# Patient Record
Sex: Male | Born: 1935 | Race: White | Hispanic: No | Smoking: Never smoker
Health system: Southern US, Community
[De-identification: ages and names within clinical notes are randomized; demographics above are authoritative.]

## PROBLEM LIST (undated history)

## (undated) DIAGNOSIS — E1149 Type 2 diabetes mellitus with other diabetic neurological complication: Secondary | ICD-10-CM

## (undated) DIAGNOSIS — I639 Cerebral infarction, unspecified: Secondary | ICD-10-CM

## (undated) DIAGNOSIS — I339 Acute and subacute endocarditis, unspecified: Secondary | ICD-10-CM

## (undated) DIAGNOSIS — A419 Sepsis, unspecified organism: Secondary | ICD-10-CM

## (undated) DIAGNOSIS — I2510 Atherosclerotic heart disease of native coronary artery without angina pectoris: Secondary | ICD-10-CM

## (undated) DIAGNOSIS — I679 Cerebrovascular disease, unspecified: Secondary | ICD-10-CM

## (undated) DIAGNOSIS — I509 Heart failure, unspecified: Secondary | ICD-10-CM

## (undated) DIAGNOSIS — M542 Cervicalgia: Secondary | ICD-10-CM

## (undated) DIAGNOSIS — I330 Acute and subacute infective endocarditis: Secondary | ICD-10-CM

## (undated) DIAGNOSIS — M4802 Spinal stenosis, cervical region: Secondary | ICD-10-CM

## (undated) DIAGNOSIS — K635 Polyp of colon: Secondary | ICD-10-CM

## (undated) DIAGNOSIS — F0390 Unspecified dementia without behavioral disturbance: Secondary | ICD-10-CM

## (undated) DIAGNOSIS — I10 Essential (primary) hypertension: Secondary | ICD-10-CM

## (undated) DIAGNOSIS — E119 Type 2 diabetes mellitus without complications: Secondary | ICD-10-CM

## (undated) DIAGNOSIS — K219 Gastro-esophageal reflux disease without esophagitis: Secondary | ICD-10-CM

## (undated) DIAGNOSIS — F329 Major depressive disorder, single episode, unspecified: Secondary | ICD-10-CM

## (undated) DIAGNOSIS — E611 Iron deficiency: Secondary | ICD-10-CM

## (undated) DIAGNOSIS — I4891 Unspecified atrial fibrillation: Secondary | ICD-10-CM

## (undated) DIAGNOSIS — N183 Chronic kidney disease, stage 3 unspecified: Secondary | ICD-10-CM

## (undated) DIAGNOSIS — B029 Zoster without complications: Secondary | ICD-10-CM

## (undated) DIAGNOSIS — K8050 Calculus of bile duct without cholangitis or cholecystitis without obstruction: Secondary | ICD-10-CM

## (undated) DIAGNOSIS — J449 Chronic obstructive pulmonary disease, unspecified: Secondary | ICD-10-CM

## (undated) DIAGNOSIS — Z8679 Personal history of other diseases of the circulatory system: Secondary | ICD-10-CM

## (undated) DIAGNOSIS — N433 Hydrocele, unspecified: Secondary | ICD-10-CM

## (undated) DIAGNOSIS — E785 Hyperlipidemia, unspecified: Secondary | ICD-10-CM

## (undated) DIAGNOSIS — E7800 Pure hypercholesterolemia, unspecified: Secondary | ICD-10-CM

## (undated) HISTORY — DX: Chronic obstructive pulmonary disease, unspecified: J44.9

## (undated) HISTORY — DX: Sepsis, unspecified organism: A41.9

## (undated) HISTORY — DX: Cervicalgia: M54.2

## (undated) HISTORY — DX: Zoster without complications: B02.9

## (undated) HISTORY — DX: Spinal stenosis, cervical region: M48.02

## (undated) HISTORY — DX: Polyp of colon: K63.5

## (undated) HISTORY — DX: Personal history of other diseases of the circulatory system: Z86.79

## (undated) HISTORY — DX: Essential (primary) hypertension: I10

## (undated) HISTORY — DX: Cerebral infarction, unspecified: I63.9

## (undated) HISTORY — DX: Gastro-esophageal reflux disease without esophagitis: K21.9

## (undated) HISTORY — DX: Type 2 diabetes mellitus with other diabetic neurological complication: E11.49

## (undated) HISTORY — DX: Pure hypercholesterolemia, unspecified: E78.00

## (undated) HISTORY — PX: HYDROCELE EXCISION: SHX482

## (undated) HISTORY — PX: CATARACT EXTRACTION: SUR2

## (undated) HISTORY — DX: Acute and subacute endocarditis, unspecified: I33.9

## (undated) HISTORY — DX: Chronic kidney disease, stage 3 unspecified: N18.30

## (undated) HISTORY — DX: Atherosclerotic heart disease of native coronary artery without angina pectoris: I25.10

## (undated) HISTORY — PX: CARDIAC SURGERY: SHX584

## (undated) HISTORY — DX: Calculus of bile duct without cholangitis or cholecystitis without obstruction: K80.50

## (undated) HISTORY — DX: Hydrocele, unspecified: N43.3

## (undated) HISTORY — DX: Cerebrovascular disease, unspecified: I67.9

---
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
## 1993-10-20 HISTORY — PX: CHOLECYSTECTOMY: SHX55

## 1995-10-21 HISTORY — PX: COLONOSCOPY: SHX174

## 1998-09-18 ENCOUNTER — Encounter: Payer: Self-pay | Admitting: Emergency Medicine

## 1998-09-18 ENCOUNTER — Inpatient Hospital Stay (HOSPITAL_COMMUNITY): Admission: EM | Admit: 1998-09-18 | Discharge: 1998-09-21 | Payer: Self-pay | Admitting: Emergency Medicine

## 1998-09-19 HISTORY — PX: PTCA: SHX146

## 1999-02-01 ENCOUNTER — Encounter: Admission: RE | Admit: 1999-02-01 | Discharge: 1999-02-01 | Payer: Self-pay | Admitting: Family Medicine

## 1999-08-14 ENCOUNTER — Encounter: Payer: Self-pay | Admitting: Cardiology

## 1999-08-14 ENCOUNTER — Ambulatory Visit (HOSPITAL_COMMUNITY): Admission: RE | Admit: 1999-08-14 | Discharge: 1999-08-14 | Payer: Self-pay | Admitting: Cardiology

## 2000-02-17 ENCOUNTER — Encounter: Admission: RE | Admit: 2000-02-17 | Discharge: 2000-02-17 | Payer: Self-pay | Admitting: Pediatrics

## 2000-04-29 ENCOUNTER — Emergency Department (HOSPITAL_COMMUNITY): Admission: EM | Admit: 2000-04-29 | Discharge: 2000-04-29 | Payer: Self-pay | Admitting: Emergency Medicine

## 2000-05-14 ENCOUNTER — Emergency Department (HOSPITAL_COMMUNITY): Admission: EM | Admit: 2000-05-14 | Discharge: 2000-05-14 | Payer: Self-pay | Admitting: Emergency Medicine

## 2000-11-04 ENCOUNTER — Encounter: Admission: RE | Admit: 2000-11-04 | Discharge: 2000-11-04 | Payer: Self-pay | Admitting: Family Medicine

## 2000-11-26 ENCOUNTER — Encounter: Payer: Self-pay | Admitting: Emergency Medicine

## 2000-11-26 ENCOUNTER — Inpatient Hospital Stay (HOSPITAL_COMMUNITY): Admission: EM | Admit: 2000-11-26 | Discharge: 2000-12-01 | Payer: Self-pay | Admitting: Emergency Medicine

## 2000-11-28 ENCOUNTER — Encounter: Payer: Self-pay | Admitting: Family Medicine

## 2000-11-30 ENCOUNTER — Encounter: Payer: Self-pay | Admitting: Family Medicine

## 2001-03-12 ENCOUNTER — Ambulatory Visit (HOSPITAL_COMMUNITY): Admission: RE | Admit: 2001-03-12 | Discharge: 2001-03-13 | Payer: Self-pay | Admitting: Cardiology

## 2001-04-29 ENCOUNTER — Encounter: Admission: RE | Admit: 2001-04-29 | Discharge: 2001-04-29 | Payer: Self-pay | Admitting: Family Medicine

## 2002-02-28 ENCOUNTER — Encounter: Admission: RE | Admit: 2002-02-28 | Discharge: 2002-02-28 | Payer: Self-pay | Admitting: Family Medicine

## 2002-11-21 ENCOUNTER — Encounter: Admission: RE | Admit: 2002-11-21 | Discharge: 2002-11-21 | Payer: Self-pay | Admitting: Family Medicine

## 2003-03-27 ENCOUNTER — Encounter: Admission: RE | Admit: 2003-03-27 | Discharge: 2003-03-27 | Payer: Self-pay | Admitting: Sports Medicine

## 2003-05-04 ENCOUNTER — Encounter: Payer: Self-pay | Admitting: Emergency Medicine

## 2003-05-04 ENCOUNTER — Inpatient Hospital Stay (HOSPITAL_COMMUNITY): Admission: EM | Admit: 2003-05-04 | Discharge: 2003-05-05 | Payer: Self-pay | Admitting: Emergency Medicine

## 2003-05-05 ENCOUNTER — Encounter: Payer: Self-pay | Admitting: Cardiology

## 2003-09-20 HISTORY — PX: OTHER SURGICAL HISTORY: SHX169

## 2003-10-05 ENCOUNTER — Inpatient Hospital Stay (HOSPITAL_COMMUNITY): Admission: EM | Admit: 2003-10-05 | Discharge: 2003-10-11 | Payer: Self-pay | Admitting: Emergency Medicine

## 2003-10-24 ENCOUNTER — Encounter: Admission: RE | Admit: 2003-10-24 | Discharge: 2003-10-24 | Payer: Self-pay | Admitting: Sports Medicine

## 2003-12-28 ENCOUNTER — Encounter: Admission: RE | Admit: 2003-12-28 | Discharge: 2003-12-28 | Payer: Self-pay | Admitting: Family Medicine

## 2004-02-01 ENCOUNTER — Encounter: Admission: RE | Admit: 2004-02-01 | Discharge: 2004-02-01 | Payer: Self-pay | Admitting: Family Medicine

## 2004-04-15 ENCOUNTER — Encounter: Admission: RE | Admit: 2004-04-15 | Discharge: 2004-04-15 | Payer: Self-pay | Admitting: Family Medicine

## 2004-04-23 ENCOUNTER — Encounter: Admission: RE | Admit: 2004-04-23 | Discharge: 2004-04-23 | Payer: Self-pay

## 2004-06-05 ENCOUNTER — Encounter: Admission: RE | Admit: 2004-06-05 | Discharge: 2004-06-05 | Payer: Self-pay | Admitting: Family Medicine

## 2004-10-01 ENCOUNTER — Ambulatory Visit: Payer: Self-pay

## 2005-04-04 ENCOUNTER — Ambulatory Visit: Payer: Self-pay | Admitting: Family Medicine

## 2005-06-10 ENCOUNTER — Ambulatory Visit: Payer: Self-pay | Admitting: Sports Medicine

## 2005-12-12 ENCOUNTER — Ambulatory Visit: Payer: Self-pay | Admitting: Family Medicine

## 2005-12-25 ENCOUNTER — Ambulatory Visit: Payer: Self-pay | Admitting: Family Medicine

## 2006-04-28 ENCOUNTER — Ambulatory Visit: Payer: Self-pay | Admitting: Cardiovascular Disease

## 2006-04-28 ENCOUNTER — Emergency Department (HOSPITAL_COMMUNITY): Admission: EM | Admit: 2006-04-28 | Discharge: 2006-04-28 | Payer: Self-pay | Admitting: Emergency Medicine

## 2006-05-01 ENCOUNTER — Encounter: Admission: RE | Admit: 2006-05-01 | Discharge: 2006-05-01 | Payer: Self-pay | Admitting: Neurosurgery

## 2006-05-07 ENCOUNTER — Ambulatory Visit: Payer: Self-pay

## 2006-05-07 ENCOUNTER — Encounter: Payer: Self-pay | Admitting: Internal Medicine

## 2006-05-19 ENCOUNTER — Ambulatory Visit: Payer: Self-pay | Admitting: Cardiology

## 2006-09-21 ENCOUNTER — Ambulatory Visit: Payer: Self-pay | Admitting: Sports Medicine

## 2006-09-28 ENCOUNTER — Ambulatory Visit: Payer: Self-pay | Admitting: Family Medicine

## 2006-12-17 DIAGNOSIS — E7800 Pure hypercholesterolemia, unspecified: Secondary | ICD-10-CM

## 2006-12-17 DIAGNOSIS — E118 Type 2 diabetes mellitus with unspecified complications: Secondary | ICD-10-CM

## 2006-12-17 DIAGNOSIS — I10 Essential (primary) hypertension: Secondary | ICD-10-CM | POA: Insufficient documentation

## 2006-12-17 DIAGNOSIS — E1140 Type 2 diabetes mellitus with diabetic neuropathy, unspecified: Secondary | ICD-10-CM

## 2006-12-17 DIAGNOSIS — Z794 Long term (current) use of insulin: Secondary | ICD-10-CM

## 2007-03-29 ENCOUNTER — Ambulatory Visit: Payer: Self-pay | Admitting: Family Medicine

## 2007-03-29 LAB — CONVERTED CEMR LAB: Hgb A1c MFr Bld: 7 %

## 2007-05-13 ENCOUNTER — Ambulatory Visit: Payer: Self-pay | Admitting: Cardiology

## 2007-05-26 ENCOUNTER — Encounter: Payer: Self-pay | Admitting: Cardiology

## 2007-05-26 ENCOUNTER — Ambulatory Visit: Payer: Self-pay | Admitting: Cardiology

## 2007-05-26 ENCOUNTER — Ambulatory Visit: Payer: Self-pay

## 2007-05-26 LAB — CONVERTED CEMR LAB
ALT: 15 units/L (ref 0–53)
AST: 23 units/L (ref 0–37)
Albumin: 3.8 g/dL (ref 3.5–5.2)
Alkaline Phosphatase: 49 units/L (ref 39–117)
BUN: 19 mg/dL (ref 6–23)
Bilirubin, Direct: 0.1 mg/dL (ref 0.0–0.3)
CO2: 28 meq/L (ref 19–32)
Calcium: 9.2 mg/dL (ref 8.4–10.5)
Chloride: 111 meq/L (ref 96–112)
Cholesterol: 118 mg/dL (ref 0–200)
Creatinine, Ser: 1.1 mg/dL (ref 0.4–1.5)
GFR calc Af Amer: 83 mL/min
GFR calc non Af Amer: 69 mL/min
Glucose, Bld: 104 mg/dL — ABNORMAL HIGH (ref 70–99)
HDL: 33.4 mg/dL — ABNORMAL LOW (ref >39.0)
LDL Cholesterol: 61 mg/dL (ref 0–99)
Potassium: 4.4 meq/L (ref 3.5–5.1)
Sodium: 144 meq/L (ref 135–145)
Total Bilirubin: 0.9 mg/dL (ref 0.3–1.2)
Total CHOL/HDL Ratio: 3.5
Total Protein: 6.7 g/dL (ref 6.0–8.3)
Triglycerides: 120 mg/dL (ref 0–149)
VLDL: 24 mg/dL (ref 0–40)

## 2007-05-31 ENCOUNTER — Encounter (INDEPENDENT_AMBULATORY_CARE_PROVIDER_SITE_OTHER): Payer: Self-pay | Admitting: *Deleted

## 2007-08-24 ENCOUNTER — Telehealth (INDEPENDENT_AMBULATORY_CARE_PROVIDER_SITE_OTHER): Payer: Self-pay | Admitting: *Deleted

## 2007-09-07 ENCOUNTER — Telehealth (INDEPENDENT_AMBULATORY_CARE_PROVIDER_SITE_OTHER): Payer: Self-pay | Admitting: *Deleted

## 2007-09-22 ENCOUNTER — Encounter (INDEPENDENT_AMBULATORY_CARE_PROVIDER_SITE_OTHER): Payer: Self-pay | Admitting: *Deleted

## 2007-09-22 ENCOUNTER — Ambulatory Visit: Payer: Self-pay | Admitting: Family Medicine

## 2007-09-22 LAB — CONVERTED CEMR LAB: Hgb A1c MFr Bld: 6.7 %

## 2007-09-23 LAB — CONVERTED CEMR LAB: Direct LDL: 106 mg/dL — ABNORMAL HIGH

## 2007-12-24 ENCOUNTER — Telehealth (INDEPENDENT_AMBULATORY_CARE_PROVIDER_SITE_OTHER): Payer: Self-pay | Admitting: *Deleted

## 2008-01-07 ENCOUNTER — Encounter (INDEPENDENT_AMBULATORY_CARE_PROVIDER_SITE_OTHER): Payer: Self-pay | Admitting: *Deleted

## 2008-01-07 ENCOUNTER — Ambulatory Visit: Payer: Self-pay | Admitting: Family Medicine

## 2008-01-07 LAB — CONVERTED CEMR LAB
ALT: 13 units/L (ref 0–53)
AST: 20 units/L (ref 0–37)
Albumin: 4.4 g/dL (ref 3.5–5.2)
Alkaline Phosphatase: 57 units/L (ref 39–117)
BUN: 15 mg/dL (ref 6–23)
CO2: 24 meq/L (ref 19–32)
Calcium: 9.6 mg/dL (ref 8.4–10.5)
Chloride: 106 meq/L (ref 96–112)
Creatinine, Ser: 1.03 mg/dL (ref 0.40–1.50)
Direct LDL: 64 mg/dL
Glucose, Bld: 115 mg/dL — ABNORMAL HIGH (ref 70–99)
Hgb A1c MFr Bld: 6.9 %
Potassium: 4.3 meq/L (ref 3.5–5.3)
Sodium: 141 meq/L (ref 135–145)
Total Bilirubin: 0.9 mg/dL (ref 0.3–1.2)
Total Protein: 7.3 g/dL (ref 6.0–8.3)

## 2008-01-10 ENCOUNTER — Encounter (INDEPENDENT_AMBULATORY_CARE_PROVIDER_SITE_OTHER): Payer: Self-pay | Admitting: *Deleted

## 2008-01-19 ENCOUNTER — Ambulatory Visit: Payer: Self-pay | Admitting: Family Medicine

## 2008-02-22 ENCOUNTER — Ambulatory Visit: Payer: Self-pay | Admitting: Family Medicine

## 2008-03-15 ENCOUNTER — Encounter (INDEPENDENT_AMBULATORY_CARE_PROVIDER_SITE_OTHER): Payer: Self-pay | Admitting: *Deleted

## 2008-05-05 ENCOUNTER — Ambulatory Visit: Payer: Self-pay | Admitting: Cardiology

## 2008-05-18 ENCOUNTER — Ambulatory Visit: Payer: Self-pay

## 2008-05-18 ENCOUNTER — Encounter: Payer: Self-pay | Admitting: Cardiology

## 2008-05-18 ENCOUNTER — Ambulatory Visit: Payer: Self-pay | Admitting: Cardiology

## 2008-05-18 LAB — CONVERTED CEMR LAB
ALT: 15 units/L (ref 0–53)
AST: 21 units/L (ref 0–37)
Albumin: 4 g/dL (ref 3.5–5.2)
Alkaline Phosphatase: 49 units/L (ref 39–117)
BUN: 17 mg/dL (ref 6–23)
Bilirubin, Direct: 0.1 mg/dL (ref 0.0–0.3)
CO2: 27 meq/L (ref 19–32)
Calcium: 9.1 mg/dL (ref 8.4–10.5)
Chloride: 105 meq/L (ref 96–112)
Cholesterol: 107 mg/dL (ref 0–200)
Creatinine, Ser: 1.1 mg/dL (ref 0.4–1.5)
GFR calc Af Amer: 83 mL/min
GFR calc non Af Amer: 69 mL/min
Glucose, Bld: 109 mg/dL — ABNORMAL HIGH (ref 70–99)
HDL: 32.9 mg/dL — ABNORMAL LOW (ref >39.0)
LDL Cholesterol: 50 mg/dL (ref 0–99)
Potassium: 4.4 meq/L (ref 3.5–5.1)
Sodium: 139 meq/L (ref 135–145)
Total Bilirubin: 0.9 mg/dL (ref 0.3–1.2)
Total CHOL/HDL Ratio: 3.3
Total Protein: 7 g/dL (ref 6.0–8.3)
Triglycerides: 122 mg/dL (ref 0–149)
VLDL: 24 mg/dL (ref 0–40)

## 2008-05-26 ENCOUNTER — Ambulatory Visit: Payer: Self-pay | Admitting: Cardiology

## 2008-06-22 ENCOUNTER — Ambulatory Visit: Payer: Self-pay | Admitting: Family Medicine

## 2008-06-22 ENCOUNTER — Encounter: Payer: Self-pay | Admitting: Family Medicine

## 2008-06-22 LAB — CONVERTED CEMR LAB
ALT: 13 units/L (ref 0–53)
AST: 18 units/L (ref 0–37)
Albumin: 4.4 g/dL (ref 3.5–5.2)
Alkaline Phosphatase: 57 units/L (ref 39–117)
BUN: 15 mg/dL (ref 6–23)
CO2: 24 meq/L (ref 19–32)
Calcium: 9.2 mg/dL (ref 8.4–10.5)
Chloride: 104 meq/L (ref 96–112)
Creatinine, Ser: 1.33 mg/dL (ref 0.40–1.50)
Glucose, Bld: 161 mg/dL — ABNORMAL HIGH (ref 70–99)
Hgb A1c MFr Bld: 6.9 %
Potassium: 4.3 meq/L (ref 3.5–5.3)
Sodium: 140 meq/L (ref 135–145)
Total Bilirubin: 0.7 mg/dL (ref 0.3–1.2)
Total Protein: 7.3 g/dL (ref 6.0–8.3)

## 2008-06-23 ENCOUNTER — Encounter: Payer: Self-pay | Admitting: Family Medicine

## 2008-09-12 ENCOUNTER — Encounter: Payer: Self-pay | Admitting: Family Medicine

## 2008-09-21 ENCOUNTER — Ambulatory Visit: Payer: Self-pay | Admitting: Family Medicine

## 2008-09-21 LAB — CONVERTED CEMR LAB: Hgb A1c MFr Bld: 6.9 %

## 2008-11-16 ENCOUNTER — Encounter: Payer: Self-pay | Admitting: Cardiology

## 2008-11-16 ENCOUNTER — Ambulatory Visit: Payer: Self-pay

## 2008-11-16 ENCOUNTER — Ambulatory Visit: Payer: Self-pay | Admitting: Cardiology

## 2008-11-22 ENCOUNTER — Ambulatory Visit: Payer: Self-pay | Admitting: Cardiology

## 2008-11-27 ENCOUNTER — Ambulatory Visit: Payer: Self-pay | Admitting: Vascular Surgery

## 2008-11-27 ENCOUNTER — Inpatient Hospital Stay (HOSPITAL_BASED_OUTPATIENT_CLINIC_OR_DEPARTMENT_OTHER): Admission: RE | Admit: 2008-11-27 | Discharge: 2008-11-27 | Payer: Self-pay | Admitting: Internal Medicine

## 2008-11-27 ENCOUNTER — Ambulatory Visit: Payer: Self-pay | Admitting: Internal Medicine

## 2008-11-27 ENCOUNTER — Encounter: Payer: Self-pay | Admitting: Internal Medicine

## 2008-11-27 ENCOUNTER — Ambulatory Visit (HOSPITAL_COMMUNITY): Admission: RE | Admit: 2008-11-27 | Discharge: 2008-11-27 | Payer: Self-pay | Admitting: Internal Medicine

## 2008-12-04 ENCOUNTER — Ambulatory Visit: Payer: Self-pay | Admitting: Thoracic Surgery (Cardiothoracic Vascular Surgery)

## 2008-12-07 ENCOUNTER — Ambulatory Visit: Payer: Self-pay | Admitting: Cardiology

## 2008-12-07 ENCOUNTER — Ambulatory Visit: Payer: Self-pay

## 2008-12-11 ENCOUNTER — Encounter: Payer: Self-pay | Admitting: Family Medicine

## 2008-12-18 ENCOUNTER — Ambulatory Visit: Payer: Self-pay | Admitting: Thoracic Surgery (Cardiothoracic Vascular Surgery)

## 2008-12-18 HISTORY — PX: CORONARY ARTERY BYPASS GRAFT: SHX141

## 2008-12-18 HISTORY — PX: AORTIC VALVE REPLACEMENT: SHX41

## 2008-12-22 ENCOUNTER — Ambulatory Visit: Payer: Self-pay | Admitting: Thoracic Surgery (Cardiothoracic Vascular Surgery)

## 2008-12-22 ENCOUNTER — Encounter: Payer: Self-pay | Admitting: Thoracic Surgery (Cardiothoracic Vascular Surgery)

## 2008-12-22 ENCOUNTER — Inpatient Hospital Stay (HOSPITAL_COMMUNITY)
Admission: RE | Admit: 2008-12-22 | Discharge: 2008-12-28 | Payer: Self-pay | Admitting: Thoracic Surgery (Cardiothoracic Vascular Surgery)

## 2008-12-22 ENCOUNTER — Ambulatory Visit: Payer: Self-pay | Admitting: Cardiology

## 2009-01-08 ENCOUNTER — Ambulatory Visit: Payer: Self-pay | Admitting: Cardiology

## 2009-01-08 ENCOUNTER — Encounter: Payer: Self-pay | Admitting: *Deleted

## 2009-01-22 ENCOUNTER — Ambulatory Visit: Payer: Self-pay | Admitting: Thoracic Surgery (Cardiothoracic Vascular Surgery)

## 2009-01-22 ENCOUNTER — Ambulatory Visit: Payer: Self-pay

## 2009-01-22 ENCOUNTER — Encounter: Admission: RE | Admit: 2009-01-22 | Discharge: 2009-01-22 | Payer: Self-pay | Admitting: *Deleted

## 2009-01-22 ENCOUNTER — Encounter: Payer: Self-pay | Admitting: Cardiology

## 2009-02-28 DIAGNOSIS — Z8673 Personal history of transient ischemic attack (TIA), and cerebral infarction without residual deficits: Secondary | ICD-10-CM

## 2009-03-06 ENCOUNTER — Ambulatory Visit: Payer: Self-pay | Admitting: Cardiology

## 2009-03-08 ENCOUNTER — Ambulatory Visit: Payer: Self-pay | Admitting: Cardiology

## 2009-03-08 LAB — CONVERTED CEMR LAB
ALT: 13 units/L (ref 0–53)
AST: 23 units/L (ref 0–37)
Albumin: 3.8 g/dL (ref 3.5–5.2)
Alkaline Phosphatase: 53 units/L (ref 39–117)
Bilirubin, Direct: 0 mg/dL (ref 0.0–0.3)
Cholesterol: 123 mg/dL (ref 0–200)
HDL: 41.3 mg/dL (ref >39.00)
LDL Cholesterol: 59 mg/dL (ref 0–99)
Total Bilirubin: 0.7 mg/dL (ref 0.3–1.2)
Total CHOL/HDL Ratio: 3
Total Protein: 6.8 g/dL (ref 6.0–8.3)
Triglycerides: 112 mg/dL (ref 0.0–149.0)
VLDL: 22.4 mg/dL (ref 0.0–40.0)

## 2009-03-16 ENCOUNTER — Ambulatory Visit: Payer: Self-pay | Admitting: Family Medicine

## 2009-03-27 ENCOUNTER — Telehealth: Payer: Self-pay | Admitting: *Deleted

## 2009-03-28 ENCOUNTER — Telehealth (INDEPENDENT_AMBULATORY_CARE_PROVIDER_SITE_OTHER): Payer: Self-pay | Admitting: *Deleted

## 2009-05-17 ENCOUNTER — Ambulatory Visit: Payer: Self-pay

## 2009-05-17 ENCOUNTER — Encounter: Payer: Self-pay | Admitting: Cardiology

## 2009-06-14 ENCOUNTER — Encounter (INDEPENDENT_AMBULATORY_CARE_PROVIDER_SITE_OTHER): Payer: Self-pay | Admitting: *Deleted

## 2009-09-06 ENCOUNTER — Ambulatory Visit: Payer: Self-pay | Admitting: Cardiology

## 2009-09-12 ENCOUNTER — Ambulatory Visit: Payer: Self-pay | Admitting: Cardiology

## 2009-09-18 ENCOUNTER — Encounter (INDEPENDENT_AMBULATORY_CARE_PROVIDER_SITE_OTHER): Payer: Self-pay | Admitting: *Deleted

## 2009-09-18 LAB — CONVERTED CEMR LAB
BUN: 16 mg/dL (ref 6–23)
CO2: 25 meq/L (ref 19–32)
Calcium: 9 mg/dL (ref 8.4–10.5)
Chloride: 109 meq/L (ref 96–112)
Creatinine, Ser: 1.6 mg/dL — ABNORMAL HIGH (ref 0.4–1.5)
GFR calc non Af Amer: 44.34 mL/min (ref >60)
Glucose, Bld: 177 mg/dL — ABNORMAL HIGH (ref 70–99)
Potassium: 5.1 meq/L (ref 3.5–5.1)
Sodium: 140 meq/L (ref 135–145)

## 2009-10-20 HISTORY — PX: OTHER SURGICAL HISTORY: SHX169

## 2009-11-07 ENCOUNTER — Encounter: Payer: Self-pay | Admitting: Cardiology

## 2009-11-07 ENCOUNTER — Ambulatory Visit: Payer: Self-pay

## 2009-12-11 ENCOUNTER — Telehealth: Payer: Self-pay | Admitting: Family Medicine

## 2009-12-12 ENCOUNTER — Ambulatory Visit: Payer: Self-pay | Admitting: Family Medicine

## 2009-12-12 LAB — CONVERTED CEMR LAB: Hgb A1c MFr Bld: 6.3 %

## 2009-12-18 DIAGNOSIS — N433 Hydrocele, unspecified: Secondary | ICD-10-CM

## 2009-12-18 HISTORY — DX: Hydrocele, unspecified: N43.3

## 2009-12-19 ENCOUNTER — Encounter: Payer: Self-pay | Admitting: Family Medicine

## 2010-01-01 ENCOUNTER — Encounter: Payer: Self-pay | Admitting: Family Medicine

## 2010-01-10 DIAGNOSIS — N138 Other obstructive and reflux uropathy: Secondary | ICD-10-CM | POA: Insufficient documentation

## 2010-01-10 DIAGNOSIS — N401 Enlarged prostate with lower urinary tract symptoms: Secondary | ICD-10-CM

## 2010-08-28 ENCOUNTER — Encounter: Payer: Self-pay | Admitting: Family Medicine

## 2010-08-28 ENCOUNTER — Ambulatory Visit: Payer: Self-pay | Admitting: Family Medicine

## 2010-08-28 LAB — CONVERTED CEMR LAB
BUN: 18 mg/dL (ref 6–23)
CO2: 22 meq/L (ref 19–32)
Calcium: 9.1 mg/dL (ref 8.4–10.5)
Chloride: 105 meq/L (ref 96–112)
Creatinine, Ser: 1.74 mg/dL — ABNORMAL HIGH (ref 0.40–1.50)
Glucose, Bld: 205 mg/dL — ABNORMAL HIGH (ref 70–99)
Potassium: 4.8 meq/L (ref 3.5–5.3)
Sodium: 136 meq/L (ref 135–145)

## 2010-09-03 ENCOUNTER — Encounter: Payer: Self-pay | Admitting: Cardiology

## 2010-09-03 ENCOUNTER — Ambulatory Visit: Payer: Self-pay | Admitting: Family Medicine

## 2010-09-03 ENCOUNTER — Ambulatory Visit: Payer: Self-pay | Admitting: Cardiology

## 2010-09-04 ENCOUNTER — Encounter (INDEPENDENT_AMBULATORY_CARE_PROVIDER_SITE_OTHER): Payer: Self-pay | Admitting: *Deleted

## 2010-09-04 LAB — CONVERTED CEMR LAB
ALT: 14 units/L (ref 0–53)
AST: 23 units/L (ref 0–37)
Albumin: 4.3 g/dL (ref 3.5–5.2)
Alkaline Phosphatase: 62 units/L (ref 39–117)
BUN: 20 mg/dL (ref 6–23)
Bilirubin, Direct: 0.1 mg/dL (ref 0.0–0.3)
CO2: 23 meq/L (ref 19–32)
Calcium: 9.2 mg/dL (ref 8.4–10.5)
Chloride: 103 meq/L (ref 96–112)
Cholesterol: 135 mg/dL (ref 0–200)
Creatinine, Ser: 2 mg/dL — ABNORMAL HIGH (ref 0.4–1.5)
Direct LDL: 59.9 mg/dL
GFR calc non Af Amer: 34.39 mL/min (ref >60)
Glucose, Bld: 128 mg/dL — ABNORMAL HIGH (ref 70–99)
HDL: 34.9 mg/dL — ABNORMAL LOW (ref >39.00)
Potassium: 4.8 meq/L (ref 3.5–5.1)
Sodium: 133 meq/L — ABNORMAL LOW (ref 135–145)
Total Bilirubin: 0.9 mg/dL (ref 0.3–1.2)
Total CHOL/HDL Ratio: 4
Total Protein: 7.2 g/dL (ref 6.0–8.3)
Triglycerides: 352 mg/dL — ABNORMAL HIGH (ref 0.0–149.0)
VLDL: 70.4 mg/dL — ABNORMAL HIGH (ref 0.0–40.0)

## 2010-09-24 ENCOUNTER — Ambulatory Visit: Payer: Self-pay | Admitting: Family Medicine

## 2010-09-24 DIAGNOSIS — N183 Chronic kidney disease, stage 3 (moderate): Secondary | ICD-10-CM

## 2010-09-24 DIAGNOSIS — H6120 Impacted cerumen, unspecified ear: Secondary | ICD-10-CM

## 2010-09-26 LAB — CONVERTED CEMR LAB
BUN: 18 mg/dL (ref 6–23)
CO2: 26 meq/L (ref 19–32)
Calcium: 9.3 mg/dL (ref 8.4–10.5)
Chloride: 103 meq/L (ref 96–112)
Creatinine, Ser: 1.8 mg/dL — ABNORMAL HIGH (ref 0.4–1.5)
GFR calc non Af Amer: 39.1 mL/min — ABNORMAL LOW (ref >60.00)
Glucose, Bld: 185 mg/dL — ABNORMAL HIGH (ref 70–99)
Potassium: 5.1 meq/L (ref 3.5–5.1)
Sodium: 137 meq/L (ref 135–145)

## 2010-10-31 ENCOUNTER — Other Ambulatory Visit: Payer: Self-pay | Admitting: Family Medicine

## 2010-10-31 ENCOUNTER — Ambulatory Visit
Admission: RE | Admit: 2010-10-31 | Discharge: 2010-10-31 | Payer: Self-pay | Source: Home / Self Care | Attending: Family Medicine | Admitting: Family Medicine

## 2010-11-01 LAB — BASIC METABOLIC PANEL
BUN: 19 mg/dL (ref 6–23)
CO2: 25 mEq/L (ref 19–32)
Calcium: 9.3 mg/dL (ref 8.4–10.5)
Chloride: 104 mEq/L (ref 96–112)
Creatinine, Ser: 1.8 mg/dL — ABNORMAL HIGH (ref 0.4–1.5)
GFR: 37.86 mL/min — ABNORMAL LOW (ref >60.00)
Glucose, Bld: 202 mg/dL — ABNORMAL HIGH (ref 70–99)
Potassium: 5.4 mEq/L — ABNORMAL HIGH (ref 3.5–5.1)
Sodium: 137 mEq/L (ref 135–145)

## 2010-11-06 ENCOUNTER — Ambulatory Visit
Admission: RE | Admit: 2010-11-06 | Discharge: 2010-11-06 | Payer: Self-pay | Source: Home / Self Care | Attending: Family Medicine | Admitting: Family Medicine

## 2010-11-06 ENCOUNTER — Other Ambulatory Visit: Payer: Self-pay | Admitting: Family Medicine

## 2010-11-06 LAB — BASIC METABOLIC PANEL
BUN: 16 mg/dL (ref 6–23)
CO2: 24 mEq/L (ref 19–32)
Calcium: 9.2 mg/dL (ref 8.4–10.5)
Chloride: 105 mEq/L (ref 96–112)
Creatinine, Ser: 1.7 mg/dL — ABNORMAL HIGH (ref 0.4–1.5)
GFR: 40.13 mL/min — ABNORMAL LOW (ref >60.00)
Glucose, Bld: 216 mg/dL — ABNORMAL HIGH (ref 70–99)
Potassium: 4.8 mEq/L (ref 3.5–5.1)
Sodium: 137 mEq/L (ref 135–145)

## 2010-11-17 LAB — CONVERTED CEMR LAB
BUN: 19 mg/dL (ref 6–23)
Basophils Absolute: 0 10*3/uL (ref 0.0–0.1)
Basophils Relative: 0.2 % (ref 0.0–3.0)
CO2: 26 meq/L (ref 19–32)
Calcium: 9 mg/dL (ref 8.4–10.5)
Chloride: 107 meq/L (ref 96–112)
Creatinine, Ser: 1.3 mg/dL (ref 0.4–1.5)
Eosinophils Absolute: 0.3 10*3/uL (ref 0.0–0.7)
Eosinophils Relative: 4 % (ref 0.0–5.0)
GFR calc Af Amer: 68 mL/min
GFR calc non Af Amer: 57 mL/min
Glucose, Bld: 201 mg/dL — ABNORMAL HIGH (ref 70–99)
HCT: 37.7 % — ABNORMAL LOW (ref 39.0–52.0)
Hemoglobin: 13.1 g/dL (ref 13.0–17.0)
INR: 0.9 (ref 0.8–1.0)
Lymphocytes Relative: 15.8 % (ref 12.0–46.0)
MCHC: 34.7 g/dL (ref 30.0–36.0)
MCV: 90.3 fL (ref 78.0–100.0)
Monocytes Absolute: 0.6 10*3/uL (ref 0.1–1.0)
Monocytes Relative: 8.3 % (ref 3.0–12.0)
Neutro Abs: 5 10*3/uL (ref 1.4–7.7)
Neutrophils Relative %: 71.7 % (ref 43.0–77.0)
Platelets: 175 10*3/uL (ref 150–400)
Potassium: 4.4 meq/L (ref 3.5–5.1)
Prothrombin Time: 10.1 s — ABNORMAL LOW (ref 10.9–13.3)
RBC: 4.17 M/uL — ABNORMAL LOW (ref 4.22–5.81)
RDW: 12.1 % (ref 11.5–14.6)
Sodium: 138 meq/L (ref 135–145)
WBC: 7 10*3/uL (ref 4.5–10.5)

## 2010-11-19 ENCOUNTER — Telehealth: Payer: Self-pay | Admitting: Family Medicine

## 2010-11-19 NOTE — Assessment & Plan Note (Signed)
Summary: DM f/u/[place]/ta   Allergies: No Known Drug Allergies   Complete Medication List: 1)  Bd Insulin Syringe U-100 1 Ml Misc (Insulin syringes (disposable)) .... 90 day supply given 2)  Famotidine 20 Mg Tabs (Famotidine) .Marland Kitchen.. 1 tablet by mouth once a day 3)  Glucotrol Xl 10 Mg Tb24 (Glipizide) .... Take  tablet by mouth once a day 4)  Lantus 100 Unit/ml Soln (Insulin glargine) .... Inject 25 unit subcutaneously every morning 5)  Metformin Hcl 1000 Mg Tabs (Metformin hcl) .... Take 1 tablet by mouth twice a day 6)  Nitroglycerin 0.4 Mg Subl (Nitroglycerin) .... Place 1 tablet under tongue 7)  Metoprolol Tartrate 50 Mg Tabs (Metoprolol tartrate) .Marland Kitchen.. 1 tab two times a day 8)  Ramipril 5 Mg Caps (Ramipril) .... Take one capsule by mouth daily 9)  Simvastatin 20 Mg Tabs (Simvastatin) .... Sig take 1 tab by mouth once daily 10)  Aspirin 81 Mg Tbec (Aspirin) .... Take one tablet by mouth daily  Other Orders: Basic Met[facility] [phone]) A1C-[facility] (86578)   Orders Added: 1)  Basic [facility] [[id number]] 2)  A1C[facility] [[id number]]  Appended Document: DM f/u/[place]/ta Appt rescheduled for 09/03/10. BMET and A1C drawn. Will folowup labs at 11/15 visit.  Doree Albee MD  Appended Document: A1c results  Laboratory Results   Blood Tests   Date/Time Received: November  9, 20111:50 PM  Date/Time Reported: August 28, 2010 2:21 PM   HGBA1C: 6.2%   (Normal Range: Non-Diabetic - 3-6%   Control Diabetic - 6-8%)  Comments: ...........test performed by...........Marland KitchenTerese Door, CMA

## 2010-11-19 NOTE — Consult Note (Signed)
Summary: [facility]   Imported By: Clydell Hakim 05/16/2010 16:26:31  _____________________________________________________________________  External Attachment:    Type:   Image     Comment:   External Document

## 2010-11-19 NOTE — Letter (Signed)
Summary: [facility] - Lipid  [facility], Main Office  [address]   [place], [place] [address]   Phone: [phone]  Fax: [phone]     September 04, 2010 MRN: [id number]   [address] [id number]  [place], [place]  [address]   Dear Mr. Jastrzebski,  We have reviewed your cholesterol results.  They are as follows:     Total Cholesterol:    135 (Desirable: less than 200)       HDL  Cholesterol:     34.90  (Desirable: greater than 40 for men and 50 for women)       LDL Cholesterol:       59  (Desirable: less than 100 for low risk and less than 70 for moderate to high risk)       Triglycerides:       352.0  (Desirable: less than 150)  Our recommendations include:These numbers look good. Continue on the same medicine. Sodium, potassium, kidney and Liver function are normal. Take care, Dr. Darel Hong.    Call our office at the number listed above if you have any questions.  Lowering your LDL cholesterol is important, but it is only one of a large number of "risk factors" that may indicate that you are at risk for heart disease, stroke or other complications of hardening of the arteries.  Other risk factors include:   A.  Cigarette Smoking* B.  High Blood Pressure* C.  Obesity* D.   Low HDL Cholesterol (see yours above)* E.   Diabetes Mellitus (higher risk if your is uncontrolled) F.  Family history of premature heart disease G.  Previous history of stroke or cardiovascular disease    *These are risk factors YOU HAVE CONTROL OVER.  For more information, visit .  There is now evidence that lowering the TOTAL CHOLESTEROL AND LDL CHOLESTEROL can reduce the risk of heart disease.  The American Heart Association recommends the following guidelines for the treatment of elevated cholesterol:  1.  If there is now current heart disease and less than two risk factors, TOTAL CHOLESTEROL should be less than 200 and LDL CHOLESTEROL should be  less than 100. 2.  If there is current heart disease or two or more risk factors, TOTAL CHOLESTEROL should be less than 200 and LDL CHOLESTEROL should be less than 70.  A diet low in cholesterol, saturated fat, and calories is the cornerstone of treatment for elevated cholesterol.  Cessation of smoking and exercise are also important in the management of elevated cholesterol and preventing vascular disease.  Studies have shown that 30 to 60 minutes of physical activity most days can help lower blood pressure, lower cholesterol, and keep your weight at a healthy level.  Drug therapy is used when cholesterol levels do not respond to therapeutic lifestyle changes (smoking cessation, diet, and exercise) and remains unacceptably high.  If medication is started, it is important to have you levels checked periodically to evaluate the need for further treatment options.  Thank you,    [employer] Team

## 2010-11-19 NOTE — Consult Note (Signed)
Summary: Dr Magnus Ivan: b/l hydrocele  Dr Magnus Ivan   Imported By: Bradly Bienenstock 12/31/2009 16:33:24  _____________________________________________________________________  External Attachment:    Type:   Image     Comment:   External Document

## 2010-11-19 NOTE — Assessment & Plan Note (Signed)
Summary: sore on arm/eo  PT LEFT WITHOUT BEING SEEN.Tessie Fass CMA  September 03, 2010 4:23 PM  Vitals Entered By: Tessie Fass CMA (September 03, 2010 4:23 PM)  Allergies: No Known Drug Allergies   Complete Medication List: 1)  Bd Insulin Syringe U-100 1 Ml Misc (Insulin syringes (disposable)) .... 90 day supply given 2)  Famotidine 20 Mg Tabs (Famotidine) .Marland Kitchen.. 1 tablet by mouth once a day 3)  Glucotrol Xl 10 Mg Tb24 (Glipizide) .... Take  tablet by mouth once a day 4)  Lantus 100 Unit/ml Soln (Insulin glargine) .... Inject 25 unit subcutaneously every morning 5)  Metformin Hcl 1000 Mg Tabs (Metformin hcl) .... Take 1 tablet by mouth twice a day 6)  Nitroglycerin 0.4 Mg Subl (Nitroglycerin) .... Place 1 tablet under tongue 7)  Altace 2.5 Mg Caps (Ramipril) .Marland Kitchen.. 1 tab by mouth once daily 8)  Aspirin 81 Mg Tbec (Aspirin) .... Take one tablet by mouth daily 9)  Simvastatin 40 Mg Tabs (Simvastatin) .... Take one tablet by mouth daily at bedtime  Other Orders: No Charge Patient Arrived (NCPA0) (NCPA0)   Orders Added: 1)  No Charge Patient Arrived (NCPA0) [NCPA0]  Appended Document: sore on arm/eo Pt left before being seen for appt.  Doree Albee MD

## 2010-11-19 NOTE — Miscellaneous (Signed)
Summary: Orders Update  Clinical Lists Changes  Orders: Added new Test order of Carotid Duplex (Carotid Duplex) - Signed 

## 2010-11-19 NOTE — Miscellaneous (Signed)
Summary: Changing Prob List    Clinical Lists Changes  Problems: Removed problem of INGUINAL HERNIA, LEFT (ICD-550.90) Removed problem of ENCOUNTER FOR LONG-TERM USE OF OTHER MEDICATIONS (ICD-V58.69) Removed problem of GRIEF REACTION, ACUTE (ICD-309.0) Removed problem of REFLUX ESOPHAGITIS (ICD-530.11) Removed problem of CHEST PAIN (ICD-786.50) Removed problem of HYDROCELES, BILATERAL (ICD-603.9) Removed problem of DYSPNEA (ICD-786.05) Removed problem of ANEMIA (ICD-285.9) Removed problem of AORTIC STENOSIS (ICD-424.1) Removed problem of AORTIC VALVE REPLACEMENT, HX OF (ICD-V43.3) Removed problem of ATRIAL ARRHYTHMIAS (ICD-427.89) Removed problem of CAROTID ARTERY NARROWING (ICD-433.10) Observations: Added new observation of PAST MED HX: Current Problems:  CORONARY, ARTERIOSCLEROSIS (ICD-414.00) CEREBROVASCULAR DISEASE (ICD-437.9) NEUROPATHY, DIABETIC (ICD-250.60) HYPERTENSION, BENIGN SYSTEMIC (ICD-401.1) HYPERCHOLESTEROLEMIA (ICD-272.0) DIABETES MELLITUS II, UNCOMPLICATED (ICD-250.00) GERD (ICD-530.81) Carotid Artery narrowing  BPH with urinary obstruction: [facility] 12/2009 Bilateral Hydrocele: [facility] 12/2009 History of aortic stenosis status post aortic valve replacement Cervicalgia - bil. Foramenal stenosis C4-C5, C5-C6  Quit tobacco 1996  (08/28/2010 20:04) Added new observation of PRIMARY MD: CAT TA MD (08/28/2010 20:04)       Past History:  Past Medical History: Current Problems:  CORONARY, ARTERIOSCLEROSIS (ICD-414.00) CEREBROVASCULAR DISEASE (ICD-437.9) NEUROPATHY, DIABETIC (ICD-250.60) HYPERTENSION, BENIGN SYSTEMIC (ICD-401.1) HYPERCHOLESTEROLEMIA (ICD-272.0) DIABETES MELLITUS II, UNCOMPLICATED (ICD-250.00) GERD (ICD-530.81) Carotid Artery narrowing  BPH with urinary obstruction: [facility] 12/2009 Bilateral Hydrocele: [facility] 12/2009 History of aortic stenosis status post aortic valve replacement Cervicalgia - bil.  Foramenal stenosis C4-C5, C5-C6  Quit tobacco 1996

## 2010-11-19 NOTE — Assessment & Plan Note (Signed)
Summary: hernia? per pt/[place]/Ta   Vital Signs:  Patient profile:   77 year old male Height:      69 inches Weight:      209.5 pounds Pulse rate:   66 / minute BP sitting:   138 / 76  (left arm) Cuff size:   large  Vitals Entered By: Arlyss Repress CMA, (December 12, 2009 10:49 AM) CC: swelling of scrotum off and on.  Is Patient Diabetic? Yes Pain Assessment Patient in pain? no        Primary Care Provider:  CAT TA MD  CC:  swelling of scrotum off and on. .  History of Present Illness: Patient presents with complaint of intermittent scrotal swelling, worse on the L than R.  Largely positional, worse when he stands up. No constipation, has daily soft formed BM.  No nausea or vomiting.   No dysuria or urinary hesitancy.   Patient surgical history reviewed in EMR, confirmed.   Current Medications (verified): 1)  Bd Insulin Syringe U-100 1 Ml  Misc (Insulin Syringes (Disposable)) .... 90 Day Supply Given 2)  Famotidine 20 Mg Tabs (Famotidine) .Marland Kitchen.. 1 Tablet By Mouth Once A Day 3)  Glucotrol Xl 10 Mg Tb24 (Glipizide) .... Take  Tablet By Mouth Once A Day 4)  Lantus 100 Unit/ml Soln (Insulin Glargine) .... Inject 25 Unit Subcutaneously Every Morning 5)  Metformin Hcl 1000 Mg Tabs (Metformin Hcl) .... Take 1 Tablet By Mouth Twice A Day 6)  Nitroglycerin 0.4 Mg Subl (Nitroglycerin) .... Place 1 Tablet Under Tongue 7)  Metoprolol Tartrate 50 Mg Tabs (Metoprolol Tartrate) .Marland Kitchen.. 1 Tab Two Times A Day 8)  Ramipril 5 Mg Caps (Ramipril) .... Take One Capsule By Mouth Daily 9)  Simvastatin 20 Mg Tabs (Simvastatin) .... Sig Take 1 Tab By Mouth Once Daily 10)  Aspirin 81 Mg Tbec (Aspirin) .... Take One Tablet By Mouth Daily  Allergies (verified): No Known Drug Allergies  Past History:  Past Medical History: Last updated: 09/06/2009 Current Problems:  CORONARY, ARTERIOSCLEROSIS (ICD-414.00) CEREBROVASCULAR DISEASE (ICD-437.9) NEUROPATHY, DIABETIC (ICD-250.60) HYPERTENSION, BENIGN  SYSTEMIC (ICD-401.1) HYPERCHOLESTEROLEMIA (ICD-272.0) DIABETES MELLITUS II, UNCOMPLICATED (ICD-250.00) ANEMIA (ICD-285.9) GERD (ICD-530.81) REFLUX ESOPHAGITIS (ICD-530.11) History of aortic stenosis status post aortic valve replacement  Cervicalgia - bil. Foramenal stenosis C4-C5, C5-C6 Creatinine 1.2 (11/2005) quit tobacco 1996  Past Surgical History: Last updated: 03/06/2009 Cataract surgery    Cath 5/02: 40%L main, 40% LAD, <20% narrowing of stent site, 70% circ, total occlusion of RCA, nl LV fxn, mod AS - 02/17/2001,   Cholecystectomy -,   colonoscopy 97-stark, mult polyps -, EF=60%, mild AV stenosis - 04/19/2006, ERCP -   sphincterectomy for jaundice - 09/20/2003, ptca 12/99 w/stent   3/10  Coronary artery bypass grafting x3 using a left internal mammary  artery to the left anterior descending coronary artery, saphenous vein graft to circumflex marginal branch, saphenous vein graft to posterior descending coronary artery.  Endoscopic saphenous vein harvest from bilateral thighs was done.  S/P AVR with pericarial tissue valve     Social History: Last updated: 02/28/2009 The patient is widowed and lives with 2 sons and one of   his daughters here in [place].  He has been retired for many years.   He remains fairly active physically with his only limitation that being   related to exertional shortness of breath.  He has a previous history of   heavy tobacco abuse, although he quit smoking in 1995.  He denies any   alcohol consumption.  Physical Exam  General:  generally well appearing, no apparent distress Mouth:  Oral mucosa and oropharynx without lesions or exudates.  Teeth in good repair. Neck:  No deformities, masses, or tenderness noted. Lungs:  Normal respiratory effort, chest expands symmetrically. Lungs are clear to auscultation, no crackles or wheezes. Heart:  regular S1 S2 Abdomen:  soft, nontender, nondistended. Normal bowel sounds.  No masses noted.    Bilateral inguinal hernia noted with valsalva; worse on the L than R. Genitalia:  no testicular masses.  No urethral meatus erythema or discharge.    Impression & Recommendations:  Problem # 1:  INGUINAL HERNIA, LEFT (ICD-550.90) Inguinal hernia by exam, history. Scrotal support, referral to general surgery.   Orders: Surgical Referral (Surgery) [facility]- Est Level  3 ([id number])  Complete Medication List: 1)  Bd Insulin Syringe U-100 1 Ml Misc (Insulin syringes (disposable)) .... 90 day supply given 2)  Famotidine 20 Mg Tabs (Famotidine) .Marland Kitchen.. 1 tablet by mouth once a day 3)  Glucotrol Xl 10 Mg Tb24 (Glipizide) .... Take  tablet by mouth once a day 4)  Lantus 100 Unit/ml Soln (Insulin glargine) .... Inject 25 unit subcutaneously every morning 5)  Metformin Hcl 1000 Mg Tabs (Metformin hcl) .... Take 1 tablet by mouth twice a day 6)  Nitroglycerin 0.4 Mg Subl (Nitroglycerin) .... Place 1 tablet under tongue 7)  Metoprolol Tartrate 50 Mg Tabs (Metoprolol tartrate) .Marland Kitchen.. 1 tab two times a day 8)  Ramipril 5 Mg Caps (Ramipril) .... Take one capsule by mouth daily 9)  Simvastatin 20 Mg Tabs (Simvastatin) .... Sig take 1 tab by mouth once daily 10)  Aspirin 81 Mg Tbec (Aspirin) .... Take one tablet by mouth daily  Other Orders: A1C[facility] ([id number])  Patient Instructions: 1)  It was a pleasure to see you today.;  I believe the scrotal swelling is caused by an inguinal hernia.  2)  I would like for you to see a general surgeon.  3)  In the meantime, I recommend using supportive briefs, and avoid getting constipated.  Coughing, constipation, and being on your feet for a long time (as well as lifting) should be avoided.  Laboratory Results   Blood Tests   Date/Time Received: December 12, 2009 10:55 AM  Date/Time Reported: December 12, 2009 11:21 AM   HGBA1C: 6.3%   (Normal Range: Non-Diabetic - 3-6%   Control Diabetic - 6-8%)  Comments: ...........test performed by...........Marland KitchenTerese Door,  CMA

## 2010-11-19 NOTE — Assessment & Plan Note (Signed)
Summary: NEW PT TO EST/CLE   Vital Signs:  Patient profile:   77 year old male Height:      69 inches Weight:      207 pounds Temp:     98.2 degrees F oral Pulse rate:   60 / minute Pulse rhythm:   regular BP sitting:   124 / 64  (left arm) Cuff size:   large  Vitals Entered By: Selena Batten Dance CMA Duncan Dull) (September 24, 2010 2:02 PM) CC: New patient to establish care   History of Present Illness: CC: transfer care from [facility]  Open heart surgery 2009.  Done very well since.  Saw Dr. Jens Som, released for 1 year f/u.  No CP/tightness, SOB.  DM - on lantus 25u at bedtime and metformin 1000 two times a day, glipizide daily.  Last foot exam a year ago.  Had vision check [facility] 06/2010, told looked good.  good control.  had foot exam by podiatrist several years ago last.  gets nails cut at nail salon.  Kidneys - Cr slowly creeping up over last year, to 2.0 09/03/2010.  does take NSAIDs pretty regularly for arthritis.  On ramipril 2.5 daily.  h/o arthritis - "aches and pains".  neck and shoulder pain, L leg pain.  takes advil 2-6 pills/day 200mg .    preventative: declines flu shot - will think about it. has had PNA shot 2002. tetanus given 2002. will think about shingles shot.  hearing ok, seeing ok.  No trouble hearing in crowded room.  has dentures in.  No stool changes, BM changes, no blood in stool. No urinary changes, nocturia x1-3 on average.  doesn't notice any change in stream.  Current Medications (verified): 1)  Aspirin 81 Mg Tbec (Aspirin) .... Take One Tablet By Mouth Daily 2)  Famotidine 20 Mg Tabs (Famotidine) .Marland Kitchen.. 1 Tablet By Mouth Once A Day 3)  Glucotrol Xl 10 Mg Tb24 (Glipizide) .... Take  Tablet By Mouth Once A Day 4)  Lantus 100 Unit/ml Soln (Insulin Glargine) .... Inject 25 Unit Subcutaneously Every Evening 5)  Metformin Hcl 1000 Mg Tabs (Metformin Hcl) .... Take 1 Tablet By Mouth Twice A Day 6)  Nitroglycerin 0.4 Mg Subl (Nitroglycerin) ....  Place 1 Tablet Under Tongue 7)  Altace 2.5 Mg Caps (Ramipril) .Marland Kitchen.. 1 Tab By Mouth Once Daily 8)  Simvastatin 40 Mg Tabs (Simvastatin) .... Take One Tablet By Mouth Daily At Bedtime 9)  Bd Insulin Syringe U-100 1 Ml  Misc (Insulin Syringes (Disposable)) .... 90 Day Supply Given  Allergies (verified): No Known Drug Allergies  Past History:  Family History: Last updated: 02/28/2009 father - mi 76 mother - breast ca 75 sister - brain ca  Past Medical History: CORONARY, ARTERIOSCLEROSIS (ICD-414.00) CEREBROVASCULAR DISEASE (ICD-437.9)- pt denies HYPERTENSION, BENIGN SYSTEMIC (ICD-401.1) HYPERCHOLESTEROLEMIA (ICD-272.0) DIABETES MELLITUS II, UNCOMPLICATED (ICD-250.00)     NEUROPATHY, DIABETIC (ICD-250.60) GERD (ICD-530.81) BPH with urinary obstruction: [facility] 12/2009 Bilateral Hydrocele: [facility] 12/2009 History of aortic stenosis status post aortic valve replacement Cervicalgia - bil. Foramenal stenosis C4-C5, C5-C6  Past Surgical History: B Cataract surgery 1990s Cholecystectomy 1995 colonoscopy 97-[facility], mult polyps sphincterectomy for jaundice - 09/20/2003, ptca 12/99 w/stent  CABG x3 using a left internal mammary  artery to the left anterior descending coronary artery, saphenous vein graft to circumflex marginal branch, saphenous vein graft to posterior descending coronary artery.  Endoscopic saphenous vein harvest from bilateral thighs was done. s/p AVR with pericarial tissue valve 12/2008 carotid US -  B ICA stenosis, stable disease, rec rpt 2 years 10/2009 B hydroceles [[facility]]  Social History: Smoking - quit 1995.  occasional EtOH Caffeine: 2-3 cups coffee. Lives with son.   Occupation: retired, [employer] architect. The patient is widowed and lives with 2 sons and one of his daughters here in [place].  He has been retired for many years.  He remains fairly active physically with his only limitation that being related to exertional shortness of  breath.  He has a previous history of heavy tobacco abuse, although he quit smoking in 1995.  He denies any alcohol consumption.  Review of Systems  The patient denies anorexia, fever, weight loss, weight gain, vision loss, decreased hearing, hoarseness, chest pain, syncope, dyspnea on exertion, peripheral edema, prolonged cough, headaches, hemoptysis, abdominal pain, melena, hematochezia, severe indigestion/heartburn, hematuria, difficulty walking, and depression.    Physical Exam  General:  generally well appearing, no apparent distress Head:  Normocephalic and atraumatic without obvious abnormalities.  Eyes:  No corneal or conjunctival inflammation noted. EOMI. Perrla.  Ears:  TMs covered by cerumen bilaterally Nose:  nares clear Mouth:   MMM, + dentures upper and lower.  no pharyngeal erythema Neck:  No deformities, masses, or tenderness noted. Lungs:  Normal respiratory effort, chest expands symmetrically. Lungs are clear to auscultation, no crackles or wheezes. Heart:  regular S1 S2, no m/r/g Abdomen:  soft, nontender, nondistended, NABS Pulses:  2+ rad pulses Extremities:  no pedal edema Neurologic:  CN grossly intact, station and gait intact Skin:  Intact without suspicious lesions or rashes Psych:  full affect, pleasant and conversant  Diabetes Management Exam:    Foot Exam (with socks and/or shoes not present):       Sensory-Pinprick/Light touch:          Left medial foot (L-4): normal          Left dorsal foot (L-5): normal          Left lateral foot (S-1): normal          Right medial foot (L-4): normal          Right dorsal foot (L-5): normal          Right lateral foot (S-1): normal       Sensory-Monofilament:          Left foot: normal          Right foot: normal       Inspection:          Left foot: abnormal             Comments: lateral deviation bilateral toes          Right foot: abnormal             Comments: lateral deviation bilateral toes       Nails:           Left foot: normal          Right foot: normal   Impression & Recommendations:  Problem # 1:  HYPERTENSION, BENIGN SYSTEMIC (ICD-401.1) good control, likely not an issue.    His updated medication list for this problem includes:    Altace 2.5 Mg Caps (Ramipril) .Marland Kitchen... 1 tab by mouth once daily  Orders: TLB-BMP (Basic Metabolic Panel-BMET) (80048-METABOL)  BP today: 124/64 Prior BP: 128/75 (09/03/2010)  Labs Reviewed: K+: 4.8 (09/03/2010) Creat: : 2.0 (09/03/2010)   Chol: 135 (09/03/2010)   HDL: 34.90 (09/03/2010)   LDL: 59 (03/08/2009)   TG:  352.0 (09/03/2010)  Problem # 2:  RENAL INSUFFICIENCY (ICD-588.9) Cr has been creeping up.  discussed NSAIDs and negative effect on kidneys.  advised to stop and only use tylenol.  check today.  Orders: TLB-BMP (Basic Metabolic Panel-BMET) (80048-METABOL)  Problem # 3:  AORTIC VALVE REPLACEMENT, HX OF (ICD-V43.3) needs SBE prophylaxis.   Problem # 4:  DIABETES MELLITUS II, UNCOMPLICATED (ICD-250.00) Assessment: Improved good control as evidenced by A1c last 6.2%.  continue meds.  utd vision.  diabetic foot exam today.  gets nails trimmed at salon.  His updated medication list for this problem includes:    Aspirin 81 Mg Tbec (Aspirin) .Marland Kitchen... Take one tablet by mouth daily    Glucotrol Xl 10 Mg Tb24 (Glipizide) .Marland Kitchen... Take  tablet by mouth once a day    Lantus 100 Unit/ml Soln (Insulin glargine) ..... Inject 25 unit subcutaneously every evening    Metformin Hcl 1000 Mg Tabs (Metformin hcl) .Marland Kitchen... Take 1 tablet by mouth twice a day    Altace 2.5 Mg Caps (Ramipril) .Marland Kitchen... 1 tab by mouth once daily  Labs Reviewed: Creat: 2.0 (09/03/2010)   Microalbumin: 1+ (06/22/2008) Reviewed HgBA1c results: 6.2 (08/28/2010)  6.3 (12/12/2009)  Problem # 5:  HYPERCHOLESTEROLEMIA (ICD-272.0) continue med.  good control with simvastatin.  His updated medication list for this problem includes:    Simvastatin 40 Mg Tabs (Simvastatin) .Marland Kitchen... Take one  tablet by mouth daily at bedtime  Labs Reviewed: SGOT: 23 (09/03/2010)   SGPT: 14 (09/03/2010)   HDL:34.90 (09/03/2010), 41.30 (03/08/2009)  LDL:59 (03/08/2009), 50 (05/18/2008)  Chol:135 (09/03/2010), 123 (03/08/2009)  Trig:352.0 (09/03/2010), 112.0 (03/08/2009)  Problem # 6:  CERUMEN IMPACTION (ICD-380.4) possibly affecting hearing.  trial of debrox.  Complete Medication List: 1)  Aspirin 81 Mg Tbec (Aspirin) .... Take one tablet by mouth daily 2)  Famotidine 20 Mg Tabs (Famotidine) .Marland Kitchen.. 1 tablet by mouth once a day 3)  Glucotrol Xl 10 Mg Tb24 (Glipizide) .... Take  tablet by mouth once a day 4)  Lantus 100 Unit/ml Soln (Insulin glargine) .... Inject 25 unit subcutaneously every evening 5)  Metformin Hcl 1000 Mg Tabs (Metformin hcl) .... Take 1 tablet by mouth twice a day 6)  Nitroglycerin 0.4 Mg Subl (Nitroglycerin) .... Place 1 tablet under tongue 7)  Altace 2.5 Mg Caps (Ramipril) .Marland Kitchen.. 1 tab by mouth once daily 8)  Simvastatin 40 Mg Tabs (Simvastatin) .... Take one tablet by mouth daily at bedtime 9)  Bd Insulin Syringe U-100 1 Ml Misc (Insulin syringes (disposable)) .... 90 day supply given 10)  Debrox 6.5 % Soln (Carbamide peroxide) .... Use as directed  Patient Instructions: 1)  Please return in 1 month for follow up kidney function. 2)  Stop Advil.  May take ES tylenol 500mg  1-2 three times a day.  Also do ice/heat pad to joints when painful. 3)  Think about flu shot 4)  Think about shingles shot and read up on it, may need to call medicare to ensure covered. 5)  Good to meet you today, call clinic with quesitons. 6)  Debrox for ears - instill 5 drops of liquid into ears and place cotton ball in ear to ensure stays in, may do for 4 days at a time.   Orders Added: 1)  TLB-BMP (Basic Metabolic Panel-BMET) [80048-METABOL] 2)  New Patient Level III [16109]    Current Allergies (reviewed today): No known allergies    Prevention & Chronic Care Immunizations   Influenza  vaccine: Not documented   Influenza vaccine  due: Refused  (09/21/2008)    Tetanus booster: 11/21/2000: Done.   Tetanus booster due: 11/21/2010    Pneumococcal vaccine: Done.  (11/21/2000)   Pneumococcal vaccine deferral: Not indicated  (09/24/2010)   Pneumococcal vaccine due: None    H. zoster vaccine: Not documented  Colorectal Screening   Hemoccult: Done.  (12/18/2005)   Hemoccult due: Not Indicated    Colonoscopy: Not documented   Colonoscopy due: Not Indicated  Other Screening   PSA: Not documented   PSA due due: Not Indicated   Smoking status: quit  (03/16/2009)  Diabetes Mellitus   HgbA1C: 6.2  (08/28/2010)   Hemoglobin A1C due: 04/08/2008    Eye exam: Not documented    Foot exam: yes  (09/24/2010)   High risk foot: Not documented   Foot care education: Not documented   Foot exam due: 01/06/2009    Urine microalbumin/creatinine ratio: Not documented   Urine microalbumin/cr due: 06/22/2009  Lipids   Total Cholesterol: 135  (09/03/2010)   LDL: 59  (03/08/2009)   LDL Direct: 59.9  (09/03/2010)   HDL: 34.90  (09/03/2010)   Triglycerides: 352.0  (09/03/2010)    SGOT (AST): 23  (09/03/2010)   SGPT (ALT): 14  (09/03/2010)   Alkaline phosphatase: 62  (09/03/2010)   Total bilirubin: 0.9  (09/03/2010)  Hypertension   Last Blood Pressure: 124 / 64  (09/24/2010)   Serum creatinine: 2.0  (09/03/2010)   Serum potassium 4.8  (09/03/2010)  Self-Management Support :    Diabetes self-management support: Not documented    Hypertension self-management support: Not documented    Lipid self-management support: Not documented

## 2010-11-19 NOTE — Assessment & Plan Note (Signed)
Summary: F1Y/DM  Medications Added ALTACE 2.5 MG CAPS (RAMIPRIL) 1 tab by mouth once daily SIMVASTATIN 40 MG TABS (SIMVASTATIN) Take one tablet by mouth daily at bedtime      Allergies Added: NKDA  Primary Provider:  CAT TA MD  CC:  back pain.  History of Present Illness: [name] is a pleasant gentleman who I have followed for aortic stenosis and coronary artery disease. His previous workup showed three-vessel coronary disease and severe aortic stenosis.  The patient therefore on December 22 2008, underwent aortic valve replacement with #25-mm [facility] Ease pericardial tissue valve as well as coronary artery bypassing graft with a LIMA to the LAD, saphenous vein graft to the circ marginal and the saphenous vein graft to the PDA.  His postoperative course was complicated somewhat by mild confusion.  He also had transient SVT and was treated with amiodarone. Post operative echocardiogram in April, 2010 showed normal LV function, a well-functioning aortic valve with a mean gradient of 6 mm mercury, and mild mitral regurgitation. He did have repeat carotid Dopplers performed in Jan 2011 and showed a  40-59% bilateral carotid stenosis. F/U recommended in 2 years. Since I last saw him in Nov 2010 he has dyspnea with more extreme activities but not with routine activities. It is relieved with rest. It is not associated with chest pain. There is no orthopnea, PND, pedal edema, palpitations or syncope.  Current Medications (verified): 1)  Bd Insulin Syringe U-100 1 Ml  Misc (Insulin Syringes (Disposable)) .... 90 Day Supply Given 2)  Famotidine 20 Mg Tabs (Famotidine) .Marland Kitchen.. 1 Tablet By Mouth Once A Day 3)  Glucotrol Xl 10 Mg Tb24 (Glipizide) .... Take  Tablet By Mouth Once A Day 4)  Lantus 100 Unit/ml Soln (Insulin Glargine) .... Inject 25 Unit Subcutaneously Every Morning 5)  Metformin Hcl 1000 Mg Tabs (Metformin Hcl) .... Take 1 Tablet By Mouth Twice A Day 6)  Nitroglycerin 0.4 Mg Subl  (Nitroglycerin) .... Place 1 Tablet Under Tongue 7)  Altace 2.5 Mg Caps (Ramipril) .Marland Kitchen.. 1 Tab By Mouth Once Daily 8)  Aspirin 81 Mg Tbec (Aspirin) .... Take One Tablet By Mouth Daily 9)  Simvastatin 40 Mg Tabs (Simvastatin) .... Take One Tablet By Mouth Daily At Bedtime  Allergies (verified): No Known Drug Allergies  Past History:  Past Medical History: CORONARY, ARTERIOSCLEROSIS (ICD-414.00) CEREBROVASCULAR DISEASE (ICD-437.9) NEUROPATHY, DIABETIC (ICD-250.60) HYPERTENSION, BENIGN SYSTEMIC (ICD-401.1) HYPERCHOLESTEROLEMIA (ICD-272.0) DIABETES MELLITUS II, UNCOMPLICATED (ICD-250.00) GERD (ICD-530.81) BPH with urinary obstruction: [facility] 12/2009 Bilateral Hydrocele: [facility] 12/2009 History of aortic stenosis status post aortic valve replacement Cervicalgia - bil. Foramenal stenosis C4-C5, C5-C6  Past Surgical History: Cataract surgery   Cholecystectomy -,   colonoscopy 97-[facility], mult polyps   sphincterectomy for jaundice - 09/20/2003, ptca 12/99 w/stent   3/10  Coronary artery bypass grafting x3 using a left internal mammary  artery to the left anterior descending coronary artery, saphenous vein graft to circumflex marginal branch, saphenous vein graft to posterior descending coronary artery.  Endoscopic saphenous vein harvest from bilateral thighs was done.  S/P AVR with pericarial tissue valve     Social History: Reviewed history from 02/28/2009 and no changes required. The patient is widowed and lives with 2 sons and one of   his daughters here in [place].  He has been retired for many years.   He remains fairly active physically with his only limitation that being   related to exertional shortness of breath.  He has a previous history  of   heavy tobacco abuse, although he quit smoking in 1995.  He denies any   alcohol consumption.      Review of Systems       Some arthralgias but no fevers or chills, productive cough, hemoptysis, dysphasia,  odynophagia, melena, hematochezia, dysuria, hematuria, rash, seizure activity, orthopnea, PND, pedal edema, claudication. Remaining systems are negative.   Vital Signs:  Patient profile:   77 year old male Height:      69 inches Weight:      207 pounds BMI:     30.68 Pulse rate:   61 / minute Resp:     14 per minute BP sitting:   128 / 75  (left arm)  Vitals Entered By: Kem Parkinson (September 03, 2010 2:00 PM)  Physical Exam  General:  Well-developed well-nourished in no acute distress.  Skin is warm and dry.  HEENT is normal.  Neck is supple. No thyromegaly.  Chest is clear to auscultation with normal expansion.  Cardiovascular exam is regular rate and rhythm. 2/6 systolic murmur left sternal border. No diastolic murmur noted. Abdominal exam nontender or distended. No masses palpated. Extremities show no edema. neuro grossly intact    EKG  Procedure date:  09/03/2010  Findings:      Sinus rhythm, left anterior fascicular block, cannot rule out prior anterior infarct.  Impression & Recommendations:  Problem # 1:  HYPERTENSION, BENIGN SYSTEMIC (ICD-401.1)  Blood pressure controlled. Check potassium and renal function. The following medications were removed from the medication list:    Metoprolol Tartrate 50 Mg Tabs (Metoprolol tartrate) .Marland Kitchen... 1 tab two times a day His updated medication list for this problem includes:    Altace 2.5 Mg Caps (Ramipril) .Marland Kitchen... 1 tab by mouth once daily    Aspirin 81 Mg Tbec (Aspirin) .Marland Kitchen... Take one tablet by mouth daily  Orders: TLB-BMP (Basic Metabolic Panel-BMET) (80048-METABOL)  The following medications were removed from the medication list:    Metoprolol Tartrate 50 Mg Tabs (Metoprolol tartrate) .Marland Kitchen... 1 tab two times a day His updated medication list for this problem includes:    Altace 2.5 Mg Caps (Ramipril) .Marland Kitchen... 1 tab by mouth once daily    Aspirin 81 Mg Tbec (Aspirin) .Marland Kitchen... Take one tablet by mouth daily  Problem # 2:   HYPERCHOLESTEROLEMIA (ICD-272.0) Continue statin. Check lipids and liver. The following medications were removed from the medication list:    Simvastatin 20 Mg Tabs (Simvastatin) ..... Sig take 1 tab by mouth once daily His updated medication list for this problem includes:    Simvastatin 40 Mg Tabs (Simvastatin) .Marland Kitchen... Take one tablet by mouth daily at bedtime  Orders: TLB-Lipid Panel (80061-LIPID) TLB-Hepatic/Liver Function Pnl (80076-HEPATIC)  The following medications were removed from the medication list:    Simvastatin 20 Mg Tabs (Simvastatin) ..... Sig take 1 tab by mouth once daily His updated medication list for this problem includes:    Simvastatin 40 Mg Tabs (Simvastatin) .Marland Kitchen... Take one tablet by mouth daily at bedtime  Problem # 3:  DIABETES MELLITUS II, UNCOMPLICATED (ICD-250.00)  His updated medication list for this problem includes:    Glucotrol Xl 10 Mg Tb24 (Glipizide) .Marland Kitchen... Take  tablet by mouth once a day    Lantus 100 Unit/ml Soln (Insulin glargine) ..... Inject 25 unit subcutaneously every morning    Metformin Hcl 1000 Mg Tabs (Metformin hcl) .Marland Kitchen... Take 1 tablet by mouth twice a day    Altace 2.5 Mg Caps (Ramipril) .Marland Kitchen... 1 tab by  mouth once daily    Aspirin 81 Mg Tbec (Aspirin) .Marland Kitchen... Take one tablet by mouth daily  His updated medication list for this problem includes:    Glucotrol Xl 10 Mg Tb24 (Glipizide) .Marland Kitchen... Take  tablet by mouth once a day    Lantus 100 Unit/ml Soln (Insulin glargine) ..... Inject 25 unit subcutaneously every morning    Metformin Hcl 1000 Mg Tabs (Metformin hcl) .Marland Kitchen... Take 1 tablet by mouth twice a day    Altace 2.5 Mg Caps (Ramipril) .Marland Kitchen... 1 tab by mouth once daily    Aspirin 81 Mg Tbec (Aspirin) .Marland Kitchen... Take one tablet by mouth daily  Problem # 4:  CORONARY, ARTERIOSCLEROSIS (ICD-414.00)  Continue aspirin, ACE inhibitor and statin. The following medications were removed from the medication list:    Metoprolol Tartrate 50 Mg Tabs  (Metoprolol tartrate) .Marland Kitchen... 1 tab two times a day His updated medication list for this problem includes:    Nitroglycerin 0.4 Mg Subl (Nitroglycerin) .Marland Kitchen... Place 1 tablet under tongue    Altace 2.5 Mg Caps (Ramipril) .Marland Kitchen... 1 tab by mouth once daily    Aspirin 81 Mg Tbec (Aspirin) .Marland Kitchen... Take one tablet by mouth daily  The following medications were removed from the medication list:    Metoprolol Tartrate 50 Mg Tabs (Metoprolol tartrate) .Marland Kitchen... 1 tab two times a day His updated medication list for this problem includes:    Nitroglycerin 0.4 Mg Subl (Nitroglycerin) .Marland Kitchen... Place 1 tablet under tongue    Altace 2.5 Mg Caps (Ramipril) .Marland Kitchen... 1 tab by mouth once daily    Aspirin 81 Mg Tbec (Aspirin) .Marland Kitchen... Take one tablet by mouth daily  Problem # 5:  CEREBROVASCULAR DISEASE (ICD-437.9) Continue aspirin and statin.followup carotid Dopplers January 2013.  Problem # 6:  AORTIC VALVE REPLACEMENT, HX OF (ICD-V43.3) Continued SBE prophylaxis. Repeat echo when he returns in one year.  Patient Instructions: 1)  Your physician wants you to follow-up in: ONE YEAR You will receive a reminder letter in the mail two months in advance. If you don't receive a letter, please call our office to schedule the follow-up appointment.

## 2010-11-19 NOTE — Consult Note (Signed)
Summary: [facility]  [facility]   Imported By: Clydell Hakim 01/10/2010 12:09:46  _____________________________________________________________________  External Attachment:    Type:   Image     Comment:   External Document  Appended Document: [facility]    Past History:  Past Medical History: Current Problems:  CORONARY, ARTERIOSCLEROSIS (ICD-414.00) CEREBROVASCULAR DISEASE (ICD-437.9) NEUROPATHY, DIABETIC (ICD-250.60) HYPERTENSION, BENIGN SYSTEMIC (ICD-401.1) HYPERCHOLESTEROLEMIA (ICD-272.0) DIABETES MELLITUS II, UNCOMPLICATED (ICD-250.00) ANEMIA (ICD-285.9) GERD (ICD-530.81) REFLUX ESOPHAGITIS (ICD-530.11) BPH with urinary obstruction: [facility] 12/2009 Bilateral Hydrocele: [facility] 12/2009 History of aortic stenosis status post aortic valve replacement  Cervicalgia - bil. Foramenal stenosis C4-C5, C5-C6 Creatinine 1.2 (11/2005) quit tobacco 1996

## 2010-11-19 NOTE — Progress Notes (Signed)
Summary: triage   Phone Note Call from Patient Call back at Home Phone [phone]   Caller: Patient Summary of Call: having swelling in groin area Initial call taken by: De Nurse,  December 11, 2009 1:52 PM  Follow-up for Phone Call        has been present for months. it "goes down when I lay down" some pain when he walks recently. wants Dr. Mauricio Po who saw him last rather than the male pcp he is assigned to. scheduled at 11am with Dr. Mauricio Po Follow-up by: Golden Circle RN,  December 11, 2009 1:59 PM

## 2010-11-21 NOTE — Assessment & Plan Note (Signed)
Summary: ROA FOLLOW-UP/JRR   Vital Signs:  Patient profile:   77 year old male Weight:      206.25 pounds Temp:     97.5 degrees F oral Pulse rate:   74 / minute Pulse rhythm:   regular BP sitting:   126 / 78  (left arm) Cuff size:   large  Vitals Entered By: Selena Batten Dance CMA Duncan Dull) (October 31, 2010 3:59 PM) CC: Follow up   History of Present Illness: CC: f/u   1. cerumen impaction - didn't fill debrox.  uses qtip filled with H2O2 during shower in am.  doesn't note difference.  doesn't think has hearing issue.  2. DM - fasting 91 this am.  well controlled based on last A1c.  vision check 06/2010.  No HA, vision changes, CP/tightness, SOB, dizziness, syncope, leg swelling, palpitations  Allergies (verified): No Known Drug Allergies  Past History:  Past Medical History: Last updated: 09/24/2010 CORONARY, ARTERIOSCLEROSIS (ICD-414.00) CEREBROVASCULAR DISEASE (ICD-437.9)- pt denies HYPERTENSION, BENIGN SYSTEMIC (ICD-401.1) HYPERCHOLESTEROLEMIA (ICD-272.0) DIABETES MELLITUS II, UNCOMPLICATED (ICD-250.00)     NEUROPATHY, DIABETIC (ICD-250.60) GERD (ICD-530.81) BPH with urinary obstruction: [facility] 12/2009 Bilateral Hydrocele: [facility] 12/2009 History of aortic stenosis status post aortic valve replacement Cervicalgia - bil. Foramenal stenosis C4-C5, C5-C6  Social History: Last updated: 09/24/2010 Smoking - quit 1995.  occasional EtOH Caffeine: 2-3 cups coffee. Lives with son.   Occupation: retired, Naval architect. The patient is widowed and lives with 2 sons and one of his daughters here in [place].  He has been retired for many years.  He remains fairly active physically with his only limitation that being related to exertional shortness of breath.  He has a previous history of heavy tobacco abuse, although he quit smoking in 1995.  He denies any alcohol consumption.  Review of Systems       per HPI  Physical Exam  General:  generally well  appearing, no apparent distress Ears:  TMs covered by thick dark cerumen deeply bilaterally Mouth:   MMM, + dentures upper and lower.  no pharyngeal erythema Neck:  No deformities, masses, or tenderness noted.  no bruits Lungs:  Normal respiratory effort, chest expands symmetrically. Lungs are clear to auscultation, no crackles or wheezes. Heart:  regular S1 S2, no m/r/g Pulses:  2+ rad pulses Extremities:  no pedal edema   Impression & Recommendations:  Problem # 1:  CERUMEN IMPACTION (ICD-380.4) possibly affecting hearing.  rec fill debrox.  Problem # 2:  RENAL INSUFFICIENCY (ICD-588.9) chaned to tylenol only, no longer NSAIDs.  pt states legs doing ok, recheck Cr today.  Orders: TLB-BMP (Basic Metabolic Panel-BMET) (80048-METABOL) Venipuncture (16109)  Problem # 3:  HYPERTENSION, BENIGN SYSTEMIC (ICD-401.1)  His updated medication list for this problem includes:    Altace 2.5 Mg Caps (Ramipril) .Marland Kitchen... 1 tab by mouth once daily  BP today: 126/78 Prior BP: 124/64 (09/24/2010)  Labs Reviewed: K+: 5.1 (09/24/2010) Creat: : 1.8 (09/24/2010)   Chol: 135 (09/03/2010)   HDL: 34.90 (09/03/2010)   LDL: 59 (03/08/2009)   TG: 352.0 (09/03/2010)  Problem # 4:  DIABETES MELLITUS II, UNCOMPLICATED (ICD-250.00) great control as evidenced by A1c 6.2% 08/2010.  due next month return in 3-4 mo for f/u.  His updated medication list for this problem includes:    Aspirin 81 Mg Tbec (Aspirin) .Marland Kitchen... Take one tablet by mouth daily    Glucotrol Xl 10 Mg Tb24 (Glipizide) .Marland Kitchen... Take  tablet by mouth once a day    Lantus 100  Unit/ml Soln (Insulin glargine) ..... Inject 25 unit subcutaneously every evening    Metformin Hcl 1000 Mg Tabs (Metformin hcl) .Marland Kitchen... Take 1 tablet by mouth twice a day    Altace 2.5 Mg Caps (Ramipril) .Marland Kitchen... 1 tab by mouth once daily  Labs Reviewed: Creat: 1.8 (09/24/2010)   Microalbumin: 1+ (06/22/2008) Reviewed HgBA1c results: 6.2 (08/28/2010)  6.3 (12/12/2009)  Problem #  5:  CEREBROVASCULAR DISEASE (ICD-437.9) Continue aspirin and statin. followup carotid Dopplers January 2013. history of CVA in chart, pt denies.  Complete Medication List: 1)  Aspirin 81 Mg Tbec (Aspirin) .... Take one tablet by mouth daily 2)  Famotidine 20 Mg Tabs (Famotidine) .Marland Kitchen.. 1 tablet by mouth once a day 3)  Glucotrol Xl 10 Mg Tb24 (Glipizide) .... Take  tablet by mouth once a day 4)  Lantus 100 Unit/ml Soln (Insulin glargine) .... Inject 25 unit subcutaneously every evening 5)  Metformin Hcl 1000 Mg Tabs (Metformin hcl) .... Take 1 tablet by mouth twice a day 6)  Nitroglycerin 0.4 Mg Subl (Nitroglycerin) .... Place 1 tablet under tongue 7)  Altace 2.5 Mg Caps (Ramipril) .Marland Kitchen.. 1 tab by mouth once daily 8)  Simvastatin 40 Mg Tabs (Simvastatin) .... Take one tablet by mouth daily at bedtime 9)  Bd Insulin Syringe U-100 1 Ml Misc (Insulin syringes (disposable)) .... 90 day supply given 10)  Debrox 6.5 % Soln (Carbamide peroxide) .... Use as directed  Patient Instructions: 1)  Return in 3-4 mo for follow up diabetes. 2)  Consider getting debrox for ears. 3)  Good to see you tdoay, call clinic with quesitons.   Orders Added: 1)  TLB-BMP (Basic Metabolic Panel-BMET) [80048-METABOL] 2)  Venipuncture [91478] 3)  Est. Patient Level IV [29562]    Current Allergies (reviewed today): No known allergies

## 2010-11-27 NOTE — Progress Notes (Signed)
Summary: Blood sugar log  Phone Note Call from Patient   Caller: Patient Call For: Eric Boyden  MD Summary of Call: Patient called with his blood sugar readings: 11-11-10=98, 11-12-10=93, 11-13-10=88, 11-14-10=128, 11-15-10=90, 11-16-10=91, 11-17-10=120, 11-18-10=137 and 11-19-10=123.   I told him I would call him back if anything needed to be changed.   Initial call taken by: Janee Morn CMA Duncan Dull),  November 19, 2010 2:08 PM  Follow-up for Phone Call        thank him for log - looking good!  is he off metformin and has he increased lantus to 28 u daily?  if so, continue as is and keep appt with me. Follow-up by: Eric Boyden  MD,  November 19, 2010 2:53 PM  Additional Follow-up for Phone Call Additional follow up Details #1::        Patient notified and will keep appt.  Additional Follow-up by: Janee Morn CMA Duncan Dull),  November 19, 2010 4:46 PM    New/Updated Medications: LANTUS 100 UNIT/ML SOLN (INSULIN GLARGINE) Inject 28 units subcutaneously every evening

## 2010-11-28 ENCOUNTER — Ambulatory Visit: Payer: Self-pay | Admitting: Family Medicine

## 2010-12-05 ENCOUNTER — Other Ambulatory Visit: Payer: Self-pay | Admitting: Family Medicine

## 2010-12-05 ENCOUNTER — Encounter: Payer: Self-pay | Admitting: Family Medicine

## 2010-12-05 ENCOUNTER — Ambulatory Visit (INDEPENDENT_AMBULATORY_CARE_PROVIDER_SITE_OTHER): Payer: Medicare Other | Admitting: Family Medicine

## 2010-12-05 DIAGNOSIS — E119 Type 2 diabetes mellitus without complications: Secondary | ICD-10-CM

## 2010-12-05 DIAGNOSIS — I10 Essential (primary) hypertension: Secondary | ICD-10-CM

## 2010-12-05 DIAGNOSIS — N259 Disorder resulting from impaired renal tubular function, unspecified: Secondary | ICD-10-CM

## 2010-12-05 DIAGNOSIS — E7800 Pure hypercholesterolemia, unspecified: Secondary | ICD-10-CM

## 2010-12-06 LAB — MICROALBUMIN / CREATININE URINE RATIO
Creatinine,U: 219.7 mg/dL
Microalb Creat Ratio: 0.9 mg/g (ref 0.0–30.0)

## 2010-12-06 LAB — HEMOGLOBIN A1C: Hgb A1c MFr Bld: 6.9 % — ABNORMAL HIGH (ref 4.6–6.5)

## 2010-12-11 NOTE — Assessment & Plan Note (Signed)
Summary: f/u DM/KAD   Vital Signs:  Patient profile:   77 year old male Weight:      211 pounds Temp:     97.6 degrees F oral Pulse rate:   64 / minute Pulse rhythm:   regular BP sitting:   122 / 82  (left arm) Cuff size:   large  Vitals Entered By: Selena Batten Dance CMA Duncan Dull) (December 05, 2010 3:35 PM) CC: Follow up Comments **Patient brought blood sugar log   History of Present Illness: CC: f/u DM  1. DM - brongs log showing fasting sugars from 90-130.  off metformin (d/c'd 2/2 Cr consistently above 1.5).  On lantus 28 u daily and glipizide XL.  weight up to 211lbs.  hasn't been too active in last few weeks.  vision screen done november 2011.    2. HLD - trig 300s last check, endorses not fasting prior.  tolerating simvastatin 40mg  nightly.  last LDL 59 08/2010  3. renal insufficiency - Cr creeping upwards since 2010 to 1.7 latest check, staying at this dose so metformin stopped.    Current Medications (verified): 1)  Aspirin 81 Mg Tbec (Aspirin) .... Take One Tablet By Mouth Daily 2)  Famotidine 20 Mg Tabs (Famotidine) .Marland Kitchen.. 1 Tablet By Mouth Once A Day 3)  Glucotrol Xl 10 Mg Tb24 (Glipizide) .... Take  Tablet By Mouth Once A Day 4)  Lantus 100 Unit/ml Soln (Insulin Glargine) .... Inject 28 Units Subcutaneously Every Evening 5)  Nitroglycerin 0.4 Mg Subl (Nitroglycerin) .... Place 1 Tablet Under Tongue 6)  Altace 2.5 Mg Caps (Ramipril) .Marland Kitchen.. 1 Tab By Mouth Once Daily 7)  Simvastatin 40 Mg Tabs (Simvastatin) .... Take One Tablet By Mouth Daily At Bedtime 8)  Bd Insulin Syringe U-100 1 Ml  Misc (Insulin Syringes (Disposable)) .... 90 Day Supply Given 9)  Debrox 6.5 % Soln (Carbamide Peroxide) .... Use As Directed  Allergies (verified): No Known Drug Allergies  Past History:  Past Medical History: Last updated: 09/24/2010 CORONARY, ARTERIOSCLEROSIS (ICD-414.00) CEREBROVASCULAR DISEASE (ICD-437.9)- pt denies HYPERTENSION, BENIGN SYSTEMIC (ICD-401.1) HYPERCHOLESTEROLEMIA  (ICD-272.0) DIABETES MELLITUS II, UNCOMPLICATED (ICD-250.00)     NEUROPATHY, DIABETIC (ICD-250.60) GERD (ICD-530.81) BPH with urinary obstruction: [facility] 12/2009 Bilateral Hydrocele: [facility] 12/2009 History of aortic stenosis status post aortic valve replacement Cervicalgia - bil. Foramenal stenosis C4-C5, C5-C6  Social History: Smoking - quit 1995.  occasional EtOH Caffeine: 2-3 cups coffee. Lives with son.   Occupation: retired, Naval architect. Widowed, lives with 2 sons, 1 daughter in [place]  He remains fairly active physically with his only limitation that being related to exertional shortness of breath.   Review of Systems       per HPI  Physical Exam  General:  generally well appearing, no apparent distress Head:  Normocephalic and atraumatic without obvious abnormalities.  Eyes:  No corneal or conjunctival inflammation noted. EOMI. Perrla.  Mouth:   MMM, + dentures upper and lower.  no pharyngeal erythema Neck:  No deformities, masses, or tenderness noted.  no bruits Lungs:  Normal respiratory effort, chest expands symmetrically. Lungs are clear to auscultation, no crackles or wheezes. Heart:  regular S1 S2, no m/r/g Pulses:  2+ rad pulses 1+ L DP 2+ R DP Extremities:  no pedal edema  Diabetes Management Exam:    Foot Exam (with socks and/or shoes not present):       Sensory-Pinprick/Light touch:          Left medial foot (L-4): normal  Left dorsal foot (L-5): normal          Left lateral foot (S-1): diminished          Right medial foot (L-4): normal          Right dorsal foot (L-5): normal          Right lateral foot (S-1): normal       Sensory-Monofilament:          Left foot: normal          Right foot: normal       Inspection:          Left foot: abnormal             Comments: large bunion 1st MTP          Right foot: abnormal             Comments: large bunion 1st MTP       Nails:          Left foot: normal          Right foot:  normal   Impression & Recommendations:  Problem # 1:  HYPERPOTASSEMIA (ICD-276.7) resolved  Problem # 2:  RENAL INSUFFICIENCY (ICD-588.9) Cr stabilized at 1.7  Problem # 3:  HYPERTENSION, BENIGN SYSTEMIC (ICD-401.1) good control on low dose ACEI.  check microalb. His updated medication list for this problem includes:    Altace 2.5 Mg Caps (Ramipril) .Marland Kitchen... 1 tab by mouth once daily  BP today: 122/82 Prior BP: 126/78 (10/31/2010)  Labs Reviewed: K+: 4.8 (11/06/2010) Creat: : 1.7 (11/06/2010)   Chol: 135 (09/03/2010)   HDL: 34.90 (09/03/2010)   LDL: 59 (03/08/2009)   TG: 352.0 (09/03/2010)  Problem # 4:  DIABETES MELLITUS II, UNCOMPLICATED (ICD-250.00) check A1c, microalb.  foot exam today.  consider dropping glucotrol as on lantus.  good control as evidenced by cbg log.  His updated medication list for this problem includes:    Aspirin 81 Mg Tbec (Aspirin) .Marland Kitchen... Take one tablet by mouth daily    Glucotrol Xl 10 Mg Tb24 (Glipizide) .Marland Kitchen... Take  tablet by mouth once a day    Lantus 100 Unit/ml Soln (Insulin glargine) ..... Inject 28 units subcutaneously every evening    Altace 2.5 Mg Caps (Ramipril) .Marland Kitchen... 1 tab by mouth once daily  Orders: TLB-A1C / Hgb A1C (Glycohemoglobin) (83036-A1C) Venipuncture (16109) TLB-Microalbumin/Creat Ratio, Urine (82043-MALB)  Labs Reviewed: Creat: 1.7 (11/06/2010)   Microalbumin: 1+ (06/22/2008) Reviewed HgBA1c results: 6.2 (08/28/2010)  6.3 (12/12/2009)  Problem # 5:  HYPERCHOLESTEROLEMIA (ICD-272.0) trig elevated last check however not fasting when done.  LDL at goal. His updated medication list for this problem includes:    Simvastatin 40 Mg Tabs (Simvastatin) .Marland Kitchen... Take one tablet by mouth daily at bedtime  Labs Reviewed: SGOT: 23 (09/03/2010)   SGPT: 14 (09/03/2010)   HDL:34.90 (09/03/2010), 41.30 (03/08/2009)  LDL:59 (03/08/2009), 50 (05/18/2008)  Chol:135 (09/03/2010), 123 (03/08/2009)  Trig:352.0 (09/03/2010), 112.0  (03/08/2009)  Complete Medication List: 1)  Aspirin 81 Mg Tbec (Aspirin) .... Take one tablet by mouth daily 2)  Famotidine 20 Mg Tabs (Famotidine) .Marland Kitchen.. 1 tablet by mouth once a day 3)  Glucotrol Xl 10 Mg Tb24 (Glipizide) .... Take  tablet by mouth once a day 4)  Lantus 100 Unit/ml Soln (Insulin glargine) .... Inject 28 units subcutaneously every evening 5)  Nitroglycerin 0.4 Mg Subl (Nitroglycerin) .... Place 1 tablet under tongue 6)  Altace 2.5 Mg Caps (Ramipril) .Marland Kitchen.. 1 tab by mouth once  daily 7)  Simvastatin 40 Mg Tabs (Simvastatin) .... Take one tablet by mouth daily at bedtime 8)  Bd Insulin Syringe U-100 1 Ml Misc (Insulin syringes (disposable)) .... 90 day supply given 9)  Debrox 6.5 % Soln (Carbamide peroxide) .... Use as directed  Patient Instructions: 1)  return in 6 months for folllow up 2)  I'm glad things are looking good. 3)  blood work today (A1c to check sugars). 4)  Weight is a little up today (211lbs), watch diet and increase activity.   Orders Added: 1)  TLB-A1C / Hgb A1C (Glycohemoglobin) [83036-A1C] 2)  Venipuncture [04540] 3)  TLB-Microalbumin/Creat Ratio, Urine [82043-MALB] 4)  Est. Patient Level IV [98119]    Current Allergies (reviewed today): No known allergies

## 2010-12-14 ENCOUNTER — Other Ambulatory Visit: Payer: Self-pay | Admitting: Family Medicine

## 2011-01-08 ENCOUNTER — Ambulatory Visit: Payer: 59 | Admitting: Family Medicine

## 2011-01-18 ENCOUNTER — Other Ambulatory Visit: Payer: Self-pay | Admitting: Family Medicine

## 2011-01-18 NOTE — Telephone Encounter (Signed)
Refill request

## 2011-01-20 NOTE — Telephone Encounter (Signed)
Pt should be off metformin now 2/2 elevated creatinine.

## 2011-01-23 ENCOUNTER — Encounter: Payer: Self-pay | Admitting: Family Medicine

## 2011-01-30 LAB — BLOOD GAS, ARTERIAL
Bicarbonate: 22.4 mEq/L (ref 20.0–24.0)
Drawn by: 206361
O2 Saturation: 95.6 %
Patient temperature: 98.6
TCO2: 23.5 mmol/L (ref 0–100)
pH, Arterial: 7.409 (ref 7.350–7.450)

## 2011-01-30 LAB — PREPARE PLATELETS

## 2011-01-30 LAB — CBC
HCT: 25.7 % — ABNORMAL LOW (ref 39.0–52.0)
HCT: 26.1 % — ABNORMAL LOW (ref 39.0–52.0)
HCT: 29.2 % — ABNORMAL LOW (ref 39.0–52.0)
HCT: 29.7 % — ABNORMAL LOW (ref 39.0–52.0)
Hemoglobin: 10.1 g/dL — ABNORMAL LOW (ref 13.0–17.0)
Hemoglobin: 10.3 g/dL — ABNORMAL LOW (ref 13.0–17.0)
Hemoglobin: 13 g/dL (ref 13.0–17.0)
Hemoglobin: 9.5 g/dL — ABNORMAL LOW (ref 13.0–17.0)
MCHC: 34.1 g/dL (ref 30.0–36.0)
MCHC: 34.5 g/dL (ref 30.0–36.0)
MCHC: 34.5 g/dL (ref 30.0–36.0)
MCHC: 34.6 g/dL (ref 30.0–36.0)
MCHC: 34.7 g/dL (ref 30.0–36.0)
MCHC: 34.7 g/dL (ref 30.0–36.0)
MCHC: 34.7 g/dL (ref 30.0–36.0)
MCHC: 34.7 g/dL (ref 30.0–36.0)
MCV: 92.2 fL (ref 78.0–100.0)
MCV: 92.9 fL (ref 78.0–100.0)
MCV: 93.1 fL (ref 78.0–100.0)
MCV: 93.2 fL (ref 78.0–100.0)
MCV: 93.3 fL (ref 78.0–100.0)
MCV: 93.3 fL (ref 78.0–100.0)
Platelets: 109 10*3/uL — ABNORMAL LOW (ref 150–400)
Platelets: 111 10*3/uL — ABNORMAL LOW (ref 150–400)
Platelets: 116 10*3/uL — ABNORMAL LOW (ref 150–400)
Platelets: 116 10*3/uL — ABNORMAL LOW (ref 150–400)
Platelets: 132 10*3/uL — ABNORMAL LOW (ref 150–400)
Platelets: 132 10*3/uL — ABNORMAL LOW (ref 150–400)
Platelets: 91 10*3/uL — ABNORMAL LOW (ref 150–400)
Platelets: 97 10*3/uL — ABNORMAL LOW (ref 150–400)
RBC: 2.97 MIL/uL — ABNORMAL LOW (ref 4.22–5.81)
RBC: 3.13 MIL/uL — ABNORMAL LOW (ref 4.22–5.81)
RBC: 3.2 MIL/uL — ABNORMAL LOW (ref 4.22–5.81)
RBC: 3.21 MIL/uL — ABNORMAL LOW (ref 4.22–5.81)
RBC: 4.1 MIL/uL — ABNORMAL LOW (ref 4.22–5.81)
RDW: 12.7 % (ref 11.5–15.5)
RDW: 12.9 % (ref 11.5–15.5)
RDW: 13.2 % (ref 11.5–15.5)
RDW: 13.2 % (ref 11.5–15.5)
RDW: 13.3 % (ref 11.5–15.5)
RDW: 13.7 % (ref 11.5–15.5)
WBC: 10.9 10*3/uL — ABNORMAL HIGH (ref 4.0–10.5)
WBC: 11.2 10*3/uL — ABNORMAL HIGH (ref 4.0–10.5)
WBC: 6 10*3/uL (ref 4.0–10.5)

## 2011-01-30 LAB — CREATININE, SERUM
Creatinine, Ser: 1.28 mg/dL (ref 0.4–1.5)
GFR calc Af Amer: >60 mL/min (ref >60)
GFR calc Af Amer: >60 mL/min (ref >60)
GFR calc non Af Amer: 56 mL/min — ABNORMAL LOW (ref >60)

## 2011-01-30 LAB — GLUCOSE, CAPILLARY
Glucose-Capillary: 101 mg/dL — ABNORMAL HIGH (ref 70–99)
Glucose-Capillary: 113 mg/dL — ABNORMAL HIGH (ref 70–99)
Glucose-Capillary: 127 mg/dL — ABNORMAL HIGH (ref 70–99)
Glucose-Capillary: 136 mg/dL — ABNORMAL HIGH (ref 70–99)
Glucose-Capillary: 138 mg/dL — ABNORMAL HIGH (ref 70–99)
Glucose-Capillary: 140 mg/dL — ABNORMAL HIGH (ref 70–99)
Glucose-Capillary: 143 mg/dL — ABNORMAL HIGH (ref 70–99)
Glucose-Capillary: 149 mg/dL — ABNORMAL HIGH (ref 70–99)
Glucose-Capillary: 150 mg/dL — ABNORMAL HIGH (ref 70–99)
Glucose-Capillary: 162 mg/dL — ABNORMAL HIGH (ref 70–99)
Glucose-Capillary: 167 mg/dL — ABNORMAL HIGH (ref 70–99)
Glucose-Capillary: 177 mg/dL — ABNORMAL HIGH (ref 70–99)
Glucose-Capillary: 84 mg/dL (ref 70–99)
Glucose-Capillary: 84 mg/dL (ref 70–99)
Glucose-Capillary: 89 mg/dL (ref 70–99)

## 2011-01-30 LAB — PROTIME-INR
INR: 0.9 (ref 0.00–1.49)
INR: 1.4 (ref 0.00–1.49)
Prothrombin Time: 17.7 seconds — ABNORMAL HIGH (ref 11.6–15.2)
Prothrombin Time: 18.2 seconds — ABNORMAL HIGH (ref 11.6–15.2)

## 2011-01-30 LAB — POCT I-STAT 3, ART BLOOD GAS (G3+)
Acid-base deficit: 1 mmol/L (ref 0.0–2.0)
Acid-base deficit: 1 mmol/L (ref 0.0–2.0)
Bicarbonate: 24.6 mEq/L — ABNORMAL HIGH (ref 20.0–24.0)
O2 Saturation: 100 %
O2 Saturation: 95 %
Patient temperature: 35.3
Patient temperature: 36.7
TCO2: 25 mmol/L (ref 0–100)
TCO2: 26 mmol/L (ref 0–100)
TCO2: 27 mmol/L (ref 0–100)
pCO2 arterial: 37.9 mmHg (ref 35.0–45.0)
pCO2 arterial: 45.5 mmHg — ABNORMAL HIGH (ref 35.0–45.0)
pCO2 arterial: 47.2 mmHg — ABNORMAL HIGH (ref 35.0–45.0)
pH, Arterial: 7.326 — ABNORMAL LOW (ref 7.350–7.450)
pH, Arterial: 7.385 (ref 7.350–7.450)
pO2, Arterial: 260 mmHg — ABNORMAL HIGH (ref 80.0–100.0)
pO2, Arterial: 360 mmHg — ABNORMAL HIGH (ref 80.0–100.0)

## 2011-01-30 LAB — POCT I-STAT 4, (NA,K, GLUC, HGB,HCT)
Glucose, Bld: 113 mg/dL — ABNORMAL HIGH (ref 70–99)
Glucose, Bld: 122 mg/dL — ABNORMAL HIGH (ref 70–99)
Glucose, Bld: 124 mg/dL — ABNORMAL HIGH (ref 70–99)
Glucose, Bld: 126 mg/dL — ABNORMAL HIGH (ref 70–99)
HCT: 30 % — ABNORMAL LOW (ref 39.0–52.0)
HCT: 34 % — ABNORMAL LOW (ref 39.0–52.0)
Hemoglobin: 10.9 g/dL — ABNORMAL LOW (ref 13.0–17.0)
Hemoglobin: 11.6 g/dL — ABNORMAL LOW (ref 13.0–17.0)
Hemoglobin: 6.8 g/dL — CL (ref 13.0–17.0)
Potassium: 3.7 mEq/L (ref 3.5–5.1)
Potassium: 3.9 mEq/L (ref 3.5–5.1)
Potassium: 4.4 mEq/L (ref 3.5–5.1)
Potassium: 6.4 mEq/L (ref 3.5–5.1)
Sodium: 139 mEq/L (ref 135–145)
Sodium: 140 mEq/L (ref 135–145)
Sodium: 140 mEq/L (ref 135–145)

## 2011-01-30 LAB — BASIC METABOLIC PANEL
BUN: 11 mg/dL (ref 6–23)
BUN: 11 mg/dL (ref 6–23)
BUN: 16 mg/dL (ref 6–23)
BUN: 21 mg/dL (ref 6–23)
CO2: 26 mEq/L (ref 19–32)
CO2: 27 mEq/L (ref 19–32)
CO2: 29 mEq/L (ref 19–32)
CO2: 30 mEq/L (ref 19–32)
Calcium: 8.4 mg/dL (ref 8.4–10.5)
Calcium: 8.7 mg/dL (ref 8.4–10.5)
Calcium: 8.7 mg/dL (ref 8.4–10.5)
Chloride: 102 mEq/L (ref 96–112)
Chloride: 109 mEq/L (ref 96–112)
Chloride: 97 mEq/L (ref 96–112)
Chloride: 99 mEq/L (ref 96–112)
Creatinine, Ser: 1.21 mg/dL (ref 0.4–1.5)
Creatinine, Ser: 1.24 mg/dL (ref 0.4–1.5)
Creatinine, Ser: 1.29 mg/dL (ref 0.4–1.5)
Creatinine, Ser: 1.34 mg/dL (ref 0.4–1.5)
Creatinine, Ser: 1.34 mg/dL (ref 0.4–1.5)
GFR calc Af Amer: >60 mL/min (ref >60)
GFR calc Af Amer: >60 mL/min (ref >60)
GFR calc Af Amer: >60 mL/min (ref >60)
GFR calc non Af Amer: 51 mL/min — ABNORMAL LOW (ref >60)
GFR calc non Af Amer: 54 mL/min — ABNORMAL LOW (ref >60)
GFR calc non Af Amer: 56 mL/min — ABNORMAL LOW (ref >60)
Glucose, Bld: 104 mg/dL — ABNORMAL HIGH (ref 70–99)
Glucose, Bld: 130 mg/dL — ABNORMAL HIGH (ref 70–99)
Glucose, Bld: 134 mg/dL — ABNORMAL HIGH (ref 70–99)
Glucose, Bld: 141 mg/dL — ABNORMAL HIGH (ref 70–99)
Potassium: 3.3 mEq/L — ABNORMAL LOW (ref 3.5–5.1)
Potassium: 3.5 mEq/L (ref 3.5–5.1)
Sodium: 136 mEq/L (ref 135–145)
Sodium: 137 mEq/L (ref 135–145)
Sodium: 138 mEq/L (ref 135–145)

## 2011-01-30 LAB — PLATELET COUNT: Platelets: 97 10*3/uL — ABNORMAL LOW (ref 150–400)

## 2011-01-30 LAB — TYPE AND SCREEN
ABO/RH(D): A NEG
Antibody Screen: NEGATIVE

## 2011-01-30 LAB — MAGNESIUM
Magnesium: 2 mg/dL (ref 1.5–2.5)
Magnesium: 2.3 mg/dL (ref 1.5–2.5)

## 2011-01-30 LAB — POCT I-STAT 3, VENOUS BLOOD GAS (G3P V)
Acid-base deficit: 4 mmol/L — ABNORMAL HIGH (ref 0.0–2.0)
Bicarbonate: 22.5 mEq/L (ref 20.0–24.0)
O2 Saturation: 88 %
TCO2: 24 mmol/L (ref 0–100)
pO2, Ven: 62 mmHg — ABNORMAL HIGH (ref 30.0–45.0)

## 2011-01-30 LAB — PREPARE FRESH FROZEN PLASMA

## 2011-01-30 LAB — COMPREHENSIVE METABOLIC PANEL
ALT: 16 U/L (ref 0–53)
AST: 27 U/L (ref 0–37)
Alkaline Phosphatase: 53 U/L (ref 39–117)
CO2: 18 mEq/L — ABNORMAL LOW (ref 19–32)
Calcium: 9.6 mg/dL (ref 8.4–10.5)
GFR calc Af Amer: >60 mL/min (ref >60)
Potassium: 4.7 mEq/L (ref 3.5–5.1)
Sodium: 135 mEq/L (ref 135–145)
Total Protein: 6.8 g/dL (ref 6.0–8.3)

## 2011-01-30 LAB — URINALYSIS, ROUTINE W REFLEX MICROSCOPIC
Nitrite: NEGATIVE
Specific Gravity, Urine: 1.013 (ref 1.005–1.030)
Urobilinogen, UA: 0.2 mg/dL (ref 0.0–1.0)
pH: 5.5 (ref 5.0–8.0)

## 2011-01-30 LAB — HEMOGLOBIN AND HEMATOCRIT, BLOOD
HCT: 21.4 % — ABNORMAL LOW (ref 39.0–52.0)
Hemoglobin: 7.5 g/dL — CL (ref 13.0–17.0)

## 2011-01-30 LAB — POCT I-STAT, CHEM 8
Calcium, Ion: 1.19 mmol/L (ref 1.12–1.32)
Calcium, Ion: 1.29 mmol/L (ref 1.12–1.32)
Chloride: 106 mEq/L (ref 96–112)
Creatinine, Ser: 1.4 mg/dL (ref 0.4–1.5)
HCT: 24 % — ABNORMAL LOW (ref 39.0–52.0)
Hemoglobin: 9.9 g/dL — ABNORMAL LOW (ref 13.0–17.0)
Sodium: 139 mEq/L (ref 135–145)
TCO2: 25 mmol/L (ref 0–100)
TCO2: 25 mmol/L (ref 0–100)

## 2011-01-30 LAB — APTT: aPTT: 27 seconds (ref 24–37)

## 2011-02-04 LAB — POCT I-STAT 3, VENOUS BLOOD GAS (G3P V)
Acid-base deficit: 3 mmol/L — ABNORMAL HIGH (ref 0.0–2.0)
Bicarbonate: 22.8 mEq/L (ref 20.0–24.0)
O2 Saturation: 66 %
TCO2: 24 mmol/L (ref 0–100)
TCO2: 24 mmol/L (ref 0–100)
pCO2, Ven: 44.3 mmHg — ABNORMAL LOW (ref 45.0–50.0)
pH, Ven: 7.323 — ABNORMAL HIGH (ref 7.250–7.300)
pO2, Ven: 37 mmHg (ref 30.0–45.0)
pO2, Ven: 37 mmHg (ref 30.0–45.0)

## 2011-02-04 LAB — POCT I-STAT 3, ART BLOOD GAS (G3+)
Bicarbonate: 22.5 mEq/L (ref 20.0–24.0)
TCO2: 24 mmol/L (ref 0–100)
pH, Arterial: 7.385 (ref 7.350–7.450)

## 2011-03-04 NOTE — Cardiovascular Report (Signed)
NAMEJOSEDEJESUS, MARCUM NO.:  [id number]   MEDICAL RECORD NO.:  [id number]          PATIENT TYPE:  OIB   LOCATION:  1965                         FACILITY:  [facility]   PHYSICIAN:  Bevelyn Buckles. Bensimhon, MDDATE OF BIRTH:  Jun 07, 1927   DATE OF PROCEDURE:  DATE OF DISCHARGE:                            CARDIAC CATHETERIZATION   PATIENT IDENTIFICATION:  Eric [name] is a very pleasant 77 year old  male with history of coronary artery disease and aortic stenosis.  He  has recently been having progressive dyspnea.  Echocardiogram showed  severe aortic stenosis with a mean gradient of 48.  He is referred for  free surgical cardiac catheterization.   PROCEDURES PERFORMED:  1. Right heart cath.  2. Selective coronary angiography.  3. Left subclavian angiography.   DESCRIPTION OF PROCEDURE:  The risks and indications were explained.  Consent was signed and placed on the chart.  A 7-French sheath was  placed in the right femoral vein using a modified Seldinger technique.  Standard Swan-Ganz catheter was used for procedure.  A 4-French arterial  sheath was placed in the right femoral artery and standard catheters  including JL-4, 3-DRC were used.  All catheter changes made over wire.  Of note, we did not attempt to cross the aortic valve as we had  sufficient data by echocardiogram.  Once again, there are no apparent  complications.   Right atrial pressure mean of 12, RV pressure 39/8, PA pressure 36/16  with mean of 25.  Pulmonary capillary wedge pressure mean of 20.  Central aortic pressure 132/70 with a mean of 94.  Fick cardiac output  was 5.0 liters per minute.  Cardiac index 2.4 liters per minute per  meter squared.  Point of vascular resistance was 1.0 Wood units.   Left main had a distal calcified 50-60% stenosis which was contiguous  with the LAD, ramus and circumflex branches.   LAD was a large vessel coursing to the apex.  Had some moderate ostial  stenosis due to  the left main stenosis.  There is a 30% stenosis in the  midsection.   The ramus branch was again had some mild to moderate ostial stenosis.  Otherwise, free of flow-limiting disease.   Left circumflex had an 80% ostial lesion contiguous with the left main  lesion.  It gave off two marginal branches.  There was a 30% lesion in  the mid AV groove circumflex.  There were significant collaterals from  the distal left circumflex to the posterolateral and PDA coming off the  right coronary.   Right coronary artery had 80% ostial lesion, but had chronic total  occlusion in the midsection after the takeoff of the RV branch.  The  distal vessel filled from left-to-right collaterals.   Left subclavian angiography showed a 30% ostial lesion in the  subclavian.  The LIMA was widely patent to the chest wall.  There was an  80-90% ostial lesion in the left vertebral artery.   ASSESSMENT:  1. Mild pulmonary venous hypertension.  2. Three-vessel coronary artery disease.  3. Severe aortic stenosis by echocardiogram.  4. An 80-90% left  vertebral stenosis.   Plan will be referred to a cardiothoracic surgery for consultation  regarding possible aortic valve replacement and bypass surgery.      Bevelyn Buckles. Bensimhon, MD  Electronically Signed     DRB/MEDQ  D:  11/27/2008  T:  11/27/2008  Job:  [id number]

## 2011-03-04 NOTE — Assessment & Plan Note (Signed)
OFFICE VISIT   Eric [name], SCHAMP  DOB:  1927-03-19                                        December 18, 2008  CHART #:  [id number]   The patient returns to the office today for further discussion regarding  severe aortic stenosis and 3-vessel coronary artery disease.  He was  originally seen in consultation on December 04, 2008.  Since then, he  was seen in followup by Dr. Jens Som, and after further discussion, he  has agreed to proceed with elective surgery as soon as practical.  We  tentatively plan to proceed with surgery this Friday on December 22, 2008,  for aortic valve replacement and coronary artery bypass grafting.  The  patient returns to the office with his family today.  Over the last 2  weeks, he will reports no new significant problems or complaints.  He  states that he got nervous last night and had a bit of an upset stomach,  but he did not vomit and he has not had any other change in his bowel  habits.  He has not had any chest pain or shortness of breath.  He has  not had any tachypalpitations or dizzy spells.  He has developed a  painful right second toe that has been chronic and slightly worse  recently.  He had his nails trimmed too long ago and he states that  after that it seemed to get worse.  Otherwise, the remainder of his  review of systems is unchanged from previously.   PAST MEDICAL HISTORY:  Otherwise unchanged.   PHYSICAL EXAMINATION:  GENERAL:  Notable for well-appearing male.  VITAL SIGNS:  Blood pressure 134/72, pulse 72, and oxygen saturation 97%  on room air.  CHEST:  Auscultation of the chest reveals clear breath sounds.  CARDIOVASCULAR:  Notable for prominent murmur as noted previously.  ABDOMEN:  Soft, nontender.  EXTREMITIES:  Warm and well perfused.  There is no lower extremity  edema.  The right second toe is mildly erythematous.  The nail is  intact.  There is no obvious sign of any significant cellulitis.  He has  full  mobility.  There are no areas of skin breakdown.  There is no  purulent drainage.   IMPRESSION:  Severe aortic stenosis with 3-vessel coronary artery  disease.  I reviewed the indications, risks, and potential benefits of  surgery again with the patient and his family in the office today.  We  tentatively plan to proceed with surgery on Friday for elective aortic  valve replacement and coronary artery bypass grafting.  I have given him  a prescription for ciprofloxacin 500 mg p.o. twice daily to take  beginning immediately just in case he could have mild cellulitis  involving the right second toe.  However, his physical exam today is  quite benign and I do not think it looks bad enough to be concerned  about any sort of serious infection or reason to postpone surgery.  Ultimately, the patient should see a podiatrist or somebody specializes  in diabetic feet, and has given his severe valgus deformity and problems  with shoes, he will very susceptible for diabetic foot infections in the  future.  The patient will let us know if he has any further problems  between now and Friday, so that we can postpone surgery  as needed.  All  of their questions have been addressed.   Salvatore Decent. Cornelius Moras, M.D.  Electronically Signed   CHO/MEDQ  D:  12/18/2008  T:  12/18/2008  Job:  [id number]   cc:   Madolyn Frieze. Jens Som, MD, [facility]

## 2011-03-04 NOTE — Assessment & Plan Note (Signed)
[facility]                            CARDIOLOGY OFFICE NOTE   PHUONG, MOFFATT                     MRN:          [id number]  DATE:05/26/2008                            DOB:          06/06/1927    Eric [name] is a pleasant gentleman who has a history of coronary  artery disease by catheterization in 2004.  At that time, he was found  to have a 50% left main, 70% circumflex, and 70% right coronary artery  and then an occlusion after takeoff of the first acute marginal.  He has  been treated medically.  He also has a history of aortic stenosis and  cerebrovascular disease.  When I last saw him, we scheduled multiple  studies.  He had followup carotid Dopplers on May 18, 2008.  He had had  a 60-79% right internal carotid artery stenosis.  Followup was  recommended in 6 months.  He had a repeat echocardiogram on May 18, 2008.  His LV function was normal.  There was severe aortic stenosis  with a mean gradient of 48 mmHg and a peak velocity of 4.5 meters per  second.  He also had Myoview performed on May 18, 2008.  It showed a  prior basilar inferolateral infarct, but no ischemia.  Since I last saw  him, he has mild dyspnea with more extreme activities.  He denies any  orthopnea, PND, or pedal edema.  He has not had any exertional chest  pain and there has been no episodes of syncope.   HIS MEDICATIONS AT PRESENT INCLUDE:  1. Pepcid 20 mg p.o. daily.  2. Plavix 75 mg p.o. daily.  3. Aspirin 325 mg p.o. daily.  4. Glipizide ER 10 mg p.o. daily.  5. Altace 2.5 mg p.o. daily.  6. Toprol 25 mg p.o. daily.  7. Insulin.  8. Zocor 40 mg p.o. daily.  9. Metformin 1000 mg p.o. b.i.d.   PHYSICAL EXAMINATION:  VITAL SIGNS:  Today shows a blood pressure of  150/79.  His pulse is 61.  He weighs 208 pounds.  HEENT:  Normal.  NECK:  Supple.  CHEST:  Clear.  CARDIOVASCULAR:  Regular rate and rhythm.  There is a 3/6 systolic  murmur at the left sternal  border.  ABDOMEN:  No tenderness.  EXTREMITIES:  No edema.   DIAGNOSES:  1. Aortic stenosis - I had long discussions with Eric [name] today.      His aortic stenosis is progressing.  His previous mean gradient in      August 2008 was 36, and it is now 79.  However, he is not having      worsening symptoms.  He does have mild dyspnea on exertion, which      has been a chronic issue, but there is no chest pain or syncope.  I      feel that he is nearing requirement for aortic valve replacement.      He would like to delay this as long as possible and given that he      is having no symptoms, I think this  is reasonable.  We will plan to      repeat his echocardiogram in 6 months, when I see him back and he      may require aortic valve replacement at that time.  I have      instructed him to contact us with any change in his symptoms.  2. Coronary artery disease - He will continue on his aspirin as well      as his Plavix, statin, beta-blocker, and ACE inhibitor.  We will      need to discontinue his Plavix obviously prior to him needing      aortic valve replacement.  3. History of mild chest pain - His symptoms are atypical and his      Myoview shows no ischemia.  4. Diabetes mellitus.  5. Hypertension - His blood pressure is mildly elevated.  I will not      advance his medications, given his aortic stenosis.  6. Hyperlipidemia - He will continue on statin.  7. Gastroesophageal reflux disease.  8. Cerebrovascular disease - He will need followup carotid Dopplers in      6 months.  We will see him back in 6 months.      Madolyn Frieze Jens Som, MD, [facility]  Electronically Signed    BSC/MedQ  DD: 05/26/2008  DT: 05/26/2008  Job #: [id number]

## 2011-03-04 NOTE — Assessment & Plan Note (Signed)
[facility]                            CARDIOLOGY OFFICE NOTE   SACHIN, FERENCZ                     MRN:          [id number]  DATE:05/05/2008                            DOB:          06-Jul-1927    Mr. Wilsey is a very pleasant 77 year old gentleman who has a history  of coronary artery disease by catheterization in July 2004.  He also has  a history of aortic stenosis in the moderate range.  He also has  cerebrovascular disease.  Since I last saw him, he has lost his wife and  2 siblings.  He does have some dyspnea on exertion, but there is no  orthopnea, PND, or pedal edema.  He occasionally feels a pain in his  chest.  It only lasts for seconds.  It is not clearly exertional.  He  has not had any syncopal episodes.   MEDICATIONS:  1. Famotidine 20 mg p.o. daily.  2. Plavix 75 mg daily.  3. Aspirin 325 mg p.o. daily.  4. Glipizide ER 10 mg p.o. daily.  5. Altace 2.5 mg p.o. daily.  6. Toprol 25 mg p.o. daily.  7. Insulin.  8. Metformin.  9. Zocor 40 mg p.o. daily.   PHYSICAL EXAMINATION:  VITAL SIGNS:  Today, shows a blood pressure of  124/73 and the pulse is 75.  HEENT:  Normal.  NECK:  Supple.  CHEST:  Clear.  CARDIOVASCULAR:  Regular rate and rhythm.  There is a 2-3/6 systolic  murmur at the left sternal border.  ABDOMEN:  No tenderness.  EXTREMITIES:  No edema.   His electrocardiogram shows a sinus rhythm at a rate of 76.  There was a  prior inferior infarct, but there were no ST changes noted.   DIAGNOSES:  1. Coronary artery disease - The patient will continue on his aspirin,      but I would decrease this to 81 mg p.o. daily.  He will also      continue on Plavix, statin, beta-blocker, and ACE inhibitor.  2. Chest pain - The symptoms are somewhat atypical, but given his      history of coronary artery disease, we will plan to proceed with a      Myoview for risk stratification.  3. History of aortic stenosis - We will  plan to repeat his      echocardiogram.  4. Diabetes mellitus - Managed per his primary care physician.  5. Hypertension - His blood pressure is adequately controlled on his      present medications.  We will check a BMET to follow his potassium      and renal function.  6. Hyperlipidemia - He will continue on his statin, and we will check      lipids and liver and adjust as indicated.  7. History of gastroesophageal reflux disease.  8. Cerebrovascular disease - He needs followup carotid Dopplers.   I will see him back in 6 months.  He may require aortic valve  replacement in the future.     Madolyn Frieze Jens Som, MD, [facility]  Electronically Signed  BSC/MedQ  DD: 05/05/2008  DT: 05/06/2008  Job #: [id number]

## 2011-03-04 NOTE — Assessment & Plan Note (Signed)
[facility]                            CARDIOLOGY OFFICE NOTE   Eric [name]                     MRN:          [id number]  DATE:05/13/2007                            DOB:          10-01-1927    Eric [name] is a pleasant 77 -year-old gentleman who has a history of  coronary disease by catheterization in July of 2004. At that time, he  had a 50% left main, 30% mid LAD, 70% circumflex and a 70% ostial right  coronary artery followed by total occlusion. He has been treated  medically. His most recent Myoview was performed on July the 23rd, 2007.  At that time, he had a prior inferior infarct and there was very mild  peri-infarct ischemia. His ejection fraction was 50%. His most recent  echocardiogram was performed on May 07, 2006. His LV function was felt  to be normal at that time. He was found to have moderate aortic stenosis  with a mean gradient of 30 mmHg. There was mild aortic insufficiency.  Since I last saw him, he does have dyspnea on exertion, which has been a  chronic issue. There is no orthopnea, PND or pedal edema. He  occasionally feels a pain in his chest for 1-2 seconds, but does not  have exertional chest pain. There is no syncope.   MEDICATIONS:  1. Fadolmidine 20 mg p.o. daily.  2. Plavix 75 mg p.o. daily.  3. Zocor 40 mg p.o. daily.  4. Aspirin 325 mg p.o. daily.  5. Glipizide 10 mg p.o. daily.  6. Altace 2.5 mg p.o. daily.  7. Toprol 25 mg p.o. daily.  8. Insulin.  9. Metformin 1000 mg p.o. b.i.d.   PHYSICAL EXAMINATION:  Shows a blood pressure of 130/70, pulse is 71. He  weighs 211 pounds.  HEENT: Is normal.  NECK: Supple, with mildly diminished upstroke. There are bilateral  bruits.  CHEST: Clear.  CARDIOVASCULAR: Reveals a regular rate and rhythm. There is a 3/6  systolic murmur heard best at left sternal border. S2 is mildly  diminished.  ABDOMEN: Benign.  EXTREMITIES: Show no edema.   His  electrocardiogram shows a sinus rhythm at a rate of 71. There is a  prior inferior infarct. There were no ST changes noted.   DIAGNOSIS:  1. Coronary artery disease, we will continue with his aspirin, Plavix,      statin, beta-blocker and ACE inhibitor. Note, he has not had any      exertional chest pain.  2. History of moderate aortic stenosis, we will plan to repeat his      echocardiogram and he may require valve replacement in the future.  3. Diabetes mellitus. Per his primary care physician.  4. Hypertension. His blood pressure is well-controlled on his present      medications.  5. Hyperlipidemia. We will check lipids and liver and adjust his      medications as indicated. I will also check a BMET given his ACE      inhibitor use.  6. History of gastroesophageal reflux disease.  7. Bilateral carotid bruits. We will check  carotid Dopplers.   If his echocardiogram is unchanged then we will see him back in one year  and most likely repeat his Myoview at that time.     Madolyn Frieze Jens Som, MD, [facility]  Electronically Signed    BSC/MedQ  DD: 05/13/2007  DT: 05/13/2007  Job #: [id number]

## 2011-03-04 NOTE — Assessment & Plan Note (Signed)
OFFICE VISIT   Eric [name], Eric [name]  DOB:  Jan 03, 1927                                        January 22, 2009  CHART #:  [id number]   The patient returns to the office today for routine followup, status  post aortic valve replacement and coronary artery bypass grafting x3 on  December 22, 2008.  The patient has done remarkably well postoperatively.  During his early postoperative recovery, he did have some transient  postoperative atrial fibrillation and supraventricular tachycardia for  which he was treated with amiodarone.  He was discharged home in normal  sinus rhythm and has remained free of arrhythmia since then.  He has  been seen in followup by Dr. Jens Som at the [facility] office  and he plans to follow up with him again later this month.  He is  scheduled for routine followup echocardiogram.  The patient continues to  recover uneventfully.  He has very mild residual soreness in his chest  that does not bother him at all.  He is not taking any pain medicine.  He states that he feels much better than he did prior to surgery at this  point in time with considerable improvement in his shortness of breath  and exercise tolerance.  He has not had any fevers or chills.  He has  not had any productive cough.  The remainder of his review of systems is  entirely unremarkable.   PHYSICAL EXAMINATION:  GENERAL:  A well-appearing male.  VITAL SIGNS:  Blood pressure 114/66, pulse 69, and oxygen saturation 97%  on room air.  CHEST:  A median sternotomy incision that is healing nicely.  The  sternum is stable on palpation.  LUNGS:  Breath sounds are clear to auscultation and symmetrical  bilaterally.  No wheezes or rhonchi are demonstrated.  CARDIOVASCULAR:  Regular rate and rhythm.  No murmurs, rubs, or gallops  are appreciated.  ABDOMEN:  Soft and nontender.  EXTREMITIES:  Warm and well perfused.  The two small incisions just  above both knees from endoscopic  vein harvest have healed nicely.  There  is no lower extremity edema.  There is mild soft tissue firmness beneath  these two endoscopic vein incisions, but no surrounding cellulitis or  fluctuance to suggest a fluid collection.  The remainder of his physical  exam is unremarkable.   DIAGNOSTIC TEST:  Chest [facility]-ray obtained at the [facility]  today is reviewed.  This reveals clear lung fields bilaterally.  There  are no significant pleural effusions.  All the sternal wires appear  intact.   IMPRESSION:  The patient appears to be recovering quite nicely  approximately 1 month following aortic valve replacement and coronary  artery bypass grafting.  I think he is ready to get started in the  cardiac rehab program.   PLAN:  I have encouraged the patient to increase his physical activity  as tolerated with his only limitation at this point remaining that he  refrain from heavy lifting or strenuous use of his arms or shoulders for  probably another 2 months or so.  I think he can start driving an  automobile short distances during the daylight and progress from there.  I have encouraged him to get starting the cardiac rehab program.  He  could probably go ahead and stop taking  amiodarone at any point in time.  I certainly would not renew his prescription.  Otherwise, I have not  recommended any changes in medications.  We will plan to see the patient  back in 3 months' time just to make sure that he continues to recover  uneventfully.  All of his questions have been addressed.   Salvatore Decent. Cornelius Moras, M.D.  Electronically Signed   CHO/MEDQ  D:  01/22/2009  T:  01/23/2009  Job:  [id number]   cc:   Madolyn Frieze. Jens Som, MD, [facility]

## 2011-03-04 NOTE — Op Note (Signed)
NAMELONNEY, REVAK NO.:  [id number]   MEDICAL RECORD NO.:  [id number]          PATIENT TYPE:  INP   LOCATION:  2302                         FACILITY:  [facility]   PHYSICIAN:  Salvatore Decent. Cornelius Moras, M.D. DATE OF BIRTH:  Dec 30, 1926   DATE OF PROCEDURE:  12/22/2008  DATE OF DISCHARGE:                               OPERATIVE REPORT   PREOPERATIVE DIAGNOSES:  1. Severe aortic stenosis.  2. Three-vessel coronary artery disease.   POSTOPERATIVE DIAGNOSES:  1. Severe aortic stenosis.  2. Three-vessel coronary artery disease.   PROCEDURES:  Median sternotomy for aortic valve replacement (25-mm  [facility] Ease pericardial tissue valve) and coronary artery bypass  grafting x3 (left internal mammary artery to distal left anterior  descending coronary artery, saphenous vein graft to circumflex marginal  branch, saphenous vein graft to posterior descending coronary artery,  endoscopic saphenous vein harvest from bilateral thighs).   SURGEON:  Salvatore Decent. Cornelius Moras, MD   ASSISTANT:  Coral Ceo, PA   ANESTHESIA:  General.   BRIEF CLINICAL NOTE:  The patient is an 77 year old gentleman with known  history of aortic stenosis and coronary artery disease.  The patient has  been followed by Dr. Olga Millers.  Over the last 2 years, the patient  reports progressive symptoms of exertional shortness of breath and  occasional episodes of chest discomfort.  Followup echocardiogram  demonstrates severe aortic stenosis.  Left ventricular function is  preserved.  Cardiac catheterization confirms the presence of severe  aortic stenosis as well as severe 3-vessel coronary artery disease.  A  full consultation note has been dictated previously.  The patient and  his family have been counseled at length regarding the indications,  risks, and potential benefits of the surgery.  Alternative treatment  strategies have been discussed.  They understand and accept all  associated risks of  surgery and desired to proceed as described.   OPERATIVE FINDINGS:  1. Normal left ventricular systolic function with moderate left      ventricular hypertrophy and significant diastolic dysfunction.  2. Severe aortic stenosis.  3. Mild-to-moderate mitral regurgitation.  4. Good-quality left internal mammary artery and saphenous vein      conduit for grafting.  5. Good-quality target vessels for grafting.   OPERATIVE NOTE IN DETAIL:  The patient is brought to the operating room  on the above-mentioned date and central monitoring is established by the  anesthesia team under the care and direction of Dr. Laverle Hobby.  Specifically, a Swan-Ganz catheter is placed through the right internal  jugular approach.  A radial arterial line is placed.  Intravenous  antibiotics are administered.  Following the induction with general  endotracheal anesthesia, a Foley catheter is placed.  The patient's  chest, abdomen, both groins, and both lower extremities are prepared and  draped in sterile manner.  Baseline transesophageal echocardiogram is  performed by Dr. Katrinka Blazing.  This confirms the presence of severe aortic  stenosis.  There is normal left ventricular systolic function with  exception of some septal hypokinesis.  There is mild-to-moderate mitral  regurgitation.   Median sternotomy incision is performed  and left internal mammary artery  is dissected from the chest wall and prepared for bypass grafting.  The  left internal mammary artery is good-quality conduit.  Simultaneously,  saphenous vein is obtained from the patient's right thigh using  endoscopic vein harvest technique.  After the saphenous vein has been  removed, the segment from the right thigh is somewhat small-caliber and  felt to be poor quality conduit.  Therefore, an additional segment of  saphenous vein is removed from the patient's left thigh using endoscopic  vein harvest technique.  The saphenous vein removed from the left  thigh  is good-quality conduit.  After the saphenous vein has been removed, the  small incisions in both lower extremities are closed in multiple layers  with running absorbable suture.  The patient is heparinized systemically  and left internal mammary artery is transected distally.  It is noted to  have excellent flow.   The pericardium is opened.  The ascending aorta is normal in appearance.  The ascending aorta and the right atrium are cannulated for  cardiopulmonary bypass.  A retrograde coronary sinus catheter is placed  through the right atrium into the coronary sinus.  Cardiopulmonary  bypass is begun and the surface of the heart is inspected.  Distal  target vessels are selected for coronary bypass grafting.  A  cardioplegic cannula is placed in the ascending aorta and a temperature  probe is placed in the left ventricular septum.  A left ventricular vent  is placed through the right superior pulmonary vein.   The patient is allowed to cool passively to 30 degrees systemic  temperature.  The aortic crossclamp is applied and cold blood  cardioplegia is delivered initially in antegrade fashion through the  aortic root.  Iced-saline slush is applied for topical hypothermia and  supplemental cardioplegia is administered in retrograde through the  coronary sinus catheter.  The initial cardioplegic arrest and myocardial  cooling is excellent.  Repeat doses of cardioplegia are administered  intermittently throughout the crossclamp portion of the operation  through the aortic root, subsequently placed vein grafts in retrograde  through the coronary sinus catheter.   The following distal coronary anastomoses are performed:  1. The circumflex marginal branch is grafted with a saphenous vein      graft in end-to-side fashion.  This vessel measures 2.0 mm in      diameter and is a good-quality target vessel for grafting.  2. The posterior descending coronary artery, which arises as  early      takeoff from the right coronary artery and traverses across the      acute margin of the heart, is grafted with a saphenous vein graft      in end-to-side fashion.  This vessel measured 1.5 mm in diameter      and is a good-quality target vessel for grafting.  3. The distal left anterior descending coronary artery is grafted with      left internal mammary artery in end-to-side fashion.  This vessel      measures 1.8 mm in diameter and is a good-quality target vessel at      the site of distal grafting.   An oblique aortotomy incision is performed.  The aortic valve is  exposed.  The aortic valve is tricuspid, heavily calcified, and severely  stenotic.  The aortic valve is excised sharply.  The aortic annulus is  decalcified.  There is severe calcification in the annulus, but this is  technically straightforward  to manage.  After the annulus has been  decalcified, it is sized to accept a 25-mm stented bioprosthetic tissue  valve.  The aortic root is irrigated with copious saline solution.   Aortic valve replacement is performed using interrupted 2-0 Ethibond  horizontal mattress pledgeted sutures with pledgets in the subannular  position.  An [facility] Ease pericardial tissue valve, (size 25 mm,  model number [id number], serial number [id number]) is secured in place  uneventfully.  The valve seats normally without difficulty.  Rewarming  is begun.   The aortotomy is closed using a 2-layer closure of running 4-0 Prolene  suture.  Both proximal saphenous vein anastomoses are performed directly  to the ascending aorta prior to removal of the aortic crossclamp.  The  left ventricular septal temperature rises rapidly with reperfusion of  the left internal mammary artery.  One final dose of warm retrograde hot-  shot cardioplegia is administered.  The aortic crossclamp is removed  after total crossclamp time of 110 minutes.   The heart began to beat spontaneously without need for  cardioversion.  The retrograde cardioplegic cannula is removed.  All proximal and distal  coronary anastomoses are inspected for hemostasis and appropriate graft  orientation.  The aortotomy incision is carefully inspected for  hemostasis.  Epicardial pacing wire is fixed in the right ventricular  free wall into the right atrial appendage.  The patient is rewarmed to  37 degrees centigrade temperature.  The left ventricular vent is  removed.  The patient is weaned from cardiopulmonary bypass without  difficulty.  The patient's rhythm at separation from bypass is normal  sinus rhythm.  No inotropic support is required.  Total cardiopulmonary  bypass time of the operation is 134 minutes.  Followup transesophageal  echocardiogram performed by Dr. Katrinka Blazing after separation from bypass  demonstrates normal left ventricular systolic function.  There is a well-  seated bioprosthetic tissue valve in the aortic position.  There is no  aortic insufficiency.  The valve is functioning normally.  There is  moderate mitral regurgitation.   The venous and arterial cannulae are removed uneventfully.  Protamine is  administered to reverse the anticoagulation.  The mediastinum is  irrigated with saline solution.  Meticulous surgical hemostasis is  ascertained.  The mediastinum and the left pleural space are drained  using 3 chest tubes exited through separate stab incisions inferiorly.  The pericardium and soft tissues anterior to the aorta are  reapproximated loosely.  The sternum is closed with double-strength  sternal wire.  The soft tissues anterior to sternum are closed in  multiple layers and the skin is closed with a running subcuticular skin  closure.   The patient tolerated the procedure well and is transported to the  Surgical Intensive Care Unit in stable condition.  There are no  intraoperative complications.  All sponge, instrument, and needle counts  are verified correct at completion of the  operation.  The patient is  transfused 2 units packed red blood cells intraoperatively.  The patient  received 2 units of fresh frozen plasma and 2 packs of platelets for  thrombocytopenia and mild coagulopathy following separation from bypass.      Salvatore Decent. Cornelius Moras, M.D.  Electronically Signed     CHO/MEDQ  D:  12/22/2008  T:  12/23/2008  Job:  [id number]   cc:   Madolyn Frieze. Jens Som, MD, [facility]

## 2011-03-04 NOTE — Assessment & Plan Note (Signed)
[facility]                            CARDIOLOGY OFFICE NOTE   Eric [name], Eric [name]                     MRN:          [id number]  DATE:12/07/2008                            DOB:          [birth year]    Eric [name] is an 77 year old gentleman with history of coronary artery  disease and aortic stenosis.  He also has cerebrovascular disease.  When  I was last saw him on November 22, 2008, it was noted that his followup  echocardiogram had shown progression of his aortic stenosis that was now  severe.  He was also having progressive dyspnea on exertion.  We  scheduled him to have a cardiac catheterization, which was performed on  November 27, 2008.  He was found to have three-vessel coronary artery  disease.  His echocardiogram from November 16, 2008, had shown normal LV  function and severe aortic stenosis with a mean grade to 53 mmHg.  There  was mild mitral regurgitation.  He did see Dr. Cornelius Moras for consideration of  aortic valve replacement and coronary artery bypassing graft.  However,  he preferred to discuss this with me prior to proceeding.  He continues  to have dyspnea on exertion which has been progressive.  There is no  orthopnea, PND, or pedal edema.  He has not had chest pain.  He  occasionally feels mildly lightheaded with standing, but otherwise has  not had presyncope or syncope.   MEDICATIONS:  1. Altace 2.5 mg p.o. daily.  2. Pepcid 20 mg p.o. daily.  3. Glucotrol 10 mg tablets two p.o. daily.  4. Insulin.  5. Metformin 1000 mg p.o. b.i.d.  6. Plavix 75 mg p.o. daily.  7. Zocor 40 mg p.o. daily.  8. Toprol 50 mg p.o. daily.   PHYSICAL EXAMINATION:  VITAL SIGNS:  Today shows a blood pressure of  146/74 and his pulse is 67.  HEENT:  Normal.  NECK:  Supple.  CHEST:  Clear.  CARDIOVASCULAR:  Regular rhythm.  There is a 3/6 systolic murmur at the  left sternal border.  S2 is diminished.  ABDOMEN:  No tenderness.  EXTREMITIES:  No  edema.   DIAGNOSES:  1. Aortic stenosis - Eric [name] aortic stenosis is severe.  He      also has had progressive dyspnea on exertion.  I continue to feel      that he needs aortic valve replacement.  We have discussed that      today.  He is willing to proceed.  He will contact Dr. Orvan July      office and a date will be arranged.  He will stop his Plavix 10      days prior to the procedure.  2. Coronary artery disease - the patient will also undergo coronary      artery bypass graft at the time of his surgery.  He will continue      on his statin and beta-blocker as well as his ACE inhibitor.  He      will begin aspirin after his surgery.  3. Diabetes mellitus.  4. Hypertension - his  blood pressure is adequately controlled with his      present medications.  5. Hyperlipidemia - he will continue his statin.  6. Gastroesophageal reflux disease.  7. Cerebrovascular disease - the patient had a carotid Dopplers on      November 16, 2008.  There was a 60-79% right and 40-59% left      internal carotid artery stenosis.  He will need follow up Dopplers      in July.  We will see him back after surgery.     Madolyn Frieze Jens Som, MD, [facility]  Electronically Signed    BSC/MedQ  DD: 12/07/2008  DT: 12/08/2008  Job #: [id number]
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
## 2011-03-04 NOTE — Assessment & Plan Note (Signed)
[name]                            CARDIOLOGY OFFICE NOTE   Eric [name], Eric [name]                     MRN:          [id number]  DATE:01/08/2009                            DOB:          1927-02-28    Eric [name] is a pleasant gentleman who I have followed for aortic  stenosis and coronary artery disease.  He recently had increasing  symptoms.  His workup showed three-vessel coronary disease and severe  aortic stenosis.  The patient therefore on March 5, underwent aortic  valve replacement with #25-mm [name] Ease pericardial tissue  valve as well as coronary artery bypassing graft with a LIMA to the LAD,  saphenous vein graft to the circ marginal and the saphenous vein graft  to the PDA.  His postoperative course was complicated somewhat by mild  confusion.  He also had transient SVT and was treated with amiodarone.  Since discharge, he has felt somewhat weak, but is otherwise without  complaints.  There is no dyspnea, chest pain, palpitations, or syncope.  There is mild pedal edema.   MEDICATIONS:  1. Pepcid 20 mg p.o. daily.  2. Glucotrol XL 10 mg p.o. b.i.d.  3. Insulin.  4. Metformin 1000 mg p.o. b.i.d.  5. Nitroglycerin as needed.  6. Plavix 75 mg p.o. daily.  7. Aspirin 81 mg p.o. daily.  8. Zocor 40 mg p.o. daily.  9. Thiamine 100 mg at night.  10.Clonazepam 0.5 mg at night.  11.Lopressor 50 mg p.o. b.i.d.  12.Amiodarone 200 mg p.o. b.i.d.   PHYSICAL EXAMINATION:  VITAL SIGNS:  Today shows a blood pressure of  120/80 and his pulse is 68.  HEENT:  Normal.  NECK:  Supple.  CHEST:  Clear.  CARDIOVASCULAR:  Regular rate and rhythm.  There is no diastolic murmur  noted.  His sternotomy is without evidence of infection.  ABDOMEN:  No tenderness.  EXTREMITIES:  Trace to 1+ edema.   His electrocardiogram shows a sinus rhythm at a rate of 68.  There is  left anterior fascicular block.  There are nonspecific ST changes.  There is  a first-degree AV block.   DIAGNOSES:  1. Aortic stenosis status post aortic valve replacement - we will plan      to proceed with baseline echocardiogram status post aortic valve      replacement.  We also discussed the importance of subacute      bacterial endocarditis prophylaxis.  2. Coronary artery disease - he will continue on his aspirin and      Plavix as well as his statin and beta-blocker.  I will most likely      discontinue his Plavix in the near future.  3. Diabetes mellitus.  4. Hypertension - his blood pressure is controlled on his present      medication.  5. Postoperative atrial arrhythmias - I am decreasing his amiodarone      to 200 mg p.o. daily.  I will see him back in 8 weeks and if he      remains in sinus, we will discontinue his amiodarone altogether.  6. Hyperlipidemia -  he will continue his statin and when I see him      back in 8 weeks, we will check lipids and liver.  7. Gastroesophageal reflux disease.  8. Cerebrovascular disease - the patient will need followup carotid      Dopplers in 6 months.     Madolyn Frieze Jens Som, MD, [facility]  Electronically Signed    BSC/MedQ  DD: 01/08/2009  DT: 01/09/2009  Job #: [id number]
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
## 2011-03-04 NOTE — Assessment & Plan Note (Signed)
[facility]                            CARDIOLOGY OFFICE NOTE   Eric [name]                     MRN:          [id number]  DATE:11/22/2008                            DOB:          09/01/1927    [name] is a very pleasant 77 year old soon to be 77 year old  gentleman who has a history of coronary disease by catheterization in  2004.  Please refer to my previous notes for details.  He also has a  history of aortic stenosis and cerebrovascular disease.  I last saw him  on May 26, 2008.  At that time, he was noted that his aortic stenosis  had progressed by echocardiogram.  We discussed the fact that he was  nearing aortic valve replacement at that time.  He had follow up carotid  Dopplers performed on November 16, 2008, and this showed 60-79% right and  40-59% left internal carotid artery stenosis.  He also had a follow up  echocardiogram on November 16, 2008.  His LV function was normal.  Then,  there was a moderate LVH.  There was severe aortic stenosis with a peak  velocity of 4.7 m/sec and a mean gradient of 53 mmHg.  There was mild  aortic insufficiency.  There was mild MR.  Since that time, he does have  some dyspnea on exertion.  He thinks that it has worsened over the past  1 year.  There is no orthopnea, PND, or pedal edema.  He has not had  exertional chest pain or nausea.  He had syncope.   MEDICATIONS:  1. Altace 2.5 mg p.o. daily.  2. Insulin.  3. Pepcid 20 mg p.o. daily.  4. Glucotrol 10 mg tablets 2 p.o. daily.  5. Metformin 1000 mg p.o. b.i.d.  6. Plavix 75 mg p.o. daily.  7. Toprol 50 mg p.o. daily.  8. Zocor 40 mg p.o. daily.   PHYSICAL EXAMINATION:  VITAL SIGNS:  Blood pressure of 138/78 and his  pulse is 75.  He weighs 192 pounds.  HEENT:  Normal.  NECK:  Supple.  CHEST:  Clear.  CARDIOVASCULAR:  Regular rate and rhythm.  There is a 3/6 systolic  murmur at the left sternal border.  S2 is diminished.  ABDOMEN:   No tenderness.  EXTREMITIES:  No edema.   Electrocardiogram shows a sinus rhythm at a rate of 71.  There is a  prior inferior infarct.  There are no significant ST changes noted.   DIAGNOSES:  1. Aortic stenosis - [name] aortic stenosis is progressing on      his echocardiogram.  He has severe aortic stenosis now with a peak      velocity of 4.7 m/sec and a mean gradient of 53 mmHg.  I also think      that his symptoms are worsening as he has had increasing dyspnea on      exertion.  Note, he has not had chest pain or syncope.  I have had      a long discussions with him today in the office.  I think it is  time to proceed with aortic valve replacement.  He is somewhat      hesitant about this, but sounds as though he will almost like a      consent.  We will go ahead and schedule a right and left heart      catheterization.  He will then see me back in 2 weeks later and we      will reviewed his catheterization and schedule him to see a surgeon      at that time if he is willing.  2. Coronary artery disease - he will continue on his Plavix, statin,      and beta-blockers, well as angiotensin-converting enzyme inhibitor.  3. Diabetes mellitus.  4. Hypertension - his blood pressure is well controlled on his present      medications.  5. Hyperlipidemia - he will continue on his statin.  6. Gastroesophageal reflux disease.  7. Cerebrovascular disease - he will need follow up carotid Dopplers      in July.  Note, we will also check lipids and liver at the time of      his pre-catheterization laboratories.     Eric Frieze Eric Som, MD, [facility]  Electronically Signed    BSC/MedQ  DD: 11/22/2008  DT: 11/23/2008  Job #: [id number]

## 2011-03-07 NOTE — Cardiovascular Report (Signed)
[place]  Patient:    Eric [name], Eric [name]                     MRN: [id number] Proc. Date: 03/12/01 Adm. Date:  [id number] Attending:  Lenoria [name] CC:         Madolyn Frieze. Jens [name], M.D. [facility]  Family Practice Service   Cardiac Catheterization  INDICATIONS:  Eric [name] is 77 years old and is a primary patient of the Family Practice Service at [facility].  He has documented coronary disease with stenting of the intermediate branch of the circumflex artery with Eric [name] in 1999.  He had catheterization done 1-1/2 years ago because of an abnormal Cardiolite scan and was found to have a total occlusion of the right coronary artery which was old and 40% narrowing in the left main artery. He underwent IVUS as part of the REVERSAL trial.  He returns now for a follow-up IVUS as part of the REVERSAL trial.  He also has mild aortic stenosis with a gradient at catheterization in 1999 of 14.  We did not cross the valve at his last catheterization.  PROCEDURE:  The procedure was performed via the right femoral artery using arterial sheath and 6-French preformed coronary catheters.  A front wall arterial puncture was performed and Omnipaque contrast was used.  We crossed the aortic valve with a right catheter and a straight exchange wire and then exchanged for a pigtail catheter.  The patient was given weight-adjusted heparin to prolong the ACT to greater than 200 seconds.  We used a 7-French JL-4 guiding catheter and a short floppy wire for the IVUS run.  We passed the wire down the LAD without difficulty.  Using a 3.2 UltraCross, we did an automatic pullback.  The patient tolerated the procedure well and left the laboratory in satisfactory condition.  RESULTS: The left main coronary artery had a 40% distal stenosis.  The left anterior descending artery gave rise to four septal perforators and three diagonal branches.  There was 40%  narrowing in the mid LAD.  The circumflex artery gave rise to an intermediate branch, a marginal branch, an atrial branch, and a posterolateral branch.  There was less than 20% narrowing at the stent site in the intermediate branch.  There was 70% ostial stenosis in the circumflex artery.  The right coronary artery was completely occluded after a right ventricular branch.  There was also a 70% ostial stenosis.  The distal right coronary artery filled via collaterals from the left coronary artery and consisted of a marginal branch which supplied part of the inferior septum and a posterolateral branch.  A septal injection was performed to evaluate the patients left arm pain and there was no subclavian stenosis and patent vertebral and internal mammary arteries.  The left ventriculogram was performed in the RAO projection and showed good wall motion with no areas of hypokinesis.  The estimated ejection fraction was 70%.  The IVUS run showed mid to moderate plaque in the LAD.  There was heavy calcification in the distal left main with significant narrowing, but still a good lumen estimated at 3.0 x 2.5.  CONCLUSIONS: 1. Coronary artery disease with 40% narrowing of the left main, 40% narrowing    of the mid left anterior descending coronary artery, less than 20%    narrowing at the stent site in the intermediate branch of the circumflex    artery, 70% ostial narrowing of the circumflex  artery, and total occlusion    of the right coronary artery with normal left ventricular function. 2. Moderate aortic stenosis with a peak aortic valve gradient of 26 mmHg. 3. Intravascular ultrasound study as part of the REVERSAL trial.  RECOMMENDATIONS:  I think Eric [name] is doing well.  We will plan to treat him with Lipitor 40 mg in place of his study drug.  We will plan a P.A. follow-up to check his leg in one week and follow up with Eric [name] in one year. DD:  03/12/01 TD:  03/12/01 Job:  [id number]

## 2011-03-07 NOTE — Discharge Summary (Signed)
NAME:  Eric [name], Eric [name]                        ACCOUNT NO.:  [id number]   MEDICAL RECORD NO.:  [id number]                   PATIENT TYPE:  INP   LOCATION:  2012                                 FACILITY:  [facility]   PHYSICIAN:  Franklyn Lor, MD                      DATE OF BIRTH:  [birth year]   DATE OF ADMISSION:  10/04/2003  DATE OF DISCHARGE:  10/11/2003                                 DISCHARGE SUMMARY   CONSULTATION:  Barbette Hair. Arlyce Dice, M.D.   DISCHARGE DIAGNOSES:  1. Abdominal pain.  2. Diabetes mellitus type 2.  3. Hypertension.  4. History of coronary artery disease.   DISCHARGE MEDICATIONS:  1. Lantus insulin 20 units injected subcu q.h.s.  2. Tenormin 20 mg one p.o. daily.  3. Glucotrol 40 mg one p.o. daily with meal.  4. Glucophage 500 mg one p.o. b.i.d. with meal.  5. Zocor 80 mg one p.o. daily.  6. Plavix 75 mg one p.o. daily.  7. Protonix 40 mg one p.o. daily.  8. Tylenol 650 mg q.4-6h. p.r.n. for pain.   HISTORY OF PRESENT ILLNESS:  This is a 77 year old white male with history  of coronary artery disease who is status post PTCA in December of 1999 who  presented to the [facility] Emergency Room on October 04, 2003, complaining  of abdominal pain that was radiating to the chest.  The patient stated at  that time that he also had some shortness of breath and dyspnea.  He denied  nausea, vomiting, or diaphoresis.  He admitted at that time to having had  similar episodes in the past which required hospital admission, the last of  which was in July of 2004, during which he actually underwent cardiac  catheterization.  At the time of this admission, the patient did admit to  having some relief with use of two sublingual nitroglycerin tablets.  He  denied dysuria or frequency or any other weakness or paresthesias.  He  denied constipation.  He denied chest pain at the actual time of admission.   ADMISSION PHYSICAL EXAMINATION:  GENERAL APPEARANCE:  This was an elderly  appearing white male in no apparent distress.  He was resting in bed.  He  was alert and oriented x3.  VITAL SIGNS:  Temperature 97.6, blood pressure 166/75 which dropped to  113/63, pulse 100, respiratory rate 18, O2 saturation 97% on two liters by  nasal cannula.  LUNGS:  Diffuse rhonchi.  CARDIOVASCULAR:  Normal with the exception of a 2 to 3/6 soft ejection  murmur detected at all auscultation points.  ABDOMEN:  Minimally tender to palpation in the epigastric and right upper  quadrant area but he did not have rebound.  No hepatomegaly or splenomegaly  was detected.  The patient was moderately obese.  RECTAL:  Normal.  Guaiac negative.  EXTREMITIES:  The patient had no peripheral edema.  Brisk capillary  refill,  2+ pulses.   LABORATORY DATA:  White count 12.5 with ANC of 10.4, hemoglobin 14.4,  hematocrit 42.7, platelets 182.  Sodium 136, potassium 3.8, chloride 102,  bicarb 24, BUN 16, glucose 504.  Venous pH 7.255 and venous partial pressure  carbon dioxide 54.9.  Serum ketones negative.  Urine grew specific gravity  1.040, pH 5, glucose greater than 1000, hemoglobin 90, bilirubin 90, ketones  greater than 80, leukocyte esterase negative, nitrite negative.   EKG showed no apparent changes from previous EKGs.   Abdominal CT showed biliary dilatation confirmed the fact that the patient  was status post cholecystectomy, pancreatic duct not dilated, aortic  atherosclerosis without aneurysm.   HOSPITAL COURSE:  PROBLEM #1 -  ABDOMINAL PAIN:  The patient's pain remained  consistent throughout his stay prior to performance of an ERCP.  He  described it as gas radiating from the lower abdomen up into the substernal  area.  Liver enzymes showed total bilirubin 3.8, direct 3.0, indirect 1.8,  alkaline phosphatase 122, AST 237, ALT 145.  GGT elevated at 255.  Lipase  24, amylase 53.  The patient had chest x-ray which showed no acute disease.  Dr. Arlyce Dice of gastroenterology was consulted.   The patient had a right upper  quadrant ultrasound that showed mild common bile duct dilatation.  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  The patient had sphincterotomy.  Discharged home without  antibiotics.  Told to follow with GI on a p.r.n. basis.   PROBLEM #2 -  DIABETES MELLITUS:  The patient admitted upon admission that  he had not taken his p.o. diabetic medications for at least two weeks prior  to admission.  His blood sugar upon admission was greater than 500.  As  such, he was given insulin on sliding scale initially and then switched over  to Lantus 20 units subcuticular q.h.s.  He was also maintained on Glucotrol  20 mg and Glucophage 500 mg b.i.d.  His CBGs improved during his stay.  Hemoglobin A1C was 10.9.  The patient admitted that he was not very  compliant at home on his medications and we discussed the fact that this  would be an ongoing problem for him.  I advised him that he needed to follow  up with his primary care physician, Dr. Nicolette Bang, so that he may maintain  a tighter control on his diabetes.   PROBLEM #3 -  HYPERTENSION:  The patient's pressure elevated initially upon  admission, although during his stay he actually had decreased diastolic  pressures in the 40s, 50s, and 60s.  In the end, we were able to control  blood pressure with Tenormin 25 mg one p.o. daily and he was sent home on  this.   PROBLEM #4 -  HISTORY OF CORONARY ARTERY DISEASE:  The patient had cardiac  enzymes x3 that were negative.  EKGs failed to show ischemic changes.  The  patient denied chest pain during his stay here.  Discharged home on Plavix  and Zocor.   DISPOSITION:  The patient was advised to eat a low fat, low sugar diet.  Keep his ERCP site clean and dry and follow-up with Dr. Nicolette Bang on October 24, 2003, at 1:30 p.m. at [facility]  Franklyn Lor, MD    TD/MEDQ  D:  10/11/2003  T:  10/12/2003  Job:  [id number]   cc:   Barbette Hair. Arlyce Dice, M.D. [facility]

## 2011-03-07 NOTE — Assessment & Plan Note (Signed)
[facility]                              CARDIOLOGY OFFICE NOTE   JUANITO, GONYER                     MRN:          [id number]  DATE:05/19/2006                            DOB:          [birth year]    Mr. Haroon is a very pleasant gentleman that I have seen in the past for  coronary disease.  Previous catheterization in July 2004 revealed a 50% left  main, a 30% mid-LAD, a 70% circumflex and a 70% ostial right coronary artery  followed by total occlusion.  It was felt that medical therapy was  indicated.  He also has a history of aortic stenosis.  He was recently seen  for complaints of right upper extremity numbness.  An echocardiogram was  ordered, and his ejection fraction was noted to be normal.  There was  moderate aortic stenosis with a mean gradient of 29.8 mmHg and mild aortic  insufficiency.  He also had a nuclear study that showed an ejection fraction  of 50%.  There was scar with very mild peri-infarct ischemia in the  distribution of known RCA disease.  There was no other ischemia elsewhere.  Since that time, he has done well.  He occasionally feels a sharp chest pain  for seconds but no exertional chest pain.  He does have some dyspnea on  exertion which is a chronic issue and unchanged.  There is no syncope or  pedal edema.   MEDICATIONS:  1.  Famuldine 20 mg p.o. daily.  2.  Plavix 75 mg p.o. q. day.  3.  Zocor 20 mg p.o. q.h.s.  4.  Aspirin 325 mg p.o. daily.  5.  Metformin 500 mg p.o. daily.  6.  Glipizide ER 10 mg p.o. daily.  7.  Altace 2.5 mg p.o. daily.  8.  Toprol 25 mg p.o. q. day.  9.  Zyrtec.  10. Insulin.   PHYSICAL EXAMINATION:  VITAL SIGNS:  A blood pressure of 139/84, and his  pulse is 99.  He weighs 209 pounds.  NECK:  Supple.  CHEST:  Clear.  CARDIOVASCULAR:  Regular rate and rhythm.  There is a 2-3/6 systolic murmur  at the left sternal border.  S2 is diminished.  ABDOMEN:  Exam shows no pulsatile  masses and no bruits.  EXTREMITIES:  No edema.   DIAGNOSES:  1.  Coronary artery disease.  2.  Moderate aortic stenosis.  3.  Diabetes mellitus.  4.  Hypertension.  5.  Hyperlipidemia.  6.  History of gastroesophageal reflux disease.   PLAN:  Mr. Mcconaghy is doing well from a symptomatic standpoint.  We will  plan to continue his medical therapy.  We will check lipids and liver today  and adjust his Zocor with a goal LDL of less than 70 given his history of  coronary disease.  He will continue with risk factor and modification.  Note  he does not smoke for the past 10 years.  He is complaining of numbness in  his right upper extremity, and this sounds to be a potential cervical disk  issue.  I have asked  him to follow up with his primary care physician for  that issue.  I will see him back in one year, and we will most likely repeat  his electrocardiogram at that time.                              Madolyn Frieze Jens Som, MD, [facility]    BSC/MedQ  DD:  05/19/2006  DT:  05/19/2006  Job #:  [id number]

## 2011-03-07 NOTE — Discharge Summary (Signed)
NAME:  Eric [name], Eric [name]                        ACCOUNT NO.:  [id number]   MEDICAL RECORD NO.:  [id number]                   PATIENT TYPE:  INP   LOCATION:  2023                                 FACILITY:  [facility]   PHYSICIAN:  Pricilla Riffle, M.D.                 DATE OF BIRTH:  1927/09/21   DATE OF ADMISSION:  05/04/2003  DATE OF DISCHARGE:  05/05/2003                           DISCHARGE SUMMARY - REFERRING   PROCEDURE:  Coronary angiogram July 16.   REASON FOR ADMISSION:  Please refer to dictated admission note.   LABORATORY DATA:  Cardiac enzymes:  CPK-MB and troponin I markers normal  (x3), normal TSH, normal electrolytes and renal function.  Normal liver  enzymes.  Normal CBC.   Admission CXR:  No active disease.   HOSPITAL COURSE:  Following presentation to the emergency room, the symptoms  were worrisome for unstable angina pectoris.  The patient was placed on  regimen consisting of intravenous nitroglycerin, heparin, and Integrilin.  No acute changes were noted on initial electrocardiogram.  Serial cardiac  enzymes were all within normal limits.   Given the patient's worrisome symptomatology and known history of coronary  artery disease, recommendation was to proceed with diagnostic coronary  angiography.  This was performed on morning of scheduled discharge by Dr.  Samule Ohm (see report for full details), revealing moderate left main and  ostial circumflex disease with chronically occluded RCA with distal  collateralization.  Specifically, there was 50% left main with 30% mid-LAD,  70% ostial circumflex and 70% proximal and 100% mid-right coronary artery  disease.  LV gram showed hyperdynamic LVF (approximately 70%) with no  evidence of wall motion abnormality.   The patient presented with known history of aortic stenosis, previously  noted as moderate (mean gradient 26 mmHg) by previous catheterization in  2002.  At this time, his aortic stenosis was felt to be mild with a  16 mm  mean gradient by catheterization.  Of note, patient was in the process of  having a 2D echocardiogram as well at time of this dictation.   Dr. Samule Ohm recommended continued medical therapy for the coronary artery  disease and added Plavix to the previous home medication regimen.  No other  medication adjustments noted.   DISCHARGE MEDICATIONS:  1. Plavix 75 mg q.d.  2. Aspirin 325 mg q.d.  3. Zocor 20 mg q.h.s.  4. Atenolol 25 mg q.d.  5. Glucotrol XL 20 mg q.d.  6. Metformin 500 mg b.i.d.  7. Famotidine as previously directed.   DISCHARGE INSTRUCTIONS:  No heavy lifting/driving x2 days, low  fat/cholesterol diet, call the office if there is any swelling/bleeding in  the groin.   The patient is instructed to resume metformin 2 days following discharge.   The patient is scheduled to followup with Dr. Olga Millers on August 9 at  10:00 a.m.   DISCHARGE DIAGNOSES:  1. Chest pain/known coronary artery  disease.     a. Normal serial cardiac markers.     b. Moderate coronary artery disease with chronic 100% right coronary        artery - medical therapy recommended; coronary angiogram July 16.     c. Normal left ventricular function.     d. Enrolled in REVERSAL trial ________.  2. Aortic stenosis.     a. Mild (mean gradient 16 mmHg) by this angiogram.  3. Type 2 diabetes mellitus.  4. Hypertension.  5. Dyslipidemia.  6. History of tobacco.  7. History of gastroesophageal reflux disease.       Gene Serpe, P.A. [facility]                      Pricilla Riffle, M.D.    GS/MEDQ  D:  05/05/2003  T:  05/06/2003  Job:  [id number]   cc:   Viviann Spare A. Waynette Buttery, M.D.  Fam. Med - Resident - [place], [place] [address]  Fax: [phone]    cc:   Maylon Peppers. Waynette Buttery, M.D.  Fam. Med - Resident - [place], [place] [address]  Fax: [phone]

## 2011-03-07 NOTE — Discharge Summary (Signed)
. [facility]  Patient:    Eric [name], Eric [name]                     MRN: [id number] Adm. Date:  [id number] Disc. Date: [id number] Attending:  Tobin Chad                           Discharge Summary  ATTENDING PHYSICIAN:  Huey Bienenstock McDiarmid, M.D.  DISCHARGE DIAGNOSES: 1. Gastroenteritis with ileus. 2. Diabetes mellitus. 3. Hypertension.  DISCHARGE MEDICATIONS: 1. Atenolol 25 mg q.d. 2. Aspirin q.d. 3. Glipizide XL 10 mg q.d. 4. Pepcid 20 mg q.d. 5. Zocor per [facility].  HISTORY OF PRESENT ILLNESS:  The patient is a 77 year old male who awoke early on November 25, 2000 with abdominal pain and nausea.  The patient vomited x 2. That evening developed diarrhea which had persisted.  Unable to take fluids and felt weak.  No blood in stool.  No fever.  The patient also had intermediate chest pain at rest.  The patient has had similar pain for years. The patients wife was suffering from flulike symptoms at home.  PHYSICAL EXAMINATION:  VITAL SIGNS:  Examination during admission was significant for vitals with a temperature of 98.0, blood pressure of 141/87, heart rate is 73, respiratory rate of 20.  O2 saturation of 97%.  HEART:  A 2/6 systolic murmur.  Regular rate and rhythm.  LUNGS:  Clear to auscultation bilaterally.  ABDOMEN:  Increased bowel sounds, +2 generalized tenderness but soft right upper quadrant scar due to status post cholecystectomy and patient was heme negative.  LABORATORY DATA:  Cardiology enzymes negative.  EKG no acute changes.  Glucose of 180.  Amylase and lipase within normal limits.  LFTs within normal limits.  PROCEDURES:  Procedures during admission included KUB and later was followed by small-bowel follow-through.  HOSPITAL COURSE: #1 - GASTROENTERITIS WITH ILEUS:  The patient came in with watery diarrhea. No fever and no blood in stool.  The patient was made NPO.  IV fluids started. Clear liquid diet was  later added.  The patient had no significant improvement with this diet.  Repeated KUB. Continued concern for small-bowel obstruction.  Small-bowel follow-through was later done which showed it within normal limits.  Therefore, barium enema was not noted.  GI was consulted.  Agreed likely to gastroenteritis.  No further management needed.  As an outpatient may need a colonoscopy.  #2 - DIABETES MELLITUS TYPE 2:  The patients glipizide was held.  The patient was given sliding scale as needed of insulin.  Hemoglobin A1C was 7.7.   Upon discharge, glipizide was increased to 10 mg q.d. XL.  #3 - CORONARY ARTERY DISEASE:  Enzymes were negative.  EKG showed no acute changes.  No medications were changed during stay.  #4 - HISTORY OF CHEST PAIN:  Similar chest pain for four years.  Enzymes negative.  EKG showed no acute changes.  The patient is followed by [facility].  CONDITION ON DISCHARGE:  Stable.  FOLLOW-UP:  [facility] with Dr. Andrey Spearman on December 16, 2000 at 2:35 p.m. DD:  12/01/00 TD:  12/02/00 Job: [id number]

## 2011-03-07 NOTE — Discharge Summary (Signed)
[place]. [facility]  Patient:    Eric [name], Eric [name]                     MRN: [id number] Adm. Date:  [id number] Disc. Date: 03/13/01 Attending:  [name] Dictator:   Joellyn Rued, P.A.-C. CC:         Andrey Spearman, M.D.  Madolyn Frieze Jens Som, M.D. [facility]   Discharge Summary  DATE OF BIRTH:  05/25/1927  SUMMARY OF HISTORY:  [name] is a 77 year old white male with known coronary disease who was placed in a reversal study at his last catheterization in October of 2000 and he was scheduled for catheterization to follow up with this study.  At that time, he had a total right width collaterals, 40-50% left main, 50% distal LAD, 70% ostial circumflex, and a less than 30% lesion at the previously stented ramus with continued medical treatment.  He does have chronic stable angina and can walk a couple of miles in 30 minutes.  When he is climbing a hill, he will get a little tightness in his chest that is relieved with rest.  His history is notable for gallstones, kidney stones, fractured leg, laparoscopic cholecystectomy, remote tobacco use, hyperlipidemia, and non-insulin-dependent diabetes.  LABORATORY DATA:  The preadmission PTT was 21.  H&H 14.2 and 43.5, platelets 222, WBC 6.1.  Sodium 142, potassium 4.6, glucose 158, creatinine 1.2, BUN 12.  PT 11.1.  Post procedure CK total and MB were within normal limits.  The EKG showed sinus bradycardia, normal sinus rhythm, old inferior myocardial infarction, early R, and nonspecific ST-T wave changes.  HOSPITAL COURSE:  [name] was admitted for a cardiac catheterization. According to Dr. Everardo Beals. Brodies progress notes, he had a 40% distal left main, 70% proximal circumflex, less than 20% at the previous ramus stent, 40% mid LAD, 70% proximal RCA, and 100% mid RCA with left to right collaterals.  The EF was 70%.  It was felt that there was no major progression of the coronary artery disease.  He had  moderate AS with an aortic pressure of 161/84, mean 112, LV 187/30, and AV gradient 26 peak.  Post procedure, he complained of chest discomfort that was different from his usual angina.  EKGs did not show any changes.  He was also complaining of right groin discomfort and it was oozing.  Pressure was reheld.  Dopplers were checked and this did not show any pseudoaneurysm.  Bed rest was continued.  By the morning of Mar 13, 2001, catheterization was intact, he had no further problems, and he was ambulating without difficulty.  After reviewing the chart, Nathen May, M.D., F.A.C.C., felt that he could be discharged home.  DISCHARGE DIAGNOSES: 1. Chronic stable angina. 2. Admission for catheterization secondary to reversal study. 3. Catheterization complicated by right groin bleed.  DISPOSITION:  He is discharged home.  DISCHARGE MEDICATIONS: 1. He is given a new prescription for Protonix and asked to begin taking that    when his Pepcid ran out. 2. Atenolol 25 mg q.d. 3. Aspirin 81 mg q.d. 4. Glipizide 5 mg q.d. 5. Reversal study medication until this runs out. 6. Sublingual nitroglycerin as needed.  ACTIVITY:  He was asked to avoid strenuous activity or driving for two days.  WOUND CARE:  No soaking, tub baths, or lifting for one week.  He was given permission to showing and remove his dressing today.  If he has any problems with his  catheterization site, he was asked to call immediately.  DIET:  He was asked to maintain a low-salt, low-fat, and low-cholesterol diet.  FOLLOW-UP:  He will have a groin check on Mar 16, 2001, at 9:30 a.m. with the P.A. and will see Madolyn Frieze. Jens Som, M.D., in one year.  He will keep his appointment for the reversal study drug follow-up and at that point he will probably be resumed on his Zocor. DD:  03/13/01 TD:  03/13/01 Job: [id number]
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
## 2011-03-07 NOTE — H&P (Signed)
NAME:  Eric [name], Eric [name]                        ACCOUNT NO.:  [id number]   MEDICAL RECORD NO.:  [id number]                   PATIENT TYPE:  INP   LOCATION:  2023                                 FACILITY:  [facility]   PHYSICIAN:  Pricilla Riffle, M.D.                 DATE OF BIRTH:  1927/09/16   DATE OF ADMISSION:  05/04/2003  DATE OF DISCHARGE:                                HISTORY & PHYSICAL   IDENTIFYING INFORMATION:  The patient is a 77 year old patient of Dr. Lewayne Bunting.  He has a history of CAD and comes in today to the emergency  room for evaluation of chest pressure.   HISTORY OF PRESENT ILLNESS:  The patient has a coronary history that dates  back into the late 90's.  He underwent stent placement to the ramus  intermedius back in 1999.  Cardiac catheterization in 2000 as part of the  reversal study, showed a 100% RCA lesion and a  40% left main lesion.  Left  cardiac catheterization in 02/2001 showed a 40% left main lesion, 40% LAD  lesion, less than 20% in the ramus intermedius stent, 70% in the ostial  circumflex, 70% ostial circumflex, 70% ostial RCA, 100% RCA with distal  filling.  EF of 70% with no wall motion abnormalities.   The patient over the past few weeks is not having as much energy.  He says  he just does not rush to things.  Yesterday, he was in the supermarket  when he developed a dull, substernal pain, radiating throughout his whole  chest and chest pressure.  The pain became more intense, associated with  shortness of breath, nausea, some vomiting, and some diaphoresis.  He took a  nitroglycerin, drove home, then took a second nitroglycerin with  improvement.   The patient says he slept without any problems with chest pressure last  night or this morning.  He awoke with a recurrent pressure.  He planned to  mow the lawn but the chest pressure increased and he felt very weak.  He  called the office and was referred to the emergency room.  Now, he has  had  one sublingual nitroglycerin and some IV nitroglycerin, and says his pain is  down to approximately 1-2 out of 10.  No shortness of breath.   ALLERGIES:  None.   CURRENT MEDICATIONS:  1. Aspirin 325 mg daily.  2. Zocor 20 daily.  3. Atenolol 25 mg daily.  4. Cimetidine daily.   PAST MEDICAL HISTORY:  1. Coronary artery disease.  2. Dyslipidemia.  3. Moderate aortic stenosis by catheterization in 2002, mean gradient of 26.  4. Diabetes type II, diet-controlled.  5. History of tobacco use.  6. History of nephrolithiasis.  7. History of hypertension.   SOCIAL HISTORY:  The patient lives in [place].  He has a history of 50-  pack year smoking, not smoked in the past  8 years.  He does not drink.   FAMILY HISTORY:  Father died of an MI in his 17's.   REVIEW OF SYSTEMS:  Overall, have reviewed.  Some musculoskeletal complaints  of the left shoulder, otherwise, negative to the above problems, all systems  except as noted.   PHYSICAL EXAMINATION:  GENERAL:  The patient is in no acute distress.  VITAL SIGNS:  Temperature 98, pulse 67, respiratory rate 16, blood pressure  132/69 on arrival, now 95/66.  NECK:  Radiating murmur, question versus bruits.  JVP is normal.  CARDIAC:  Regular rate and rhythm, normal S1 and S2, no S3 or S4.  A grade  3/6 later-peaking systolic murmur at the apex radiating up through the  outflow tract.  No S3 or S4.  LUNGS:  Clear to auscultation.  ABDOMEN:  Mild diffuse tenderness (the patient says from retching  yesterday).  EXTREMITIES:  2+ pulses, no lower extremity edema.   DIAGNOSTIC DATA:  A 12-lead EKG showed a normal sinus rhythm, rate 64 beats  per minute.   LABORATORY DATA:  Per i-STAT:  hemoglobin 15, BUN and creatinine 11 and 1.1,  potassium 4.3.  Initial MB 1.3, troponin less than 0.05.   IMPRESSION:  The patient is a 77 year old gentleman with a history  concerning for unstable angina.  He currently has minimal pain, does not   tolerate nitroglycerin, given his marginal blood pressure.  Examination also  significant for some aortic stenosis, probable moderate.   Would heparinize, continue aspirin, add 2b 3a inhibitor, nitroglycerin only  as tolerated.  Will follow the patient with serial EKGs, troponins, will  need cardiac catheterization possibly tomorrow; if pain does not quiet down,  consider today.  Last meal at breakfast.  Check fasting lipids.           1.                                                  Pricilla Riffle, M.D.    PVR/MEDQ  D:  05/04/2003  T:  05/04/2003  Job:  [id number]

## 2011-03-07 NOTE — Cardiovascular Report (Signed)
NAME:  Eric [name], Eric [name]                        ACCOUNT NO.:  [id number]   MEDICAL RECORD NO.:  [id number]                   PATIENT TYPE:  INP   LOCATION:  2023                                 FACILITY:  [facility]   PHYSICIAN:  Salvadore Farber, M.D.             DATE OF BIRTH:  [birth year]   DATE OF PROCEDURE:  05/05/2003  DATE OF DISCHARGE:                              CARDIAC CATHETERIZATION   PROCEDURE:  Coronary angiography, left heart catheterization, left  ventriculography, pressure wire measurements of the ramus intermedius and  circumflex.   INDICATIONS:  Eric [name] is a 77 year old gentleman with coronary artery  disease.  Last catheterization was in October 2002.  At that  catheterization, 50% distal LAD and 70% ostial circumflex lesions had been  noted as well as a chronic occlusion of the RCA with collateralization from  the left.  He has done well with medical therapy since that time until  Wednesday when he had an episode of 30 minutes of chest pressure occurring  at rest.  He was admitted to the hospital and ruled out for myocardial  infarction.  Electrocardiogram was normal.  Based on his symptoms, he is  referred for diagnostic angiography.   PROCEDURAL TECHNIQUE:  Informed consent was obtained.  Under 1% lidocaine  local anesthesia, a 6-French sheath was placed in the right femoral artery  using the modified Seldinger technique.  Diagnostic angiography was  performed JL4 and JR4 catheters.  There was some difficulty passing a  pigtail into the LV.  Eventually a straight wire was passed into the LV.  The catheter was exchanged over a long wire for a 5-French pigtail catheter.  Simultaneous right femoral arterial sheath and LV measurements were  obtained.  After pullback, sheath and central aortic pressures were shown to  be identical.  Ventriculography was performed in the RAO projection by power  injection.   Because of his new symptoms and the moderate stenoses  of both the LAD and  circumflex, the decision was made for pressure wire of both the ramus  intermedius and circumflex to exclude a hemodynamically significant  stenosis.  Additional heparin was given to achieve an ACT of greater than  200 seconds.  The Integrilin, which the patient arrives in the laboratory  on, was continued.  Pressure wire was advanced into the proximal left main  and pressure normalized.  It was then advanced into the midportion of the  ramus intermedius.  Then 42 mcg of intracoronary adenosine was given.  FFR  was found to be 0.89.  The wire was then repositioned into the proximal  circumflex.  Here, again, 42 mcg of intracoronary adenosine was  administered.  FFR was measured to be 0.88.  Pressure wire was measured.  Final angiograms demonstrated no change in the stenosis.   COMPLICATIONS:  None.   FINDINGS:  1. LV:  145/11/15.  EF 70%, without regional wall motion abnormality.  2.  No mitral regurgitation.  3. Mild aortic stenosis with a mean gradient using simultaneous pressures of     16 mmHg.  4. Left main:  50% distal lesion.  5. LAD:  Large vessel giving rise to three very small diagonal branches.     There is a 30% stenosis of the mid vessel.  6. Ramus intermedius:  Large vessel without significant stenosis.  7. Circumflex:  Moderate-sized vessel giving rise to two obtuse marginals.     There is a 70% stenosis of the ostium.  8. RCA:  Moderate-sized, dominant vessel.  There is a 70% ostial stenosis     and then occlusion after the takeoff of the first acute marginal.  The     PLV well collateralized from the circumflex.  The PDA is less well     collateralized from the left.  9. FFR measurement demonstrated ramus intermedius 0.89, circumflex 0.88.   IMPRESSION AND RECOMMENDATIONS:  The patient has stable, moderate left main  and ostial circumflex disease in addition to his chronically occluded right  coronary artery.  The fractional flow reserve  measurements of the ramus  intermedius and circumflex are reassuring in regards to both the left main  and ostial circumflex stenoses.  Will plan on continued medical therapy with  the addition of Plavix to his regimen.                                               Salvadore Farber, M.D.    WED/MEDQ  D:  05/05/2003  T:  05/06/2003  Job:  [id number]  Maylon Peppers. Waynette Buttery, M.D.  [facility] - Resident - [place], [place] [address]  Fax: [phone]   cc:   Maylon Peppers. Waynette Buttery, M.D.  [facility] - Resident - [place], [place] [address]  Fax: [phone]

## 2011-03-12 ENCOUNTER — Other Ambulatory Visit: Payer: Self-pay | Admitting: Family Medicine

## 2011-03-19 ENCOUNTER — Other Ambulatory Visit: Payer: Self-pay | Admitting: *Deleted

## 2011-03-20 ENCOUNTER — Other Ambulatory Visit: Payer: Self-pay | Admitting: Family Medicine

## 2011-03-21 ENCOUNTER — Other Ambulatory Visit: Payer: Self-pay | Admitting: *Deleted

## 2011-03-21 MED ORDER — SIMVASTATIN 40 MG PO TABS: 40.0000 mg | ORAL_TABLET | Freq: Every day | ORAL | Status: DC

## 2011-03-21 MED ORDER — FAMOTIDINE 20 MG PO TABS: 20.0000 mg | ORAL_TABLET | Freq: Every day | ORAL | Status: DC

## 2011-03-25 ENCOUNTER — Other Ambulatory Visit: Payer: Self-pay | Admitting: *Deleted

## 2011-04-03 ENCOUNTER — Telehealth: Payer: Self-pay | Admitting: Cardiology

## 2011-04-03 NOTE — Telephone Encounter (Signed)
[facility]. Metoprolol 50 mg.

## 2011-04-03 NOTE — Telephone Encounter (Signed)
Per patient chart in [facility] and new chart in [facility], patient no longer taking Metoprolol. Removed from med list in 2010.  Placed call number on file, phone just rang and rang.  Will try again later.  Judithe Modest, CMA

## 2011-04-08 ENCOUNTER — Other Ambulatory Visit: Payer: Self-pay | Admitting: *Deleted

## 2011-04-10 ENCOUNTER — Telehealth: Payer: Self-pay | Admitting: *Deleted

## 2011-04-10 MED ORDER — RAMIPRIL 5 MG PO CAPS: 5.0000 mg | ORAL_CAPSULE | Freq: Every day | ORAL | Status: DC

## 2011-04-10 MED ORDER — METOPROLOL TARTRATE 50 MG PO TABS: 50.0000 mg | ORAL_TABLET | Freq: Two times a day (BID) | ORAL | Status: DC

## 2011-04-10 NOTE — Telephone Encounter (Signed)
Pt walked into the lobby requesting refills on his meds. Called and talked to pt because meds he requested are not on his med list. Pt states he is taking altace 5mg  once daily and metoprolol tart 50 mg twice daily. Pt was encouraged to make sure to bring his meds to his appt so we can make sure of what he is taking. Pt voiced understanding Eric [name]

## 2011-07-16 ENCOUNTER — Other Ambulatory Visit: Payer: Self-pay | Admitting: Family Medicine

## 2011-07-21 ENCOUNTER — Telehealth: Payer: Self-pay | Admitting: *Deleted

## 2011-07-21 NOTE — Telephone Encounter (Signed)
Changed to 28 u nightly.

## 2011-07-21 NOTE — Telephone Encounter (Signed)
Received fax from [facility] for Lantus.  Directions say 25 units SQ every morning.  Chart says 28 units.  Please advise.  Form in your IN box.

## 2011-08-14 ENCOUNTER — Other Ambulatory Visit: Payer: Self-pay | Admitting: Family Medicine

## 2011-08-14 MED ORDER — GLIPIZIDE ER 10 MG PO TB24: 10.0000 mg | ORAL_TABLET | Freq: Every day | ORAL | Status: DC

## 2011-08-15 NOTE — Telephone Encounter (Signed)
Refill request

## 2011-08-20 ENCOUNTER — Other Ambulatory Visit: Payer: Self-pay | Admitting: Family Medicine

## 2011-09-26 ENCOUNTER — Ambulatory Visit (INDEPENDENT_AMBULATORY_CARE_PROVIDER_SITE_OTHER): Payer: Medicare Other | Admitting: Cardiology

## 2011-09-26 ENCOUNTER — Encounter: Payer: Self-pay | Admitting: Cardiology

## 2011-09-26 DIAGNOSIS — Z79899 Other long term (current) drug therapy: Secondary | ICD-10-CM

## 2011-09-26 DIAGNOSIS — I2510 Atherosclerotic heart disease of native coronary artery without angina pectoris: Secondary | ICD-10-CM

## 2011-09-26 DIAGNOSIS — I10 Essential (primary) hypertension: Secondary | ICD-10-CM

## 2011-09-26 DIAGNOSIS — I679 Cerebrovascular disease, unspecified: Secondary | ICD-10-CM

## 2011-09-26 DIAGNOSIS — I359 Nonrheumatic aortic valve disorder, unspecified: Secondary | ICD-10-CM

## 2011-09-26 DIAGNOSIS — Z954 Presence of other heart-valve replacement: Secondary | ICD-10-CM

## 2011-09-26 DIAGNOSIS — I6529 Occlusion and stenosis of unspecified carotid artery: Secondary | ICD-10-CM

## 2011-09-26 DIAGNOSIS — E7800 Pure hypercholesterolemia, unspecified: Secondary | ICD-10-CM

## 2011-09-26 NOTE — Patient Instructions (Addendum)
Your physician wants you to follow-up in: YEAR WITH DR Shelda Pal will receive a reminder letter in the mail two months in advance. If you don't receive a letter, please call our office to schedule the follow-up appointment. Your physician recommends that you continue on your current medications as directed. Please refer to the Current Medication list given to you today. Your physician recommends that you return for lab work in: SAME DAY AS ECHO AND CAROTID BMET LIPID LIVER  DX V58.69  272.4 Your physician has requested that you have a carotid duplex. This test is an ultrasound of the carotid arteries in your neck. It looks at blood flow through these arteries that supply the brain with blood. Allow one hour for this exam. There are no restrictions or special instructions. DUE IN JAN 2013 DX STENOSIS Your physician has requested that you have an echocardiogram. Echocardiography is a painless test that uses sound waves to create images of your heart. It provides your doctor with information about the size and shape of your heart and how well your heart's chambers and valves are working. This procedure takes approximately one hour. There are no restrictions for this procedure. IN JAN 2013 DX AVR

## 2011-09-26 NOTE — Assessment & Plan Note (Signed)
Continued SBE prophylaxis. Repeat echocardiogram. 

## 2011-09-26 NOTE — Assessment & Plan Note (Signed)
Continue statin. Check lipids and liver. 

## 2011-09-26 NOTE — Assessment & Plan Note (Signed)
Continue aspirin and statin. 

## 2011-09-26 NOTE — Progress Notes (Signed)
HPI:Eric [name] is a pleasant gentleman who I have followed for aortic stenosis and coronary artery disease. His previous workup showed three-vessel coronary disease and severe aortic stenosis.  The patient therefore on December 22 2008, underwent aortic valve replacement with #25-mm [facility] Ease pericardial tissue valve as well as coronary artery bypassing graft with a LIMA to the LAD, saphenous vein graft to the circ marginal and the saphenous vein graft to the PDA.  His postoperative course was complicated somewhat by mild confusion.  He also had transient SVT and was treated with amiodarone. Post operative echocardiogram in April, 2010 showed normal LV function, a well-functioning aortic valve with a mean gradient of 6 mm mercury, and mild mitral regurgitation. He did have repeat carotid Dopplers performed in Jan 2011 and showed a  40-59% bilateral carotid stenosis. F/U recommended in 2 years. Since I last saw him in Nov 2011 he denies dyspnea, exertional chest pain or pedal edema. No syncope or presyncope. Some unsteadiness with ambulation.  Current Outpatient Prescriptions  Medication Sig Dispense Refill  . aspirin 81 MG tablet Take 81 mg by mouth daily.        . B-D INS SYR ULTRAFINE 1CC/30G 30G X 1/2" 1 ML MISC USE AS DIRECTED  100 each  11  . carbamide peroxide (DEBROX) 6.5 % otic solution 5 drops as directed.        . famotidine (PEPCID) 20 MG tablet Take 1 tablet (20 mg total) by mouth daily.  90 tablet  1  . glipiZIDE (GLIPIZIDE XL) 10 MG 24 hr tablet Take 1 tablet (10 mg total) by mouth daily.  90 tablet  0  . insulin glargine (LANTUS) 100 UNIT/ML injection Inject 28 Units into the skin at bedtime.        . INSULIN SYRINGE 1CC/29G (B-D INTEGRA INSULIN SYRINGE) 29G X 1/2" 1 ML MISC by Does not apply route as directed.        . metoprolol (LOPRESSOR) 50 MG tablet Take 1 tablet (50 mg total) by mouth 2 (two) times daily.  180 tablet  4  . nitroGLYCERIN (NITROSTAT) 0.4 MG SL tablet Place 0.4  mg under the tongue every 5 (five) minutes as needed.        . ramipril (ALTACE) 5 MG capsule Take 1 capsule (5 mg total) by mouth daily.  90 capsule  4  . simvastatin (ZOCOR) 40 MG tablet TAKE 1 TABLET AT BEDTIME  90 tablet  0     Past Medical History  Diagnosis Date  . Cerebrovascular disease, unspecified   . Coronary atherosclerosis of unspecified type of vessel, native or graft   . Esophageal reflux   . Pure hypercholesterolemia   . Essential hypertension, benign   . Type II or unspecified type diabetes mellitus with neurological manifestations, not stated as uncontrolled   . Bilateral hydrocele 3/11    [facility]  . History of aortic stenosis     s/p valve replacement  . Cervicalgia   . Foraminal stenosis of cervical region     bilateral-C4-C5, C5-C6  . Colon polyp     Past Surgical History  Procedure Date  . Cataract extraction 1990's    bilateral  . Cholecystectomy 1995  . Colonoscopy 1997    Mult. polyps- [facility]  . Sphincterectomy 09/20/03    for jaundice  . Ptca 12/99    with stent  . Coronary artery bypass graft 3/10    x3-using a left internal mammary artery to the left anterior descending  coronary artery, saphenous vein graft to circumflex marginal branch, spahenous vein graft  to posterior descendingcoronary artery. Endoscopic saphenous vein harvest from bilateral thighs was done.   . Aortic valve replacement 12/2008    with pericardial tissue valve  . Carotid US 10/2009    B ICA stenosis, stable disease, rec rpt 2 years  . Hydrocele excision     bilateral ([facility])    History   Social History  . Marital Status: Widowed    Spouse Name: N/A    Number of Children: 3  . Years of Education: N/A   Occupational History  . retired-truck driver    Social History Main Topics  . Smoking status: Former Smoker    Quit date: 10/20/1993  . Smokeless tobacco: Not on file  . Alcohol Use: Yes     occasional  . Drug Use: Not on file  . Sexually  Active: Not on file   Other Topics Concern  . Not on file   Social History Narrative   Caffeine: 2-3 cups coffeeLives with sonRetired truck driverWidowed, lives with 2 sons, 1 daughter in [place] remains fairly active physically with his only limitation that being related to exhertional shortness of breath    ROS: describes unsteadiness with gait but no fevers or chills, productive cough, hemoptysis, dysphasia, odynophagia, melena, hematochezia, dysuria, hematuria, rash, seizure activity, orthopnea, PND, pedal edema, claudication. Remaining systems are negative.  Physical Exam: Well-developed well-nourished in no acute distress.  Skin is warm and dry.  HEENT is normal.  Neck is supple. No thyromegaly.  Chest is clear to auscultation with normal expansion. Status post sternotomy Cardiovascular exam is regular rate and rhythm. 2/6 systolic murmur. No diastolic murmur. Abdominal exam nontender or distended. No masses palpated. Extremities show no edema. neuro grossly intact  ECG normal sinus rhythm, first degree AV block, left anterior fascicular block.

## 2011-09-26 NOTE — Assessment & Plan Note (Signed)
Continue aspirin and statin. Followup carotid Dopplers January 2013.

## 2011-09-26 NOTE — Assessment & Plan Note (Signed)
Blood pressure controlled. Continue present medications. Check potassium and renal function. 

## 2011-09-29 ENCOUNTER — Other Ambulatory Visit: Payer: Self-pay | Admitting: *Deleted

## 2011-09-29 MED ORDER — FAMOTIDINE 20 MG PO TABS: 20.0000 mg | ORAL_TABLET | Freq: Every day | ORAL | Status: DC

## 2011-09-29 NOTE — Telephone Encounter (Signed)
Done

## 2011-09-29 NOTE — Telephone Encounter (Signed)
Is it okay to refill medication? Patient is overdue for 6 months follow-up appointment.

## 2011-10-24 ENCOUNTER — Ambulatory Visit (HOSPITAL_COMMUNITY): Payer: Medicare Other | Attending: Cardiology | Admitting: Radiology

## 2011-10-24 DIAGNOSIS — Z954 Presence of other heart-valve replacement: Secondary | ICD-10-CM

## 2011-10-24 DIAGNOSIS — E7800 Pure hypercholesterolemia, unspecified: Secondary | ICD-10-CM | POA: Diagnosis not present

## 2011-10-24 DIAGNOSIS — I359 Nonrheumatic aortic valve disorder, unspecified: Secondary | ICD-10-CM

## 2011-10-24 DIAGNOSIS — I2510 Atherosclerotic heart disease of native coronary artery without angina pectoris: Secondary | ICD-10-CM | POA: Diagnosis not present

## 2011-10-24 DIAGNOSIS — E119 Type 2 diabetes mellitus without complications: Secondary | ICD-10-CM | POA: Insufficient documentation

## 2011-10-24 DIAGNOSIS — I10 Essential (primary) hypertension: Secondary | ICD-10-CM | POA: Diagnosis not present

## 2011-10-24 DIAGNOSIS — I6529 Occlusion and stenosis of unspecified carotid artery: Secondary | ICD-10-CM | POA: Insufficient documentation

## 2011-10-31 ENCOUNTER — Other Ambulatory Visit (INDEPENDENT_AMBULATORY_CARE_PROVIDER_SITE_OTHER): Payer: Medicare Other | Admitting: *Deleted

## 2011-10-31 ENCOUNTER — Other Ambulatory Visit: Payer: Self-pay | Admitting: Cardiology

## 2011-10-31 ENCOUNTER — Other Ambulatory Visit (HOSPITAL_COMMUNITY): Payer: Medicare Other | Admitting: Radiology

## 2011-10-31 ENCOUNTER — Other Ambulatory Visit: Payer: Self-pay | Admitting: *Deleted

## 2011-10-31 ENCOUNTER — Encounter (INDEPENDENT_AMBULATORY_CARE_PROVIDER_SITE_OTHER): Payer: Medicare Other | Admitting: *Deleted

## 2011-10-31 DIAGNOSIS — I10 Essential (primary) hypertension: Secondary | ICD-10-CM | POA: Diagnosis not present

## 2011-10-31 DIAGNOSIS — Z79899 Other long term (current) drug therapy: Secondary | ICD-10-CM

## 2011-10-31 DIAGNOSIS — I6529 Occlusion and stenosis of unspecified carotid artery: Secondary | ICD-10-CM

## 2011-10-31 DIAGNOSIS — E7800 Pure hypercholesterolemia, unspecified: Secondary | ICD-10-CM

## 2011-10-31 LAB — HEPATIC FUNCTION PANEL
ALT: 12 U/L (ref 0–53)
Albumin: 3.8 g/dL (ref 3.5–5.2)
Alkaline Phosphatase: 80 U/L (ref 39–117)
Bilirubin, Direct: 0 mg/dL (ref 0.0–0.3)
Total Protein: 7.4 g/dL (ref 6.0–8.3)

## 2011-10-31 LAB — BASIC METABOLIC PANEL
BUN: 23 mg/dL (ref 6–23)
CO2: 26 mEq/L (ref 19–32)
Glucose, Bld: 137 mg/dL — ABNORMAL HIGH (ref 70–99)
Potassium: 4.7 mEq/L (ref 3.5–5.1)
Sodium: 140 mEq/L (ref 135–145)

## 2011-11-09 ENCOUNTER — Other Ambulatory Visit: Payer: Self-pay | Admitting: Family Medicine

## 2011-11-18 ENCOUNTER — Other Ambulatory Visit: Payer: Self-pay | Admitting: Family Medicine

## 2011-11-26 ENCOUNTER — Telehealth: Payer: Self-pay | Admitting: Family Medicine

## 2011-11-26 NOTE — Telephone Encounter (Signed)
Requesting refills for 90 day supply and he only got 30 day supply.  Please call patient and refill for 90 day refill.  Thanks

## 2011-11-28 NOTE — Telephone Encounter (Signed)
Spoke with patient and advised follow was needed. Appt scheduled.

## 2011-12-01 ENCOUNTER — Ambulatory Visit (INDEPENDENT_AMBULATORY_CARE_PROVIDER_SITE_OTHER): Payer: Medicare Other | Admitting: Family Medicine

## 2011-12-01 ENCOUNTER — Encounter: Payer: Self-pay | Admitting: Family Medicine

## 2011-12-01 VITALS — BP 128/70 | HR 60 | Temp 97.8°F | Ht 69.0 in | Wt 214.5 lb

## 2011-12-01 DIAGNOSIS — E7800 Pure hypercholesterolemia, unspecified: Secondary | ICD-10-CM

## 2011-12-01 DIAGNOSIS — E119 Type 2 diabetes mellitus without complications: Secondary | ICD-10-CM | POA: Diagnosis not present

## 2011-12-01 DIAGNOSIS — N259 Disorder resulting from impaired renal tubular function, unspecified: Secondary | ICD-10-CM

## 2011-12-01 DIAGNOSIS — I10 Essential (primary) hypertension: Secondary | ICD-10-CM | POA: Diagnosis not present

## 2011-12-01 DIAGNOSIS — L989 Disorder of the skin and subcutaneous tissue, unspecified: Secondary | ICD-10-CM

## 2011-12-01 MED ORDER — CALCIUM CARBONATE ANTACID 500 MG PO CHEW: 1.0000 | CHEWABLE_TABLET | Freq: Every day | ORAL | Status: DC

## 2011-12-01 MED ORDER — SIMVASTATIN 40 MG PO TABS: 40.0000 mg | ORAL_TABLET | Freq: Every day | ORAL | Status: DC

## 2011-12-01 MED ORDER — CALCIUM CARBONATE ANTACID 500 MG PO CHEW: 1.0000 | CHEWABLE_TABLET | Freq: Every day | ORAL | Status: AC | PRN

## 2011-12-01 NOTE — Assessment & Plan Note (Signed)
Chronic. Good control, continue statin

## 2011-12-01 NOTE — Assessment & Plan Note (Signed)
Chronic. Good control.  The current medical regimen is effective;  continue present plan and medications.

## 2011-12-01 NOTE — Patient Instructions (Addendum)
Blood work today. Good to see you today.  Call us with questions. Call to schedule appointment with eye doctor. Return at your convenience for medicare wellness visit to go over preventative health care. Just use vaseline ointment on lesion right forehead.  Let me know if not improving.

## 2011-12-01 NOTE — Assessment & Plan Note (Signed)
rec stop peroxide and EtOH. Continue vaseline or triple abx ointment.

## 2011-12-01 NOTE — Progress Notes (Signed)
Subjective:    Patient ID: Eric [name], male    DOB: 1927/09/27, 78 y.o.   MRN: [id number]  HPI CC: med refill  78 yo with h/o T2DM with neuropathy, HLD, HTN, CAD s/p 3v CABG and AVR with pericardial tissue 2010, CVA, CRI, aortic valve replacement and BPH.  Presents today for med refill.  Not seen since 11/2010 by myself.  Saw cards 09/2011, blood work drawn there reviewed.  Wt Readings from Last 3 Encounters:  12/01/11 214 lb 8 oz (97.297 kg)  09/26/11 210 lb (95.255 kg)  12/05/10 211 lb (95.709 kg)   DM - compliant with meds.  Ran out of strips a few weeks ago.  Sugar was 90-100 fasting, last checked 2 wks ago.  Tends to check every morning.  Last vision exam >1 yr ago.   No recent foot exam.  No recent A1c.  HTN - compliant with meds.  Good control.  No HA, vision changes, CP/tightness, SOB, leg swelling.   CRI - stage 4.  Discussed with pt, reviewed Cr 1.9.  HLD - complaint with statin.   Lab Results  Component Value Date   CHOL 118 10/31/2011   HDL 35.30* 10/31/2011   LDLCALC 59 03/08/2009   LDLDIRECT 43.7 10/31/2011   TRIG 274.0* 10/31/2011   CHOLHDL 3 10/31/2011   skin lesion R lateral forehead - continues to ooze.  Present for several months.  Has not seen derm . Using alcohol and peroxide daily.  Last fall was 5 mo ago, hit left hip.  No other falls. Denies depression/anhedonia  Medications and allergies reviewed and updated in chart.  Past histories reviewed and updated if relevant as below. Patient Active Problem List  Diagnoses  . DIABETES MELLITUS II, UNCOMPLICATED  . NEUROPATHY, DIABETIC  . HYPERCHOLESTEROLEMIA  . CERUMEN IMPACTION  . HYPERTENSION, BENIGN SYSTEMIC  . CORONARY, ARTERIOSCLEROSIS  . CEREBROVASCULAR DISEASE  . GERD  . RENAL INSUFFICIENCY  . BENIGN PROSTATIC HYPERTROPHY, WITH URINARY OBSTRUCTION  . AORTIC VALVE REPLACEMENT, HX OF   Past Medical History  Diagnosis Date  . Cerebrovascular disease, unspecified   . Coronary atherosclerosis of  unspecified type of vessel, native or graft   . Esophageal reflux   . Pure hypercholesterolemia   . Essential hypertension, benign   . Type II or unspecified type diabetes mellitus with neurological manifestations, not stated as uncontrolled   . Bilateral hydrocele 3/11    [facility]  . History of aortic stenosis     s/p valve replacement  . Cervicalgia   . Foraminal stenosis of cervical region     bilateral-C4-C5, C5-C6  . Colon polyp    Past Surgical History  Procedure Date  . Cataract extraction 1990's    bilateral  . Cholecystectomy 1995  . Colonoscopy 1997    Mult. polyps- [facility]  . Sphincterectomy 09/20/03    for jaundice  . Ptca 12/99    with stent  . Coronary artery bypass graft 3/10    x3-using a left internal mammary artery to the left anterior descending coronary artery, saphenous vein graft to circumflex marginal branch, spahenous vein graft  to posterior descendingcoronary artery. Endoscopic saphenous vein harvest from bilateral thighs was done.   . Aortic valve replacement 12/2008    with pericardial tissue valve  . Carotid US 10/2009    B ICA stenosis, stable disease, rec rpt 2 years  . Hydrocele excision     bilateral ([facility])   History  Substance Use Topics  . Smoking  status: Former Smoker    Quit date: 10/20/1993  . Smokeless tobacco: Not on file  . Alcohol Use: Yes     occasional   Family History  Problem Relation Age of Onset  . Heart attack Father 57  . Breast cancer Mother 75  . Brain cancer Sister    No Known Allergies Current Outpatient Prescriptions on File Prior to Visit  Medication Sig Dispense Refill  . aspirin 81 MG tablet Take 81 mg by mouth daily.        . B-D INS SYR ULTRAFINE 1CC/30G 30G X 1/2" 1 ML MISC USE AS DIRECTED  100 each  11  . GLIPIZIDE XL 10 MG 24 hr tablet TAKE 1 TABLET DAILY  90 tablet  0  . insulin glargine (LANTUS) 100 UNIT/ML injection Inject 28 Units into the skin at bedtime.        . INSULIN SYRINGE  1CC/29G (B-D INTEGRA INSULIN SYRINGE) 29G X 1/2" 1 ML MISC by Does not apply route as directed.        . metoprolol (LOPRESSOR) 50 MG tablet Take 1 tablet (50 mg total) by mouth 2 (two) times daily.  180 tablet  4  . ramipril (ALTACE) 5 MG capsule Take 1 capsule (5 mg total) by mouth daily.  90 capsule  4  . carbamide peroxide (DEBROX) 6.5 % otic solution 5 drops as directed.        . nitroGLYCERIN (NITROSTAT) 0.4 MG SL tablet Place 0.4 mg under the tongue every 5 (five) minutes as needed.          Review of Systems Per HPI    Objective:   Physical Exam  Nursing note and vitals reviewed. Constitutional: He appears well-developed and well-nourished. No distress.  HENT:  Head: Normocephalic and atraumatic.  Right Ear: External ear normal.  Left Ear: External ear normal.  Nose: Nose normal.  Mouth/Throat: Oropharynx is clear and moist. No oropharyngeal exudate.  Eyes: Conjunctivae and EOM are normal. Pupils are equal, round, and reactive to light. No scleral icterus.  Neck: Normal range of motion. Neck supple. Carotid bruit is not present. No thyromegaly present.  Cardiovascular: Normal rate, regular rhythm, normal heart sounds and intact distal pulses.   No murmur heard. Pulmonary/Chest: Effort normal and breath sounds normal. No respiratory distress. He has no wheezes. He has no rales.  Musculoskeletal: He exhibits no edema.       Diabetic foot exam: Bilateral hallux valgus deformity present No skin breakdown No calluses  Normal DP/PT pulses Normal sensation to light touch and slightly diminished to monofilament on left Nails normal  Lymphadenopathy:    He has no cervical adenopathy.  Skin: Skin is warm and dry. No rash noted.       Unhealing shallow ulcer right forehead  Psychiatric: He has a normal mood and affect.       Assessment & Plan:

## 2011-12-01 NOTE — Assessment & Plan Note (Signed)
Reviewed blood work and last creatinine, discussed CKD. Discussed improtance of hydration and avoidance of NSAIDs. Lab Results  Component Value Date   CREATININE 1.9* 10/31/2011  Estimated Creatinine Clearance: 33.9 ml/min (by C-G formula based on Cr of 1.9).

## 2011-12-01 NOTE — Assessment & Plan Note (Signed)
Check blood work today. Anticipate good control based on sugars noted today.

## 2011-12-02 LAB — BASIC METABOLIC PANEL
GFR: 37.53 mL/min — ABNORMAL LOW (ref >60.00)
Potassium: 4.9 mEq/L (ref 3.5–5.1)
Sodium: 136 mEq/L (ref 135–145)

## 2011-12-02 LAB — MICROALBUMIN / CREATININE URINE RATIO
Creatinine,U: 193.6 mg/dL
Microalb Creat Ratio: 0.8 mg/g (ref 0.0–30.0)
Microalb, Ur: 1.6 mg/dL (ref 0.0–1.9)

## 2011-12-16 DIAGNOSIS — M25549 Pain in joints of unspecified hand: Secondary | ICD-10-CM | POA: Diagnosis not present

## 2011-12-16 DIAGNOSIS — G5600 Carpal tunnel syndrome, unspecified upper limb: Secondary | ICD-10-CM | POA: Diagnosis not present

## 2012-01-15 ENCOUNTER — Other Ambulatory Visit: Payer: Self-pay | Admitting: *Deleted

## 2012-01-15 NOTE — Telephone Encounter (Signed)
Pt is asking for a years worth of refills on famotidine 20 mg's to be sent to [facility].  This is no longer on med list.

## 2012-01-16 MED ORDER — FAMOTIDINE 20 MG PO TABS: 20.0000 mg | ORAL_TABLET | Freq: Every day | ORAL | Status: DC

## 2012-01-16 NOTE — Telephone Encounter (Signed)
Sent in.plz notify pt.  

## 2012-01-19 NOTE — Telephone Encounter (Signed)
Patient notified as instructed by telephone. 

## 2012-01-27 ENCOUNTER — Other Ambulatory Visit: Payer: Self-pay

## 2012-01-27 NOTE — Telephone Encounter (Signed)
Pt said [facility] told him it was too early to refill Famotidine and someone from Dr Sharen Hones office had called him days ago and said refill was sent in. I called [facility] and spoke with Cala Bradford and she said med would be sent 01/28/12 and pt should receive by Fri 01/30/12. Pt was appreciative.

## 2012-02-01 ENCOUNTER — Other Ambulatory Visit: Payer: Self-pay | Admitting: Family Medicine

## 2012-02-17 ENCOUNTER — Other Ambulatory Visit: Payer: Self-pay | Admitting: Family Medicine

## 2012-03-08 ENCOUNTER — Ambulatory Visit (INDEPENDENT_AMBULATORY_CARE_PROVIDER_SITE_OTHER): Payer: Medicare Other | Admitting: Family Medicine

## 2012-03-08 ENCOUNTER — Encounter: Payer: Self-pay | Admitting: Family Medicine

## 2012-03-08 VITALS — BP 124/82 | HR 64 | Temp 98.2°F | Ht 69.0 in | Wt 213.5 lb

## 2012-03-08 DIAGNOSIS — H6120 Impacted cerumen, unspecified ear: Secondary | ICD-10-CM | POA: Diagnosis not present

## 2012-03-08 DIAGNOSIS — Z0000 Encounter for general adult medical examination without abnormal findings: Secondary | ICD-10-CM | POA: Diagnosis not present

## 2012-03-08 NOTE — Patient Instructions (Signed)
Return in 6 -9 months for follow up. Good to see you today, call us with questions. For kidney function - ensure staying well hydrated and avoid anti inflammatories like ibuprofen or aleve or advil or motrin.

## 2012-03-08 NOTE — Assessment & Plan Note (Signed)
-   Declines treatment today

## 2012-03-08 NOTE — Assessment & Plan Note (Signed)
I have personally reviewed the Medicare Annual Wellness questionnaire and have noted 1. The patient's medical and social history 2. Their use of alcohol, tobacco or illicit drugs 3. Their current medications and supplements 4. The patient's functional ability including ADL's, fall risks, home safety risks and hearing or visual impairment. 5. Diet and physical activity 6. Evidence for depression or mood disorders The patients weight, height, BMI have been recorded in the chart.  Hearing and vision has been addressed. I have made referrals, counseling and provided education to the patient based review of the above and I have provided the pt with a written personalized care plan for preventive services. See scanned questionairre. Advanced directives discussed:  Has living will at home in safe.  States would want daughter to be HCPOA.  Reviewed preventative protocols and updated unless pt declined. Declines immunizations, declines prostate and colon screening. Trouble with arithmetic and with spelling.  States does not read much since retired from work.

## 2012-03-08 NOTE — Progress Notes (Signed)
Subjective:    Patient ID: Eric [name], male    DOB: [birth year], 78 y.o.   MRN: [id number]  HPI CC: medicare wellness  78 yo with h/o T2DM with neuropathy, HLD, HTN, CAD s/p 3v CABG and AVR with pericardial tissue 2010, CVA, CRI, aortic valve replacement and BPH.  Some CTS on right side, uses wrist brace.  Some hearing loss on screening - denies problems.  L ear better than right ear. Vision checked and stable.   Preventative: Denies recent falls. Denies mood issues - depression, anhedonia. Tetanus - declines Shingles - doesn't want. Colon screening - unsure when last had colonoscopy, h/o polyps.  Declines further interventions.  When gets constipated takes MoM and resolves.  No blood in stool that he's noted. Prostate screening - doesn't want.  Unsure if has had in past. Advanced directives: HCPOA - would want daughter to be this.  Has living will at home in safe.  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   Wt Readings from Last 3 Encounters:  03/08/12 213 lb 8 oz (96.843 kg)  12/01/11 214 lb 8 oz (97.297 kg)  09/26/11 210 lb (95.255 kg)     Medications and allergies reviewed and updated in chart.  Past histories reviewed and updated if relevant as below. Patient Active Problem List  Diagnoses  . Type 2 diabetes, controlled, with neuropathy  . NEUROPATHY, DIABETIC  . HYPERCHOLESTEROLEMIA  . HYPERTENSION, BENIGN SYSTEMIC  . CORONARY, ARTERIOSCLEROSIS  . CEREBROVASCULAR DISEASE  . GERD  . CKD (chronic kidney disease) stage 3, GFR 30-59 ml/min  . BENIGN PROSTATIC HYPERTROPHY, WITH URINARY OBSTRUCTION  . AORTIC VALVE REPLACEMENT, HX OF  . Skin lesion   Past Medical History  Diagnosis Date  . Cerebrovascular disease, unspecified   . Coronary atherosclerosis of unspecified type of vessel, native or graft     s/p CABG 2010  . Esophageal reflux     . Pure hypercholesterolemia   . Essential hypertension, benign   . Type II or unspecified type diabetes mellitus with neurological manifestations, not stated as uncontrolled   . Bilateral hydrocele 3/11    [facility]  . History of aortic stenosis     s/p valve replacment 2010  . Cervicalgia   . Foraminal stenosis of cervical region     bilateral-C4-C5, C5-C6  . Colon polyp    Past Surgical History  Procedure Date  . Cataract extraction 1990's    bilateral  . Cholecystectomy 1995  . Colonoscopy 1997    Mult. polyps- [facility]  . Sphincterectomy 09/20/03    for jaundice  . Ptca 12/99    with stent  . Coronary artery bypass graft 3/10    x3-using a left internal mammary artery to the left anterior descending coronary artery, saphenous vein graft to circumflex marginal branch, spahenous vein graft  to posterior descendingcoronary artery. Endoscopic saphenous vein harvest from bilateral thighs was done.   . Aortic valve replacement 12/2008    with pericardial tissue valve  . Carotid US 10/2009    B ICA stenosis, stable disease, rec rpt 2 years  . Hydrocele excision     bilateral ([facility])  . L leg trauma 1957    truck over left leg   History  Substance Use Topics  . Smoking status: Former Smoker    Quit date: 10/20/1993  . Smokeless tobacco: Not on file  .  Alcohol Use: Yes     occasional   Family History  Problem Relation Age of Onset  . Heart attack Father 79  . Breast cancer Mother 16  . Brain cancer Sister    No Known Allergies Current Outpatient Prescriptions on File Prior to Visit  Medication Sig Dispense Refill  . aspirin 81 MG tablet Take 81 mg by mouth daily.        . B-D INS SYR ULTRAFINE 1CC/30G 30G X 1/2" 1 ML MISC USE AS DIRECTED  100 each  11  . calcium carbonate (TUMS) 500 MG chewable tablet Chew 1 tablet (200 mg of elemental calcium total) by mouth daily as needed for heartburn.      . INSULIN SYRINGE 1CC/29G (B-D INTEGRA INSULIN SYRINGE) 29G  X 1/2" 1 ML MISC by Does not apply route as directed.        . metoprolol (LOPRESSOR) 50 MG tablet Take 1 tablet (50 mg total) by mouth 2 (two) times daily.  180 tablet  4  . nitroGLYCERIN (NITROSTAT) 0.4 MG SL tablet Place 0.4 mg under the tongue every 5 (five) minutes as needed.        . ramipril (ALTACE) 5 MG capsule Take 1 capsule (5 mg total) by mouth daily.  90 capsule  4  . simvastatin (ZOCOR) 40 MG tablet Take 1 tablet (40 mg total) by mouth at bedtime.  90 tablet  3  . DISCONTD: GLIPIZIDE XL 10 MG 24 hr tablet TAKE 1 TABLET DAILY (NEED APPOINTMENT)  90 tablet  3  . DISCONTD: LANTUS 100 UNIT/ML injection INJECT 28 UNITS UNDER THE SKIN EVERY MORNING  3 vial  0    Review of Systems  Constitutional: Negative for fever, chills, activity change, appetite change, fatigue and unexpected weight change.  HENT: Negative for hearing loss and neck pain.   Eyes: Negative for visual disturbance.  Respiratory: Negative for cough, chest tightness, shortness of breath and wheezing.   Cardiovascular: Negative for chest pain, palpitations and leg swelling.  Gastrointestinal: Negative for nausea, vomiting, abdominal pain, diarrhea, constipation, blood in stool and abdominal distention.  Genitourinary: Negative for hematuria and difficulty urinating.  Musculoskeletal: Negative for myalgias and arthralgias.  Skin: Negative for rash.  Neurological: Negative for dizziness, seizures, syncope and headaches.  Hematological: Does not bruise/bleed easily.  Psychiatric/Behavioral: Negative for dysphoric mood. The patient is not nervous/anxious.        Objective:   Physical Exam  Nursing note and vitals reviewed. Constitutional: He is oriented to person, place, and time. He appears well-developed and well-nourished. No distress.  HENT:  Head: Normocephalic and atraumatic.  Right Ear: External ear and ear canal normal.  Left Ear: External ear and ear canal normal.  Nose: Nose normal.  Mouth/Throat:  Oropharynx is clear and moist. No oropharyngeal exudate.       Bilateral deep cerumen impactions - declines irrigation  Eyes: Conjunctivae and EOM are normal. Pupils are equal, round, and reactive to light. No scleral icterus.  Neck: Normal range of motion. Neck supple. Carotid bruit is not present.  Cardiovascular: Normal rate, regular rhythm and intact distal pulses.   Murmur heard. Pulses:      Radial pulses are 2+ on the right side, and 2+ on the left side.  Pulmonary/Chest: Effort normal and breath sounds normal. No respiratory distress. He has no wheezes. He has no rales.  Abdominal: Soft. Bowel sounds are normal. He exhibits no distension and no mass. There is no tenderness. There is no rebound  and no guarding.  Genitourinary:       deferred  Musculoskeletal: Normal range of motion. He exhibits no edema.  Lymphadenopathy:    He has no cervical adenopathy.  Neurological: He is alert and oriented to person, place, and time.       CN grossly intact, station and gait intact  Skin: Skin is warm and dry. No rash noted.  Psychiatric: He has a normal mood and affect. His behavior is normal. Judgment and thought content normal.      Assessment & Plan:

## 2012-03-08 NOTE — Assessment & Plan Note (Signed)
Discussed importance of hydration status as well as avoidance of NSAIDs.

## 2012-05-08 ENCOUNTER — Other Ambulatory Visit: Payer: Self-pay | Admitting: Cardiology

## 2012-05-17 ENCOUNTER — Other Ambulatory Visit: Payer: Self-pay | Admitting: Family Medicine

## 2012-05-18 ENCOUNTER — Telehealth: Payer: Self-pay | Admitting: *Deleted

## 2012-05-18 NOTE — Telephone Encounter (Signed)
Filled and placed in Kim's box. 

## 2012-05-18 NOTE — Telephone Encounter (Signed)
Form for diabetic testing supplies in your IN box 

## 2012-05-19 NOTE — Telephone Encounter (Signed)
Form faxed as directed

## 2012-06-07 ENCOUNTER — Other Ambulatory Visit: Payer: Self-pay | Admitting: Family Medicine

## 2012-06-07 NOTE — Telephone Encounter (Signed)
Yes I see where he should still have 6 months worth of refills. May send 1 mo supply w/ 1 refill to his preference of local pharmacy (check with him), and have him call [facility] in meantime again.  If we have chance can we call as well to see what the hold up is?

## 2012-06-07 NOTE — Telephone Encounter (Signed)
Pt tried to call [facility] to get his refill on Simvastatin and could not get in touch with [facility]. He was wondering what he should do because he is saying that he still has 3 refills and is completely out with meds ???

## 2012-06-08 MED ORDER — SIMVASTATIN 40 MG PO TABS: 40.0000 mg | ORAL_TABLET | Freq: Every day | ORAL | Status: DC

## 2012-06-08 NOTE — Telephone Encounter (Signed)
Patient notified as instructed by telephone. Patient stated that he does not want a script sent to a local pharmacy because it will cost too much. Patient was given the correct number to [facility] because he stated that he had the wrong number. Patient stated that he will try to contact them again and call back and advise what he is told.  Called [facility] and was on hold for over 10 minutes. Will have to try [facility] at a later time if patient does not get this taken care of.

## 2012-07-07 DIAGNOSIS — M5126 Other intervertebral disc displacement, lumbar region: Secondary | ICD-10-CM | POA: Diagnosis not present

## 2012-07-07 DIAGNOSIS — M5412 Radiculopathy, cervical region: Secondary | ICD-10-CM | POA: Diagnosis not present

## 2012-07-07 DIAGNOSIS — M62838 Other muscle spasm: Secondary | ICD-10-CM | POA: Diagnosis not present

## 2012-07-07 DIAGNOSIS — M9981 Other biomechanical lesions of cervical region: Secondary | ICD-10-CM | POA: Diagnosis not present

## 2012-07-07 DIAGNOSIS — M999 Biomechanical lesion, unspecified: Secondary | ICD-10-CM | POA: Diagnosis not present

## 2012-07-12 DIAGNOSIS — M5412 Radiculopathy, cervical region: Secondary | ICD-10-CM | POA: Diagnosis not present

## 2012-07-12 DIAGNOSIS — M999 Biomechanical lesion, unspecified: Secondary | ICD-10-CM | POA: Diagnosis not present

## 2012-07-12 DIAGNOSIS — M9981 Other biomechanical lesions of cervical region: Secondary | ICD-10-CM | POA: Diagnosis not present

## 2012-07-12 DIAGNOSIS — M62838 Other muscle spasm: Secondary | ICD-10-CM | POA: Diagnosis not present

## 2012-07-12 DIAGNOSIS — M5126 Other intervertebral disc displacement, lumbar region: Secondary | ICD-10-CM | POA: Diagnosis not present

## 2012-07-14 DIAGNOSIS — M9981 Other biomechanical lesions of cervical region: Secondary | ICD-10-CM | POA: Diagnosis not present

## 2012-07-14 DIAGNOSIS — M999 Biomechanical lesion, unspecified: Secondary | ICD-10-CM | POA: Diagnosis not present

## 2012-07-14 DIAGNOSIS — M5412 Radiculopathy, cervical region: Secondary | ICD-10-CM | POA: Diagnosis not present

## 2012-07-14 DIAGNOSIS — M5126 Other intervertebral disc displacement, lumbar region: Secondary | ICD-10-CM | POA: Diagnosis not present

## 2012-07-14 DIAGNOSIS — M62838 Other muscle spasm: Secondary | ICD-10-CM | POA: Diagnosis not present

## 2012-07-19 DIAGNOSIS — M62838 Other muscle spasm: Secondary | ICD-10-CM | POA: Diagnosis not present

## 2012-07-19 DIAGNOSIS — M5126 Other intervertebral disc displacement, lumbar region: Secondary | ICD-10-CM | POA: Diagnosis not present

## 2012-07-19 DIAGNOSIS — M999 Biomechanical lesion, unspecified: Secondary | ICD-10-CM | POA: Diagnosis not present

## 2012-07-19 DIAGNOSIS — M5412 Radiculopathy, cervical region: Secondary | ICD-10-CM | POA: Diagnosis not present

## 2012-07-19 DIAGNOSIS — M9981 Other biomechanical lesions of cervical region: Secondary | ICD-10-CM | POA: Diagnosis not present

## 2012-07-21 DIAGNOSIS — M62838 Other muscle spasm: Secondary | ICD-10-CM | POA: Diagnosis not present

## 2012-07-21 DIAGNOSIS — M999 Biomechanical lesion, unspecified: Secondary | ICD-10-CM | POA: Diagnosis not present

## 2012-07-21 DIAGNOSIS — M5126 Other intervertebral disc displacement, lumbar region: Secondary | ICD-10-CM | POA: Diagnosis not present

## 2012-07-21 DIAGNOSIS — M5412 Radiculopathy, cervical region: Secondary | ICD-10-CM | POA: Diagnosis not present

## 2012-07-21 DIAGNOSIS — M9981 Other biomechanical lesions of cervical region: Secondary | ICD-10-CM | POA: Diagnosis not present

## 2012-07-26 ENCOUNTER — Encounter: Payer: Self-pay | Admitting: Family Medicine

## 2012-07-26 ENCOUNTER — Ambulatory Visit (INDEPENDENT_AMBULATORY_CARE_PROVIDER_SITE_OTHER): Payer: Medicare Other | Admitting: Family Medicine

## 2012-07-26 ENCOUNTER — Telehealth: Payer: Self-pay

## 2012-07-26 VITALS — BP 144/70 | HR 79 | Temp 97.8°F | Wt 206.0 lb

## 2012-07-26 DIAGNOSIS — B029 Zoster without complications: Secondary | ICD-10-CM

## 2012-07-26 MED ORDER — VALACYCLOVIR HCL 1 G PO TABS: 1000.0000 mg | ORAL_TABLET | Freq: Two times a day (BID) | ORAL | Status: DC

## 2012-07-26 NOTE — Patient Instructions (Addendum)
Take valtrex twice a day for 7 days.  The rash should gradually get better.  If you have pain, let us know.

## 2012-07-26 NOTE — Telephone Encounter (Signed)
Rash on rt upper leg started with one spot on 07/22/12. Now rash on rt upper leg,hip and lower back.Pt to see Dr Alphonsus Sias today at 3:45pm.

## 2012-07-27 DIAGNOSIS — B029 Zoster without complications: Secondary | ICD-10-CM | POA: Insufficient documentation

## 2012-07-27 HISTORY — DX: Zoster without complications: B02.9

## 2012-07-27 NOTE — Progress Notes (Signed)
Nonitchy, nonpainful dermatomal rash on R buttock thigh and groin.  No other rash.  No other complaints.   Meds, vitals, and allergies reviewed.   ROS: See HPI.  Otherwise, noncontributory.  nad Red dermatomal rash on R buttock thigh and groin

## 2012-07-27 NOTE — Assessment & Plan Note (Signed)
Fortunately w/o pain.  Would start valtrex.  Path/phys d/w pt.  He understood.  If painful, then notify us.

## 2012-11-24 ENCOUNTER — Other Ambulatory Visit: Payer: Self-pay

## 2012-11-24 ENCOUNTER — Telehealth: Payer: Self-pay | Admitting: Cardiology

## 2012-11-24 DIAGNOSIS — I359 Nonrheumatic aortic valve disorder, unspecified: Secondary | ICD-10-CM

## 2012-11-24 MED ORDER — "INSULIN SYRINGE-NEEDLE U-100 30G X 1/2"" 1 ML MISC": Status: DC

## 2012-11-24 NOTE — Telephone Encounter (Signed)
Pt request refill ultrafine 30 G x 1/2" syring to [facility]). Pt notified done.

## 2012-11-25 NOTE — Telephone Encounter (Signed)
Spoke with pt, Follow up scheduled with echo scheduled the same day

## 2012-12-24 ENCOUNTER — Other Ambulatory Visit: Payer: Self-pay | Admitting: Family Medicine

## 2012-12-28 ENCOUNTER — Other Ambulatory Visit: Payer: Self-pay | Admitting: Family Medicine

## 2013-01-11 ENCOUNTER — Ambulatory Visit (HOSPITAL_COMMUNITY): Payer: Medicare Other | Attending: Cardiovascular Disease | Admitting: Radiology

## 2013-01-11 ENCOUNTER — Encounter: Payer: Self-pay | Admitting: Cardiology

## 2013-01-11 ENCOUNTER — Ambulatory Visit (INDEPENDENT_AMBULATORY_CARE_PROVIDER_SITE_OTHER): Payer: Medicare Other | Admitting: Cardiology

## 2013-01-11 VITALS — BP 152/79 | HR 59 | Ht 69.0 in | Wt 206.0 lb

## 2013-01-11 DIAGNOSIS — I2510 Atherosclerotic heart disease of native coronary artery without angina pectoris: Secondary | ICD-10-CM

## 2013-01-11 DIAGNOSIS — I359 Nonrheumatic aortic valve disorder, unspecified: Secondary | ICD-10-CM | POA: Diagnosis not present

## 2013-01-11 DIAGNOSIS — I10 Essential (primary) hypertension: Secondary | ICD-10-CM | POA: Diagnosis not present

## 2013-01-11 DIAGNOSIS — I679 Cerebrovascular disease, unspecified: Secondary | ICD-10-CM

## 2013-01-11 DIAGNOSIS — Z954 Presence of other heart-valve replacement: Secondary | ICD-10-CM | POA: Diagnosis not present

## 2013-01-11 DIAGNOSIS — Z87891 Personal history of nicotine dependence: Secondary | ICD-10-CM | POA: Insufficient documentation

## 2013-01-11 DIAGNOSIS — E785 Hyperlipidemia, unspecified: Secondary | ICD-10-CM | POA: Diagnosis not present

## 2013-01-11 DIAGNOSIS — E669 Obesity, unspecified: Secondary | ICD-10-CM | POA: Insufficient documentation

## 2013-01-11 DIAGNOSIS — E7800 Pure hypercholesterolemia, unspecified: Secondary | ICD-10-CM | POA: Diagnosis not present

## 2013-01-11 NOTE — Assessment & Plan Note (Signed)
Continue statin. Check lipids and liver. 

## 2013-01-11 NOTE — Assessment & Plan Note (Signed)
Continue present medications. Check potassium and renal function. 

## 2013-01-11 NOTE — Assessment & Plan Note (Signed)
Continue SBE prophylaxis. Repeat echocardiogram today.

## 2013-01-11 NOTE — Assessment & Plan Note (Signed)
Continue aspirin and statin. 

## 2013-01-11 NOTE — Patient Instructions (Addendum)
**Note De-Identified  Obfuscation** Your physician has requested that you have a carotid duplex. This test is an ultrasound of the carotid arteries in your neck. It looks at blood flow through these arteries that supply the brain with blood. Allow one hour for this exam. There are no restrictions or special instructions.  Your physician recommends that you return for lab work in: on same day as your Carotid Duplex. Please do not eat or drink after midnight the night before lab draw. (may drink enough water to take medications).  Your physician recommends that you continue on your current medications as directed. Please refer to the Current Medication list given to you today.  Your physician wants you to follow-up in: 1 year. You will receive a reminder letter in the mail two months in advance. If you don't receive a letter, please call our office to schedule the follow-up appointment.

## 2013-01-11 NOTE — Assessment & Plan Note (Signed)
Continue aspirin and statin. Schedule followup carotid Dopplers. 

## 2013-01-11 NOTE — Progress Notes (Signed)
HPI: Eric [name] is a pleasant gentleman who I have followed for aortic stenosis and coronary artery disease. His previous workup showed three-vessel coronary disease and severe aortic stenosis. The patient therefore on December 22 2008, underwent aortic valve replacement with #25-mm [facility] Ease pericardial tissue valve as well as coronary artery bypassing graft with a LIMA to the LAD, saphenous vein graft to the circ marginal and the saphenous vein graft to the PDA. Carotid Dopplers in January of 2013 showed a 40-59% right and 0-39% left stenosis. Followup recommended in one year. Echocardiogram in January of 2013 showed normal LV function, mild left ventricular hypertrophy, prosthetic aortic valve and trace aortic insufficiency. I last saw him in the summer of 2012. Since then, the patient has dyspnea with more extreme activities but not with routine activities. It is relieved with rest. It is not associated with chest pain. There is no orthopnea, PND or pedal edema. There is no syncope or palpitations. There is no exertional chest pain.    Current Outpatient Prescriptions  Medication Sig Dispense Refill  . aspirin 81 MG tablet Take 81 mg by mouth daily.        . famotidine (PEPCID) 20 MG tablet TAKE 1 TABLET DAILY  90 tablet  0  . GLIPIZIDE XL 10 MG 24 hr tablet TAKE 1 TABLET DAILY (NEED APPOINTMENT)  90 tablet  1  . INSULIN SYRINGE 1CC/29G (B-D INTEGRA INSULIN SYRINGE) 29G X 1/2" 1 ML MISC by Does not apply route as directed.        . Insulin Syringe-Needle U-100 (B-D INS SYR ULTRAFINE 1CC/30G) 30G X 1/2" 1 ML MISC USE AS DIRECTED 250.60  100 each  3  . LANTUS 100 UNIT/ML injection INJECT 28 UNITS UNDER THE SKIN EVERY MORNING  3 vial  11  . metoprolol (LOPRESSOR) 50 MG tablet TAKE 1 TABLET TWICE A DAY  180 tablet  3  . nitroGLYCERIN (NITROSTAT) 0.4 MG SL tablet Place 0.4 mg under the tongue every 5 (five) minutes as needed.        . ramipril (ALTACE) 5 MG capsule TAKE 1 CAPSULE DAILY  90  capsule  3  . simvastatin (ZOCOR) 40 MG tablet Take 1 tablet (40 mg total) by mouth at bedtime.  90 tablet  1   No current facility-administered medications for this visit.     Past Medical History  Diagnosis Date  . Cerebrovascular disease, unspecified   . Coronary atherosclerosis of unspecified type of vessel, native or graft     s/p CABG 2010  . Esophageal reflux   . Pure hypercholesterolemia   . Essential hypertension, benign   . Type II or unspecified type diabetes mellitus with neurological manifestations, not stated as uncontrolled(250.60)   . Bilateral hydrocele 3/11    [facility]  . History of aortic stenosis     s/p valve replacment 2010  . Cervicalgia   . Foraminal stenosis of cervical region     bilateral-C4-C5, C5-C6  . Colon polyp     Past Surgical History  Procedure Laterality Date  . Cataract extraction  1990's    bilateral  . Cholecystectomy  1995  . Colonoscopy  1997    Mult. polyps- [facility]  . Sphincterectomy  09/20/03    for jaundice  . Ptca  12/99    with stent  . Coronary artery bypass graft  3/10    x3-using a left internal mammary artery to the left anterior descending coronary artery, saphenous vein graft to circumflex  marginal branch, spahenous vein graft  to posterior descendingcoronary artery. Endoscopic saphenous vein harvest from bilateral thighs was done.   . Aortic valve replacement  12/2008    with pericardial tissue valve  . Carotid US  10/2009    B ICA stenosis, stable disease, rec rpt 2 years  . Hydrocele excision      bilateral ([facility])  . L leg trauma  1957    truck over left leg    History   Social History  . Marital Status: Widowed    Spouse Name: N/A    Number of Children: 3  . Years of Education: N/A   Occupational History  . retired-truck driver    Social History Main Topics  . Smoking status: Former Smoker    Quit date: 10/20/1993  . Smokeless tobacco: Never Used  . Alcohol Use: Yes     Comment:  occasional  . Drug Use: No  . Sexually Active: Not on file   Other Topics Concern  . Not on file   Social History Narrative   Caffeine: 2-3 cups coffee   Lives with son   Retired Engineer, technical sales, lives with 2 sons, 1 daughter in [place]   Activity: no regular exercise 2/2 hip pain and SOB.    ROS: some hip and knee arthralgias but no fevers or chills, productive cough, hemoptysis, dysphasia, odynophagia, melena, hematochezia, dysuria, hematuria, rash, seizure activity, orthopnea, PND, pedal edema, claudication. Remaining systems are negative.  Physical Exam: Well-developed well-nourished in no acute distress.  Skin is warm and dry.  HEENT is normal.  Neck is supple.  Chest is clear to auscultation with normal expansion.  Cardiovascular exam is regular rate and rhythm. 2/6 systolic murmur left sternal border. No diastolic murmur. Abdominal exam nontender or distended. No masses palpated. Extremities show no edema. neuro grossly intact  ECG sinus bradycardia at a rate of 59. First degree AV block. Left axis deviation, cannot rule out prior inferior infarct.

## 2013-01-11 NOTE — Progress Notes (Signed)
Echocardiogram performed.  

## 2013-01-20 ENCOUNTER — Encounter (INDEPENDENT_AMBULATORY_CARE_PROVIDER_SITE_OTHER): Payer: Medicare Other

## 2013-01-20 ENCOUNTER — Other Ambulatory Visit (INDEPENDENT_AMBULATORY_CARE_PROVIDER_SITE_OTHER): Payer: Medicare Other

## 2013-01-20 DIAGNOSIS — Z954 Presence of other heart-valve replacement: Secondary | ICD-10-CM

## 2013-01-20 DIAGNOSIS — E7800 Pure hypercholesterolemia, unspecified: Secondary | ICD-10-CM | POA: Diagnosis not present

## 2013-01-20 DIAGNOSIS — I6529 Occlusion and stenosis of unspecified carotid artery: Secondary | ICD-10-CM | POA: Diagnosis not present

## 2013-01-20 LAB — LIPID PANEL
Cholesterol: 134 mg/dL (ref 0–200)
HDL: 38.5 mg/dL — ABNORMAL LOW (ref >39.00)
Total CHOL/HDL Ratio: 3
Triglycerides: 210 mg/dL — ABNORMAL HIGH (ref 0.0–149.0)

## 2013-01-20 LAB — LDL CHOLESTEROL, DIRECT: Direct LDL: 65 mg/dL

## 2013-01-20 LAB — HEPATIC FUNCTION PANEL
ALT: 14 U/L (ref 0–53)
AST: 24 U/L (ref 0–37)
Albumin: 4.2 g/dL (ref 3.5–5.2)

## 2013-01-21 ENCOUNTER — Encounter: Payer: Self-pay | Admitting: *Deleted

## 2013-02-05 ENCOUNTER — Other Ambulatory Visit: Payer: Self-pay | Admitting: Family Medicine

## 2013-04-05 ENCOUNTER — Other Ambulatory Visit: Payer: Self-pay | Admitting: Cardiology

## 2013-04-28 ENCOUNTER — Other Ambulatory Visit: Payer: Self-pay

## 2013-05-03 ENCOUNTER — Ambulatory Visit (INDEPENDENT_AMBULATORY_CARE_PROVIDER_SITE_OTHER): Payer: Medicare Other | Admitting: Family Medicine

## 2013-05-03 ENCOUNTER — Encounter: Payer: Self-pay | Admitting: Family Medicine

## 2013-05-03 VITALS — BP 126/84 | HR 64 | Temp 98.0°F | Wt 209.5 lb

## 2013-05-03 DIAGNOSIS — N183 Chronic kidney disease, stage 3 unspecified: Secondary | ICD-10-CM

## 2013-05-03 DIAGNOSIS — E1149 Type 2 diabetes mellitus with other diabetic neurological complication: Secondary | ICD-10-CM | POA: Diagnosis not present

## 2013-05-03 DIAGNOSIS — E1140 Type 2 diabetes mellitus with diabetic neuropathy, unspecified: Secondary | ICD-10-CM

## 2013-05-03 DIAGNOSIS — L989 Disorder of the skin and subcutaneous tissue, unspecified: Secondary | ICD-10-CM

## 2013-05-03 DIAGNOSIS — E1142 Type 2 diabetes mellitus with diabetic polyneuropathy: Secondary | ICD-10-CM

## 2013-05-03 DIAGNOSIS — E7800 Pure hypercholesterolemia, unspecified: Secondary | ICD-10-CM | POA: Diagnosis not present

## 2013-05-03 DIAGNOSIS — I10 Essential (primary) hypertension: Secondary | ICD-10-CM | POA: Diagnosis not present

## 2013-05-03 LAB — COMPREHENSIVE METABOLIC PANEL
BUN: 18 mg/dL (ref 6–23)
CO2: 27 mEq/L (ref 19–32)
Creatinine, Ser: 1.9 mg/dL — ABNORMAL HIGH (ref 0.4–1.5)
GFR: 36.04 mL/min — ABNORMAL LOW (ref >60.00)
Glucose, Bld: 138 mg/dL — ABNORMAL HIGH (ref 70–99)
Total Bilirubin: 0.7 mg/dL (ref 0.3–1.2)

## 2013-05-03 LAB — HEMOGLOBIN A1C: Hgb A1c MFr Bld: 7.6 % — ABNORMAL HIGH (ref 4.6–6.5)

## 2013-05-03 MED ORDER — "INSULIN SYRINGE-NEEDLE U-100 30G X 1/2"" 1 ML MISC": Status: DC

## 2013-05-03 MED ORDER — GLIPIZIDE ER 10 MG PO TB24: ORAL_TABLET | ORAL | Status: DC

## 2013-05-03 MED ORDER — SIMVASTATIN 40 MG PO TABS: ORAL_TABLET | ORAL | Status: DC

## 2013-05-03 MED ORDER — FAMOTIDINE 20 MG PO TABS: ORAL_TABLET | ORAL | Status: DC

## 2013-05-03 NOTE — Assessment & Plan Note (Signed)
Chronic, stable. Continue meds. 

## 2013-05-03 NOTE — Patient Instructions (Addendum)
I do recommend seeing skin doctor for growth on right temple. Blood work today. Return in 6 months for follow up. No changes to medicines today

## 2013-05-03 NOTE — Progress Notes (Signed)
  Subjective:    Patient ID: Eric [name], male    DOB: September 24, 1927, 79 y.o.   MRN: [id number]  HPI CC: med refill  Eric [name] presents today for med refill visit.  Not seen since mid 2013.  DM - doesn't check sugars.  No recent A1c.  Has completed pneumovax.  No HA, vision changes, CP/tightness, SOB, leg swelling. No hypoglycemic sxs.  No paresthesias.  HTN and HLD - reports compliance with meds.  Skin wound present on right temple region, for at least several months.  Lab Results  Component Value Date   HGBA1C 7.4* 12/01/2011   Past Medical History  Diagnosis Date  . Cerebrovascular disease, unspecified   . Coronary atherosclerosis of unspecified type of vessel, native or graft     s/p CABG 2010  . Esophageal reflux   . Pure hypercholesterolemia   . Essential hypertension, benign   . Type II or unspecified type diabetes mellitus with neurological manifestations, not stated as uncontrolled(250.60)   . Bilateral hydrocele 3/11    [facility]  . History of aortic stenosis     s/p valve replacment 2010  . Cervicalgia   . Foraminal stenosis of cervical region     bilateral-C4-C5, C5-C6  . Colon polyp      Review of Systems Per HPI    Objective:   Physical Exam  Nursing note and vitals reviewed. Constitutional: He appears well-developed and well-nourished. No distress.  HENT:  Head: Normocephalic and atraumatic.  Right Ear: External ear normal.  Left Ear: External ear normal.  Nose: Nose normal.  Mouth/Throat: Oropharynx is clear and moist. No oropharyngeal exudate.  Eyes: Conjunctivae and EOM are normal. Pupils are equal, round, and reactive to light. No scleral icterus.  Neck: Normal range of motion. Neck supple.  Cardiovascular: Normal rate, normal heart sounds and intact distal pulses.  An irregular rhythm present.  No murmur heard. Pulmonary/Chest: Effort normal and breath sounds normal. No respiratory distress. He has no wheezes. He has no rales.   Musculoskeletal: He exhibits no edema.  Diabetic foot exam: Hallux valgus deformity bilaterally. No skin breakdown No calluses  Normal DP/PT pulses Normal sensation to light touch and monofilament Nails onychomycotic and thickened  Lymphadenopathy:    He has no cervical adenopathy.  Skin: Skin is warm and dry. No rash noted.  Poorly healing skin wound on right temple  Psychiatric: He has a normal mood and affect.        Assessment & Plan:

## 2013-05-03 NOTE — Assessment & Plan Note (Signed)
Check blood work today. Goal for him <8%.  Does not check cbg's. Reports compliance with meds.

## 2013-05-03 NOTE — Assessment & Plan Note (Signed)
On right temple - longstanding, present >1 year, poorly healing. I recommended dermatology referral for evaluation, concern for skin carcinoma.  Pt declines.

## 2013-05-03 NOTE — Assessment & Plan Note (Signed)
Continue statin.  Recent FLP with hypertriglyceridemia but LDL at goal.

## 2013-05-03 NOTE — Assessment & Plan Note (Signed)
H/o this.  Recheck Cr today.

## 2013-05-04 ENCOUNTER — Encounter: Payer: Self-pay | Admitting: *Deleted

## 2013-05-19 ENCOUNTER — Other Ambulatory Visit: Payer: Self-pay | Admitting: Family Medicine

## 2013-08-04 ENCOUNTER — Telehealth: Payer: Self-pay | Admitting: Family Medicine

## 2013-08-04 NOTE — Telephone Encounter (Signed)
The patient came in and is hoping to get a refill on Famotidine tabs 20mg .  It was sent in through [facility] in July, however, he states this medication was never delivered. He was advised to call the pharmacy directly and inquire why the did not send the medication, but he states he is unable to do that.  The patient's callback - [phone].   Thanks!

## 2013-08-09 ENCOUNTER — Other Ambulatory Visit: Payer: Self-pay | Admitting: *Deleted

## 2013-08-09 MED ORDER — FAMOTIDINE 20 MG PO TABS: ORAL_TABLET | ORAL | Status: DC

## 2013-08-09 NOTE — Telephone Encounter (Signed)
Resent medication to pharmacy. Patient notified. Pharmacy advised it was not received in July.

## 2013-08-31 ENCOUNTER — Telehealth: Payer: Self-pay

## 2013-08-31 NOTE — Telephone Encounter (Signed)
Pt has available refills on simvastatin but [facility]; [facility] and pt does have refills and will send out 90 day supply; pt will receive in 3-5 days. Delice Bison said pt will put simvastatin on automatic reorder for simvastatin per pt request. Pt notified done.

## 2013-10-06 ENCOUNTER — Telehealth: Payer: Self-pay | Admitting: Family Medicine

## 2013-10-06 ENCOUNTER — Telehealth: Payer: Self-pay

## 2013-10-06 NOTE — Telephone Encounter (Signed)
See [facility] note 

## 2013-10-06 NOTE — Telephone Encounter (Signed)
Patient Information:  Caller Name: [place]  Phone: [phone]  Patient: Eric [name], Eric [name]  Gender: Male  DOB: [birth year]  Age: 79 Years  PCP: Carlus Pavlov (Endocrinology at [facility] office)  Office Follow Up:  Does the office need to follow up with this patient?: No  Instructions For The Office: N/A  RN Note:  Notified the office to see if they could see him for this or if he should go to the ED. Dr. Merrily Pew he be seen at [facility] ED. Pt. aware.  Symptoms  Reason For Call & Symptoms: Called in with numbness of Rt. hand and elbow. Complaining of weakness of both legs. Fell over the weekend x 2. States legs just gave out on him.  Reviewed Health History In EMR: Yes  Reviewed Medications In EMR: Yes  Reviewed Allergies In EMR: Yes  Reviewed Surgeries / Procedures: Yes  Date of Onset of Symptoms: 10/06/2013  Guideline(s) Used:  Neurologic Deficit  Disposition Per Guideline:   Go to ED Now (or to Office with PCP Approval)  Reason For Disposition Reached:   Can't use hand normally (e.g., hold a glass of water)  Advice Given:  Call Back If:  Symptoms do not go away within 30 minutes  You become worse.  Patient Will Follow Care Advice:  YES

## 2013-10-06 NOTE — Telephone Encounter (Signed)
plz call tomorrow for an update. 

## 2013-10-06 NOTE — Telephone Encounter (Signed)
Eric [name] with [facility] said pt fell x 2 over weekend with leg weakness; now pt having numbness in rt hand and elbow (this symptom is new). No H/A,CP, SOB,dizziness slurred speech or confusion.No available appts. Dr Reece Agar said pt should go to ED for eval now.Eric [name] will let pt know.

## 2013-10-07 NOTE — Telephone Encounter (Signed)
Attempted to call patient. No answer and VM hasn't been set up. Will try again later.

## 2013-10-11 NOTE — Telephone Encounter (Signed)
Noted. Will see when he calls to come in.

## 2013-10-11 NOTE — Telephone Encounter (Signed)
Spoke with patient. He said his daughter was going to take him to the hospital last week, but she called the ER and was told that someone from here had to call them first before he could go, so he never went. I told him that was absolutely not true and that he could go to the ER whenever he felt that he needed to go and never needed [place] to call them ahead of time. He said he thought so, but was going by what his daughter said. He said he still has numbness in his middle, ring and little finger and still has some weakness in his legs but hasn't fallen anymore. I offered an appointment for tomorrow and he refused numerous times stating he would call back after Christmas if he still had the numbness for an appt. I advised he soul be evaluated sooner than later and he verbalized understanding but still refused.

## 2013-10-28 ENCOUNTER — Telehealth: Payer: Self-pay

## 2013-10-28 NOTE — Telephone Encounter (Signed)
Pt came to office to get refills on pepcid,glipizide,zocor and insulin syringes. Advised pt [facility]. Pt will contact pharmacy.

## 2013-11-03 ENCOUNTER — Ambulatory Visit (HOSPITAL_COMMUNITY)
Admission: RE | Admit: 2013-11-03 | Discharge: 2013-11-03 | Disposition: A | Discharge status: Home / Self Care | Payer: Medicare Other | Source: Ambulatory Visit | Attending: Family Medicine | Admitting: Family Medicine

## 2013-11-03 ENCOUNTER — Ambulatory Visit: Admission: RE | Admit: 2013-11-03 | Payer: Medicare Other | Source: Ambulatory Visit

## 2013-11-03 ENCOUNTER — Ambulatory Visit (INDEPENDENT_AMBULATORY_CARE_PROVIDER_SITE_OTHER): Payer: Medicare Other | Admitting: Family Medicine

## 2013-11-03 ENCOUNTER — Ambulatory Visit (INDEPENDENT_AMBULATORY_CARE_PROVIDER_SITE_OTHER)
Admission: RE | Admit: 2013-11-03 | Discharge: 2013-11-03 | Disposition: A | Discharge status: Home / Self Care | Payer: Medicare Other | Source: Ambulatory Visit | Attending: Family Medicine | Admitting: Family Medicine

## 2013-11-03 ENCOUNTER — Encounter: Payer: Self-pay | Admitting: Family Medicine

## 2013-11-03 VITALS — BP 118/60 | HR 68 | Temp 97.6°F | Wt 211.5 lb

## 2013-11-03 DIAGNOSIS — M25559 Pain in unspecified hip: Secondary | ICD-10-CM

## 2013-11-03 DIAGNOSIS — N183 Chronic kidney disease, stage 3 unspecified: Secondary | ICD-10-CM

## 2013-11-03 DIAGNOSIS — E1142 Type 2 diabetes mellitus with diabetic polyneuropathy: Secondary | ICD-10-CM | POA: Diagnosis not present

## 2013-11-03 DIAGNOSIS — E7800 Pure hypercholesterolemia, unspecified: Secondary | ICD-10-CM | POA: Diagnosis not present

## 2013-11-03 DIAGNOSIS — S51019A Laceration without foreign body of unspecified elbow, initial encounter: Secondary | ICD-10-CM | POA: Insufficient documentation

## 2013-11-03 DIAGNOSIS — S51009A Unspecified open wound of unspecified elbow, initial encounter: Secondary | ICD-10-CM

## 2013-11-03 DIAGNOSIS — E1140 Type 2 diabetes mellitus with diabetic neuropathy, unspecified: Secondary | ICD-10-CM

## 2013-11-03 DIAGNOSIS — I10 Essential (primary) hypertension: Secondary | ICD-10-CM | POA: Diagnosis not present

## 2013-11-03 DIAGNOSIS — E1149 Type 2 diabetes mellitus with other diabetic neurological complication: Secondary | ICD-10-CM | POA: Diagnosis not present

## 2013-11-03 DIAGNOSIS — M1610 Unilateral primary osteoarthritis, unspecified hip: Secondary | ICD-10-CM | POA: Diagnosis not present

## 2013-11-03 DIAGNOSIS — M25552 Pain in left hip: Secondary | ICD-10-CM

## 2013-11-03 DIAGNOSIS — W19XXXA Unspecified fall, initial encounter: Secondary | ICD-10-CM | POA: Insufficient documentation

## 2013-11-03 DIAGNOSIS — M169 Osteoarthritis of hip, unspecified: Secondary | ICD-10-CM | POA: Diagnosis not present

## 2013-11-03 LAB — CBC WITH DIFFERENTIAL/PLATELET
Basophils Absolute: 0 10*3/uL (ref 0.0–0.1)
Basophils Relative: 0.2 % (ref 0.0–3.0)
EOS PCT: 3.6 % (ref 0.0–5.0)
Eosinophils Absolute: 0.2 10*3/uL (ref 0.0–0.7)
HEMATOCRIT: 33.2 % — AB (ref 39.0–52.0)
Hemoglobin: 11 g/dL — ABNORMAL LOW (ref 13.0–17.0)
LYMPHS ABS: 0.7 10*3/uL (ref 0.7–4.0)
LYMPHS PCT: 12 % (ref 12.0–46.0)
MCHC: 33.3 g/dL (ref 30.0–36.0)
MCV: 93.1 fl (ref 78.0–100.0)
MONOS PCT: 8.9 % (ref 3.0–12.0)
Monocytes Absolute: 0.5 10*3/uL (ref 0.1–1.0)
Neutro Abs: 4.2 10*3/uL (ref 1.4–7.7)
Neutrophils Relative %: 75.3 % (ref 43.0–77.0)
PLATELETS: 183 10*3/uL (ref 150.0–400.0)
RBC: 3.56 Mil/uL — ABNORMAL LOW (ref 4.22–5.81)
RDW: 13.7 % (ref 11.5–14.6)
WBC: 5.5 10*3/uL (ref 4.5–10.5)

## 2013-11-03 LAB — COMPREHENSIVE METABOLIC PANEL
ALK PHOS: 69 U/L (ref 39–117)
ALT: 11 U/L (ref 0–53)
AST: 22 U/L (ref 0–37)
Albumin: 3.7 g/dL (ref 3.5–5.2)
BILIRUBIN TOTAL: 0.7 mg/dL (ref 0.3–1.2)
BUN: 16 mg/dL (ref 6–23)
CO2: 26 mEq/L (ref 19–32)
Calcium: 8.9 mg/dL (ref 8.4–10.5)
Chloride: 102 mEq/L (ref 96–112)
Creatinine, Ser: 1.8 mg/dL — ABNORMAL HIGH (ref 0.4–1.5)
GFR: 37.83 mL/min — ABNORMAL LOW (ref >60.00)
GLUCOSE: 110 mg/dL — AB (ref 70–99)
POTASSIUM: 4.2 meq/L (ref 3.5–5.1)
Sodium: 135 mEq/L (ref 135–145)
Total Protein: 7 g/dL (ref 6.0–8.3)

## 2013-11-03 LAB — LIPID PANEL
Cholesterol: 113 mg/dL (ref 0–200)
HDL: 38.7 mg/dL — AB (ref >39.00)
LDL Cholesterol: 46 mg/dL (ref 0–99)
Total CHOL/HDL Ratio: 3
Triglycerides: 141 mg/dL (ref 0.0–149.0)
VLDL: 28.2 mg/dL (ref 0.0–40.0)

## 2013-11-03 LAB — TSH: TSH: 1.48 u[IU]/mL (ref 0.35–5.50)

## 2013-11-03 LAB — HEMOGLOBIN A1C: HEMOGLOBIN A1C: 7.3 % — AB (ref 4.6–6.5)

## 2013-11-03 MED ORDER — SIMVASTATIN 40 MG PO TABS: ORAL_TABLET | ORAL | Status: DC

## 2013-11-03 MED ORDER — "INSULIN SYRINGE-NEEDLE U-100 30G X 1/2"" 1 ML MISC": Status: DC

## 2013-11-03 MED ORDER — FAMOTIDINE 20 MG PO TABS: ORAL_TABLET | ORAL | Status: DC

## 2013-11-03 MED ORDER — GLIPIZIDE ER 5 MG PO TB24: 5.0000 mg | ORAL_TABLET | Freq: Every day | ORAL | Status: DC

## 2013-11-03 NOTE — Assessment & Plan Note (Signed)
I do not think Eric [name] has had an acute stroke to explain his recent falls.  I think rather this is all related to his significant osteoarthritis leading to pain and lower extremity weakness. Xrays of hips today. I recommended continued regular use of walker and cane at home and when ambulating. See above for pain regimen recommendations. Declines HH PT referral for fall prevention and leg strengthening.  "i've been managing well on my own"

## 2013-11-03 NOTE — Patient Instructions (Addendum)
Let's decrease glipizide xr to 5mg  daily as I worry that low sugars may be contributing to your falls. Let's get L hip xray today - looks like large amount of arthritis causing your left hip pain, but no fracture.  I will await radiologist evaluation of xrays.  Start tylenol or acetaminophen 500mg  2 tablets twice a day for pain.  Let me know if you want something stronger. Return to see me in 1-2 months, bring all your medicines to your next office visit (including over the counter medicines). I want you to avoid aleve, advil, ibuprofen and motrin.

## 2013-11-03 NOTE — Assessment & Plan Note (Addendum)
xrays show significant arthritis of L hip - ?AVN.  Will await radiologist eval. Recommended tylenol 1000mg  bid.  Pt declines stronger pain med today. Anticipate falls from weakness due to pain from marked arthritis evidenced on xray today Recommended avoiding NSAIDs 2/2 CKD, and start scheduled tylenol 500-1000mg  bid.   Pt declines stronger pain medication today.

## 2013-11-03 NOTE — Assessment & Plan Note (Signed)
Re-approximated skin with steri strips.  Home wound care of steri strips discussed.

## 2013-11-03 NOTE — Assessment & Plan Note (Signed)
Endorses compliance with lantus and sulfonylurea - however there is concern for hypoglycemia contribution to falls. Will decrease sulfonylurea to 5mg  daily. Pt agrees. Goal <8%. Pt does not check sugars. I asked him to return in 1-2 months for DM f/u

## 2013-11-03 NOTE — Progress Notes (Signed)
Subjective:    Patient ID: Eric [name], male    DOB: [birth year], 80 y.o.   MRN: [id number]  HPI CC: eval falls/numbness  Eric [name] has h/o AS s/p AVR, 3v CAD, HTN, HLD, DM.  Pt drove himself here today.  Presents in wheelchair but he walked into the office with a cane.  He also has walker he uses at home.  Pt called [facility] line 10/06/2013 with complaint of falls x2 and R arm/elbow numbness.  Advised to seek ER care but pt declined.  Offered appt, pt did not agree to come in until today for eval.  Endorses left leg weakness with some pain at hip and knee.  Also noticing some dizziness/imbalance.   Occasional R hand numbness at thumb and 2nd/3rd fingers with pain over the last few years.  Has been told has R CTS in past. Wears wrist brace at night time. L leg pain at hip and knee- ongoing over last several months, since he fell 1 year ago on L buttock onto pocket book.  Pain getting worse. Controlling pain with icy hot and some over the counter analgesic.  H/o L leg trauma 1957 s/p surgery.   Denies fevers/chills, presyncope or LOC, chest pain or dyspnea, palpitations.   He has not been regular with office visits and has declined therapies in the past. DM - On lantus 28 units every night and glipizide xr 10mg  daily.  This has been a long term regimen.  He does not check his sugars. Due for f/u with cards.  Lab Results  Component Value Date   HGBA1C 7.6* 05/03/2013    BP Readings from Last 3 Encounters:  11/03/13 118/60  05/03/13 126/84  01/11/13 152/79   Wt Readings from Last 3 Encounters:  11/03/13 211 lb 8 oz (95.936 kg)  05/03/13 209 lb 8 oz (95.029 kg)  01/11/13 206 lb (93.441 kg)    Past Medical History  Diagnosis Date  . Cerebrovascular disease, unspecified   . Coronary atherosclerosis of unspecified type of vessel, native or graft     s/p CABG 2010  . Esophageal reflux   . Pure hypercholesterolemia   . Essential hypertension, benign   . Type II or unspecified  type diabetes mellitus with neurological manifestations, not stated as uncontrolled   . Bilateral hydrocele 3/11    [facility]  . History of aortic stenosis     s/p valve replacment 2010  . Cervicalgia   . Foraminal stenosis of cervical region     bilateral-C4-C5, C5-C6  . Colon polyp     Past Surgical History  Procedure Laterality Date  . Cataract extraction  1990's    bilateral  . Cholecystectomy  1995  . Colonoscopy  1997    Mult. polyps- [facility]  . Sphincterectomy  09/20/03    for jaundice  . Ptca  12/99    with stent  . Coronary artery bypass graft  3/10    x3-using a left internal mammary artery to the left anterior descending coronary artery, saphenous vein graft to circumflex marginal branch, spahenous vein graft  to posterior descendingcoronary artery. Endoscopic saphenous vein harvest from bilateral thighs was done.   . Aortic valve replacement  12/2008    with pericardial tissue valve  . Carotid US  10/2009    B ICA stenosis, stable disease, rec rpt 2 years  . Hydrocele excision      bilateral ([facility])  . L leg trauma  1957    truck over  left leg   Review of Systems Per HPI    Objective:   Physical Exam  Nursing note and vitals reviewed. Constitutional: He is oriented to person, place, and time. He appears well-developed and well-nourished. No distress.  HENT:  Head: Normocephalic and atraumatic.  Mouth/Throat: Uvula is midline and oropharynx is clear and moist. No oropharyngeal exudate.  Neck: Carotid bruit is not present.  Cardiovascular: Normal rate, regular rhythm, normal heart sounds and intact distal pulses.   No murmur heard. Mechanical heart sound  Pulmonary/Chest: Effort normal and breath sounds normal. No respiratory distress. He has no wheezes. He has no rales.  Musculoskeletal: He exhibits edema (tr ).  Tender at L sciatic notch and L GTB. Stiff and tender L hip and knee.  Neurological: He is oriented to person, place, and time.    5/5 strength BUE and BLE Sensation intact throughout. No slurred speech Very antalgic gait 2/2 L hip pain       Assessment & Plan:  During checkout, pt fell out of wheelchair onto floor ("both legs gave out"), hit his right elbow with resulting laceration.  No LOC, did not hit head.  He had a 2cm checkmark laceration just distal to lateral elbow that was repaired with steri strips (after cleaning with betadine and irrigating with normal saline).  Wrapped with nonstick gauze and kerlex.  Discussed wound care with patient.

## 2013-11-03 NOTE — Progress Notes (Signed)
Pre-visit discussion using our clinic review tool. No additional management support is needed unless otherwise documented below in the visit note.  

## 2013-12-03 ENCOUNTER — Other Ambulatory Visit: Payer: Self-pay | Admitting: Cardiology

## 2013-12-05 ENCOUNTER — Ambulatory Visit: Payer: Medicare Other | Admitting: Family Medicine

## 2013-12-09 ENCOUNTER — Ambulatory Visit: Payer: Medicare Other | Admitting: Family Medicine

## 2014-02-07 ENCOUNTER — Other Ambulatory Visit: Payer: Self-pay | Admitting: Cardiology

## 2014-02-14 ENCOUNTER — Encounter: Payer: Self-pay | Admitting: Cardiology

## 2014-02-14 ENCOUNTER — Ambulatory Visit (INDEPENDENT_AMBULATORY_CARE_PROVIDER_SITE_OTHER): Payer: Medicare Other | Admitting: Cardiology

## 2014-02-14 ENCOUNTER — Encounter: Payer: Self-pay | Admitting: *Deleted

## 2014-02-14 VITALS — BP 119/72 | HR 67 | Ht 69.0 in | Wt 205.0 lb

## 2014-02-14 DIAGNOSIS — Z954 Presence of other heart-valve replacement: Secondary | ICD-10-CM

## 2014-02-14 DIAGNOSIS — E7800 Pure hypercholesterolemia, unspecified: Secondary | ICD-10-CM

## 2014-02-14 DIAGNOSIS — I2510 Atherosclerotic heart disease of native coronary artery without angina pectoris: Secondary | ICD-10-CM | POA: Diagnosis not present

## 2014-02-14 DIAGNOSIS — I10 Essential (primary) hypertension: Secondary | ICD-10-CM | POA: Diagnosis not present

## 2014-02-14 DIAGNOSIS — I679 Cerebrovascular disease, unspecified: Secondary | ICD-10-CM

## 2014-02-14 NOTE — Assessment & Plan Note (Signed)
Blood pressure controlled. Continue present medications. 

## 2014-02-14 NOTE — Assessment & Plan Note (Signed)
Schedule follow-up carotid Dopplers. 

## 2014-02-14 NOTE — Patient Instructions (Signed)
Your physician wants you to follow-up in: ONE YEAR WITH DR CRENSHAW You will receive a reminder letter in the mail two months in advance. If you don't receive a letter, please call our office to schedule the follow-up appointment.   Your physician has requested that you have a carotid duplex. This test is an ultrasound of the carotid arteries in your neck. It looks at blood flow through these arteries that supply the brain with blood. Allow one hour for this exam. There are no restrictions or special instructions.   

## 2014-02-14 NOTE — Assessment & Plan Note (Signed)
Continue aspirin and statin. 

## 2014-02-14 NOTE — Assessment & Plan Note (Signed)
Continue SBE prophylaxis. 

## 2014-02-14 NOTE — Progress Notes (Signed)
HPI: FU AVR and coronary artery disease. His previous workup showed three-vessel coronary disease and severe aortic stenosis. The patient therefore on December 22 2008, underwent aortic valve replacement with #25-mm [facility] Ease pericardial tissue valve as well as coronary artery bypassing graft with a LIMA to the LAD, saphenous vein graft to the circ marginal and the saphenous vein graft to the PDA. Echo March 2014 showed normal LV function, prosthetic aortic valve with mean grad 15 mmHg, biatrial enlargement and mild MR. Carotid Dopplers in April 2014 showed a 60-79% right and 0-39% left stenosis. Followup recommended in six months. I last saw him in March of 2014. Since then, He denies dyspnea, chest pain, palpitations or syncope. Occasional mild pedal edema.   Current Outpatient Prescriptions  Medication Sig Dispense Refill  . aspirin 81 MG tablet Take 81 mg by mouth daily.        . famotidine (PEPCID) 20 MG tablet TAKE 1 TABLET DAILY  90 tablet  3  . glipiZIDE (GLIPIZIDE XL) 5 MG 24 hr tablet Take 1 tablet (5 mg total) by mouth daily with breakfast.  90 tablet  3  . insulin glargine (LANTUS) 100 UNIT/ML injection INJECT 28 UNITS UNDER THE SKIN EVERY EVENING      . Insulin Syringe-Needle U-100 (B-D INS SYR ULTRAFINE 1CC/30G) 30G X 1/2" 1 ML MISC USE AS DIRECTED 250.60  100 each  3  . metoprolol (LOPRESSOR) 50 MG tablet TAKE 1 TABLET TWICE A DAY  180 tablet  0  . nitroGLYCERIN (NITROSTAT) 0.4 MG SL tablet Place 0.4 mg under the tongue every 5 (five) minutes as needed.        . ramipril (ALTACE) 5 MG capsule TAKE 1 CAPSULE DAILY  90 capsule  0  . simvastatin (ZOCOR) 40 MG tablet TAKE 1 TABLET AT BEDTIME  90 tablet  3   No current facility-administered medications for this visit.     Past Medical History  Diagnosis Date  . Cerebrovascular disease, unspecified   . Coronary atherosclerosis of unspecified type of vessel, native or graft     s/p CABG 2010  . Esophageal reflux   .  Pure hypercholesterolemia   . Essential hypertension, benign   . Type II or unspecified type diabetes mellitus with neurological manifestations, not stated as uncontrolled   . Bilateral hydrocele 3/11    [facility]  . History of aortic stenosis     s/p valve replacment 2010  . Cervicalgia   . Foraminal stenosis of cervical region     bilateral-C4-C5, C5-C6  . Colon polyp     Past Surgical History  Procedure Laterality Date  . Cataract extraction  1990's    bilateral  . Cholecystectomy  1995  . Colonoscopy  1997    Mult. polyps- [facility]  . Sphincterectomy  09/20/03    for jaundice  . Ptca  12/99    with stent  . Coronary artery bypass graft  3/10    x3-using a left internal mammary artery to the left anterior descending coronary artery, saphenous vein graft to circumflex marginal branch, spahenous vein graft  to posterior descendingcoronary artery. Endoscopic saphenous vein harvest from bilateral thighs was done.   . Aortic valve replacement  12/2008    with pericardial tissue valve  . Carotid US  10/2009    B ICA stenosis, stable disease, rec rpt 2 years  . Hydrocele excision      bilateral ([facility])  . L leg trauma  1957    truck over left leg    History   Social History  . Marital Status: Widowed    Spouse Name: N/A    Number of Children: 3  . Years of Education: N/A   Occupational History  . retired-truck driver    Social History Main Topics  . Smoking status: Former Smoker    Quit date: 10/20/1993  . Smokeless tobacco: Never Used  . Alcohol Use: Yes     Comment: occasional  . Drug Use: No  . Sexual Activity: Not on file   Other Topics Concern  . Not on file   Social History Narrative   Caffeine: 2-3 cups coffee   Lives with son   Retired Educational psychologist, lives with 2 sons, 1 daughter in [place]   Activity: no regular exercise 2/2 hip pain and SOB.    ROS: Left leg pain but no fevers or chills, productive cough, hemoptysis,  dysphasia, odynophagia, melena, hematochezia, dysuria, hematuria, rash, seizure activity, orthopnea, PND, claudication. Remaining systems are negative.  Physical Exam: Well-developed well-nourished in no acute distress.  Skin is warm and dry.  HEENT is normal.  Neck is supple.  Chest is clear to auscultation with normal expansion.  Cardiovascular exam is regular rate and rhythm. 2/6 systolic murmur left sternal border. No diastolic murmur. Abdominal exam nontender or distended. No masses palpated. Extremities show Trace edema. neuro grossly intact  ECG Sinus rhythm at a rate of 67. First degree AV block. Cannot rule out prior septal infarct. Left anterior fascicular block

## 2014-02-14 NOTE — Assessment & Plan Note (Signed)
Continue statin. 

## 2014-02-15 ENCOUNTER — Telehealth: Payer: Self-pay

## 2014-02-15 NOTE — Telephone Encounter (Signed)
Pt request refills on famotidine; advised pt already refilled by Dr Darnell Level and Dr Stanford Breed refilled metoprolol and ramipril. Pt will ck with [facility].

## 2014-02-22 ENCOUNTER — Ambulatory Visit (HOSPITAL_COMMUNITY): Payer: Medicare Other | Attending: Cardiology | Admitting: *Deleted

## 2014-02-22 DIAGNOSIS — I2510 Atherosclerotic heart disease of native coronary artery without angina pectoris: Secondary | ICD-10-CM | POA: Insufficient documentation

## 2014-02-22 DIAGNOSIS — I6529 Occlusion and stenosis of unspecified carotid artery: Secondary | ICD-10-CM | POA: Diagnosis not present

## 2014-02-22 DIAGNOSIS — E119 Type 2 diabetes mellitus without complications: Secondary | ICD-10-CM | POA: Insufficient documentation

## 2014-02-22 DIAGNOSIS — E785 Hyperlipidemia, unspecified: Secondary | ICD-10-CM | POA: Diagnosis not present

## 2014-02-22 DIAGNOSIS — I658 Occlusion and stenosis of other precerebral arteries: Secondary | ICD-10-CM | POA: Insufficient documentation

## 2014-02-22 DIAGNOSIS — I10 Essential (primary) hypertension: Secondary | ICD-10-CM | POA: Insufficient documentation

## 2014-02-22 DIAGNOSIS — I679 Cerebrovascular disease, unspecified: Secondary | ICD-10-CM

## 2014-02-22 DIAGNOSIS — Z87891 Personal history of nicotine dependence: Secondary | ICD-10-CM | POA: Diagnosis not present

## 2014-02-22 NOTE — Progress Notes (Signed)
Carotid duplex complete 

## 2014-02-23 NOTE — Progress Notes (Signed)
Quick Note:  Fu carotid dopplers six months Eric [name]  ______

## 2014-03-22 DIAGNOSIS — M545 Low back pain, unspecified: Secondary | ICD-10-CM | POA: Diagnosis not present

## 2014-03-22 DIAGNOSIS — M5137 Other intervertebral disc degeneration, lumbosacral region: Secondary | ICD-10-CM | POA: Diagnosis not present

## 2014-03-22 DIAGNOSIS — M1710 Unilateral primary osteoarthritis, unspecified knee: Secondary | ICD-10-CM | POA: Diagnosis not present

## 2014-04-12 DIAGNOSIS — M169 Osteoarthritis of hip, unspecified: Secondary | ICD-10-CM | POA: Diagnosis not present

## 2014-04-12 DIAGNOSIS — M1610 Unilateral primary osteoarthritis, unspecified hip: Secondary | ICD-10-CM | POA: Diagnosis not present

## 2014-04-12 DIAGNOSIS — M1710 Unilateral primary osteoarthritis, unspecified knee: Secondary | ICD-10-CM | POA: Diagnosis not present

## 2014-04-18 DIAGNOSIS — M169 Osteoarthritis of hip, unspecified: Secondary | ICD-10-CM | POA: Diagnosis not present

## 2014-04-18 DIAGNOSIS — M1610 Unilateral primary osteoarthritis, unspecified hip: Secondary | ICD-10-CM | POA: Diagnosis not present

## 2014-04-18 DIAGNOSIS — M25559 Pain in unspecified hip: Secondary | ICD-10-CM | POA: Diagnosis not present

## 2014-05-03 DIAGNOSIS — M25559 Pain in unspecified hip: Secondary | ICD-10-CM | POA: Diagnosis not present

## 2014-05-03 DIAGNOSIS — M169 Osteoarthritis of hip, unspecified: Secondary | ICD-10-CM | POA: Diagnosis not present

## 2014-05-03 DIAGNOSIS — M1610 Unilateral primary osteoarthritis, unspecified hip: Secondary | ICD-10-CM | POA: Diagnosis not present

## 2014-05-06 ENCOUNTER — Other Ambulatory Visit: Payer: Self-pay | Admitting: Cardiology

## 2014-05-22 ENCOUNTER — Other Ambulatory Visit: Payer: Self-pay | Admitting: Family Medicine

## 2014-05-30 ENCOUNTER — Telehealth: Payer: Self-pay | Admitting: Cardiology

## 2014-05-30 NOTE — Telephone Encounter (Signed)
Pt's daughter Eric [name] called in stating that Eric [name] is Eric [name] have a hip replacement done soon and is going to need medical clearance. Please call  Thanks

## 2014-05-30 NOTE — Telephone Encounter (Signed)
Ok for hip replacement Eric [name]

## 2014-05-30 NOTE — Telephone Encounter (Signed)
called informed daughter. Per Dr Stanford Breed okay for  Surgery. Will fax letter/note to Dr Dola [facility] via [facility]- FAXED

## 2014-05-30 NOTE — Telephone Encounter (Signed)
Left message  On voicemail- Who is orthopedic surgeon? please call back with info. Left message that information will be given to Dr Stanford Breed and Hilda Blades RN. Someone will contact Mrs Eric [name] with whether patient is cleared or needs an appointment.

## 2014-05-30 NOTE — Telephone Encounter (Signed)
Follow Up   States the orthopedic surgeon is Dr. Jean Rosenthal with [facility]

## 2014-06-05 ENCOUNTER — Other Ambulatory Visit (HOSPITAL_COMMUNITY): Payer: Self-pay | Admitting: Orthopaedic Surgery

## 2014-06-06 ENCOUNTER — Other Ambulatory Visit (HOSPITAL_COMMUNITY): Payer: Self-pay | Admitting: Orthopaedic Surgery

## 2014-06-19 ENCOUNTER — Encounter (HOSPITAL_COMMUNITY): Payer: Self-pay | Admitting: Pharmacy Technician

## 2014-06-21 ENCOUNTER — Other Ambulatory Visit (HOSPITAL_COMMUNITY): Payer: Self-pay | Admitting: *Deleted

## 2014-06-22 ENCOUNTER — Encounter (HOSPITAL_COMMUNITY): Payer: Self-pay

## 2014-06-22 ENCOUNTER — Encounter (HOSPITAL_COMMUNITY)
Admission: RE | Admit: 2014-06-22 | Discharge: 2014-06-22 | Disposition: A | Discharge status: Home / Self Care | Payer: Medicare Other | Source: Ambulatory Visit | Attending: Orthopaedic Surgery | Admitting: Orthopaedic Surgery

## 2014-06-22 ENCOUNTER — Ambulatory Visit (HOSPITAL_COMMUNITY)
Admission: RE | Admit: 2014-06-22 | Discharge: 2014-06-22 | Disposition: A | Discharge status: Home / Self Care | Payer: Medicare Other | Source: Ambulatory Visit | Attending: Anesthesiology | Admitting: Anesthesiology

## 2014-06-22 DIAGNOSIS — I10 Essential (primary) hypertension: Secondary | ICD-10-CM | POA: Diagnosis not present

## 2014-06-22 DIAGNOSIS — R0602 Shortness of breath: Secondary | ICD-10-CM | POA: Diagnosis not present

## 2014-06-22 LAB — BASIC METABOLIC PANEL
Anion gap: 18 — ABNORMAL HIGH (ref 5–15)
BUN: 20 mg/dL (ref 6–23)
CALCIUM: 9.6 mg/dL (ref 8.4–10.5)
CO2: 22 mEq/L (ref 19–32)
Chloride: 98 mEq/L (ref 96–112)
Creatinine, Ser: 1.82 mg/dL — ABNORMAL HIGH (ref 0.50–1.35)
GFR calc non Af Amer: 32 mL/min — ABNORMAL LOW (ref >90)
GFR, EST AFRICAN AMERICAN: 37 mL/min — AB (ref >90)
Glucose, Bld: 133 mg/dL — ABNORMAL HIGH (ref 70–99)
POTASSIUM: 4.8 meq/L (ref 3.7–5.3)
Sodium: 138 mEq/L (ref 137–147)

## 2014-06-22 LAB — CBC
HCT: 36.6 % — ABNORMAL LOW (ref 39.0–52.0)
Hemoglobin: 11.7 g/dL — ABNORMAL LOW (ref 13.0–17.0)
MCH: 30.6 pg (ref 26.0–34.0)
MCHC: 32 g/dL (ref 30.0–36.0)
MCV: 95.8 fL (ref 78.0–100.0)
Platelets: 179 10*3/uL (ref 150–400)
RBC: 3.82 MIL/uL — ABNORMAL LOW (ref 4.22–5.81)
RDW: 13.5 % (ref 11.5–15.5)
WBC: 6.9 10*3/uL (ref 4.0–10.5)

## 2014-06-22 LAB — URINE MICROSCOPIC-ADD ON

## 2014-06-22 LAB — SURGICAL PCR SCREEN
MRSA, PCR: NEGATIVE
STAPHYLOCOCCUS AUREUS: POSITIVE — AB

## 2014-06-22 LAB — URINALYSIS, ROUTINE W REFLEX MICROSCOPIC
Bilirubin Urine: NEGATIVE
Glucose, UA: NEGATIVE mg/dL
Hgb urine dipstick: NEGATIVE
Ketones, ur: NEGATIVE mg/dL
LEUKOCYTES UA: NEGATIVE
NITRITE: NEGATIVE
PH: 5.5 (ref 5.0–8.0)
Protein, ur: 30 mg/dL — AB
SPECIFIC GRAVITY, URINE: 1.023 (ref 1.005–1.030)
UROBILINOGEN UA: 0.2 mg/dL (ref 0.0–1.0)

## 2014-06-22 LAB — APTT: aPTT: 30 seconds (ref 24–37)

## 2014-06-22 LAB — PROTIME-INR
INR: 1.01 (ref 0.00–1.49)
Prothrombin Time: 13.3 seconds (ref 11.6–15.2)

## 2014-06-22 NOTE — Patient Instructions (Addendum)
20     Your procedure is scheduled on:  Friday  06/30/2014  Report to [facility] Main Entrance and follow signs to Short Stay  At [place]  AM.  Call this number if you have problems the night before or morning of surgery:    [phone]   Remember: Take 1/2 dose Lantus insulin night before surgery and No Diabetic medications the morning of surgery!          Do not eat food or drink liquids AFTER MIDNIGHT!  Take these medicines the morning of surgery with A SIP OF WATER: Metoprolol, Pepcid    [facility] IS NOT RESPONSIBLE FOR ANY BELONGINGS OR VALUABLES BROUGHT TO HOSPITAL.  Marland Kitchen  Leave suitcase in the car. After surgery it may be brought to your room.  For patients admitted to the hospital, checkout time is 11:00 AM the day of              Discharge.    DO NOT WEAR  JEWELRY,MAKE-UP,LOTIONS,POWDERS,PERFUMES,CONTACTS , DENTURES OR BRIDGEWORK ,AND DO NOT WEAR FALSE EYELASHES                                    Patients discharged the day of surgery will not be allowed to drive home.  If going home the same day of surgery, must have someone stay with you first 24 hrs.at home and arrange for someone to drive you home from the [place]: N/A   Special Instructions:              Please read over the following fact sheets that you were given:             1. [facility] - Preparing for Surgery Before surgery, you can play an important role.  Because skin is not sterile, your skin needs to be as free of germs as possible.  You can reduce the number of germs on your skin by washing with CHG (chlorahexidine gluconate) soap before surgery.  CHG is an antiseptic cleaner which kills germs and bonds with the skin to continue killing germs even after washing. Please DO NOT use if you have an allergy to CHG or antibacterial soaps.   If your skin becomes reddened/irritated stop using the CHG and inform your nurse when you arrive at Short Stay. Do not shave (including legs and underarms) for at least 48 hours prior to the first CHG shower.  You may shave your face/neck. Please follow these instructions carefully:  1.  Shower with CHG Soap the night before surgery and the  morning of Surgery.  2.  If you choose to wash your hair, wash your hair first as usual with your  normal  shampoo.  3.  After you shampoo, rinse your hair and body thoroughly to remove the  shampoo.                           4.  Use CHG as you would any other liquid soap.  You can apply chg directly  to the skin and wash                       Gently with a scrungie or clean washcloth.  5.  Apply the CHG Soap to your body ONLY FROM THE NECK DOWN.   Do not use on face/ open                           Wound or open sores. Avoid contact with eyes, ears mouth and genitals (private parts).                       Wash face,  Genitals (private parts) with your normal soap.             6.  Wash thoroughly, paying special attention to the area where your surgery  will be performed.  7.  Thoroughly rinse your body with warm water from the neck down.  8.  DO NOT shower/wash with your normal soap after using and rinsing off  the CHG Soap.                9.  Pat yourself dry with a clean towel.            10.  Wear clean pajamas.            11.  Place clean sheets on your bed the night of your first shower and do not  sleep with pets. Day of Surgery : Do not apply any lotions/deodorants the morning of surgery.  Please wear clean clothes to the hospital/surgery center.  FAILURE TO FOLLOW THESE INSTRUCTIONS MAY RESULT IN THE CANCELLATION OF YOUR SURGERY PATIENT SIGNATURE_________________________________  NURSE SIGNATURE__________________________________  ________________________________________________________________________   Eric [name]  An incentive  spirometer is a tool that can help keep your lungs clear and active. This tool measures how well you are filling your lungs with each breath. Taking long deep breaths may help reverse or decrease the chance of developing breathing (pulmonary) problems (especially infection) following:  A long period of time when you are unable to move or be active. BEFORE THE PROCEDURE   If the spirometer includes an indicator to show your best effort, your nurse or respiratory therapist will set it to a desired goal.  If possible, sit up straight or lean slightly forward. Try not to slouch.  Hold the incentive spirometer in an upright position. INSTRUCTIONS FOR USE  1. Sit on the edge of your bed if possible, or sit up as far as you can in bed or on a chair. 2. Hold the incentive spirometer in an upright position. 3. Breathe out normally. 4. Place the mouthpiece in your mouth and seal your lips tightly around it. 5. Breathe in slowly and as deeply as possible, raising the piston or the ball toward the top of the column. 6. Hold your breath for 3-5 seconds or for as long as possible. Allow the piston or ball to fall to the bottom of the column. 7. Remove the mouthpiece from your mouth and breathe out normally. 8. Rest for a few seconds and repeat Steps 1 through 7 at least 10 times every  1-2 hours when you are awake. Take your time and take a few normal breaths between deep breaths. 9. The spirometer may include an indicator to show your best effort. Use the indicator as a goal to work toward during each repetition. 10. After each set of 10 deep breaths, practice coughing to be sure your lungs are clear. If you have an incision (the cut made at the time of surgery), support your incision when coughing by placing a pillow or rolled up towels firmly against it. Once you are able to get out of bed, walk around indoors and cough well. You may stop using the incentive spirometer when instructed by your caregiver.   RISKS AND COMPLICATIONS  Take your time so you do not get dizzy or light-headed.  If you are in pain, you may need to take or ask for pain medication before doing incentive spirometry. It is harder to take a deep breath if you are having pain. AFTER USE  Rest and breathe slowly and easily.  It can be helpful to keep track of a log of your progress. Your caregiver can provide you with a simple table to help with this. If you are using the spirometer at home, follow these instructions: [place] IF:   You are having difficultly using the spirometer.  You have trouble using the spirometer as often as instructed.  Your pain medication is not giving enough relief while using the spirometer.  You develop fever of 100.5 F (38.1 C) or higher. SEEK IMMEDIATE MEDICAL CARE IF:   You cough up bloody sputum that had not been present before.  You develop fever of 102 F (38.9 C) or greater.  You develop worsening pain at or near the incision site. MAKE SURE YOU:   Understand these instructions.  Will watch your condition.  Will get help right away if you are not doing well or get worse. Document Released: 02/16/2007 Document Revised: 12/29/2011 Document Reviewed: 04/19/2007 Exit[employer] Patient Information 2014 [employer], [place].   ________________________________________________________________________  WHAT IS A BLOOD TRANSFUSION? Blood Transfusion Information  A transfusion is the replacement of blood or some of its parts. Blood is made up of multiple cells which provide different functions.  Red blood cells carry oxygen and are used for blood loss replacement.  White blood cells fight against infection.  Platelets control bleeding.  Plasma helps clot blood.  Other blood products are available for specialized needs, such as hemophilia or other clotting disorders. BEFORE THE TRANSFUSION  Who gives blood for transfusions?   Healthy volunteers who are fully evaluated  to make sure their blood is safe. This is blood bank blood. Transfusion therapy is the safest it has ever been in the practice of medicine. Before blood is taken from a donor, a complete history is taken to make sure that person has no history of diseases nor engages in risky social behavior (examples are intravenous drug use or sexual activity with multiple partners). The donor's travel history is screened to minimize risk of transmitting infections, such as malaria. The donated blood is tested for signs of infectious diseases, such as HIV and hepatitis. The blood is then tested to be sure it is compatible with you in order to minimize the chance of a transfusion reaction. If you or a relative donates blood, this is often done in anticipation of surgery and is not appropriate for emergency situations. It takes many days to process the donated blood. RISKS AND COMPLICATIONS Although transfusion therapy is very safe  and saves many lives, the main dangers of transfusion include:   Getting an infectious disease.  Developing a transfusion reaction. This is an allergic reaction to something in the blood you were given. Every precaution is taken to prevent this. The decision to have a blood transfusion has been considered carefully by your caregiver before blood is given. Blood is not given unless the benefits outweigh the risks. AFTER THE TRANSFUSION  Right after receiving a blood transfusion, you will usually feel much better and more energetic. This is especially true if your red blood cells have gotten low (anemic). The transfusion raises the level of the red blood cells which carry oxygen, and this usually causes an energy increase.  The nurse administering the transfusion will monitor you carefully for complications. HOME CARE INSTRUCTIONS  No special instructions are needed after a transfusion. You may find your energy is better. Speak with your caregiver about any limitations on activity for  underlying diseases you may have. SEEK MEDICAL CARE IF:   Your condition is not improving after your transfusion.  You develop redness or irritation at the intravenous (IV) site. SEEK IMMEDIATE MEDICAL CARE IF:  Any of the following symptoms occur over the next 12 hours:  Shaking chills.  You have a temperature by mouth above 102 F (38.9 C), not controlled by medicine.  Chest, back, or muscle pain.  People around you feel you are not acting correctly or are confused.  Shortness of breath or difficulty breathing.  Dizziness and fainting.  You get a rash or develop hives.  You have a decrease in urine output.  Your urine turns a dark color or changes to pink, red, or brown. Any of the following symptoms occur over the next 10 days:  You have a temperature by mouth above 102 F (38.9 C), not controlled by medicine.  Shortness of breath.  Weakness after normal activity.  The white part of the eye turns yellow (jaundice).  You have a decrease in the amount of urine or are urinating less often.  Your urine turns a dark color or changes to pink, red, or brown. Document Released: 10/03/2000 Document Revised: 12/29/2011 Document Reviewed: 05/22/2008 [facility] Patient Information 2014 [place], [place].  _______________________________________________________________________

## 2014-06-22 NOTE — Progress Notes (Signed)
06/22/14 1543  OBSTRUCTIVE SLEEP APNEA  Have you ever been diagnosed with sleep apnea through a sleep study? No  Do you snore loudly (loud enough to be heard through closed doors)?  0  Do you often feel tired, fatigued, or sleepy during the daytime? 1  Has anyone observed you stop breathing during your sleep? 0  Do you have, or are you being treated for high blood pressure? 1  BMI more than 35 kg/m2? 0  Age over 78 years old? 1  Neck circumference greater than 40 cm/16 inches? 1  Gender: 1  Obstructive Sleep Apnea Score 5  Score 4 or greater  Results sent to PCP

## 2014-06-23 ENCOUNTER — Other Ambulatory Visit (HOSPITAL_COMMUNITY): Payer: Medicare Other

## 2014-06-30 ENCOUNTER — Encounter (HOSPITAL_COMMUNITY): Payer: Medicare Other | Admitting: Anesthesiology

## 2014-06-30 ENCOUNTER — Encounter (HOSPITAL_COMMUNITY)
Admission: RE | Disposition: A | Discharge status: Skilled Nursing Facility | Payer: Self-pay | Source: Ambulatory Visit | Attending: Orthopaedic Surgery

## 2014-06-30 ENCOUNTER — Inpatient Hospital Stay (HOSPITAL_COMMUNITY): Payer: Medicare Other

## 2014-06-30 ENCOUNTER — Encounter (HOSPITAL_COMMUNITY): Payer: Self-pay | Admitting: *Deleted

## 2014-06-30 ENCOUNTER — Inpatient Hospital Stay (HOSPITAL_COMMUNITY)
Admission: RE | Admit: 2014-06-30 | Discharge: 2014-07-05 | DRG: 470 | Disposition: A | Discharge status: Skilled Nursing Facility | Payer: Medicare Other | Source: Ambulatory Visit | Attending: Orthopaedic Surgery | Admitting: Orthopaedic Surgery

## 2014-06-30 ENCOUNTER — Inpatient Hospital Stay (HOSPITAL_COMMUNITY): Payer: Medicare Other | Admitting: Anesthesiology

## 2014-06-30 DIAGNOSIS — Z7982 Long term (current) use of aspirin: Secondary | ICD-10-CM | POA: Diagnosis not present

## 2014-06-30 DIAGNOSIS — I480 Paroxysmal atrial fibrillation: Secondary | ICD-10-CM

## 2014-06-30 DIAGNOSIS — R062 Wheezing: Secondary | ICD-10-CM | POA: Diagnosis not present

## 2014-06-30 DIAGNOSIS — I4891 Unspecified atrial fibrillation: Secondary | ICD-10-CM | POA: Diagnosis not present

## 2014-06-30 DIAGNOSIS — I440 Atrioventricular block, first degree: Secondary | ICD-10-CM | POA: Diagnosis present

## 2014-06-30 DIAGNOSIS — E876 Hypokalemia: Secondary | ICD-10-CM | POA: Diagnosis not present

## 2014-06-30 DIAGNOSIS — N183 Chronic kidney disease, stage 3 unspecified: Secondary | ICD-10-CM | POA: Diagnosis present

## 2014-06-30 DIAGNOSIS — Z954 Presence of other heart-valve replacement: Secondary | ICD-10-CM | POA: Diagnosis not present

## 2014-06-30 DIAGNOSIS — Z860100 Personal history of colon polyps, unspecified: Secondary | ICD-10-CM

## 2014-06-30 DIAGNOSIS — T40605A Adverse effect of unspecified narcotics, initial encounter: Secondary | ICD-10-CM | POA: Diagnosis not present

## 2014-06-30 DIAGNOSIS — M259 Joint disorder, unspecified: Secondary | ICD-10-CM | POA: Diagnosis not present

## 2014-06-30 DIAGNOSIS — I129 Hypertensive chronic kidney disease with stage 1 through stage 4 chronic kidney disease, or unspecified chronic kidney disease: Secondary | ICD-10-CM | POA: Diagnosis present

## 2014-06-30 DIAGNOSIS — M25569 Pain in unspecified knee: Secondary | ICD-10-CM | POA: Diagnosis present

## 2014-06-30 DIAGNOSIS — F19921 Other psychoactive substance use, unspecified with intoxication with delirium: Secondary | ICD-10-CM | POA: Diagnosis not present

## 2014-06-30 DIAGNOSIS — Z471 Aftercare following joint replacement surgery: Secondary | ICD-10-CM | POA: Diagnosis not present

## 2014-06-30 DIAGNOSIS — Z794 Long term (current) use of insulin: Secondary | ICD-10-CM | POA: Diagnosis not present

## 2014-06-30 DIAGNOSIS — K219 Gastro-esophageal reflux disease without esophagitis: Secondary | ICD-10-CM | POA: Diagnosis present

## 2014-06-30 DIAGNOSIS — I69998 Other sequelae following unspecified cerebrovascular disease: Secondary | ICD-10-CM | POA: Diagnosis not present

## 2014-06-30 DIAGNOSIS — Z8249 Family history of ischemic heart disease and other diseases of the circulatory system: Secondary | ICD-10-CM

## 2014-06-30 DIAGNOSIS — Z6830 Body mass index (BMI) 30.0-30.9, adult: Secondary | ICD-10-CM

## 2014-06-30 DIAGNOSIS — M1610 Unilateral primary osteoarthritis, unspecified hip: Principal | ICD-10-CM | POA: Diagnosis present

## 2014-06-30 DIAGNOSIS — Z803 Family history of malignant neoplasm of breast: Secondary | ICD-10-CM

## 2014-06-30 DIAGNOSIS — R410 Disorientation, unspecified: Secondary | ICD-10-CM

## 2014-06-30 DIAGNOSIS — Z951 Presence of aortocoronary bypass graft: Secondary | ICD-10-CM

## 2014-06-30 DIAGNOSIS — Z808 Family history of malignant neoplasm of other organs or systems: Secondary | ICD-10-CM | POA: Diagnosis not present

## 2014-06-30 DIAGNOSIS — I679 Cerebrovascular disease, unspecified: Secondary | ICD-10-CM | POA: Diagnosis present

## 2014-06-30 DIAGNOSIS — Z9861 Coronary angioplasty status: Secondary | ICD-10-CM

## 2014-06-30 DIAGNOSIS — R262 Difficulty in walking, not elsewhere classified: Secondary | ICD-10-CM | POA: Diagnosis not present

## 2014-06-30 DIAGNOSIS — M6281 Muscle weakness (generalized): Secondary | ICD-10-CM | POA: Diagnosis not present

## 2014-06-30 DIAGNOSIS — Z9181 History of falling: Secondary | ICD-10-CM | POA: Diagnosis not present

## 2014-06-30 DIAGNOSIS — T50905A Adverse effect of unspecified drugs, medicaments and biological substances, initial encounter: Secondary | ICD-10-CM

## 2014-06-30 DIAGNOSIS — E1149 Type 2 diabetes mellitus with other diabetic neurological complication: Secondary | ICD-10-CM | POA: Diagnosis present

## 2014-06-30 DIAGNOSIS — E7800 Pure hypercholesterolemia, unspecified: Secondary | ICD-10-CM | POA: Diagnosis not present

## 2014-06-30 DIAGNOSIS — R0602 Shortness of breath: Secondary | ICD-10-CM | POA: Diagnosis not present

## 2014-06-30 DIAGNOSIS — E119 Type 2 diabetes mellitus without complications: Secondary | ICD-10-CM | POA: Diagnosis not present

## 2014-06-30 DIAGNOSIS — I2510 Atherosclerotic heart disease of native coronary artery without angina pectoris: Secondary | ICD-10-CM | POA: Diagnosis present

## 2014-06-30 DIAGNOSIS — F411 Generalized anxiety disorder: Secondary | ICD-10-CM | POA: Diagnosis present

## 2014-06-30 DIAGNOSIS — D62 Acute posthemorrhagic anemia: Secondary | ICD-10-CM | POA: Diagnosis not present

## 2014-06-30 DIAGNOSIS — E785 Hyperlipidemia, unspecified: Secondary | ICD-10-CM | POA: Diagnosis present

## 2014-06-30 DIAGNOSIS — S79919A Unspecified injury of unspecified hip, initial encounter: Secondary | ICD-10-CM | POA: Diagnosis not present

## 2014-06-30 DIAGNOSIS — I959 Hypotension, unspecified: Secondary | ICD-10-CM | POA: Diagnosis not present

## 2014-06-30 DIAGNOSIS — Z9849 Cataract extraction status, unspecified eye: Secondary | ICD-10-CM

## 2014-06-30 DIAGNOSIS — R279 Unspecified lack of coordination: Secondary | ICD-10-CM | POA: Diagnosis not present

## 2014-06-30 DIAGNOSIS — I10 Essential (primary) hypertension: Secondary | ICD-10-CM | POA: Diagnosis not present

## 2014-06-30 DIAGNOSIS — Z87891 Personal history of nicotine dependence: Secondary | ICD-10-CM | POA: Diagnosis not present

## 2014-06-30 DIAGNOSIS — R9389 Abnormal findings on diagnostic imaging of other specified body structures: Secondary | ICD-10-CM | POA: Diagnosis not present

## 2014-06-30 DIAGNOSIS — Z96642 Presence of left artificial hip joint: Secondary | ICD-10-CM

## 2014-06-30 DIAGNOSIS — M169 Osteoarthritis of hip, unspecified: Secondary | ICD-10-CM | POA: Diagnosis not present

## 2014-06-30 DIAGNOSIS — Z96649 Presence of unspecified artificial hip joint: Secondary | ICD-10-CM | POA: Diagnosis not present

## 2014-06-30 DIAGNOSIS — M25559 Pain in unspecified hip: Secondary | ICD-10-CM | POA: Diagnosis not present

## 2014-06-30 DIAGNOSIS — I9789 Other postprocedural complications and disorders of the circulatory system, not elsewhere classified: Secondary | ICD-10-CM

## 2014-06-30 HISTORY — PX: TOTAL HIP ARTHROPLASTY: SHX124

## 2014-06-30 LAB — GLUCOSE, CAPILLARY
Glucose-Capillary: 113 mg/dL — ABNORMAL HIGH (ref 70–99)
Glucose-Capillary: 85 mg/dL (ref 70–99)
Glucose-Capillary: 146 mg/dL — ABNORMAL HIGH (ref 70–99)
Glucose-Capillary: 224 mg/dL — ABNORMAL HIGH (ref 70–99)

## 2014-06-30 LAB — CBC
HEMATOCRIT: 25.9 % — AB (ref 39.0–52.0)
Hemoglobin: 8.6 g/dL — ABNORMAL LOW (ref 13.0–17.0)
MCH: 31.3 pg (ref 26.0–34.0)
MCHC: 33.2 g/dL (ref 30.0–36.0)
MCV: 94.2 fL (ref 78.0–100.0)
Platelets: 188 10*3/uL (ref 150–400)
RBC: 2.75 MIL/uL — AB (ref 4.22–5.81)
RDW: 13.3 % (ref 11.5–15.5)
WBC: 13 10*3/uL — ABNORMAL HIGH (ref 4.0–10.5)

## 2014-06-30 LAB — ABO/RH: ABO/RH(D): A NEG

## 2014-06-30 LAB — PREPARE RBC (CROSSMATCH)

## 2014-06-30 SURGERY — ARTHROPLASTY, HIP, TOTAL, ANTERIOR APPROACH
Anesthesia: Spinal | Site: Hip | Laterality: Left

## 2014-06-30 MED ORDER — BUPIVACAINE HCL (PF) 0.5 % IJ SOLN
INTRAMUSCULAR | Status: DC | PRN
  Administered 2014-06-30: 3 mL

## 2014-06-30 MED ORDER — ONDANSETRON HCL 4 MG/2ML IJ SOLN
INTRAMUSCULAR | Status: DC | PRN
  Administered 2014-06-30: 4 mg via INTRAVENOUS

## 2014-06-30 MED ORDER — INSULIN ASPART 100 UNIT/ML ~~LOC~~ SOLN
0.0000 [IU] | Freq: Three times a day (TID) | SUBCUTANEOUS | Status: DC
  Administered 2014-07-01 (×2): 3 [IU] via SUBCUTANEOUS
  Administered 2014-07-01: 5 [IU] via SUBCUTANEOUS
  Administered 2014-07-02 – 2014-07-03 (×4): 2 [IU] via SUBCUTANEOUS
  Administered 2014-07-04 (×3): 3 [IU] via SUBCUTANEOUS

## 2014-06-30 MED ORDER — CETYLPYRIDINIUM CHLORIDE 0.05 % MT LIQD
7.0000 mL | Freq: Two times a day (BID) | OROMUCOSAL | Status: DC
  Administered 2014-07-01 – 2014-07-05 (×8): 7 mL via OROMUCOSAL

## 2014-06-30 MED ORDER — MENTHOL 3 MG MT LOZG
1.0000 | LOZENGE | OROMUCOSAL | Status: DC | PRN
  Filled 2014-06-30: qty 9

## 2014-06-30 MED ORDER — EPHEDRINE SULFATE 50 MG/ML IJ SOLN
INTRAMUSCULAR | Status: DC | PRN
  Administered 2014-06-30 (×4): 10 mg via INTRAVENOUS

## 2014-06-30 MED ORDER — SIMVASTATIN 40 MG PO TABS
40.0000 mg | ORAL_TABLET | Freq: Every day | ORAL | Status: DC
  Administered 2014-06-30 – 2014-07-01 (×2): 40 mg via ORAL
  Filled 2014-06-30 (×2): qty 1

## 2014-06-30 MED ORDER — CEFAZOLIN SODIUM-DEXTROSE 2-3 GM-% IV SOLR
INTRAVENOUS | Status: AC
  Filled 2014-06-30: qty 50

## 2014-06-30 MED ORDER — SODIUM CHLORIDE 0.9 % IR SOLN
Status: DC | PRN
  Administered 2014-06-30: 1000 mL

## 2014-06-30 MED ORDER — INSULIN GLARGINE 100 UNIT/ML ~~LOC~~ SOLN
28.0000 [IU] | Freq: Every day | SUBCUTANEOUS | Status: DC
  Administered 2014-06-30 – 2014-07-04 (×5): 28 [IU] via SUBCUTANEOUS
  Filled 2014-06-30 (×7): qty 0.28

## 2014-06-30 MED ORDER — ASPIRIN EC 325 MG PO TBEC
325.0000 mg | DELAYED_RELEASE_TABLET | Freq: Two times a day (BID) | ORAL | Status: DC
  Administered 2014-06-30 – 2014-07-05 (×8): 325 mg via ORAL
  Filled 2014-06-30 (×11): qty 1

## 2014-06-30 MED ORDER — FUROSEMIDE 10 MG/ML IJ SOLN
INTRAMUSCULAR | Status: AC
  Filled 2014-06-30: qty 2

## 2014-06-30 MED ORDER — PHENYLEPHRINE HCL 10 MG/ML IJ SOLN
10.0000 mg | INTRAVENOUS | Status: DC | PRN
  Administered 2014-06-30: 40 ug/min via INTRAVENOUS

## 2014-06-30 MED ORDER — CEFAZOLIN SODIUM 1-5 GM-% IV SOLN
1.0000 g | Freq: Four times a day (QID) | INTRAVENOUS | Status: AC
  Administered 2014-06-30 (×2): 1 g via INTRAVENOUS
  Filled 2014-06-30 (×4): qty 50

## 2014-06-30 MED ORDER — ONDANSETRON HCL 4 MG/2ML IJ SOLN: 4.0000 mg | Freq: Four times a day (QID) | INTRAMUSCULAR | Status: DC | PRN

## 2014-06-30 MED ORDER — GLIPIZIDE ER 5 MG PO TB24
5.0000 mg | ORAL_TABLET | Freq: Every day | ORAL | Status: DC
  Administered 2014-07-01: 5 mg via ORAL
  Filled 2014-06-30 (×3): qty 1

## 2014-06-30 MED ORDER — DIPHENHYDRAMINE HCL 12.5 MG/5ML PO ELIX: 12.5000 mg | ORAL_SOLUTION | ORAL | Status: DC | PRN

## 2014-06-30 MED ORDER — SODIUM CHLORIDE 0.9 % IV SOLN
INTRAVENOUS | Status: DC
  Administered 2014-06-30 – 2014-07-02 (×4): via INTRAVENOUS
  Administered 2014-07-02: 75 mL/h via INTRAVENOUS

## 2014-06-30 MED ORDER — MEPERIDINE HCL 50 MG/ML IJ SOLN: 6.2500 mg | INTRAMUSCULAR | Status: DC | PRN

## 2014-06-30 MED ORDER — METOPROLOL TARTRATE 1 MG/ML IV SOLN
2.5000 mg | INTRAVENOUS | Status: DC | PRN
  Administered 2014-06-30 (×2): 2.5 mg via INTRAVENOUS

## 2014-06-30 MED ORDER — METOPROLOL TARTRATE 25 MG PO TABS
50.0000 mg | ORAL_TABLET | Freq: Two times a day (BID) | ORAL | Status: DC
  Administered 2014-07-01: 50 mg via ORAL
  Filled 2014-06-30: qty 2

## 2014-06-30 MED ORDER — 0.9 % SODIUM CHLORIDE (POUR BTL) OPTIME
TOPICAL | Status: DC | PRN
  Administered 2014-06-30: 1000 mL

## 2014-06-30 MED ORDER — PROMETHAZINE HCL 25 MG/ML IJ SOLN: 6.2500 mg | INTRAMUSCULAR | Status: DC | PRN

## 2014-06-30 MED ORDER — ALUM & MAG HYDROXIDE-SIMETH 200-200-20 MG/5ML PO SUSP: 30.0000 mL | ORAL | Status: DC | PRN

## 2014-06-30 MED ORDER — ACETAMINOPHEN 650 MG RE SUPP: 650.0000 mg | Freq: Four times a day (QID) | RECTAL | Status: DC | PRN

## 2014-06-30 MED ORDER — METOCLOPRAMIDE HCL 5 MG/ML IJ SOLN: 5.0000 mg | Freq: Three times a day (TID) | INTRAMUSCULAR | Status: DC | PRN

## 2014-06-30 MED ORDER — NITROGLYCERIN 0.4 MG SL SUBL: 0.4000 mg | SUBLINGUAL_TABLET | SUBLINGUAL | Status: DC | PRN

## 2014-06-30 MED ORDER — ZOLPIDEM TARTRATE 5 MG PO TABS: 5.0000 mg | ORAL_TABLET | Freq: Every evening | ORAL | Status: DC | PRN

## 2014-06-30 MED ORDER — PROPOFOL 10 MG/ML IV BOLUS
INTRAVENOUS | Status: AC
  Filled 2014-06-30: qty 20

## 2014-06-30 MED ORDER — METHOCARBAMOL 1000 MG/10ML IJ SOLN
500.0000 mg | Freq: Four times a day (QID) | INTRAVENOUS | Status: DC | PRN
  Administered 2014-06-30: 500 mg via INTRAVENOUS
  Filled 2014-06-30: qty 5

## 2014-06-30 MED ORDER — ONDANSETRON HCL 4 MG PO TABS: 4.0000 mg | ORAL_TABLET | Freq: Four times a day (QID) | ORAL | Status: DC | PRN

## 2014-06-30 MED ORDER — POLYETHYLENE GLYCOL 3350 17 G PO PACK: 17.0000 g | PACK | Freq: Every day | ORAL | Status: DC | PRN

## 2014-06-30 MED ORDER — ONDANSETRON HCL 4 MG/2ML IJ SOLN
INTRAMUSCULAR | Status: AC
  Filled 2014-06-30: qty 2

## 2014-06-30 MED ORDER — PROPOFOL 10 MG/ML IV BOLUS
INTRAVENOUS | Status: DC | PRN
  Administered 2014-06-30: 20 mg via INTRAVENOUS
  Administered 2014-06-30: 10 mg via INTRAVENOUS

## 2014-06-30 MED ORDER — METOCLOPRAMIDE HCL 5 MG PO TABS: 5.0000 mg | ORAL_TABLET | Freq: Three times a day (TID) | ORAL | Status: DC | PRN

## 2014-06-30 MED ORDER — FUROSEMIDE 10 MG/ML IJ SOLN
10.0000 mg | Freq: Once | INTRAMUSCULAR | Status: AC
  Administered 2014-06-30: 10 mg via INTRAVENOUS

## 2014-06-30 MED ORDER — CALCIUM CHLORIDE 10 % IV SOLN
1.0000 g | Freq: Once | INTRAVENOUS | Status: AC
  Administered 2014-06-30: 1 g via INTRAVENOUS
  Filled 2014-06-30: qty 10

## 2014-06-30 MED ORDER — RAMIPRIL 5 MG PO CAPS
5.0000 mg | ORAL_CAPSULE | Freq: Every morning | ORAL | Status: DC
  Administered 2014-07-01: 5 mg via ORAL
  Filled 2014-06-30: qty 1

## 2014-06-30 MED ORDER — PROPOFOL INFUSION 10 MG/ML OPTIME
INTRAVENOUS | Status: DC | PRN
Start: 2014-06-30 — End: 2014-06-30
  Administered 2014-06-30: 75 ug/kg/min via INTRAVENOUS

## 2014-06-30 MED ORDER — HYDROMORPHONE HCL PF 1 MG/ML IJ SOLN
1.0000 mg | INTRAMUSCULAR | Status: DC | PRN
  Administered 2014-06-30 – 2014-07-01 (×2): 1 mg via INTRAVENOUS
  Filled 2014-06-30 (×2): qty 1

## 2014-06-30 MED ORDER — PHENOL 1.4 % MT LIQD: 1.0000 | OROMUCOSAL | Status: DC | PRN

## 2014-06-30 MED ORDER — LACTATED RINGERS IV SOLN
INTRAVENOUS | Status: DC | PRN
  Administered 2014-06-30 (×2): via INTRAVENOUS

## 2014-06-30 MED ORDER — HYDROMORPHONE HCL PF 1 MG/ML IJ SOLN
INTRAMUSCULAR | Status: AC
  Filled 2014-06-30: qty 1

## 2014-06-30 MED ORDER — BUPIVACAINE HCL (PF) 0.5 % IJ SOLN
INTRAMUSCULAR | Status: AC
  Filled 2014-06-30: qty 30

## 2014-06-30 MED ORDER — FAMOTIDINE 20 MG PO TABS
20.0000 mg | ORAL_TABLET | Freq: Every day | ORAL | Status: DC
  Administered 2014-06-30 – 2014-07-05 (×5): 20 mg via ORAL
  Filled 2014-06-30 (×5): qty 1

## 2014-06-30 MED ORDER — HYDROMORPHONE HCL PF 1 MG/ML IJ SOLN
0.2500 mg | INTRAMUSCULAR | Status: DC | PRN
  Administered 2014-06-30 (×3): 0.5 mg via INTRAVENOUS

## 2014-06-30 MED ORDER — OXYCODONE HCL 5 MG PO TABS: 5.0000 mg | ORAL_TABLET | Freq: Once | ORAL | Status: DC | PRN

## 2014-06-30 MED ORDER — OXYCODONE HCL 5 MG PO TABS
5.0000 mg | ORAL_TABLET | ORAL | Status: DC | PRN
  Administered 2014-07-01 (×2): 10 mg via ORAL
  Filled 2014-06-30: qty 2
  Filled 2014-06-30: qty 1
  Filled 2014-06-30: qty 2

## 2014-06-30 MED ORDER — OXYCODONE HCL 5 MG/5ML PO SOLN
5.0000 mg | Freq: Once | ORAL | Status: DC | PRN
  Filled 2014-06-30: qty 5

## 2014-06-30 MED ORDER — CEFAZOLIN SODIUM-DEXTROSE 2-3 GM-% IV SOLR
2.0000 g | INTRAVENOUS | Status: AC
  Administered 2014-06-30: 2 g via INTRAVENOUS

## 2014-06-30 MED ORDER — LACTATED RINGERS IV SOLN
INTRAVENOUS | Status: DC
  Administered 2014-06-30 (×3): via INTRAVENOUS

## 2014-06-30 MED ORDER — DOCUSATE SODIUM 100 MG PO CAPS
100.0000 mg | ORAL_CAPSULE | Freq: Two times a day (BID) | ORAL | Status: DC
  Administered 2014-06-30 – 2014-07-05 (×8): 100 mg via ORAL
  Filled 2014-06-30 (×4): qty 1

## 2014-06-30 MED ORDER — SODIUM CHLORIDE 0.9 % IV SOLN
Freq: Once | INTRAVENOUS | Status: AC
  Administered 2014-06-30: 15:00:00 via INTRAVENOUS

## 2014-06-30 MED ORDER — METOPROLOL TARTRATE 1 MG/ML IV SOLN
INTRAVENOUS | Status: AC
  Filled 2014-06-30: qty 5

## 2014-06-30 MED ORDER — ACETAMINOPHEN 325 MG PO TABS
650.0000 mg | ORAL_TABLET | Freq: Four times a day (QID) | ORAL | Status: DC | PRN
  Administered 2014-06-30: 650 mg via ORAL
  Filled 2014-06-30: qty 2

## 2014-06-30 MED ORDER — PROPOFOL 10 MG/ML IV BOLUS
INTRAVENOUS | Status: AC
Start: 2014-06-30 — End: 2014-06-30
  Filled 2014-06-30: qty 20

## 2014-06-30 MED ORDER — METHOCARBAMOL 500 MG PO TABS
500.0000 mg | ORAL_TABLET | Freq: Four times a day (QID) | ORAL | Status: DC | PRN
  Administered 2014-06-30: 500 mg via ORAL
  Filled 2014-06-30: qty 1

## 2014-06-30 SURGICAL SUPPLY — 40 items
BAG SPEC THK2 15X12 ZIP CLS (MISCELLANEOUS) ×1
BAG ZIPLOCK 12X15 (MISCELLANEOUS) ×2 IMPLANT
BLADE SAW SGTL 18X1.27X75 (BLADE) ×2 IMPLANT
BLADE SAW SGTL 18X1.27X75MM (BLADE) ×1
CAPT HIP PF MOP ×2 IMPLANT
CELLS DAT CNTRL 66122 CELL SVR (MISCELLANEOUS) ×1 IMPLANT
COVER PERINEAL POST (MISCELLANEOUS) ×3 IMPLANT
DRAPE C-ARM 42X120 X-RAY (DRAPES) ×3 IMPLANT
DRAPE STERI IOBAN 125X83 (DRAPES) ×3 IMPLANT
DRAPE U-SHAPE 47X51 STRL (DRAPES) ×9 IMPLANT
DRSG AQUACEL AG ADV 3.5X10 (GAUZE/BANDAGES/DRESSINGS) ×3 IMPLANT
DURAPREP 26ML APPLICATOR (WOUND CARE) ×3 IMPLANT
ELECT BLADE TIP CTD 4 INCH (ELECTRODE) ×3 IMPLANT
ELECT REM PT RETURN 9FT ADLT (ELECTROSURGICAL) ×3
ELECTRODE REM PT RTRN 9FT ADLT (ELECTROSURGICAL) ×1 IMPLANT
ELIMINATOR HOLE APEX DEPUY (Hips) IMPLANT
FACESHIELD WRAPAROUND (MASK) ×9 IMPLANT
FACESHIELD WRAPAROUND OR TEAM (MASK) ×4 IMPLANT
GAUZE XEROFORM 1X8 LF (GAUZE/BANDAGES/DRESSINGS) ×2 IMPLANT
GLOVE BIO SURGEON STRL SZ7.5 (GLOVE) ×3 IMPLANT
GLOVE BIOGEL PI IND STRL 8 (GLOVE) ×2 IMPLANT
GLOVE BIOGEL PI INDICATOR 8 (GLOVE) ×4
GLOVE ECLIPSE 8.0 STRL XLNG CF (GLOVE) ×3 IMPLANT
GOWN STRL REUS W/TWL XL LVL3 (GOWN DISPOSABLE) ×6 IMPLANT
HANDPIECE INTERPULSE COAX TIP (DISPOSABLE) ×3
KIT BASIN OR (CUSTOM PROCEDURE TRAY) ×3 IMPLANT
PACK TOTAL JOINT (CUSTOM PROCEDURE TRAY) ×3 IMPLANT
RETRACTOR WND ALEXIS 18 MED (MISCELLANEOUS) ×1 IMPLANT
RTRCTR WOUND ALEXIS 18CM MED (MISCELLANEOUS) ×3
SET HNDPC FAN SPRY TIP SCT (DISPOSABLE) ×1 IMPLANT
STAPLER VISISTAT 35W (STAPLE) ×2 IMPLANT
SUT ETHIBOND NAB CT1 #1 30IN (SUTURE) ×3 IMPLANT
SUT VIC AB 0 CT1 36 (SUTURE) ×3 IMPLANT
SUT VIC AB 1 CT1 36 (SUTURE) ×3 IMPLANT
SUT VIC AB 2-0 CT1 27 (SUTURE) ×6
SUT VIC AB 2-0 CT1 TAPERPNT 27 (SUTURE) ×2 IMPLANT
TOWEL OR 17X26 10 PK STRL BLUE (TOWEL DISPOSABLE) ×3 IMPLANT
TOWEL OR NON WOVEN STRL DISP B (DISPOSABLE) ×3 IMPLANT
TRAY FOLEY CATH 16FRSI W/METER (SET/KITS/TRAYS/PACK) ×2 IMPLANT
YANKAUER SUCT BULB TIP NO VENT (SUCTIONS) ×2 IMPLANT

## 2014-06-30 NOTE — Progress Notes (Signed)
Metoprolol 2.5 mg IVP REPEATED for heart rate increasing

## 2014-06-30 NOTE — Progress Notes (Signed)
Dr. Kalman Shan in to see patient.

## 2014-06-30 NOTE — Progress Notes (Signed)
Dr. Lissa Hoard in- made aware of patient's heart rates and blood pressures- patient to receive 500 cc LR IVB rapidly.

## 2014-06-30 NOTE — Progress Notes (Signed)
Dr. Lissa Hoard in- saw vital signs checked left hip and left hip dressing.

## 2014-06-30 NOTE — Op Note (Signed)
NAMEMARVIS, Eric [name] NO.:  [id number]  MEDICAL RECORD NO.:  [id number]  LOCATION:  [facility]                         FACILITY:  [facility]  PHYSICIAN:  Lind Guest. Ninfa Linden, M.D.DATE OF BIRTH:  07/02/1927  DATE OF PROCEDURE:  06/30/2014 DATE OF DISCHARGE:                              OPERATIVE REPORT   PREOPERATIVE DIAGNOSIS:  Severe end-stage arthritis and degenerative joint disease, left hip.  POSTOPERATIVE DIAGNOSIS:  Severe end-stage arthritis and degenerative joint disease, left hip.  PROCEDURE:  Left total hip arthroplasty, direct anterior approach.  IMPLANTS:  DePuy Sector Gription acetabular component size 54 with apex hole eliminator guide and 2 screws, size 36+ 4 neutral polyethylene liner, size 12 Corail femoral component with standard offset, size 36+ 1.5 metal hip ball.  SURGEON:  Jean Rosenthal, MD.  ASSISTANT:  Erskine Emery, PA-C.  ANESTHESIA:  Spinal.  ANTIBIOTICS:  A 2 g IV Ancef.  BLOOD LOSS:  450 mL.  COMPLICATIONS:  None.  INDICATIONS:  Mr. Patella is an 80 year old gentleman with debilitating left hip pain that radiates down to his left knee.  He is almost wheelchair bound now to the debilitating nature of his pain.  He has tried intra-articular steroid injections to the left hip with known severe osteoarthritis on x-rays.  It has gotten to where his mobility is so severely limited, and his quality of life is affected by his pain daily.  He is someone who has a quite complex medical history, basically involving a cardiac history and he needed clearance for the surgery.  I still feel it is high risk for him and I talked to him and his family in length about the risks of surgery given his age and health status and they still wish to proceed with surgery due to such poor quality of life and pain that they are hoping to have surgery with the goals of decreasing his pain, improvement in his stability and overall improvement of  his quality of life.  They understand the risks of acute blood loss anemia, nerve and vessel injury, fracture, infection, DVT, dislocation, and death.  PROCEDURE DESCRIPTION:  After informed consent was obtained, appropriate left hip was marked.  He was brought to the operating room and spinal anesthesia was obtained while he was on a stretcher.  He was then laid in a supine position.  A Foley catheter was placed and then traction boots were placed on both of his feet.  Next, he was placed supine on the hana fracture table with the perineal post in place and both legs in inline skeletal traction devices, but no traction applied.  His left operative hip was prepped and draped with DuraPrep and sterile drapes. Time-out was called to identify the correct patient, correct left hip. I then made an incision just inferior and posterior to the anterior- superior iliac spine and carried obliquely down the leg.  I dissected down the tensor fascia lata muscle, and the tensor fascia was then divided longitudinally.  He had significantly atrophied muscles and fibers making the interval somewhat difficult.  I was able to dissect down the hip joint.  We cauterized the lateral femoral circumflex vessels and then placed Cobra retractors around the  medial and lateral femoral neck.  We found a significant disease around the femoral head. We opened up the hip capsule and found a large joint effusion.  We made our femoral neck cut proximal to the lesser trochanter using oscillating saw and completed this with an osteotome.  We placed a corkscrew guide in the femoral head and removed the femoral head in its entirety and found it to be completely devoid of cartilage and collapsed.  We then cleaned the acetabular remnants of acetabular labrum and debris and began reaming under direct visualization from a 52 reamer in 2 mm increments all the way up to a size 54 with all reamers under direct visualization and  last 2 reamers under direct fluoroscopy, so we could also obtain our depth of reaming, our inclination, and anteversion. Once I was pleased with this, I placed the real DePuy Sector Gription acetabular component, the apex hole eliminator guide and 2 screws given his sclerotic bone.  This was again a size 54 acetabular component.  I then placed the real 36+ 4 neutral polyethylene liner for that size acetabular component.  Attention was then returned to the femur.  With the leg externally rotated, extended and adducted, we were able to place a Mueller retractor medially and a Hohmann retractor behind the greater trochanter.  We released remnants of the joint capsule and found to be pretty scarred down.  We then used a box cutting osteotome to enter the femoral canal and a rongeur to lateralize, and I broached from a size 8 broach using the Corail broaching system up to a size 12.  With a size 12, we trialed a standard neck and a 36+ 1.5 hip ball.  We reduced this in the acetabulum and it was stable and very tight.  His leg lengths showed he was still slightly short; however he has been contracted for so long and it was so tight and his knees has a slight flexion contracture and we could not go up in length.  We opted for pain relief and stability as our first 2 goals.  We dislocated the hip and removed the trial components.  We then placed the real Corail femoral component with standard offset size 12 and then the real 36+ 1.5 metal hip ball, reduced his back in the acetabulum, it was stable.  We copiously irrigated the soft tissues with normal saline solution using pulsatile lavage.  There were no limit remnants to joint the capsule close.  So, we closed the iliotibial band with interrupted #1 Vicryl suture followed by 0 Vicryl in the deep tissue, 2-0 Vicryl in subcutaneous tissue, and staples on the skin.  Xeroform and well-padded Aquacel dressing were applied.  He was taken off the hana  table and went to the recovery room in stable condition.  All final counts were correct and so far, he is stable.  Of note, Erskine Emery, PA-C assisted in the entire case and his assistance was crucial for patient positioning, retracting, and layered closure of the wound as well as facilitating all portions of the case.     Lind Guest. Ninfa Linden, M.D.     CYB/MEDQ  D:  06/30/2014  T:  06/30/2014  Job:  [id number]

## 2014-06-30 NOTE — Progress Notes (Signed)
Second unit PRBC started. T=97.8 BP108/61 HR=108 Resp 21

## 2014-06-30 NOTE — Transfer of Care (Signed)
Immediate Anesthesia Transfer of Care Note  Patient: Eric [name]  Procedure(s) Performed: Procedure(s): LEFT TOTAL HIP ARTHROPLASTY ANTERIOR APPROACH (Left)  Patient Location: PACU  Anesthesia Type:Spinal  Level of Consciousness: awake and confused  Airway & Oxygen Therapy: Patient Spontanous Breathing and Patient connected to face mask oxygen  Post-op Assessment: Report given to PACU RN  Post vital signs: Reviewed and stable  Complications: No apparent anesthesia complications

## 2014-06-30 NOTE — Progress Notes (Signed)
Dr. Lissa Hoard in - saw vital signs- 200 cc LR IVB given by Dr. Lissa Hoard.

## 2014-06-30 NOTE — H&P (Signed)
TOTAL HIP ADMISSION H&P  Patient is admitted for left total hip arthroplasty.  Subjective:  Chief Complaint: left hip pain  HPI: Eric [name], 80 y.o. male, has a history of pain and functional disability in the left hip(s) due to arthritis and patient has failed non-surgical conservative treatments for greater than 12 weeks to include NSAID's and/or analgesics, corticosteriod injections, flexibility and strengthening excercises, supervised PT with diminished ADL's post treatment, use of assistive devices and activity modification.  Onset of symptoms was gradual starting 2 years ago with gradually worsening course since that time.The patient noted no past surgery on the left hip(s).  Patient currently rates pain in the left hip at 10 out of 10 with activity. Patient has night pain, worsening of pain with activity and weight bearing, trendelenberg gait, pain that interfers with activities of daily living, pain with passive range of motion and crepitus. Patient has evidence of subchondral cysts, subchondral sclerosis, periarticular osteophytes and joint space narrowing by imaging studies. This condition presents safety issues increasing the risk of falls.  There is no current active infection.  Patient Active Problem List   Diagnosis Date Noted  . Arthritis of left hip 06/30/2014  . Left hip pain 11/03/2013  . Elbow laceration 11/03/2013  . Falls 11/03/2013  . Shingles 07/27/2012  . Medicare annual wellness visit, initial 03/08/2012  . Skin lesion 12/01/2011  . CKD (chronic kidney disease) stage 3, GFR 30-59 ml/min 09/24/2010  . AORTIC VALVE REPLACEMENT, HX OF 09/03/2010  . BENIGN PROSTATIC HYPERTROPHY, WITH URINARY OBSTRUCTION 01/10/2010  . CEREBROVASCULAR DISEASE 02/28/2009  . GERD 02/28/2009  . Type 2 diabetes, controlled, with neuropathy 12/17/2006  . NEUROPATHY, DIABETIC 12/17/2006  . HYPERCHOLESTEROLEMIA 12/17/2006  . HYPERTENSION, BENIGN SYSTEMIC 12/17/2006  . CORONARY,  ARTERIOSCLEROSIS 12/17/2006   Past Medical History  Diagnosis Date  . Cerebrovascular disease, unspecified   . Coronary atherosclerosis of unspecified type of vessel, native or graft     s/p CABG 2010  . Esophageal reflux   . Pure hypercholesterolemia   . Essential hypertension, benign   . Type II or unspecified type diabetes mellitus with neurological manifestations, not stated as uncontrolled   . Bilateral hydrocele 3/11    [facility]  . History of aortic stenosis     s/p valve replacment 2010  . Cervicalgia   . Foraminal stenosis of cervical region     bilateral-C4-C5, C5-C6  . Colon polyp     Past Surgical History  Procedure Laterality Date  . Cataract extraction  1990's    bilateral  . Cholecystectomy  1995  . Colonoscopy  1997    Mult. polyps- [facility]  . Sphincterectomy  09/20/03    for jaundice  . Ptca  12/99    with stent  . Coronary artery bypass graft  3/10    x3-using a left internal mammary artery to the left anterior descending coronary artery, saphenous vein graft to circumflex marginal branch, spahenous vein graft  to posterior descendingcoronary artery. Endoscopic saphenous vein harvest from bilateral thighs was done.   . Aortic valve replacement  12/2008    with pericardial tissue valve  . Carotid US  10/2009    B ICA stenosis, stable disease, rec rpt 2 years  . Hydrocele excision      bilateral ([facility])  . L leg trauma  1957    truck over left leg    No prescriptions prior to admission   No Known Allergies  History  Substance Use Topics  .  Smoking status: Former Smoker    Quit date: 10/20/1993  . Smokeless tobacco: Never Used  . Alcohol Use: Yes     Comment: occasional    Family History  Problem Relation Age of Onset  . Heart attack Father 14  . Breast cancer Mother 82  . Brain cancer Sister   . Prostate cancer Brother   . Diabetes Neg Hx   . Stroke Neg Hx      Review of Systems  Musculoskeletal: Positive for joint pain.   All other systems reviewed and are negative.   Objective:  Physical Exam  Constitutional: He is oriented to person, place, and time. He appears well-developed and well-nourished.  HENT:  Head: Normocephalic and atraumatic.  Eyes: EOM are normal. Pupils are equal, round, and reactive to light.  Neck: Normal range of motion. Neck supple.  Cardiovascular: Normal rate and regular rhythm.   Respiratory: Effort normal and breath sounds normal.  GI: Soft. Bowel sounds are normal.  Musculoskeletal:       Left hip: He exhibits decreased range of motion, decreased strength, bony tenderness and crepitus.  Neurological: He is alert and oriented to person, place, and time.  Skin: Skin is warm and dry.  Psychiatric: He has a normal mood and affect.    Vital signs in last 24 hours:    Labs:   Estimated body mass index is 30.26 kg/(m^2) as calculated from the following:   Height as of 02/14/14: 5\' 9"  (1.753 m).   Weight as of 02/14/14: 92.987 kg (205 lb).   Imaging Review Plain radiographs demonstrate severe degenerative joint disease of the left hip(s). The bone quality appears to be good for age and reported activity level.  Assessment/Plan:  End stage arthritis, left hip(s)  The patient history, physical examination, clinical judgement of the provider and imaging studies are consistent with end stage degenerative joint disease of the left hip(s) and total hip arthroplasty is deemed medically necessary. The treatment options including medical management, injection therapy, arthroscopy and arthroplasty were discussed at length. The risks and benefits of total hip arthroplasty were presented and reviewed. The risks due to aseptic loosening, infection, stiffness, dislocation/subluxation,  thromboembolic complications and other imponderables were discussed.  The patient acknowledged the explanation, agreed to proceed with the plan and consent was signed. Patient is being admitted for inpatient  treatment for surgery, pain control, PT, OT, prophylactic antibiotics, VTE prophylaxis, progressive ambulation and ADL's and discharge planning.The patient is planning to be discharged to skilled nursing facility

## 2014-06-30 NOTE — Progress Notes (Signed)
Patient to finish present I.V. BAG OF LR AT rapid rate- per Dr. Lissa Hoard- Dr. Lissa Hoard talked with Dr. Ninfa Linden

## 2014-06-30 NOTE — Anesthesia Postprocedure Evaluation (Signed)
Anesthesia Post Note  Patient: Eric [name]  Procedure(s) Performed: Procedure(s) (LRB): LEFT TOTAL HIP ARTHROPLASTY ANTERIOR APPROACH (Left)  Anesthesia type: Spinal  Patient location: PACU  Post pain: Pain level controlled  Post assessment: Post-op Vital signs reviewed  Last Vitals: BP 103/62  Pulse 95  Temp(Src) 36.3 C (Oral)  Resp 22  Ht 5\' 9"  (1.753 m)  Wt 205 lb (92.987 kg)  BMI 30.26 kg/m2  SpO2 100%  Post vital signs: Reviewed  Level of consciousness: sedated  PACU course: some mild hypotension in PACU responsive to volume. Pt AAO throughout.   Complications: No apparent anesthesia complications

## 2014-06-30 NOTE — Progress Notes (Signed)
Dr. Lissa Hoard made aware of patient's heart rates and blood  Pressures- orders given.

## 2014-06-30 NOTE — Anesthesia Procedure Notes (Addendum)
Spinal  Patient location during procedure: OR Start time: 06/30/2014 9:13 AM End time: 06/30/2014 9:18 AM Staffing CRNA/Resident: Anne Fu Performed by: resident/CRNA  Preanesthetic Checklist Completed: patient identified, site marked, surgical consent, pre-op evaluation, timeout performed, IV checked, risks and benefits discussed and monitors and equipment checked Spinal Block Patient position: sitting Prep: Betadine Patient monitoring: heart rate, continuous pulse ox and blood pressure Approach: right paramedian Location: L2-3 Injection technique: single-shot Needle Needle type: Spinocan  Needle gauge: 22 G Needle length: 9 cm Assessment Sensory level: T6 Additional Notes Expiration date of kit checked and confirmed. Patient tolerated procedure well, without complications. X 1 attempt with noted aspiration and loss of motor and sensory on exam post injection.

## 2014-06-30 NOTE — Progress Notes (Signed)
Dr. Lissa Hoard made aware of patient's heart rates being 101-108 - made aware of patient's blood pressures also-made aware of EBL in O.R. And pre-op Hgb. - to see patient.

## 2014-06-30 NOTE — Progress Notes (Signed)
Portable AP Pelvis and Lateral Left Hip X-rays done. 

## 2014-06-30 NOTE — Progress Notes (Signed)
Dr. Lissa Hoard in to see patient.

## 2014-06-30 NOTE — Progress Notes (Signed)
Dr. Lissa Hoard in- made aware of patient's blood pressures- heart rates- medications received in PACU

## 2014-06-30 NOTE — Progress Notes (Signed)
[facility] Progress Note Patient Name: Eric [name] DOB: 02-06-1927 MRN: [id number]   Date of Service  06/30/2014  HPI/Events of Note  S/p L THR Hemodynamically stable, mild sinus tachycardia Minimal oxygen requirements  eICU Interventions  No intervention required      Intervention Category Evaluation Type: New Patient Evaluation  Doree Fudge 06/30/2014, 6:19 PM

## 2014-06-30 NOTE — Progress Notes (Signed)
CBC drawn by lab. 

## 2014-06-30 NOTE — Progress Notes (Signed)
Dr. Kalman Shan made aware of HGB. AND Hct. RESULTS- Orders given

## 2014-06-30 NOTE — Progress Notes (Signed)
X-ray results noted 

## 2014-06-30 NOTE — Progress Notes (Signed)
Metoprolol 2.5 mg IVP given as ordered.

## 2014-06-30 NOTE — Progress Notes (Signed)
Patient to receive 400 cc LR IVB  In PACU.

## 2014-06-30 NOTE — Progress Notes (Signed)
Dr. Lissa Hoard in to see patient- made aware of patient's vital signs in PACU- medications and I.V. Fluid total in the O.R-also mentation - knows name-does not know where he is- does not know birthdate- does not know that he has had surgery- unreliable about spinal level- when examined- patient complains of left hip pain-trying to move legs- moves upper arms-Orders given by Dr. Lissa Hoard- to finish present I.V. Fluids- LR- 300cc  And then give another 500 cc LR I.V. Bolus in PACU.

## 2014-06-30 NOTE — Progress Notes (Signed)
Dr. Ninfa Linden in- examined patient's left hip- no new orders given.

## 2014-06-30 NOTE — Anesthesia Preprocedure Evaluation (Addendum)
Anesthesia Evaluation  Patient identified by MRN, date of birth, ID band Patient awake    Reviewed: Allergy & Precautions, H&P , NPO status , Patient's Chart, lab work & pertinent test results, reviewed documented beta blocker date and time   Airway Mallampati: II TM Distance: >3 FB Neck ROM: Full    Dental no notable dental hx.    Pulmonary former smoker,  breath sounds clear to auscultation  Pulmonary exam normal       Cardiovascular hypertension, Pt. on medications and Pt. on home beta blockers + CAD and + CABG + Valvular Problems/Murmurs Rhythm:Regular Rate:Normal  Echo 12/2013 - Left ventricle: There was mild focal basal hypertrophy of   the septum. Systolic function was normal. The estimated   ejection fraction was in the range of 55% to 60%. Wall   motion was normal; there were no regional wall motion   abnormalities. Doppler parameters are consistent with   abnormal left ventricular relaxation (grade 1 diastolic   dysfunction). - Aortic valve: A bioprosthesis was present. Mean gradient:   88mm Hg (S). Peak gradient: 33mm Hg (S). - Mitral valve: Moderately calcified annulus. Mild   regurgitation. - Left atrium: The atrium was mildly to moderately dilated. - Right ventricle: The cavity size was moderately dilated.   Systolic function was moderately reduced. - Right atrium: The atrium was moderately dilated. - Pulmonary arteries: Systolic pressure was mildly   increased. PA peak pressure: 76mm Hg (S).    Neuro/Psych  Neuromuscular disease negative psych ROS   GI/Hepatic Neg liver ROS, GERD-  ,  Endo/Other  negative endocrine ROSdiabetes, Type 2, Oral Hypoglycemic Agents, Insulin Dependent  Renal/GU CRFRenal disease     Musculoskeletal  (+) Arthritis -,   Abdominal   Peds  Hematology negative hematology ROS (+)   Anesthesia Other Findings   Reproductive/Obstetrics negative OB ROS                           Anesthesia Physical Anesthesia Plan  ASA: III  Anesthesia Plan:    Post-op Pain Management:    Induction:   Airway Management Planned:   Additional Equipment:   Intra-op Plan:   Post-operative Plan:   Informed Consent: I have reviewed the patients History and Physical, chart, labs and discussed the procedure including the risks, benefits and alternatives for the proposed anesthesia with the patient or authorized representative who has indicated his/her understanding and acceptance.   Dental advisory given  Plan Discussed with: CRNA  Anesthesia Plan Comments:         Anesthesia Quick Evaluation

## 2014-06-30 NOTE — Progress Notes (Signed)
Report given to Trude Mcburney, R.N.- for continued PACU care

## 2014-06-30 NOTE — Progress Notes (Signed)
1 st unit PRBCS HUNG

## 2014-06-30 NOTE — Brief Op Note (Signed)
06/30/2014  11:09 AM  PATIENT:  Donna Christen  80 y.o. male  PRE-OPERATIVE DIAGNOSIS:  Severe osteoarthritis left hip  POST-OPERATIVE DIAGNOSIS:  Severe osteoarthritis left hip  PROCEDURE:  Procedure(s): LEFT TOTAL HIP ARTHROPLASTY ANTERIOR APPROACH (Left)  SURGEON:  Surgeon(s) and Role:    * Mcarthur Rossetti, MD - Primary  PHYSICIAN ASSISTANT: Benita Stabile, PA-C  ANESTHESIA:   spinal  EBL:  Total I/O In: 1000 [I.V.:1000] Out: 700 [Urine:250; Blood:450]  BLOOD ADMINISTERED:none  DRAINS: none   LOCAL MEDICATIONS USED:  NONE  SPECIMEN:  No Specimen  DISPOSITION OF SPECIMEN:  N/A  COUNTS:  YES  TOURNIQUET:  * No tourniquets in log *  DICTATION: .Other Dictation: Dictation Number [id number]  PLAN OF CARE: Admit to inpatient   PATIENT DISPOSITION:  PACU - hemodynamically stable.   Delay start of Pharmacological VTE agent (>24hrs) due to surgical blood loss or risk of bleeding: no

## 2014-07-01 ENCOUNTER — Inpatient Hospital Stay (HOSPITAL_COMMUNITY): Payer: Medicare Other

## 2014-07-01 LAB — URINALYSIS, ROUTINE W REFLEX MICROSCOPIC
Bilirubin Urine: NEGATIVE
GLUCOSE, UA: 100 mg/dL — AB
Ketones, ur: NEGATIVE mg/dL
Nitrite: NEGATIVE
Protein, ur: 30 mg/dL — AB
Specific Gravity, Urine: 1.021 (ref 1.005–1.030)
Urobilinogen, UA: 0.2 mg/dL (ref 0.0–1.0)
pH: 5.5 (ref 5.0–8.0)

## 2014-07-01 LAB — URINE MICROSCOPIC-ADD ON

## 2014-07-01 LAB — CBC
HCT: 28.4 % — ABNORMAL LOW (ref 39.0–52.0)
Hemoglobin: 9.6 g/dL — ABNORMAL LOW (ref 13.0–17.0)
MCH: 31.1 pg (ref 26.0–34.0)
MCHC: 33.8 g/dL (ref 30.0–36.0)
MCV: 91.9 fL (ref 78.0–100.0)
Platelets: 125 10*3/uL — ABNORMAL LOW (ref 150–400)
RBC: 3.09 MIL/uL — AB (ref 4.22–5.81)
RDW: 14.9 % (ref 11.5–15.5)
WBC: 8.1 10*3/uL (ref 4.0–10.5)

## 2014-07-01 LAB — BASIC METABOLIC PANEL
Anion gap: 13 (ref 5–15)
BUN: 24 mg/dL — ABNORMAL HIGH (ref 6–23)
CO2: 22 meq/L (ref 19–32)
CREATININE: 2.13 mg/dL — AB (ref 0.50–1.35)
Calcium: 8.4 mg/dL (ref 8.4–10.5)
Chloride: 100 mEq/L (ref 96–112)
GFR calc Af Amer: 31 mL/min — ABNORMAL LOW (ref >90)
GFR calc non Af Amer: 27 mL/min — ABNORMAL LOW (ref >90)
Glucose, Bld: 247 mg/dL — ABNORMAL HIGH (ref 70–99)
Potassium: 5.2 mEq/L (ref 3.7–5.3)
Sodium: 135 mEq/L — ABNORMAL LOW (ref 137–147)

## 2014-07-01 LAB — GLUCOSE, CAPILLARY
GLUCOSE-CAPILLARY: 238 mg/dL — AB (ref 70–99)
GLUCOSE-CAPILLARY: 188 mg/dL — AB (ref 70–99)
GLUCOSE-CAPILLARY: 154 mg/dL — AB (ref 70–99)

## 2014-07-01 LAB — PREPARE RBC (CROSSMATCH)

## 2014-07-01 MED ORDER — SODIUM CHLORIDE 0.9 % IV SOLN
Freq: Once | INTRAVENOUS | Status: DC
Start: 2014-07-01 — End: 2014-07-04

## 2014-07-01 MED ORDER — METOPROLOL TARTRATE 25 MG PO TABS
25.0000 mg | ORAL_TABLET | Freq: Two times a day (BID) | ORAL | Status: DC
  Administered 2014-07-01: 25 mg via ORAL
  Filled 2014-07-01: qty 1

## 2014-07-01 NOTE — Progress Notes (Signed)
RN calling elink  Patient c/o dysuria  Plan Send off UA and urine culture  Dr. Brand Males, M.D., [facility].C.P Pulmonary and Critical Care Medicine Staff Physician [facility] Pager: [phone], If no answer or between  15:00h - 7:00h: call [phone]  07/01/2014 2:38 AM

## 2014-07-01 NOTE — Progress Notes (Signed)
Subjective: 1 Day Post-Op Procedure(s) (LRB): LEFT TOTAL HIP ARTHROPLASTY ANTERIOR APPROACH (Left) Patient reports pain as moderate.  In ICU due to hypotension post-op.  Did receive 2 unit PRBCs after surgery due to acute blood loss anemia and hypotension.  H/H improved this am.  Creatinine is up.  He does have chronic renal insufficiency.    Objective: Vital signs in last 24 hours: Temp:  [97.3 F (36.3 C)-98.9 F (37.2 C)] 98.9 F (37.2 C) (09/12 0400) Pulse Rate:  [81-118] 118 (09/12 0800) Resp:  [13-25] 24 (09/12 0800) BP: (68-144)/(23-96) 104/62 mmHg (09/12 0800) SpO2:  [94 %-100 %] 96 % (09/12 0600)  Intake/Output from previous day: 09/11 0701 - 09/12 0700 In: 8158.8 [P.O.:240; I.V.:7043.8; Blood:720; IV Piggyback:155] Out: 1325 [Urine:875; Blood:450] Intake/Output this shift: Total I/O In: 75 [I.V.:75] Out: 75 [Urine:75]   Recent Labs  06/30/14 1431 07/01/14 0343  HGB 8.6* 9.6*    Recent Labs  06/30/14 1431 07/01/14 0343  WBC 13.0* 8.1  RBC 2.75* 3.09*  HCT 25.9* 28.4*  PLT 188 125*    Recent Labs  07/01/14 0343  NA 135*  K 5.2  CL 100  CO2 22  BUN 24*  CREATININE 2.13*  GLUCOSE 247*  CALCIUM 8.4   No results found for this basename: LABPT, INR,  in the last 72 hours  Intact pulses distally Dorsiflexion/Plantar flexion intact Incision: scant drainage His left hip/leg is painful to motion and his leg is shortened and rotated.  This is likely how he lays naturally due to the severity of hi long-lasting pain and knee flexion contracture   Assessment/Plan: 1 Day Post-Op Procedure(s) (LRB): LEFT TOTAL HIP ARTHROPLASTY ANTERIOR APPROACH (Left) Will obtain left hip x-ray to assess replacement. I did place a steroid injection in his left knee at the bedside to try to help with his knee pain. His daughter can bring his knee sleeve to use on his left knee. Will monitor H/H closely and creatinine, which is likely up acutely due to his  hypotension.  Azizah Lisle Y 07/01/2014, 8:10 AM

## 2014-07-01 NOTE — Progress Notes (Signed)
Noted that while patient is eating food, he starts to talk each time as he swallows.  This RN encouraged the patient to please finish swallowing before talking.  Noted that pt coughs directly after swallowing food and talking.   Pt OOB to chair w two assists and is tolerating chair position.  Pain medicine administered as needed for post-op pain.   Pt. Also encouraged to C/D/B and use IS post-operatively.  Noted patient is reluctant to follow suggestions of Nursing staff regarding post-op care and the prevention of post op complications, though does comply after making a joke or initially reminding staff of his age and what he is willing to do and not do.

## 2014-07-01 NOTE — Plan of Care (Signed)
Problem: Phase I Progression Outcomes Goal: OOB as tolerated unless otherwise ordered Outcome: Progressing OOB to chair w 2 assists.

## 2014-07-01 NOTE — Progress Notes (Signed)
Occupational Therapy Evaluation Patient Details Name: Eric [name] MRN: [id number] DOB: [birth year] Today's Date: 07/01/2014    History of Present Illness s/p L THA   Clinical Impression   Patient presents to OT with decreased ADL independence and safety. Will benefit from skilled OT to maximize functional independence and facilitate safe discharge plan.    Follow Up Recommendations  SNF;Supervision/Assistance - 24 hour    Equipment Recommendations  3 in 1 bedside comode    Recommendations for Other Services PT consult     Precautions / Restrictions Precautions Precautions: Fall Restrictions Other Position/Activity Restrictions: WBAT      Mobility Bed Mobility                  Transfers                      Balance                                            ADL Overall ADL's : Needs assistance/impaired Eating/Feeding: Total assistance;Sitting (pt reports due to lines/leads and pain)       Upper Body Bathing: Maximal assistance   Lower Body Bathing: Total assistance   Upper Body Dressing : Maximal assistance   Lower Body Dressing: Total assistance               Functional mobility during ADLs:  (deferred- pt up in chair having recently worked with PT) General ADL Comments: Pt's son and daughter present and state that plan is for pt to go to rehab prior to d/c home with son's A. Son had been helping him full-time, pt needed A with ADLs and mobility prior to surgery. Has RW at home.      Vision                     Perception     Praxis      Pertinent Vitals/Pain Pain Assessment: 0-10 Pain Score: 10-Worst pain ever Pain Location: R hip Pain Descriptors / Indicators: Constant;Aching;Discomfort Pain Intervention(s): Patient requesting pain meds-RN notified;Limited activity within patient's tolerance     Hand Dominance Right   Extremity/Trunk Assessment Upper Extremity Assessment Upper Extremity  Assessment: Generalized weakness   Lower Extremity Assessment Lower Extremity Assessment: Defer to PT evaluation       Communication Communication Communication: HOH   Cognition Arousal/Alertness: Awake/alert Behavior During Therapy: WFL for tasks assessed/performed Overall Cognitive Status: Within Functional Limits for tasks assessed                     General Comments       Exercises       Shoulder Instructions      Home Living Family/patient expects to be discharged to:: Skilled nursing facility Living Arrangements: Children                                      Prior Functioning/Environment Level of Independence: Independent with assistive device(s)        Comments: Son assisted with bathing/dressing/toileting    OT Diagnosis: Acute pain;Generalized weakness   OT Problem List:     OT Treatment/Interventions: Self-care/ADL training;DME and/or AE instruction;Therapeutic activities;Patient/family education    OT Goals(Current goals can be found in the care plan  section) Acute Rehab OT Goals Patient Stated Goal: none stated OT Goal Formulation: With patient Time For Goal Achievement: 07/15/14 Potential to Achieve Goals: Good  OT Frequency: Min 2X/week   Barriers to D/C:            Co-evaluation              End of Session Nurse Communication: Patient requests pain meds  Activity Tolerance: Patient limited by pain Patient left: in chair;with call bell/phone within reach;with family/visitor present   Time: 1203-1217 OT Time Calculation (min): 14 min Charges:  OT General Charges $OT Visit: 1 Procedure OT Evaluation $Initial OT Evaluation Tier I: 1 Procedure OT Treatments $Self Care/Home Management : 8-22 mins G-Codes:    Eric [name] A 07/22/2014, 12:23 PM

## 2014-07-01 NOTE — Progress Notes (Signed)
Clinical Social Work Department BRIEF PSYCHOSOCIAL ASSESSMENT 07/01/2014  Patient:  Eric [name], Eric [name]     Account Number:  [id number]     [place] date:  06/30/2014  Clinical Social Worker:  Lacie Scotts  Date/Time:  07/01/2014 01:02 PM  Referred by:  Physician  Date Referred:  07/01/2014 Referred for  SNF Placement   Other Referral:   Interview type:  Patient Other interview type:    PSYCHOSOCIAL DATA Living Status:  ALONE Admitted from facility:   Level of care:   Primary support name:  Eric [name] Primary support relationship to patient:  CHILD, ADULT Degree of support available:   supportive    CURRENT CONCERNS Current Concerns  Post-Acute Placement   Other Concerns:    SOCIAL WORK ASSESSMENT / PLAN Pt is an 80 yr old gentleman living at home prior to hospitalization. CSW met with pt / spoke to daughter to assist with d/c planning. This is a planned admission. Pt has made prior arrangements to have ST Rehab at [facility] following hospital d/c. CSW has contacted SNF and d/c plans have been confirmed. CSW will continue to follow to assist with d/c planning to SNF.   Assessment/plan status:  Psychosocial Support/Ongoing Assessment of Needs Other assessment/ plan:   Information/referral to community resources:   Insurance coverage for SNF and ambulance transport reviewed.    PATIENT'S/FAMILY'S RESPONSE TO PLAN OF CARE: Pt presently in ICU. CSW contacted daughter, at pt's request, to assist with d/c planning. Family is hoping pt will be transferred to a regular floor soon. They are looking forward to pt progressing and being able to work with therapy.    Werner Lean LCSW [phone]
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
## 2014-07-01 NOTE — Progress Notes (Signed)
Clinical Social Work Department CLINICAL SOCIAL WORK PLACEMENT NOTE 07/01/2014  Patient:  Eric [name], Eric [name]  Account Number:  [id number] Admit date:  06/30/2014  Clinical Social Worker:  Werner Lean, LCSW  Date/time:  07/01/2014 02:24 PM  Clinical Social Work is seeking post-discharge placement for this patient at the following level of care:   SKILLED NURSING   (*CSW will update this form in Epic as items are completed)     Patient/family provided with [place] Department of Clinical Social Work's list of facilities offering this level of care within the geographic area requested by the patient (or if unable, by the patient's family).  07/01/2014  Patient/family informed of their freedom to choose among providers that offer the needed level of care, that participate in Medicare, Medicaid or managed care program needed by the patient, have an available bed and are willing to accept the patient.    Patient/family informed of [facility]' ownership interest in [facility], as well as of the fact that they are under no obligation to receive care at this facility.  PASARR submitted to EDS on 06/30/2014 PASARR number received on 06/30/2014  FL2 transmitted to all facilities in geographic area requested by pt/family on  07/01/2014 FL2 transmitted to all facilities within larger geographic area on   Patient informed that his/her managed care company has contracts with or will negotiate with  certain facilities, including the following:     Patient/family informed of bed offers received:  07/01/2014 Patient chooses bed at [place] Physician recommends and patient chooses bed at    Patient to be transferred to  on   Patient to be transferred to facility by  Patient and family notified of transfer on  Name of family member notified:    The following physician request were entered in Epic:   Additional Comments:  Werner Lean LCSW [phone]

## 2014-07-02 ENCOUNTER — Inpatient Hospital Stay (HOSPITAL_COMMUNITY): Payer: Medicare Other

## 2014-07-02 DIAGNOSIS — I2510 Atherosclerotic heart disease of native coronary artery without angina pectoris: Secondary | ICD-10-CM

## 2014-07-02 DIAGNOSIS — I480 Paroxysmal atrial fibrillation: Secondary | ICD-10-CM | POA: Diagnosis present

## 2014-07-02 DIAGNOSIS — N183 Chronic kidney disease, stage 3 unspecified: Secondary | ICD-10-CM

## 2014-07-02 DIAGNOSIS — T50904A Poisoning by unspecified drugs, medicaments and biological substances, undetermined, initial encounter: Secondary | ICD-10-CM

## 2014-07-02 DIAGNOSIS — T50905A Adverse effect of unspecified drugs, medicaments and biological substances, initial encounter: Secondary | ICD-10-CM

## 2014-07-02 DIAGNOSIS — F19921 Other psychoactive substance use, unspecified with intoxication with delirium: Secondary | ICD-10-CM

## 2014-07-02 DIAGNOSIS — R410 Disorientation, unspecified: Secondary | ICD-10-CM

## 2014-07-02 DIAGNOSIS — Z954 Presence of other heart-valve replacement: Secondary | ICD-10-CM

## 2014-07-02 LAB — BASIC METABOLIC PANEL
ANION GAP: 16 — AB (ref 5–15)
BUN: 27 mg/dL — ABNORMAL HIGH (ref 6–23)
CHLORIDE: 98 meq/L (ref 96–112)
CO2: 19 mEq/L (ref 19–32)
Calcium: 8.5 mg/dL (ref 8.4–10.5)
Creatinine, Ser: 1.87 mg/dL — ABNORMAL HIGH (ref 0.50–1.35)
GFR, EST AFRICAN AMERICAN: 36 mL/min — AB (ref >90)
GFR, EST NON AFRICAN AMERICAN: 31 mL/min — AB (ref >90)
Glucose, Bld: 194 mg/dL — ABNORMAL HIGH (ref 70–99)
POTASSIUM: 4.9 meq/L (ref 3.7–5.3)
SODIUM: 133 meq/L — AB (ref 137–147)

## 2014-07-02 LAB — GLUCOSE, CAPILLARY
GLUCOSE-CAPILLARY: 134 mg/dL — AB (ref 70–99)
Glucose-Capillary: 111 mg/dL — ABNORMAL HIGH (ref 70–99)
Glucose-Capillary: 120 mg/dL — ABNORMAL HIGH (ref 70–99)
Glucose-Capillary: 130 mg/dL — ABNORMAL HIGH (ref 70–99)
Glucose-Capillary: 220 mg/dL — ABNORMAL HIGH (ref 70–99)
Glucose-Capillary: 64 mg/dL — ABNORMAL LOW (ref 70–99)

## 2014-07-02 LAB — CBC
HCT: 24.8 % — ABNORMAL LOW (ref 39.0–52.0)
Hemoglobin: 8.6 g/dL — ABNORMAL LOW (ref 13.0–17.0)
MCH: 31 pg (ref 26.0–34.0)
MCHC: 34.7 g/dL (ref 30.0–36.0)
MCV: 89.5 fL (ref 78.0–100.0)
Platelets: 129 10*3/uL — ABNORMAL LOW (ref 150–400)
RBC: 2.77 MIL/uL — ABNORMAL LOW (ref 4.22–5.81)
RDW: 14.3 % (ref 11.5–15.5)
WBC: 12.4 10*3/uL — AB (ref 4.0–10.5)

## 2014-07-02 LAB — URINE CULTURE
COLONY COUNT: NO GROWTH
CULTURE: NO GROWTH

## 2014-07-02 MED ORDER — LORAZEPAM 2 MG/ML IJ SOLN
INTRAMUSCULAR | Status: AC
  Administered 2014-07-02: 0.5 mg via INTRAVENOUS
  Filled 2014-07-02: qty 1

## 2014-07-02 MED ORDER — THIAMINE HCL 100 MG/ML IJ SOLN
100.0000 mg | Freq: Every day | INTRAMUSCULAR | Status: DC
  Administered 2014-07-02: 100 mg via INTRAVENOUS
  Filled 2014-07-02: qty 2
  Filled 2014-07-02 (×2): qty 1
  Filled 2014-07-02: qty 2

## 2014-07-02 MED ORDER — LORAZEPAM 2 MG/ML IJ SOLN
INTRAMUSCULAR | Status: AC
  Administered 2014-07-02: 0.5 mg
  Filled 2014-07-02: qty 1

## 2014-07-02 MED ORDER — LORAZEPAM 2 MG/ML IJ SOLN: 2.0000 mg | Freq: Once | INTRAMUSCULAR | Status: AC

## 2014-07-02 MED ORDER — AMIODARONE HCL IN DEXTROSE 360-4.14 MG/200ML-% IV SOLN
30.0000 mg/h | INTRAVENOUS | Status: DC
  Administered 2014-07-03: 30 mg/h via INTRAVENOUS
  Filled 2014-07-02 (×2): qty 200

## 2014-07-02 MED ORDER — LORAZEPAM 2 MG/ML IJ SOLN
0.5000 mg | Freq: Once | INTRAMUSCULAR | Status: AC
  Administered 2014-07-02: 0.5 mg via INTRAVENOUS

## 2014-07-02 MED ORDER — LORAZEPAM 2 MG/ML IJ SOLN
2.0000 mg | INTRAMUSCULAR | Status: DC | PRN
  Filled 2014-07-02: qty 1

## 2014-07-02 MED ORDER — ADULT MULTIVITAMIN W/MINERALS CH
1.0000 | ORAL_TABLET | Freq: Every day | ORAL | Status: DC
  Administered 2014-07-03 – 2014-07-05 (×3): 1 via ORAL
  Filled 2014-07-02 (×3): qty 1

## 2014-07-02 MED ORDER — METOPROLOL TARTRATE 1 MG/ML IV SOLN
5.0000 mg | Freq: Four times a day (QID) | INTRAVENOUS | Status: DC
  Administered 2014-07-02 – 2014-07-03 (×4): 5 mg via INTRAVENOUS
  Filled 2014-07-02 (×4): qty 5

## 2014-07-02 MED ORDER — DEXTROSE 5 % IV SOLN
1.0000 g | INTRAVENOUS | Status: DC
  Administered 2014-07-02: 1 g via INTRAVENOUS
  Filled 2014-07-02: qty 10

## 2014-07-02 MED ORDER — DEXTROSE 50 % IV SOLN: 25.0000 mL | Freq: Once | INTRAVENOUS | Status: AC | PRN

## 2014-07-02 MED ORDER — VITAMIN B-1 100 MG PO TABS
100.0000 mg | ORAL_TABLET | Freq: Every day | ORAL | Status: DC
  Administered 2014-07-03 – 2014-07-05 (×3): 100 mg via ORAL
  Filled 2014-07-02 (×3): qty 1

## 2014-07-02 MED ORDER — LORAZEPAM 2 MG/ML IJ SOLN
INTRAMUSCULAR | Status: AC
  Filled 2014-07-02: qty 1

## 2014-07-02 MED ORDER — FOLIC ACID 1 MG PO TABS
1.0000 mg | ORAL_TABLET | Freq: Every day | ORAL | Status: DC
  Administered 2014-07-03 – 2014-07-05 (×3): 1 mg via ORAL
  Filled 2014-07-02 (×3): qty 1

## 2014-07-02 MED ORDER — METOPROLOL TARTRATE 25 MG PO TABS: 50.0000 mg | ORAL_TABLET | Freq: Two times a day (BID) | ORAL | Status: DC

## 2014-07-02 MED ORDER — LORAZEPAM 1 MG PO TABS: 1.0000 mg | ORAL_TABLET | Freq: Four times a day (QID) | ORAL | Status: DC | PRN

## 2014-07-02 MED ORDER — MORPHINE SULFATE 2 MG/ML IJ SOLN: 1.0000 mg | INTRAMUSCULAR | Status: DC | PRN

## 2014-07-02 MED ORDER — AMIODARONE LOAD VIA INFUSION
150.0000 mg | Freq: Once | INTRAVENOUS | Status: AC
Start: 2014-07-02 — End: 2014-07-02
  Administered 2014-07-02: 150 mg via INTRAVENOUS
  Filled 2014-07-02: qty 83.34

## 2014-07-02 MED ORDER — ATORVASTATIN CALCIUM 20 MG PO TABS
20.0000 mg | ORAL_TABLET | Freq: Every day | ORAL | Status: DC
  Administered 2014-07-03 – 2014-07-04 (×2): 20 mg via ORAL
  Filled 2014-07-02 (×4): qty 1

## 2014-07-02 MED ORDER — LORAZEPAM 2 MG/ML IJ SOLN
2.0000 mg | Freq: Once | INTRAMUSCULAR | Status: AC
Start: 2014-07-02 — End: 2014-07-02
  Administered 2014-07-02: 2 mg via INTRAMUSCULAR

## 2014-07-02 MED ORDER — LORAZEPAM 2 MG/ML IJ SOLN
INTRAMUSCULAR | Status: AC
  Administered 2014-07-02: 2 mg
  Filled 2014-07-02: qty 1

## 2014-07-02 MED ORDER — DEXTROSE 50 % IV SOLN
INTRAVENOUS | Status: AC
  Administered 2014-07-02: 25 mL
  Filled 2014-07-02: qty 50

## 2014-07-02 MED ORDER — LORAZEPAM 2 MG/ML IJ SOLN
1.0000 mg | Freq: Four times a day (QID) | INTRAMUSCULAR | Status: DC | PRN
  Administered 2014-07-02: 1 mg via INTRAVENOUS
  Filled 2014-07-02: qty 1

## 2014-07-02 MED ORDER — AMIODARONE HCL IN DEXTROSE 360-4.14 MG/200ML-% IV SOLN
60.0000 mg/h | INTRAVENOUS | Status: AC
  Administered 2014-07-02 (×2): 60 mg/h via INTRAVENOUS
  Filled 2014-07-02: qty 200
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
## 2014-07-02 NOTE — Progress Notes (Signed)
07/02/14 1655  Paged Dr Ninfa Linden [phone], to get order to change Lopressor PO to IV. Patient HR 130's BP 145/88. Waiting for call back.

## 2014-07-02 NOTE — Consult Note (Signed)
Admit date: 06/30/2014 Referring Physician  Dr. Darrick Meigs Primary Physician [name], MD Primary Cardiologist  Dr. Stanford Breed Reason for Consultation  atrial fibrillation with rapid ventricular response  HPI: 80 year-old male with postop atrial fibrillation following left hip arthroplasty. He has a history of bypass surgery in 2010, aortic valve replacement in 2010, hypertension, hyperlipidemia, diabetes, carotid artery disease.  Early postop he was transferred to ICU secondary to hypotension and received 2 units of packed red blood cells. In the early morning of 07/02/14 he began to develop acute delirium which was noted as presumed alcohol withdrawal however his daughter states that he may have one drink a month.  On 07/02/14 at approximately 9 AM, his heart rate increased to the 130-140 range, atrial fibrillation noted on telemetry. Amiodarone IV was started. Shortly thereafter, telemetry reveals sinus rhythm with first degree AV block. He now appears to have sinus rhythm with frequent PACs.  Currently he is fairly unresponsive. His daughter is at his bedside and states that he will answer some questions however his eyes are shut and he occasionally will grow with pain. He also is snoring.  He was seen by Dr. Stanford Breed preoperatively. He has been quite stable from a cardiac perspective over the last several years.  PMH:   Past Medical History  Diagnosis Date  . Cerebrovascular disease, unspecified   . Coronary atherosclerosis of unspecified type of vessel, native or graft     s/p CABG 2010  . Esophageal reflux   . Pure hypercholesterolemia   . Essential hypertension, benign   . Type II or unspecified type diabetes mellitus with neurological manifestations, not stated as uncontrolled   . Bilateral hydrocele 3/11    [facility]  . History of aortic stenosis     s/p valve replacment 2010  . Cervicalgia   . Foraminal stenosis of cervical region     bilateral-C4-C5, C5-C6  . Colon  polyp     PSH:   Past Surgical History  Procedure Laterality Date  . Cataract extraction  1990's    bilateral  . Cholecystectomy  1995  . Colonoscopy  1997    Mult. polyps- [facility]  . Sphincterectomy  09/20/03    for jaundice  . Ptca  12/99    with stent  . Coronary artery bypass graft  3/10    x3-using a left internal mammary artery to the left anterior descending coronary artery, saphenous vein graft to circumflex marginal branch, spahenous vein graft  to posterior descendingcoronary artery. Endoscopic saphenous vein harvest from bilateral thighs was done.   . Aortic valve replacement  12/2008    with pericardial tissue valve  . Carotid US  10/2009    B ICA stenosis, stable disease, rec rpt 2 years  . Hydrocele excision      bilateral ([facility])  . L leg trauma  1957    truck over left leg   Allergies:  Review of patient's allergies indicates no known allergies. Prior to Admit Meds:   Prior to Admission medications   Medication Sig Start Date End Date Taking? Authorizing Provider  aspirin 81 MG tablet Take 81 mg by mouth daily.     Yes Historical Provider, MD  famotidine (PEPCID) 20 MG tablet Take 20 mg by mouth daily.   Yes Historical Provider, MD  glipiZIDE (GLUCOTROL XL) 5 MG 24 hr tablet Take 5 mg by mouth daily with breakfast.   Yes Historical Provider, MD  insulin glargine (LANTUS) 100 UNIT/ML injection Inject 28  Units into the skin at bedtime.   Yes Historical Provider, MD  metoprolol (LOPRESSOR) 50 MG tablet Take 50 mg by mouth 2 (two) times daily.   Yes Historical Provider, MD  nitroGLYCERIN (NITROSTAT) 0.4 MG SL tablet Place 0.4 mg under the tongue every 5 (five) minutes as needed.     Yes Historical Provider, MD  ramipril (ALTACE) 5 MG capsule Take 5 mg by mouth every morning.   Yes Historical Provider, MD  simvastatin (ZOCOR) 40 MG tablet Take 40 mg by mouth daily.   Yes Historical Provider, MD   Fam HX:    Family History  Problem Relation Age of Onset    . Heart attack Father 60  . Breast cancer Mother 28  . Brain cancer Sister   . Prostate cancer Brother   . Diabetes Neg Hx   . Stroke Neg Hx    Social HX:    History   Social History  . Marital Status: Widowed    Spouse Name: N/A    Number of Children: 3  . Years of Education: N/A   Occupational History  . retired-truck driver    Social History Main Topics  . Smoking status: Former Smoker    Quit date: 10/20/1993  . Smokeless tobacco: Never Used  . Alcohol Use: Yes     Comment: occasional  . Drug Use: No  . Sexual Activity: Not on file   Other Topics Concern  . Not on file   Social History Narrative   Caffeine: 2-3 cups coffee   Lives with son   Retired Educational psychologist, lives with 2 sons, 1 daughter in [place]   Activity: no regular exercise 2/2 hip pain and SOB.     ROS:  All 11 ROS were addressed and are negative except what is stated in the HPI   Physical Exam: Blood pressure 146/66, pulse 125, temperature 98.4 F (36.9 C), temperature source Oral, resp. rate 30, height 5\' 9"  (1.753 m), weight 233 lb 14.5 oz (106.1 kg), SpO2 100.00%.   General: Well developed, well nourished, eyes closed, delirious Head:  No xanthomas.   Normal cephalic and atramatic  Lungs:   Clear bilaterally to auscultation and percussion. Normal respiratory effort. No wheezes, no rales. Positive snoring Heart:  Tachycardic, irregular Pulses are 2+ & equal. 1/6 systolic right upper sternal border murmur, rubs, gallops.  No carotid bruit. Difficult to assess JVD.  No abdominal bruits. Distant overall heart sounds Abdomen: Bowel sounds are positive, abdomen soft and non-tender without masses. No hepatosplenomegaly. Protuberant abdomen Msk:  Unable to assess. Extremities:  No significant edema.  DP +1 Neuro: Fairly obtunded, non-focal, MAE x 4 GU: Deferred Rectal: Deferred Psych:  Appears obtunded      Labs: Lab Results  Component Value Date   WBC 12.4* 07/02/2014   HGB 8.6*  07/02/2014   HCT 24.8* 07/02/2014   MCV 89.5 07/02/2014   PLT 129* 07/02/2014     Recent Labs Lab 07/02/14 0407  NA 133*  K 4.9  CL 98  CO2 19  BUN 27*  CREATININE 1.87*  CALCIUM 8.5  GLUCOSE 194*    Lab Results  Component Value Date   CHOL 113 11/03/2013   HDL 38.70* 11/03/2013   LDLCALC 46 11/03/2013   TRIG 141.0 11/03/2013      Radiology:  Dg Chest Port 1 View  07/02/2014   CLINICAL DATA:  Shortness of breath.  EXAM: PORTABLE CHEST - 1 VIEW  COMPARISON:  Chest x-ray  06/22/2014.  FINDINGS: Lung volumes are low. No consolidative airspace disease. No pleural effusions. Crowding of the pulmonary vasculature, accentuated by low lung volumes, without frank pulmonary edema. Heart size appears borderline enlarged, likely accentuated by patient's slight rotation to the right, which also distorts upper mediastinal contours. Atherosclerosis in the thoracic aorta. Status post median sternotomy for CABG.  IMPRESSION: 1. Low lung volumes without radiographic evidence of acute cardiopulmonary disease. 2. Atherosclerosis.   Electronically Signed   By: Vinnie Langton M.D.   On: 07/02/2014 13:37   Dg Hip Portable 1 View Left  07/01/2014   CLINICAL DATA:  Severe osteoarthritis the left hip. Left total hip replacement.  EXAM: PORTABLE LEFT HIP - 1 VIEW  COMPARISON:  Radiographs dated 06/30/2014 and 11/03/2013  FINDINGS: The acetabular and femoral components of the left hip prosthesis appear in excellent position in the AP projection. No fractures. Surgical staples are present in the skin.  IMPRESSION: Satisfactory appearance of the left hip prosthesis in the AP projection.   Electronically Signed   By: Rozetta Nunnery M.D.   On: 07/01/2014 08:37   Personally viewed.  EKG:   07/02/14 at 1352-sinus rhythm with first degree AV block, PAC, left axis deviation Personally viewed. Previously telemetry demonstrated atrial fibrillation with rapid ventricular response.  Echocardiogram: 01/11/13-ejection fraction 55%,  moderately dilated right ventricle, normal functioning bioprosthetic aortic valve  ASSESSMENT/PLAN:    80 year old male with postop atrial fibrillation, paroxysmal currently in sinus rhythm with first degree AV block, delirious, coronary artery disease status post bypass surgery and aortic valve replacement, bioprosthetic, chronic kidney disease stage III, diabetes, hypertension, hyperlipidemia.  -Continue with IV amiodarone to help avoid further instances of postop atrial fibrillation -Transition to by mouth amiodarone when able to take oral medications.  -I would suggest amiodarone 200 mg twice a day by mouth when able. -Plan would be to discontinue amiodarone in 2-3 weeks if he continues to demonstrate sinus rhythm. -No anticoagulation for now. If atrial fibrillation returns becomes more persistent, we would have to consider anticoagulation. -Resume metoprolol by mouth when able. Need to be careful with first degree AV block. -Blood pressure currently controlled -Delirium per primary team. I agree that he does not drink enough alcohol to cause alcohol withdrawal.  Candee Furbish, MD  07/02/2014  1:47 PM

## 2014-07-02 NOTE — Progress Notes (Signed)
PT Cancellation Note  Patient Details Name: Eric [name] MRN: [id number] DOB: [birth year]   Cancelled Treatment:    Reason Eval/Treat Not Completed: Medical issues which prohibited therapy; Pt with new onset a-fib, was combative last night and now somnolent due to having                                           had Ativan; will check on pt in am;    [facility] 07/02/2014, 1:04 PM

## 2014-07-02 NOTE — Progress Notes (Signed)
Subjective: 2 Days Post-Op Procedure(s) (LRB): LEFT TOTAL HIP ARTHROPLASTY ANTERIOR APPROACH (Left) Patient seems agitated and uncomfortable. Babbling and unoriented. New unset a-fib.  Objective: Vital signs in last 24 hours: Temp:  [98.3 F (36.8 C)-99.2 F (37.3 C)] 98.3 F (36.8 C) (09/13 0800) Pulse Rate:  [91-141] 125 (09/13 0600) Resp:  [17-31] 30 (09/13 0600) BP: (91-155)/(45-95) 146/66 mmHg (09/13 0600) SpO2:  [90 %-100 %] 100 % (09/13 0600) Weight:  [106.1 kg (233 lb 14.5 oz)] 106.1 kg (233 lb 14.5 oz) (09/13 0400)  Intake/Output from previous day: 09/12 0701 - 09/13 0700 In: 1725 [I.V.:1725] Out: 1385 [Urine:1385] Intake/Output this shift:     Recent Labs  06/30/14 1431 07/01/14 0343 07/02/14 0407  HGB 8.6* 9.6* 8.6*    Recent Labs  07/01/14 0343 07/02/14 0407  WBC 8.1 12.4*  RBC 3.09* 2.77*  HCT 28.4* 24.8*  PLT 125* 129*    Recent Labs  07/01/14 0343 07/02/14 0407  NA 135* 133*  K 5.2 4.9  CL 100 98  CO2 22 19  BUN 24* 27*  CREATININE 2.13* 1.87*  GLUCOSE 247* 194*  CALCIUM 8.4 8.5   No results found for this basename: LABPT, INR,  in the last 72 hours  Moderate Serosanguineous drainage from left hip wound Slight distention of left thigh Left calf supple   Assessment/Plan: 2 Days Post-Op Procedure(s) (LRB): LEFT TOTAL HIP ARTHROPLASTY ANTERIOR APPROACH (Left) Cardiology consult for new onset A-fib Medicine consult for mental status change and agitation.Reported occasional ETOH use.  Left hip dressing changed  [place], [place] 07/02/2014, 10:37 AM

## 2014-07-02 NOTE — Progress Notes (Signed)
Patient has intermittent stridor.  Lungs are clear to ausculation.   Rass score -2.  Notified Dr. Darrick Meigs and order for chest xray received.

## 2014-07-02 NOTE — Progress Notes (Signed)
PHARMACIST - PHYSICIAN COMMUNICATION  CONCERNING:  Drug-Drug Interactions with Amiodarone  65 yoM s/p left THA on 9/11 and sent to ICU for post-op hypotension, now also with new onset A-Fib and presumed agitation from alcohol withdrawal.  Amiodarone infusion started and pharmacy consulted to review medications for any potential drug interactions.    Interaction between simvastatin and amiodarone may result in increased exposure to simvastatin and an increased risk of myopathy or rhabdomyolysis. Substitution made to atorvastatin per P/T policy.   Interaction between ondansetron and amiodarone may result in increased risk of QT interval. However ondanstron is currently only ordered as PRN, therefore would not recommend any change.     Ralene Bathe, PharmD, BCPS 07/02/2014, 11:27 AM  Pager: [phone]

## 2014-07-02 NOTE — Progress Notes (Signed)
Patient increasingly agitated, restless, disoriented, and combative throughout shift.  MD Ninfa Linden paged and orders received at 02:00.  Agitation and combativeness continued despite intervention and constant reassurance.  MD Ninfa Linden paged and more orders received at 03:00.  Despite intervention, agitation and combativeness continued, eLink notified and orders received.  Patient settling down after doses of ativan.  Sitter at bedside.  Will continue to monitor.

## 2014-07-02 NOTE — Progress Notes (Signed)
[facility] Progress Note Patient Name: Eric [name] DOB: [birth year] MRN: [id number]   Date of Service  07/02/2014  HPI/Events of Note  Patient s/p left THA with agitation from presumed alcohol withdrawal.    eICU Interventions  Ativan 2 mg IM x 1 until IV re-established ciwa protocol     Intervention Category Minor Interventions: Agitation / anxiety - evaluation and management  Mauri Brooklyn, P 07/02/2014, 3:32 AM

## 2014-07-03 ENCOUNTER — Encounter (HOSPITAL_COMMUNITY): Payer: Self-pay | Admitting: Orthopaedic Surgery

## 2014-07-03 LAB — CBC
HCT: 24.3 % — ABNORMAL LOW (ref 39.0–52.0)
HEMOGLOBIN: 8.4 g/dL — AB (ref 13.0–17.0)
MCH: 31.3 pg (ref 26.0–34.0)
MCHC: 34.6 g/dL (ref 30.0–36.0)
MCV: 90.7 fL (ref 78.0–100.0)
Platelets: 113 10*3/uL — ABNORMAL LOW (ref 150–400)
RBC: 2.68 MIL/uL — ABNORMAL LOW (ref 4.22–5.81)
RDW: 14.3 % (ref 11.5–15.5)
WBC: 9.6 10*3/uL (ref 4.0–10.5)

## 2014-07-03 LAB — GLUCOSE, CAPILLARY
GLUCOSE-CAPILLARY: 121 mg/dL — AB (ref 70–99)
GLUCOSE-CAPILLARY: 162 mg/dL — AB (ref 70–99)
Glucose-Capillary: 127 mg/dL — ABNORMAL HIGH (ref 70–99)
Glucose-Capillary: 134 mg/dL — ABNORMAL HIGH (ref 70–99)

## 2014-07-03 MED ORDER — HYDROCODONE-ACETAMINOPHEN 5-325 MG PO TABS
1.0000 | ORAL_TABLET | Freq: Four times a day (QID) | ORAL | Status: DC | PRN
  Administered 2014-07-03 – 2014-07-05 (×6): 1 via ORAL
  Filled 2014-07-03 (×6): qty 1

## 2014-07-03 MED ORDER — METOPROLOL TARTRATE 25 MG PO TABS
25.0000 mg | ORAL_TABLET | Freq: Two times a day (BID) | ORAL | Status: DC
  Administered 2014-07-03 – 2014-07-05 (×5): 25 mg via ORAL
  Filled 2014-07-03 (×6): qty 1

## 2014-07-03 MED ORDER — HYDROMORPHONE HCL PF 1 MG/ML IJ SOLN: 1.0000 mg | INTRAMUSCULAR | Status: DC | PRN

## 2014-07-03 MED ORDER — FUROSEMIDE 10 MG/ML IJ SOLN
20.0000 mg | Freq: Once | INTRAMUSCULAR | Status: AC
  Administered 2014-07-03: 20 mg via INTRAVENOUS
  Filled 2014-07-03: qty 2

## 2014-07-03 MED ORDER — IPRATROPIUM-ALBUTEROL 0.5-2.5 (3) MG/3ML IN SOLN: 3.0000 mL | RESPIRATORY_TRACT | Status: DC | PRN

## 2014-07-03 MED ORDER — AMIODARONE HCL 200 MG PO TABS
200.0000 mg | ORAL_TABLET | Freq: Two times a day (BID) | ORAL | Status: DC
  Administered 2014-07-03 (×2): 200 mg via ORAL
  Filled 2014-07-03 (×4): qty 1

## 2014-07-03 MED ORDER — IPRATROPIUM-ALBUTEROL 0.5-2.5 (3) MG/3ML IN SOLN
3.0000 mL | RESPIRATORY_TRACT | Status: DC
  Administered 2014-07-03: 3 mL via RESPIRATORY_TRACT
  Filled 2014-07-03: qty 3

## 2014-07-03 NOTE — Progress Notes (Signed)
CSW assisting pt with SNF placement. Plans have been made for d/c to [facility] when stable. CSW will continue following assist with d/c planning to SNF.  Werner Lean LCSW [phone]

## 2014-07-03 NOTE — Progress Notes (Signed)
CSW continues to follow to assist with d/c planning to [facility] when stable. Clinical updates have been sent to SNF.  Werner Lean LCSW [phone]

## 2014-07-03 NOTE — Progress Notes (Signed)
Subjective: 3 Days Post-Op Procedure(s) (LRB): LEFT TOTAL HIP ARTHROPLASTY ANTERIOR APPROACH (Left) Patient reports pain as moderate.  Oriented x 3 this AM.  Objective: Vital signs in last 24 hours: Temp:  [98.1 F (36.7 C)-99.7 F (37.6 C)] 98.1 F (36.7 C) (09/14 0400) Pulse Rate:  [80-110] 98 (09/14 0800) Resp:  [19-32] 22 (09/14 0800) BP: (93-172)/(30-82) 172/82 mmHg (09/14 0800) SpO2:  [94 %-100 %] 100 % (09/14 0800) Weight:  [108.8 kg (239 lb 13.8 oz)] 108.8 kg (239 lb 13.8 oz) (09/14 0054)  Intake/Output from previous day: 09/13 0701 - 09/14 0700 In: 2128.8 [P.O.:120; I.V.:1958.8; IV Piggyback:50] Out: 3125 [JGOTL:5726] Intake/Output this shift:     Recent Labs  06/30/14 1431 07/01/14 0343 07/02/14 0407 07/03/14 0341  HGB 8.6* 9.6* 8.6* 8.4*    Recent Labs  07/02/14 0407 07/03/14 0341  WBC 12.4* 9.6  RBC 2.77* 2.68*  HCT 24.8* 24.3*  PLT 129* 113*    Recent Labs  07/01/14 0343 07/02/14 0407  NA 135* 133*  K 5.2 4.9  CL 100 98  CO2 22 19  BUN 24* 27*  CREATININE 2.13* 1.87*  GLUCOSE 247* 194*  CALCIUM 8.4 8.5   No results found for this basename: LABPT, INR,  in the last 72 hours  Intact pulses distally Dorsiflexion/Plantar flexion intact Incision: moderate drainage Compartment soft  Assessment/Plan: 3 Days Post-Op Procedure(s) (LRB): LEFT TOTAL HIP ARTHROPLASTY ANTERIOR APPROACH (Left) Post-op Delirium resolved this AM Post-op A-fib on Amiodarone. Sinus rhythm Left hip with moderate drainage will have nursing staff change dressing today     Erskine Emery 07/03/2014, 8:22 AM

## 2014-07-03 NOTE — Progress Notes (Signed)
Physical Therapy Treatment Patient Details Name: Eric [name] MRN: [id number] DOB: 11/01/1926 Today's Date: 07/03/2014    History of Present Illness s/p L DA THA; pt had L knee injected at bedside as well for improved pain control; PMHx: LLE trauma many years ago,  DM, CKD, post op A fib and delerium, ABLA, hypotension.    PT Comments    Patients L leg is very edematous and painful with attempts to  Perform ROM to hip, much difficulty in extending L knee.  Patient did stand and pivot with much difficulty to stand. HR 110-130. RN aware.  Follow Up Recommendations  SNF     Equipment Recommendations  None recommended by PT    Recommendations for Other Services       Precautions / Restrictions Precautions Precautions: Fall Precaution Comments: monitor VS Restrictions Weight Bearing Restrictions: No Other Position/Activity Restrictions: WBAT    Mobility  Bed Mobility Overal bed mobility: Needs Assistance;+2 for physical assistance Bed Mobility: Supine to Sit     Supine to sit: Max assist;+2 for physical assistance     General bed mobility comments: extra time to work through pain.  max assist for trunk  upright with HOB max height.  Transfers Overall transfer level: Needs assistance Equipment used: Rolling walker (2 wheeled) Transfers: Sit to/from Stand Sit to Stand: +2 physical assistance;Max assist Stand pivot transfers: +2 physical assistance;Min assist       General transfer comment: cues for hand placmement and safety, lifting assist from Bed  to standing, support at L knee during stance, LLE is externally rotated and knee is flexed.  Ambulation/Gait                 Stairs            Wheelchair Mobility    Modified Rankin (Stroke Patients Only)       Balance   Sitting-balance support: Bilateral upper extremity supported Sitting balance-Leahy Scale: Poor   Postural control: Posterior lean Standing balance support: Bilateral upper  extremity supported Standing balance-Leahy Scale: Poor                      Cognition Arousal/Alertness: Awake/alert Behavior During Therapy: WFL for tasks assessed/performed Overall Cognitive Status: Within Functional Limits for tasks assessed                      Exercises Total Joint Exercises Heel Slides: AAROM;Left;5 reps;Supine    General Comments        Pertinent Vitals/Pain Pain Assessment: Faces Faces Pain Scale: Hurts whole lot Pain Location: L hip Pain Descriptors / Indicators: Aching;Constant;Sharp Pain Intervention(s): Premedicated before session;Repositioned    Home Living                      Prior Function            PT Goals (current goals can now be found in the care plan section) Progress towards PT goals: Progressing toward goals    Frequency  7X/week    PT Plan Current plan remains appropriate    Co-evaluation             End of Session Equipment Utilized During Treatment: Gait belt Activity Tolerance: Patient limited by pain;Treatment limited secondary to medical complications (Comment) (wheezing, SOB on RA but sats > 95%) Patient left: in chair;with call bell/phone within reach     Time: 0927-1001 PT Time Calculation (min): 34 min  Charges:  $  Therapeutic Activity: 23-37 mins                    G Codes:      Claretha Cooper 07/03/2014, 10:22 AM Tresa Endo PT [phone]

## 2014-07-03 NOTE — Progress Notes (Addendum)
[facility] PROGRESS NOTE  Eric [name] YQI:[id number] DOB: 26-Jun-1927 DOA: 06/30/2014 PCP: [name], MD  Assessment/Plan: 1. Delirium- Resolved, likely due to oxycodone and dilaudid. Will start him on po vicodin q 6 hrs prn for pain.  Continue with ativan prn for anxiety. 2. Bilateral wheezing- ? Fluid overload vs reactive airways, will give one dose of IV lasix 20 mg and Duoneb nebulizer Q 4 hr prn.  Code Status: Full code Family Communication: Called and discussed with daughter on phone on 9/13     HPI/Subjective: Patient is alert, oriented x 3 this morning, denies any complaints.  Objective: Filed Vitals:   07/03/14 1400  BP: 134/92  Pulse: 123  Temp:   Resp: 22    Intake/Output Summary (Last 24 hours) at 07/03/14 1636 Last data filed at 07/03/14 1400  Gross per 24 hour  Intake 2347.3 ml  Output   4625 ml  Net -2277.7 ml   Filed Weights   06/30/14 0800 07/02/14 0400 07/03/14 0054  Weight: 92.987 kg (205 lb) 106.1 kg (233 lb 14.5 oz) 108.8 kg (239 lb 13.8 oz)    Exam:  Physical Exam: Head: Normocephalic, atraumatic.  Eyes: No signs of jaundice, EOMI       Lungs: Normal respiratory effort.Bilateral wheezing Heart: Regular RR. S1 and S2 normal  Abdomen: BS normoactive. Soft, Nondistended, non-tender.  Extremities: No pretibial edema, no erythema   Data Reviewed: Basic Metabolic Panel:  Recent Labs Lab 07/01/14 0343 07/02/14 0407  NA 135* 133*  K 5.2 4.9  CL 100 98  CO2 22 19  GLUCOSE 247* 194*  BUN 24* 27*  CREATININE 2.13* 1.87*  CALCIUM 8.4 8.5   Liver Function Tests: No results found for this basename: AST, ALT, ALKPHOS, BILITOT, PROT, ALBUMIN,  in the last 168 hours No results found for this basename: LIPASE, AMYLASE,  in the last 168 hours No results found for this basename: AMMONIA,  in the last 168 hours CBC:  Recent Labs Lab 06/30/14 1431 07/01/14 0343 07/02/14 0407 07/03/14 0341  WBC 13.0* 8.1 12.4* 9.6  HGB 8.6*  9.6* 8.6* 8.4*  HCT 25.9* 28.4* 24.8* 24.3*  MCV 94.2 91.9 89.5 90.7  PLT 188 125* 129* 113*   Cardiac Enzymes: No results found for this basename: CKTOTAL, CKMB, CKMBINDEX, TROPONINI,  in the last 168 hours BNP (last 3 results) No results found for this basename: PROBNP,  in the last 8760 hours CBG:  Recent Labs Lab 07/02/14 1337 07/02/14 1617 07/02/14 2134 07/03/14 0746 07/03/14 1151  GLUCAP 134* 111* 120* 127* 121*    Recent Results (from the past 240 hour(s))  URINE CULTURE     Status: None   Collection Time    07/01/14  3:32 AM      Result Value Ref Range Status   Specimen Description URINE, CATHETERIZED   Final   Special Requests NONE   Final   Culture  Setup Time     Final   Value: 07/01/2014 16:21     Performed at [place]     Final   Value: NO GROWTH     Performed at [facility]   Culture     Final   Value: NO GROWTH     Performed at [facility]   Report Status 07/02/2014 FINAL   Final  CULTURE, BLOOD (ROUTINE X 2)     Status: None   Collection Time    07/02/14 12:20 PM  Result Value Ref Range Status   Specimen Description BLOOD RIGHT ARM  10 ML IN [facility] BOTTLE   Final   Special Requests NONE   Final   Culture  Setup Time     Final   Value: 07/02/2014 18:27     Performed at [facility]   Culture     Final   Value:        BLOOD CULTURE RECEIVED NO GROWTH TO DATE CULTURE WILL BE HELD FOR 5 DAYS BEFORE ISSUING A FINAL NEGATIVE REPORT     Note: Culture results may be compromised due to an excessive volume of blood received in culture bottles.     Performed at [facility]   Report Status PENDING   Incomplete  CULTURE, BLOOD (ROUTINE X 2)     Status: None   Collection Time    07/02/14 12:33 PM      Result Value Ref Range Status   Specimen Description BLOOD RIGHT ARM   Final   Special Requests     Final   Value: BOTTLES DRAWN AEROBIC AND ANAEROBIC 10 CC BLUE BOTTLE, 8 CC RED BOTTLE   Culture   Setup Time     Final   Value: 07/02/2014 18:27     Performed at [facility]   Culture     Final   Value:        BLOOD CULTURE RECEIVED NO GROWTH TO DATE CULTURE WILL BE HELD FOR 5 DAYS BEFORE ISSUING A FINAL NEGATIVE REPORT     Note: Culture results may be compromised due to an excessive volume of blood received in culture bottles.     Performed at [facility]   Report Status PENDING   Incomplete     Studies: Dg Chest Port 1 View  07/02/2014   CLINICAL DATA:  Stridor  EXAM: PORTABLE CHEST - 1 VIEW  COMPARISON:  07/02/2014  FINDINGS: Previous median sternotomy and CABG procedure. There is mild cardiac enlargement. Calcified atherosclerotic plaque is noted within the thoracic aorta. No pleural effusion or edema. Degenerative changes involve both acromioclavicular joints.  IMPRESSION: 1. No acute cardiopulmonary abnormalities. 2. Cardiac enlargement 3. Atherosclerosis.   Electronically Signed   By: Kerby Moors M.D.   On: 07/02/2014 18:57   Dg Chest Port 1 View  07/02/2014   CLINICAL DATA:  Shortness of breath.  EXAM: PORTABLE CHEST - 1 VIEW  COMPARISON:  Chest x-ray 06/22/2014.  FINDINGS: Lung volumes are low. No consolidative airspace disease. No pleural effusions. Crowding of the pulmonary vasculature, accentuated by low lung volumes, without frank pulmonary edema. Heart size appears borderline enlarged, likely accentuated by patient's slight rotation to the right, which also distorts upper mediastinal contours. Atherosclerosis in the thoracic aorta. Status post median sternotomy for CABG.  IMPRESSION: 1. Low lung volumes without radiographic evidence of acute cardiopulmonary disease. 2. Atherosclerosis.   Electronically Signed   By: Vinnie Langton M.D.   On: 07/02/2014 13:37    Scheduled Meds: . sodium chloride   Intravenous Once  . amiodarone  200 mg Oral BID  . antiseptic oral rinse  7 mL Mouth Rinse BID  . aspirin EC  325 mg Oral BID PC  . atorvastatin  20 mg Oral  q1800  . docusate sodium  100 mg Oral BID  . famotidine  20 mg Oral Daily  . folic acid  1 mg Oral Daily  . insulin aspart  0-15 Units Subcutaneous TID WC  . insulin glargine  28  Units Subcutaneous QHS  . metoprolol tartrate  25 mg Oral BID  . multivitamin with minerals  1 tablet Oral Daily  . thiamine  100 mg Oral Daily   Or  . thiamine  100 mg Intravenous Daily   Continuous Infusions:   Principal Problem:   Arthritis of left hip Active Problems:   Status post total replacement of left hip   Delirium, drug-induced   Postoperative atrial fibrillation    Time spent: 25 min    [facility] Pager [phone]. If 7PM-7AM, please contact night-coverage at [email or web address], password [email or web address] 07/03/2014, 4:36 PM  LOS: 3 days

## 2014-07-03 NOTE — Care Management Note (Signed)
    Page 1 of 1   07/03/2014     12:37:56 PM CARE MANAGEMENT NOTE 07/03/2014  Patient:  HARLAN, ERVINE   Account Number:  [id number]  Date Initiated:  07/01/2014  Documentation initiated by:  [facility]  Subjective/Objective Assessment:   LEFT TOTAL HIP ARTHROPLASTY ANTERIOR APPROACH     Action/Plan:   FROM HOME.   Anticipated DC Date:  07/05/2014   Anticipated DC Plan:  [place]  CM consult      Choice offered to / List presented to:             Status of service:  In process, will continue to follow Medicare Important Message given?   (If response is "NO", the following Medicare IM given date fields will be blank) Date Medicare IM given:   Medicare IM given by:   Date Additional Medicare IM given:   Additional Medicare IM given by:    Discharge Disposition:    Per UR Regulation:  Reviewed for med. necessity/level of care/duration of stay  If discussed at [place] of Stay Meetings, dates discussed:    Comments:  07/03/14 Deegan Valentino RN,BSN NCM [phone] PT-SNF.POD#3 L [email or web address].PT-SNF.

## 2014-07-03 NOTE — Progress Notes (Signed)
Patient Name: Eric [name] Date of Encounter: 07/03/2014     Principal Problem:   Arthritis of left hip Active Problems:   Status post total replacement of left hip   Delirium, drug-induced   Postoperative atrial fibrillation    SUBJECTIVE  Drowsy this am. Denies chest pain or dyspnea.Taking liquids okay.  CURRENT MEDS . sodium chloride   Intravenous Once  . antiseptic oral rinse  7 mL Mouth Rinse BID  . aspirin EC  325 mg Oral BID PC  . atorvastatin  20 mg Oral q1800  . cefTRIAXone (ROCEPHIN)  IV  1 g Intravenous Q24H  . docusate sodium  100 mg Oral BID  . famotidine  20 mg Oral Daily  . folic acid  1 mg Oral Daily  . insulin aspart  0-15 Units Subcutaneous TID WC  . insulin glargine  28 Units Subcutaneous QHS  . metoprolol  5 mg Intravenous 4 times per day  . multivitamin with minerals  1 tablet Oral Daily  . thiamine  100 mg Oral Daily   Or  . thiamine  100 mg Intravenous Daily    OBJECTIVE  Filed Vitals:   07/03/14 0300 07/03/14 0400 07/03/14 0500 07/03/14 0600  BP: 134/64 155/74 134/49 104/51  Pulse: 88 110 89 99  Temp:  98.1 F (36.7 C)    TempSrc:  Oral    Resp: 21 20 19 20   Height:      Weight:      SpO2: 98% 99% 96% 94%    Intake/Output Summary (Last 24 hours) at 07/03/14 0726 Last data filed at 07/03/14 0600  Gross per 24 hour  Intake 2128.8 ml  Output   2900 ml  Net -771.2 ml   Filed Weights   06/30/14 0800 07/02/14 0400 07/03/14 0054  Weight: 205 lb (92.987 kg) 233 lb 14.5 oz (106.1 kg) 239 lb 13.8 oz (108.8 kg)    PHYSICAL EXAM  General: Pleasant, NAD. Neuro: Marland Kitchen Moves all extremities spontaneously. Psych: Normal affect. HEENT:  Normal  Neck: Supple without bruits or JVD. Lungs:  Resp regular and unlabored, Mild expiratory wheezing. Heart: RRR no s3, s4, or murmurs. Abdomen: Soft, non-tender, non-distended, BS + x 4.  Extremities: No clubbing, cyanosis or edema. DP/PT/Radials 2+ and equal bilaterally.  Accessory Clinical  Findings  CBC  Recent Labs  07/02/14 0407 07/03/14 0341  WBC 12.4* 9.6  HGB 8.6* 8.4*  HCT 24.8* 24.3*  MCV 89.5 90.7  PLT 129* 650*   Basic Metabolic Panel  Recent Labs  07/01/14 0343 07/02/14 0407  NA 135* 133*  K 5.2 4.9  CL 100 98  CO2 22 19  GLUCOSE 247* 194*  BUN 24* 27*  CREATININE 2.13* 1.87*  CALCIUM 8.4 8.5   Liver Function Tests No results found for this basename: AST, ALT, ALKPHOS, BILITOT, PROT, ALBUMIN,  in the last 72 hours No results found for this basename: LIPASE, AMYLASE,  in the last 72 hours Cardiac Enzymes No results found for this basename: CKTOTAL, CKMB, CKMBINDEX, TROPONINI,  in the last 72 hours BNP No components found with this basename: POCBNP,  D-Dimer No results found for this basename: DDIMER,  in the last 72 hours Hemoglobin A1C No results found for this basename: HGBA1C,  in the last 72 hours Fasting Lipid Panel No results found for this basename: CHOL, HDL, LDLCALC, TRIG, CHOLHDL, LDLDIRECT,  in the last 72 hours Thyroid Function Tests No results found for this basename: TSH, T4TOTAL, FREET3, T3FREE, THYROIDAB,  in  the last 72 hours  TELE  NSR with occasional sinus pause.  ECG    Radiology/Studies  Dg Chest 2 View  06/22/2014   CLINICAL DATA:  Shortness of breath, hypertension.  EXAM: CHEST  2 VIEW  COMPARISON:  01/22/2009  FINDINGS: Prior median sternotomy, CABG and valve replacement. Heart and mediastinal contours are within normal limits. No focal opacities or effusions. No acute bony abnormality.  IMPRESSION: No active cardiopulmonary disease.   Electronically Signed   By: Rolm Baptise M.D.   On: 06/22/2014 17:00   Dg Hip Complete Left  06/30/2014   CLINICAL DATA:  Left hip arthroplasty.  EXAM: DG C-ARM 1-60 MIN - NRPT [facility]; LEFT HIP - COMPLETE 2+ VIEW  COMPARISON:  11/03/2013  FINDINGS: Four portable images of the left hip show a left hip prosthesis with the acetabular and femoral components well seated and aligned.  There is no acute fracture or evidence of an operative complication.  IMPRESSION: Well aligned left hip prosthesis.   Electronically Signed   By: Lajean Manes M.D.   On: 06/30/2014 11:09   Dg Pelvis Portable  06/30/2014   CLINICAL DATA:  Left hip arthroplasty  EXAM: PORTABLE PELVIS 1-2 VIEWS  COMPARISON:  None.  FINDINGS: Total left hip arthroplasty. No dislocation. No failure or complication. Postsurgical changes in the surrounding soft tissues.  IMPRESSION: Negative.   Electronically Signed   By: Kathreen Devoid   On: 06/30/2014 12:27   Dg Chest Port 1 View  07/02/2014   CLINICAL DATA:  Stridor  EXAM: PORTABLE CHEST - 1 VIEW  COMPARISON:  07/02/2014  FINDINGS: Previous median sternotomy and CABG procedure. There is mild cardiac enlargement. Calcified atherosclerotic plaque is noted within the thoracic aorta. No pleural effusion or edema. Degenerative changes involve both acromioclavicular joints.  IMPRESSION: 1. No acute cardiopulmonary abnormalities. 2. Cardiac enlargement 3. Atherosclerosis.   Electronically Signed   By: Kerby Moors M.D.   On: 07/02/2014 18:57   Dg Chest Port 1 View  07/02/2014   CLINICAL DATA:  Shortness of breath.  EXAM: PORTABLE CHEST - 1 VIEW  COMPARISON:  Chest x-ray 06/22/2014.  FINDINGS: Lung volumes are low. No consolidative airspace disease. No pleural effusions. Crowding of the pulmonary vasculature, accentuated by low lung volumes, without frank pulmonary edema. Heart size appears borderline enlarged, likely accentuated by patient's slight rotation to the right, which also distorts upper mediastinal contours. Atherosclerosis in the thoracic aorta. Status post median sternotomy for CABG.  IMPRESSION: 1. Low lung volumes without radiographic evidence of acute cardiopulmonary disease. 2. Atherosclerosis.   Electronically Signed   By: Vinnie Langton M.D.   On: 07/02/2014 13:37   Dg Hip Portable 1 View Left  07/01/2014   CLINICAL DATA:  Severe osteoarthritis the left hip.  Left total hip replacement.  EXAM: PORTABLE LEFT HIP - 1 VIEW  COMPARISON:  Radiographs dated 06/30/2014 and 11/03/2013  FINDINGS: The acetabular and femoral components of the left hip prosthesis appear in excellent position in the AP projection. No fractures. Surgical staples are present in the skin.  IMPRESSION: Satisfactory appearance of the left hip prosthesis in the AP projection.   Electronically Signed   By: Rozetta Nunnery M.D.   On: 07/01/2014 08:37   Dg Hip Portable 1 View Left  06/30/2014   CLINICAL DATA:  Postop left hip  EXAM: PORTABLE LEFT HIP - 1 VIEW  COMPARISON:  None.  FINDINGS: Total left hip arthroplasty. No dislocation. No failure or complication. Postsurgical changes in the surrounding  soft tissues.  IMPRESSION: Total left hip arthroplasty.   Electronically Signed   By: Kathreen Devoid   On: 06/30/2014 12:26   Dg C-arm 1-60 Min-no Report  06/30/2014   CLINICAL DATA:  Left hip arthroplasty.  EXAM: DG C-ARM 1-60 MIN - NRPT [facility]; LEFT HIP - COMPLETE 2+ VIEW  COMPARISON:  11/03/2013  FINDINGS: Four portable images of the left hip show a left hip prosthesis with the acetabular and femoral components well seated and aligned. There is no acute fracture or evidence of an operative complication.  IMPRESSION: Well aligned left hip prosthesis.   Electronically Signed   By: Lajean Manes M.D.   On: 06/30/2014 11:09    ASSESSMENT AND PLAN 80 year old male with postop atrial fibrillation, paroxysmal currently in sinus rhythm with first degree AV block, delirious, coronary artery disease status post bypass surgery and aortic valve replacement, bioprosthetic, chronic kidney disease stage III, diabetes, hypertension, hyperlipidemia.  Will transition to oral amiodarone today  Will start amiodarone 200 mg twice a day by mouth. -Plan would be to discontinue amiodarone in 2-3 weeks if he continues to demonstrate sinus rhythm.  -No anticoagulation for now. If atrial fibrillation returns becomes more  persistent, we would have to consider anticoagulation. Resume oral metoprolol initially in lower dose.  Watch PR interval. -Delirium per primary team. I agree that he does not drink enough alcohol to cause alcohol withdrawal.    Signed, Darlin Coco MD

## 2014-07-03 NOTE — Progress Notes (Signed)
Patient ID: Eric [name], male   DOB: 1926/12/27, 80 y.o.   MRN: [id number] Much improved over the past 24 hours.  I appreciate Cardiology's input as well as [facility].  He looks and sounds better.  He'll likely need SNF placement for rehab following this acute hospitalization.  Likely ok for transfer to a floor bed.

## 2014-07-04 DIAGNOSIS — I4891 Unspecified atrial fibrillation: Secondary | ICD-10-CM

## 2014-07-04 DIAGNOSIS — I519 Heart disease, unspecified: Secondary | ICD-10-CM

## 2014-07-04 LAB — TYPE AND SCREEN
ABO/RH(D): A NEG
Antibody Screen: NEGATIVE
UNIT DIVISION: 0
Unit division: 0
Unit division: 0

## 2014-07-04 LAB — GLUCOSE, CAPILLARY
GLUCOSE-CAPILLARY: 168 mg/dL — AB (ref 70–99)
Glucose-Capillary: 173 mg/dL — ABNORMAL HIGH (ref 70–99)
Glucose-Capillary: 167 mg/dL — ABNORMAL HIGH (ref 70–99)
Glucose-Capillary: 166 mg/dL — ABNORMAL HIGH (ref 70–99)
Glucose-Capillary: 145 mg/dL — ABNORMAL HIGH (ref 70–99)

## 2014-07-04 LAB — CBC
HCT: 23.1 % — ABNORMAL LOW (ref 39.0–52.0)
HEMOGLOBIN: 7.9 g/dL — AB (ref 13.0–17.0)
MCH: 30.9 pg (ref 26.0–34.0)
MCHC: 34.2 g/dL (ref 30.0–36.0)
MCV: 90.2 fL (ref 78.0–100.0)
Platelets: 117 10*3/uL — ABNORMAL LOW (ref 150–400)
RBC: 2.56 MIL/uL — ABNORMAL LOW (ref 4.22–5.81)
RDW: 14.2 % (ref 11.5–15.5)
WBC: 7.6 10*3/uL (ref 4.0–10.5)

## 2014-07-04 LAB — PREPARE RBC (CROSSMATCH)

## 2014-07-04 MED ORDER — FUROSEMIDE 10 MG/ML IJ SOLN
20.0000 mg | Freq: Once | INTRAMUSCULAR | Status: AC
Start: 2014-07-04 — End: 2014-07-04
  Administered 2014-07-04: 20 mg via INTRAVENOUS
  Filled 2014-07-04: qty 2

## 2014-07-04 MED ORDER — FUROSEMIDE 10 MG/ML IJ SOLN
20.0000 mg | Freq: Once | INTRAMUSCULAR | Status: AC
  Administered 2014-07-04: 20 mg via INTRAVENOUS
  Filled 2014-07-04: qty 2

## 2014-07-04 MED ORDER — AMIODARONE HCL 200 MG PO TABS
400.0000 mg | ORAL_TABLET | Freq: Two times a day (BID) | ORAL | Status: DC
  Administered 2014-07-04 – 2014-07-05 (×3): 400 mg via ORAL
  Filled 2014-07-04 (×4): qty 2

## 2014-07-04 MED ORDER — SODIUM CHLORIDE 0.9 % IV SOLN
Freq: Once | INTRAVENOUS | Status: DC
Start: 2014-07-04 — End: 2014-07-04

## 2014-07-04 NOTE — Progress Notes (Addendum)
[facility] PROGRESS NOTE  Eric [name] KZS:[id number] DOB: 04-12-1927 DOA: 06/30/2014 PCP: [name], MD  Assessment/Plan: 1. Delirium- Resolved, likely due to oxycodone and dilaudid. Will start him on po vicodin q 6 hrs prn for pain.  Continue with ativan prn for anxiety.  2. Bilateral wheezing-  Improving,? Fluid overload vs reactive airways, had very good diuretic response with IV lasix yesterday, and will give another dose of IV lasix 20 mg x 1.   3. Anemia- sec to ABL anemia, patient to get 2 units PRBC.  4. A Fib- Patient is on amiodarone, management per cardiology.  5. Will sign off, call us again if needed  Code Status: Full code Family Communication: Called and discussed with daughter on phone on 9/13     HPI/Subjective: Patient is alert, oriented x 3 this morning, denies any complaints.  Objective: Filed Vitals:   07/04/14 1528  BP: 112/82  Pulse: 103  Temp: 97.4 F (36.3 C)  Resp: 18    Intake/Output Summary (Last 24 hours) at 07/04/14 1547 Last data filed at 07/04/14 1510  Gross per 24 hour  Intake  959.5 ml  Output   4075 ml  Net -3115.5 ml   Filed Weights   06/30/14 0800 07/02/14 0400 07/03/14 0054  Weight: 92.987 kg (205 lb) 106.1 kg (233 lb 14.5 oz) 108.8 kg (239 lb 13.8 oz)    Exam:  Physical Exam: Head: Normocephalic, atraumatic.  Eyes: No signs of jaundice, EOMI       Lungs: Normal respiratory effort.Bilateral wheezing Heart: Regular RR. S1 and S2 normal  Abdomen: BS normoactive. Soft, Nondistended, non-tender.  Extremities: No pretibial edema, no erythema   Data Reviewed: Basic Metabolic Panel:  Recent Labs Lab 07/01/14 0343 07/02/14 0407  NA 135* 133*  K 5.2 4.9  CL 100 98  CO2 22 19  GLUCOSE 247* 194*  BUN 24* 27*  CREATININE 2.13* 1.87*  CALCIUM 8.4 8.5   Liver Function Tests: No results found for this basename: AST, ALT, ALKPHOS, BILITOT, PROT, ALBUMIN,  in the last 168 hours No results found for this  basename: LIPASE, AMYLASE,  in the last 168 hours No results found for this basename: AMMONIA,  in the last 168 hours CBC:  Recent Labs Lab 06/30/14 1431 07/01/14 0343 07/02/14 0407 07/03/14 0341 07/04/14 0422  WBC 13.0* 8.1 12.4* 9.6 7.6  HGB 8.6* 9.6* 8.6* 8.4* 7.9*  HCT 25.9* 28.4* 24.8* 24.3* 23.1*  MCV 94.2 91.9 89.5 90.7 90.2  PLT 188 125* 129* 113* 117*   Cardiac Enzymes: No results found for this basename: CKTOTAL, CKMB, CKMBINDEX, TROPONINI,  in the last 168 hours BNP (last 3 results) No results found for this basename: PROBNP,  in the last 8760 hours CBG:  Recent Labs Lab 07/03/14 1706 07/03/14 2127 07/04/14 0428 07/04/14 0722 07/04/14 1224  GLUCAP 134* 162* 173* 167* 166*    Recent Results (from the past 240 hour(s))  URINE CULTURE     Status: None   Collection Time    07/01/14  3:32 AM      Result Value Ref Range Status   Specimen Description URINE, CATHETERIZED   Final   Special Requests NONE   Final   Culture  Setup Time     Final   Value: 07/01/2014 16:21     Performed at [place]     Final   Value: NO GROWTH     Performed at [facility]  Final   Value: NO GROWTH     Performed at [facility]   Report Status 07/02/2014 FINAL   Final  CULTURE, BLOOD (ROUTINE X 2)     Status: None   Collection Time    07/02/14 12:20 PM      Result Value Ref Range Status   Specimen Description BLOOD RIGHT ARM  10 ML IN [facility] BOTTLE   Final   Special Requests NONE   Final   Culture  Setup Time     Final   Value: 07/02/2014 18:27     Performed at [facility]   Culture     Final   Value:        BLOOD CULTURE RECEIVED NO GROWTH TO DATE CULTURE WILL BE HELD FOR 5 DAYS BEFORE ISSUING A FINAL NEGATIVE REPORT     Note: Culture results may be compromised due to an excessive volume of blood received in culture bottles.     Performed at [facility]   Report Status PENDING   Incomplete  CULTURE,  BLOOD (ROUTINE X 2)     Status: None   Collection Time    07/02/14 12:33 PM      Result Value Ref Range Status   Specimen Description BLOOD RIGHT ARM   Final   Special Requests     Final   Value: BOTTLES DRAWN AEROBIC AND ANAEROBIC 10 CC BLUE BOTTLE, 8 CC RED BOTTLE   Culture  Setup Time     Final   Value: 07/02/2014 18:27     Performed at [facility]   Culture     Final   Value:        BLOOD CULTURE RECEIVED NO GROWTH TO DATE CULTURE WILL BE HELD FOR 5 DAYS BEFORE ISSUING A FINAL NEGATIVE REPORT     Note: Culture results may be compromised due to an excessive volume of blood received in culture bottles.     Performed at [facility]   Report Status PENDING   Incomplete     Studies: Dg Chest Port 1 View  07/02/2014   CLINICAL DATA:  Stridor  EXAM: PORTABLE CHEST - 1 VIEW  COMPARISON:  07/02/2014  FINDINGS: Previous median sternotomy and CABG procedure. There is mild cardiac enlargement. Calcified atherosclerotic plaque is noted within the thoracic aorta. No pleural effusion or edema. Degenerative changes involve both acromioclavicular joints.  IMPRESSION: 1. No acute cardiopulmonary abnormalities. 2. Cardiac enlargement 3. Atherosclerosis.   Electronically Signed   By: Kerby Moors M.D.   On: 07/02/2014 18:57    Scheduled Meds: . amiodarone  400 mg Oral BID  . antiseptic oral rinse  7 mL Mouth Rinse BID  . aspirin EC  325 mg Oral BID PC  . atorvastatin  20 mg Oral q1800  . docusate sodium  100 mg Oral BID  . famotidine  20 mg Oral Daily  . folic acid  1 mg Oral Daily  . insulin aspart  0-15 Units Subcutaneous TID WC  . insulin glargine  28 Units Subcutaneous QHS  . metoprolol tartrate  25 mg Oral BID  . multivitamin with minerals  1 tablet Oral Daily  . thiamine  100 mg Oral Daily   Or  . thiamine  100 mg Intravenous Daily   Continuous Infusions:   Principal Problem:   Arthritis of left hip Active Problems:   Status post total replacement of left hip    Delirium, drug-induced   Postoperative atrial fibrillation  Time spent: 25 min    [facility] Pager [phone]. If 7PM-7AM, please contact night-coverage at [email or web address], password [email or web address] 07/04/2014, 3:47 PM  LOS: 4 days

## 2014-07-04 NOTE — Progress Notes (Signed)
Patient ID: Eric [name], male   DOB: 1927-09-12, 80 y.o.   MRN: [id number] Slowly making progress.  Now out of the unit.  H/H down due to acute blood loss anemia.  Will transfuse today.  Try to increase mobility.  Hopefully discharge tomorrow to short-term skilled nursing.

## 2014-07-04 NOTE — Progress Notes (Signed)
Patient Name: Eric [name] Date of Encounter: 07/04/2014     Principal Problem:   Arthritis of left hip Active Problems:   Status post total replacement of left hip   Delirium, drug-induced   Postoperative atrial fibrillation    SUBJECTIVE  No chest pain.  Feels weak. To receive packed cells today for post-op anemia.  CURRENT MEDS . amiodarone  200 mg Oral BID  . antiseptic oral rinse  7 mL Mouth Rinse BID  . aspirin EC  325 mg Oral BID PC  . atorvastatin  20 mg Oral q1800  . docusate sodium  100 mg Oral BID  . famotidine  20 mg Oral Daily  . folic acid  1 mg Oral Daily  . furosemide  20 mg Intravenous Once  . furosemide  20 mg Intravenous Once  . insulin aspart  0-15 Units Subcutaneous TID WC  . insulin glargine  28 Units Subcutaneous QHS  . metoprolol tartrate  25 mg Oral BID  . multivitamin with minerals  1 tablet Oral Daily  . thiamine  100 mg Oral Daily   Or  . thiamine  100 mg Intravenous Daily    OBJECTIVE  Filed Vitals:   07/03/14 1505 07/03/14 2103 07/04/14 0430 07/04/14 0801  BP: 157/85 133/72 102/49 119/70  Pulse: 99 117 94   Temp: 98 F (36.7 C) 98.6 F (37 C) 98.3 F (36.8 C)   TempSrc: Oral Oral Oral   Resp: 16 16 20    Height:      Weight:      SpO2: 98% 96% 96%     Intake/Output Summary (Last 24 hours) at 07/04/14 0842 Last data filed at 07/04/14 0433  Gross per 24 hour  Intake  590.1 ml  Output   3525 ml  Net -2934.9 ml   Filed Weights   06/30/14 0800 07/02/14 0400 07/03/14 0054  Weight: 205 lb (92.987 kg) 233 lb 14.5 oz (106.1 kg) 239 lb 13.8 oz (108.8 kg)    PHYSICAL EXAM  General: Pleasant, NAD. Neuro: Alert and oriented X 3. Moves all extremities spontaneously. Psych: Normal affect. HEENT:  Normal  Neck: Supple without bruits or JVD. Lungs:  Resp regular and unlabored, CTA. Heart: Rapid pulse. no s3, O9,GEXBM 2/6 systolic murmur. Abdomen: Soft, non-tender, non-distended, BS + x 4.  Extremities: No clubbing, cyanosis  or edema. DP/PT/Radials 2+ and equal bilaterally.  Accessory Clinical Findings  CBC  Recent Labs  07/03/14 0341 07/04/14 0422  WBC 9.6 7.6  HGB 8.4* 7.9*  HCT 24.3* 23.1*  MCV 90.7 90.2  PLT 113* 841*   Basic Metabolic Panel  Recent Labs  07/02/14 0407  NA 133*  K 4.9  CL 98  CO2 19  GLUCOSE 194*  BUN 27*  CREATININE 1.87*  CALCIUM 8.5   Liver Function Tests No results found for this basename: AST, ALT, ALKPHOS, BILITOT, PROT, ALBUMIN,  in the last 72 hours No results found for this basename: LIPASE, AMYLASE,  in the last 72 hours Cardiac Enzymes No results found for this basename: CKTOTAL, CKMB, CKMBINDEX, TROPONINI,  in the last 72 hours BNP No components found with this basename: POCBNP,  D-Dimer No results found for this basename: DDIMER,  in the last 72 hours Hemoglobin A1C No results found for this basename: HGBA1C,  in the last 72 hours Fasting Lipid Panel No results found for this basename: CHOL, HDL, LDLCALC, TRIG, CHOLHDL, LDLDIRECT,  in the last 72 hours Thyroid Function Tests No results found for this  basename: TSH, T4TOTAL, FREET3, T3FREE, THYROIDAB,  in the last 72 hours  TELE  Not on telemetry  ECG  Ordered.  Radiology/Studies  Dg Chest 2 View  06/22/2014   CLINICAL DATA:  Shortness of breath, hypertension.  EXAM: CHEST  2 VIEW  COMPARISON:  01/22/2009  FINDINGS: Prior median sternotomy, CABG and valve replacement. Heart and mediastinal contours are within normal limits. No focal opacities or effusions. No acute bony abnormality.  IMPRESSION: No active cardiopulmonary disease.   Electronically Signed   By: Rolm Baptise M.D.   On: 06/22/2014 17:00   Dg Hip Complete Left  06/30/2014   CLINICAL DATA:  Left hip arthroplasty.  EXAM: DG C-ARM 1-60 MIN - NRPT [facility]; LEFT HIP - COMPLETE 2+ VIEW  COMPARISON:  11/03/2013  FINDINGS: Four portable images of the left hip show a left hip prosthesis with the acetabular and femoral components well seated and  aligned. There is no acute fracture or evidence of an operative complication.  IMPRESSION: Well aligned left hip prosthesis.   Electronically Signed   By: Lajean Manes M.D.   On: 06/30/2014 11:09   Dg Pelvis Portable  06/30/2014   CLINICAL DATA:  Left hip arthroplasty  EXAM: PORTABLE PELVIS 1-2 VIEWS  COMPARISON:  None.  FINDINGS: Total left hip arthroplasty. No dislocation. No failure or complication. Postsurgical changes in the surrounding soft tissues.  IMPRESSION: Negative.   Electronically Signed   By: Kathreen Devoid   On: 06/30/2014 12:27   Dg Chest Port 1 View  07/02/2014   CLINICAL DATA:  Stridor  EXAM: PORTABLE CHEST - 1 VIEW  COMPARISON:  07/02/2014  FINDINGS: Previous median sternotomy and CABG procedure. There is mild cardiac enlargement. Calcified atherosclerotic plaque is noted within the thoracic aorta. No pleural effusion or edema. Degenerative changes involve both acromioclavicular joints.  IMPRESSION: 1. No acute cardiopulmonary abnormalities. 2. Cardiac enlargement 3. Atherosclerosis.   Electronically Signed   By: Kerby Moors M.D.   On: 07/02/2014 18:57   Dg Chest Port 1 View  07/02/2014   CLINICAL DATA:  Shortness of breath.  EXAM: PORTABLE CHEST - 1 VIEW  COMPARISON:  Chest x-ray 06/22/2014.  FINDINGS: Lung volumes are low. No consolidative airspace disease. No pleural effusions. Crowding of the pulmonary vasculature, accentuated by low lung volumes, without frank pulmonary edema. Heart size appears borderline enlarged, likely accentuated by patient's slight rotation to the right, which also distorts upper mediastinal contours. Atherosclerosis in the thoracic aorta. Status post median sternotomy for CABG.  IMPRESSION: 1. Low lung volumes without radiographic evidence of acute cardiopulmonary disease. 2. Atherosclerosis.   Electronically Signed   By: Vinnie Langton M.D.   On: 07/02/2014 13:37   Dg Hip Portable 1 View Left  07/01/2014   CLINICAL DATA:  Severe osteoarthritis the  left hip. Left total hip replacement.  EXAM: PORTABLE LEFT HIP - 1 VIEW  COMPARISON:  Radiographs dated 06/30/2014 and 11/03/2013  FINDINGS: The acetabular and femoral components of the left hip prosthesis appear in excellent position in the AP projection. No fractures. Surgical staples are present in the skin.  IMPRESSION: Satisfactory appearance of the left hip prosthesis in the AP projection.   Electronically Signed   By: Rozetta Nunnery M.D.   On: 07/01/2014 08:37   Dg Hip Portable 1 View Left  06/30/2014   CLINICAL DATA:  Postop left hip  EXAM: PORTABLE LEFT HIP - 1 VIEW  COMPARISON:  None.  FINDINGS: Total left hip arthroplasty. No dislocation. No failure or  complication. Postsurgical changes in the surrounding soft tissues.  IMPRESSION: Total left hip arthroplasty.   Electronically Signed   By: Kathreen Devoid   On: 06/30/2014 12:26   Dg C-arm 1-60 Min-no Report  06/30/2014   CLINICAL DATA:  Left hip arthroplasty.  EXAM: DG C-ARM 1-60 MIN - NRPT [facility]; LEFT HIP - COMPLETE 2+ VIEW  COMPARISON:  11/03/2013  FINDINGS: Four portable images of the left hip show a left hip prosthesis with the acetabular and femoral components well seated and aligned. There is no acute fracture or evidence of an operative complication.  IMPRESSION: Well aligned left hip prosthesis.   Electronically Signed   By: Lajean Manes M.D.   On: 06/30/2014 11:09    ASSESSMENT AND PLAN 80 year old male with postop atrial fibrillation, , coronary artery disease status post bypass surgery and aortic valve replacement, bioprosthetic, chronic kidney disease stage III, diabetes, hypertension, hyperlipidemia.  Pulse more rapid and irregular today. Will check EKG. Will increase amiodarone to 400 mg twice a day by mouth.  -Plan would be to discontinue amiodarone in 2-3 weeks if he continues to demonstrate sinus rhythm.  -No anticoagulation for now. If atrial fibrillation returns becomes more persistent, we would have to consider  anticoagulation.  Continue metoprolol. Agree with plans to transfuse.   Signed, Darlin Coco MD

## 2014-07-04 NOTE — Progress Notes (Signed)
Physical Therapy Treatment Patient Details Name: Eric [name] MRN: [id number] DOB: 03/02/1927 Today's Date: 07/04/2014    History of Present Illness s/p L DA THA; pt had L knee injected at bedside as well for improved pain control; PMHx: LLE trauma many years ago,  DM, CKD; experienced post op A fib and confusion, ABLA, hypotension.    PT Comments    Pt with very slow progress; He wants to get up today but is limited by PT this session d/t low Hgb and incr HR (90s up to 140 briefly); Hopefully this will improve after meds and getting blood today; Will check back as schedule and medical issues permit;   Follow Up Recommendations  SNF     Equipment Recommendations  None recommended by PT    Recommendations for Other Services       Precautions / Restrictions Precautions Precautions: Fall Precaution Comments: monitor VS Restrictions Weight Bearing Restrictions: No Other Position/Activity Restrictions: WBAT    Mobility  Bed Mobility Overal bed mobility: Needs Assistance;+2 for physical assistance Bed Mobility: Supine to Sit;Sit to Supine Pt attempts to assist with  Scooting up in supine with trapeze, still requires +2 to shift in bed     Supine to sit: Max assist;+2 for physical assistance Sit to supine: +2 for physical assistance;Max assist   General bed mobility comments: cues for technique, self assist; pt requires incr time to complete task; assist with trunk and LLE   Transfers                 General transfer comment: pt wanted to get to chair but deferred by PTd/t incr HR  Ambulation/Gait                 Stairs            Wheelchair Mobility    Modified Rankin (Stroke Patients Only)       Balance Overall balance assessment: Needs assistance Sitting-balance support: Feet supported;Bilateral upper extremity supported;No upper extremity supported Sitting balance-Leahy Scale: Fair Sitting balance - Comments: pt sat EOB for 103min, able  to take meds while on EOB, assist minimially with scooting laterally to [facility]                            Cognition Arousal/Alertness: Awake/alert Behavior During Therapy: WFL for tasks assessed/performed Overall Cognitive Status: Within Functional Limits for tasks assessed                      Exercises Other Exercises Other Exercises: attempts to  extend L knee, pt with much difficulty extending knee and hip    General Comments        Pertinent Vitals/Pain Pain Assessment: 0-10 Pain Score: 7  Pain Location: Left hip Pain Descriptors / Indicators: Grimacing;Cramping Pain Intervention(s): Premedicated before session;Limited activity within patient's tolerance;Monitored during session;Ice applied    Home Living                      Prior Function            PT Goals (current goals can now be found in the care plan section) Acute Rehab PT Goals Patient Stated Goal: none stated Time For Goal Achievement: 07/08/14 Potential to Achieve Goals: Good Progress towards PT goals: Not progressing toward goals - comment    Frequency  7X/week    PT Plan Current plan remains appropriate    Co-evaluation  End of Session   Activity Tolerance: Patient limited by pain;Treatment limited secondary to medical complications (Comment) Patient left: in bed;with call bell/phone within reach;with nursing/sitter in room     Time: 0930-1010 PT Time Calculation (min): 40 min  Charges:  $Therapeutic Activity: 38-52 mins                    G Codes:      Donathan Buller 08/03/14, 11:07 AM

## 2014-07-04 NOTE — Progress Notes (Signed)
Results of EKG called in to Dr. Tonia Brooms.- Sandie Ano RN

## 2014-07-04 NOTE — Progress Notes (Signed)
Patient ID: Eric [name], male   DOB: Aug 13, 1927, 80 y.o.   MRN: [id number] FL-2 signed on chart.  He looks much better overall.  Received transfusion today.  Vitals are stable.  Can likely discharge to [facility] tomorrow 9/16.

## 2014-07-05 DIAGNOSIS — M62838 Other muscle spasm: Secondary | ICD-10-CM | POA: Diagnosis not present

## 2014-07-05 DIAGNOSIS — I4891 Unspecified atrial fibrillation: Secondary | ICD-10-CM | POA: Diagnosis not present

## 2014-07-05 DIAGNOSIS — M1610 Unilateral primary osteoarthritis, unspecified hip: Secondary | ICD-10-CM | POA: Diagnosis not present

## 2014-07-05 DIAGNOSIS — I2510 Atherosclerotic heart disease of native coronary artery without angina pectoris: Secondary | ICD-10-CM | POA: Diagnosis not present

## 2014-07-05 DIAGNOSIS — S79919A Unspecified injury of unspecified hip, initial encounter: Secondary | ICD-10-CM | POA: Diagnosis not present

## 2014-07-05 DIAGNOSIS — D62 Acute posthemorrhagic anemia: Secondary | ICD-10-CM | POA: Diagnosis not present

## 2014-07-05 DIAGNOSIS — K219 Gastro-esophageal reflux disease without esophagitis: Secondary | ICD-10-CM | POA: Diagnosis not present

## 2014-07-05 DIAGNOSIS — E7800 Pure hypercholesterolemia, unspecified: Secondary | ICD-10-CM | POA: Diagnosis not present

## 2014-07-05 DIAGNOSIS — Z471 Aftercare following joint replacement surgery: Secondary | ICD-10-CM | POA: Diagnosis not present

## 2014-07-05 DIAGNOSIS — E119 Type 2 diabetes mellitus without complications: Secondary | ICD-10-CM | POA: Diagnosis not present

## 2014-07-05 DIAGNOSIS — R262 Difficulty in walking, not elsewhere classified: Secondary | ICD-10-CM | POA: Diagnosis not present

## 2014-07-05 DIAGNOSIS — I69998 Other sequelae following unspecified cerebrovascular disease: Secondary | ICD-10-CM | POA: Diagnosis not present

## 2014-07-05 DIAGNOSIS — Z96649 Presence of unspecified artificial hip joint: Secondary | ICD-10-CM

## 2014-07-05 DIAGNOSIS — M25559 Pain in unspecified hip: Secondary | ICD-10-CM | POA: Diagnosis not present

## 2014-07-05 DIAGNOSIS — K5900 Constipation, unspecified: Secondary | ICD-10-CM | POA: Diagnosis not present

## 2014-07-05 DIAGNOSIS — M6281 Muscle weakness (generalized): Secondary | ICD-10-CM | POA: Diagnosis not present

## 2014-07-05 DIAGNOSIS — I10 Essential (primary) hypertension: Secondary | ICD-10-CM | POA: Diagnosis not present

## 2014-07-05 DIAGNOSIS — I519 Heart disease, unspecified: Secondary | ICD-10-CM | POA: Diagnosis not present

## 2014-07-05 DIAGNOSIS — Z9181 History of falling: Secondary | ICD-10-CM | POA: Diagnosis not present

## 2014-07-05 DIAGNOSIS — R279 Unspecified lack of coordination: Secondary | ICD-10-CM | POA: Diagnosis not present

## 2014-07-05 LAB — CBC
HEMATOCRIT: 30.7 % — AB (ref 39.0–52.0)
Hemoglobin: 10.5 g/dL — ABNORMAL LOW (ref 13.0–17.0)
MCH: 30.3 pg (ref 26.0–34.0)
MCHC: 34.2 g/dL (ref 30.0–36.0)
MCV: 88.7 fL (ref 78.0–100.0)
PLATELETS: 148 10*3/uL — AB (ref 150–400)
RBC: 3.46 MIL/uL — AB (ref 4.22–5.81)
RDW: 15.6 % — AB (ref 11.5–15.5)
WBC: 7.3 10*3/uL (ref 4.0–10.5)

## 2014-07-05 LAB — BASIC METABOLIC PANEL
ANION GAP: 14 (ref 5–15)
BUN: 19 mg/dL (ref 6–23)
CHLORIDE: 94 meq/L — AB (ref 96–112)
CO2: 26 mEq/L (ref 19–32)
Calcium: 8.6 mg/dL (ref 8.4–10.5)
Creatinine, Ser: 1.46 mg/dL — ABNORMAL HIGH (ref 0.50–1.35)
GFR calc Af Amer: 49 mL/min — ABNORMAL LOW (ref >90)
GFR calc non Af Amer: 42 mL/min — ABNORMAL LOW (ref >90)
Glucose, Bld: 106 mg/dL — ABNORMAL HIGH (ref 70–99)
Potassium: 3.5 mEq/L — ABNORMAL LOW (ref 3.7–5.3)
SODIUM: 134 meq/L — AB (ref 137–147)

## 2014-07-05 LAB — TYPE AND SCREEN
ABO/RH(D): A NEG
ANTIBODY SCREEN: NEGATIVE
UNIT DIVISION: 0
Unit division: 0

## 2014-07-05 LAB — GLUCOSE, CAPILLARY
Glucose-Capillary: 114 mg/dL — ABNORMAL HIGH (ref 70–99)
Glucose-Capillary: 188 mg/dL — ABNORMAL HIGH (ref 70–99)

## 2014-07-05 MED ORDER — ASPIRIN 325 MG PO TBEC: 325.0000 mg | DELAYED_RELEASE_TABLET | Freq: Two times a day (BID) | ORAL | Status: DC

## 2014-07-05 MED ORDER — POTASSIUM CHLORIDE CRYS ER 20 MEQ PO TBCR
40.0000 meq | EXTENDED_RELEASE_TABLET | Freq: Two times a day (BID) | ORAL | Status: DC
  Administered 2014-07-05: 40 meq via ORAL
  Filled 2014-07-05 (×2): qty 2

## 2014-07-05 MED ORDER — AMIODARONE HCL 400 MG PO TABS: 400.0000 mg | ORAL_TABLET | Freq: Two times a day (BID) | ORAL | Status: DC

## 2014-07-05 MED ORDER — HYDROCODONE-ACETAMINOPHEN 5-325 MG PO TABS: 1.0000 | ORAL_TABLET | Freq: Four times a day (QID) | ORAL | Status: DC | PRN

## 2014-07-05 MED ORDER — METOPROLOL TARTRATE 25 MG PO TABS: 25.0000 mg | ORAL_TABLET | Freq: Two times a day (BID) | ORAL | Status: DC

## 2014-07-05 NOTE — Discharge Summary (Signed)
Patient ID: Eric [name] MRN: [id number] DOB/AGE: May 09, 1927 80 y.o.  Admit date: 06/30/2014 Discharge date: 07/05/2014  Admission Diagnoses:  Left hip arthritis  Discharge Diagnoses:  Active Problems:   Status post total replacement of left hip   Delirium, drug-induced   Postoperative atrial fibrillation Hypokalemia K+ replaced  Past Medical History  Diagnosis Date  . Cerebrovascular disease, unspecified   . Coronary atherosclerosis of unspecified type of vessel, native or graft     s/p CABG 2010  . Esophageal reflux   . Pure hypercholesterolemia   . Essential hypertension, benign   . Type II or unspecified type diabetes mellitus with neurological manifestations, not stated as uncontrolled   . Bilateral hydrocele 3/11    [facility]  . History of aortic stenosis     s/p valve replacment 2010  . Cervicalgia   . Foraminal stenosis of cervical region     bilateral-C4-C5, C5-C6  . Colon polyp     Surgeries: Procedure(s): LEFT TOTAL HIP ARTHROPLASTY ANTERIOR APPROACH on 06/30/2014   Consultants: Treatment Team:  Rounding Lbcardiology, MD  Discharged Condition: Improved  Hospital Course: Eric [name] is an 80 y.o. male who was admitted 06/30/2014 for operative treatment ofArthritis of left hip. Patient has severe unremitting pain that affects sleep, daily activities, and work/hobbies. After pre-op clearance the patient was taken to the operating room on 06/30/2014 and underwent  Procedure(s): LEFT TOTAL HIP ARTHROPLASTY ANTERIOR APPROACH.    Patient was given perioperative antibiotics: Anti-infectives   Start     Dose/Rate Route Frequency Ordered Stop   07/02/14 1200  cefTRIAXone (ROCEPHIN) 1 g in dextrose 5 % 50 mL IVPB  Status:  Discontinued     1 g 100 mL/hr over 30 Minutes Intravenous Every 24 hours 07/02/14 1155 07/03/14 0819   06/30/14 1600  ceFAZolin (ANCEF) IVPB 1 g/50 mL premix     1 g 100 mL/hr over 30 Minutes Intravenous Every 6 hours 06/30/14 1542  06/30/14 2300   06/30/14 0756  ceFAZolin (ANCEF) IVPB 2 g/50 mL premix     2 g 100 mL/hr over 30 Minutes Intravenous On call to O.R. 06/30/14 0756 06/30/14 0925       Patient was given sequential compression devices, and chemoprophylaxis to prevent DVT. Patient developed drug induced delirium post-op which has resolved . Also post-op Atrial fibrillation which is being controlled with amiodarone. Received 2 units of PRBC due for post-op anemia HgB 10.5 at discharge and asymptomatic .  Recent vital signs: Patient Vitals for the past 24 hrs:  BP Temp Temp src Pulse Resp SpO2  07/05/14 0505 121/73 mmHg 98.9 F (37.2 C) Oral 98 18 97 %  07/04/14 2150 135/78 mmHg 98.7 F (37.1 C) Oral 107 20 96 %  07/04/14 1815 118/54 mmHg 98.6 F (37 C) Oral 102 18 97 %  07/04/14 1756 114/58 mmHg 97.9 F (36.6 C) Oral 102 19 98 %  07/04/14 1728 118/74 mmHg 97.9 F (36.6 C) Oral 69 18 98 %  07/04/14 1628 122/72 mmHg 98.7 F (37.1 C) Oral - 19 95 %  07/04/14 1528 112/82 mmHg 97.4 F (36.3 C) Oral 103 18 96 %  07/04/14 1456 114/60 mmHg 97.3 F (36.3 C) Oral 92 19 -  07/04/14 1349 103/58 mmHg 98.3 F (36.8 C) Oral 92 17 97 %  07/04/14 1330 134/70 mmHg 98.9 F (37.2 C) Oral 124 18 98 %  07/04/14 1302 139/70 mmHg 98.7 F (37.1 C) Oral 110 18 98 %  07/04/14 1202 119/76 mmHg  98.2 F (36.8 C) Oral 98 19 98 %  07/04/14 1102 126/67 mmHg 98.1 F (36.7 C) Oral 97 19 96 %  07/04/14 1034 117/86 mmHg - Oral 109 - 96 %  07/04/14 0951 130/65 mmHg - - 120 - -     Recent laboratory studies:  Recent Labs  07/04/14 0422 07/05/14 0435  WBC 7.6 7.3  HGB 7.9* 10.5*  HCT 23.1* 30.7*  PLT 117* 148*  NA  --  134*  K  --  3.5*  CL  --  94*  CO2  --  26  BUN  --  19  CREATININE  --  1.46*  GLUCOSE  --  106*  CALCIUM  --  8.6     Discharge Medications:     Medication List    STOP taking these medications       aspirin 81 MG tablet  Replaced by:  aspirin 325 MG EC tablet     ramipril 5 MG capsule   Commonly known as:  ALTACE      TAKE these medications       amiodarone 400 MG tablet  Commonly known as:  PACERONE  Take 1 tablet (400 mg total) by mouth 2 (two) times daily.     aspirin 325 MG EC tablet  Take 1 tablet (325 mg total) by mouth 2 (two) times daily after a meal.     famotidine 20 MG tablet  Commonly known as:  PEPCID  Take 20 mg by mouth daily.     glipiZIDE 5 MG 24 hr tablet  Commonly known as:  GLUCOTROL XL  Take 5 mg by mouth daily with breakfast.     HYDROcodone-acetaminophen 5-325 MG per tablet  Commonly known as:  NORCO  Take 1 tablet by mouth every 6 (six) hours as needed for moderate pain. One to two tabs every 4-6 hours for pain     insulin glargine 100 UNIT/ML injection  Commonly known as:  LANTUS  Inject 28 Units into the skin at bedtime.     metoprolol tartrate 25 MG tablet  Commonly known as:  LOPRESSOR  Take 1 tablet (25 mg total) by mouth 2 (two) times daily.     nitroGLYCERIN 0.4 MG SL tablet  Commonly known as:  NITROSTAT  Place 0.4 mg under the tongue every 5 (five) minutes as needed.     simvastatin 40 MG tablet  Commonly known as:  ZOCOR  Take 40 mg by mouth daily.        Diagnostic Studies: Dg Chest 2 View  06/22/2014   CLINICAL DATA:  Shortness of breath, hypertension.  EXAM: CHEST  2 VIEW  COMPARISON:  01/22/2009  FINDINGS: Prior median sternotomy, CABG and valve replacement. Heart and mediastinal contours are within normal limits. No focal opacities or effusions. No acute bony abnormality.  IMPRESSION: No active cardiopulmonary disease.   Electronically Signed   By: Rolm Baptise M.D.   On: 06/22/2014 17:00   Dg Hip Complete Left  06/30/2014   CLINICAL DATA:  Left hip arthroplasty.  EXAM: DG C-ARM 1-60 MIN - NRPT [facility]; LEFT HIP - COMPLETE 2+ VIEW  COMPARISON:  11/03/2013  FINDINGS: Four portable images of the left hip show a left hip prosthesis with the acetabular and femoral components well seated and aligned. There is no acute  fracture or evidence of an operative complication.  IMPRESSION: Well aligned left hip prosthesis.   Electronically Signed   By: Lajean Manes M.D.   On: 06/30/2014  11:09   Dg Pelvis Portable  06/30/2014   CLINICAL DATA:  Left hip arthroplasty  EXAM: PORTABLE PELVIS 1-2 VIEWS  COMPARISON:  None.  FINDINGS: Total left hip arthroplasty. No dislocation. No failure or complication. Postsurgical changes in the surrounding soft tissues.  IMPRESSION: Negative.   Electronically Signed   By: Kathreen Devoid   On: 06/30/2014 12:27   Dg Chest Port 1 View  07/02/2014   CLINICAL DATA:  Stridor  EXAM: PORTABLE CHEST - 1 VIEW  COMPARISON:  07/02/2014  FINDINGS: Previous median sternotomy and CABG procedure. There is mild cardiac enlargement. Calcified atherosclerotic plaque is noted within the thoracic aorta. No pleural effusion or edema. Degenerative changes involve both acromioclavicular joints.  IMPRESSION: 1. No acute cardiopulmonary abnormalities. 2. Cardiac enlargement 3. Atherosclerosis.   Electronically Signed   By: Kerby Moors M.D.   On: 07/02/2014 18:57   Dg Chest Port 1 View  07/02/2014   CLINICAL DATA:  Shortness of breath.  EXAM: PORTABLE CHEST - 1 VIEW  COMPARISON:  Chest x-ray 06/22/2014.  FINDINGS: Lung volumes are low. No consolidative airspace disease. No pleural effusions. Crowding of the pulmonary vasculature, accentuated by low lung volumes, without frank pulmonary edema. Heart size appears borderline enlarged, likely accentuated by patient's slight rotation to the right, which also distorts upper mediastinal contours. Atherosclerosis in the thoracic aorta. Status post median sternotomy for CABG.  IMPRESSION: 1. Low lung volumes without radiographic evidence of acute cardiopulmonary disease. 2. Atherosclerosis.   Electronically Signed   By: Vinnie Langton M.D.   On: 07/02/2014 13:37   Dg Hip Portable 1 View Left  07/01/2014   CLINICAL DATA:  Severe osteoarthritis the left hip. Left total hip  replacement.  EXAM: PORTABLE LEFT HIP - 1 VIEW  COMPARISON:  Radiographs dated 06/30/2014 and 11/03/2013  FINDINGS: The acetabular and femoral components of the left hip prosthesis appear in excellent position in the AP projection. No fractures. Surgical staples are present in the skin.  IMPRESSION: Satisfactory appearance of the left hip prosthesis in the AP projection.   Electronically Signed   By: Rozetta Nunnery M.D.   On: 07/01/2014 08:37   Dg Hip Portable 1 View Left  06/30/2014   CLINICAL DATA:  Postop left hip  EXAM: PORTABLE LEFT HIP - 1 VIEW  COMPARISON:  None.  FINDINGS: Total left hip arthroplasty. No dislocation. No failure or complication. Postsurgical changes in the surrounding soft tissues.  IMPRESSION: Total left hip arthroplasty.   Electronically Signed   By: Kathreen Devoid   On: 06/30/2014 12:26   Dg C-arm 1-60 Min-no Report  06/30/2014   CLINICAL DATA:  Left hip arthroplasty.  EXAM: DG C-ARM 1-60 MIN - NRPT [facility]; LEFT HIP - COMPLETE 2+ VIEW  COMPARISON:  11/03/2013  FINDINGS: Four portable images of the left hip show a left hip prosthesis with the acetabular and femoral components well seated and aligned. There is no acute fracture or evidence of an operative complication.  IMPRESSION: Well aligned left hip prosthesis.   Electronically Signed   By: Lajean Manes M.D.   On: 06/30/2014 11:09    Disposition:       Discharge Instructions   Weight bearing as tolerated    Complete by:  As directed            Follow-up Information   Schedule an appointment as soon as possible for a visit in 2 weeks to follow up. (2 weeks post-op)     Call cardiologist for follow  up appointment in one week from discharge.   SignedErskine Emery 07/05/2014, 9:04 AM

## 2014-07-05 NOTE — Progress Notes (Signed)
Patient has discharge to SNF order.  Report called to [facility].  Awaiting arrival of ambulance service to transport.  Christen Bame RN

## 2014-07-05 NOTE — Discharge Instructions (Addendum)
°  Weight Bearing as tolerated  No hip precautions Progress activities slowly Expect hip and possible knee soreness and swelling. Apply heat or ice as needed. Keep dressing clean dry and intact, may shower with dressing intact.  Call cardiologist for follow up appointment within one week from discharge.

## 2014-07-05 NOTE — Progress Notes (Signed)
Clinical Social Work Department CLINICAL SOCIAL WORK PLACEMENT NOTE 07/05/2014  Patient:  Eric [name], Eric [name]  Account Number:  [id number] Admit date:  06/30/2014  Clinical Social Worker:  Werner Lean, LCSW  Date/time:  07/01/2014 02:24 PM  Clinical Social Work is seeking post-discharge placement for this patient at the following level of care:   SKILLED NURSING   (*CSW will update this form in Epic as items are completed)     Patient/family provided with [facility] [place] Department of Clinical Social Work's list of facilities offering this level of care within the geographic area requested by the patient (or if unable, by the patient's family).  07/01/2014  Patient/family informed of their freedom to choose among providers that offer the needed level of care, that participate in Medicare, Medicaid or managed care program needed by the patient, have an available bed and are willing to accept the patient.    Patient/family informed of [facility]' ownership interest in [facility], as well as of the fact that they are under no obligation to receive care at this facility.  PASARR submitted to EDS on 06/30/2014 PASARR number received on 06/30/2014  FL2 transmitted to all facilities in geographic area requested by pt/family on  07/01/2014 FL2 transmitted to all facilities within larger geographic area on   Patient informed that his/her managed care company has contracts with or will negotiate with  certain facilities, including the following:     Patient/family informed of bed offers received:  07/01/2014 Patient chooses bed at [place] Physician recommends and patient chooses bed at    Patient to be transferred to [place] [facility] on  07/05/2014 Patient to be transferred to facility by [facility] Patient and family notified of transfer on 07/05/2014 Name of family member notified:  DAUGHTER  The following physician request were entered in Epic:   Additional Comments: Pt /  daughter are in agreement with d/c to SNF today via [facility] transport. NSG reviewed d/c summary, ave scripts. Scripts included in d/c packet.  Werner Lean LCSW [phone]

## 2014-07-05 NOTE — Progress Notes (Signed)
Patient Name: Eric [name] Date of Encounter: 07/05/2014     Principal Problem:   Arthritis of left hip Active Problems:   Status post total replacement of left hip   Delirium, drug-induced   Postoperative atrial fibrillation    SUBJECTIVE  Feels better this am after blood transfusion yesterday. Pulse less rapid this am. EKG pending.  CURRENT MEDS . amiodarone  400 mg Oral BID  . antiseptic oral rinse  7 mL Mouth Rinse BID  . aspirin EC  325 mg Oral BID PC  . atorvastatin  20 mg Oral q1800  . docusate sodium  100 mg Oral BID  . famotidine  20 mg Oral Daily  . folic acid  1 mg Oral Daily  . insulin aspart  0-15 Units Subcutaneous TID WC  . insulin glargine  28 Units Subcutaneous QHS  . metoprolol tartrate  25 mg Oral BID  . multivitamin with minerals  1 tablet Oral Daily  . potassium chloride  40 mEq Oral BID  . thiamine  100 mg Oral Daily   Or  . thiamine  100 mg Intravenous Daily    OBJECTIVE  Filed Vitals:   07/04/14 1756 07/04/14 1815 07/04/14 2150 07/05/14 0505  BP: 114/58 118/54 135/78 121/73  Pulse: 102 102 107 98  Temp: 97.9 F (36.6 C) 98.6 F (37 C) 98.7 F (37.1 C) 98.9 F (37.2 C)  TempSrc: Oral Oral Oral Oral  Resp: 19 18 20 18   Height:      Weight:      SpO2: 98% 97% 96% 97%    Intake/Output Summary (Last 24 hours) at 07/05/14 0826 Last data filed at 07/05/14 0513  Gross per 24 hour  Intake 1414.5 ml  Output   5050 ml  Net -3635.5 ml   Filed Weights   06/30/14 0800 07/02/14 0400 07/03/14 0054  Weight: 205 lb (92.987 kg) 233 lb 14.5 oz (106.1 kg) 239 lb 13.8 oz (108.8 kg)    PHYSICAL EXAM  General: Pleasant, NAD. Neuro: Alert and oriented X 3. Moves all extremities spontaneously. Psych: Normal affect. HEENT:  Normal  Neck: Supple without bruits or JVD. Lungs:  Resp regular and unlabored, CTA. Heart: Irregular. no s3, s4, or murmurs. Abdomen: Soft, non-tender, non-distended, BS + x 4.  Extremities: No clubbing, cyanosis or  edema. DP/PT/Radials 2+ and equal bilaterally.  Accessory Clinical Findings  CBC  Recent Labs  07/04/14 0422 07/05/14 0435  WBC 7.6 7.3  HGB 7.9* 10.5*  HCT 23.1* 30.7*  MCV 90.2 88.7  PLT 117* 245*   Basic Metabolic Panel  Recent Labs  07/05/14 0435  NA 134*  K 3.5*  CL 94*  CO2 26  GLUCOSE 106*  BUN 19  CREATININE 1.46*  CALCIUM 8.6   Liver Function Tests No results found for this basename: AST, ALT, ALKPHOS, BILITOT, PROT, ALBUMIN,  in the last 72 hours No results found for this basename: LIPASE, AMYLASE,  in the last 72 hours Cardiac Enzymes No results found for this basename: CKTOTAL, CKMB, CKMBINDEX, TROPONINI,  in the last 72 hours BNP No components found with this basename: POCBNP,  D-Dimer No results found for this basename: DDIMER,  in the last 72 hours Hemoglobin A1C No results found for this basename: HGBA1C,  in the last 72 hours Fasting Lipid Panel No results found for this basename: CHOL, HDL, LDLCALC, TRIG, CHOLHDL, LDLDIRECT,  in the last 72 hours Thyroid Function Tests No results found for this basename: TSH, T4TOTAL, FREET3, T3FREE, THYROIDAB,  in the last 72 hours  TELE  Not on tele  ECG  Yesterday showed ectopic atrial tachycardia.  Radiology/Studies  Dg Chest 2 View  06/22/2014   CLINICAL DATA:  Shortness of breath, hypertension.  EXAM: CHEST  2 VIEW  COMPARISON:  01/22/2009  FINDINGS: Prior median sternotomy, CABG and valve replacement. Heart and mediastinal contours are within normal limits. No focal opacities or effusions. No acute bony abnormality.  IMPRESSION: No active cardiopulmonary disease.   Electronically Signed   By: Rolm Baptise M.D.   On: 06/22/2014 17:00   Dg Hip Complete Left  06/30/2014   CLINICAL DATA:  Left hip arthroplasty.  EXAM: DG C-ARM 1-60 MIN - [facility]; LEFT HIP - COMPLETE 2+ VIEW  COMPARISON:  11/03/2013  FINDINGS: Four portable images of the left hip show a left hip prosthesis with the acetabular and  femoral components well seated and aligned. There is no acute fracture or evidence of an operative complication.  IMPRESSION: Well aligned left hip prosthesis.   Electronically Signed   By: Lajean Manes M.D.   On: 06/30/2014 11:09   Dg Pelvis Portable  06/30/2014   CLINICAL DATA:  Left hip arthroplasty  EXAM: PORTABLE PELVIS 1-2 VIEWS  COMPARISON:  None.  FINDINGS: Total left hip arthroplasty. No dislocation. No failure or complication. Postsurgical changes in the surrounding soft tissues.  IMPRESSION: Negative.   Electronically Signed   By: Kathreen Devoid   On: 06/30/2014 12:27   Dg Chest Port 1 View  07/02/2014   CLINICAL DATA:  Stridor  EXAM: PORTABLE CHEST - 1 VIEW  COMPARISON:  07/02/2014  FINDINGS: Previous median sternotomy and CABG procedure. There is mild cardiac enlargement. Calcified atherosclerotic plaque is noted within the thoracic aorta. No pleural effusion or edema. Degenerative changes involve both acromioclavicular joints.  IMPRESSION: 1. No acute cardiopulmonary abnormalities. 2. Cardiac enlargement 3. Atherosclerosis.   Electronically Signed   By: Kerby Moors M.D.   On: 07/02/2014 18:57   Dg Chest Port 1 View  07/02/2014   CLINICAL DATA:  Shortness of breath.  EXAM: PORTABLE CHEST - 1 VIEW  COMPARISON:  Chest x-ray 06/22/2014.  FINDINGS: Lung volumes are low. No consolidative airspace disease. No pleural effusions. Crowding of the pulmonary vasculature, accentuated by low lung volumes, without frank pulmonary edema. Heart size appears borderline enlarged, likely accentuated by patient's slight rotation to the right, which also distorts upper mediastinal contours. Atherosclerosis in the thoracic aorta. Status post median sternotomy for CABG.  IMPRESSION: 1. Low lung volumes without radiographic evidence of acute cardiopulmonary disease. 2. Atherosclerosis.   Electronically Signed   By: Vinnie Langton M.D.   On: 07/02/2014 13:37   Dg Hip Portable 1 View Left  07/01/2014   CLINICAL  DATA:  Severe osteoarthritis the left hip. Left total hip replacement.  EXAM: PORTABLE LEFT HIP - 1 VIEW  COMPARISON:  Radiographs dated 06/30/2014 and 11/03/2013  FINDINGS: The acetabular and femoral components of the left hip prosthesis appear in excellent position in the AP projection. No fractures. Surgical staples are present in the skin.  IMPRESSION: Satisfactory appearance of the left hip prosthesis in the AP projection.   Electronically Signed   By: Rozetta Nunnery M.D.   On: 07/01/2014 08:37   Dg Hip Portable 1 View Left  06/30/2014   CLINICAL DATA:  Postop left hip  EXAM: PORTABLE LEFT HIP - 1 VIEW  COMPARISON:  None.  FINDINGS: Total left hip arthroplasty. No dislocation. No failure or complication. Postsurgical changes  in the surrounding soft tissues.  IMPRESSION: Total left hip arthroplasty.   Electronically Signed   By: Kathreen Devoid   On: 06/30/2014 12:26   Dg C-arm 1-60 Min-no Report  06/30/2014   CLINICAL DATA:  Left hip arthroplasty.  EXAM: DG C-ARM 1-60 MIN - [facility]; LEFT HIP - COMPLETE 2+ VIEW  COMPARISON:  11/03/2013  FINDINGS: Four portable images of the left hip show a left hip prosthesis with the acetabular and femoral components well seated and aligned. There is no acute fracture or evidence of an operative complication.  IMPRESSION: Well aligned left hip prosthesis.   Electronically Signed   By: Lajean Manes M.D.   On: 06/30/2014 11:09    ASSESSMENT AND PLAN 80 year old male with postop atrial fibrillation, , coronary artery disease status post bypass surgery and aortic valve replacement, bioprosthetic, chronic kidney disease stage III, diabetes, hypertension, hyperlipidemia.   continue amiodarone to 400 mg twice a day by mouth.  -Plan would be to discontinue amiodarone in 2-3 weeks if he continues to demonstrate sinus rhythm.  -No anticoagulation for now. If atrial fibrillation returns becomes more persistent, we would have to consider anticoagulation.  Continue metoprolol.    Daily EKG    Signed, Darlin Coco MD

## 2014-07-05 NOTE — Progress Notes (Signed)
Subjective: 5 Days Post-Op Procedure(s) (LRB): LEFT TOTAL HIP ARTHROPLASTY ANTERIOR APPROACH (Left) Patient reports pain as moderate.   States he feels good today no complaints. Objective: Vital signs in last 24 hours: Temp:  [97.3 F (36.3 C)-98.9 F (37.2 C)] 98.9 F (37.2 C) (09/16 0505) Pulse Rate:  [69-124] 98 (09/16 0505) Resp:  [17-20] 18 (09/16 0505) BP: (103-139)/(54-86) 121/73 mmHg (09/16 0505) SpO2:  [95 %-98 %] 97 % (09/16 0505)  Intake/Output from previous day: 09/15 0701 - 09/16 0700 In: 1414.5 [P.O.:720; Blood:694.5] Out: 5050 [Urine:5050] Intake/Output this shift:     Recent Labs  07/03/14 0341 07/04/14 0422 07/05/14 0435  HGB 8.4* 7.9* 10.5*    Recent Labs  07/04/14 0422 07/05/14 0435  WBC 7.6 7.3  RBC 2.56* 3.46*  HCT 23.1* 30.7*  PLT 117* 148*    Recent Labs  07/05/14 0435  NA 134*  K 3.5*  CL 94*  CO2 26  BUN 19  CREATININE 1.46*  GLUCOSE 106*  CALCIUM 8.6   No results found for this basename: LABPT, INR,  in the last 72 hours  Sensation intact distally Intact pulses distally Dorsiflexion/Plantar flexion intact Incision: moderate drainage Compartment soft Wound benign  Ecchymosis about incision site  Assessment/Plan: 5 Days Post-Op Procedure(s) (LRB): LEFT TOTAL HIP ARTHROPLASTY ANTERIOR APPROACH (Left) Up with therapy Discharge home with home health Hypokalemia replace K today D/c foley once up with PT  CLARK, GILBERT 07/05/2014, 8:21 AM
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
## 2014-07-06 ENCOUNTER — Other Ambulatory Visit: Payer: Self-pay | Admitting: *Deleted

## 2014-07-06 MED ORDER — HYDROCODONE-ACETAMINOPHEN 5-325 MG PO TABS: ORAL_TABLET | ORAL | Status: DC

## 2014-07-06 NOTE — Telephone Encounter (Signed)
[facility] 

## 2014-07-07 ENCOUNTER — Non-Acute Institutional Stay (SKILLED_NURSING_FACILITY): Payer: Medicare Other | Admitting: Adult Health

## 2014-07-07 DIAGNOSIS — I519 Heart disease, unspecified: Secondary | ICD-10-CM | POA: Diagnosis not present

## 2014-07-07 DIAGNOSIS — I2510 Atherosclerotic heart disease of native coronary artery without angina pectoris: Secondary | ICD-10-CM

## 2014-07-07 DIAGNOSIS — E1142 Type 2 diabetes mellitus with diabetic polyneuropathy: Secondary | ICD-10-CM

## 2014-07-07 DIAGNOSIS — M1612 Unilateral primary osteoarthritis, left hip: Secondary | ICD-10-CM

## 2014-07-07 DIAGNOSIS — I4891 Unspecified atrial fibrillation: Secondary | ICD-10-CM

## 2014-07-07 DIAGNOSIS — E1149 Type 2 diabetes mellitus with other diabetic neurological complication: Secondary | ICD-10-CM

## 2014-07-07 DIAGNOSIS — I9789 Other postprocedural complications and disorders of the circulatory system, not elsewhere classified: Secondary | ICD-10-CM

## 2014-07-07 DIAGNOSIS — K219 Gastro-esophageal reflux disease without esophagitis: Secondary | ICD-10-CM

## 2014-07-07 DIAGNOSIS — I10 Essential (primary) hypertension: Secondary | ICD-10-CM

## 2014-07-07 DIAGNOSIS — E1140 Type 2 diabetes mellitus with diabetic neuropathy, unspecified: Secondary | ICD-10-CM

## 2014-07-07 DIAGNOSIS — M1610 Unilateral primary osteoarthritis, unspecified hip: Secondary | ICD-10-CM | POA: Diagnosis not present

## 2014-07-07 DIAGNOSIS — Z96649 Presence of unspecified artificial hip joint: Secondary | ICD-10-CM

## 2014-07-07 DIAGNOSIS — E7800 Pure hypercholesterolemia, unspecified: Secondary | ICD-10-CM

## 2014-07-07 DIAGNOSIS — Z96642 Presence of left artificial hip joint: Secondary | ICD-10-CM

## 2014-07-08 ENCOUNTER — Encounter: Payer: Self-pay | Admitting: Adult Health

## 2014-07-08 DIAGNOSIS — K219 Gastro-esophageal reflux disease without esophagitis: Secondary | ICD-10-CM | POA: Insufficient documentation

## 2014-07-08 LAB — CULTURE, BLOOD (ROUTINE X 2)
CULTURE: NO GROWTH
Culture: NO GROWTH
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
## 2014-07-10 ENCOUNTER — Non-Acute Institutional Stay (SKILLED_NURSING_FACILITY): Payer: Medicare Other | Admitting: Internal Medicine

## 2014-07-10 ENCOUNTER — Other Ambulatory Visit: Payer: Self-pay | Admitting: *Deleted

## 2014-07-10 ENCOUNTER — Encounter: Payer: Self-pay | Admitting: Internal Medicine

## 2014-07-10 DIAGNOSIS — M62838 Other muscle spasm: Secondary | ICD-10-CM

## 2014-07-10 DIAGNOSIS — E7800 Pure hypercholesterolemia, unspecified: Secondary | ICD-10-CM

## 2014-07-10 DIAGNOSIS — E1122 Type 2 diabetes mellitus with diabetic chronic kidney disease: Secondary | ICD-10-CM

## 2014-07-10 DIAGNOSIS — K5900 Constipation, unspecified: Secondary | ICD-10-CM | POA: Diagnosis not present

## 2014-07-10 DIAGNOSIS — M1610 Unilateral primary osteoarthritis, unspecified hip: Secondary | ICD-10-CM

## 2014-07-10 DIAGNOSIS — N183 Chronic kidney disease, stage 3 unspecified: Secondary | ICD-10-CM

## 2014-07-10 DIAGNOSIS — D62 Acute posthemorrhagic anemia: Secondary | ICD-10-CM | POA: Diagnosis not present

## 2014-07-10 DIAGNOSIS — M1612 Unilateral primary osteoarthritis, left hip: Secondary | ICD-10-CM

## 2014-07-10 DIAGNOSIS — K219 Gastro-esophageal reflux disease without esophagitis: Secondary | ICD-10-CM

## 2014-07-10 DIAGNOSIS — I4891 Unspecified atrial fibrillation: Secondary | ICD-10-CM

## 2014-07-10 DIAGNOSIS — I9789 Other postprocedural complications and disorders of the circulatory system, not elsewhere classified: Secondary | ICD-10-CM

## 2014-07-10 DIAGNOSIS — I2510 Atherosclerotic heart disease of native coronary artery without angina pectoris: Secondary | ICD-10-CM

## 2014-07-10 DIAGNOSIS — I519 Heart disease, unspecified: Secondary | ICD-10-CM

## 2014-07-10 DIAGNOSIS — E1129 Type 2 diabetes mellitus with other diabetic kidney complication: Secondary | ICD-10-CM

## 2014-07-10 DIAGNOSIS — E876 Hypokalemia: Secondary | ICD-10-CM

## 2014-07-10 MED ORDER — HYDROCODONE-ACETAMINOPHEN 5-325 MG PO TABS: ORAL_TABLET | ORAL | Status: DC
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
## 2014-07-10 NOTE — Telephone Encounter (Signed)
[facility] 

## 2014-07-11 ENCOUNTER — Non-Acute Institutional Stay (SKILLED_NURSING_FACILITY): Payer: Medicare Other | Admitting: Adult Health

## 2014-07-11 DIAGNOSIS — M1612 Unilateral primary osteoarthritis, left hip: Secondary | ICD-10-CM

## 2014-07-11 DIAGNOSIS — Z96649 Presence of unspecified artificial hip joint: Secondary | ICD-10-CM

## 2014-07-11 DIAGNOSIS — M1610 Unilateral primary osteoarthritis, unspecified hip: Secondary | ICD-10-CM

## 2014-07-11 DIAGNOSIS — Z96642 Presence of left artificial hip joint: Secondary | ICD-10-CM

## 2014-07-11 DIAGNOSIS — I10 Essential (primary) hypertension: Secondary | ICD-10-CM | POA: Diagnosis not present

## 2014-07-11 DIAGNOSIS — I4891 Unspecified atrial fibrillation: Secondary | ICD-10-CM

## 2014-07-11 DIAGNOSIS — K219 Gastro-esophageal reflux disease without esophagitis: Secondary | ICD-10-CM

## 2014-07-11 DIAGNOSIS — I519 Heart disease, unspecified: Secondary | ICD-10-CM

## 2014-07-11 DIAGNOSIS — I9789 Other postprocedural complications and disorders of the circulatory system, not elsewhere classified: Secondary | ICD-10-CM

## 2014-07-12 ENCOUNTER — Encounter: Payer: Self-pay | Admitting: Adult Health
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
## 2014-07-13 ENCOUNTER — Other Ambulatory Visit (HOSPITAL_COMMUNITY): Payer: Self-pay | Admitting: Orthopaedic Surgery

## 2014-07-13 ENCOUNTER — Ambulatory Visit (HOSPITAL_COMMUNITY)
Admission: RE | Admit: 2014-07-13 | Discharge: 2014-07-13 | Disposition: A | Discharge status: Home / Self Care | Payer: Medicare Other | Source: Ambulatory Visit | Attending: Orthopaedic Surgery | Admitting: Orthopaedic Surgery

## 2014-07-13 DIAGNOSIS — M25559 Pain in unspecified hip: Secondary | ICD-10-CM | POA: Insufficient documentation

## 2014-07-13 DIAGNOSIS — M169 Osteoarthritis of hip, unspecified: Secondary | ICD-10-CM | POA: Diagnosis not present

## 2014-07-13 DIAGNOSIS — E1140 Type 2 diabetes mellitus with diabetic neuropathy, unspecified: Secondary | ICD-10-CM | POA: Diagnosis not present

## 2014-07-13 DIAGNOSIS — M1610 Unilateral primary osteoarthritis, unspecified hip: Secondary | ICD-10-CM | POA: Diagnosis not present

## 2014-07-13 DIAGNOSIS — Z96649 Presence of unspecified artificial hip joint: Secondary | ICD-10-CM | POA: Diagnosis not present

## 2014-07-13 DIAGNOSIS — I129 Hypertensive chronic kidney disease with stage 1 through stage 4 chronic kidney disease, or unspecified chronic kidney disease: Secondary | ICD-10-CM | POA: Diagnosis not present

## 2014-07-13 DIAGNOSIS — M79609 Pain in unspecified limb: Secondary | ICD-10-CM | POA: Diagnosis not present

## 2014-07-13 DIAGNOSIS — M79605 Pain in left leg: Secondary | ICD-10-CM

## 2014-07-13 DIAGNOSIS — M1612 Unilateral primary osteoarthritis, left hip: Secondary | ICD-10-CM

## 2014-07-13 DIAGNOSIS — M25552 Pain in left hip: Secondary | ICD-10-CM

## 2014-07-13 DIAGNOSIS — R609 Edema, unspecified: Secondary | ICD-10-CM | POA: Insufficient documentation

## 2014-07-13 DIAGNOSIS — Z471 Aftercare following joint replacement surgery: Secondary | ICD-10-CM | POA: Diagnosis not present

## 2014-07-13 DIAGNOSIS — M6281 Muscle weakness (generalized): Secondary | ICD-10-CM | POA: Diagnosis not present

## 2014-07-13 DIAGNOSIS — N183 Chronic kidney disease, stage 3 (moderate): Secondary | ICD-10-CM | POA: Diagnosis not present

## 2014-07-13 DIAGNOSIS — Z96642 Presence of left artificial hip joint: Secondary | ICD-10-CM

## 2014-07-13 DIAGNOSIS — I2510 Atherosclerotic heart disease of native coronary artery without angina pectoris: Secondary | ICD-10-CM | POA: Diagnosis not present

## 2014-07-13 NOTE — Progress Notes (Signed)
Left lower extremity venous duplex completed.  Left:  No obvious evidence of DVT, superficial thrombosis, or Baker's cyst.  Right:  Negative for DVT in the common femoral vein.  Technically difficult study due to the patient's body habitus.  

## 2014-07-14 DIAGNOSIS — M6281 Muscle weakness (generalized): Secondary | ICD-10-CM | POA: Diagnosis not present

## 2014-07-14 DIAGNOSIS — I2510 Atherosclerotic heart disease of native coronary artery without angina pectoris: Secondary | ICD-10-CM | POA: Diagnosis not present

## 2014-07-14 DIAGNOSIS — E1140 Type 2 diabetes mellitus with diabetic neuropathy, unspecified: Secondary | ICD-10-CM | POA: Diagnosis not present

## 2014-07-14 DIAGNOSIS — Z471 Aftercare following joint replacement surgery: Secondary | ICD-10-CM | POA: Diagnosis not present

## 2014-07-14 DIAGNOSIS — I129 Hypertensive chronic kidney disease with stage 1 through stage 4 chronic kidney disease, or unspecified chronic kidney disease: Secondary | ICD-10-CM | POA: Diagnosis not present

## 2014-07-14 DIAGNOSIS — N183 Chronic kidney disease, stage 3 (moderate): Secondary | ICD-10-CM | POA: Diagnosis not present

## 2014-07-17 DIAGNOSIS — E1140 Type 2 diabetes mellitus with diabetic neuropathy, unspecified: Secondary | ICD-10-CM | POA: Diagnosis not present

## 2014-07-17 DIAGNOSIS — N183 Chronic kidney disease, stage 3 (moderate): Secondary | ICD-10-CM | POA: Diagnosis not present

## 2014-07-17 DIAGNOSIS — Z471 Aftercare following joint replacement surgery: Secondary | ICD-10-CM | POA: Diagnosis not present

## 2014-07-17 DIAGNOSIS — I129 Hypertensive chronic kidney disease with stage 1 through stage 4 chronic kidney disease, or unspecified chronic kidney disease: Secondary | ICD-10-CM | POA: Diagnosis not present

## 2014-07-17 DIAGNOSIS — M6281 Muscle weakness (generalized): Secondary | ICD-10-CM | POA: Diagnosis not present

## 2014-07-17 DIAGNOSIS — I2510 Atherosclerotic heart disease of native coronary artery without angina pectoris: Secondary | ICD-10-CM | POA: Diagnosis not present

## 2014-07-18 DIAGNOSIS — M6281 Muscle weakness (generalized): Secondary | ICD-10-CM | POA: Diagnosis not present

## 2014-07-18 DIAGNOSIS — I129 Hypertensive chronic kidney disease with stage 1 through stage 4 chronic kidney disease, or unspecified chronic kidney disease: Secondary | ICD-10-CM | POA: Diagnosis not present

## 2014-07-18 DIAGNOSIS — Z471 Aftercare following joint replacement surgery: Secondary | ICD-10-CM | POA: Diagnosis not present

## 2014-07-18 DIAGNOSIS — I2510 Atherosclerotic heart disease of native coronary artery without angina pectoris: Secondary | ICD-10-CM | POA: Diagnosis not present

## 2014-07-18 DIAGNOSIS — E1140 Type 2 diabetes mellitus with diabetic neuropathy, unspecified: Secondary | ICD-10-CM | POA: Diagnosis not present

## 2014-07-18 DIAGNOSIS — N183 Chronic kidney disease, stage 3 (moderate): Secondary | ICD-10-CM | POA: Diagnosis not present

## 2014-07-20 ENCOUNTER — Ambulatory Visit: Payer: Medicare Other | Admitting: Family Medicine

## 2014-07-20 DIAGNOSIS — E1140 Type 2 diabetes mellitus with diabetic neuropathy, unspecified: Secondary | ICD-10-CM | POA: Diagnosis not present

## 2014-07-20 DIAGNOSIS — M6281 Muscle weakness (generalized): Secondary | ICD-10-CM | POA: Diagnosis not present

## 2014-07-20 DIAGNOSIS — N183 Chronic kidney disease, stage 3 (moderate): Secondary | ICD-10-CM | POA: Diagnosis not present

## 2014-07-20 DIAGNOSIS — I129 Hypertensive chronic kidney disease with stage 1 through stage 4 chronic kidney disease, or unspecified chronic kidney disease: Secondary | ICD-10-CM | POA: Diagnosis not present

## 2014-07-20 DIAGNOSIS — I2510 Atherosclerotic heart disease of native coronary artery without angina pectoris: Secondary | ICD-10-CM | POA: Diagnosis not present

## 2014-07-20 DIAGNOSIS — Z471 Aftercare following joint replacement surgery: Secondary | ICD-10-CM | POA: Diagnosis not present

## 2014-07-21 DIAGNOSIS — I2510 Atherosclerotic heart disease of native coronary artery without angina pectoris: Secondary | ICD-10-CM | POA: Diagnosis not present

## 2014-07-21 DIAGNOSIS — I129 Hypertensive chronic kidney disease with stage 1 through stage 4 chronic kidney disease, or unspecified chronic kidney disease: Secondary | ICD-10-CM | POA: Diagnosis not present

## 2014-07-21 DIAGNOSIS — M6281 Muscle weakness (generalized): Secondary | ICD-10-CM | POA: Diagnosis not present

## 2014-07-21 DIAGNOSIS — N183 Chronic kidney disease, stage 3 (moderate): Secondary | ICD-10-CM | POA: Diagnosis not present

## 2014-07-21 DIAGNOSIS — E1140 Type 2 diabetes mellitus with diabetic neuropathy, unspecified: Secondary | ICD-10-CM | POA: Diagnosis not present

## 2014-07-21 DIAGNOSIS — Z471 Aftercare following joint replacement surgery: Secondary | ICD-10-CM | POA: Diagnosis not present

## 2014-07-24 DIAGNOSIS — Z471 Aftercare following joint replacement surgery: Secondary | ICD-10-CM | POA: Diagnosis not present

## 2014-07-24 DIAGNOSIS — N183 Chronic kidney disease, stage 3 (moderate): Secondary | ICD-10-CM | POA: Diagnosis not present

## 2014-07-24 DIAGNOSIS — M6281 Muscle weakness (generalized): Secondary | ICD-10-CM | POA: Diagnosis not present

## 2014-07-24 DIAGNOSIS — I2510 Atherosclerotic heart disease of native coronary artery without angina pectoris: Secondary | ICD-10-CM | POA: Diagnosis not present

## 2014-07-24 DIAGNOSIS — I129 Hypertensive chronic kidney disease with stage 1 through stage 4 chronic kidney disease, or unspecified chronic kidney disease: Secondary | ICD-10-CM | POA: Diagnosis not present

## 2014-07-24 DIAGNOSIS — E1140 Type 2 diabetes mellitus with diabetic neuropathy, unspecified: Secondary | ICD-10-CM | POA: Diagnosis not present

## 2014-07-25 ENCOUNTER — Ambulatory Visit: Payer: Medicare Other | Admitting: Cardiology

## 2014-07-26 DIAGNOSIS — I129 Hypertensive chronic kidney disease with stage 1 through stage 4 chronic kidney disease, or unspecified chronic kidney disease: Secondary | ICD-10-CM | POA: Diagnosis not present

## 2014-07-26 DIAGNOSIS — E1140 Type 2 diabetes mellitus with diabetic neuropathy, unspecified: Secondary | ICD-10-CM | POA: Diagnosis not present

## 2014-07-26 DIAGNOSIS — Z471 Aftercare following joint replacement surgery: Secondary | ICD-10-CM | POA: Diagnosis not present

## 2014-07-26 DIAGNOSIS — N183 Chronic kidney disease, stage 3 (moderate): Secondary | ICD-10-CM | POA: Diagnosis not present

## 2014-07-26 DIAGNOSIS — M6281 Muscle weakness (generalized): Secondary | ICD-10-CM | POA: Diagnosis not present

## 2014-07-26 DIAGNOSIS — I2510 Atherosclerotic heart disease of native coronary artery without angina pectoris: Secondary | ICD-10-CM | POA: Diagnosis not present

## 2014-07-27 DIAGNOSIS — N183 Chronic kidney disease, stage 3 (moderate): Secondary | ICD-10-CM | POA: Diagnosis not present

## 2014-07-27 DIAGNOSIS — E1140 Type 2 diabetes mellitus with diabetic neuropathy, unspecified: Secondary | ICD-10-CM | POA: Diagnosis not present

## 2014-07-27 DIAGNOSIS — Z471 Aftercare following joint replacement surgery: Secondary | ICD-10-CM | POA: Diagnosis not present

## 2014-07-27 DIAGNOSIS — M6281 Muscle weakness (generalized): Secondary | ICD-10-CM | POA: Diagnosis not present

## 2014-07-27 DIAGNOSIS — I2510 Atherosclerotic heart disease of native coronary artery without angina pectoris: Secondary | ICD-10-CM | POA: Diagnosis not present

## 2014-07-27 DIAGNOSIS — I129 Hypertensive chronic kidney disease with stage 1 through stage 4 chronic kidney disease, or unspecified chronic kidney disease: Secondary | ICD-10-CM | POA: Diagnosis not present

## 2014-07-28 DIAGNOSIS — I2510 Atherosclerotic heart disease of native coronary artery without angina pectoris: Secondary | ICD-10-CM | POA: Diagnosis not present

## 2014-07-28 DIAGNOSIS — N183 Chronic kidney disease, stage 3 (moderate): Secondary | ICD-10-CM | POA: Diagnosis not present

## 2014-07-28 DIAGNOSIS — Z471 Aftercare following joint replacement surgery: Secondary | ICD-10-CM | POA: Diagnosis not present

## 2014-07-28 DIAGNOSIS — M6281 Muscle weakness (generalized): Secondary | ICD-10-CM | POA: Diagnosis not present

## 2014-07-28 DIAGNOSIS — I129 Hypertensive chronic kidney disease with stage 1 through stage 4 chronic kidney disease, or unspecified chronic kidney disease: Secondary | ICD-10-CM | POA: Diagnosis not present

## 2014-07-28 DIAGNOSIS — E1140 Type 2 diabetes mellitus with diabetic neuropathy, unspecified: Secondary | ICD-10-CM | POA: Diagnosis not present

## 2014-08-01 DIAGNOSIS — Z471 Aftercare following joint replacement surgery: Secondary | ICD-10-CM | POA: Diagnosis not present

## 2014-08-01 DIAGNOSIS — M6281 Muscle weakness (generalized): Secondary | ICD-10-CM | POA: Diagnosis not present

## 2014-08-01 DIAGNOSIS — I2510 Atherosclerotic heart disease of native coronary artery without angina pectoris: Secondary | ICD-10-CM | POA: Diagnosis not present

## 2014-08-01 DIAGNOSIS — I129 Hypertensive chronic kidney disease with stage 1 through stage 4 chronic kidney disease, or unspecified chronic kidney disease: Secondary | ICD-10-CM | POA: Diagnosis not present

## 2014-08-01 DIAGNOSIS — E1140 Type 2 diabetes mellitus with diabetic neuropathy, unspecified: Secondary | ICD-10-CM | POA: Diagnosis not present

## 2014-08-01 DIAGNOSIS — N183 Chronic kidney disease, stage 3 (moderate): Secondary | ICD-10-CM | POA: Diagnosis not present

## 2014-08-04 DIAGNOSIS — Z471 Aftercare following joint replacement surgery: Secondary | ICD-10-CM | POA: Diagnosis not present

## 2014-08-04 DIAGNOSIS — N183 Chronic kidney disease, stage 3 (moderate): Secondary | ICD-10-CM | POA: Diagnosis not present

## 2014-08-04 DIAGNOSIS — I129 Hypertensive chronic kidney disease with stage 1 through stage 4 chronic kidney disease, or unspecified chronic kidney disease: Secondary | ICD-10-CM | POA: Diagnosis not present

## 2014-08-04 DIAGNOSIS — I2510 Atherosclerotic heart disease of native coronary artery without angina pectoris: Secondary | ICD-10-CM | POA: Diagnosis not present

## 2014-08-04 DIAGNOSIS — E1140 Type 2 diabetes mellitus with diabetic neuropathy, unspecified: Secondary | ICD-10-CM | POA: Diagnosis not present

## 2014-08-04 DIAGNOSIS — M6281 Muscle weakness (generalized): Secondary | ICD-10-CM | POA: Diagnosis not present

## 2014-08-08 DIAGNOSIS — E1140 Type 2 diabetes mellitus with diabetic neuropathy, unspecified: Secondary | ICD-10-CM | POA: Diagnosis not present

## 2014-08-08 DIAGNOSIS — N183 Chronic kidney disease, stage 3 (moderate): Secondary | ICD-10-CM | POA: Diagnosis not present

## 2014-08-08 DIAGNOSIS — Z471 Aftercare following joint replacement surgery: Secondary | ICD-10-CM | POA: Diagnosis not present

## 2014-08-08 DIAGNOSIS — I129 Hypertensive chronic kidney disease with stage 1 through stage 4 chronic kidney disease, or unspecified chronic kidney disease: Secondary | ICD-10-CM | POA: Diagnosis not present

## 2014-08-08 DIAGNOSIS — M6281 Muscle weakness (generalized): Secondary | ICD-10-CM | POA: Diagnosis not present

## 2014-08-08 DIAGNOSIS — I2510 Atherosclerotic heart disease of native coronary artery without angina pectoris: Secondary | ICD-10-CM | POA: Diagnosis not present

## 2014-08-09 DIAGNOSIS — M6281 Muscle weakness (generalized): Secondary | ICD-10-CM | POA: Diagnosis not present

## 2014-08-09 DIAGNOSIS — I2510 Atherosclerotic heart disease of native coronary artery without angina pectoris: Secondary | ICD-10-CM | POA: Diagnosis not present

## 2014-08-09 DIAGNOSIS — Z471 Aftercare following joint replacement surgery: Secondary | ICD-10-CM | POA: Diagnosis not present

## 2014-08-09 DIAGNOSIS — E1140 Type 2 diabetes mellitus with diabetic neuropathy, unspecified: Secondary | ICD-10-CM | POA: Diagnosis not present

## 2014-08-09 DIAGNOSIS — N183 Chronic kidney disease, stage 3 (moderate): Secondary | ICD-10-CM | POA: Diagnosis not present

## 2014-08-09 DIAGNOSIS — I129 Hypertensive chronic kidney disease with stage 1 through stage 4 chronic kidney disease, or unspecified chronic kidney disease: Secondary | ICD-10-CM | POA: Diagnosis not present

## 2014-08-10 DIAGNOSIS — I2510 Atherosclerotic heart disease of native coronary artery without angina pectoris: Secondary | ICD-10-CM | POA: Diagnosis not present

## 2014-08-10 DIAGNOSIS — I129 Hypertensive chronic kidney disease with stage 1 through stage 4 chronic kidney disease, or unspecified chronic kidney disease: Secondary | ICD-10-CM | POA: Diagnosis not present

## 2014-08-10 DIAGNOSIS — E1140 Type 2 diabetes mellitus with diabetic neuropathy, unspecified: Secondary | ICD-10-CM | POA: Diagnosis not present

## 2014-08-10 DIAGNOSIS — Z471 Aftercare following joint replacement surgery: Secondary | ICD-10-CM | POA: Diagnosis not present

## 2014-08-10 DIAGNOSIS — N183 Chronic kidney disease, stage 3 (moderate): Secondary | ICD-10-CM | POA: Diagnosis not present

## 2014-08-10 DIAGNOSIS — M6281 Muscle weakness (generalized): Secondary | ICD-10-CM | POA: Diagnosis not present

## 2014-08-15 DIAGNOSIS — N183 Chronic kidney disease, stage 3 (moderate): Secondary | ICD-10-CM | POA: Diagnosis not present

## 2014-08-15 DIAGNOSIS — Z471 Aftercare following joint replacement surgery: Secondary | ICD-10-CM | POA: Diagnosis not present

## 2014-08-15 DIAGNOSIS — I2510 Atherosclerotic heart disease of native coronary artery without angina pectoris: Secondary | ICD-10-CM | POA: Diagnosis not present

## 2014-08-15 DIAGNOSIS — I129 Hypertensive chronic kidney disease with stage 1 through stage 4 chronic kidney disease, or unspecified chronic kidney disease: Secondary | ICD-10-CM | POA: Diagnosis not present

## 2014-08-15 DIAGNOSIS — M6281 Muscle weakness (generalized): Secondary | ICD-10-CM | POA: Diagnosis not present

## 2014-08-15 DIAGNOSIS — E1140 Type 2 diabetes mellitus with diabetic neuropathy, unspecified: Secondary | ICD-10-CM | POA: Diagnosis not present

## 2014-08-18 ENCOUNTER — Other Ambulatory Visit: Payer: Self-pay | Admitting: *Deleted

## 2014-08-18 DIAGNOSIS — N183 Chronic kidney disease, stage 3 (moderate): Secondary | ICD-10-CM | POA: Diagnosis not present

## 2014-08-18 DIAGNOSIS — I2510 Atherosclerotic heart disease of native coronary artery without angina pectoris: Secondary | ICD-10-CM | POA: Diagnosis not present

## 2014-08-18 DIAGNOSIS — Z471 Aftercare following joint replacement surgery: Secondary | ICD-10-CM | POA: Diagnosis not present

## 2014-08-18 DIAGNOSIS — I129 Hypertensive chronic kidney disease with stage 1 through stage 4 chronic kidney disease, or unspecified chronic kidney disease: Secondary | ICD-10-CM | POA: Diagnosis not present

## 2014-08-18 DIAGNOSIS — E1140 Type 2 diabetes mellitus with diabetic neuropathy, unspecified: Secondary | ICD-10-CM | POA: Diagnosis not present

## 2014-08-18 DIAGNOSIS — M6281 Muscle weakness (generalized): Secondary | ICD-10-CM | POA: Diagnosis not present

## 2014-08-18 MED ORDER — "SYRINGE/NEEDLE (DISP) 30G X 1/2"" 1 ML MISC": Status: DC

## 2014-08-21 DIAGNOSIS — I129 Hypertensive chronic kidney disease with stage 1 through stage 4 chronic kidney disease, or unspecified chronic kidney disease: Secondary | ICD-10-CM | POA: Diagnosis not present

## 2014-08-21 DIAGNOSIS — I2510 Atherosclerotic heart disease of native coronary artery without angina pectoris: Secondary | ICD-10-CM | POA: Diagnosis not present

## 2014-08-21 DIAGNOSIS — Z471 Aftercare following joint replacement surgery: Secondary | ICD-10-CM | POA: Diagnosis not present

## 2014-08-21 DIAGNOSIS — N183 Chronic kidney disease, stage 3 (moderate): Secondary | ICD-10-CM | POA: Diagnosis not present

## 2014-08-21 DIAGNOSIS — E1140 Type 2 diabetes mellitus with diabetic neuropathy, unspecified: Secondary | ICD-10-CM | POA: Diagnosis not present

## 2014-08-21 DIAGNOSIS — M6281 Muscle weakness (generalized): Secondary | ICD-10-CM | POA: Diagnosis not present

## 2014-08-24 DIAGNOSIS — I2510 Atherosclerotic heart disease of native coronary artery without angina pectoris: Secondary | ICD-10-CM | POA: Diagnosis not present

## 2014-08-24 DIAGNOSIS — E1140 Type 2 diabetes mellitus with diabetic neuropathy, unspecified: Secondary | ICD-10-CM | POA: Diagnosis not present

## 2014-08-24 DIAGNOSIS — N183 Chronic kidney disease, stage 3 (moderate): Secondary | ICD-10-CM | POA: Diagnosis not present

## 2014-08-24 DIAGNOSIS — I129 Hypertensive chronic kidney disease with stage 1 through stage 4 chronic kidney disease, or unspecified chronic kidney disease: Secondary | ICD-10-CM | POA: Diagnosis not present

## 2014-08-24 DIAGNOSIS — Z471 Aftercare following joint replacement surgery: Secondary | ICD-10-CM | POA: Diagnosis not present

## 2014-08-24 DIAGNOSIS — M6281 Muscle weakness (generalized): Secondary | ICD-10-CM | POA: Diagnosis not present

## 2014-10-17 ENCOUNTER — Other Ambulatory Visit: Payer: Self-pay | Admitting: Cardiology

## 2014-10-27 ENCOUNTER — Other Ambulatory Visit: Payer: Self-pay | Admitting: Family Medicine

## 2014-11-27 ENCOUNTER — Other Ambulatory Visit: Payer: Self-pay | Admitting: Dermatology

## 2014-11-27 DIAGNOSIS — C44319 Basal cell carcinoma of skin of other parts of face: Secondary | ICD-10-CM | POA: Diagnosis not present

## 2014-11-27 DIAGNOSIS — C44219 Basal cell carcinoma of skin of left ear and external auricular canal: Secondary | ICD-10-CM | POA: Diagnosis not present

## 2014-11-27 DIAGNOSIS — L309 Dermatitis, unspecified: Secondary | ICD-10-CM | POA: Diagnosis not present

## 2014-12-21 DIAGNOSIS — C44319 Basal cell carcinoma of skin of other parts of face: Secondary | ICD-10-CM | POA: Diagnosis not present

## 2014-12-21 DIAGNOSIS — C44219 Basal cell carcinoma of skin of left ear and external auricular canal: Secondary | ICD-10-CM | POA: Diagnosis not present

## 2014-12-27 DIAGNOSIS — Z96642 Presence of left artificial hip joint: Secondary | ICD-10-CM | POA: Diagnosis not present

## 2014-12-27 DIAGNOSIS — M25551 Pain in right hip: Secondary | ICD-10-CM | POA: Diagnosis not present

## 2015-01-02 ENCOUNTER — Other Ambulatory Visit: Payer: Self-pay | Admitting: Family Medicine

## 2015-01-04 DIAGNOSIS — C44219 Basal cell carcinoma of skin of left ear and external auricular canal: Secondary | ICD-10-CM | POA: Diagnosis not present

## 2015-01-08 ENCOUNTER — Telehealth: Payer: Self-pay

## 2015-01-08 NOTE — Telephone Encounter (Signed)
No way to leave message about flu vaccine

## 2015-01-25 ENCOUNTER — Other Ambulatory Visit: Payer: Self-pay | Admitting: Family Medicine

## 2015-02-05 ENCOUNTER — Other Ambulatory Visit: Payer: Self-pay | Admitting: Cardiology

## 2015-03-05 ENCOUNTER — Ambulatory Visit (INDEPENDENT_AMBULATORY_CARE_PROVIDER_SITE_OTHER): Payer: Medicare Other | Admitting: Family Medicine

## 2015-03-05 ENCOUNTER — Encounter: Payer: Self-pay | Admitting: Family Medicine

## 2015-03-05 VITALS — BP 132/70 | HR 69 | Temp 97.7°F | Wt 216.5 lb

## 2015-03-05 DIAGNOSIS — E119 Type 2 diabetes mellitus without complications: Secondary | ICD-10-CM | POA: Diagnosis not present

## 2015-03-05 DIAGNOSIS — E780 Pure hypercholesterolemia: Secondary | ICD-10-CM

## 2015-03-05 DIAGNOSIS — E1140 Type 2 diabetes mellitus with diabetic neuropathy, unspecified: Secondary | ICD-10-CM

## 2015-03-05 LAB — COMPREHENSIVE METABOLIC PANEL WITH GFR
ALT: 10 U/L (ref 0–53)
AST: 21 U/L (ref 0–37)
Albumin: 4 g/dL (ref 3.5–5.2)
Alkaline Phosphatase: 69 U/L (ref 39–117)
BUN: 14 mg/dL (ref 6–23)
CO2: 29 meq/L (ref 19–32)
Calcium: 9 mg/dL (ref 8.4–10.5)
Chloride: 100 meq/L (ref 96–112)
Creatinine, Ser: 1.62 mg/dL — ABNORMAL HIGH (ref 0.40–1.50)
GFR: 43.13 mL/min — ABNORMAL LOW
Glucose, Bld: 134 mg/dL — ABNORMAL HIGH (ref 70–99)
Potassium: 4.3 meq/L (ref 3.5–5.1)
Sodium: 133 meq/L — ABNORMAL LOW (ref 135–145)
Total Bilirubin: 0.7 mg/dL (ref 0.2–1.2)
Total Protein: 7.3 g/dL (ref 6.0–8.3)

## 2015-03-05 LAB — LIPID PANEL
Cholesterol: 137 mg/dL (ref 0–200)
HDL: 37 mg/dL — ABNORMAL LOW
NonHDL: 100
Total CHOL/HDL Ratio: 4
Triglycerides: 391 mg/dL — ABNORMAL HIGH (ref 0.0–149.0)
VLDL: 78.2 mg/dL — ABNORMAL HIGH (ref 0.0–40.0)

## 2015-03-05 LAB — MICROALBUMIN / CREATININE URINE RATIO
Creatinine,U: 56.4 mg/dL
Microalb Creat Ratio: 3.2 mg/g (ref 0.0–30.0)
Microalb, Ur: 1.8 mg/dL (ref 0.0–1.9)

## 2015-03-05 LAB — LDL CHOLESTEROL, DIRECT: Direct LDL: 51 mg/dL

## 2015-03-05 LAB — HEMOGLOBIN A1C: Hgb A1c MFr Bld: 7.1 % — ABNORMAL HIGH (ref 4.6–6.5)

## 2015-03-05 MED ORDER — GLIPIZIDE ER 5 MG PO TB24: 5.0000 mg | ORAL_TABLET | Freq: Every day | ORAL | Status: DC

## 2015-03-05 MED ORDER — FAMOTIDINE 20 MG PO TABS: ORAL_TABLET | ORAL | Status: DC

## 2015-03-05 NOTE — Progress Notes (Signed)
Pre visit review using our clinic review tool, if applicable. No additional management support is needed unless otherwise documented below in the visit note.  Diabetes:  Using medications without difficulties: yes Hypoglycemic episodes:no sx Hyperglycemic episodes:no sx Feet problems: no Blood Sugars averaging: not checked.  eye exam within last year: due, d/w pt.   Due for labs.   Per patient, off amiodarone.    Needed refill on famotidine.  No ADE on med.    PMH and SH reviewed  Meds, vitals, and allergies reviewed.   ROS: See HPI.  Otherwise negative.    GEN: nad, alert and oriented HEENT: mucous membranes moist NECK: supple w/o LA CV: rrr. PULM: ctab, no inc wob ABD: soft, +bs EXT: no edema

## 2015-03-05 NOTE — Patient Instructions (Signed)
Go to the lab on the way out.  We'll contact you with your lab report. Take care.  Glad to see you.   Keep the appointment with Dr. Darnell Level for the physical.

## 2015-03-07 NOTE — Assessment & Plan Note (Signed)
See notes on labs.  No change in meds.  Refills done at [place].  He'll f/u with PCP.  See AVS.

## 2015-03-21 ENCOUNTER — Other Ambulatory Visit: Payer: Self-pay | Admitting: Family Medicine

## 2015-03-22 ENCOUNTER — Other Ambulatory Visit: Payer: Medicare Other

## 2015-03-29 ENCOUNTER — Ambulatory Visit (INDEPENDENT_AMBULATORY_CARE_PROVIDER_SITE_OTHER): Payer: Medicare Other | Admitting: Family Medicine

## 2015-03-29 ENCOUNTER — Encounter: Payer: Self-pay | Admitting: Family Medicine

## 2015-03-29 VITALS — BP 132/72 | HR 56 | Temp 97.6°F | Ht 69.0 in | Wt 214.5 lb

## 2015-03-29 DIAGNOSIS — I679 Cerebrovascular disease, unspecified: Secondary | ICD-10-CM

## 2015-03-29 DIAGNOSIS — E785 Hyperlipidemia, unspecified: Secondary | ICD-10-CM

## 2015-03-29 DIAGNOSIS — I25810 Atherosclerosis of coronary artery bypass graft(s) without angina pectoris: Secondary | ICD-10-CM

## 2015-03-29 DIAGNOSIS — Z23 Encounter for immunization: Secondary | ICD-10-CM | POA: Diagnosis not present

## 2015-03-29 DIAGNOSIS — Z0000 Encounter for general adult medical examination without abnormal findings: Secondary | ICD-10-CM

## 2015-03-29 DIAGNOSIS — N183 Chronic kidney disease, stage 3 unspecified: Secondary | ICD-10-CM

## 2015-03-29 DIAGNOSIS — I10 Essential (primary) hypertension: Secondary | ICD-10-CM

## 2015-03-29 DIAGNOSIS — E1140 Type 2 diabetes mellitus with diabetic neuropathy, unspecified: Secondary | ICD-10-CM | POA: Diagnosis not present

## 2015-03-29 DIAGNOSIS — I9789 Other postprocedural complications and disorders of the circulatory system, not elsewhere classified: Secondary | ICD-10-CM

## 2015-03-29 DIAGNOSIS — N138 Other obstructive and reflux uropathy: Secondary | ICD-10-CM

## 2015-03-29 DIAGNOSIS — N401 Enlarged prostate with lower urinary tract symptoms: Secondary | ICD-10-CM

## 2015-03-29 DIAGNOSIS — K219 Gastro-esophageal reflux disease without esophagitis: Secondary | ICD-10-CM

## 2015-03-29 DIAGNOSIS — Z7189 Other specified counseling: Secondary | ICD-10-CM | POA: Insufficient documentation

## 2015-03-29 DIAGNOSIS — E7800 Pure hypercholesterolemia, unspecified: Secondary | ICD-10-CM

## 2015-03-29 DIAGNOSIS — Z96642 Presence of left artificial hip joint: Secondary | ICD-10-CM

## 2015-03-29 DIAGNOSIS — I4891 Unspecified atrial fibrillation: Secondary | ICD-10-CM

## 2015-03-29 NOTE — Assessment & Plan Note (Signed)
Chronic, asxs. Continue aspirin and statin.

## 2015-03-29 NOTE — Assessment & Plan Note (Signed)

## 2015-03-29 NOTE — Assessment & Plan Note (Addendum)
Continue current regimen. A1c great control for age. No hypoglycemic symptoms. No changes indicated.

## 2015-03-29 NOTE — Assessment & Plan Note (Signed)
Reviewed with patient - stable over several years.

## 2015-03-29 NOTE — Progress Notes (Signed)
Pre visit review using our clinic review tool, if applicable. No additional management support is needed unless otherwise documented below in the visit note. 

## 2015-03-29 NOTE — Assessment & Plan Note (Signed)
Sounds regular today. Will continue to monitor.

## 2015-03-29 NOTE — Assessment & Plan Note (Signed)
Chronic, stable. Continue current regimen. 

## 2015-03-29 NOTE — Assessment & Plan Note (Signed)
Advanced directives: HCPOA - would want daughter to be this. Has living will at home in safe. Asked to bring copy.

## 2015-03-29 NOTE — Assessment & Plan Note (Signed)
Reviewed great LDL and elevated trig - discussed dietary choices to improve #s.

## 2015-03-29 NOTE — Patient Instructions (Addendum)
Pass by to schedule eye exam at [facility] at your convenience.  prevnar today. Bring [place] a copy of your living will. Good to see you today, call us with questions. Return as needed or in 1 year for next medicare wellness visit. Start using lotrimin over the counter antifungal cream between toes. Try to pat dry after each shower between toes.

## 2015-03-29 NOTE — Addendum Note (Signed)
Addended by: Royann Shivers A on: 03/29/2015 12:21 PM   Modules accepted: Orders

## 2015-03-29 NOTE — Assessment & Plan Note (Signed)
Denies sxs. 

## 2015-03-29 NOTE — Assessment & Plan Note (Signed)
Continue aspirin and statin. 

## 2015-03-29 NOTE — Progress Notes (Signed)
BP 132/72 mmHg  Pulse 56  Temp(Src) 97.6 F (36.4 C) (Oral)  Ht 5\' 9"  (1.753 m)  Wt 214 lb 8 oz (97.297 kg)  BMI 31.66 kg/m2   CC: medicare wellness visit  Subjective:    Patient ID: Eric [name], male    DOB: December 27, 1926, 81 y.o.   MRN: [id number]  HPI: Eric [name] is a 80 y.o. male presenting on 03/29/2015 for Annual Exam   81 yo with h/o T2DM with neuropathy, HLD, HTN, CAD s/p 3v CABG and AS s/p AVR with pericardial tissue 2010, CVA, CRI, and BPH. Last seen by myself 10/2013. Had L total hip replacement 06/2014 by Dr Ninfa Linden  Uses cane regularly.  DM - doesn't check sugars. Denies low sugar symptoms. No recent eye exam.  Prior was seeing [place].   Today noticed tick L thigh - removed today. No redness around site, no new rashes.   R>L hearing loss noted. Declines audiology referral. Passes vision screen today. Overdue for eye exam. Denies recent falls. Denies mood issues - depression, anhedonia.  Preventative: Colon screening - last colonoscopy in our records 1997 with mult polyps. Declines further interventions. When gets constipated takes MoM and resolves. No blood in stool that he's noted. Prostate screening - aged out. Flu shot -  Pneumovax 2002, prevnar - today Tetanus - 2002 Shingles - doesn't want. Advanced directives: HCPOA - would want daughter to be this. Has living will at home in safe.  Caffeine: 2-3 cups coffee  Lives with son  Retired Merchant navy officer, lives with 2 sons, 1 daughter in [place] Activity: no regular exercise   Relevant past medical, surgical, family and social history reviewed and updated as indicated. Interim medical history since our last visit reviewed. Allergies and medications reviewed and updated. Current Outpatient Prescriptions on File Prior to Visit  Medication Sig  . famotidine (PEPCID) 20 MG tablet Take one tablet daily  . glipiZIDE (GLUCOTROL XL) 5 MG 24 hr tablet Take 1 tablet (5 mg total) by mouth  daily with breakfast.  . insulin glargine (LANTUS) 100 UNIT/ML injection Inject 28 Units into the skin at bedtime.  . metoprolol (LOPRESSOR) 50 MG tablet TAKE 1 TABLET TWICE A DAY  . nitroGLYCERIN (NITROSTAT) 0.4 MG SL tablet Place 0.4 mg under the tongue every 5 (five) minutes as needed.    . simvastatin (ZOCOR) 40 MG tablet Take one tablet at bedtime **MUST HAVE PHYSICAL FOR FURTHER REFILLS**  . Syringe/Needle, Disp, 30G X 1/2" 1 ML MISC Use as directed to inject Lantus   No current facility-administered medications on file prior to visit.    Review of Systems Per HPI unless specifically indicated above     Objective:    BP 132/72 mmHg  Pulse 56  Temp(Src) 97.6 F (36.4 C) (Oral)  Ht 5\' 9"  (1.753 m)  Wt 214 lb 8 oz (97.297 kg)  BMI 31.66 kg/m2  Wt Readings from Last 3 Encounters:  03/29/15 214 lb 8 oz (97.297 kg)  03/05/15 216 lb 8 oz (98.204 kg)  07/11/14 209 lb (94.802 kg)    Physical Exam  Constitutional: He is oriented to person, place, and time. He appears well-developed and well-nourished. No distress.  Uses cane regularly.  HENT:  Head: Normocephalic and atraumatic.  Right Ear: Tympanic membrane, external ear and ear canal normal. Decreased hearing is noted.  Left Ear: Tympanic membrane, external ear and ear canal normal. Decreased hearing is noted.  Nose: Nose normal.  Mouth/Throat: Uvula is midline,  oropharynx is clear and moist and mucous membranes are normal. No oropharyngeal exudate, posterior oropharyngeal edema or posterior oropharyngeal erythema.  Cerumen R>L ear  Eyes: Conjunctivae and EOM are normal. Pupils are equal, round, and reactive to light. No scleral icterus.  Neck: Normal range of motion. Neck supple. No thyromegaly present.  Cardiovascular: Normal rate, regular rhythm, normal heart sounds and intact distal pulses.   No murmur heard. Pulses:      Radial pulses are 2+ on the right side, and 2+ on the left side.  Pulmonary/Chest: Effort normal and  breath sounds normal. No respiratory distress. He has no wheezes. He has no rales.  Abdominal: Soft. Bowel sounds are normal. He exhibits no distension and no mass. There is no tenderness. There is no rebound and no guarding.  Musculoskeletal: Normal range of motion. He exhibits no edema.  See HPI for foot exam if done  Lymphadenopathy:    He has no cervical adenopathy.  Neurological: He is alert and oriented to person, place, and time.  CN grossly intact, station and gait intact  Skin: Skin is warm and dry. No rash noted.  Psychiatric: He has a normal mood and affect. His behavior is normal. Judgment and thought content normal.  Nursing note and vitals reviewed.  Results for orders placed or performed in visit on 03/05/15  Comprehensive metabolic panel  Result Value Ref Range   Sodium 133 (L) 135 - 145 mEq/L   Potassium 4.3 3.5 - 5.1 mEq/L   Chloride 100 96 - 112 mEq/L   CO2 29 19 - 32 mEq/L   Glucose, Bld 134 (H) 70 - 99 mg/dL   BUN 14 6 - 23 mg/dL   Creatinine, Ser 1.62 (H) 0.40 - 1.50 mg/dL   Total Bilirubin 0.7 0.2 - 1.2 mg/dL   Alkaline Phosphatase 69 39 - 117 U/L   AST 21 0 - 37 U/L   ALT 10 0 - 53 U/L   Total Protein 7.3 6.0 - 8.3 g/dL   Albumin 4.0 3.5 - 5.2 g/dL   Calcium 9.0 8.4 - 10.5 mg/dL   GFR 43.13 (L) >60.00 mL/min  Lipid panel  Result Value Ref Range   Cholesterol 137 0 - 200 mg/dL   Triglycerides 391.0 (H) 0.0 - 149.0 mg/dL   HDL 37.00 (L) >39.00 mg/dL   VLDL 78.2 (H) 0.0 - 40.0 mg/dL   Total CHOL/HDL Ratio 4    NonHDL 100.00   Hemoglobin A1c  Result Value Ref Range   Hgb A1c MFr Bld 7.1 (H) 4.6 - 6.5 %  Microalbumin / creatinine urine ratio  Result Value Ref Range   Microalb, Ur 1.8 0.0 - 1.9 mg/dL   Creatinine,U 56.4 mg/dL   Microalb Creat Ratio 3.2 0.0 - 30.0 mg/g  LDL cholesterol, direct  Result Value Ref Range   Direct LDL 51.0 mg/dL      Assessment & Plan:   Problem List Items Addressed This Visit    Advanced care planning/counseling  discussion    Advanced directives: HCPOA - would want daughter to be this. Has living will at home in safe. Asked to bring copy.      BPH (benign prostatic hypertrophy) with urinary obstruction    Denies sxs.      Cerebrovascular disease    Continue aspirin and statin.      Relevant Medications   ramipril (ALTACE) 5 MG capsule   aspirin 81 MG tablet   CKD (chronic kidney disease) stage 3, GFR 30-59 ml/min  Reviewed with patient - stable over several years.      Coronary atherosclerosis    Chronic, asxs. Continue aspirin and statin.      Relevant Medications   ramipril (ALTACE) 5 MG capsule   aspirin 81 MG tablet   GERD (gastroesophageal reflux disease)    Controlled with PRN antihistamine.      HYPERCHOLESTEROLEMIA    Reviewed great LDL and elevated trig - discussed dietary choices to improve #s.      Relevant Medications   ramipril (ALTACE) 5 MG capsule   aspirin 81 MG tablet   HYPERTENSION, BENIGN SYSTEMIC    Chronic, stable. Continue current regimen.      Relevant Medications   ramipril (ALTACE) 5 MG capsule   aspirin 81 MG tablet   Medicare annual wellness visit, subsequent - Primary    I have personally reviewed the Medicare Annual Wellness questionnaire and have noted 1. The patient's medical and social history 2. Their use of alcohol, tobacco or illicit drugs 3. Their current medications and supplements 4. The patient's functional ability including ADL's, fall risks, home safety risks and hearing or visual impairment. 5. Diet and physical activity 6. Evidence for depression or mood disorders The patients weight, height, BMI have been recorded in the chart.  Hearing and vision has been addressed. I have made referrals, counseling and provided education to the patient based review of the above and I have provided the pt with a written personalized care plan for preventive services. Provider list updated - see scanned questionairre. Reviewed preventative  protocols and updated unless pt declined.       Postoperative atrial fibrillation    Sounds regular today. Will continue to monitor.      Relevant Medications   ramipril (ALTACE) 5 MG capsule   aspirin 81 MG tablet   Status post total replacement of left hip   Type 2 diabetes, controlled, with neuropathy    Continue current regimen. A1c great control for age. No hypoglycemic symptoms. No changes indicated.      Relevant Medications   ramipril (ALTACE) 5 MG capsule   aspirin 81 MG tablet       Follow up plan: Return in about 1 year (around 03/28/2016), or as needed, for medicare wellness visit.

## 2015-03-29 NOTE — Assessment & Plan Note (Signed)
Controlled with PRN antihistamine.

## 2015-04-02 ENCOUNTER — Other Ambulatory Visit: Payer: Self-pay | Admitting: Family Medicine

## 2015-04-22 NOTE — Progress Notes (Signed)
HPI: FU AVR and coronary artery disease. His previous workup showed three-vessel coronary disease and severe aortic stenosis. The patient therefore on December 22 2008, underwent aortic valve replacement with #25-mm [facility] Ease pericardial tissue valve as well as coronary artery bypassing graft with a LIMA to the LAD, saphenous vein graft to the circ marginal and the saphenous vein graft to the PDA. Echo March 2014 showed normal LV function, prosthetic aortic valve with mean grad 15 mmHg, biatrial enlargement and mild MR. Carotid Dopplers in May 2015 showed a 60-79% right and 1-39% left stenosis. Followup recommended in six months. Since I last saw him, he has mild dyspnea on exertion but no orthopnea, PND, pedal edema, chest pain or syncope.  Current Outpatient Prescriptions  Medication Sig Dispense Refill  . aspirin 81 MG tablet Take 81 mg by mouth daily.    . famotidine (PEPCID) 20 MG tablet Take one tablet daily 90 tablet 0  . glipiZIDE (GLUCOTROL XL) 5 MG 24 hr tablet Take 1 tablet (5 mg total) by mouth daily with breakfast. 90 tablet 0  . insulin glargine (LANTUS) 100 UNIT/ML injection Inject 28 Units into the skin at bedtime.    . metoprolol (LOPRESSOR) 50 MG tablet TAKE 1 TABLET TWICE A DAY 180 tablet 1  . nitroGLYCERIN (NITROSTAT) 0.4 MG SL tablet Place 0.4 mg under the tongue every 5 (five) minutes as needed.      . ramipril (ALTACE) 5 MG capsule Take 5 mg by mouth daily.    . simvastatin (ZOCOR) 40 MG tablet Take 1 tablet (40 mg total) by mouth at bedtime. 90 tablet 3  . Syringe/Needle, Disp, 30G X 1/2" 1 ML MISC Use as directed to inject Lantus 50 each 3   No current facility-administered medications for this visit.     Past Medical History  Diagnosis Date  . Cerebrovascular disease, unspecified   . Coronary atherosclerosis of unspecified type of vessel, native or graft     s/p CABG 2010  . Esophageal reflux   . Pure hypercholesterolemia   . Essential hypertension,  benign   . Type II or unspecified type diabetes mellitus with neurological manifestations, not stated as uncontrolled   . Bilateral hydrocele 3/11    [facility]  . History of aortic stenosis     s/p valve replacment 2010  . Cervicalgia   . Foraminal stenosis of cervical region     bilateral-C4-C5, C5-C6  . Colon polyp   . Shingles 07/27/2012    Past Surgical History  Procedure Laterality Date  . Cataract extraction  1990's    bilateral  . Cholecystectomy  1995  . Colonoscopy  1997    Mult. polyps- [facility]  . Sphincterectomy  09/20/03    for jaundice  . Ptca  12/99    with stent  . Coronary artery bypass graft  3/10    x3-using a left internal mammary artery to the left anterior descending coronary artery, saphenous vein graft to circumflex marginal branch, spahenous vein graft  to posterior descendingcoronary artery. Endoscopic saphenous vein harvest from bilateral thighs was done.   . Aortic valve replacement  12/2008    with pericardial tissue valve  . Carotid US  10/2009    B ICA stenosis, stable disease, rec rpt 2 years  . Hydrocele excision      bilateral ([facility])  . L leg trauma  1957    truck over left leg  . Total hip arthroplasty Left 06/30/2014  Procedure: LEFT TOTAL HIP ARTHROPLASTY ANTERIOR APPROACH;  Surgeon: Mcarthur Rossetti, MD;  Location: [facility];  Service: Orthopedics;  Laterality: Left;    History   Social History  . Marital Status: Widowed    Spouse Name: N/A  . Number of Children: 3  . Years of Education: N/A   Occupational History  . retired-truck driver    Social History Main Topics  . Smoking status: Former Smoker    Quit date: 10/20/1993  . Smokeless tobacco: Never Used  . Alcohol Use: Yes     Comment: occasional  . Drug Use: No  . Sexual Activity: No   Other Topics Concern  . Not on file   Social History Narrative   Caffeine: 2-3 cups coffee   Lives with son   Retired Educational psychologist, lives with 2 sons,  1 daughter in [place]   Activity: no regular exercise 2/2 hip pain and SOB.    ROS: left hip arthralgias but no fevers or chills, productive cough, hemoptysis, dysphasia, odynophagia, melena, hematochezia, dysuria, hematuria, rash, seizure activity, orthopnea, PND, pedal edema, claudication. Remaining systems are negative.  Physical Exam: Well-developed well-nourished in no acute distress.  Skin is warm and dry.  HEENT is normal.  Neck is supple.  Chest is clear to auscultation with normal expansion.  Cardiovascular exam is regular rate and rhythm.  Abdominal exam nontender or distended. No masses palpated. Extremities show trace edema. neuro grossly intact  ECG sinus rhythm at a rate of 79. First-degree AV block. Left anterior fascicular block. Cannot rule out anterior infarct.

## 2015-04-24 ENCOUNTER — Encounter: Payer: Self-pay | Admitting: Cardiology

## 2015-04-24 ENCOUNTER — Ambulatory Visit (INDEPENDENT_AMBULATORY_CARE_PROVIDER_SITE_OTHER): Payer: Medicare Other | Admitting: Cardiology

## 2015-04-24 ENCOUNTER — Encounter: Payer: Self-pay | Admitting: *Deleted

## 2015-04-24 VITALS — BP 144/72 | HR 79 | Ht 69.0 in | Wt 216.0 lb

## 2015-04-24 DIAGNOSIS — I679 Cerebrovascular disease, unspecified: Secondary | ICD-10-CM

## 2015-04-24 DIAGNOSIS — I6523 Occlusion and stenosis of bilateral carotid arteries: Secondary | ICD-10-CM | POA: Diagnosis not present

## 2015-04-24 DIAGNOSIS — I10 Essential (primary) hypertension: Secondary | ICD-10-CM | POA: Diagnosis not present

## 2015-04-24 NOTE — Assessment & Plan Note (Signed)
Blood pressure controlled. Continue present medications. 

## 2015-04-24 NOTE — Assessment & Plan Note (Signed)
Continue SBE prophylaxis. 

## 2015-04-24 NOTE — Assessment & Plan Note (Signed)
Continue statin. 

## 2015-04-24 NOTE — Patient Instructions (Signed)
Your physician wants you to follow-up in: ONE YEAR WITH DR CRENSHAW You will receive a reminder letter in the mail two months in advance. If you don't receive a letter, please call our office to schedule the follow-up appointment.   Your physician has requested that you have a carotid duplex. This test is an ultrasound of the carotid arteries in your neck. It looks at blood flow through these arteries that supply the brain with blood. Allow one hour for this exam. There are no restrictions or special instructions.   

## 2015-04-24 NOTE — Assessment & Plan Note (Signed)
Continue aspirin and statin. 

## 2015-04-24 NOTE — Assessment & Plan Note (Signed)
Continue aspirin and statin.schedule follow-up carotid Dopplers.

## 2015-05-08 ENCOUNTER — Other Ambulatory Visit: Payer: Self-pay

## 2015-05-08 MED ORDER — SYRINGE/NEEDLE (DISP) 30G X 1/2" 1 ML MISC
Status: DC
Start: 2015-05-08 — End: 2016-01-17

## 2015-05-08 MED ORDER — INSULIN GLARGINE 100 UNIT/ML ~~LOC~~ SOLN: 28.0000 [IU] | Freq: Every day | SUBCUTANEOUS | Status: DC

## 2015-05-08 NOTE — Telephone Encounter (Signed)
Pt left note requesting [facility]ill [facility]us [facility]. Margarita Grizzle pts daughter (DPR signed and unable to reach pt) notified done.

## 2015-05-09 ENCOUNTER — Inpatient Hospital Stay (HOSPITAL_COMMUNITY): Admission: RE | Admit: 2015-05-09 | Payer: Medicare Other | Source: Ambulatory Visit

## 2015-05-21 ENCOUNTER — Ambulatory Visit (HOSPITAL_COMMUNITY)
Admission: RE | Admit: 2015-05-21 | Discharge: 2015-05-21 | Disposition: A | Discharge status: Home / Self Care | Payer: Medicare Other | Source: Ambulatory Visit | Attending: Cardiology | Admitting: Cardiology

## 2015-05-21 DIAGNOSIS — I6523 Occlusion and stenosis of bilateral carotid arteries: Secondary | ICD-10-CM

## 2015-05-22 ENCOUNTER — Other Ambulatory Visit: Payer: Self-pay | Admitting: Cardiology

## 2015-05-22 MED ORDER — RAMIPRIL 5 MG PO CAPS: 5.0000 mg | ORAL_CAPSULE | Freq: Every day | ORAL | Status: DC

## 2015-06-01 ENCOUNTER — Other Ambulatory Visit: Payer: Self-pay | Admitting: Family Medicine

## 2015-08-02 ENCOUNTER — Other Ambulatory Visit: Payer: Self-pay | Admitting: Cardiology

## 2015-08-02 MED ORDER — METOPROLOL TARTRATE 50 MG PO TABS: 50.0000 mg | ORAL_TABLET | Freq: Two times a day (BID) | ORAL | Status: DC

## 2015-08-02 NOTE — Addendum Note (Signed)
Addended by: Fidel Levy on: 08/02/2015 03:01 PM   Modules accepted: Orders

## 2015-08-02 NOTE — Telephone Encounter (Signed)
Rx(s) sent to pharmacy electronically.  

## 2015-10-03 ENCOUNTER — Other Ambulatory Visit: Payer: Self-pay | Admitting: Family Medicine

## 2015-10-17 ENCOUNTER — Encounter (HOSPITAL_COMMUNITY): Payer: Self-pay | Admitting: Cardiology

## 2015-10-21 DIAGNOSIS — J449 Chronic obstructive pulmonary disease, unspecified: Secondary | ICD-10-CM

## 2015-10-21 HISTORY — DX: Chronic obstructive pulmonary disease, unspecified: J44.9

## 2015-12-19 DIAGNOSIS — I330 Acute and subacute infective endocarditis: Secondary | ICD-10-CM

## 2015-12-19 HISTORY — DX: Acute and subacute infective endocarditis: I33.0

## 2015-12-25 ENCOUNTER — Emergency Department (HOSPITAL_COMMUNITY): Payer: Medicare Other

## 2015-12-25 ENCOUNTER — Telehealth: Payer: Self-pay

## 2015-12-25 ENCOUNTER — Inpatient Hospital Stay (HOSPITAL_COMMUNITY)
Admission: EM | Admit: 2015-12-25 | Discharge: 2016-01-17 | DRG: 871 | Disposition: A | Discharge status: Skilled Nursing Facility | Payer: Medicare Other | Attending: Family Medicine | Admitting: Family Medicine

## 2015-12-25 ENCOUNTER — Inpatient Hospital Stay (HOSPITAL_COMMUNITY): Payer: Medicare Other

## 2015-12-25 ENCOUNTER — Encounter (HOSPITAL_COMMUNITY): Payer: Self-pay | Admitting: Emergency Medicine

## 2015-12-25 DIAGNOSIS — I959 Hypotension, unspecified: Secondary | ICD-10-CM | POA: Diagnosis present

## 2015-12-25 DIAGNOSIS — E785 Hyperlipidemia, unspecified: Secondary | ICD-10-CM | POA: Diagnosis present

## 2015-12-25 DIAGNOSIS — J189 Pneumonia, unspecified organism: Secondary | ICD-10-CM | POA: Diagnosis not present

## 2015-12-25 DIAGNOSIS — I340 Nonrheumatic mitral (valve) insufficiency: Secondary | ICD-10-CM | POA: Diagnosis not present

## 2015-12-25 DIAGNOSIS — Z953 Presence of xenogenic heart valve: Secondary | ICD-10-CM | POA: Diagnosis not present

## 2015-12-25 DIAGNOSIS — I051 Rheumatic mitral insufficiency: Secondary | ICD-10-CM | POA: Diagnosis present

## 2015-12-25 DIAGNOSIS — L89311 Pressure ulcer of right buttock, stage 1: Secondary | ICD-10-CM | POA: Diagnosis present

## 2015-12-25 DIAGNOSIS — K5900 Constipation, unspecified: Secondary | ICD-10-CM | POA: Diagnosis not present

## 2015-12-25 DIAGNOSIS — E876 Hypokalemia: Secondary | ICD-10-CM | POA: Diagnosis not present

## 2015-12-25 DIAGNOSIS — N183 Chronic kidney disease, stage 3 unspecified: Secondary | ICD-10-CM

## 2015-12-25 DIAGNOSIS — I63413 Cerebral infarction due to embolism of bilateral middle cerebral arteries: Secondary | ICD-10-CM | POA: Diagnosis not present

## 2015-12-25 DIAGNOSIS — I350 Nonrheumatic aortic (valve) stenosis: Secondary | ICD-10-CM | POA: Diagnosis not present

## 2015-12-25 DIAGNOSIS — E872 Acidosis, unspecified: Secondary | ICD-10-CM | POA: Diagnosis present

## 2015-12-25 DIAGNOSIS — E7800 Pure hypercholesterolemia, unspecified: Secondary | ICD-10-CM | POA: Diagnosis present

## 2015-12-25 DIAGNOSIS — I2510 Atherosclerotic heart disease of native coronary artery without angina pectoris: Secondary | ICD-10-CM | POA: Diagnosis not present

## 2015-12-25 DIAGNOSIS — R270 Ataxia, unspecified: Secondary | ICD-10-CM | POA: Diagnosis not present

## 2015-12-25 DIAGNOSIS — I639 Cerebral infarction, unspecified: Secondary | ICD-10-CM | POA: Diagnosis not present

## 2015-12-25 DIAGNOSIS — R296 Repeated falls: Secondary | ICD-10-CM | POA: Diagnosis not present

## 2015-12-25 DIAGNOSIS — I272 Other secondary pulmonary hypertension: Secondary | ICD-10-CM | POA: Diagnosis present

## 2015-12-25 DIAGNOSIS — R531 Weakness: Secondary | ICD-10-CM | POA: Diagnosis not present

## 2015-12-25 DIAGNOSIS — R402144 Coma scale, eyes open, spontaneous, 24 hours or more after hospital admission: Secondary | ICD-10-CM | POA: Diagnosis not present

## 2015-12-25 DIAGNOSIS — I6340 Cerebral infarction due to embolism of unspecified cerebral artery: Secondary | ICD-10-CM | POA: Diagnosis not present

## 2015-12-25 DIAGNOSIS — R410 Disorientation, unspecified: Secondary | ICD-10-CM | POA: Diagnosis present

## 2015-12-25 DIAGNOSIS — E860 Dehydration: Secondary | ICD-10-CM | POA: Insufficient documentation

## 2015-12-25 DIAGNOSIS — K920 Hematemesis: Secondary | ICD-10-CM | POA: Diagnosis not present

## 2015-12-25 DIAGNOSIS — E871 Hypo-osmolality and hyponatremia: Secondary | ICD-10-CM | POA: Diagnosis present

## 2015-12-25 DIAGNOSIS — R402364 Coma scale, best motor response, obeys commands, 24 hours or more after hospital admission: Secondary | ICD-10-CM | POA: Diagnosis not present

## 2015-12-25 DIAGNOSIS — Z79899 Other long term (current) drug therapy: Secondary | ICD-10-CM

## 2015-12-25 DIAGNOSIS — Z794 Long term (current) use of insulin: Secondary | ICD-10-CM

## 2015-12-25 DIAGNOSIS — R0603 Acute respiratory distress: Secondary | ICD-10-CM | POA: Diagnosis present

## 2015-12-25 DIAGNOSIS — S0990XA Unspecified injury of head, initial encounter: Secondary | ICD-10-CM | POA: Diagnosis not present

## 2015-12-25 DIAGNOSIS — I330 Acute and subacute infective endocarditis: Secondary | ICD-10-CM | POA: Diagnosis not present

## 2015-12-25 DIAGNOSIS — Z96642 Presence of left artificial hip joint: Secondary | ICD-10-CM | POA: Diagnosis present

## 2015-12-25 DIAGNOSIS — R1310 Dysphagia, unspecified: Secondary | ICD-10-CM | POA: Diagnosis present

## 2015-12-25 DIAGNOSIS — E1122 Type 2 diabetes mellitus with diabetic chronic kidney disease: Secondary | ICD-10-CM | POA: Diagnosis present

## 2015-12-25 DIAGNOSIS — Z9049 Acquired absence of other specified parts of digestive tract: Secondary | ICD-10-CM | POA: Diagnosis not present

## 2015-12-25 DIAGNOSIS — E1149 Type 2 diabetes mellitus with other diabetic neurological complication: Secondary | ICD-10-CM | POA: Diagnosis present

## 2015-12-25 DIAGNOSIS — Z8249 Family history of ischemic heart disease and other diseases of the circulatory system: Secondary | ICD-10-CM | POA: Diagnosis not present

## 2015-12-25 DIAGNOSIS — I2781 Cor pulmonale (chronic): Secondary | ICD-10-CM | POA: Diagnosis present

## 2015-12-25 DIAGNOSIS — A419 Sepsis, unspecified organism: Secondary | ICD-10-CM | POA: Diagnosis not present

## 2015-12-25 DIAGNOSIS — S79912A Unspecified injury of left hip, initial encounter: Secondary | ICD-10-CM | POA: Diagnosis not present

## 2015-12-25 DIAGNOSIS — E669 Obesity, unspecified: Secondary | ICD-10-CM | POA: Diagnosis present

## 2015-12-25 DIAGNOSIS — N289 Disorder of kidney and ureter, unspecified: Secondary | ICD-10-CM | POA: Diagnosis not present

## 2015-12-25 DIAGNOSIS — Z7982 Long term (current) use of aspirin: Secondary | ICD-10-CM | POA: Diagnosis not present

## 2015-12-25 DIAGNOSIS — J80 Acute respiratory distress syndrome: Secondary | ICD-10-CM | POA: Diagnosis not present

## 2015-12-25 DIAGNOSIS — G92 Toxic encephalopathy: Secondary | ICD-10-CM | POA: Diagnosis not present

## 2015-12-25 DIAGNOSIS — J209 Acute bronchitis, unspecified: Secondary | ICD-10-CM | POA: Diagnosis present

## 2015-12-25 DIAGNOSIS — Z952 Presence of prosthetic heart valve: Secondary | ICD-10-CM | POA: Diagnosis not present

## 2015-12-25 DIAGNOSIS — R1312 Dysphagia, oropharyngeal phase: Secondary | ICD-10-CM | POA: Diagnosis not present

## 2015-12-25 DIAGNOSIS — N179 Acute kidney failure, unspecified: Secondary | ICD-10-CM | POA: Insufficient documentation

## 2015-12-25 DIAGNOSIS — I69322 Dysarthria following cerebral infarction: Secondary | ICD-10-CM | POA: Diagnosis not present

## 2015-12-25 DIAGNOSIS — L89321 Pressure ulcer of left buttock, stage 1: Secondary | ICD-10-CM | POA: Diagnosis present

## 2015-12-25 DIAGNOSIS — J441 Chronic obstructive pulmonary disease with (acute) exacerbation: Secondary | ICD-10-CM | POA: Diagnosis present

## 2015-12-25 DIAGNOSIS — I69321 Dysphasia following cerebral infarction: Secondary | ICD-10-CM | POA: Diagnosis not present

## 2015-12-25 DIAGNOSIS — D649 Anemia, unspecified: Secondary | ICD-10-CM | POA: Diagnosis present

## 2015-12-25 DIAGNOSIS — IMO0002 Reserved for concepts with insufficient information to code with codable children: Secondary | ICD-10-CM | POA: Diagnosis present

## 2015-12-25 DIAGNOSIS — I6523 Occlusion and stenosis of bilateral carotid arteries: Secondary | ICD-10-CM | POA: Diagnosis present

## 2015-12-25 DIAGNOSIS — R0602 Shortness of breath: Secondary | ICD-10-CM

## 2015-12-25 DIAGNOSIS — K219 Gastro-esophageal reflux disease without esophagitis: Secondary | ICD-10-CM | POA: Diagnosis present

## 2015-12-25 DIAGNOSIS — L8990 Pressure ulcer of unspecified site, unspecified stage: Secondary | ICD-10-CM | POA: Insufficient documentation

## 2015-12-25 DIAGNOSIS — I6330 Cerebral infarction due to thrombosis of unspecified cerebral artery: Secondary | ICD-10-CM | POA: Diagnosis not present

## 2015-12-25 DIAGNOSIS — E118 Type 2 diabetes mellitus with unspecified complications: Secondary | ICD-10-CM | POA: Diagnosis not present

## 2015-12-25 DIAGNOSIS — R509 Fever, unspecified: Secondary | ICD-10-CM

## 2015-12-25 DIAGNOSIS — R0600 Dyspnea, unspecified: Secondary | ICD-10-CM | POA: Diagnosis not present

## 2015-12-25 DIAGNOSIS — R7881 Bacteremia: Secondary | ICD-10-CM | POA: Diagnosis not present

## 2015-12-25 DIAGNOSIS — Z452 Encounter for adjustment and management of vascular access device: Secondary | ICD-10-CM | POA: Diagnosis not present

## 2015-12-25 DIAGNOSIS — M25552 Pain in left hip: Secondary | ICD-10-CM | POA: Diagnosis not present

## 2015-12-25 DIAGNOSIS — R471 Dysarthria and anarthria: Secondary | ICD-10-CM | POA: Diagnosis not present

## 2015-12-25 DIAGNOSIS — I420 Dilated cardiomyopathy: Secondary | ICD-10-CM | POA: Diagnosis present

## 2015-12-25 DIAGNOSIS — M6281 Muscle weakness (generalized): Secondary | ICD-10-CM | POA: Diagnosis not present

## 2015-12-25 DIAGNOSIS — J440 Chronic obstructive pulmonary disease with acute lower respiratory infection: Secondary | ICD-10-CM | POA: Diagnosis not present

## 2015-12-25 DIAGNOSIS — R4189 Other symptoms and signs involving cognitive functions and awareness: Secondary | ICD-10-CM | POA: Diagnosis not present

## 2015-12-25 DIAGNOSIS — R140 Abdominal distension (gaseous): Secondary | ICD-10-CM | POA: Diagnosis not present

## 2015-12-25 DIAGNOSIS — J9601 Acute respiratory failure with hypoxia: Secondary | ICD-10-CM | POA: Diagnosis present

## 2015-12-25 DIAGNOSIS — B951 Streptococcus, group B, as the cause of diseases classified elsewhere: Secondary | ICD-10-CM | POA: Diagnosis not present

## 2015-12-25 DIAGNOSIS — J40 Bronchitis, not specified as acute or chronic: Secondary | ICD-10-CM | POA: Diagnosis not present

## 2015-12-25 DIAGNOSIS — R262 Difficulty in walking, not elsewhere classified: Secondary | ICD-10-CM | POA: Diagnosis not present

## 2015-12-25 DIAGNOSIS — R402254 Coma scale, best verbal response, oriented, 24 hours or more after hospital admission: Secondary | ICD-10-CM | POA: Diagnosis not present

## 2015-12-25 DIAGNOSIS — Z66 Do not resuscitate: Secondary | ICD-10-CM | POA: Diagnosis present

## 2015-12-25 DIAGNOSIS — I130 Hypertensive heart and chronic kidney disease with heart failure and stage 1 through stage 4 chronic kidney disease, or unspecified chronic kidney disease: Secondary | ICD-10-CM | POA: Diagnosis present

## 2015-12-25 DIAGNOSIS — Z6831 Body mass index (BMI) 31.0-31.9, adult: Secondary | ICD-10-CM | POA: Diagnosis not present

## 2015-12-25 DIAGNOSIS — I5033 Acute on chronic diastolic (congestive) heart failure: Secondary | ICD-10-CM | POA: Diagnosis not present

## 2015-12-25 DIAGNOSIS — I339 Acute and subacute endocarditis, unspecified: Secondary | ICD-10-CM | POA: Diagnosis not present

## 2015-12-25 DIAGNOSIS — Z954 Presence of other heart-valve replacement: Secondary | ICD-10-CM | POA: Diagnosis not present

## 2015-12-25 DIAGNOSIS — R4781 Slurred speech: Secondary | ICD-10-CM | POA: Insufficient documentation

## 2015-12-25 DIAGNOSIS — I679 Cerebrovascular disease, unspecified: Secondary | ICD-10-CM | POA: Diagnosis present

## 2015-12-25 DIAGNOSIS — Z955 Presence of coronary angioplasty implant and graft: Secondary | ICD-10-CM

## 2015-12-25 DIAGNOSIS — I4892 Unspecified atrial flutter: Secondary | ICD-10-CM | POA: Diagnosis present

## 2015-12-25 DIAGNOSIS — I471 Supraventricular tachycardia: Secondary | ICD-10-CM | POA: Diagnosis present

## 2015-12-25 DIAGNOSIS — Z951 Presence of aortocoronary bypass graft: Secondary | ICD-10-CM | POA: Diagnosis not present

## 2015-12-25 DIAGNOSIS — E861 Hypovolemia: Secondary | ICD-10-CM | POA: Diagnosis present

## 2015-12-25 DIAGNOSIS — E1165 Type 2 diabetes mellitus with hyperglycemia: Secondary | ICD-10-CM | POA: Diagnosis present

## 2015-12-25 DIAGNOSIS — Z87891 Personal history of nicotine dependence: Secondary | ICD-10-CM | POA: Diagnosis not present

## 2015-12-25 DIAGNOSIS — R41841 Cognitive communication deficit: Secondary | ICD-10-CM | POA: Diagnosis not present

## 2015-12-25 DIAGNOSIS — I480 Paroxysmal atrial fibrillation: Secondary | ICD-10-CM | POA: Diagnosis present

## 2015-12-25 DIAGNOSIS — I668 Occlusion and stenosis of other cerebral arteries: Secondary | ICD-10-CM | POA: Diagnosis not present

## 2015-12-25 DIAGNOSIS — A401 Sepsis due to streptococcus, group B: Principal | ICD-10-CM | POA: Diagnosis not present

## 2015-12-25 DIAGNOSIS — I4891 Unspecified atrial fibrillation: Secondary | ICD-10-CM | POA: Diagnosis present

## 2015-12-25 DIAGNOSIS — E1169 Type 2 diabetes mellitus with other specified complication: Secondary | ICD-10-CM | POA: Insufficient documentation

## 2015-12-25 DIAGNOSIS — R062 Wheezing: Secondary | ICD-10-CM

## 2015-12-25 DIAGNOSIS — I729 Aneurysm of unspecified site: Secondary | ICD-10-CM

## 2015-12-25 DIAGNOSIS — M25551 Pain in right hip: Secondary | ICD-10-CM | POA: Diagnosis not present

## 2015-12-25 DIAGNOSIS — Z9181 History of falling: Secondary | ICD-10-CM | POA: Diagnosis not present

## 2015-12-25 DIAGNOSIS — R278 Other lack of coordination: Secondary | ICD-10-CM | POA: Diagnosis not present

## 2015-12-25 HISTORY — DX: Acute and subacute infective endocarditis: I33.0

## 2015-12-25 HISTORY — DX: Unspecified atrial fibrillation: I48.91

## 2015-12-25 LAB — BLOOD GAS, ARTERIAL
Acid-base deficit: 12.1 mmol/L — ABNORMAL HIGH (ref 0.0–2.0)
BICARBONATE: 12.9 meq/L — AB (ref 20.0–24.0)
DRAWN BY: 270271
FIO2: 0.21
O2 SAT: 94.4 %
Patient temperature: 98.6
TCO2: 13.7 mmol/L (ref 0–100)
pCO2 arterial: 26.3 mmHg — ABNORMAL LOW (ref 35.0–45.0)
pH, Arterial: 7.312 — ABNORMAL LOW (ref 7.350–7.450)
pO2, Arterial: 81.9 mmHg (ref 80.0–100.0)

## 2015-12-25 LAB — CBC WITH DIFFERENTIAL/PLATELET
Basophils Absolute: 0 10*3/uL (ref 0.0–0.1)
Basophils Relative: 0 %
EOS ABS: 0 10*3/uL (ref 0.0–0.7)
Eosinophils Relative: 0 %
HEMATOCRIT: 32.6 % — AB (ref 39.0–52.0)
HEMOGLOBIN: 10.7 g/dL — AB (ref 13.0–17.0)
LYMPHS ABS: 0.5 10*3/uL — AB (ref 0.7–4.0)
LYMPHS PCT: 4 %
MCH: 29.8 pg (ref 26.0–34.0)
MCHC: 32.8 g/dL (ref 30.0–36.0)
MCV: 90.8 fL (ref 78.0–100.0)
Monocytes Absolute: 0.5 10*3/uL (ref 0.1–1.0)
Monocytes Relative: 3 %
NEUTROS ABS: 12.9 10*3/uL — AB (ref 1.7–7.7)
NEUTROS PCT: 93 %
Platelets: 138 10*3/uL — ABNORMAL LOW (ref 150–400)
RBC: 3.59 MIL/uL — AB (ref 4.22–5.81)
RDW: 13.8 % (ref 11.5–15.5)
WBC: 13.9 10*3/uL — AB (ref 4.0–10.5)

## 2015-12-25 LAB — INFLUENZA PANEL BY PCR (TYPE A & B)
H1N1 flu by pcr: NOT DETECTED
Influenza A By PCR: NEGATIVE
Influenza B By PCR: NEGATIVE

## 2015-12-25 LAB — CREATININE, SERUM
Creatinine, Ser: 2.75 mg/dL — ABNORMAL HIGH (ref 0.61–1.24)
GFR calc Af Amer: 22 mL/min — ABNORMAL LOW (ref >60)
GFR calc non Af Amer: 19 mL/min — ABNORMAL LOW (ref >60)

## 2015-12-25 LAB — URINE MICROSCOPIC-ADD ON

## 2015-12-25 LAB — I-STAT ARTERIAL BLOOD GAS, ED
ACID-BASE DEFICIT: 8 mmol/L — AB (ref 0.0–2.0)
Acid-base deficit: 12 mmol/L — ABNORMAL HIGH (ref 0.0–2.0)
BICARBONATE: 15.4 meq/L — AB (ref 20.0–24.0)
Bicarbonate: 12.3 mEq/L — ABNORMAL LOW (ref 20.0–24.0)
O2 SAT: 99 %
O2 Saturation: 100 %
PO2 ART: 183 mmHg — AB (ref 80.0–100.0)
PO2 ART: 155 mmHg — AB (ref 80.0–100.0)
Patient temperature: 98
TCO2: 16 mmol/L (ref 0–100)
TCO2: 13 mmol/L (ref 0–100)
pCO2 arterial: 24.7 mmHg — ABNORMAL LOW (ref 35.0–45.0)
pCO2 arterial: 24 mmHg — ABNORMAL LOW (ref 35.0–45.0)
pH, Arterial: 7.403 (ref 7.350–7.450)
pH, Arterial: 7.318 — ABNORMAL LOW (ref 7.350–7.450)

## 2015-12-25 LAB — COMPREHENSIVE METABOLIC PANEL
ALK PHOS: 60 U/L (ref 38–126)
ALT: 29 U/L (ref 17–63)
AST: 54 U/L — ABNORMAL HIGH (ref 15–41)
Albumin: 3 g/dL — ABNORMAL LOW (ref 3.5–5.0)
Anion gap: 12 (ref 5–15)
BILIRUBIN TOTAL: 1.6 mg/dL — AB (ref 0.3–1.2)
BUN: 35 mg/dL — ABNORMAL HIGH (ref 6–20)
CALCIUM: 8.8 mg/dL — AB (ref 8.9–10.3)
CO2: 22 mmol/L (ref 22–32)
CREATININE: 2.58 mg/dL — AB (ref 0.61–1.24)
Chloride: 97 mmol/L — ABNORMAL LOW (ref 101–111)
GFR calc non Af Amer: 21 mL/min — ABNORMAL LOW (ref >60)
GFR, EST AFRICAN AMERICAN: 24 mL/min — AB (ref >60)
GLUCOSE: 253 mg/dL — AB (ref 65–99)
Potassium: 4.2 mmol/L (ref 3.5–5.1)
SODIUM: 131 mmol/L — AB (ref 135–145)
Total Protein: 6.8 g/dL (ref 6.5–8.1)

## 2015-12-25 LAB — URINALYSIS, ROUTINE W REFLEX MICROSCOPIC
GLUCOSE, UA: 100 mg/dL — AB
KETONES UR: 15 mg/dL — AB
Leukocytes, UA: NEGATIVE
NITRITE: NEGATIVE
PH: 5.5 (ref 5.0–8.0)
Protein, ur: 100 mg/dL — AB
Specific Gravity, Urine: 1.021 (ref 1.005–1.030)

## 2015-12-25 LAB — TROPONIN I
Troponin I: 0.11 ng/mL — ABNORMAL HIGH (ref <0.031)
Troponin I: 0.36 ng/mL — ABNORMAL HIGH (ref <0.031)

## 2015-12-25 LAB — PROCALCITONIN: Procalcitonin: 1.98 ng/mL

## 2015-12-25 LAB — CBG MONITORING, ED: Glucose-Capillary: 300 mg/dL — ABNORMAL HIGH (ref 65–99)

## 2015-12-25 MED ORDER — METHYLPREDNISOLONE SODIUM SUCC 125 MG IJ SOLR
60.0000 mg | Freq: Four times a day (QID) | INTRAMUSCULAR | Status: DC
  Administered 2015-12-25 – 2015-12-26 (×3): 60 mg via INTRAVENOUS
  Filled 2015-12-25 (×3): qty 2

## 2015-12-25 MED ORDER — METHYLPREDNISOLONE SODIUM SUCC 125 MG IJ SOLR
125.0000 mg | Freq: Once | INTRAMUSCULAR | Status: AC
  Administered 2015-12-25: 125 mg via INTRAVENOUS
  Filled 2015-12-25: qty 2

## 2015-12-25 MED ORDER — SIMVASTATIN 40 MG PO TABS
40.0000 mg | ORAL_TABLET | Freq: Every day | ORAL | Status: DC
  Filled 2015-12-25: qty 1

## 2015-12-25 MED ORDER — SODIUM CHLORIDE 0.9 % IV SOLN
Freq: Once | INTRAVENOUS | Status: AC
  Administered 2015-12-25: 13:00:00 via INTRAVENOUS

## 2015-12-25 MED ORDER — MAGNESIUM SULFATE 2 GM/50ML IV SOLN
2.0000 g | Freq: Once | INTRAVENOUS | Status: AC
  Administered 2015-12-25: 2 g via INTRAVENOUS
  Filled 2015-12-25: qty 50

## 2015-12-25 MED ORDER — ALBUTEROL (5 MG/ML) CONTINUOUS INHALATION SOLN
10.0000 mg/h | INHALATION_SOLUTION | Freq: Once | RESPIRATORY_TRACT | Status: AC
  Administered 2015-12-25: 10 mg/h via RESPIRATORY_TRACT
  Filled 2015-12-25: qty 20

## 2015-12-25 MED ORDER — ASPIRIN EC 81 MG PO TBEC: 81.0000 mg | DELAYED_RELEASE_TABLET | Freq: Every day | ORAL | Status: DC

## 2015-12-25 MED ORDER — IPRATROPIUM BROMIDE 0.02 % IN SOLN
0.5000 mg | Freq: Once | RESPIRATORY_TRACT | Status: AC
  Administered 2015-12-25: 0.5 mg via RESPIRATORY_TRACT
  Filled 2015-12-25: qty 2.5

## 2015-12-25 MED ORDER — MORPHINE SULFATE (PF) 2 MG/ML IV SOLN
1.0000 mg | INTRAVENOUS | Status: DC | PRN
  Administered 2015-12-25: 1 mg via INTRAVENOUS
  Filled 2015-12-25: qty 1

## 2015-12-25 MED ORDER — DILTIAZEM HCL 25 MG/5ML IV SOLN
10.0000 mg | INTRAVENOUS | Status: DC | PRN
  Filled 2015-12-25 (×2): qty 5

## 2015-12-25 MED ORDER — DEXTROSE 5 % IV SOLN
5.0000 mg/h | INTRAVENOUS | Status: DC
  Administered 2015-12-26: 5 mg/h via INTRAVENOUS
  Filled 2015-12-25: qty 100

## 2015-12-25 MED ORDER — LEVOFLOXACIN IN D5W 500 MG/100ML IV SOLN
500.0000 mg | INTRAVENOUS | Status: DC
  Administered 2015-12-27: 500 mg via INTRAVENOUS
  Filled 2015-12-25: qty 100

## 2015-12-25 MED ORDER — IPRATROPIUM-ALBUTEROL 0.5-2.5 (3) MG/3ML IN SOLN
3.0000 mL | RESPIRATORY_TRACT | Status: DC
  Administered 2015-12-25 (×2): 3 mL via RESPIRATORY_TRACT
  Filled 2015-12-25 (×4): qty 3

## 2015-12-25 MED ORDER — GUAIFENESIN ER 600 MG PO TB12
1200.0000 mg | ORAL_TABLET | Freq: Two times a day (BID) | ORAL | Status: DC
  Administered 2015-12-26 – 2015-12-28 (×5): 1200 mg via ORAL
  Filled 2015-12-25 (×6): qty 2

## 2015-12-25 MED ORDER — HYDRALAZINE HCL 20 MG/ML IJ SOLN: 5.0000 mg | INTRAMUSCULAR | Status: DC | PRN

## 2015-12-25 MED ORDER — SODIUM CHLORIDE 0.9 % IV SOLN
INTRAVENOUS | Status: DC
  Administered 2015-12-25: 18:00:00 via INTRAVENOUS

## 2015-12-25 MED ORDER — ONDANSETRON HCL 4 MG/2ML IJ SOLN
4.0000 mg | Freq: Four times a day (QID) | INTRAMUSCULAR | Status: DC | PRN
  Administered 2015-12-25: 4 mg via INTRAVENOUS
  Filled 2015-12-25: qty 2

## 2015-12-25 MED ORDER — LEVOFLOXACIN IN D5W 750 MG/150ML IV SOLN
750.0000 mg | Freq: Once | INTRAVENOUS | Status: AC
  Administered 2015-12-25: 750 mg via INTRAVENOUS
  Filled 2015-12-25: qty 150

## 2015-12-25 MED ORDER — METOPROLOL TARTRATE 25 MG PO TABS: 50.0000 mg | ORAL_TABLET | Freq: Two times a day (BID) | ORAL | Status: DC

## 2015-12-25 MED ORDER — PROMETHAZINE HCL 25 MG/ML IJ SOLN
12.5000 mg | Freq: Four times a day (QID) | INTRAMUSCULAR | Status: DC | PRN
  Administered 2015-12-25: 12.5 mg via INTRAVENOUS
  Filled 2015-12-25: qty 1

## 2015-12-25 MED ORDER — HEPARIN SODIUM (PORCINE) 5000 UNIT/ML IJ SOLN
5000.0000 [IU] | Freq: Three times a day (TID) | INTRAMUSCULAR | Status: DC
  Administered 2015-12-25 (×2): 5000 [IU] via SUBCUTANEOUS
  Filled 2015-12-25 (×2): qty 1

## 2015-12-25 MED ORDER — FAMOTIDINE 20 MG PO TABS
20.0000 mg | ORAL_TABLET | Freq: Every day | ORAL | Status: DC
  Administered 2015-12-26: 20 mg via ORAL
  Filled 2015-12-25: qty 1

## 2015-12-25 MED ORDER — INSULIN ASPART 100 UNIT/ML ~~LOC~~ SOLN
0.0000 [IU] | Freq: Three times a day (TID) | SUBCUTANEOUS | Status: DC
Start: 2015-12-25 — End: 2015-12-26
  Administered 2015-12-25: 11 [IU] via SUBCUTANEOUS
  Filled 2015-12-25: qty 1

## 2015-12-25 MED ORDER — ACETAMINOPHEN 325 MG PO TABS
650.0000 mg | ORAL_TABLET | Freq: Four times a day (QID) | ORAL | Status: DC | PRN
  Administered 2015-12-30 – 2016-01-04 (×2): 650 mg via ORAL
  Filled 2015-12-25 (×2): qty 2

## 2015-12-25 MED ORDER — SODIUM CHLORIDE 0.9% FLUSH
3.0000 mL | Freq: Two times a day (BID) | INTRAVENOUS | Status: DC
  Administered 2015-12-25 – 2016-01-17 (×41): 3 mL via INTRAVENOUS

## 2015-12-25 MED ORDER — DILTIAZEM HCL 100 MG IV SOLR
5.0000 mg/h | Freq: Once | INTRAVENOUS | Status: AC
  Administered 2015-12-25: 10 mg/h via INTRAVENOUS
  Filled 2015-12-25: qty 100

## 2015-12-25 MED ORDER — ONDANSETRON HCL 4 MG PO TABS: 4.0000 mg | ORAL_TABLET | Freq: Four times a day (QID) | ORAL | Status: DC | PRN

## 2015-12-25 MED ORDER — ACETAMINOPHEN 650 MG RE SUPP: 650.0000 mg | Freq: Four times a day (QID) | RECTAL | Status: DC | PRN

## 2015-12-25 MED ORDER — IPRATROPIUM-ALBUTEROL 0.5-2.5 (3) MG/3ML IN SOLN
3.0000 mL | Freq: Once | RESPIRATORY_TRACT | Status: AC
  Administered 2015-12-25: 3 mL via RESPIRATORY_TRACT
  Filled 2015-12-25: qty 3

## 2015-12-25 MED ORDER — INSULIN GLARGINE 100 UNIT/ML ~~LOC~~ SOLN
28.0000 [IU] | Freq: Every day | SUBCUTANEOUS | Status: DC
  Administered 2015-12-25: 28 [IU] via SUBCUTANEOUS
  Filled 2015-12-25: qty 0.28

## 2015-12-25 MED ORDER — INSULIN ASPART 100 UNIT/ML ~~LOC~~ SOLN: 0.0000 [IU] | Freq: Every day | SUBCUTANEOUS | Status: DC

## 2015-12-25 MED ORDER — DILTIAZEM LOAD VIA INFUSION
10.0000 mg | Freq: Once | INTRAVENOUS | Status: AC
  Administered 2015-12-25: 10 mg via INTRAVENOUS

## 2015-12-25 MED ORDER — SODIUM CHLORIDE 0.9 % IV SOLN
INTRAVENOUS | Status: DC
  Administered 2015-12-25 – 2015-12-26 (×2): via INTRAVENOUS

## 2015-12-25 MED ORDER — SODIUM CHLORIDE 0.9 % IV BOLUS (SEPSIS)
500.0000 mL | Freq: Once | INTRAVENOUS | Status: AC
  Administered 2015-12-25: 500 mL via INTRAVENOUS

## 2015-12-25 MED ORDER — SODIUM CHLORIDE 0.9 % IV SOLN
Freq: Once | INTRAVENOUS | Status: AC
  Administered 2015-12-25: 18:00:00 via INTRAVENOUS

## 2015-12-25 MED ORDER — INSULIN ASPART 100 UNIT/ML ~~LOC~~ SOLN
8.0000 [IU] | Freq: Once | SUBCUTANEOUS | Status: AC
  Administered 2015-12-25: 8 [IU] via SUBCUTANEOUS

## 2015-12-25 NOTE — Progress Notes (Signed)
   12/25/15 2030  BiPAP/CPAP/SIPAP  BiPAP/CPAP/SIPAP Pt Type Adult  Mask Type Full face mask  Mask Size Large  Set Rate 14 breaths/min  Respiratory Rate 36 breaths/min  IPAP 10 cmH20  EPAP 5 cmH2O  Oxygen Percent 30 %  Flow Rate 1 lpm  Minute Ventilation 23  Leak 33  Peak Inspiratory Pressure (PIP) 10  Tidal Volume (Vt) 711  BiPAP/CPAP/SIPAP BiPAP  Patient Home Equipment No  Auto Titrate No  BiPAP/CPAP /SiPAP Vitals  Pulse Rate (!) 128  Resp (!) 34  Patient transported to [place] from ED on BIPAP without any complications. No change made on bipap settings post ABG result.

## 2015-12-25 NOTE — ED Provider Notes (Signed)
CSN: [id number]     Arrival date & time 12/25/15  1139 History   First MD Initiated Contact with Patient 12/25/15 1143     Chief Complaint  Patient presents with  . Hip Pain     (Consider location/radiation/quality/duration/timing/severity/associated sxs/prior Treatment) HPI Comments: Patient with pain to right hip after rolling out of bed 2 days ago. Normally walks with a walker and cane. History of left hip replacement. Denies hitting head or losing consciousness. Daughter states he is more confused and has some generalized weakness more than usual. No fever. Has had some wheezing and coughing and shortness of breath. He does not have COPD or asthma. He is a former smoker. History of bypass graft and aortic valve replacement 2010. He is not anticoagulated. He has had some decreased PO intake and urine output.  The history is provided by the patient and a relative.    Past Medical History  Diagnosis Date  . Cerebrovascular disease, unspecified   . Coronary atherosclerosis of unspecified type of vessel, native or graft     s/p CABG 2010  . Esophageal reflux   . Pure hypercholesterolemia   . Essential hypertension, benign   . Type II or unspecified type diabetes mellitus with neurological manifestations, not stated as uncontrolled   . Bilateral hydrocele 3/11    [facility]  . History of aortic stenosis     s/p valve replacment 2010  . Cervicalgia   . Foraminal stenosis of cervical region     bilateral-C4-C5, C5-C6  . Colon polyp   . Shingles 07/27/2012   Past Surgical History  Procedure Laterality Date  . Cataract extraction  1990's    bilateral  . Cholecystectomy  1995  . Colonoscopy  1997    Mult. polyps- [facility]  . Sphincterectomy  09/20/03    for jaundice  . Ptca  12/99    with stent  . Coronary artery bypass graft  3/10    x3-using a left internal mammary artery to the left anterior descending coronary artery, saphenous vein graft to circumflex marginal branch,  spahenous vein graft  to posterior descendingcoronary artery. Endoscopic saphenous vein harvest from bilateral thighs was done.   . Aortic valve replacement  12/2008    with pericardial tissue valve  . Carotid US  10/2009    B ICA stenosis, stable disease, rec rpt 2 years  . Hydrocele excision      bilateral ([facility])  . L leg trauma  1957    truck over left leg  . Total hip arthroplasty Left 06/30/2014    Procedure: LEFT TOTAL HIP ARTHROPLASTY ANTERIOR APPROACH;  Surgeon: Mcarthur Rossetti, MD;  Location: [facility];  Service: Orthopedics;  Laterality: Left;   Family History  Problem Relation Age of Onset  . Heart attack Father 16  . Breast cancer Mother 41  . Brain cancer Sister   . Prostate cancer Brother   . Diabetes Neg Hx   . Stroke Neg Hx    Social History  Substance Use Topics  . Smoking status: Former Smoker -- 1.00 packs/day for 50 years    Types: Cigarettes    Quit date: 10/20/1993  . Smokeless tobacco: Never Used  . Alcohol Use: 0.0 oz/week    0 Standard drinks or equivalent per week     Comment: occasional    Review of Systems  Constitutional: Positive for activity change, appetite change and fatigue. Negative for fever.  HENT: Negative for congestion.   Eyes: Negative for visual  disturbance.  Respiratory: Positive for cough and shortness of breath.   Cardiovascular: Negative for chest pain.  Gastrointestinal: Negative for nausea, vomiting and abdominal pain.  Genitourinary: Negative for dysuria, urgency and hematuria.  Musculoskeletal: Negative for myalgias and arthralgias.  Skin: Negative for rash.  Neurological: Positive for weakness and light-headedness. Negative for dizziness, facial asymmetry and headaches.  A complete 10 system review of systems was obtained and all systems are negative except as noted in the HPI and PMH.      Allergies  Review of patient's allergies indicates no known allergies.  Home Medications   Prior to  Admission medications   Medication Sig Start Date End Date Taking? Authorizing Provider  acetaminophen (TYLENOL) 500 MG tablet Take 500-1,000 mg by mouth every 6 (six) hours as needed for mild pain.   Yes Historical Provider, MD  aspirin 81 MG tablet Take 81 mg by mouth daily.   Yes Historical Provider, MD  famotidine (PEPCID) 20 MG tablet TAKE 1 TABLET DAILY 06/01/15  Yes [name], MD  GLIPIZIDE XL 5 MG 24 hr tablet TAKE 1 TABLET DAILY WITH BREAKFAST 06/01/15  Yes [name], MD  LANTUS 100 UNIT/ML injection INJECT 28 UNITS UNDER THE SKIN AT BEDTIME 10/03/15  Yes [name], MD  metoprolol (LOPRESSOR) 50 MG tablet Take 1 tablet (50 mg total) by mouth 2 (two) times daily. 08/02/15  Yes Lelon Perla, MD  ramipril (ALTACE) 5 MG capsule Take 1 capsule (5 mg total) by mouth daily. 05/22/15  Yes Lelon Perla, MD  simvastatin (ZOCOR) 40 MG tablet Take 1 tablet (40 mg total) by mouth at bedtime. 04/02/15  Yes [name], MD  Syringe/Needle, Disp, 30G X 1/2" 1 ML MISC Use as directed to inject Lantus 05/08/15  Yes [name], MD  nitroGLYCERIN (NITROSTAT) 0.4 MG SL tablet Place 0.4 mg under the tongue every 5 (five) minutes as needed.      Historical Provider, MD   BP 102/62 mmHg  Pulse 131  Temp(Src) 98.6 F (37 C) (Oral)  Resp 28  Ht 5\' 9"  (1.753 m)  Wt 210 lb (95.255 kg)  BMI 31.00 kg/m2  SpO2 98% Physical Exam  Constitutional: He is oriented to person, place, and time. He appears well-developed and well-nourished. He appears distressed.  Increased work of breathing, tachypnea  HENT:  Head: Normocephalic and atraumatic.  Mouth/Throat: Oropharynx is clear and moist. No oropharyngeal exudate.  Dry mucous membranes  Eyes: Conjunctivae and EOM are normal. Pupils are equal, round, and reactive to light.  Neck: Normal range of motion. Neck supple.  No meningismus.  Cardiovascular: Normal rate, normal heart sounds and intact distal pulses.   No murmur  heard. Irregular tachycardic  Pulmonary/Chest: He is in respiratory distress. He has wheezes. He exhibits no tenderness.  Decreased breath sounds,expiratory wheezing throughout  Abdominal: Soft. There is no tenderness. There is no rebound and no guarding.  Musculoskeletal: Normal range of motion. He exhibits tenderness. He exhibits no edema.  Pain with ROM R hip. No shortening or external rotations. Able to range hips bilaterally  Neurological: He is alert and oriented to person, place, and time. No cranial nerve deficit. He exhibits normal muscle tone. Coordination normal.  No ataxia on finger to nose bilaterally. No pronator drift. 5/5 strength throughout. CN 2-12 intact.Equal grip strength. Sensation intact.   Skin: Skin is warm.  Psychiatric: He has a normal mood and affect. His behavior is normal.  Nursing note and vitals reviewed.   ED Course  Procedures (including critical care time) Labs Review Labs Reviewed  CBC WITH DIFFERENTIAL/PLATELET - Abnormal; Notable for the following:    WBC 13.9 (*)    RBC 3.59 (*)    Hemoglobin 10.7 (*)    HCT 32.6 (*)    Platelets 138 (*)    Neutro Abs 12.9 (*)    Lymphs Abs 0.5 (*)    All other components within normal limits  COMPREHENSIVE METABOLIC PANEL - Abnormal; Notable for the following:    Sodium 131 (*)    Chloride 97 (*)    Glucose, Bld 253 (*)    BUN 35 (*)    Creatinine, Ser 2.58 (*)    Calcium 8.8 (*)    Albumin 3.0 (*)    AST 54 (*)    Total Bilirubin 1.6 (*)    GFR calc non Af Amer 21 (*)    GFR calc Af Amer 24 (*)    All other components within normal limits  URINALYSIS, ROUTINE W REFLEX MICROSCOPIC (NOT AT [facility]) - Abnormal; Notable for the following:    Color, Urine AMBER (*)    APPearance CLOUDY (*)    Glucose, UA 100 (*)    Hgb urine dipstick LARGE (*)    Bilirubin Urine MODERATE (*)    Ketones, ur 15 (*)    Protein, ur 100 (*)    All other components within normal limits  URINE MICROSCOPIC-ADD ON - Abnormal;  Notable for the following:    Squamous Epithelial / LPF 0-5 (*)    Bacteria, UA RARE (*)    Casts HYALINE CASTS (*)    All other components within normal limits  I-STAT ARTERIAL BLOOD GAS, ED - Abnormal; Notable for the following:    pCO2 arterial 24.7 (*)    pO2, Arterial 183.0 (*)    Bicarbonate 15.4 (*)    Acid-base deficit 8.0 (*)    All other components within normal limits  INFLUENZA PANEL BY PCR (TYPE A & B, H1N1)  CREATININE, SERUM  HEMOGLOBIN A1C  TROPONIN I  TROPONIN I  TROPONIN I  PROCALCITONIN  CBC  BASIC METABOLIC PANEL    Imaging Review Dg Chest 1 View  12/25/2015  CLINICAL DATA:  Recent fall, wheezing, shortness of Breath EXAM: CHEST 1 VIEW COMPARISON:  07/02/2014 FINDINGS: Cardiomediastinal silhouette is stable. Status post CABG. No acute infiltrate or pleural effusion. No pulmonary edema. Atherosclerotic calcifications of thoracic aorta again noted. Mild degenerative changes lower thoracic spine. IMPRESSION: No active disease.  Status post CABG. Electronically Signed   By: Lahoma Crocker M.D.   On: 12/25/2015 12:42   Ct Head Wo Contrast  12/25/2015  CLINICAL DATA:  82 year old with several recent falls. Confusion and left hip pain. EXAM: CT HEAD WITHOUT CONTRAST TECHNIQUE: Contiguous axial images were obtained from the base of the skull through the vertex without intravenous contrast. COMPARISON:  None. FINDINGS: Brain: There is no evidence of acute intracranial hemorrhage, mass lesion, brain edema or extra-axial fluid collection. The ventricles and subarachnoid spaces are appropriately sized for age. There is no CT evidence of acute cortical infarction. There is low-density within the periventricular white matter, likely secondary to chronic small vessel ischemic changes. Intracranial vascular calcifications are present. Bones/sinuses/visualized face: The visualized paranasal sinuses, mastoid air cells and middle ears are clear. The calvarium is intact. IMPRESSION: No acute  intracranial findings. Age-appropriate atrophy and mild periventricular white matter disease, likely due to chronic small vessel ischemic changes. Electronically Signed   By: Caryl Comes.D.  On: 12/25/2015 14:04   Ct Pelvis Wo Contrast  12/25/2015  CLINICAL DATA:  Patient with several recent falls. Patient is confused. Reported left hip pain. EXAM: CT PELVIS WITHOUT CONTRAST TECHNIQUE: Multidetector CT imaging of the pelvis was performed following the standard protocol without intravenous contrast. COMPARISON:  06/30/2014 FINDINGS: There is no evidence of a fracture. Left total hip arthroplasty is well-seated and aligned. No evidence loosening. There is concentric hip joint space narrowing on the right. Minimal spurring is noted along the base of the femoral head. SI joints are normally spaced and aligned. There is a chronic bone defect along the superior left ilium consistent with a bone graft harvesting site. The bones are demineralized. No bone lesion. There are atherosclerotic calcifications along the aorta, iliac and femoral vessels. There are no pelvic masses or adenopathy. There are no abnormal fluid collections. Diverticula are noted along the visualize left colon. No diverticulitis. Visualized bowel is otherwise unremarkable. Surrounding soft tissues are unremarkable.  No hip joint effusion. IMPRESSION: 1. No acute finding. No evidence of a fracture for of loosening of the left hip total arthroplasty. Electronically Signed   By: Lajean Manes M.D.   On: 12/25/2015 14:07   Dg Chest Portable 1 View  12/25/2015  CLINICAL DATA:  Sudden on set of expiratory wheezing post CT scan. Pt is here for A-fib, SOB, and generalized weakness EXAM: PORTABLE CHEST 1 VIEW COMPARISON:  12/25/2015 at 12:04 p.m. FINDINGS: Changes from cardiac surgery and valve replacement are stable. No mediastinal or hilar masses or evidence of adenopathy. Clear lungs.  No pleural effusion or pneumothorax. Bony thorax is grossly  intact. IMPRESSION: No acute cardiopulmonary disease. Electronically Signed   By: Lajean Manes M.D.   On: 12/25/2015 17:23   Dg Hip Unilat  With Pelvis 2-3 Views Right  12/25/2015  CLINICAL DATA:  Right hip pain since falling 4 days ago. EXAM: DG HIP (WITH OR WITHOUT PELVIS) 2-3V RIGHT COMPARISON:  One-view pelvis 06/30/2014. FINDINGS: The bones appear mildly demineralized. There is no evidence of acute fracture or dislocation. Patient is status post left total hip arthroplasty. The visualized hardware appears unchanged. There are stable mild degenerative changes at the right hip and sacroiliac joints. Vascular clips are present in both groins. IMPRESSION: No evidence of acute right hip fracture or dislocation. Electronically Signed   By: Richardean Sale M.D.   On: 12/25/2015 12:40   I have personally reviewed and evaluated these images and lab results as part of my medical decision-making.   EKG Interpretation   Date/Time:  Tuesday December 25 2015 16:44:59 EST Ventricular Rate:  153 PR Interval:  200 QRS Duration: 100 QT Interval:  296 QTC Calculation: 472 R Axis:   -78 Text Interpretation:  Atrial fibrillation with rapid V-rate Left anterior  fascicular block Abnormal R-wave progression, late transition atrial  fibrillation with RVR Confirmed by Wyvonnia Dusky  MD, Aj Crunkleton [phone]) on  12/25/2015 4:57:19 PM      MDM   Final diagnoses:  Bronchitis  Dehydration  Acute renal failure, unspecified acute renal failure type ([place])  R hip pain since falling out of bed 2 nights ago.  Did not hit head.  Generalized weakness.  Wheezing and tachypneic on exam.  No history of COPD or asthma.  Nebs, steroids, Xrays.  Hip imaging negative.  No infiltrate on CXR. Intermittent A FIB on EKG. Hx of same per family.  Not on blood thinners. Will need admission for likely COPD exacerbation and AKI.  Hydrate slowly.  Patient now atrial fibrillation with RVR and elevated BP. Cardizem gtt, bolus,placed on bipap  for increased work of breathing. Repeat CXR shows no edema. No CO2 retention on ABG.  DNR/DNI status confirmed with patient and daughter. Admission changed to step down.  Dr. Marily Memos updated.  CRITICAL CARE Performed by: Ezequiel Essex Total critical care time: 60minutes Critical care time was exclusive of separately billable procedures and treating other patients. Critical care was necessary to treat or prevent imminent or life-threatening deterioration. Critical care was time spent personally by me on the following activities: development of treatment plan with patient and/or surrogate as well as nursing, discussions with consultants, evaluation of patient's response to treatment, examination of patient, obtaining history from patient or surrogate, ordering and performing treatments and interventions, ordering and review of laboratory studies, ordering and review of radiographic studies, pulse oximetry and re-evaluation of patient's condition.   Ezequiel Essex, MD 12/25/15 [phone]

## 2015-12-25 NOTE — ED Notes (Signed)
Pt here from home with c/o right hip following rolling  Out of bed this past sat. Pt has had some general weakness also

## 2015-12-25 NOTE — Telephone Encounter (Signed)
PLEASE NOTE: All timestamps contained within this report are represented as [place] Standard Time. CONFIDENTIALTY NOTICE: This fax transmission is intended only for the addressee. It contains information that is legally privileged, confidential or otherwise protected from use or disclosure. If you are not the intended recipient, you are strictly prohibited from reviewing, disclosing, copying using or disseminating any of this information or taking any action in reliance on or regarding this information. If you have received this fax in error, please notify us immediately by telephone so that we can arrange for its return to [place]. Phone: [phone], Toll-Free: [phone], Fax: [phone] Page: 1 of 2 Call Id: [id number] [place] Patient Name: Eric [name] Gender: Male DOB: [birth year] Age: 82 Y 26 D Return Phone Number: [phone] (Primary) Address: City/State/Zip:  Client [facility] - Client Client Site [place] - Day Physician Renford Dills Contact Type Call Who Is Calling Patient / Member / Family / Caregiver Call Type Triage / Clinical Caller Name Corky Mull Relationship To Patient Daughter Return Phone Number [phone] (Primary) Chief Complaint Weakness, Generalized Reason for Call Symptomatic / Request for [place] says that her father has weakness; not walking well since yesterday, and he is not helping himself out of bed as well. His legs and upper body are very weak Appointment Disposition EMR Patient Refused Appointment Info pasted into Epic Yes PreDisposition Go to ED Translation No Nurse Assessment Nurse: Laurena Bering, RN, Helene Kelp Date/Time Eilene Ghazi Time): 12/24/2015 4:59:33 PM Confirm and document reason for call. If symptomatic, describe symptoms. You must click the next button to save  text entered. ---Caller states that her father feels real weak and tired. Decrease in walking. Uses walker. Has the patient traveled out of the country within the last 30 days? ---No Does the patient have any new or worsening symptoms? ---Yes Will a triage be completed? ---Yes Related visit to physician within the last 2 weeks? ---No Does the PT have any chronic conditions? (i.e. diabetes, asthma, etc.) ---Yes List chronic conditions. ---CABG, diabetes on insulin Is this a behavioral health or substance abuse call? ---No Guidelines Guideline Title Affirmed Question Affirmed Notes Nurse Date/Time (Eastern Time) Weakness (Generalized) and Fatigue [1] SEVERE weakness (i.e., unable to walk or barely able to walk, requires support) Laurena Bering, RN, Helene Kelp 12/24/2015 5:02:06 PM PLEASE NOTE: All timestamps contained within this report are represented as [place] Standard Time. CONFIDENTIALTY NOTICE: This fax transmission is intended only for the addressee. It contains information that is legally privileged, confidential or otherwise protected from use or disclosure. If you are not the intended recipient, you are strictly prohibited from reviewing, disclosing, copying using or disseminating any of this information or taking any action in reliance on or regarding this information. If you have received this fax in error, please notify us immediately by telephone so that we can arrange for its return to [place]. Phone: [phone], Toll-Free: [phone], Fax: [phone] Page: 2 of 2 Call Id: [id number] Guidelines Guideline Title Affirmed Question Affirmed Notes Nurse Date/Time Eilene Ghazi Time) AND [2] new onset or worsening Disp. Time Eilene Ghazi Time) Disposition Final User 12/24/2015 5:05:28 PM Call EMS 911 Now Laurena Bering, RN, Helene Kelp 12/24/2015 5:06:05 PM Send To RN Personal Laurena Bering, RN, Helene Kelp 12/24/2015 5:14:19 PM 911 Outcome Documentation Yes Laurena Bering, RN, Helene Kelp Reason: Refused to go to ER. Daughter is going  to wait about 2 hours and see if her father improves. If  no improvement will call 911 Caller Understands: Yes Disagree/Comply: Comply Care Advice Given Per Guideline CALL EMS 911 NOW: Immediate medical attention is needed. You need to hang up and call 911 (or an ambulance). Psychologist, forensic Discretion: I'll call you back in a few minutes to be sure you were able to reach them.) BRING MEDICINES: * Please bring a list of your current medicines when you go to the Emergency Department (ER). * It is also a good idea to bring the pill bottles too. This will help the doctor to make certain you are taking the right medicines and the right dose. CARE ADVICE given per Weakness and Fatigue (Adult) guideline. Referrals GO TO FACILITY UNDECIDED

## 2015-12-25 NOTE — Progress Notes (Signed)
Pharmacy Antibiotic Note  Eric [name] is a 82 y.o. male admitted on 12/25/2015 with COPD exacerbation.  Pharmacy has been consulted for levofloxacin dosing.  Plan: Levofloxacin 750 mg x 1 then 500 mg q48h  Height: 5\' 9"  (175.3 cm) Weight: 210 lb (95.255 kg) IBW/kg (Calculated) : 70.7  Temp (24hrs), Avg:98.6 F (37 C), Min:98.6 F (37 C), Max:98.6 F (37 C)   Recent Labs Lab 12/25/15 1226  WBC 13.9*  CREATININE 2.58*    Estimated Creatinine Clearance: 23 mL/min (by C-G formula based on Cr of 2.58).    No Known Allergies  Antimicrobials this admission: Levofloxacin 3/7>>  Microbiology results: Flu neg  Levester Fresh, PharmD, BCPS, [facility] Clinical Pharmacist Pager [phone] 12/25/2015 5:56 PM

## 2015-12-25 NOTE — ED Notes (Signed)
MD Aware of Patient Afib RVR verbal give for Cardizem drip and 10mg  Bolus. MD also advised of patients BP.

## 2015-12-25 NOTE — Consult Note (Signed)
Reason for Consult: a fib with RVR   Referring Physician: Dr. Marily Memos   PCP:  [name], MD  Primary Cardiologist:Dr. Michaelangelo Mittelman is an 82 y.o. male.    Chief Complaint: admitted today with SOB, weakness  And acute respiratory failure. Also a fib with RVR  HPI: 82 year old male with hx showed three-vessel coronary disease and severe aortic stenosis. Undergoing on December 22 2008, underwent aortic valve replacement with #25-mm [facility] Ease pericardial tissue valve as well as coronary artery bypassing graft with a LIMA to the LAD, saphenous vein graft to the circ marginal and the saphenous vein graft to the PDA. Echo March 2014 showed normal LV function, prosthetic aortic valve with mean grad 15 mmHg, biatrial enlargement and mild MR. Carotid Dopplers in May 2015 showed a 60-79% right and 1-39% left stenosis.    Had been doing well until Friday and fell out of bed Friday and did not get help to get up until Sat.  Now over 2 days with increased wheezing and coughing.  No edema, no chest pain, + coughing and wheezes, no fever.  He states he has not eaten since Friday.  Came to ER and found to be tachycardic has P waves and is irregular possible MAT?  A flutter--He has been treated with nebs with improvement of resp. Failure and tightness.  His HR continued to climb to 135 he was given Cardizem 20 mg with BP lower to 102.  Given fluid bolus, and dilt drip at 5 mg.      Currently resting fairly well though HR elevated.   Cr. 2.58, K+ 4.2 Na 131, glucose 253, WBC 13.9, H/H 10.7/32.6 plts 138- flu screen neg. U/a with hematuria  CXR without HF or PNA, clear CT head no acute findings CT pelvis no acute findings  troponins not yet back  Past Medical History  Diagnosis Date  . Cerebrovascular disease, unspecified   . Coronary atherosclerosis of unspecified type of vessel, native or graft     s/p CABG 2010  . Esophageal reflux   . Pure hypercholesterolemia    . Essential hypertension, benign   . Type II or unspecified type diabetes mellitus with neurological manifestations, not stated as uncontrolled   . Bilateral hydrocele 3/11    [facility]  . History of aortic stenosis     s/p valve replacment 2010  . Cervicalgia   . Foraminal stenosis of cervical region     bilateral-C4-C5, C5-C6  . Colon polyp   . Shingles 07/27/2012    Past Surgical History  Procedure Laterality Date  . Cataract extraction  1990's    bilateral  . Cholecystectomy  1995  . Colonoscopy  1997    Mult. polyps- [facility]  . Sphincterectomy  09/20/03    for jaundice  . Ptca  12/99    with stent  . Coronary artery bypass graft  3/10    x3-using a left internal mammary artery to the left anterior descending coronary artery, saphenous vein graft to circumflex marginal branch, spahenous vein graft  to posterior descendingcoronary artery. Endoscopic saphenous vein harvest from bilateral thighs was done.   . Aortic valve replacement  12/2008    with pericardial tissue valve  . Carotid US  10/2009    B ICA stenosis, stable disease, rec rpt 2 years  . Hydrocele excision      bilateral ([facility])  . L leg trauma  1957  truck over left leg  . Total hip arthroplasty Left 06/30/2014    Procedure: LEFT TOTAL HIP ARTHROPLASTY ANTERIOR APPROACH;  Surgeon: Mcarthur Rossetti, MD;  Location: [facility];  Service: Orthopedics;  Laterality: Left;    Family History  Problem Relation Age of Onset  . Heart attack Father 27  . Breast cancer Mother 58  . Brain cancer Sister   . Prostate cancer Brother   . Diabetes Neg Hx   . Stroke Neg Hx    Social History:  reports that he quit smoking about 22 years ago. His smoking use included Cigarettes. He has a 50 pack-year smoking history. He has never used smokeless tobacco. He reports that he drinks alcohol. He reports that he does not use illicit drugs.  Allergies: No Known Allergies  OUTPATIENT MEDICATIONS: No current  facility-administered medications on file prior to encounter.   Current Outpatient Prescriptions on File Prior to Encounter  Medication Sig Dispense Refill  . aspirin 81 MG tablet Take 81 mg by mouth daily.    . famotidine (PEPCID) 20 MG tablet TAKE 1 TABLET DAILY 90 tablet 3  . GLIPIZIDE XL 5 MG 24 hr tablet TAKE 1 TABLET DAILY WITH BREAKFAST 90 tablet 3  . LANTUS 100 UNIT/ML injection INJECT 28 UNITS UNDER THE SKIN AT BEDTIME 30 mL 11  . metoprolol (LOPRESSOR) 50 MG tablet Take 1 tablet (50 mg total) by mouth 2 (two) times daily. 180 tablet 2  . ramipril (ALTACE) 5 MG capsule Take 1 capsule (5 mg total) by mouth daily. 90 capsule 4  . simvastatin (ZOCOR) 40 MG tablet Take 1 tablet (40 mg total) by mouth at bedtime. 90 tablet 3  . Syringe/Needle, Disp, 30G X 1/2" 1 ML MISC Use as directed to inject Lantus 90 each 1  . nitroGLYCERIN (NITROSTAT) 0.4 MG SL tablet Place 0.4 mg under the tongue every 5 (five) minutes as needed.        Results for orders placed or performed during the hospital encounter of 12/25/15 (from the past 48 hour(s))  CBC with Differential/Platelet     Status: Abnormal   Collection Time: 12/25/15 12:26 PM  Result Value Ref Range   WBC 13.9 (H) 4.0 - 10.5 K/uL   RBC 3.59 (L) 4.22 - 5.81 MIL/uL   Hemoglobin 10.7 (L) 13.0 - 17.0 g/dL   HCT 32.6 (L) 39.0 - 52.0 %   MCV 90.8 78.0 - 100.0 fL   MCH 29.8 26.0 - 34.0 pg   MCHC 32.8 30.0 - 36.0 g/dL   RDW 13.8 11.5 - 15.5 %   Platelets 138 (L) 150 - 400 K/uL   Neutrophils Relative % 93 %   Neutro Abs 12.9 (H) 1.7 - 7.7 K/uL   Lymphocytes Relative 4 %   Lymphs Abs 0.5 (L) 0.7 - 4.0 K/uL   Monocytes Relative 3 %   Monocytes Absolute 0.5 0.1 - 1.0 K/uL   Eosinophils Relative 0 %   Eosinophils Absolute 0.0 0.0 - 0.7 K/uL   Basophils Relative 0 %   Basophils Absolute 0.0 0.0 - 0.1 K/uL  Comprehensive metabolic panel     Status: Abnormal   Collection Time: 12/25/15 12:26 PM  Result Value Ref Range   Sodium 131 (L) 135 -  145 mmol/L   Potassium 4.2 3.5 - 5.1 mmol/L   Chloride 97 (L) 101 - 111 mmol/L   CO2 22 22 - 32 mmol/L   Glucose, Bld 253 (H) 65 - 99 mg/dL   BUN 35 (H)  6 - 20 mg/dL   Creatinine, Ser 2.58 (H) 0.61 - 1.24 mg/dL   Calcium 8.8 (L) 8.9 - 10.3 mg/dL   Total Protein 6.8 6.5 - 8.1 g/dL   Albumin 3.0 (L) 3.5 - 5.0 g/dL   AST 54 (H) 15 - 41 U/L   ALT 29 17 - 63 U/L   Alkaline Phosphatase 60 38 - 126 U/L   Total Bilirubin 1.6 (H) 0.3 - 1.2 mg/dL   GFR calc non Af Amer 21 (L) >60 mL/min   GFR calc Af Amer 24 (L) >60 mL/min    Comment: (NOTE) The eGFR has been calculated using the CKD EPI equation. This calculation has not been validated in all clinical situations. eGFR's persistently <60 mL/min signify possible Chronic Kidney Disease.    Anion gap 12 5 - 15  Urinalysis, Routine w reflex microscopic (not at [facility])     Status: Abnormal   Collection Time: 12/25/15  3:00 PM  Result Value Ref Range   Color, Urine AMBER (A) YELLOW    Comment: BIOCHEMICALS MAY BE AFFECTED BY COLOR   APPearance CLOUDY (A) CLEAR   Specific Gravity, Urine 1.021 1.005 - 1.030   pH 5.5 5.0 - 8.0   Glucose, UA 100 (A) NEGATIVE mg/dL   Hgb urine dipstick LARGE (A) NEGATIVE   Bilirubin Urine MODERATE (A) NEGATIVE   Ketones, ur 15 (A) NEGATIVE mg/dL   Protein, ur 100 (A) NEGATIVE mg/dL   Nitrite NEGATIVE NEGATIVE   Leukocytes, UA NEGATIVE NEGATIVE  Urine microscopic-add on     Status: Abnormal   Collection Time: 12/25/15  3:00 PM  Result Value Ref Range   Squamous Epithelial / LPF 0-5 (A) NONE SEEN   WBC, UA 0-5 0 - 5 WBC/hpf   RBC / HPF 0-5 0 - 5 RBC/hpf   Bacteria, UA RARE (A) NONE SEEN   Casts HYALINE CASTS (A) NEGATIVE    Comment: GRANULAR CAST  Influenza panel by PCR (type A & B, H1N1)     Status: None   Collection Time: 12/25/15  3:28 PM  Result Value Ref Range   Influenza A By PCR NEGATIVE NEGATIVE   Influenza B By PCR NEGATIVE NEGATIVE   H1N1 flu by pcr NOT DETECTED NOT DETECTED    Comment:          The Xpert Flu assay (FDA approved for nasal aspirates or washes and nasopharyngeal swab specimens), is intended as an aid in the diagnosis of influenza and should not be used as a sole basis for treatment.   I-Stat arterial blood gas, ED     Status: Abnormal   Collection Time: 12/25/15  5:23 PM  Result Value Ref Range   pH, Arterial 7.403 7.350 - 7.450   pCO2 arterial 24.7 (L) 35.0 - 45.0 mmHg   pO2, Arterial 183.0 (H) 80.0 - 100.0 mmHg   Bicarbonate 15.4 (L) 20.0 - 24.0 mEq/L   TCO2 16 0 - 100 mmol/L   O2 Saturation 100.0 %   Acid-base deficit 8.0 (H) 0.0 - 2.0 mmol/L   Patient temperature HIDE    Sample type ARTERIAL    Dg Chest 1 View  12/25/2015  CLINICAL DATA:  Recent fall, wheezing, shortness of Breath EXAM: CHEST 1 VIEW COMPARISON:  07/02/2014 FINDINGS: Cardiomediastinal silhouette is stable. Status post CABG. No acute infiltrate or pleural effusion. No pulmonary edema. Atherosclerotic calcifications of thoracic aorta again noted. Mild degenerative changes lower thoracic spine. IMPRESSION: No active disease.  Status post CABG. Electronically Signed  By: Lahoma Crocker M.D.   On: 12/25/2015 12:42   Ct Head Wo Contrast  12/25/2015  CLINICAL DATA:  82 year old with several recent falls. Confusion and left hip pain. EXAM: CT HEAD WITHOUT CONTRAST TECHNIQUE: Contiguous axial images were obtained from the base of the skull through the vertex without intravenous contrast. COMPARISON:  None. FINDINGS: Brain: There is no evidence of acute intracranial hemorrhage, mass lesion, brain edema or extra-axial fluid collection. The ventricles and subarachnoid spaces are appropriately sized for age. There is no CT evidence of acute cortical infarction. There is low-density within the periventricular white matter, likely secondary to chronic small vessel ischemic changes. Intracranial vascular calcifications are present. Bones/sinuses/visualized face: The visualized paranasal sinuses, mastoid air cells and  middle ears are clear. The calvarium is intact. IMPRESSION: No acute intracranial findings. Age-appropriate atrophy and mild periventricular white matter disease, likely due to chronic small vessel ischemic changes. Electronically Signed   By: Richardean Sale M.D.   On: 12/25/2015 14:04   Ct Pelvis Wo Contrast  12/25/2015  CLINICAL DATA:  Patient with several recent falls. Patient is confused. Reported left hip pain. EXAM: CT PELVIS WITHOUT CONTRAST TECHNIQUE: Multidetector CT imaging of the pelvis was performed following the standard protocol without intravenous contrast. COMPARISON:  06/30/2014 FINDINGS: There is no evidence of a fracture. Left total hip arthroplasty is well-seated and aligned. No evidence loosening. There is concentric hip joint space narrowing on the right. Minimal spurring is noted along the base of the femoral head. SI joints are normally spaced and aligned. There is a chronic bone defect along the superior left ilium consistent with a bone graft harvesting site. The bones are demineralized. No bone lesion. There are atherosclerotic calcifications along the aorta, iliac and femoral vessels. There are no pelvic masses or adenopathy. There are no abnormal fluid collections. Diverticula are noted along the visualize left colon. No diverticulitis. Visualized bowel is otherwise unremarkable. Surrounding soft tissues are unremarkable.  No hip joint effusion. IMPRESSION: 1. No acute finding. No evidence of a fracture for of loosening of the left hip total arthroplasty. Electronically Signed   By: Lajean Manes M.D.   On: 12/25/2015 14:07   Dg Chest Portable 1 View  12/25/2015  CLINICAL DATA:  Sudden on set of expiratory wheezing post CT scan. Pt is here for A-fib, SOB, and generalized weakness EXAM: PORTABLE CHEST 1 VIEW COMPARISON:  12/25/2015 at 12:04 p.m. FINDINGS: Changes from cardiac surgery and valve replacement are stable. No mediastinal or hilar masses or evidence of adenopathy. Clear  lungs.  No pleural effusion or pneumothorax. Bony thorax is grossly intact. IMPRESSION: No acute cardiopulmonary disease. Electronically Signed   By: Lajean Manes M.D.   On: 12/25/2015 17:23   Dg Hip Unilat  With Pelvis 2-3 Views Right  12/25/2015  CLINICAL DATA:  Right hip pain since falling 4 days ago. EXAM: DG HIP (WITH OR WITHOUT PELVIS) 2-3V RIGHT COMPARISON:  One-view pelvis 06/30/2014. FINDINGS: The bones appear mildly demineralized. There is no evidence of acute fracture or dislocation. Patient is status post left total hip arthroplasty. The visualized hardware appears unchanged. There are stable mild degenerative changes at the right hip and sacroiliac joints. Vascular clips are present in both groins. IMPRESSION: No evidence of acute right hip fracture or dislocation. Electronically Signed   By: Richardean Sale M.D.   On: 12/25/2015 12:40    ROS: General:+ colds no fevers,  But + SOB and wheezes no weight changes, + weakness Skin: rash type abrasion  on rt knee to calf from fall or ulcers HEENT:no blurred vision, no congestion CV:see HPI PUL:see HPI GI:no diarrhea constipation or melena, no indigestion GU:no hematuria, no dysuria MS:no joint pain, no claudication Neuro:no syncope, no lightheadedness Endo:+ diabetes, no thyroid disease   Blood pressure 102/62, pulse 131, temperature 98.6 F (37 C), temperature source Oral, resp. rate 28, height 5' 9"  (1.753 m), weight 210 lb (95.255 kg), SpO2 98 %.  Wt Readings from Last 3 Encounters:  12/25/15 210 lb (95.255 kg)  04/24/15 216 lb (97.977 kg)  03/29/15 214 lb 8 oz (97.297 kg)    PE: General:Pleasant affect, NAD on BiPap  Skin:Warm and dry, brisk capillary refill, rash abrasion rt lower leg HEENT:normocephalic, sclera clear, mucus membranes moist Neck:supple, no JVD, no bruits  Heart:S1S2irreg irreg  Without obvious murmur difficult to hear with BIPAP no gallup, rub or click Lungs: without rales, scattered rhonchi, occ  wheezes OIN:OMVEH, soft, non tender, + BS, do not palpate liver spleen or masses Ext:no lower ext edema, 2+ pedal pulses, 2+ radial pulses Neuro:alert and oriented X 3, MAE, follows commands, + facial symmetry    Assessment/Plan Active Problems:   HYPERCHOLESTEROLEMIA   HYPERTENSION, BENIGN SYSTEMIC   Bronchitis   Acute respiratory distress ([name]   Diabetes mellitus with complication ([name]   AKI (acute kidney injury) ([name]   PAF (paroxysmal atrial fibrillation) ([name]   S/P CABG (coronary artery bypass graft)   S/P AVR (aortic valve replacement)   Atrial fibrillation with RVR ([name]  Irregular HR A flutter vs. MAT- at times P waves are present.  Agree with Dilt to keep HR < 130  CHA2DS2VASc score 8 but not anticoagulation candidate  CAD with CABG no chest pain, troponins pending  Hx AVR #25-mm [facility] Ease pericardial tissue valve will check echo.  Pt is DNR      Isaiah Serge  Nurse Practitioner Certified [place] Pager [phone] or after 5pm or weekends call [phone] 12/25/2015, 6:01 PM As above, patient seen and examined. Briefly he is an 82 year old male with past medical history of aortic valve replacement, coronary artery disease status post coronary artery bypass graft, renal insufficiency, Hypertension, hyperlipidemia for evaluation of atrial flutter versus multifocal atrial tachycardia. Patient fell out of his bed recently and was unable to get up. He also describes some upper respiratory symptoms. He presented to the emergency room for evaluation of hip pain. He was dyspneic and is now on BiPAP. He was felt to possibly be in atrial fibrillation and cardiology asked to evaluate. He does have some dyspnea but denies chest pain or palpitations. Initial electrocardiogram with probable sinus tach and nonconducted PACs. Follow-up electrocardiogram with question atrial flutter versus MAT. 1 atrial flutter versus MAT-Will treat with IV Cardizem.  Hopefully his heart rate will improve with hydration and treatment of his respiratory issues. CHADSvasc 5. However he has fallen twice recently and appears to be somewhat debilitated. I would be hesitant to anticoagulate at this point. Would repeat echocardiogram to assess LV function. 2 acute on chronic kidney disease-patient apparently has not been eating well. There may be a component of dehydration. Agree with gentle hydration. 3 COPD/URI-management per Primary service. Agree with bronchodilators, antibiotics and steroids. 4 coronary artery disease-continue aspirin and statin. 5 history of hypertension-would hold metoprolol and beta blocker as BP is borderline on cardizem.  6 Prior AVR

## 2015-12-25 NOTE — ED Notes (Signed)
CHECKED CBG 300, RN JANEE INFORMED

## 2015-12-25 NOTE — Telephone Encounter (Signed)
currently at [facility].

## 2015-12-25 NOTE — Telephone Encounter (Signed)
I spoke with Eric [name] [facility] signed) and pt did not go to ED. Pt is breathing harder than normal; upper body weakness; Eric [name] is going to ck on pt this morning at 10 AM. Pt is dead weight and Eric [name] will call 911 to take pt to ED whether pt wants to go or not. Eric [name] is at work and busy and cannot call pt now; asked that I call pt and call her back in 10 mins. Do not have contact # for pt in pts chart; Eric [name] called and spoke with pt; Pt agrees feels horrible and Eric [name] is on the way to pts home now; Eric [name] is going to call 911 and take pt to [facility] ED.

## 2015-12-25 NOTE — H&P (Addendum)
[facility] History and Physical  Eric [name] [id number] DOB: Oct 13, 1927 DOA: 12/25/2015  Referring physician: Dr. Wyvonnia Dusky - [facility] PCP: [name], MD   Chief Complaint: Generalized weakness and wheezing and hip pain.  HPI: Eric [name] is a 82 y.o. male  Patient reports 2 day history of generalized weakness which resulted in patient falling out of bed 2 days ago. Developed hip pain from this fall which persisted into today. History of hip fracture and surgical repair. Over the last 2 days patient has had new onset wheezing and coughing with occasional phlegm production. Denies any fevers, chest pain, palpitations, LOC, nausea, vomiting. Patient does endorse runny nose and upper respiratory congestion. Very poor appetite during this period time. Very little oral intake is pushing fluids. Patient states that he smoked for 50 years approximately one pack per day but quit approximately 20 years ago. With make symptoms better. Worsened with exertion.   Review of Systems:  Constitutional:  No weight loss, night sweats, Fevers, chills, HEENT:  No headaches, Difficulty swallowing,Tooth/dental problems,Sore throat, Cardio-vascular:  No chest pain, Orthopnea, PND, swelling in lower extremities, anasarca, dizziness, palpitations  GI:  No heartburn, indigestion, abdominal pain, nausea, vomiting, diarrhea, change in bowel habits, loss of appetite  Resp: P{er HPI Skin:  no rash or lesions.  GU:  no dysuria, change in color of urine, no urgency or frequency. No flank pain.  Musculoskeletal:   No joint pain or swelling. No decreased range of motion. No back pain.  Psych:  No change in mood or affect. No depression or anxiety. No memory loss.  Neuro:  No change in sensation, unilateral strength, or cognitive abilities  All other systems were reviewed and are negative.  Past Medical History  Diagnosis Date  . Cerebrovascular disease, unspecified   . Coronary  atherosclerosis of unspecified type of vessel, native or graft     s/p CABG 2010  . Esophageal reflux   . Pure hypercholesterolemia   . Essential hypertension, benign   . Type II or unspecified type diabetes mellitus with neurological manifestations, not stated as uncontrolled   . Bilateral hydrocele 3/11    [facility]  . History of aortic stenosis     s/p valve replacment 2010  . Cervicalgia   . Foraminal stenosis of cervical region     bilateral-C4-C5, C5-C6  . Colon polyp   . Shingles 07/27/2012   Past Surgical History  Procedure Laterality Date  . Cataract extraction  1990's    bilateral  . Cholecystectomy  1995  . Colonoscopy  1997    Mult. polyps- [facility]  . Sphincterectomy  09/20/03    for jaundice  . Ptca  12/99    with stent  . Coronary artery bypass graft  3/10    x3-using a left internal mammary artery to the left anterior descending coronary artery, saphenous vein graft to circumflex marginal branch, spahenous vein graft  to posterior descendingcoronary artery. Endoscopic saphenous vein harvest from bilateral thighs was done.   . Aortic valve replacement  12/2008    with pericardial tissue valve  . Carotid US  10/2009    B ICA stenosis, stable disease, rec rpt 2 years  . Hydrocele excision      bilateral ([facility])  . L leg trauma  1957    truck over left leg  . Total hip arthroplasty Left 06/30/2014    Procedure: LEFT TOTAL HIP ARTHROPLASTY ANTERIOR APPROACH;  Surgeon: Mcarthur Rossetti, MD;  Location: [facility];  Service: Orthopedics;  Laterality: Left;   Social History:  reports that he quit smoking about 22 years ago. His smoking use included Cigarettes. He has a 50 pack-year smoking history. He has never used smokeless tobacco. He reports that he drinks alcohol. He reports that he does not use illicit drugs.  No Known Allergies  Family History  Problem Relation Age of Onset  . Heart attack Father 84  . Breast cancer Mother 82  . Brain cancer  Sister   . Prostate cancer Brother   . Diabetes Neg Hx   . Stroke Neg Hx      Prior to Admission medications   Medication Sig Start Date End Date Taking? Authorizing Provider  acetaminophen (TYLENOL) 500 MG tablet Take 500-1,000 mg by mouth every 6 (six) hours as needed for mild pain.   Yes Historical Provider, MD  aspirin 81 MG tablet Take 81 mg by mouth daily.   Yes Historical Provider, MD  famotidine (PEPCID) 20 MG tablet TAKE 1 TABLET DAILY 06/01/15  Yes [name], MD  GLIPIZIDE XL 5 MG 24 hr tablet TAKE 1 TABLET DAILY WITH BREAKFAST 06/01/15  Yes [name], MD  LANTUS 100 UNIT/ML injection INJECT 28 UNITS UNDER THE SKIN AT BEDTIME 10/03/15  Yes [name], MD  metoprolol (LOPRESSOR) 50 MG tablet Take 1 tablet (50 mg total) by mouth 2 (two) times daily. 08/02/15  Yes Lelon Perla, MD  ramipril (ALTACE) 5 MG capsule Take 1 capsule (5 mg total) by mouth daily. 05/22/15  Yes Lelon Perla, MD  simvastatin (ZOCOR) 40 MG tablet Take 1 tablet (40 mg total) by mouth at bedtime. 04/02/15  Yes [name], MD  Syringe/Needle, Disp, 30G X 1/2" 1 ML MISC Use as directed to inject Lantus 05/08/15  Yes [name], MD  nitroGLYCERIN (NITROSTAT) 0.4 MG SL tablet Place 0.4 mg under the tongue every 5 (five) minutes as needed.      Historical Provider, MD   Physical Exam: Filed Vitals:   12/25/15 1515 12/25/15 1530 12/25/15 1600 12/25/15 1602  BP: 110/56 110/73  132/118  Pulse: 112  118 112  Temp:      TempSrc:      Resp: 27 23 25  32  SpO2: 99%  99% 95%    Wt Readings from Last 3 Encounters:  04/24/15 97.977 kg (216 lb)  03/29/15 97.297 kg (214 lb 8 oz)  03/05/15 98.204 kg (216 lb 8 oz)    General: Mild distress. Resting in bed Eyes:  PERRL, EOMI, normal lids, iris ENT:  Dry mm Neck:  no LAD, masses or thyromegaly Cardiovascular:  RRR, no m/r/g. No LE edema.  Respiratory: Diffuse wheezing and rhonchi with increased effort, including retractions and belly  breathing. On 2 L nasal cannula. Abdomen:  soft, ntnd Skin:  no rash or induration seen on limited exam Musculoskeletal:  grossly normal tone BUE/BLE Psychiatric:  grossly normal mood and affect, speech fluent and appropriate Neurologic:  CN 2-12 grossly intact, moves all extremities in coordinated fashion.          Labs on Admission:  Basic Metabolic Panel:  Recent Labs Lab 12/25/15 1226  NA 131*  K 4.2  CL 97*  CO2 22  GLUCOSE 253*  BUN 35*  CREATININE 2.58*  CALCIUM 8.8*   Liver Function Tests:  Recent Labs Lab 12/25/15 1226  AST 54*  ALT 29  ALKPHOS 60  BILITOT 1.6*  PROT 6.8  ALBUMIN 3.0*   No results for input(s): LIPASE, AMYLASE  in the last 168 hours. No results for input(s): AMMONIA in the last 168 hours. CBC:  Recent Labs Lab 12/25/15 1226  WBC 13.9*  NEUTROABS 12.9*  HGB 10.7*  HCT 32.6*  MCV 90.8  PLT 138*   Cardiac Enzymes: No results for input(s): CKTOTAL, CKMB, CKMBINDEX, TROPONINI in the last 168 hours.  BNP (last 3 results) No results for input(s): BNP in the last 8760 hours.  ProBNP (last 3 results) No results for input(s): PROBNP in the last 8760 hours.   Creatinine clearance cannot be calculated (Unknown ideal weight.)  CBG: No results for input(s): GLUCAP in the last 168 hours.  Radiological Exams on Admission: Dg Chest 1 View  12/25/2015  CLINICAL DATA:  Recent fall, wheezing, shortness of Breath EXAM: CHEST 1 VIEW COMPARISON:  07/02/2014 FINDINGS: Cardiomediastinal silhouette is stable. Status post CABG. No acute infiltrate or pleural effusion. No pulmonary edema. Atherosclerotic calcifications of thoracic aorta again noted. Mild degenerative changes lower thoracic spine. IMPRESSION: No active disease.  Status post CABG. Electronically Signed   By: Lahoma Crocker M.D.   On: 12/25/2015 12:42   Ct Head Wo Contrast  12/25/2015  CLINICAL DATA:  83 year old with several recent falls. Confusion and left hip pain. EXAM: CT HEAD WITHOUT  CONTRAST TECHNIQUE: Contiguous axial images were obtained from the base of the skull through the vertex without intravenous contrast. COMPARISON:  None. FINDINGS: Brain: There is no evidence of acute intracranial hemorrhage, mass lesion, brain edema or extra-axial fluid collection. The ventricles and subarachnoid spaces are appropriately sized for age. There is no CT evidence of acute cortical infarction. There is low-density within the periventricular white matter, likely secondary to chronic small vessel ischemic changes. Intracranial vascular calcifications are present. Bones/sinuses/visualized face: The visualized paranasal sinuses, mastoid air cells and middle ears are clear. The calvarium is intact. IMPRESSION: No acute intracranial findings. Age-appropriate atrophy and mild periventricular white matter disease, likely due to chronic small vessel ischemic changes. Electronically Signed   By: Richardean Sale M.D.   On: 12/25/2015 14:04   Ct Pelvis Wo Contrast  12/25/2015  CLINICAL DATA:  Patient with several recent falls. Patient is confused. Reported left hip pain. EXAM: CT PELVIS WITHOUT CONTRAST TECHNIQUE: Multidetector CT imaging of the pelvis was performed following the standard protocol without intravenous contrast. COMPARISON:  06/30/2014 FINDINGS: There is no evidence of a fracture. Left total hip arthroplasty is well-seated and aligned. No evidence loosening. There is concentric hip joint space narrowing on the right. Minimal spurring is noted along the base of the femoral head. SI joints are normally spaced and aligned. There is a chronic bone defect along the superior left ilium consistent with a bone graft harvesting site. The bones are demineralized. No bone lesion. There are atherosclerotic calcifications along the aorta, iliac and femoral vessels. There are no pelvic masses or adenopathy. There are no abnormal fluid collections. Diverticula are noted along the visualize left colon. No  diverticulitis. Visualized bowel is otherwise unremarkable. Surrounding soft tissues are unremarkable.  No hip joint effusion. IMPRESSION: 1. No acute finding. No evidence of a fracture for of loosening of the left hip total arthroplasty. Electronically Signed   By: Lajean Manes M.D.   On: 12/25/2015 14:07   Dg Hip Unilat  With Pelvis 2-3 Views Right  12/25/2015  CLINICAL DATA:  Right hip pain since falling 4 days ago. EXAM: DG HIP (WITH OR WITHOUT PELVIS) 2-3V RIGHT COMPARISON:  One-view pelvis 06/30/2014. FINDINGS: The bones appear mildly demineralized. There is no  evidence of acute fracture or dislocation. Patient is status post left total hip arthroplasty. The visualized hardware appears unchanged. There are stable mild degenerative changes at the right hip and sacroiliac joints. Vascular clips are present in both groins. IMPRESSION: No evidence of acute right hip fracture or dislocation. Electronically Signed   By: Richardean Sale M.D.   On: 12/25/2015 12:40      Assessment/Plan Active Problems:   HYPERCHOLESTEROLEMIA   HYPERTENSION, BENIGN SYSTEMIC   Bronchitis   Acute respiratory distress ([name]   Diabetes mellitus with complication ([name]   AKI (acute kidney injury) ([place])   PAF (paroxysmal atrial fibrillation) ([name]   S/P CABG (coronary artery bypass graft)   S/P AVR (aortic valve replacement)  Acute Respiratory failure: Patient is to Make with respirations of as high as 32/m with denies wheezing and rhonchi and increased effort. No formal history of COPD but anticipate this is simply undiagnosed in a patient with a 50 pack year smoking history. Patient feels some improvement with nebulizer treatments in ED. Chest x-ray without evidence of pneumonia. WBC 13.9. Afebrile. - Telemetry, observation - Duo nebs - Mucinex - Levaquin - Solu-Medrol 60 mg every 6 hours - Outpatient PFTs - Pro calcitonin - Flu PCR - ABG  PAF/CAD s/p CABG/AVR: Patient currently tachycardic and in atrial  fibrillation. Suspect most of this is from acute illness, dehydration and nebulizer treatments. Patient asymptomatic. CHA2DS2-VASc = 8. Not an anticoaguation candidate. H/o diastolic CHF, Grade 1 - no decompensation - Cycle troponin - continue ASA - continue bblocker - IV dilt prn Hr >=120. - daily wts, strict I/O - Hydralazine PRN HTN  AoCKD: Cr 2.58. Baseline 1.6. Likely from dehydration from current illness - hold ACEi - IVF - BMET in am  HLD: - continue statin  GERD: - continue pepcid  DM:  - continue  lantus - SSI    Code Status: DNR  DVT Prophylaxis: Hep Family Communication: wife Disposition Plan: Pending Improvement    Jacqueline Delapena J, MD Family Medicine [facility] [email or web address] Password [email or web address]

## 2015-12-25 NOTE — ED Notes (Signed)
Respiratory called about CAT

## 2015-12-25 NOTE — Progress Notes (Signed)
Pt. Was wearing bipap but was complaining that he felt like he was going to get sick. Pt. Was coughing continuously and wanted to come off the bipap. Pt. Given a breathing tx. And placed on 5L [place]. Pt. Tolerating well at this time. RT to monitor.

## 2015-12-25 NOTE — ED Notes (Signed)
Patient aware Urine needed.

## 2015-12-25 NOTE — Plan of Care (Addendum)
Asked to f/up ABG on pt after Bipap. PO2 and pH are worsening. Per RN, pt is still labored. Will adjust bipap and recheck ABG in 1 hour. Spoke to RN ED and to [place] with RT. Pt being transported to [place].  KJKG, NP [facility] Saw pt on arrival to [facility]. Nauseated with Bipap. Next ABG resulted as OK. Phenergan/Zofran prn. Pt continually nauseated and vomited x 1 with Bipap. Not tolerating. Bipap removed. Pt does not feel SOB. No tachypnea so far off Bipap. Will continue to monitor.  Per RN, vomitus looked bloody. Will occult and recheck H/H.  KJKG, NP [facility] After above, saw troponin further elevated at 0.36. Given n/v, will get an EKG for comparison to previous. No CP or SOB. Likely demand ischemia from Afib. Will follow.  KJKG, NP [facility] RN called stating pt had either vomited or coughed up mucus that was + occult. Hemoglobin 9.6. 10.7 on admission and as low as 8.4 on chart in the past.  NP to bedside.   S: Pt is a poor historian but he says he thinks he is coughing it up, not vomiting. Says he has trouble with his sinuses at home sometimes and will see some blood streaks in his mucus. However, he did c/o nausea earlier with the Bipap. He denies taking NSAIDs at home .Denies a hx of GIB or ulcers. He really can't give me details on any EGDs of colonoscopies he has had. Review of PCP chart notes shows polyps on colonoscopy in 1997. No test since. No bloody BMs or hematemesis at home. Usually has a BM ever 3-4 days. No hx hemorrhoids.  O: BP 115/62, HR 90-110, RR 22-24. SaO2 94-96% on RA.  NP inspected what was in emesis basin and it is clear, thin with dark blood about size of a quarter. Alert. Now, off Bipap and doing fine respiratory wise. His EKG showed no acute changes. His abdomen is slightly distended on exam with very hypoactive BS. Slightly tender to LLQ and epigastric area, but no rebound or guarding.  Plan: occult + but ? mucus vs vomitus. CXR did not show any disease or mass. Check KUB. Monitor  serial CBC. Check PT/INR. Hold ASA and Heparin sq for now. Apply SCDs. Will follow after tests. Further plan depends on results and clinical issues. Do not feel it is necessary to call GI at this point or get CT abd or chest. Vitals are stable.  Will follow.  KJKG, NP [facility] Update: BMP came back with sugar 538, open gap and CO2 of 16. Check ketones. Start insulin drip and DKA protocol. Abd film shows distention but no true SBO or ileus.  KJKG, NP [facility] Update: Pt has had NO further n/v. No further evidence of bleeding. H/H remains stable. Troponin decreased now. Still satting normally on RA. No tachypnea. Sugars coming down on insulin drip. BMP this am to recheck CO2/gap. Ketones were + on admit yesterday.  KJKG, NP [facility]

## 2015-12-25 NOTE — Progress Notes (Addendum)
Of note patient decomp state from a respiratory as well as cardiac standpoint heart end of shift. Patient was given 20 mg of diltiazem in order to help control patient's heart rate. There is little to no improvement. Patient's blood pressure did drop significantly with the diltiazem. IV bolus was given. It is unclear whether not since elevated heart rate was due to A. fib with RVR versus stress-induced and medication induced from acute respiratory failure, dehydration, and albuterol and steroids. Cardiology was called to help assist in patient's care. We will continue with troponin cycling and telemetry.  With patient's respiratory decompensation there is immediate concern for complete failure the patient was started on BiPAP. ABG at that time showed pH 7.4, PCO2 of 24.7, PO2 of 183, bicarbonate of 15.4. Patient was tachypneic and on BiPAP at time of ABG. After a few minutes on BiPAP patient felt significantly better. We will leave this on for approximately 2 hours and then redrawn ABG and trial patient off of the BiPAP.  Linna Darner, MD [facility] 12/25/2015, 6:36 PM

## 2015-12-25 NOTE — ED Notes (Signed)
EDP placed patient on BiPAP. Expiratory wheezing noted after CAT

## 2015-12-26 ENCOUNTER — Inpatient Hospital Stay (HOSPITAL_COMMUNITY): Payer: Medicare Other

## 2015-12-26 LAB — BASIC METABOLIC PANEL
ANION GAP: 16 — AB (ref 5–15)
ANION GAP: 15 (ref 5–15)
Anion gap: 10 (ref 5–15)
BUN: 45 mg/dL — AB (ref 6–20)
BUN: 45 mg/dL — ABNORMAL HIGH (ref 6–20)
BUN: 45 mg/dL — AB (ref 6–20)
CALCIUM: 8.4 mg/dL — AB (ref 8.9–10.3)
CALCIUM: 8.4 mg/dL — AB (ref 8.9–10.3)
CHLORIDE: 100 mmol/L — AB (ref 101–111)
CHLORIDE: 105 mmol/L (ref 101–111)
CO2: 16 mmol/L — ABNORMAL LOW (ref 22–32)
CO2: 17 mmol/L — ABNORMAL LOW (ref 22–32)
CO2: 18 mmol/L — AB (ref 22–32)
CREATININE: 2.96 mg/dL — AB (ref 0.61–1.24)
Calcium: 8.2 mg/dL — ABNORMAL LOW (ref 8.9–10.3)
Chloride: 102 mmol/L (ref 101–111)
Creatinine, Ser: 3.12 mg/dL — ABNORMAL HIGH (ref 0.61–1.24)
Creatinine, Ser: 2.61 mg/dL — ABNORMAL HIGH (ref 0.61–1.24)
GFR calc Af Amer: 19 mL/min — ABNORMAL LOW (ref >60)
GFR calc Af Amer: 24 mL/min — ABNORMAL LOW (ref >60)
GFR, EST AFRICAN AMERICAN: 20 mL/min — AB (ref >60)
GFR, EST NON AFRICAN AMERICAN: 17 mL/min — AB (ref >60)
GFR, EST NON AFRICAN AMERICAN: 18 mL/min — AB (ref >60)
GFR, EST NON AFRICAN AMERICAN: 21 mL/min — AB (ref >60)
GLUCOSE: 538 mg/dL — AB (ref 65–99)
GLUCOSE: 176 mg/dL — AB (ref 65–99)
Glucose, Bld: 490 mg/dL — ABNORMAL HIGH (ref 65–99)
POTASSIUM: 3.8 mmol/L (ref 3.5–5.1)
POTASSIUM: 3.5 mmol/L (ref 3.5–5.1)
Potassium: 4 mmol/L (ref 3.5–5.1)
SODIUM: 134 mmol/L — AB (ref 135–145)
Sodium: 132 mmol/L — ABNORMAL LOW (ref 135–145)
Sodium: 133 mmol/L — ABNORMAL LOW (ref 135–145)

## 2015-12-26 LAB — GLUCOSE, CAPILLARY
GLUCOSE-CAPILLARY: 450 mg/dL — AB (ref 65–99)
GLUCOSE-CAPILLARY: 460 mg/dL — AB (ref 65–99)
GLUCOSE-CAPILLARY: 312 mg/dL — AB (ref 65–99)
GLUCOSE-CAPILLARY: 224 mg/dL — AB (ref 65–99)
GLUCOSE-CAPILLARY: 180 mg/dL — AB (ref 65–99)
GLUCOSE-CAPILLARY: 334 mg/dL — AB (ref 65–99)
GLUCOSE-CAPILLARY: 257 mg/dL — AB (ref 65–99)
Glucose-Capillary: 417 mg/dL — ABNORMAL HIGH (ref 65–99)
Glucose-Capillary: 411 mg/dL — ABNORMAL HIGH (ref 65–99)
Glucose-Capillary: 280 mg/dL — ABNORMAL HIGH (ref 65–99)
Glucose-Capillary: 206 mg/dL — ABNORMAL HIGH (ref 65–99)

## 2015-12-26 LAB — PROTIME-INR
INR: 1.2 (ref 0.00–1.49)
PROTHROMBIN TIME: 15.3 s — AB (ref 11.6–15.2)

## 2015-12-26 LAB — CBC
HEMATOCRIT: 29.2 % — AB (ref 39.0–52.0)
HEMOGLOBIN: 9.4 g/dL — AB (ref 13.0–17.0)
MCH: 29.7 pg (ref 26.0–34.0)
MCHC: 32.2 g/dL (ref 30.0–36.0)
MCV: 92.1 fL (ref 78.0–100.0)
Platelets: 135 10*3/uL — ABNORMAL LOW (ref 150–400)
RBC: 3.17 MIL/uL — AB (ref 4.22–5.81)
RDW: 14 % (ref 11.5–15.5)
WBC: 8.8 10*3/uL (ref 4.0–10.5)

## 2015-12-26 LAB — HEMOGLOBIN A1C
HEMOGLOBIN A1C: 7.1 % — AB (ref 4.8–5.6)
MEAN PLASMA GLUCOSE: 157 mg/dL

## 2015-12-26 LAB — HEMOGLOBIN AND HEMATOCRIT, BLOOD
HCT: 28.4 % — ABNORMAL LOW (ref 39.0–52.0)
HEMOGLOBIN: 9.6 g/dL — AB (ref 13.0–17.0)

## 2015-12-26 LAB — TROPONIN I: TROPONIN I: 0.11 ng/mL — AB (ref <0.031)

## 2015-12-26 LAB — MRSA PCR SCREENING: MRSA BY PCR: NEGATIVE

## 2015-12-26 LAB — OCCULT BLOOD GASTRIC / DUODENUM (SPECIMEN CUP): Occult Blood, Gastric: POSITIVE — AB

## 2015-12-26 MED ORDER — INSULIN GLARGINE 100 UNIT/ML ~~LOC~~ SOLN
10.0000 [IU] | Freq: Once | SUBCUTANEOUS | Status: AC
  Administered 2015-12-26: 10 [IU] via SUBCUTANEOUS
  Filled 2015-12-26: qty 0.1

## 2015-12-26 MED ORDER — INSULIN ASPART 100 UNIT/ML ~~LOC~~ SOLN
0.0000 [IU] | Freq: Three times a day (TID) | SUBCUTANEOUS | Status: DC
  Administered 2015-12-26: 15 [IU] via SUBCUTANEOUS
  Administered 2015-12-26: 7 [IU] via SUBCUTANEOUS
  Administered 2015-12-27 (×2): 11 [IU] via SUBCUTANEOUS
  Administered 2015-12-28: 7 [IU] via SUBCUTANEOUS

## 2015-12-26 MED ORDER — IPRATROPIUM BROMIDE 0.02 % IN SOLN
0.5000 mg | Freq: Four times a day (QID) | RESPIRATORY_TRACT | Status: DC
  Filled 2015-12-26: qty 2.5

## 2015-12-26 MED ORDER — SODIUM CHLORIDE 0.9 % IV SOLN
INTRAVENOUS | Status: DC
  Administered 2015-12-26 – 2015-12-28 (×2): via INTRAVENOUS
  Administered 2015-12-29: 100 mL/h via INTRAVENOUS
  Administered 2015-12-29: 01:00:00 via INTRAVENOUS
  Administered 2015-12-29: 100 mL/h via INTRAVENOUS
  Administered 2015-12-30 – 2015-12-31 (×3): via INTRAVENOUS

## 2015-12-26 MED ORDER — DILTIAZEM HCL 60 MG PO TABS
60.0000 mg | ORAL_TABLET | Freq: Three times a day (TID) | ORAL | Status: DC
  Administered 2015-12-26 – 2015-12-27 (×4): 60 mg via ORAL
  Filled 2015-12-26 (×5): qty 1

## 2015-12-26 MED ORDER — FAMOTIDINE IN NACL 20-0.9 MG/50ML-% IV SOLN
20.0000 mg | Freq: Once | INTRAVENOUS | Status: AC
  Administered 2015-12-26: 20 mg via INTRAVENOUS
  Filled 2015-12-26: qty 50

## 2015-12-26 MED ORDER — INSULIN GLARGINE 100 UNIT/ML ~~LOC~~ SOLN
28.0000 [IU] | Freq: Every day | SUBCUTANEOUS | Status: DC
  Filled 2015-12-26: qty 0.28

## 2015-12-26 MED ORDER — ASPIRIN 81 MG PO CHEW
81.0000 mg | CHEWABLE_TABLET | Freq: Every day | ORAL | Status: DC
  Administered 2015-12-26: 81 mg via ORAL
  Filled 2015-12-26: qty 1

## 2015-12-26 MED ORDER — SODIUM CHLORIDE 0.9 % IV SOLN: INTRAVENOUS | Status: DC

## 2015-12-26 MED ORDER — ACETONE (URINE) TEST VI STRP
1.0000 | ORAL_STRIP | Status: DC
  Filled 2015-12-26: qty 1

## 2015-12-26 MED ORDER — POTASSIUM CHLORIDE 10 MEQ/100ML IV SOLN
10.0000 meq | INTRAVENOUS | Status: AC
  Administered 2015-12-26 (×2): 10 meq via INTRAVENOUS

## 2015-12-26 MED ORDER — GLUCERNA SHAKE PO LIQD
237.0000 mL | Freq: Three times a day (TID) | ORAL | Status: DC
  Administered 2015-12-26 – 2015-12-27 (×3): 237 mL via ORAL

## 2015-12-26 MED ORDER — INSULIN GLARGINE 100 UNIT/ML ~~LOC~~ SOLN
34.0000 [IU] | Freq: Every day | SUBCUTANEOUS | Status: DC
  Administered 2015-12-26: 34 [IU] via SUBCUTANEOUS
  Filled 2015-12-26 (×2): qty 0.34

## 2015-12-26 MED ORDER — SODIUM CHLORIDE 0.9 % IV SOLN
INTRAVENOUS | Status: DC
  Administered 2015-12-26: 05:00:00 via INTRAVENOUS

## 2015-12-26 MED ORDER — LEVALBUTEROL HCL 0.63 MG/3ML IN NEBU
0.6300 mg | INHALATION_SOLUTION | Freq: Four times a day (QID) | RESPIRATORY_TRACT | Status: DC | PRN
  Administered 2015-12-27 – 2015-12-30 (×3): 0.63 mg via RESPIRATORY_TRACT
  Filled 2015-12-26 (×3): qty 3

## 2015-12-26 MED ORDER — PANTOPRAZOLE SODIUM 40 MG IV SOLR
INTRAVENOUS | Status: AC
  Filled 2015-12-26: qty 40

## 2015-12-26 MED ORDER — METHYLPREDNISOLONE SODIUM SUCC 40 MG IJ SOLR
40.0000 mg | Freq: Two times a day (BID) | INTRAMUSCULAR | Status: DC
  Administered 2015-12-26 – 2015-12-27 (×2): 40 mg via INTRAVENOUS
  Filled 2015-12-26 (×3): qty 1

## 2015-12-26 MED ORDER — HEPARIN SODIUM (PORCINE) 5000 UNIT/ML IJ SOLN
5000.0000 [IU] | Freq: Three times a day (TID) | INTRAMUSCULAR | Status: DC
  Administered 2015-12-26 – 2016-01-17 (×67): 5000 [IU] via SUBCUTANEOUS
  Filled 2015-12-26 (×67): qty 1

## 2015-12-26 MED ORDER — INSULIN ASPART 100 UNIT/ML ~~LOC~~ SOLN
6.0000 [IU] | Freq: Three times a day (TID) | SUBCUTANEOUS | Status: DC
  Administered 2015-12-27: 6 [IU] via SUBCUTANEOUS

## 2015-12-26 MED ORDER — BOOST / RESOURCE BREEZE PO LIQD
1.0000 | Freq: Two times a day (BID) | ORAL | Status: DC
  Administered 2015-12-26: 1 via ORAL

## 2015-12-26 MED ORDER — DEXTROSE-NACL 5-0.45 % IV SOLN: INTRAVENOUS | Status: DC

## 2015-12-26 MED ORDER — SIMETHICONE 40 MG/0.6ML PO SUSP
40.0000 mg | Freq: Four times a day (QID) | ORAL | Status: DC | PRN
Start: 2015-12-26 — End: 2016-01-18
  Administered 2015-12-26 – 2015-12-31 (×2): 40 mg via ORAL
  Filled 2015-12-26 (×3): qty 0.6

## 2015-12-26 MED ORDER — INSULIN ASPART 100 UNIT/ML ~~LOC~~ SOLN
4.0000 [IU] | Freq: Three times a day (TID) | SUBCUTANEOUS | Status: DC
  Administered 2015-12-26: 4 [IU] via SUBCUTANEOUS

## 2015-12-26 MED ORDER — IPRATROPIUM BROMIDE 0.02 % IN SOLN: 0.5000 mg | Freq: Four times a day (QID) | RESPIRATORY_TRACT | Status: DC

## 2015-12-26 MED ORDER — PANTOPRAZOLE SODIUM 40 MG IV SOLR
40.0000 mg | Freq: Two times a day (BID) | INTRAVENOUS | Status: DC
  Administered 2015-12-26: 40 mg via INTRAVENOUS

## 2015-12-26 MED ORDER — ATORVASTATIN CALCIUM 20 MG PO TABS
20.0000 mg | ORAL_TABLET | Freq: Every day | ORAL | Status: DC
  Administered 2015-12-26 – 2015-12-27 (×2): 20 mg via ORAL
  Filled 2015-12-26 (×2): qty 1

## 2015-12-26 MED ORDER — PANTOPRAZOLE SODIUM 40 MG PO TBEC
40.0000 mg | DELAYED_RELEASE_TABLET | Freq: Two times a day (BID) | ORAL | Status: DC
  Administered 2015-12-26 – 2015-12-28 (×5): 40 mg via ORAL
  Filled 2015-12-26 (×5): qty 1

## 2015-12-26 MED ORDER — IPRATROPIUM BROMIDE 0.02 % IN SOLN
0.5000 mg | Freq: Four times a day (QID) | RESPIRATORY_TRACT | Status: DC | PRN
  Administered 2015-12-27: 0.5 mg via RESPIRATORY_TRACT
  Filled 2015-12-26: qty 2.5

## 2015-12-26 MED ORDER — POTASSIUM CHLORIDE 10 MEQ/100ML IV SOLN
INTRAVENOUS | Status: AC
  Filled 2015-12-26: qty 200

## 2015-12-26 MED ORDER — SODIUM CHLORIDE 0.9 % IV SOLN
INTRAVENOUS | Status: DC
  Administered 2015-12-26: 4 [IU]/h via INTRAVENOUS
  Filled 2015-12-26: qty 2.5

## 2015-12-26 MED ORDER — LEVALBUTEROL HCL 0.63 MG/3ML IN NEBU
0.6300 mg | INHALATION_SOLUTION | Freq: Four times a day (QID) | RESPIRATORY_TRACT | Status: DC
  Filled 2015-12-26: qty 3

## 2015-12-26 MED ORDER — INSULIN ASPART 100 UNIT/ML ~~LOC~~ SOLN
0.0000 [IU] | Freq: Every day | SUBCUTANEOUS | Status: DC
  Administered 2015-12-26: 3 [IU] via SUBCUTANEOUS

## 2015-12-26 MED ORDER — LEVALBUTEROL HCL 0.63 MG/3ML IN NEBU
0.6300 mg | INHALATION_SOLUTION | Freq: Four times a day (QID) | RESPIRATORY_TRACT | Status: DC
Start: 2015-12-26 — End: 2015-12-26

## 2015-12-26 NOTE — Progress Notes (Addendum)
Initial Nutrition Assessment  DOCUMENTATION CODES:   Obesity unspecified  INTERVENTION:   -Glucerna Shake po TID, each supplement provides 220 kcal and 10 grams of protein  NUTRITION DIAGNOSIS:   Inadequate oral intake related to poor appetite as evidenced by meal completion < 25%.  GOAL:   Patient will meet greater than or equal to 90% of their needs  MONITOR:   PO intake, Supplement acceptance, Diet advancement, Labs, Weight trends, Skin, I & O's  REASON FOR ASSESSMENT:   Consult Assessment of nutrition requirement/status  ASSESSMENT:   Patient reports 2 day history of generalized weakness which resulted in patient falling out of bed 2 days ago. Developed hip pain from this fall which persisted into today. History of hip fracture and surgical repair. Over the last 2 days patient has had new onset wheezing and coughing with occasional phlegm production. Denies any fevers, chest pain, palpitations, LOC, nausea, vomiting. Patient does endorse runny nose and upper respiratory congestion. Very poor appetite during this period time. Very little oral intake is pushing fluids. Patient states that he smoked for 50 years approximately one pack per day but quit approximately 20 years ago.  Pt admitted with acute respiratory failure.   Pt transitioned from bi-pap to nasal cannula last night.   Spoke with pt at bedside. He reports poor appetite over the past 3-4 days. He reports he has consumed "literally nothing except water" during this time period and complains about inability to have a BM due to poor oral intake. Prior to this, pt reports his appetite is good, consuming 3-4 times daily (his son, who lives with him, does all the meal preparation and grocery shopping). Diet recall PTA consisted of Breakfast: cereal, coffee, and [employer] donut, Lunch: pack of nabs, Dinner: meat, starch, and vegetable.   Pt reports his UBW is 185#, however, he last weighed this over 35 years ago when he  retired. He reveals that his weight has slowly increased over the years due to inactivity.   Pt reports consuming very little on clear liquid diet this AM; noted meal tray has been untouched.   Nutrition-Focused physical exam completed. Findings are mild fat depletion, no muscle depletion, and no edema.   Case discussed with RN. She confirms very poor PO intake this shift. She also reveals that pt's blood sugars have been very high.   Labs reviewed: Na: 134 (on IV supplementation), CBGS: 280-460 (trending down). Pt currently receiving IV steroids. Last Hgb A1c: 7.1 (taken 12/25/15), which demonstrates good control given advanced age and steroid use.   ADDENDUM: Diet has been advanced to carb modified. Will add Glucerna Shake TID.  Diet Order:  Diet clear liquid Room service appropriate?: Yes; Fluid consistency:: Thin  Skin:  Reviewed, no issues  Last BM:  PTA  Height:   Ht Readings from Last 1 Encounters:  12/25/15 5\' 9"  (1.753 m)    Weight:   Wt Readings from Last 1 Encounters:  12/26/15 212 lb 1.3 oz (96.2 kg)    Ideal Body Weight:  72.7 kg  BMI:  Body mass index is 31.3 kg/(m^2).  Estimated Nutritional Needs:   Kcal:  1800-2000  Protein:  85-100 grams  Fluid:  1.8-2.0 L  EDUCATION NEEDS:   Education needs addressed  Mckenna Gamm A. Jimmye Norman, RD, LDN, CDE Pager: [phone] After hours Pager: [phone]

## 2015-12-26 NOTE — Progress Notes (Signed)
    Subjective:  Denies CP; dyspnea some better; complains of N/V   Objective:  Filed Vitals:   12/25/15 2317 12/25/15 2330 12/26/15 0209 12/26/15 0300  BP:      Pulse:      Temp:  98.1 F (36.7 C)  98.3 F (36.8 C)  TempSrc:  Axillary  Axillary  Resp:      Height:      Weight:   212 lb 1.3 oz (96.2 kg)   SpO2: 100%       Intake/Output from previous day:  Intake/Output Summary (Last 24 hours) at 12/26/15 0804 Last data filed at 12/26/15 0641  Gross per 24 hour  Intake      0 ml  Output    550 ml  Net   -550 ml    Physical Exam: Physical exam: Well-developed chronically ill appearing in no acute distress.  Skin is warm and dry.  HEENT is normal.  Neck is supple.  Chest mild rhonchi Cardiovascular exam is irregular Abdominal exam nontender; mildly distended. No masses palpated. Extremities show no edema. neuro grossly intact    Lab Results: Basic Metabolic Panel:  Recent Labs  12/26/15 0252 12/26/15 0541  NA 132* 134*  K 3.8 4.0  CL 100* 102  CO2 16* 17*  GLUCOSE 538* 490*  BUN 45* 45*  CREATININE 3.12* 2.96*  CALCIUM 8.2* 8.4*   CBC:  Recent Labs  12/25/15 1226 12/26/15 0050 12/26/15 0252  WBC 13.9*  --  8.8  NEUTROABS 12.9*  --   --   HGB 10.7* 9.6* 9.4*  HCT 32.6* 28.4* 29.2*  MCV 90.8  --  92.1  PLT 138*  --  135*   Cardiac Enzymes:  Recent Labs  12/25/15 1744 12/25/15 2234 12/26/15 0252  TROPONINI 0.11* 0.36* 0.11*     Assessment/Plan:  1 atrial flutter versus multifocal atrial tachycardia-Telemetry has been reviewed. Patient appears to have sinus tachycardia with nonconducted PACs versus MAT. Discontinue IV Cardizem and transition to 60 mg every 8 hours. Await echocardiogram. 2 COPD/bronchitis-continue bronchodilators, steroids and antibiotics. Management per primary care. 3 history of aortic valve replacement-follow-up echocardiogram. 4 Acute on chronic kidney disease-follow renal function closely. 5 disease-continue  aspirin and statin. 6 elevated troponin-likely related to atrial arrhythmias and renal insufficiency. No plans for further ischemia evaluation.  Kirk Ruths 12/26/2015, 8:04 AM

## 2015-12-26 NOTE — Care Management Note (Signed)
Case Management Note  Patient Details  Name: DERRIL GALDI MRN: [id number] Date of Birth: 1927/05/30  Subjective/Objective:       Adm w bronchitis, hx copd             Action/Plan: lives w fam, pcp dr Lucretia Roers   Expected Discharge Date:                  Expected Discharge Plan:  [place]  In-House Referral:     Discharge planning Services  CM Consult  Post Acute Care Choice:    Choice offered to:     DME Arranged:    DME Agency:     HH Arranged:    [place] Agency:     Status of Service:     Medicare Important Message Given:    Date Medicare IM Given:    Medicare IM give by:    Date Additional Medicare IM Given:    Additional Medicare Important Message give by:     If discussed at [place] of Stay Meetings, dates discussed:    Additional Comments:may need hhc. Will moniter. Ur review done.  Lacretia Leigh, RN 12/26/2015, 9:58 AM

## 2015-12-26 NOTE — Progress Notes (Signed)
Inpatient Diabetes Program Recommendations  AACE/ADA: New Consensus Statement on Inpatient Glycemic Control (2015)  Target Ranges:  Prepandial:   less than 140 mg/dL      Peak postprandial:   less than 180 mg/dL (1-2 hours)      Critically ill patients:  140 - 180 mg/dL  Results for DOYEL, TALBOT (MRN [id number]) as of 12/26/2015 14:44  Ref. Range 12/25/2015 18:17 12/25/2015 21:45 12/26/2015 04:48 12/26/2015 06:07 12/26/2015 07:09 12/26/2015 08:11 12/26/2015 09:15 12/26/2015 12:24  Glucose-Capillary Latest Ref Range: 65-99 mg/dL 300 (H) 450 (H) 460 (H) 417 (H) 411 (H) 312 (H) 280 (H) 206 (H)   Review of Glycemic Control  Diabetes history: DM2 Outpatient Diabetes medications: Lantus 28 units QHS, Glipizide XL 5 mg QAM Current orders for Inpatient glycemic control: Novolog 0-20 units TID with meals, Novolog 0-5 units QHS  Inpatient Diabetes Program Recommendations: Insulin - Basal: Please consider increasing Lantus to 30 units QHS. Insulin - Meal Coverage: Noted steroids were decreased today. While inpatient and ordered steroids, please consider ordering Novolog 4 units TID with meals for meal coverage.  NOTE: In reviewing the chart, noted patient was placed on insulin drip last night. Patient had received Lantus 28 units at 22:20 on 12/25/15 and then received Lantus 10 units today at 11:57 prior to being transitioned off the IV insulin drip. Patient does not currently have any basal insulin ordered.  Thanks, Barnie Alderman, RN, MSN, CDE Diabetes Coordinator Inpatient Diabetes Program [phone] (Team Pager from [place] to [place]) [phone] ([place] office) [phone] [facility] office) [phone] [facility] office)

## 2015-12-26 NOTE — Progress Notes (Signed)
[place] TEAM PROGRESS NOTE  Eric [name] [id number] DOB: 05-07-1927 DOA: 12/25/2015 PCP: [name], MD  Admit HPI / Brief Narrative: 82 y.o. male who c/o a 2 day history of generalized weakness which lead to him falling out of bed 2 days prior. He developed hip pain from this fall. He also noted the new onset wheezing and coughing with occasional phlegm production, and a runny nose w/ upper respiratory congestion. He also noted a very poor appetite with very little oral intake.   He came to the ER and was found to be tachycardic (had P waves - was irregular - possible MAT).  He was treated with nebs with improvement of resp failure and tightness. His HR continued to climb to 135. He was given Cardizem 20 mg with BP dropping after. He was given a fluid bolus, and a dilt drip at 5 mg was begun.   HPI/Subjective: The patient is sitting up in a bedside chair.  He states he feels very bloated after using BiPAP and his abdomen is distended.  He denies chest pain or shortness of breath at the present time stating that has improved significantly.  Assessment/Plan:  Acute COPD exac  No infiltrate on CXR - resp status appears to be stabilizing - follow w/ nebs - minimize steroids asap   Tachyarrhythmia - Sinus Tachy w/ non-conducted PACs v/s MAT Cardiology following   DM2 - acutely severely uncontrolled  A1c 7.1 - started on insulin gtt last night - suspect anion gap is due to AKF and not true DKA - cont to hydrate - stop insulin gtt as soon as CBG under control   N/V KUB noted gaseous distention of stomach on BIPAP - suspect bloating is mostly air blown in w/ BIPAP - simethicone - follow    Acute on chronic kidney disease Baseline creatinine 1.6 - creatinine at presentation 2.58 w/ peak of 3.12 - hydrate and follow trend - avoid ACE/ARB   Hyponatremia Appears to be due to hypovolemia and hyperglycemia - hydrate and tx DM and follow   Coronary artery  disease status post CABG x3 2010 Cont ASA + lipid med - denies cp   Aortic stenosis status post valve replacement 2010 Pericardial tissue valve   Carotid artery stenosis  Dopplers May 2015 showed a 60-79% right and 1-39% left stenosis  Hyperlipidemia Cont med tx  GERD  HTN Not an active issue at this time   Code Status: DO NOT RESUSCITATE Family Communication: no family present at time of exam Disposition Plan: SDU   Consultants: [facility] Cards   Procedures: None   Antibiotics: levaquin 3/7 >  DVT prophylaxis: Subcutaneous heparin  Objective: Blood pressure 123/54, pulse 101, temperature 98.2 F (36.8 C), temperature source Oral, resp. rate 21, height 5\' 9"  (1.753 m), weight 96.2 kg (212 lb 1.3 oz), SpO2 95 %.  Intake/Output Summary (Last 24 hours) at 12/26/15 0858 Last data filed at 12/26/15 0700  Gross per 24 hour  Intake 1569.07 ml  Output    550 ml  Net 1019.07 ml   Exam: General: No acute respiratory distress - alert  Lungs: distant bs th/o - no active wheeze - no focal crackles  Cardiovascular: irregular - no appreciable gallup or rub  Abdomen: distended, tympanic, non-tender, soft, no appreciable mass  Extremities: no significant cyanosis, clubbing, or edema bilateral lower extremities  Data Reviewed:  Basic Metabolic Panel:  Recent Labs Lab 12/25/15 1226 12/25/15 1744 12/26/15 0252 12/26/15 0541  NA  131*  --  132* 134*  K 4.2  --  3.8 4.0  CL 97*  --  100* 102  CO2 22  --  16* 17*  GLUCOSE 253*  --  538* 490*  BUN 35*  --  45* 45*  CREATININE 2.58* 2.75* 3.12* 2.96*  CALCIUM 8.8*  --  8.2* 8.4*    CBC:  Recent Labs Lab 12/25/15 1226 12/26/15 0050 12/26/15 0252  WBC 13.9*  --  8.8  NEUTROABS 12.9*  --   --   HGB 10.7* 9.6* 9.4*  HCT 32.6* 28.4* 29.2*  MCV 90.8  --  92.1  PLT 138*  --  135*   Liver Function Tests:  Recent Labs Lab 12/25/15 1226  AST 54*  ALT 29  ALKPHOS 60  BILITOT 1.6*  PROT 6.8  ALBUMIN 3.0*    Coags:  Recent Labs Lab 12/26/15 0252  INR 1.20   Cardiac Enzymes:  Recent Labs Lab 12/25/15 1744 12/25/15 2234 12/26/15 0252  TROPONINI 0.11* 0.36* 0.11*    CBG:  Recent Labs Lab 12/25/15 1817 12/25/15 2145 12/26/15 0448 12/26/15 0607  GLUCAP 300* 450* 460* 417*    Recent Results (from the past 240 hour(s))  MRSA PCR Screening     Status: None   Collection Time: 12/25/15 10:28 PM  Result Value Ref Range Status   MRSA by PCR NEGATIVE NEGATIVE Final    Comment:        The GeneXpert MRSA Assay (FDA approved for NASAL specimens only), is one component of a comprehensive MRSA colonization surveillance program. It is not intended to diagnose MRSA infection nor to guide or monitor treatment for MRSA infections.     Studies:   Recent x-ray studies have been reviewed in detail by the Attending Physician  Scheduled Meds:  Scheduled Meds: . aspirin  81 mg Oral Daily  . diltiazem  60 mg Oral 3 times per day  . famotidine  20 mg Oral Daily  . guaiFENesin  1,200 mg Oral BID  . heparin subcutaneous  5,000 Units Subcutaneous 3 times per day  . ipratropium-albuterol  3 mL Nebulization Q4H  . [START ON 12/27/2015] levofloxacin (LEVAQUIN) IV  500 mg Intravenous Q48H  . methylPREDNISolone (SOLU-MEDROL) injection  60 mg Intravenous Q6H  . pantoprazole  40 mg Oral BID  . simvastatin  40 mg Oral QHS  . sodium chloride flush  3 mL Intravenous Q12H    Time spent on care of this patient: 35 mins   Teejay Meader T , MD   [facility] Office  [phone] Pager - Text Page per Shea Evans as per below:  On-Call/Text Page:      [email or web address]      password [email or web address]  If 7PM-7AM, please contact night-coverage [email or web address] Password [email or web address] 12/26/2015, 8:58 AM   LOS: 1 day

## 2015-12-26 NOTE — Clinical Documentation Improvement (Signed)
Hospitalist Please update your documentation within the medical record to reflect your response to this query. Thank you Can the diagnosis of CKD be further specified?   CKD Stage I - GFR greater than or equal to 90  CKD Stage II - GFR 60-89  CKD Stage III - GFR 30-59  CKD Stage IV - GFR 15-29  CKD Stage V - GFR < 15  ESRD (End Stage Renal Disease)  Other condition  Unable to clinically determine  Supporting Information: : (risk factors, signs and symptoms, diagnostics, treatment) 12/26/15 progr note.Marland KitchenMarland Kitchen"Acute on chronic kidney disease. Baseline creatinine 1.6 - creatinine at presentation 2.58 w/ peak of 3.12 - hydrate and follow trend - avoid ACE/ARB "... Results for MAKYLE, ESLICK (MRN [id number]) as of 12/26/2015 14:35  12/25/2015 17:44 12/26/2015 02:52 12/26/2015 05:41 12/26/2015 11:45  EGFR (Non-African Amer.) 19 (L) 17 (L) 18 (L) 21 (L)   Please exercise your independent, professional judgment when responding. A specific answer is not anticipated or expected.  Thank You, Ermelinda Das, RN, BSN, [place] Certified Clinical Documentation Specialist [place]: Health Information Management [phone]

## 2015-12-26 NOTE — Evaluation (Signed)
Physical Therapy Evaluation Patient Details Name: Eric [name] MRN: [id number] DOB: 10-16-1927 Today's Date: 12/26/2015   History of Present Illness  pt presents with A-flutter, COPD Exacerbation, and N/V.  pt with hx of AVR, CABG, HTN, DM, Shingles, and L THR.    Clinical Impression  Pt needs 2 person A for all aspects of mobility and presents with impaired cognition, though no family present to determine if this is baseline for pt.  At this time pt will need SNF level of care and therapies to maximize independence and decrease burden of care.  Will continue to follow.      Follow Up Recommendations SNF    Equipment Recommendations  None recommended by PT    Recommendations for Other Services       Precautions / Restrictions Precautions Precautions: Fall Restrictions Weight Bearing Restrictions: No      Mobility  Bed Mobility Overal bed mobility: Needs Assistance;+2 for physical assistance Bed Mobility: Supine to Sit     Supine to sit: Mod assist;+2 for physical assistance     General bed mobility comments: pt needs A with Bil LEs and bringing trunk up to sitting.    Transfers Overall transfer level: Needs assistance Equipment used: 2 person hand held assist Transfers: Sit to/from [facility] Sit to Stand: Mod assist;+2 physical assistance Stand pivot transfers: Mod assist;+2 physical assistance       General transfer comment: cues for UE use and movement through pivot to recliner.    Ambulation/Gait                Stairs            Wheelchair Mobility    Modified Rankin (Stroke Patients Only)       Balance Overall balance assessment: Needs assistance Sitting-balance support: Bilateral upper extremity supported;Feet supported Sitting balance-Leahy Scale: Fair     Standing balance support: During functional activity Standing balance-Leahy Scale: Poor                               Pertinent Vitals/Pain  Pain Assessment: Faces Faces Pain Scale: Hurts little more Pain Location: Indicates a "bad hip" L. Pain Descriptors / Indicators: Grimacing Pain Intervention(s): Monitored during session;Premedicated before session;Repositioned    Home Living Family/patient expects to be discharged to:: Skilled nursing facility                 Additional Comments: pt initially states he lives alone and then mentions later his son lives with him.      Prior Function Level of Independence: Needs assistance   Gait / Transfers Assistance Needed: Mentions a RW and a Cane, but unclear which he uses.    ADL's / Homemaking Assistance Needed: pt does indicate his son helps him, but it is hard to determine exactly how much help he is given.    Comments: Unclear exactly how much A pt needs as he is inconsistent with his answers.       Hand Dominance        Extremity/Trunk Assessment   Upper Extremity Assessment: Defer to OT evaluation           Lower Extremity Assessment: Generalized weakness      Cervical / Trunk Assessment: Kyphotic  Communication   Communication: Expressive difficulties ([place] and difficult to understand.)  Cognition Arousal/Alertness: Awake/alert Behavior During Therapy: WFL for tasks assessed/performed Overall Cognitive Status: No family/caregiver present to determine baseline  cognitive functioning                      General Comments      Exercises        Assessment/Plan    PT Assessment Patient needs continued PT services  PT Diagnosis Difficulty walking;Generalized weakness;Altered mental status   PT Problem List Decreased strength;Decreased activity tolerance;Decreased balance;Decreased mobility;Decreased coordination;Decreased cognition;Decreased knowledge of use of DME;Decreased safety awareness;Pain  PT Treatment Interventions DME instruction;Gait training;Functional mobility training;Therapeutic activities;Therapeutic exercise;Balance  training;Patient/family education;Cognitive remediation   PT Goals (Current goals can be found in the Care Plan section) Acute Rehab PT Goals Patient Stated Goal: Go home now. PT Goal Formulation: With patient Time For Goal Achievement: 01/09/16 Potential to Achieve Goals: Good    Frequency Min 2X/week   Barriers to discharge Other (comment) Unclear CG support.    Co-evaluation               End of Session Equipment Utilized During Treatment: Gait belt Activity Tolerance: Patient limited by fatigue Patient left: in chair;with call bell/phone within reach;with nursing/sitter in room Nurse Communication: Mobility status         Time: [id number] PT Time Calculation (min) (ACUTE ONLY): 21 min   Charges:   PT Evaluation $PT Eval Moderate Complexity: 1 Procedure     PT G CodesCatarina Hartshorn, [place] 12/26/2015, 11:27 AM

## 2015-12-27 ENCOUNTER — Inpatient Hospital Stay (HOSPITAL_COMMUNITY): Payer: Medicare Other

## 2015-12-27 DIAGNOSIS — I4892 Unspecified atrial flutter: Secondary | ICD-10-CM

## 2015-12-27 DIAGNOSIS — N179 Acute kidney failure, unspecified: Secondary | ICD-10-CM | POA: Insufficient documentation

## 2015-12-27 DIAGNOSIS — N189 Chronic kidney disease, unspecified: Secondary | ICD-10-CM

## 2015-12-27 DIAGNOSIS — A419 Sepsis, unspecified organism: Secondary | ICD-10-CM

## 2015-12-27 DIAGNOSIS — E118 Type 2 diabetes mellitus with unspecified complications: Secondary | ICD-10-CM

## 2015-12-27 DIAGNOSIS — R0602 Shortness of breath: Secondary | ICD-10-CM

## 2015-12-27 DIAGNOSIS — I272 Pulmonary hypertension, unspecified: Secondary | ICD-10-CM | POA: Diagnosis present

## 2015-12-27 DIAGNOSIS — E871 Hypo-osmolality and hyponatremia: Secondary | ICD-10-CM

## 2015-12-27 DIAGNOSIS — Z952 Presence of prosthetic heart valve: Secondary | ICD-10-CM | POA: Diagnosis present

## 2015-12-27 DIAGNOSIS — I420 Dilated cardiomyopathy: Secondary | ICD-10-CM | POA: Diagnosis present

## 2015-12-27 DIAGNOSIS — E872 Acidosis, unspecified: Secondary | ICD-10-CM | POA: Diagnosis present

## 2015-12-27 DIAGNOSIS — E1165 Type 2 diabetes mellitus with hyperglycemia: Secondary | ICD-10-CM | POA: Diagnosis present

## 2015-12-27 DIAGNOSIS — I4891 Unspecified atrial fibrillation: Secondary | ICD-10-CM

## 2015-12-27 DIAGNOSIS — IMO0002 Reserved for concepts with insufficient information to code with codable children: Secondary | ICD-10-CM | POA: Diagnosis present

## 2015-12-27 DIAGNOSIS — J441 Chronic obstructive pulmonary disease with (acute) exacerbation: Secondary | ICD-10-CM | POA: Diagnosis present

## 2015-12-27 DIAGNOSIS — I10 Essential (primary) hypertension: Secondary | ICD-10-CM | POA: Insufficient documentation

## 2015-12-27 LAB — COMPREHENSIVE METABOLIC PANEL
ALBUMIN: 2.6 g/dL — AB (ref 3.5–5.0)
ALT: 40 U/L (ref 17–63)
ANION GAP: 14 (ref 5–15)
AST: 54 U/L — ABNORMAL HIGH (ref 15–41)
Alkaline Phosphatase: 53 U/L (ref 38–126)
BUN: 51 mg/dL — ABNORMAL HIGH (ref 6–20)
CO2: 15 mmol/L — AB (ref 22–32)
Calcium: 8.5 mg/dL — ABNORMAL LOW (ref 8.9–10.3)
Chloride: 106 mmol/L (ref 101–111)
Creatinine, Ser: 2.47 mg/dL — ABNORMAL HIGH (ref 0.61–1.24)
GFR calc non Af Amer: 22 mL/min — ABNORMAL LOW (ref >60)
GFR, EST AFRICAN AMERICAN: 25 mL/min — AB (ref >60)
GLUCOSE: 278 mg/dL — AB (ref 65–99)
POTASSIUM: 4.2 mmol/L (ref 3.5–5.1)
SODIUM: 135 mmol/L (ref 135–145)
Total Bilirubin: 0.8 mg/dL (ref 0.3–1.2)
Total Protein: 6 g/dL — ABNORMAL LOW (ref 6.5–8.1)

## 2015-12-27 LAB — LACTIC ACID, PLASMA: Lactic Acid, Venous: 1.3 mmol/L (ref 0.5–2.0)

## 2015-12-27 LAB — ECHOCARDIOGRAM COMPLETE
Height: 69 in
Weight: 3555.58 oz

## 2015-12-27 LAB — POCT I-STAT 3, ART BLOOD GAS (G3+)
Acid-base deficit: 10 mmol/L — ABNORMAL HIGH (ref 0.0–2.0)
BICARBONATE: 15.5 meq/L — AB (ref 20.0–24.0)
O2 Saturation: 72 %
PCO2 ART: 30.7 mmHg — AB (ref 35.0–45.0)
TCO2: 16 mmol/L (ref 0–100)
pH, Arterial: 7.307 — ABNORMAL LOW (ref 7.350–7.450)
pO2, Arterial: 40 mmHg — ABNORMAL LOW (ref 80.0–100.0)

## 2015-12-27 LAB — CBC
HEMATOCRIT: 27.8 % — AB (ref 39.0–52.0)
HEMOGLOBIN: 9.3 g/dL — AB (ref 13.0–17.0)
MCH: 30.7 pg (ref 26.0–34.0)
MCHC: 33.5 g/dL (ref 30.0–36.0)
MCV: 91.7 fL (ref 78.0–100.0)
Platelets: 136 10*3/uL — ABNORMAL LOW (ref 150–400)
RBC: 3.03 MIL/uL — AB (ref 4.22–5.81)
RDW: 14.4 % (ref 11.5–15.5)
WBC: 15.5 10*3/uL — ABNORMAL HIGH (ref 4.0–10.5)

## 2015-12-27 LAB — GLUCOSE, CAPILLARY
GLUCOSE-CAPILLARY: 282 mg/dL — AB (ref 65–99)
GLUCOSE-CAPILLARY: 276 mg/dL — AB (ref 65–99)
GLUCOSE-CAPILLARY: 106 mg/dL — AB (ref 65–99)
GLUCOSE-CAPILLARY: 125 mg/dL — AB (ref 65–99)

## 2015-12-27 LAB — URINALYSIS, ROUTINE W REFLEX MICROSCOPIC
Bilirubin Urine: NEGATIVE
Glucose, UA: 250 mg/dL — AB
KETONES UR: NEGATIVE mg/dL
LEUKOCYTES UA: NEGATIVE
NITRITE: NEGATIVE
PROTEIN: 30 mg/dL — AB
Specific Gravity, Urine: 1.021 (ref 1.005–1.030)
pH: 5.5 (ref 5.0–8.0)

## 2015-12-27 LAB — URINE MICROSCOPIC-ADD ON: Bacteria, UA: NONE SEEN

## 2015-12-27 LAB — PROCALCITONIN: PROCALCITONIN: 1.27 ng/mL

## 2015-12-27 MED ORDER — PERFLUTREN LIPID MICROSPHERE
INTRAVENOUS | Status: AC
  Administered 2015-12-27: 4 mL
  Filled 2015-12-27: qty 10

## 2015-12-27 MED ORDER — IPRATROPIUM BROMIDE 0.02 % IN SOLN
0.5000 mg | Freq: Four times a day (QID) | RESPIRATORY_TRACT | Status: DC
  Administered 2015-12-28: 0.5 mg via RESPIRATORY_TRACT
  Filled 2015-12-27: qty 2.5

## 2015-12-27 MED ORDER — ASPIRIN 81 MG PO CHEW
81.0000 mg | CHEWABLE_TABLET | Freq: Every day | ORAL | Status: DC
  Administered 2015-12-27 – 2015-12-28 (×2): 81 mg via ORAL
  Filled 2015-12-27 (×2): qty 1

## 2015-12-27 MED ORDER — PERFLUTREN LIPID MICROSPHERE
1.0000 mL | INTRAVENOUS | Status: AC | PRN
  Filled 2015-12-27: qty 10

## 2015-12-27 MED ORDER — BENZONATATE 100 MG PO CAPS
200.0000 mg | ORAL_CAPSULE | Freq: Three times a day (TID) | ORAL | Status: DC | PRN
  Administered 2015-12-27: 200 mg via ORAL
  Filled 2015-12-27: qty 2

## 2015-12-27 MED ORDER — METOPROLOL TARTRATE 1 MG/ML IV SOLN
5.0000 mg | Freq: Once | INTRAVENOUS | Status: AC
Start: 2015-12-27 — End: 2015-12-27
  Administered 2015-12-27: 5 mg via INTRAVENOUS
  Filled 2015-12-27: qty 5

## 2015-12-27 MED ORDER — INSULIN ASPART 100 UNIT/ML ~~LOC~~ SOLN
10.0000 [IU] | Freq: Three times a day (TID) | SUBCUTANEOUS | Status: DC
  Administered 2015-12-28: 10 [IU] via SUBCUTANEOUS

## 2015-12-27 MED ORDER — INSULIN GLARGINE 100 UNIT/ML ~~LOC~~ SOLN
40.0000 [IU] | Freq: Every day | SUBCUTANEOUS | Status: DC
  Administered 2015-12-27: 40 [IU] via SUBCUTANEOUS
  Filled 2015-12-27 (×2): qty 0.4

## 2015-12-27 MED ORDER — LORAZEPAM 2 MG/ML IJ SOLN
2.0000 mg | INTRAMUSCULAR | Status: DC | PRN
  Administered 2015-12-27 – 2015-12-28 (×6): 2 mg via INTRAVENOUS
  Filled 2015-12-27 (×2): qty 2
  Filled 2015-12-27 (×2): qty 1

## 2015-12-27 MED ORDER — METHYLPREDNISOLONE SODIUM SUCC 40 MG IJ SOLR
40.0000 mg | Freq: Every day | INTRAMUSCULAR | Status: DC
  Administered 2015-12-28: 40 mg via INTRAVENOUS
  Filled 2015-12-27: qty 1

## 2015-12-27 MED ORDER — IPRATROPIUM BROMIDE 0.02 % IN SOLN
0.5000 mg | Freq: Four times a day (QID) | RESPIRATORY_TRACT | Status: DC
  Filled 2015-12-27: qty 2.5

## 2015-12-27 MED ORDER — DILTIAZEM HCL 60 MG PO TABS
60.0000 mg | ORAL_TABLET | Freq: Four times a day (QID) | ORAL | Status: DC
  Administered 2015-12-27 – 2015-12-28 (×2): 60 mg via ORAL
  Filled 2015-12-27 (×3): qty 1

## 2015-12-27 NOTE — Progress Notes (Signed)
ABG that is in for 09:21 is a venous gas. Dr. Sherral Hammers was made aware at 09:27 & stated that no repeat ABG was needed.

## 2015-12-27 NOTE — Progress Notes (Signed)
Came to assess pt and give neb, pt refuse neb. Pt following commands and answering questions at this time, no distress or complications noted. Pt is satting 100% on 5l [place].

## 2015-12-27 NOTE — Progress Notes (Signed)
Pt appeared to have elevated ST segment. Completed EKG and pt still in Afib with a rapid rate. Contacted Dr. Radford Pax regarding pt HR in 130's/140's. Gave po cardizem, and MD told to call back if HR not resolved.

## 2015-12-27 NOTE — Progress Notes (Signed)
    Subjective:  Denies CP; dyspnea persists but some better   Objective:  Filed Vitals:   12/27/15 0600 12/27/15 0725 12/27/15 0759 12/27/15 0800  BP: 122/76 136/124  96/82  Pulse: 141 116  93  Temp:   97.4 F (36.3 C)   TempSrc:   Oral   Resp: 19 19  22   Height:      Weight:      SpO2: 100% 97%  96%    Intake/Output from previous day:  Intake/Output Summary (Last 24 hours) at 12/27/15 0817 Last data filed at 12/27/15 0600  Gross per 24 hour  Intake 2326.75 ml  Output    540 ml  Net 1786.75 ml    Physical Exam: Physical exam: Well-developed chronically ill appearing in no acute distress.  Skin is warm and dry.  HEENT is normal.  Neck is supple.  Chest mild basilar crackles Cardiovascular exam is irregular Abdominal exam nontender; mildly distended. No masses palpated. Extremities show no edema. neuro grossly intact    Lab Results: Basic Metabolic Panel:  Recent Labs  12/26/15 1145 12/27/15 0440  NA 133* 135  K 3.5 4.2  CL 105 106  CO2 18* 15*  GLUCOSE 176* 278*  BUN 45* 51*  CREATININE 2.61* 2.47*  CALCIUM 8.4* 8.5*   CBC:  Recent Labs  12/25/15 1226  12/26/15 0252 12/27/15 0440  WBC 13.9*  --  8.8 15.5*  NEUTROABS 12.9*  --   --   --   HGB 10.7*  < > 9.4* 9.3*  HCT 32.6*  < > 29.2* 27.8*  MCV 90.8  --  92.1 91.7  PLT 138*  --  135* 136*  < > = values in this interval not displayed. Cardiac Enzymes:  Recent Labs  12/25/15 1744 12/25/15 2234 12/26/15 0252  TROPONINI 0.11* 0.36* 0.11*     Assessment/Plan:  1 atrial flutter versus multifocal atrial tachycardia-Telemetry has been reviewed. Patient now in atrial fibrillation. Change cardizem to 60 Q 6; transition to CD when dose stable. Await echocardiogram. Check TSH; needs anticoagulation long term. However, with recent fall, he would be high risk. Continue ASA for now and reassess as outpt. 2 COPD/bronchitis-continue bronchodilators, steroids and antibiotics. Management per primary  care. 3 history of aortic valve replacement-follow-up echocardiogram. 4 Acute on chronic kidney disease-Renal function unchanged; DC IVFs; may need nephrology to see. 5 disease-continue ASA and statin. 6 elevated troponin-likely related to atrial arrhythmias and renal insufficiency. No plans for further ischemia evaluation.  Eric [name] 12/27/2015, 8:17 AM

## 2015-12-27 NOTE — Clinical Social Work Note (Signed)
CSW received consult for patient needing SNF for short term rehab.  CSW met with patient's daughter Jerold Coombe who was at bedside to discuss and explain bed SNF search process.  Patient's daughter agreed to have CSW begin bed search in [facility] for patient.  Patient's daughter stated patient has been to rehab in the past and was at [facility].  CSW explained bed search process and role of CSW.  Formal assessment to follow.  Jones Broom. [place], MSW, [place] 12/27/2015 6:25 PM

## 2015-12-27 NOTE — Progress Notes (Signed)
OT Cancellation Note  Patient Details Name: Eric [name] MRN: [id number] DOB: 1927/02/27   Cancelled Treatment:    Reason Eval/Treat Not Completed: Medical issues which prohibited therapy. Pt with agitation this morning. Now resting comfortably. RN requesting hold for now. Will continue to follow.  [name] 12/27/2015, 12:50 PM

## 2015-12-27 NOTE — Progress Notes (Signed)
Pt receiving 52mL/hr NS infusion and wheezing. Baltazar Najjar, NP ordered decrease in infusion rate and breathing treatment.

## 2015-12-27 NOTE — Progress Notes (Signed)
It is been endorsed by night shift nurse that pt. is confused and agitated, pulling out iv line and monitor, and trying to get out of bed. Discussed with charge nurse and Surveyor, quantity the need for sitter but not granted due to  inadequate staffing. MD made aware ativan given but still agitated after 2 hours, stat abg done, result seen by [facility] by md , another 2 mg ativan given as ordered continue to monitor.

## 2015-12-27 NOTE — Progress Notes (Signed)
[name] TEAM 1 - Stepdown/ICU TEAM Progress Note  Eric [name] [id number] DOB: 04-03-1927 DOA: 12/25/2015 PCP: [name], MD  Admit HPI / Brief Narrative: 82 y.o. WM PMHx  CVA, CAD native artery S/P CABG 2010, S/P bioprosthetic AV replacement 2010, HTN, HLD, DM Type 2, Bilateral hydrocele, C-spine Foraminal Stenosis  c/o a 2 day history of generalized weakness which lead to him falling out of bed 2 days prior. He developed hip pain from this fall. He also noted the new onset wheezing and coughing with occasional phlegm production, and a runny nose w/ upper respiratory congestion. He also noted a very poor appetite with very little oral intake.   He came to the ER and was found to be tachycardic (had P waves - was irregular - possible MAT). He was treated with nebs with improvement of resp failure and tightness. His HR continued to climb to 135. He was given Cardizem 20 mg with BP dropping after. He was given a fluid bolus, and a dilt drip at 5 mg was begun.    HPI/Subjective: 3/9  A/O 2 (does not know where, why), negative N/V, negative SOB. Patient continues to pull off  02 and all leads. When off O2 patient's SPO2~80%.   Assessment/Plan: Sepsis/ Acute COPD exacerbation  -On admission patient met guidelines for sepsis HR> 90, RR> 20, WBC> 12 -No infiltrate on CXR - resp status appears to be stabilizing  - Atrovent QID, Xopenex PRN -Solu-Medrol 40 mg daily  Metabolic acidosis with anion gap -Possibly multifactorial to include uremia, DKA? -Urinalysis pending -Urine culture pending  Dilated Cardiomyopathy -Strict in and out since admission +3.1 L -Daily weight admission = 95.2 kg           3/9 bed weight= 100.8 kg -Echocardiogram pending  Tachyarrhythmia - Sinus Tachy w/ non-conducted PACs v/s MAT/A. fib RVR -Cardiology following -Currently patient in A. fib RVR -Continue Cardizem 60 mg QID -Metoprolol 5 mg 1  Pulmonary hypertension -See dilated  cardiomyopathy/tachyarrhythmia  Coronary artery disease status post CABG x3 2010 Cont ASA + lipid med - denies cp   Aortic stenosis status post valve replacement 2010 Pericardial tissue valve   Carotid artery stenosis  Dopplers May 2015 showed a 60-79% right and 1-39% left stenosis  HTN Not an active issue at this time   DM Type2 - acutely severely uncontrolled -3/7 Hemoglobin A1c =7.1 -Increase Lantus to 40 QHS -Increase NovoLog to 10 units with meals -Continue normal saline 33m/hr  N/V -KUB noted gaseous distention of stomach on BIPAP - suspect bloating is mostly air blown in w/ BIPAP - simethicone - follow   Acute on chronic kidney disease(Baseline creatinine 1.6)  - creatinine peak of 3.12  - hydrate and follow trend  - avoid ACE/ARB   Hyponatremia Appears to be due to hypovolemia and hyperglycemia - hydrate and tx DM and follow    Hyperlipidemia Cont med tx  GERD     Code Status: DO NOT RESUSCITATE  Family Communication: no family present at time of exam Disposition Plan:     Consultants: Dr. BLelon [facility]   Procedure/Significant Events:    Culture 3/7 influenza A/B/H1N1 negative 3/7 MRSA negative by PCR 3/9 urine pending 3/9 blood pending   Antibiotics: Levofloxacin 3/7>  DVT prophylaxis: Subcutaneous heparin   Devices    LINES / TUBES:      Continuous Infusions: . sodium chloride 50 mL/hr at 12/27/15 1123    Objective: VITAL SIGNS: Temp: 97.4 F (36.3 C) (  03/09 0759) Temp Source: Oral (03/09 0759) BP: 96/82 mmHg (03/09 0800) Pulse Rate: 93 (03/09 0800) SPO2; FIO2:   Intake/Output Summary (Last 24 hours) at 12/27/15 1142 Last data filed at 12/27/15 1000  Gross per 24 hour  Intake 1943.75 ml  Output    540 ml  Net 1403.75 ml     Exam: General: A/O 2 (does not know where, why), NAD, No acute respiratory distress Eyes: Negative headache, negative scleral hemorrhage ENT: Negative Runny nose, negative  gingival bleeding, Neck:  Negative scars, masses, torticollis, lymphadenopathy, JVD Lungs: Clear to auscultation bilaterally without wheezes or crackles Cardiovascular: Irregular irregular rhythm and rate, without murmur gallop or rub normal S1 and S2 Abdomen:negative abdominal pain, nondistended, positive soft, bowel sounds, no rebound, no ascites, no appreciable mass Extremities: No significant cyanosis, clubbing, or edema bilateral lower extremities Psychiatric:  Negative depression, negative anxiety, negative fatigue, negative mania  Neurologic:  Cranial nerves II through XII intact, tongue/uvula midline, all extremities muscle strength 5/5, sensation intact throughout,  negative dysarthria, negative expressive aphasia, negative receptive aphasia.   Data Reviewed: Basic Metabolic Panel:  Recent Labs Lab 12/25/15 1226 12/25/15 1744 12/26/15 0252 12/26/15 0541 12/26/15 1145 12/27/15 0440  NA 131*  --  132* 134* 133* 135  K 4.2  --  3.8 4.0 3.5 4.2  CL 97*  --  100* 102 105 106  CO2 22  --  16* 17* 18* 15*  GLUCOSE 253*  --  538* 490* 176* 278*  BUN 35*  --  45* 45* 45* 51*  CREATININE 2.58* 2.75* 3.12* 2.96* 2.61* 2.47*  CALCIUM 8.8*  --  8.2* 8.4* 8.4* 8.5*   Liver Function Tests:  Recent Labs Lab 12/25/15 1226 12/27/15 0440  AST 54* 54*  ALT 29 40  ALKPHOS 60 53  BILITOT 1.6* 0.8  PROT 6.8 6.0*  ALBUMIN 3.0* 2.6*   No results for input(s): LIPASE, AMYLASE in the last 168 hours. No results for input(s): AMMONIA in the last 168 hours. CBC:  Recent Labs Lab 12/25/15 1226 12/26/15 0050 12/26/15 0252 12/27/15 0440  WBC 13.9*  --  8.8 15.5*  NEUTROABS 12.9*  --   --   --   HGB 10.7* 9.6* 9.4* 9.3*  HCT 32.6* 28.4* 29.2* 27.8*  MCV 90.8  --  92.1 91.7  PLT 138*  --  135* 136*   Cardiac Enzymes:  Recent Labs Lab 12/25/15 1744 12/25/15 2234 12/26/15 0252  TROPONINI 0.11* 0.36* 0.11*   BNP (last 3 results) No results for input(s): BNP in the last 8760  hours.  ProBNP (last 3 results) No results for input(s): PROBNP in the last 8760 hours.  CBG:  Recent Labs Lab 12/26/15 1124 12/26/15 1224 12/26/15 1712 12/26/15 2135 12/27/15 0757  GLUCAP 180* 206* 334* 257* 282*    Recent Results (from the past 240 hour(s))  MRSA PCR Screening     Status: None   Collection Time: 12/25/15 10:28 PM  Result Value Ref Range Status   MRSA by PCR NEGATIVE NEGATIVE Final    Comment:        The GeneXpert MRSA Assay (FDA approved for NASAL specimens only), is one component of a comprehensive MRSA colonization surveillance program. It is not intended to diagnose MRSA infection nor to guide or monitor treatment for MRSA infections.      Studies:  Recent x-ray studies have been reviewed in detail by the Attending Physician  Scheduled Meds:  Scheduled Meds: . aspirin  81 mg Oral Daily  .  atorvastatin  20 mg Oral q1800  . diltiazem  60 mg Oral 4 times per day  . feeding supplement (GLUCERNA SHAKE)  237 mL Oral TID BM  . guaiFENesin  1,200 mg Oral BID  . heparin subcutaneous  5,000 Units Subcutaneous 3 times per day  . insulin aspart  0-20 Units Subcutaneous TID WC  . insulin aspart  0-5 Units Subcutaneous QHS  . insulin aspart  10 Units Subcutaneous TID WC  . insulin glargine  40 Units Subcutaneous QHS  . ipratropium  0.5 mg Nebulization Q6H  . levofloxacin (LEVAQUIN) IV  500 mg Intravenous Q48H  . [START ON 12/28/2015] methylPREDNISolone (SOLU-MEDROL) injection  40 mg Intravenous Daily  . metoprolol  5 mg Intravenous Once  . pantoprazole  40 mg Oral BID  . sodium chloride flush  3 mL Intravenous Q12H    Time spent on care of this patient: 40 mins   Kyrstyn Greear, Geraldo Docker , MD  [facility] Office  [phone] Pager - [phone]  On-Call/Text Page:      [email or web address]      password [email or web address]  If 7PM-7AM, please contact night-coverage [email or web address] Password [email or web address] 12/27/2015, 11:42 AM   LOS: 2 days   Care during the described  time interval was provided by me .  I have reviewed this patient's available data, including medical history, events of note, physical examination, and all test results as part of my evaluation. I have personally reviewed and interpreted all radiology studies.   Dia Crawford, MD [phone] Pager

## 2015-12-27 NOTE — Progress Notes (Signed)
Echocardiogram 2D Echocardiogram has been performed.  Eric [name] 12/27/2015, 11:56 AM

## 2015-12-27 NOTE — Progress Notes (Signed)
MD made aware that pt is been sleeping since 12 noon after ativan iv given. Continue to monitor.

## 2015-12-28 ENCOUNTER — Inpatient Hospital Stay (HOSPITAL_COMMUNITY): Payer: Medicare Other

## 2015-12-28 DIAGNOSIS — E7800 Pure hypercholesterolemia, unspecified: Secondary | ICD-10-CM

## 2015-12-28 LAB — COMPREHENSIVE METABOLIC PANEL
ALK PHOS: 53 U/L (ref 38–126)
ALT: 43 U/L (ref 17–63)
ANION GAP: 11 (ref 5–15)
AST: 39 U/L (ref 15–41)
Albumin: 2.7 g/dL — ABNORMAL LOW (ref 3.5–5.0)
BILIRUBIN TOTAL: 0.7 mg/dL (ref 0.3–1.2)
BUN: 55 mg/dL — ABNORMAL HIGH (ref 6–20)
CALCIUM: 8.8 mg/dL — AB (ref 8.9–10.3)
CO2: 17 mmol/L — ABNORMAL LOW (ref 22–32)
CREATININE: 2.22 mg/dL — AB (ref 0.61–1.24)
Chloride: 109 mmol/L (ref 101–111)
GFR calc non Af Amer: 25 mL/min — ABNORMAL LOW (ref >60)
GFR, EST AFRICAN AMERICAN: 29 mL/min — AB (ref >60)
GLUCOSE: 229 mg/dL — AB (ref 65–99)
Potassium: 4.3 mmol/L (ref 3.5–5.1)
Sodium: 137 mmol/L (ref 135–145)
TOTAL PROTEIN: 6 g/dL — AB (ref 6.5–8.1)

## 2015-12-28 LAB — CBC WITH DIFFERENTIAL/PLATELET
BASOS ABS: 0 10*3/uL (ref 0.0–0.1)
BASOS PCT: 0 %
EOS ABS: 0 10*3/uL (ref 0.0–0.7)
Eosinophils Relative: 0 %
HEMATOCRIT: 31.3 % — AB (ref 39.0–52.0)
Hemoglobin: 9.8 g/dL — ABNORMAL LOW (ref 13.0–17.0)
Lymphocytes Relative: 2 %
Lymphs Abs: 0.3 10*3/uL — ABNORMAL LOW (ref 0.7–4.0)
MCH: 28.8 pg (ref 26.0–34.0)
MCHC: 31.3 g/dL (ref 30.0–36.0)
MCV: 92.1 fL (ref 78.0–100.0)
MONO ABS: 0.2 10*3/uL (ref 0.1–1.0)
Monocytes Relative: 1 %
NEUTROS ABS: 12 10*3/uL — AB (ref 1.7–7.7)
NEUTROS PCT: 97 %
Platelets: 170 10*3/uL (ref 150–400)
RBC: 3.4 MIL/uL — ABNORMAL LOW (ref 4.22–5.81)
RDW: 14.6 % (ref 11.5–15.5)
WBC: 12.4 10*3/uL — ABNORMAL HIGH (ref 4.0–10.5)

## 2015-12-28 LAB — GLUCOSE, CAPILLARY
GLUCOSE-CAPILLARY: 241 mg/dL — AB (ref 65–99)
GLUCOSE-CAPILLARY: 238 mg/dL — AB (ref 65–99)
GLUCOSE-CAPILLARY: 217 mg/dL — AB (ref 65–99)
Glucose-Capillary: 201 mg/dL — ABNORMAL HIGH (ref 65–99)

## 2015-12-28 LAB — TSH: TSH: 0.643 u[IU]/mL (ref 0.350–4.500)

## 2015-12-28 LAB — MAGNESIUM: MAGNESIUM: 2.2 mg/dL (ref 1.7–2.4)

## 2015-12-28 MED ORDER — INSULIN ASPART 100 UNIT/ML ~~LOC~~ SOLN: 15.0000 [IU] | Freq: Three times a day (TID) | SUBCUTANEOUS | Status: DC

## 2015-12-28 MED ORDER — PANTOPRAZOLE SODIUM 40 MG IV SOLR
40.0000 mg | Freq: Two times a day (BID) | INTRAVENOUS | Status: DC
  Administered 2015-12-28 – 2016-01-01 (×8): 40 mg via INTRAVENOUS
  Filled 2015-12-28 (×8): qty 40

## 2015-12-28 MED ORDER — DILTIAZEM HCL 100 MG IV SOLR
5.0000 mg/h | INTRAVENOUS | Status: DC
  Administered 2015-12-28: 10 mg/h via INTRAVENOUS
  Administered 2015-12-28 – 2015-12-29 (×3): 5 mg/h via INTRAVENOUS
  Administered 2015-12-30 – 2016-01-01 (×5): 10 mg/h via INTRAVENOUS
  Filled 2015-12-28 (×9): qty 100

## 2015-12-28 MED ORDER — IPRATROPIUM BROMIDE 0.02 % IN SOLN
0.5000 mg | Freq: Three times a day (TID) | RESPIRATORY_TRACT | Status: DC
  Administered 2015-12-28 – 2016-01-04 (×21): 0.5 mg via RESPIRATORY_TRACT
  Filled 2015-12-28 (×21): qty 2.5

## 2015-12-28 MED ORDER — HYDROCORTISONE NA SUCCINATE PF 100 MG IJ SOLR
100.0000 mg | Freq: Three times a day (TID) | INTRAMUSCULAR | Status: DC
  Administered 2015-12-28 – 2015-12-30 (×7): 100 mg via INTRAVENOUS
  Filled 2015-12-28 (×7): qty 2

## 2015-12-28 MED ORDER — CETYLPYRIDINIUM CHLORIDE 0.05 % MT LIQD
7.0000 mL | Freq: Two times a day (BID) | OROMUCOSAL | Status: DC
  Administered 2015-12-28 – 2016-01-17 (×34): 7 mL via OROMUCOSAL

## 2015-12-28 MED ORDER — IPRATROPIUM BROMIDE 0.02 % IN SOLN: 0.5000 mg | Freq: Three times a day (TID) | RESPIRATORY_TRACT | Status: DC

## 2015-12-28 MED ORDER — HALOPERIDOL LACTATE 5 MG/ML IJ SOLN
3.0000 mg | Freq: Once | INTRAMUSCULAR | Status: AC
  Administered 2015-12-28: 3 mg via INTRAVENOUS
  Filled 2015-12-28: qty 1

## 2015-12-28 MED ORDER — LEVALBUTEROL HCL 0.63 MG/3ML IN NEBU
0.6300 mg | INHALATION_SOLUTION | Freq: Three times a day (TID) | RESPIRATORY_TRACT | Status: DC
  Administered 2015-12-28 – 2016-01-04 (×21): 0.63 mg via RESPIRATORY_TRACT
  Filled 2015-12-28 (×21): qty 3

## 2015-12-28 MED ORDER — LEVOFLOXACIN IN D5W 750 MG/150ML IV SOLN
750.0000 mg | INTRAVENOUS | Status: DC
Start: 2015-12-29 — End: 2015-12-31
  Administered 2015-12-29: 750 mg via INTRAVENOUS
  Filled 2015-12-28 (×2): qty 150

## 2015-12-28 MED ORDER — INSULIN ASPART 100 UNIT/ML ~~LOC~~ SOLN
0.0000 [IU] | SUBCUTANEOUS | Status: DC
  Administered 2015-12-28: 7 [IU] via SUBCUTANEOUS
  Administered 2015-12-29 (×4): 4 [IU] via SUBCUTANEOUS
  Administered 2015-12-29: 7 [IU] via SUBCUTANEOUS
  Administered 2015-12-30: 4 [IU] via SUBCUTANEOUS
  Administered 2015-12-30 (×2): 3 [IU] via SUBCUTANEOUS
  Administered 2015-12-30 (×2): 4 [IU] via SUBCUTANEOUS
  Administered 2015-12-31: 11 [IU] via SUBCUTANEOUS
  Administered 2015-12-31: 3 [IU] via SUBCUTANEOUS
  Administered 2015-12-31 (×2): 4 [IU] via SUBCUTANEOUS
  Administered 2015-12-31: 3 [IU] via SUBCUTANEOUS
  Administered 2016-01-01: 11 [IU] via SUBCUTANEOUS
  Administered 2016-01-01: 4 [IU] via SUBCUTANEOUS
  Administered 2016-01-01: 3 [IU] via SUBCUTANEOUS
  Administered 2016-01-01: 4 [IU] via SUBCUTANEOUS
  Administered 2016-01-01: 7 [IU] via SUBCUTANEOUS
  Administered 2016-01-01 – 2016-01-02 (×2): 4 [IU] via SUBCUTANEOUS
  Administered 2016-01-02: 3 [IU] via SUBCUTANEOUS
  Administered 2016-01-02: 4 [IU] via SUBCUTANEOUS

## 2015-12-28 MED ORDER — ASPIRIN 300 MG RE SUPP
300.0000 mg | Freq: Every day | RECTAL | Status: DC
  Administered 2015-12-29: 300 mg via RECTAL
  Filled 2015-12-28: qty 1

## 2015-12-28 NOTE — Progress Notes (Signed)
Patient back into afib RVR rate 120's-150's confirmed by EKG. Cardiology notified, received verbal order to restart cardizem drip. Will continue to monitor.

## 2015-12-28 NOTE — Clinical Social Work Placement (Signed)
   CLINICAL SOCIAL WORK PLACEMENT  NOTE  Date:  12/28/2015  Patient Details  Name: Eric [name] MRN: [id number] Date of Birth: 1926-11-15  Clinical Social Work is seeking post-discharge placement for this patient at the [facility] level of care (*CSW will initial, date and re-position this form in  chart as items are completed):  Yes   Patient/family provided with [facility] Work Department's list of facilities offering this level of care within the geographic area requested by the patient (or if unable, by the patient's family).  Yes   Patient/family informed of their freedom to choose among providers that offer the needed level of care, that participate in Medicare, Medicaid or managed care program needed by the patient, have an available bed and are willing to accept the patient.  Yes   Patient/family informed of [facility]'s ownership interest in [facility] and [facility], as well as of the fact that they are under no obligation to receive care at these facilities.  PASRR submitted to EDS on 12/28/15     PASRR number received on       Existing PASRR number confirmed on 12/28/15     FL2 transmitted to all facilities in geographic area requested by pt/family on 12/28/15     FL2 transmitted to all facilities within larger geographic area on       Patient informed that his/her managed care company has contracts with or will negotiate with certain facilities, including the following:            Patient/family informed of bed offers received.  Patient chooses bed at       Physician recommends and patient chooses bed at      Patient to be transferred to   on  .  Patient to be transferred to facility by       Patient family notified on   of transfer.  Name of family member notified:        PHYSICIAN Please sign FL2     Additional Comment:    _______________________________________________ Ross Ludwig, LCSWA 12/28/2015,  5:14 PM

## 2015-12-28 NOTE — Clinical Social Work Note (Signed)
Clinical Social Work Assessment  Patient Details  Name: Eric [name] MRN: [id number] Date of Birth: 1927/09/08  Date of referral:  12/27/15               Reason for consult:  Facility Placement                Permission sought to share information with:  Facility Sport and exercise psychologist, Family Supports Permission granted to share information::  Yes, Verbal Permission Granted  Name::     Manon Hilding Daughter [phone] or [phone]  Agency::  SNF admissions  Relationship::     Contact Information:     Housing/Transportation Living arrangements for the past 2 months:  Single Family Home Source of Information:  Adult Children Patient Interpreter Needed:  None Criminal Activity/Legal Involvement Pertinent to Current Situation/Hospitalization:  No - Comment as needed Significant Relationships:  Adult Children Lives with:  Self Do you feel safe going back to the place where you live?  Yes (Patient's daughter states that patient will probably not want to go to SNF, but she will talk to him about it once he is more alert and oriented.) Need for family participation in patient care:  Yes (Comment)  Care giving concerns:  Patient's daughter was at bedside and feels that patient will need some short term rehab before he can return back home, however she does not think he will agree to it.   Social Worker assessment / plan:  Patient was sleeping and not arousable, CSW spoke to patient's daughter who was at bedside.  Patient's daughter states he is a widowed 82 year old male who lives alone.  Patient has been to SNF in the past, for short term rehab, patient's daughter reports he was pleased with the therapy, however he was not very happy with some of the residents that were at [facility].  Patient's daughter was explained the bed search process and how insurance will pay for stay at SNF.  CSW explained to patient's daughter what to expect for discharge planning and once he arrives to  facility if he agrees to go.  Patient's daughter stated she did not have any other questions, but will contact CSW if she does.    Employment status:  Retired Forensic scientist:  Medicare PT Recommendations:  [place] / Referral to community resources:     Patient/Family's Response to care:  Patient's daughter is in agreement to having patient go to SNF, but she does not know if patient will agree.  Patient was not able to be aroused when CSW tried to speak with him.  Patient/Family's Understanding of and Emotional Response to Diagnosis, Current Treatment, and Prognosis:  Patient's daughter is aware of the emotional response to diagnosis and current treatment plan, CSW could not tell due to patient sleeping and not wanting to wake up.  Emotional Assessment Appearance:  Appears stated age Attitude/Demeanor/Rapport:  Unable to Assess Affect (typically observed):  Unable to Assess Orientation:  Oriented to Self Alcohol / Substance use:  Not Applicable Psych involvement (Current and /or in the community):  No (Comment)  Discharge Needs  Concerns to be addressed:  Lack of Support Readmission within the last 30 days:  No Current discharge risk:  Lack of support system, Lives alone Barriers to Discharge:  No Barriers Identified   Ross Ludwig, LCSWA 12/28/2015, 5:06 PM

## 2015-12-28 NOTE — Plan of Care (Signed)
Problem: Nutrition: Goal: Adequate nutrition will be maintained Outcome: Not Progressing Pt experiencing coughing after swallowing.  NPO and awaiting SLP evaluation.

## 2015-12-28 NOTE — Progress Notes (Signed)
[facility] TEAM 1 - Stepdown/ICU TEAM Progress Note  Eric [name] [id number] DOB: 04-Jan-1927 DOA: 12/25/2015 PCP: [name], MD  Admit HPI / Brief Narrative: 82 y.o. WM PMHx  CVA, CAD native artery S/P CABG 2010, S/P bioprosthetic AV replacement 2010, HTN, HLD, DM Type 2, Bilateral hydrocele, C-spine Foraminal Stenosis  c/o a 2 day history of generalized weakness which lead to him falling out of bed 2 days prior. He developed hip pain from this fall. He also noted the new onset wheezing and coughing with occasional phlegm production, and a runny nose w/ upper respiratory congestion. He also noted a very poor appetite with very little oral intake.   He came to the ER and was found to be tachycardic (had P waves - was irregular - possible MAT). He was treated with nebs with improvement of resp failure and tightness. His HR continued to climb to 135. He was given Cardizem 20 mg with BP dropping after. He was given a fluid bolus, and a dilt drip at 5 mg was begun.    HPI/Subjective: 3/10  A/O 0, patient sedated   Assessment/Plan: Sepsis/ Acute COPD exacerbation  -On admission patient met guidelines for sepsis HR> 90, RR> 20, WBC> 12 -No infiltrate on CXR - Continue current antibiotic complete 7 day course -Continue normal saline 100 ml/hr - Atrovent QID, Xopenex PRN -DC Solu-Medrol 40 mg daily; start stress dose steroids Solu-Cortef 923 TID  Metabolic acidosis with anion gap -Possibly multifactorial to include uremia, DKA? -Anion gap resolved, bicarbonate remains low -Urinalysis negative ketones -Urine culture NGTD   Dilated Cardiomyopathy -Strict in and out since admission +4.6 L -Daily weight admission = 95.2 kg           3/10 bed weight= 99.9 kg -Echocardiogram; essentially normal see results below -Transfuse for hemoglobin<8  Tachyarrhythmia - Sinus Tachy w/ non-conducted PACs v/s MAT/A. fib RVR -Cardiology following -Currently patient in A. fib  RVR -Cardizem 60 mg QID held; restart Cardizem infusion  Pulmonary hypertension -See dilated cardiomyopathy/tachyarrhythmia  Coronary artery disease status post CABG x3 2010 -Cont ASA + lipid med - denies cp   Aortic stenosis status post valve replacement 2010 -Pericardial tissue valve   Carotid artery stenosis  Dopplers May 2015 showed a 60-79% right and 1-39% left stenosis  HTN/Hypotension -HTN Not an active issue at this time, in fact patient is borderline hypotensive  -TSH WNL -See sepsis  DM Type2 - acutely severely uncontrolled -3/7 Hemoglobin A1c =7.1 -Increase Lantus to 40 QHS held, secondary to nothing by mouth -Increase NovoLog to 15 units with meals held secondary to nothing by mouth -Resistant SSI  N/V -KUB noted gaseous distention of stomach on BIPAP -Resolved    Acute on CKD stage III (Baseline creatinine 1.6)  - creatinine peak of 3.12-->> 2.2 - Cr continues to trend down with hydration  - avoid ACE/ARB   Hyponatremia -Resolved with hydration    Hyperlipidemia -Held secondary to altered mental status Cont med tx  GERD  Acute mental status change -Multifactorial to include sepsis, metabolic acidosis, iatrogenic (Ativan)+ acute on chronic renal failure, and CVA? -Obtain CT head    Code Status: DO NOT RESUSCITATE  Family Communication: no family present at time of exam Disposition Plan: Resolution sepsis/altered mental status    Consultants: Dr. Lelon Perla [facility]   Procedure/Significant Events: 3/9 echocardiogram;LVEF=60%-65%. -- Aortic valve: A bioprosthesis was present and functioning  normally. - Mitral valve: severe stenosis. Mean gradient (D): 11 mm Hg. - Right ventricle:  size was normal. Wall thickness normal. Systolic function was normal. - Tricuspid valve: trivial regurgitation.   Culture 3/7 influenza A/B/H1N1 negative 3/7 MRSA negative 3/9 urine NGTD  3/9 blood left  AC/hand NGTD    Antibiotics: Levofloxacin  3/7>  DVT prophylaxis: Subcutaneous heparin   Devices    LINES / TUBES:      Continuous Infusions: . sodium chloride 50 mL/hr at 12/28/15 1129  . diltiazem (CARDIZEM) infusion 10 mg/hr (12/28/15 1128)    Objective: VITAL SIGNS: Temp: 97.3 F (36.3 C) (03/10 1200) Temp Source: Oral (03/10 1200) BP: 108/67 mmHg (03/10 1200) Pulse Rate: 88 (03/10 1200) SPO2; FIO2:   Intake/Output Summary (Last 24 hours) at 12/28/15 1301 Last data filed at 12/28/15 1300  Gross per 24 hour  Intake 1876.54 ml  Output    475 ml  Net 1401.54 ml     Exam: General: A/O 0, patient sedated, NAD, No acute respiratory distress Eyes:  negative scleral hemorrhage ENT: Negative Runny nose, negative gingival bleeding, Neck:  Negative scars, masses, torticollis, lymphadenopathy, JVD Lungs: Clear to auscultation bilaterally without wheezes or crackles Cardiovascular: Irregular irregular rhythm and rate, without murmur gallop or rub normal S1 and S2 Abdomen:negative abdominal pain, nondistended, positive soft, bowel sounds, no rebound, no ascites, no appreciable mass Extremities: No significant cyanosis, clubbing, or edema bilateral lower extremities Psychiatric:  Altered mental status unable to evaluate  Neurologic:  Altered mental status unable to evaluate.   Data Reviewed: Basic Metabolic Panel:  Recent Labs Lab 12/26/15 0252 12/26/15 0541 12/26/15 1145 12/27/15 0440 12/28/15 0300  NA 132* 134* 133* 135 137  K 3.8 4.0 3.5 4.2 4.3  CL 100* 102 105 106 109  CO2 16* 17* 18* 15* 17*  GLUCOSE 538* 490* 176* 278* 229*  BUN 45* 45* 45* 51* 55*  CREATININE 3.12* 2.96* 2.61* 2.47* 2.22*  CALCIUM 8.2* 8.4* 8.4* 8.5* 8.8*  MG  --   --   --   --  2.2   Liver Function Tests:  Recent Labs Lab 12/25/15 1226 12/27/15 0440 12/28/15 0300  AST 54* 54* 39  ALT 29 40 43  ALKPHOS 60 53 53  BILITOT 1.6* 0.8 0.7  PROT 6.8 6.0* 6.0*  ALBUMIN 3.0* 2.6* 2.7*   No results for input(s):  LIPASE, AMYLASE in the last 168 hours. No results for input(s): AMMONIA in the last 168 hours. CBC:  Recent Labs Lab 12/25/15 1226 12/26/15 0050 12/26/15 0252 12/27/15 0440 12/28/15 0300  WBC 13.9*  --  8.8 15.5* 12.4*  NEUTROABS 12.9*  --   --   --  12.0*  HGB 10.7* 9.6* 9.4* 9.3* 9.8*  HCT 32.6* 28.4* 29.2* 27.8* 31.3*  MCV 90.8  --  92.1 91.7 92.1  PLT 138*  --  135* 136* 170   Cardiac Enzymes:  Recent Labs Lab 12/25/15 1744 12/25/15 2234 12/26/15 0252  TROPONINI 0.11* 0.36* 0.11*   BNP (last 3 results) No results for input(s): BNP in the last 8760 hours.  ProBNP (last 3 results) No results for input(s): PROBNP in the last 8760 hours.  CBG:  Recent Labs Lab 12/27/15 0757 12/27/15 1142 12/27/15 1713 12/27/15 2115 12/28/15 0834  GLUCAP 282* 276* 106* 125* 241*    Recent Results (from the past 240 hour(s))  MRSA PCR Screening     Status: None   Collection Time: 12/25/15 10:28 PM  Result Value Ref Range Status   MRSA by PCR NEGATIVE NEGATIVE Final    Comment:  The GeneXpert MRSA Assay (FDA approved for NASAL specimens only), is one component of a comprehensive MRSA colonization surveillance program. It is not intended to diagnose MRSA infection nor to guide or monitor treatment for MRSA infections.   Culture, blood (routine x 2)     Status: None (Preliminary result)   Collection Time: 12/27/15  4:09 PM  Result Value Ref Range Status   Specimen Description BLOOD LEFT ANTECUBITAL  Final   Special Requests IN PEDIATRIC BOTTLE 3CC  Final   Culture NO GROWTH < 24 HOURS  Final   Report Status PENDING  Incomplete  Culture, blood (routine x 2)     Status: None (Preliminary result)   Collection Time: 12/27/15  4:15 PM  Result Value Ref Range Status   Specimen Description BLOOD LEFT HAND  Final   Special Requests IN PEDIATRIC BOTTLE 3CC  Final   Culture NO GROWTH < 24 HOURS  Final   Report Status PENDING  Incomplete  Culture, Urine     Status:  None (Preliminary result)   Collection Time: 12/27/15  6:32 PM  Result Value Ref Range Status   Specimen Description URINE, RANDOM  Final   Special Requests NONE  Final   Culture NO GROWTH < 24 HOURS  Final   Report Status PENDING  Incomplete     Studies:  Recent x-ray studies have been reviewed in detail by the Attending Physician  Scheduled Meds:  Scheduled Meds: . aspirin  81 mg Oral Daily  . atorvastatin  20 mg Oral q1800  . diltiazem  60 mg Oral 4 times per day  . feeding supplement (GLUCERNA SHAKE)  237 mL Oral TID BM  . guaiFENesin  1,200 mg Oral BID  . heparin subcutaneous  5,000 Units Subcutaneous 3 times per day  . insulin aspart  0-20 Units Subcutaneous TID WC  . insulin aspart  0-5 Units Subcutaneous QHS  . insulin aspart  10 Units Subcutaneous TID WC  . insulin glargine  40 Units Subcutaneous QHS  . ipratropium  0.5 mg Nebulization Q6H  . [START ON 12/29/2015] levofloxacin (LEVAQUIN) IV  750 mg Intravenous Q48H  . methylPREDNISolone (SOLU-MEDROL) injection  40 mg Intravenous Daily  . pantoprazole  40 mg Oral BID  . sodium chloride flush  3 mL Intravenous Q12H    Time spent on care of this patient: 40 mins   WOODS, Geraldo Docker , MD  [facility] Office  [phone] Pager - [phone]  On-Call/Text Page:      [email or web address]      password [email or web address]  If 7PM-7AM, please contact night-coverage [email or web address] Password [email or web address] 12/28/2015, 1:01 PM   LOS: 3 days   Care during the described time interval was provided by me .  I have reviewed this patient's available data, including medical history, events of note, physical examination, and all test results as part of my evaluation. I have personally reviewed and interpreted all radiology studies.   Dia Crawford, MD [phone] Pager

## 2015-12-28 NOTE — Care Management Important Message (Signed)
Important Message  Patient Details  Name: Eric [name] MRN: [id number] Date of Birth: [birth year]   Medicare Important Message Given:  Yes    Venita Seng P Cruz Devilla 12/28/2015, 12:51 PM

## 2015-12-28 NOTE — NC FL2 (Signed)
[place] LEVEL OF CARE SCREENING TOOL     IDENTIFICATION  Patient Name: Eric [name] Birthdate: [birth year] Sex: male Admission Date (Current Location): 12/25/2015  [facility] and [place] Number:  Engineering geologist and Address:  [facility] [place]. [facility], [place] [address] [place], [place] [address]      Provider Number: [id number]  Attending Physician Name and Address:  Allie Bossier, MD  Relative Name and Phone Number:  Manon Hilding Daughter [phone] or [phone]    Current Level of Care: Hospital Recommended Level of Care: [place] Prior Approval Number:    Date Approved/Denied:   PASRR Number: [id number]  Discharge Plan: SNF    Current Diagnoses: Patient Active Problem List   Diagnosis Date Noted  . Sepsis ([place])   . COPD exacerbation ([place])   . Metabolic acidosis   . Dilated cardiomyopathy ([place])   . Pulmonary hypertension ([place])   . CAD in native artery   . H/O aortic valve replacement   . Essential hypertension   . Uncontrolled type 2 diabetes mellitus with complication ([place])   . Acute on chronic renal failure ([place])   . Hyponatremia   . Bronchitis 12/25/2015  . Acute respiratory distress ([name] 12/25/2015  . Diabetes mellitus with complication ([place]) [id number]  . AKI (acute kidney injury) ([place]) 12/25/2015  . PAF (paroxysmal atrial fibrillation) ([place]) 12/25/2015  . S/P CABG (coronary artery bypass graft) 12/25/2015  . S/P AVR (aortic valve replacement) 12/25/2015  . Atrial fibrillation with RVR ([place]) 12/25/2015  . Multifocal atrial tachycardia ([place]) 12/25/2015  . Dehydration   . Advanced care planning/counseling discussion 03/29/2015  . GERD (gastroesophageal reflux disease) 07/08/2014  . Postoperative atrial fibrillation ([place]) 07/02/2014  . Status post total replacement of left hip 06/30/2014  . Medicare annual wellness visit, subsequent 03/08/2012  . CKD (chronic kidney disease) stage 3, GFR 30-59  ml/min 09/24/2010  . AORTIC VALVE REPLACEMENT, HX OF 09/03/2010  . BPH (benign prostatic hypertrophy) with urinary obstruction 01/10/2010  . Cerebrovascular disease 02/28/2009  . GERD 02/28/2009  . Type 2 diabetes, controlled, with neuropathy ([place]) 12/17/2006  . NEUROPATHY, DIABETIC 12/17/2006  . HYPERCHOLESTEROLEMIA 12/17/2006  . HYPERTENSION, BENIGN SYSTEMIC 12/17/2006  . Coronary atherosclerosis 12/17/2006    Orientation RESPIRATION BLADDER Height & Weight     Self  O2 (2 L) Continent Weight: 220 lb 3.8 oz (99.9 kg) Height:  5\' 9"  (175.3 cm)  BEHAVIORAL SYMPTOMS/MOOD NEUROLOGICAL BOWEL NUTRITION STATUS      Continent Diet (Carb Modified)  AMBULATORY STATUS COMMUNICATION OF NEEDS Skin   Limited Assist Verbally Surgical wounds                       Personal Care Assistance Level of Assistance  Bathing, Dressing Bathing Assistance: Limited assistance   Dressing Assistance: Limited assistance     Functional Limitations Info             SPECIAL CARE FACTORS FREQUENCY  PT (By licensed PT)  OT (By licensed OT)     PT Frequency: 5x a week  OT Frequency: 3x a week              Contractures      Additional Factors Info  Code Status, Allergies Code Status Info: DNR Allergies Info: NKA           Current Medications (12/28/2015):  This is the current hospital active medication list Current Facility-Administered Medications  Medication Dose Route Frequency Provider Last Rate  Last Dose  . 0.9 %  sodium chloride infusion   Intravenous Continuous Allie Bossier, MD 50 mL/hr at 12/28/15 1129    . acetaminophen (TYLENOL) tablet 650 mg  650 mg Oral Q6H PRN Waldemar Dickens, MD       Or  . acetaminophen (TYLENOL) suppository 650 mg  650 mg Rectal Q6H PRN Waldemar Dickens, MD      . antiseptic oral rinse (CPC / CETYLPYRIDINIUM CHLORIDE 0.05%) solution 7 mL  7 mL Mouth Rinse BID Allie Bossier, MD      . aspirin chewable tablet 81 mg  81 mg Oral Daily Lelon Perla, MD   81 mg at 12/28/15 1333  . atorvastatin (LIPITOR) tablet 20 mg  20 mg Oral q1800 Cherene Altes, MD   20 mg at 12/27/15 1933  . benzonatate (TESSALON) capsule 200 mg  200 mg Oral TID PRN Gardiner Barefoot, NP   200 mg at 12/27/15 0309  . diltiazem (CARDIZEM) 100 mg in dextrose 5 % 100 mL (1 mg/mL) infusion  5-15 mg/hr Intravenous Titrated Jules Husbands, MD 5 mL/hr at 12/28/15 1500 5 mg/hr at 12/28/15 1500  . diltiazem (CARDIZEM) tablet 60 mg  60 mg Oral 4 times per day Lelon Perla, MD   60 mg at 12/28/15 1334  . feeding supplement (GLUCERNA SHAKE) (GLUCERNA SHAKE) liquid 237 mL  237 mL Oral TID BM Cherene Altes, MD   237 mL at 12/27/15 1933  . guaiFENesin (MUCINEX) 12 hr tablet 1,200 mg  1,200 mg Oral BID Waldemar Dickens, MD   1,200 mg at 12/28/15 1333  . heparin injection 5,000 Units  5,000 Units Subcutaneous 3 times per day Lelon Perla, MD   5,000 Units at 12/28/15 1334  . hydrALAZINE (APRESOLINE) injection 5-10 mg  5-10 mg Intravenous Q4H PRN Waldemar Dickens, MD      . hydrocortisone sodium succinate (SOLU-CORTEF) 100 MG injection 100 mg  100 mg Intravenous Q8H Allie Bossier, MD      . insulin aspart (novoLOG) injection 0-20 Units  0-20 Units Subcutaneous TID WC Cherene Altes, MD   7 Units at 12/28/15 1334  . insulin aspart (novoLOG) injection 0-5 Units  0-5 Units Subcutaneous QHS Cherene Altes, MD   3 Units at 12/26/15 2225  . insulin aspart (novoLOG) injection 15 Units  15 Units Subcutaneous TID WC Allie Bossier, MD      . insulin glargine (LANTUS) injection 40 Units  40 Units Subcutaneous QHS Allie Bossier, MD   40 Units at 12/27/15 2300  . ipratropium (ATROVENT) nebulizer solution 0.5 mg  0.5 mg Nebulization TID Allie Bossier, MD   0.5 mg at 12/28/15 1502  . levalbuterol (XOPENEX) nebulizer solution 0.63 mg  0.63 mg Nebulization Q6H PRN Cherene Altes, MD   0.63 mg at 12/28/15 0519  . levalbuterol (XOPENEX) nebulizer solution 0.63 mg  0.63 mg  Nebulization TID Allie Bossier, MD   0.63 mg at 12/28/15 1502  . [START ON 12/29/2015] levofloxacin (LEVAQUIN) IVPB 750 mg  750 mg Intravenous Q48H Kris Mouton, [facility]      . LORazepam (ATIVAN) injection 2-4 mg  2-4 mg Intravenous Q4H PRN Allie Bossier, MD   2 mg at 12/28/15 0359  . ondansetron (ZOFRAN) tablet 4 mg  4 mg Oral Q6H PRN Waldemar Dickens, MD       Or  . ondansetron [facility]) injection 4 mg  4 mg  Intravenous Q6H PRN Waldemar Dickens, MD   4 mg at 12/25/15 2050  . pantoprazole (PROTONIX) EC tablet 40 mg  40 mg Oral BID Cherene Altes, MD   40 mg at 12/28/15 1334  . promethazine (PHENERGAN) injection 12.5 mg  12.5 mg Intravenous Q6H PRN Gardiner Barefoot, NP   12.5 mg at 12/25/15 2208  . simethicone (MYLICON) 40 [id number] suspension 40 mg  40 mg Oral QID PRN Cherene Altes, MD   40 mg at 12/26/15 1152  . sodium chloride flush (NS) 0.9 % injection 3 mL  3 mL Intravenous Q12H Waldemar Dickens, MD   3 mL at 12/28/15 1032     Discharge Medications: Please see discharge summary for a list of discharge medications.  Relevant Imaging Results:  Relevant Lab Results:   Additional Information SSN [id number]  Ross Ludwig, [place]

## 2015-12-28 NOTE — Progress Notes (Signed)
Pharmacy Antibiotic Note  Eric [name] is a 82 y.o. male admitted on 12/25/2015 with COPD exacerbation.  Pharmacy has been consulted for levofloxacin dosing (currently on day 4 of therapy). -WBC= 12.4, afeb, SCr= 2.22 (trend down), CrCl ~ 27  Plan: -Change levaquin to 750mg  IV q48hr -Will follow renal function, cultures and clinical progress   Height: 5\' 9"  (175.3 cm) Weight: 220 lb 3.8 oz (99.9 kg) IBW/kg (Calculated) : 70.7  Temp (24hrs), Avg:97.5 F (36.4 C), Min:96.7 F (35.9 C), Max:97.9 F (36.6 C)   Recent Labs Lab 12/25/15 1226  12/26/15 0252 12/26/15 0541 12/26/15 1145 12/27/15 0440 12/27/15 1607 12/28/15 0300  WBC 13.9*  --  8.8  --   --  15.5*  --  12.4*  CREATININE 2.58*  < > 3.12* 2.96* 2.61* 2.47*  --  2.22*  LATICACIDVEN  --   --   --   --   --   --  1.3  --   < > = values in this interval not displayed.  Estimated Creatinine Clearance: 27.3 mL/min (by C-G formula based on Cr of 2.22).    No Known Allergies  Antimicrobials this admission: Levofloxacin 3/7>>  Microbiology results: Flu neg 3/9 urine- ngtd 3/9 blood-  MRSA PCR- neg  Thank you for asking pharmacy to be involved in the care of this patient. Hildred Laser, Pharm D 12/28/2015 10:44 AM

## 2015-12-28 NOTE — Progress Notes (Signed)
OT Cancellation Note  Patient Details Name: Eric [name] MRN: [id number] DOB: 1927/06/17   Cancelled Treatment:    Reason Eval/Treat Not Completed: Other (comment). Pt given Ativan last night and still only minimally arousable now per nursing and pt still no cardizem drip. Will hold eval until Monday and re-try then.  Almon Register [id number] 12/28/2015, 1:10 PM

## 2015-12-28 NOTE — Progress Notes (Signed)
    Subjective:  Patient apparently confused last PM; received ativan and not responding this AM.   Objective:  Filed Vitals:   12/28/15 0515 12/28/15 0530 12/28/15 0600 12/28/15 0630  BP: 101/65 102/54 90/47 101/48  Pulse: 149 96 98 89  Temp:      TempSrc:      Resp: 24 20 19 24   Height:      Weight:      SpO2: 96% 92% 97% 97%    Intake/Output from previous day:  Intake/Output Summary (Last 24 hours) at 12/28/15 0756 Last data filed at 12/28/15 0700  Gross per 24 hour  Intake 1796.54 ml  Output    475 ml  Net 1321.54 ml    Physical Exam: Physical exam: Well-developed chronically ill appearing; sleeping.  Skin is warm and dry.  HEENT is normal.  Neck is supple.  Chest with expiratory wheeze Cardiovascular exam is irregular Abdominal exam nontender; mildly distended. No masses palpated. Extremities show no edema. neuro not assessed due to sedation    Lab Results: Basic Metabolic Panel:  Recent Labs  12/27/15 0440 12/28/15 0300  NA 135 137  K 4.2 4.3  CL 106 109  CO2 15* 17*  GLUCOSE 278* 229*  BUN 51* 55*  CREATININE 2.47* 2.22*  CALCIUM 8.5* 8.8*  MG  --  2.2   CBC:  Recent Labs  12/25/15 1226  12/27/15 0440 12/28/15 0300  WBC 13.9*  < > 15.5* 12.4*  NEUTROABS 12.9*  --   --  12.0*  HGB 10.7*  < > 9.3* 9.8*  HCT 32.6*  < > 27.8* 31.3*  MCV 90.8  < > 91.7 92.1  PLT 138*  < > 136* 170  < > = values in this interval not displayed. Cardiac Enzymes:  Recent Labs  12/25/15 1744 12/25/15 2234 12/26/15 0252  TROPONINI 0.11* 0.36* 0.11*     Assessment/Plan:  1 atrial flutter multifocal atrial tachycardia now atrial fibrillation-Patient remains in atrial fibrillation. Continue IV cardizem for rate control; transition to PO when confusion improves. Echo shows normal LV function and normally functioning prosthetic aortic valve; ? Severe MS. TSH normal; needs anticoagulation long term. However, with recent fall and now confusion, he would be  high risk. Continue ASA for now and reassess as outpt. 2 COPD/bronchitis-continue bronchodilators, steroids and antibiotics. Management per primary care. 3 history of aortic valve replacement-normal function on echo. 4 Acute on chronic kidney disease-Renal function unchanged; follow. 5 disease-continue ASA and statin. 6 elevated troponin-likely related to atrial arrhythmias and renal insufficiency. No plans for further ischemia evaluation. 7 AMS-Possibly related to sundowning and benzodiazepines. May need head CT if does not improve.  Kirk Ruths 12/28/2015, 7:56 AM

## 2015-12-28 NOTE — Progress Notes (Signed)
Dr. Sherral Hammers notified of pt coughing after liquids swallowed, and that pt had a difficult time taking pills earlier. Also relayed family concern that patient has been coughing a lot at home, and had a choking episode while eating 2 days prior to admission. Orders received. Will continue to monitor pt closely.

## 2015-12-29 DIAGNOSIS — R0600 Dyspnea, unspecified: Secondary | ICD-10-CM

## 2015-12-29 DIAGNOSIS — N179 Acute kidney failure, unspecified: Secondary | ICD-10-CM

## 2015-12-29 DIAGNOSIS — I2510 Atherosclerotic heart disease of native coronary artery without angina pectoris: Secondary | ICD-10-CM

## 2015-12-29 DIAGNOSIS — N189 Chronic kidney disease, unspecified: Secondary | ICD-10-CM

## 2015-12-29 DIAGNOSIS — I9589 Other hypotension: Secondary | ICD-10-CM

## 2015-12-29 LAB — GLUCOSE, CAPILLARY
GLUCOSE-CAPILLARY: 155 mg/dL — AB (ref 65–99)
GLUCOSE-CAPILLARY: 164 mg/dL — AB (ref 65–99)
Glucose-Capillary: 169 mg/dL — ABNORMAL HIGH (ref 65–99)
Glucose-Capillary: 185 mg/dL — ABNORMAL HIGH (ref 65–99)
Glucose-Capillary: 249 mg/dL — ABNORMAL HIGH (ref 65–99)

## 2015-12-29 LAB — URINE CULTURE: Culture: NO GROWTH

## 2015-12-29 LAB — COMPREHENSIVE METABOLIC PANEL
ALT: 34 U/L (ref 17–63)
AST: 20 U/L (ref 15–41)
Albumin: 2.8 g/dL — ABNORMAL LOW (ref 3.5–5.0)
Alkaline Phosphatase: 46 U/L (ref 38–126)
Anion gap: 9 (ref 5–15)
BUN: 52 mg/dL — ABNORMAL HIGH (ref 6–20)
CO2: 18 mmol/L — ABNORMAL LOW (ref 22–32)
Calcium: 8.8 mg/dL — ABNORMAL LOW (ref 8.9–10.3)
Chloride: 115 mmol/L — ABNORMAL HIGH (ref 101–111)
Creatinine, Ser: 2.1 mg/dL — ABNORMAL HIGH (ref 0.61–1.24)
GFR calc Af Amer: 31 mL/min — ABNORMAL LOW (ref >60)
GFR calc non Af Amer: 27 mL/min — ABNORMAL LOW (ref >60)
Glucose, Bld: 144 mg/dL — ABNORMAL HIGH (ref 65–99)
Potassium: 4 mmol/L (ref 3.5–5.1)
Sodium: 142 mmol/L (ref 135–145)
Total Bilirubin: 0.6 mg/dL (ref 0.3–1.2)
Total Protein: 5.7 g/dL — ABNORMAL LOW (ref 6.5–8.1)

## 2015-12-29 LAB — CBC WITH DIFFERENTIAL/PLATELET
Basophils Absolute: 0 10*3/uL (ref 0.0–0.1)
Basophils Relative: 0 %
Eosinophils Absolute: 0 10*3/uL (ref 0.0–0.7)
Eosinophils Relative: 0 %
HCT: 28.4 % — ABNORMAL LOW (ref 39.0–52.0)
Hemoglobin: 9.5 g/dL — ABNORMAL LOW (ref 13.0–17.0)
Lymphocytes Relative: 2 %
Lymphs Abs: 0.3 10*3/uL — ABNORMAL LOW (ref 0.7–4.0)
MCH: 30.6 pg (ref 26.0–34.0)
MCHC: 33.5 g/dL (ref 30.0–36.0)
MCV: 91.6 fL (ref 78.0–100.0)
Monocytes Absolute: 0.2 10*3/uL (ref 0.1–1.0)
Monocytes Relative: 2 %
Neutro Abs: 12.6 10*3/uL — ABNORMAL HIGH (ref 1.7–7.7)
Neutrophils Relative %: 96 %
Platelets: 193 10*3/uL (ref 150–400)
RBC: 3.1 MIL/uL — ABNORMAL LOW (ref 4.22–5.81)
RDW: 14.8 % (ref 11.5–15.5)
WBC: 13.2 10*3/uL — ABNORMAL HIGH (ref 4.0–10.5)

## 2015-12-29 LAB — MAGNESIUM: MAGNESIUM: 2.1 mg/dL (ref 1.7–2.4)

## 2015-12-29 MED ORDER — RESOURCE THICKENUP CLEAR PO POWD
ORAL | Status: DC | PRN
  Filled 2015-12-29: qty 125

## 2015-12-29 NOTE — Progress Notes (Signed)
Two person assist to get OOB to chair. Patient not comfortable sitting in chair, so 2 person assist to get back to bed. Patient not able to walk and once standing not easy for him to step. Buttocks sticking out behind him before legs got him to bed. Very unsteady on feet. High fall risk remains in place. PT needs to work with patient. Bed alarm on and patient resting comfortably in bed. Patient stated he sleeps all day at home. His bedtime is sleeping from 4:30 am til about 5:00pm. "I stay in bed all day." Patient not safe for discharge home.

## 2015-12-29 NOTE — Progress Notes (Signed)
[place] TEAM 1 - Stepdown/ICU TEAM Progress Note  Eric [name]:[id number] DOB: 1927/02/10 DOA: 12/25/2015 PCP: [name], MD  Admit HPI / Brief Narrative: 82 y.o. WM PMHx  CVA, CAD native artery S/P CABG 2010, S/P bioprosthetic AV replacement 2010, HTN, HLD, DM Type 2, Bilateral hydrocele, C-spine Foraminal Stenosis  c/o a 2 day history of generalized weakness which lead to him falling out of bed 2 days prior. He developed hip pain from this fall. He also noted the new onset wheezing and coughing with occasional phlegm production, and a runny nose w/ upper respiratory congestion. He also noted a very poor appetite with very little oral intake.   He came to the ER and was found to be tachycardic (had P waves - was irregular - possible MAT). He was treated with nebs with improvement of resp failure and tightness. His HR continued to climb to 135. He was given Cardizem 20 mg with BP dropping after. He was given a fluid bolus, and a dilt drip at 5 mg was begun.    HPI/Subjective: 3/11  A/O  4 cracking jokes with family and staff, patient sedated   Assessment/Plan: Sepsis unspecified organism/ Acute COPD exacerbation  -On admission patient met guidelines for sepsis HR> 90, RR> 20, WBC> 12 -No infiltrate on CXR - Continue current antibiotic complete 7 day course -Continue normal saline 100 ml/hr - Atrovent QID, Xopenex PRN -Stress dose steroids Solu-Cortef 440 TID  Metabolic acidosis with anion gap -Possibly multifactorial to include uremia, DKA? -Anion gap resolved, bicarbonate remains low -Urinalysis negative ketones -Urine culture NGTD   Dilated Cardiomyopathy -Strict in and out since admission + 7 L -Daily weight admission = 95.2 kg           3/11 bed weight= 101 kg -Echocardiogram; essentially normal see results below -Transfuse for hemoglobin<8  Tachyarrhythmia - Sinus Tachy w/ non-conducted PACs v/s MAT/A. fib RVR -Cardiology following -Currently patient  in A. fib RVR, still only partially controlled - Cardizem infusion -Now with NG tube if still poorly controlled in A.m. start Digoxin. Would not use Amiodarone in patient with COPD exacerbation  Pulmonary hypertension -See dilated cardiomyopathy/tachyarrhythmia  Coronary artery disease status post CABG x3 2010 -Cont ASA + lipid med - denies cp   Aortic stenosis status post valve replacement 2010 -Pericardial tissue valve   Carotid artery stenosis  Dopplers May 2015 showed a 60-79% right and 1-39% left stenosis  HTN/Hypotension -HTN Not an active issue at this time, in fact patient is borderline hypotensive  -TSH WNL -See sepsis  DM Type2 - acutely severely uncontrolled -3/7 Hemoglobin A1c =7.1 -Increase Lantus to 40 QHS held, secondary to nothing by mouth -Increase NovoLog to 15 units with meals held secondary to nothing by mouth -Resistant SSI  N/V -KUB noted gaseous distention of stomach on BIPAP -Resolved  -Dysphagia 1 diet nectar thick   Acute on CKD stage III (Baseline creatinine 1.6)  - creatinine peak of 3.12-->> 2.2 - Cr continues to trend down with hydration  - avoid ACE/ARB   Hyponatremia -Resolved with hydration    Hyperlipidemia -Restart Zocor 40 mg daily   GERD  Acute mental status change -Multifactorial to include sepsis, metabolic acidosis, iatrogenic (Ativan)+ acute on chronic renal failure, and CVA? -Obtain CT head  Goals of care -Patient be out of bed, ambulate as possible q shift in order to build strength for discharged to SNF    Code Status: DO NOT RESUSCITATE  Family Communication: no family present at  time of exam Disposition Plan: SNF Resolution sepsis/altered mental status    Consultants: Dr. Lelon Perla [facility]   Procedure/Significant Events: 3/9 echocardiogram;LVEF=60%-65%. -- Aortic valve: A bioprosthesis was present and functioning  normally. - Mitral valve: severe stenosis. Mean gradient (D): 11 mm Hg. - Right  ventricle:  size was normal. Wall thickness normal. Systolic function was normal. - Tricuspid valve: trivial regurgitation.   Culture 3/7 influenza A/B/H1N1 negative 3/7 MRSA negative 3/9 urine NGTD  3/9 blood left  AC/hand NGTD    Antibiotics: Levofloxacin 3/7>  DVT prophylaxis: Subcutaneous heparin   Devices    LINES / TUBES:      Continuous Infusions: . sodium chloride 100 mL/hr at 12/29/15 0125  . diltiazem (CARDIZEM) infusion 5 mg/hr (12/29/15 0125)    Objective: VITAL SIGNS: Temp: 98.2 F (36.8 C) (03/11 0400) Temp Source: Oral (03/11 0400) BP: 129/76 mmHg (03/11 0400) Pulse Rate: 118 (03/10 2300) SPO2; FIO2:   Intake/Output Summary (Last 24 hours) at 12/29/15 0818 Last data filed at 12/29/15 0600  Gross per 24 hour  Intake   2040 ml  Output   1150 ml  Net    890 ml     Exam: General: A/O 4, joking with family and staff, NAD, No acute respiratory distress Eyes:  negative scleral hemorrhage ENT: Negative Runny nose, negative gingival bleeding, Neck:  Negative scars, masses, torticollis, lymphadenopathy, JVD Lungs: Mild expiratory wheezing diffusely, negative crackles Cardiovascular: Irregular irregular rhythm and rate, without murmur gallop or rub normal S1 and S2 Abdomen:negative abdominal pain, nondistended, positive soft, bowel sounds, no rebound, no ascites, no appreciable mass Extremities: No significant cyanosis, clubbing, or edema bilateral lower extremities Psychiatric:  Negative depression, negative anxiety, negative fatigue, negative mania  Neurologic:  Cranial nerves II through XII intact, tongue/uvula midline, all extremities muscle strength 5/5, sensation intact throughout, Positive dysarthria (chronic), negative expressive aphasia, negative receptive aphasia.    Data Reviewed: Basic Metabolic Panel:  Recent Labs Lab 12/26/15 0541 12/26/15 1145 12/27/15 0440 12/28/15 0300 12/29/15 0243  NA 134* 133* 135 137 142  K 4.0 3.5  4.2 4.3 4.0  CL 102 105 106 109 115*  CO2 17* 18* 15* 17* 18*  GLUCOSE 490* 176* 278* 229* 144*  BUN 45* 45* 51* 55* 52*  CREATININE 2.96* 2.61* 2.47* 2.22* 2.10*  CALCIUM 8.4* 8.4* 8.5* 8.8* 8.8*  MG  --   --   --  2.2 2.1   Liver Function Tests:  Recent Labs Lab 12/25/15 1226 12/27/15 0440 12/28/15 0300 12/29/15 0243  AST 54* 54* 39 20  ALT 29 40 43 34  ALKPHOS 60 53 53 46  BILITOT 1.6* 0.8 0.7 0.6  PROT 6.8 6.0* 6.0* 5.7*  ALBUMIN 3.0* 2.6* 2.7* 2.8*   No results for input(s): LIPASE, AMYLASE in the last 168 hours. No results for input(s): AMMONIA in the last 168 hours. CBC:  Recent Labs Lab 12/25/15 1226 12/26/15 0050 12/26/15 0252 12/27/15 0440 12/28/15 0300 12/29/15 0243  WBC 13.9*  --  8.8 15.5* 12.4* 13.2*  NEUTROABS 12.9*  --   --   --  12.0* 12.6*  HGB 10.7* 9.6* 9.4* 9.3* 9.8* 9.5*  HCT 32.6* 28.4* 29.2* 27.8* 31.3* 28.4*  MCV 90.8  --  92.1 91.7 92.1 91.6  PLT 138*  --  135* 136* 170 193   Cardiac Enzymes:  Recent Labs Lab 12/25/15 1744 12/25/15 2234 12/26/15 0252  TROPONINI 0.11* 0.36* 0.11*   BNP (last 3 results) No results for input(s): BNP in  the last 8760 hours.  ProBNP (last 3 results) No results for input(s): PROBNP in the last 8760 hours.  CBG:  Recent Labs Lab 12/28/15 1244 12/28/15 1807 12/28/15 1958 12/28/15 2331 12/29/15 0606  GLUCAP 238* 201* 217* 169* 155*    Recent Results (from the past 240 hour(s))  MRSA PCR Screening     Status: None   Collection Time: 12/25/15 10:28 PM  Result Value Ref Range Status   MRSA by PCR NEGATIVE NEGATIVE Final    Comment:        The GeneXpert MRSA Assay (FDA approved for NASAL specimens only), is one component of a comprehensive MRSA colonization surveillance program. It is not intended to diagnose MRSA infection nor to guide or monitor treatment for MRSA infections.   Culture, blood (routine x 2)     Status: None (Preliminary result)   Collection Time: 12/27/15  4:09 PM    Result Value Ref Range Status   Specimen Description BLOOD LEFT ANTECUBITAL  Final   Special Requests IN PEDIATRIC BOTTLE 3CC  Final   Culture NO GROWTH < 24 HOURS  Final   Report Status PENDING  Incomplete  Culture, blood (routine x 2)     Status: None (Preliminary result)   Collection Time: 12/27/15  4:15 PM  Result Value Ref Range Status   Specimen Description BLOOD LEFT HAND  Final   Special Requests IN PEDIATRIC BOTTLE 3CC  Final   Culture NO GROWTH < 24 HOURS  Final   Report Status PENDING  Incomplete  Culture, Urine     Status: None (Preliminary result)   Collection Time: 12/27/15  6:32 PM  Result Value Ref Range Status   Specimen Description URINE, RANDOM  Final   Special Requests NONE  Final   Culture NO GROWTH < 24 HOURS  Final   Report Status PENDING  Incomplete     Studies:  Recent x-ray studies have been reviewed in detail by the Attending Physician  Scheduled Meds:  Scheduled Meds: . antiseptic oral rinse  7 mL Mouth Rinse BID  . aspirin  300 mg Rectal Daily  . heparin subcutaneous  5,000 Units Subcutaneous 3 times per day  . hydrocortisone sod succinate (SOLU-CORTEF) inj  100 mg Intravenous Q8H  . insulin aspart  0-20 Units Subcutaneous 6 times per day  . ipratropium  0.5 mg Nebulization TID  . levalbuterol  0.63 mg Nebulization TID  . levofloxacin (LEVAQUIN) IV  750 mg Intravenous Q48H  . pantoprazole (PROTONIX) IV  40 mg Intravenous Q12H  . sodium chloride flush  3 mL Intravenous Q12H    Time spent on care of this patient: 40 mins   WOODS, Geraldo Docker , MD  [facility] Office  [phone] Pager - [phone]  On-Call/Text Page:      [email or web address]      password [email or web address]  If 7PM-7AM, please contact night-coverage [email or web address] Password [email or web address] 12/29/2015, 8:18 AM   LOS: 4 days   Care during the described time interval was provided by me .  I have reviewed this patient's available data, including medical history, events of note, physical  examination, and all test results as part of my evaluation. I have personally reviewed and interpreted all radiology studies.   Dia Crawford, MD [phone] Pager

## 2015-12-29 NOTE — Progress Notes (Signed)
    Subjective:  The patient still appears confused.   Objective:  Filed Vitals:   12/29/15 0500 12/29/15 0824 12/29/15 0928 12/29/15 1159  BP:    107/62  Pulse:   107 105  Temp:  97.6 F (36.4 C)  97.8 F (36.6 C)  TempSrc:  Oral  Oral  Resp: 17  27 24   Height:      Weight: 222 lb 14.2 oz (101.1 kg)     SpO2:   100% 99%   Intake/Output from previous day:  Intake/Output Summary (Last 24 hours) at 12/29/15 1208 Last data filed at 12/29/15 0600  Gross per 24 hour  Intake   1810 ml  Output    950 ml  Net    860 ml   Physical Exam: Physical exam: Well-developed chronically ill appearing; sleeping.  Skin is warm and dry.  HEENT is normal.  Neck is supple.  Chest with expiratory wheeze Cardiovascular exam is irregular Abdominal exam nontender; mildly distended. No masses palpated. Extremities show no edema. neuro not assessed due to sedation  Lab Results: Basic Metabolic Panel:  Recent Labs  12/28/15 0300 12/29/15 0243  NA 137 142  K 4.3 4.0  CL 109 115*  CO2 17* 18*  GLUCOSE 229* 144*  BUN 55* 52*  CREATININE 2.22* 2.10*  CALCIUM 8.8* 8.8*  MG 2.2 2.1   CBC:  Recent Labs  12/28/15 0300 12/29/15 0243  WBC 12.4* 13.2*  NEUTROABS 12.0* 12.6*  HGB 9.8* 9.5*  HCT 31.3* 28.4*  MCV 92.1 91.6  PLT 170 193   Telemetry: atrial fibrillation with ventricular rate 110 /minute   Assessment/Plan:   1 atrial flutter multifocal atrial tachycardia now atrial fibrillation-Patient remains in atrial fibrillation with RVR. Continue IV cardizem for rate control; transition to PO when confusion improves. Echo shows normal LV function and normally functioning prosthetic aortic valve; ? Severe MS. TSH normal; needs anticoagulation long term. However, with recent fall and now confusion, he would be high risk. Continue ASA for now and reassess as outpt. Not a good candidate for amiodarone given underlying lung problem.  2 COPD/bronchitis-continue bronchodilators, steroids  and antibiotics. Management per primary care. 3 history of aortic valve replacement-normal function on echo. 4 Acute on chronic kidney disease-Renal function unchanged; follow. 5 disease-continue ASA and statin. 6 elevated troponin-likely related to atrial arrhythmias and renal insufficiency. No plans for further ischemia evaluation. 7 AMS-Possibly related to sundowning and benzodiazepines. May need head CT if does not improve.  Dorothy Spark 12/29/2015, 12:08 PM

## 2015-12-29 NOTE — Evaluation (Signed)
Clinical/Bedside Swallow Evaluation Patient Details  Name: Eric [name] MRN: [id number] Date of Birth: 06/19/1927  Today's Date: 12/29/2015 Time: SLP Start Time (ACUTE ONLY): 0820 SLP Stop Time (ACUTE ONLY): 0848 SLP Time Calculation (min) (ACUTE ONLY): 28 min  Past Medical History:  Past Medical History  Diagnosis Date  . Cerebrovascular disease, unspecified   . Coronary atherosclerosis of unspecified type of vessel, native or graft     s/p CABG 2010  . Esophageal reflux   . Pure hypercholesterolemia   . Essential hypertension, benign   . Type II or unspecified type diabetes mellitus with neurological manifestations, not stated as uncontrolled   . Bilateral hydrocele 3/11    [facility]  . History of aortic stenosis     s/p valve replacment 2010  . Cervicalgia   . Foraminal stenosis of cervical region     bilateral-C4-C5, C5-C6  . Colon polyp   . Shingles 07/27/2012   Past Surgical History:  Past Surgical History  Procedure Laterality Date  . Cataract extraction  1990's    bilateral  . Cholecystectomy  1995  . Colonoscopy  1997    Mult. polyps- [facility]  . Sphincterectomy  09/20/03    for jaundice  . Ptca  12/99    with stent  . Coronary artery bypass graft  3/10    x3-using a left internal mammary artery to the left anterior descending coronary artery, saphenous vein graft to circumflex marginal branch, spahenous vein graft  to posterior descendingcoronary artery. Endoscopic saphenous vein harvest from bilateral thighs was done.   . Aortic valve replacement  12/2008    with pericardial tissue valve  . Carotid US  10/2009    B ICA stenosis, stable disease, rec rpt 2 years  . Hydrocele excision      bilateral ([facility])  . L leg trauma  1957    truck over left leg  . Total hip arthroplasty Left 06/30/2014    Procedure: LEFT TOTAL HIP ARTHROPLASTY ANTERIOR APPROACH;  Surgeon: Mcarthur Rossetti, MD;  Location: [facility];  Service: Orthopedics;  Laterality:  Left;   HPI:  82 y.o. WM PMHx CVA, CAD native artery S/P CABG 2010, S/P bioprosthetic AV replacement 2010, HTN, HLD, DM Type 2, Bilateral hydrocele, C-spine Foraminal Stenosis. c/o a 2 day history of generalized weakness which lead to him falling out of bed 2 days prior. He developed hip pain from this fall. He also noted the new onset wheezing and coughing with occasional phlegm production, and a runny nose w/ upper respiratory congestion. He also noted a very poor appetite with very little oral intake. Found to be septic with acute COPD exacerbation. Pt observed to cough at home with PO and after admission. CXR on 3/7 clear   Assessment / Plan / Recommendation Clinical Impression  Pt demonstrates baseline coughing making subjective observation of signs of aspiration difficult to recognize. Pt did have immediate coughing with thin liquids and suspected delayed swallow concerning for penetration/aspiration. Trials of nectar thick liquids with total assist to control bolus size eliminated any immediate signs of aspiration, though intermittent coughing still observed, as with puree. Pt unable to masticate egg and requested to spit it out. Recommend pt initiate a Dys 1 (puree) diet with nectar thick liquids with full supervision. Given AMS and occasional increased WOB would not recommend objective testing today, though it may be beneficial as pt improves. Will f/u for tolerance.      Aspiration Risk  Moderate aspiration risk  Diet Recommendation Dysphagia 1 (Puree);Nectar-thick liquid   Liquid Administration via: Cup;Straw Medication Administration: Crushed with puree Supervision: Full supervision/cueing for compensatory strategies Compensations: Slow rate;Small sips/bites Postural Changes: Seated upright at 90 degrees;Remain upright for at least 30 minutes after po intake    Other  Recommendations Oral Care Recommendations: Oral care BID   Follow up Recommendations  Skilled Nursing facility     Frequency and Duration min 2x/week  2 weeks       Prognosis Prognosis for Safe Diet Advancement: Fair Barriers to Reach Goals: Cognitive deficits      Swallow Study   General HPI: 82 y.o. WM PMHx CVA, CAD native artery S/P CABG 2010, S/P bioprosthetic AV replacement 2010, HTN, HLD, DM Type 2, Bilateral hydrocele, C-spine Foraminal Stenosis. c/o a 2 day history of generalized weakness which lead to him falling out of bed 2 days prior. He developed hip pain from this fall. He also noted the new onset wheezing and coughing with occasional phlegm production, and a runny nose w/ upper respiratory congestion. He also noted a very poor appetite with very little oral intake. Found to be septic with acute COPD exacerbation. Pt observed to cough at home with PO and after admission. CXR on 3/7 clear Type of Study: Bedside Swallow Evaluation Previous Swallow Assessment: none Diet Prior to this Study: NPO Temperature Spikes Noted: No Respiratory Status: Nasal cannula History of Recent Intubation: No Behavior/Cognition: Alert;Cooperative;Pleasant mood Oral Cavity - Dentition: Edentulous Vision: Functional for self-feeding Self-Feeding Abilities: Total assist Patient Positioning: Upright in bed Baseline Vocal Quality: Hoarse Volitional Cough: Congested;Strong Volitional Swallow: Unable to elicit    Oral/Motor/Sensory Function Overall Oral Motor/Sensory Function: Within functional limits   Ice Chips     Thin Liquid Thin Liquid: Impaired Presentation: Cup;Straw;Self Fed Pharyngeal  Phase Impairments: Suspected delayed Swallow;Cough - Immediate    Nectar Thick Nectar Thick Liquid: Impaired Presentation: Straw Pharyngeal Phase Impairments: Suspected delayed Swallow   Honey Thick Honey Thick Liquid: Not tested   Puree Puree: Impaired Presentation: Spoon Oral Phase Functional Implications: Prolonged oral transit Pharyngeal Phase Impairments: Suspected delayed Swallow;Cough - Delayed    Solid   GO   Solid: Impaired Presentation: Spoon Oral Phase Impairments: Impaired mastication Oral Phase Functional Implications: Prolonged oral transit;Impaired mastication       [name], MA CCC-SLP [phone]  Lynann Beaver 12/29/2015,8:53 AM

## 2015-12-30 DIAGNOSIS — I959 Hypotension, unspecified: Secondary | ICD-10-CM | POA: Diagnosis present

## 2015-12-30 DIAGNOSIS — R410 Disorientation, unspecified: Secondary | ICD-10-CM | POA: Diagnosis present

## 2015-12-30 LAB — GLUCOSE, CAPILLARY
GLUCOSE-CAPILLARY: 121 mg/dL — AB (ref 65–99)
GLUCOSE-CAPILLARY: 147 mg/dL — AB (ref 65–99)
GLUCOSE-CAPILLARY: 148 mg/dL — AB (ref 65–99)
GLUCOSE-CAPILLARY: 158 mg/dL — AB (ref 65–99)
GLUCOSE-CAPILLARY: 171 mg/dL — AB (ref 65–99)
Glucose-Capillary: 113 mg/dL — ABNORMAL HIGH (ref 65–99)
Glucose-Capillary: 139 mg/dL — ABNORMAL HIGH (ref 65–99)
Glucose-Capillary: 156 mg/dL — ABNORMAL HIGH (ref 65–99)
Glucose-Capillary: 176 mg/dL — ABNORMAL HIGH (ref 65–99)

## 2015-12-30 LAB — CBC WITH DIFFERENTIAL/PLATELET
BASOS ABS: 0 10*3/uL (ref 0.0–0.1)
Basophils Relative: 0 %
EOS PCT: 0 %
Eosinophils Absolute: 0 10*3/uL (ref 0.0–0.7)
HEMATOCRIT: 28.8 % — AB (ref 39.0–52.0)
Hemoglobin: 9.2 g/dL — ABNORMAL LOW (ref 13.0–17.0)
LYMPHS PCT: 4 %
Lymphs Abs: 0.5 10*3/uL — ABNORMAL LOW (ref 0.7–4.0)
MCH: 29.7 pg (ref 26.0–34.0)
MCHC: 31.9 g/dL (ref 30.0–36.0)
MCV: 92.9 fL (ref 78.0–100.0)
Monocytes Absolute: 0.2 10*3/uL (ref 0.1–1.0)
Monocytes Relative: 2 %
NEUTROS ABS: 12.3 10*3/uL — AB (ref 1.7–7.7)
NEUTROS PCT: 94 %
PLATELETS: 228 10*3/uL (ref 150–400)
RBC: 3.1 MIL/uL — AB (ref 4.22–5.81)
RDW: 15.1 % (ref 11.5–15.5)
WBC: 13 10*3/uL — AB (ref 4.0–10.5)

## 2015-12-30 LAB — COMPREHENSIVE METABOLIC PANEL
ALT: 28 U/L (ref 17–63)
ANION GAP: 9 (ref 5–15)
AST: 17 U/L (ref 15–41)
Albumin: 2.6 g/dL — ABNORMAL LOW (ref 3.5–5.0)
Alkaline Phosphatase: 43 U/L (ref 38–126)
BILIRUBIN TOTAL: 0.7 mg/dL (ref 0.3–1.2)
BUN: 39 mg/dL — ABNORMAL HIGH (ref 6–20)
CO2: 21 mmol/L — ABNORMAL LOW (ref 22–32)
Calcium: 8.5 mg/dL — ABNORMAL LOW (ref 8.9–10.3)
Chloride: 113 mmol/L — ABNORMAL HIGH (ref 101–111)
Creatinine, Ser: 1.92 mg/dL — ABNORMAL HIGH (ref 0.61–1.24)
GFR, EST AFRICAN AMERICAN: 34 mL/min — AB (ref >60)
GFR, EST NON AFRICAN AMERICAN: 30 mL/min — AB (ref >60)
Glucose, Bld: 158 mg/dL — ABNORMAL HIGH (ref 65–99)
POTASSIUM: 3.8 mmol/L (ref 3.5–5.1)
Sodium: 143 mmol/L (ref 135–145)
TOTAL PROTEIN: 5.5 g/dL — AB (ref 6.5–8.1)

## 2015-12-30 LAB — MAGNESIUM: Magnesium: 1.8 mg/dL (ref 1.7–2.4)

## 2015-12-30 MED ORDER — HYDROCORTISONE NA SUCCINATE PF 100 MG IJ SOLR
50.0000 mg | Freq: Three times a day (TID) | INTRAMUSCULAR | Status: DC
  Administered 2015-12-31: 50 mg via INTRAVENOUS
  Filled 2015-12-30: qty 2

## 2015-12-30 MED ORDER — DIGOXIN 125 MCG PO TABS
0.0625 mg | ORAL_TABLET | Freq: Every day | ORAL | Status: DC
  Administered 2015-12-30: 0.0625 mg via ORAL
  Filled 2015-12-30: qty 1

## 2015-12-30 MED ORDER — SIMVASTATIN 40 MG PO TABS
40.0000 mg | ORAL_TABLET | Freq: Every day | ORAL | Status: DC
  Administered 2015-12-30 – 2015-12-31 (×2): 40 mg via ORAL
  Filled 2015-12-30 (×2): qty 1

## 2015-12-30 MED ORDER — DIGOXIN 125 MCG PO TABS
0.1250 mg | ORAL_TABLET | Freq: Every day | ORAL | Status: DC
  Administered 2015-12-31 – 2016-01-03 (×4): 0.125 mg via ORAL
  Filled 2015-12-30 (×4): qty 1

## 2015-12-30 MED ORDER — ASPIRIN EC 81 MG PO TBEC
81.0000 mg | DELAYED_RELEASE_TABLET | Freq: Every day | ORAL | Status: DC
  Administered 2015-12-30 – 2016-01-06 (×8): 81 mg via ORAL
  Filled 2015-12-30 (×8): qty 1

## 2015-12-30 NOTE — Progress Notes (Signed)
Speech Language Pathology Treatment: Dysphagia  Patient Details Name: Eric [name] MRN: [id number] DOB: [birth year] Today's Date: 12/30/2015 Time: [id number] SLP Time Calculation (min) (ACUTE ONLY): 11 min  Assessment / Plan / Recommendation Clinical Impression  Slp arrived to check pts tolerance of modified diet started yesterday. Unfortunately pt has had very little intake. He is coughing at baseline and struggled with coughing following pureed solids on his breakfast ray per NT and RN. SLP observed pt to have delayed coughing with nectar thick liquids and puree. Recommend pt remain NPO today with MBS tomorrow for objective assessment of swallowing. Pt may continue to take oral meds with puree. He is agreeable to no PO intake, barely wants anything anyway he says.    HPI HPI: 82 y.o. WM PMHx CVA, CAD native artery S/P CABG 2010, S/P bioprosthetic AV replacement 2010, HTN, HLD, DM Type 2, Bilateral hydrocele, C-spine Foraminal Stenosis. c/o a 2 day history of generalized weakness which lead to him falling out of bed 2 days prior. He developed hip pain from this fall. He also noted the new onset wheezing and coughing with occasional phlegm production, and a runny nose w/ upper respiratory congestion. He also noted a very poor appetite with very little oral intake. Found to be septic with acute COPD exacerbation. Pt observed to cough at home with PO and after admission. CXR on 3/7 clear      SLP Plan  MBS     Recommendations  Diet recommendations: NPO Medication Administration: Whole meds with puree             Plan: MBS     GO               [name], MA CCC-SLP [phone]  Lynann Beaver 12/30/2015, 9:22 AM

## 2015-12-30 NOTE — Progress Notes (Signed)
    Subjective:  The patient still appears confused.   Marland Kitchen antiseptic oral rinse  7 mL Mouth Rinse BID  . aspirin EC  81 mg Oral Daily  . digoxin  0.0625 mg Oral Daily  . heparin subcutaneous  5,000 Units Subcutaneous 3 times per day  . hydrocortisone sod succinate (SOLU-CORTEF) inj  100 mg Intravenous Q8H  . insulin aspart  0-20 Units Subcutaneous 6 times per day  . ipratropium  0.5 mg Nebulization TID  . levalbuterol  0.63 mg Nebulization TID  . levofloxacin (LEVAQUIN) IV  750 mg Intravenous Q48H  . pantoprazole (PROTONIX) IV  40 mg Intravenous Q12H  . simvastatin  40 mg Oral q1800  . sodium chloride flush  3 mL Intravenous Q12H   . sodium chloride 100 mL/hr at 12/30/15 0700  . diltiazem (CARDIZEM) infusion 7.5 mg/hr (12/30/15 0830)    Objective:  Filed Vitals:   12/30/15 0400 12/30/15 0500 12/30/15 0805 12/30/15 0832  BP: 147/80  138/94 138/94  Pulse:   117 119  Temp:   97.3 F (36.3 C)   TempSrc:   Oral   Resp: 23  24 24   Height:      Weight:  223 lb 5.2 oz (101.3 kg)    SpO2:   97% 99%   Intake/Output from previous day:  Intake/Output Summary (Last 24 hours) at 12/30/15 1111 Last data filed at 12/30/15 1100  Gross per 24 hour  Intake 2736.25 ml  Output    350 ml  Net 2386.25 ml   Physical Exam: Physical exam: Well-developed chronically ill appearing; sleeping.  Skin is warm and dry.  HEENT is normal.  Neck is supple.  Chest with expiratory wheeze Cardiovascular exam is irregular Abdominal exam nontender; mildly distended. No masses palpated. Extremities show no edema. neuro not assessed due to sedation  Lab Results: Basic Metabolic Panel:  Recent Labs  12/29/15 0243 12/30/15 0503  NA 142 143  K 4.0 3.8  CL 115* 113*  CO2 18* 21*  GLUCOSE 144* 158*  BUN 52* 39*  CREATININE 2.10* 1.92*  CALCIUM 8.8* 8.5*  MG 2.1 1.8   CBC:  Recent Labs  12/29/15 0243 12/30/15 0503  WBC 13.2* 13.0*  NEUTROABS 12.6* 12.3*  HGB 9.5* 9.2*  HCT 28.4* 28.8*   MCV 91.6 92.9  PLT 193 228   Telemetry: atrial fibrillation with ventricular rate 110 /minute   Assessment/Plan:   1 atrial flutter multifocal atrial tachycardia now atrial fibrillation-Patient remains in atrial fibrillation with RVR. Continue IV cardizem for rate control; increase rate to 10 mg /hr, transition to PO when confusion improves, also he is now NPO until swallow study. Echo shows normal LV function and normally functioning prosthetic aortic valve; ? Severe MS. TSH normal; needs anticoagulation long term. However, with recent fall and now confusion, he would be high risk. Continue ASA for now and reassess as outpt. Not a good candidate for amiodarone given underlying lung problem.  2 COPD/bronchitis-continue bronchodilators, steroids and antibiotics. Management per primary care. 3 history of aortic valve replacement-normal function on echo. 4 Acute on chronic kidney disease-Renal function unchanged; follow. 5 disease-continue ASA and statin. 6 elevated troponin-likely related to atrial arrhythmias and renal insufficiency. No plans for further ischemia evaluation. 7 AMS-Possibly related to sundowning and benzodiazepines. May need head CT if does not improve.  Eric [name] 12/30/2015, 11:11 AM

## 2015-12-30 NOTE — Progress Notes (Signed)
Eric [name] - Stepdown/ICU TEAM Progress Note  Eric [name] DTO:[id number] DOB: Jan 13, 1927 DOA: 12/25/2015 PCP: Eric Bush, MD  Admit HPI / Brief Narrative: 82 y.o. WM PMHx  CVA, CAD native artery S/P CABG 2010, S/P bioprosthetic AV replacement 2010, HTN, HLD, DM Type 2, Bilateral hydrocele, C-spine Foraminal Stenosis  c/o a 2 day history of generalized weakness which lead to him falling out of bed 2 days prior. He developed hip pain from this fall. He also noted the new onset wheezing and coughing with occasional phlegm production, and a runny nose w/ upper respiratory congestion. He also noted a very poor appetite with very little oral intake.   He came to the ER and was found to be tachycardic (had P waves - was irregular - possible MAT). He was treated with nebs with improvement of resp failure and tightness. His HR continued to climb to 135. He was given Cardizem 20 mg with BP dropping after. He was given a fluid bolus, and a dilt drip at 5 mg was begun.    HPI/Subjective: 3/12  A/O  4 cracking jokes with family and staff,  Assessment/Plan: Sepsis unspecified organism/ Acute COPD exacerbation  -On admission patient met guidelines for sepsis HR> 90, RR> 20, WBC> 12 -No infiltrate on CXR - Continue current antibiotic complete 7 day course -Continue normal saline 100 ml/hr - Atrovent QID, Xopenex PRN -3/12 Decrease Stress dose steroids Solu-Cortef 50 TID  Metabolic acidosis with anion gap -Possibly multifactorial to include uremia, DKA? -Anion gap resolved, bicarbonate remains low -Urinalysis negative ketones -Urine culture NGTD  -Improving  Dilated Cardiomyopathy -Strict in and out since admission +8.6 L -Daily weight admission = 95.2 kg           3/12 bed weight= 101.3 kg -Echocardiogram; essentially normal see results below -Transfuse for hemoglobin<8  Tachyarrhythmia - Sinus Tachy w/ non-conducted PACs v/s MAT/A. fib RVR -Cardiology  following -Currently patient in A. fib RVR, still only partially controlled - Cardizem infusion -Continued poor control of rate, with borderline AP, start Digoxin 0.125 mg daily  Pulmonary hypertension -See dilated cardiomyopathy/tachyarrhythmia  Coronary artery disease status post CABG x3 2010 -Cont ASA + lipid med - denies cp   Aortic stenosis status post valve replacement 2010 -Pericardial tissue valve   Carotid artery stenosis  Dopplers May 2015 showed a 60-79% right and [name]-39% left stenosis  HTN/Hypotension -HTN Not an active issue at this time, in fact patient is borderline hypotensive  -TSH WNL -See sepsis  DM Type2 - acutely severely uncontrolled -3/7 Hemoglobin A1c =7.[name] -Lantus to 40 QHS held, secondary to nothing by mouth -NovoLog to 15 units with meals held secondary to nothing by mouth -Resistant SSI  N/V -KUB noted gaseous distention of stomach on BIPAP -Resolved  -Dysphagia [name] diet nectar thick  -3/12 NPO, will have MBS in A.m.    Acute on CKD stage III (Baseline creatinine [name].6)  - creatinine peak of 3.12-->> 2.2 - Cr continues to trend down with hydration  - avoid ACE/ARB   Hyponatremia -Resolved with hydration    Hyperlipidemia -Restart Zocor 40 mg daily   GERD  Acute mental status change -Multifactorial to include sepsis, metabolic acidosis, iatrogenic (Ativan)+ acute on chronic renal failure, and CVA? -Obtain CT head; old infarct nondiagnostic see results below -Back to baseline per daughter  Goals of care -Patient be out of bed, ambulate as possible q shift in order to build strength for discharged to SNF    Code Status: DO  NOT RESUSCITATE  Family Communication: no family present at time of exam Disposition Plan: SNF Resolution sepsis/altered mental status    Consultants: Dr. Lelon [name] [facility]   Procedure/Significant Events: 3/9 echocardiogram;LVEF=60%-65%. -- Aortic valve: A bioprosthesis was present and functioning   normally. - Mitral valve: severe stenosis. Mean gradient (D): 11 mm Hg. - Right ventricle:  size was normal. Wall thickness normal. Systolic function was normal. - Tricuspid valve: trivial regurgitation. 3/10 CT head WO contrast;No acute intracranial abnormality. - Mild cerebral atrophy with chronic microvascular ischemic changes cerebral white matter  -old lacunar infarct in the left cerebellar hemisphere   Culture 3/7 influenza A/B/H1N1 negative 3/7 MRSA negative 3/9 urine NGTD  3/9 blood left  AC/hand NGTD    Antibiotics: Levofloxacin 3/7>  DVT prophylaxis: Subcutaneous heparin   Devices    LINES / TUBES:      Continuous Infusions: . sodium chloride 100 mL/hr at 12/30/15 0648  . diltiazem (CARDIZEM) infusion 5 mg/hr (12/30/15 0000)    Objective: VITAL SIGNS: Temp: 97.3 F (36.3 C) (03/12 0805) Temp Source: Oral (03/12 0805) BP: 138/94 mmHg (03/12 0805) Pulse Rate: 117 (03/12 0805) SPO2; FIO2:   Intake/Output Summary (Last 24 hours) at 12/30/15 0827 Last data filed at 12/30/15 0400  Gross per 24 hour  Intake   2570 ml  Output      0 ml  Net   2570 ml     Exam: General: A/O 4, joking with family and staff, NAD, No acute respiratory distress Eyes:  negative scleral hemorrhage ENT: Negative Runny nose, negative gingival bleeding, Neck:  Negative scars, masses, torticollis, lymphadenopathy, JVD Lungs: Mild expiratory wheezing diffusely, negative crackles Cardiovascular: Irregular irregular rhythm and rate, without murmur gallop or rub normal S1 and S2 Abdomen:negative abdominal pain, nondistended, positive soft, bowel sounds, no rebound, no ascites, no appreciable mass Extremities: No significant cyanosis, clubbing, or edema bilateral lower extremities Psychiatric:  Negative depression, negative anxiety, negative fatigue, negative mania  Neurologic:  Cranial nerves II through XII intact, tongue/uvula midline, all extremities muscle strength 5/5,  sensation intact throughout, Positive dysarthria (chronic), negative expressive aphasia, negative receptive aphasia.    Data Reviewed: Basic Metabolic Panel:  Recent Labs Lab 12/26/15 1145 12/27/15 0440 12/28/15 0300 12/29/15 0243 12/30/15 0503  NA 133* 135 137 142 143  K 3.5 4.2 4.3 4.0 3.8  CL 105 106 109 115* 113*  CO2 18* 15* 17* 18* 21*  GLUCOSE 176* 278* 229* 144* 158*  BUN 45* 51* 55* 52* 39*  CREATININE 2.61* 2.47* 2.22* 2.10* [name].92*  CALCIUM 8.4* 8.5* 8.8* 8.8* 8.5*  MG  --   --  2.2 2.[name] [name].8   Liver Function Tests:  Recent Labs Lab 12/25/15 1226 12/27/15 0440 12/28/15 0300 12/29/15 0243 12/30/15 0503  AST 54* 54* 39 20 17  ALT 29 40 43 34 28  ALKPHOS 60 53 53 46 43  BILITOT [name].6* 0.8 0.7 0.6 0.7  PROT 6.8 6.0* 6.0* 5.7* 5.5*  ALBUMIN 3.0* 2.6* 2.7* 2.8* 2.6*   No results for input(s): LIPASE, AMYLASE in the last 168 hours. No results for input(s): AMMONIA in the last 168 hours. CBC:  Recent Labs Lab 12/25/15 1226  12/26/15 0252 12/27/15 0440 12/28/15 0300 12/29/15 0243 12/30/15 0503  WBC 13.9*  --  8.8 15.5* 12.4* 13.2* 13.0*  NEUTROABS 12.9*  --   --   --  12.0* 12.6* 12.3*  HGB 10.7*  < > 9.4* 9.3* 9.8* 9.5* 9.2*  HCT 32.6*  < > 29.2*  27.8* 31.3* 28.4* 28.8*  MCV 90.8  --  92.[name] 91.7 92.[name] 91.6 92.9  PLT 138*  --  135* 136* 170 193 228  < > = values in this interval not displayed. Cardiac Enzymes:  Recent Labs Lab 12/25/15 1744 12/25/15 2234 12/26/15 0252  TROPONINI 0.11* 0.36* 0.11*   BNP (last 3 results) No results for input(s): BNP in the last 8760 hours.  ProBNP (last 3 results) No results for input(s): PROBNP in the last 8760 hours.  CBG:  Recent Labs Lab 12/29/15 2010 12/29/15 2358 12/30/15 0011 12/30/15 0235 12/30/15 0811  GLUCAP 176* 121* 113* 158* 139*    Recent Results (from the past 240 hour(s))  MRSA PCR Screening     Status: None   Collection Time: 12/25/15 10:28 PM  Result Value Ref Range Status   MRSA by  PCR NEGATIVE NEGATIVE Final    Comment:        The GeneXpert MRSA Assay (FDA approved for NASAL specimens only), is one component of a comprehensive MRSA colonization surveillance program. It is not intended to diagnose MRSA infection nor to guide or monitor treatment for MRSA infections.   Culture, blood (routine x 2)     Status: None (Preliminary result)   Collection Time: 12/27/15  4:09 PM  Result Value Ref Range Status   Specimen Description BLOOD LEFT ANTECUBITAL  Final   Special Requests IN PEDIATRIC BOTTLE 3CC  Final   Culture NO GROWTH 2 DAYS  Final   Report Status PENDING  Incomplete  Culture, blood (routine x 2)     Status: None (Preliminary result)   Collection Time: 12/27/15  4:15 PM  Result Value Ref Range Status   Specimen Description BLOOD LEFT HAND  Final   Special Requests IN PEDIATRIC BOTTLE 3CC  Final   Culture NO GROWTH 2 DAYS  Final   Report Status PENDING  Incomplete  Culture, Urine     Status: None   Collection Time: 12/27/15  6:32 PM  Result Value Ref Range Status   Specimen Description URINE, RANDOM  Final   Special Requests NONE  Final   Culture NO GROWTH 2 DAYS  Final   Report Status 12/29/2015 FINAL  Final     Studies:  Recent x-ray studies have been reviewed in detail by the Attending Physician  Scheduled Meds:  Scheduled Meds: . antiseptic oral rinse  7 mL Mouth Rinse BID  . aspirin  300 mg Rectal Daily  . heparin subcutaneous  5,000 Units Subcutaneous 3 times per day  . hydrocortisone sod succinate (SOLU-CORTEF) inj  100 mg Intravenous Q8H  . insulin aspart  0-20 Units Subcutaneous 6 times per day  . ipratropium  0.5 mg Nebulization TID  . levalbuterol  0.63 mg Nebulization TID  . levofloxacin (LEVAQUIN) IV  750 mg Intravenous Q48H  . pantoprazole (PROTONIX) IV  40 mg Intravenous Q12H  . simvastatin  40 mg Oral q1800  . sodium chloride flush  3 mL Intravenous Q12H    Time spent on care of this patient: 40 mins   Caral Whan, Geraldo Docker  , MD  [facility] Office  [phone] Pager - [phone]  On-Call/Text Page:      [email or web address]      password [email or web address]  If 7PM-7AM, please contact night-coverage [email or web address] Password [facility] 12/30/2015, 8:27 AM   LOS: 5 days   Care during the described time interval was provided by me .  I have reviewed this patient's available data, including medical history, events of note,  physical examination, and all test results as part of my evaluation. I have personally reviewed and interpreted all radiology studies.   Dia Crawford, MD [phone] Pager

## 2015-12-31 ENCOUNTER — Inpatient Hospital Stay (HOSPITAL_COMMUNITY): Payer: Medicare Other

## 2015-12-31 ENCOUNTER — Encounter
Admission: RE | Admit: 2015-12-31 | Discharge: 2015-12-31 | Disposition: A | Discharge status: Home / Self Care | Payer: Medicare Other | Source: Ambulatory Visit | Attending: Internal Medicine | Admitting: Internal Medicine

## 2015-12-31 DIAGNOSIS — Z954 Presence of other heart-valve replacement: Secondary | ICD-10-CM

## 2015-12-31 DIAGNOSIS — I480 Paroxysmal atrial fibrillation: Secondary | ICD-10-CM

## 2015-12-31 DIAGNOSIS — N183 Chronic kidney disease, stage 3 (moderate): Secondary | ICD-10-CM

## 2015-12-31 DIAGNOSIS — Z951 Presence of aortocoronary bypass graft: Secondary | ICD-10-CM

## 2015-12-31 DIAGNOSIS — J441 Chronic obstructive pulmonary disease with (acute) exacerbation: Secondary | ICD-10-CM

## 2015-12-31 LAB — COMPREHENSIVE METABOLIC PANEL
ALT: 23 U/L (ref 17–63)
AST: 16 U/L (ref 15–41)
Albumin: 2.4 g/dL — ABNORMAL LOW (ref 3.5–5.0)
Alkaline Phosphatase: 39 U/L (ref 38–126)
Anion gap: 8 (ref 5–15)
BILIRUBIN TOTAL: 0.8 mg/dL (ref 0.3–1.2)
BUN: 26 mg/dL — AB (ref 6–20)
CO2: 22 mmol/L (ref 22–32)
Calcium: 8.3 mg/dL — ABNORMAL LOW (ref 8.9–10.3)
Chloride: 115 mmol/L — ABNORMAL HIGH (ref 101–111)
Creatinine, Ser: 1.72 mg/dL — ABNORMAL HIGH (ref 0.61–1.24)
GFR calc Af Amer: 39 mL/min — ABNORMAL LOW (ref >60)
GFR, EST NON AFRICAN AMERICAN: 34 mL/min — AB (ref >60)
Glucose, Bld: 154 mg/dL — ABNORMAL HIGH (ref 65–99)
POTASSIUM: 3.4 mmol/L — AB (ref 3.5–5.1)
Sodium: 145 mmol/L (ref 135–145)
TOTAL PROTEIN: 5.3 g/dL — AB (ref 6.5–8.1)

## 2015-12-31 LAB — MAGNESIUM: MAGNESIUM: 1.7 mg/dL (ref 1.7–2.4)

## 2015-12-31 LAB — GLUCOSE, CAPILLARY
GLUCOSE-CAPILLARY: 134 mg/dL — AB (ref 65–99)
GLUCOSE-CAPILLARY: 99 mg/dL (ref 65–99)
Glucose-Capillary: 175 mg/dL — ABNORMAL HIGH (ref 65–99)
Glucose-Capillary: 166 mg/dL — ABNORMAL HIGH (ref 65–99)
Glucose-Capillary: 266 mg/dL — ABNORMAL HIGH (ref 65–99)

## 2015-12-31 LAB — CBC WITH DIFFERENTIAL/PLATELET
BASOS ABS: 0 10*3/uL (ref 0.0–0.1)
Basophils Relative: 0 %
EOS ABS: 0 10*3/uL (ref 0.0–0.7)
EOS PCT: 0 %
HCT: 29.3 % — ABNORMAL LOW (ref 39.0–52.0)
Hemoglobin: 9.1 g/dL — ABNORMAL LOW (ref 13.0–17.0)
LYMPHS PCT: 5 %
Lymphs Abs: 0.5 10*3/uL — ABNORMAL LOW (ref 0.7–4.0)
MCH: 29 pg (ref 26.0–34.0)
MCHC: 31.1 g/dL (ref 30.0–36.0)
MCV: 93.3 fL (ref 78.0–100.0)
Monocytes Absolute: 0.2 10*3/uL (ref 0.1–1.0)
Monocytes Relative: 2 %
Neutro Abs: 9.1 10*3/uL — ABNORMAL HIGH (ref 1.7–7.7)
Neutrophils Relative %: 93 %
PLATELETS: 212 10*3/uL (ref 150–400)
RBC: 3.14 MIL/uL — AB (ref 4.22–5.81)
RDW: 15.2 % (ref 11.5–15.5)
WBC: 9.8 10*3/uL (ref 4.0–10.5)

## 2015-12-31 MED ORDER — POTASSIUM CHLORIDE 10 MEQ/100ML IV SOLN
10.0000 meq | INTRAVENOUS | Status: AC
  Administered 2015-12-31 (×3): 10 meq via INTRAVENOUS
  Filled 2015-12-31 (×3): qty 100

## 2015-12-31 MED ORDER — LEVOFLOXACIN IN D5W 750 MG/150ML IV SOLN
750.0000 mg | INTRAVENOUS | Status: AC
  Administered 2015-12-31: 750 mg via INTRAVENOUS
  Filled 2015-12-31: qty 150

## 2015-12-31 MED ORDER — BUDESONIDE 0.25 MG/2ML IN SUSP
0.2500 mg | Freq: Two times a day (BID) | RESPIRATORY_TRACT | Status: DC
Start: 2015-12-31 — End: 2016-01-18
  Administered 2015-12-31 – 2016-01-17 (×35): 0.25 mg via RESPIRATORY_TRACT
  Filled 2015-12-31 (×35): qty 2

## 2015-12-31 MED ORDER — HYDROCORTISONE NA SUCCINATE PF 100 MG IJ SOLR
50.0000 mg | Freq: Two times a day (BID) | INTRAMUSCULAR | Status: DC
  Administered 2015-12-31 – 2016-01-01 (×3): 50 mg via INTRAVENOUS
  Filled 2015-12-31 (×3): qty 2

## 2015-12-31 MED ORDER — LEVALBUTEROL HCL 0.63 MG/3ML IN NEBU
0.6300 mg | INHALATION_SOLUTION | RESPIRATORY_TRACT | Status: DC | PRN
Start: 2015-12-31 — End: 2016-01-04

## 2015-12-31 NOTE — Clinical Social Work Note (Addendum)
CSW continuing to follow patient's progress throughout discharge planning to SNF.  Jones Broom. [place], MSW, [place] 12/31/2015 6:20 PM

## 2015-12-31 NOTE — Progress Notes (Addendum)
Occupational Therapy Evaluation Patient Details Name: Eric [name] MRN: [id number] DOB: [birth year] Today's Date: 12/31/2015    History of Present Illness 82 yo presented with 2 day history of generalized weakness which resulted in patient falling out of bed 2 days ago. Developed hip pain from this fall which persisted. History of hip fracture and surgical repair. patient has had new onset wheezing and coughing with occasional phlegm production. pt presents with A-flutter, COPD Exacerbation, and N/V.  pt with hx of AVR, CABG, HTN, DM, Shingles, and L THR.  Pt with A-fib with RVR. COPD/Bronchitis.    Clinical Impression   PTA, pt apparently lived alone and was independent with ADL and mobility. Pt pesents with significant functional decline and is currently +2 Max A with mobility and Max A with ADL. Pt will benefit from rehab at SNF to facilitate safe D/C home. Will follow acutely to address established goals. Vitals stable during session with HR increasing to 130s briefly while pt sitting EOB unsupported. 3/4 dyspnea with minimal activity. Recommend nsg use maximove to get pt from chair to bed safely.     Follow Up Recommendations  SNF;Supervision/Assistance - 24 hour    Equipment Recommendations  Other (comment) (TBA at SNF)    Recommendations for Other Services       Precautions / Restrictions Precautions Precautions: Fall Precaution Comments: watch O2 Sats` Restrictions Weight Bearing Restrictions: No      Mobility Bed Mobility Overal bed mobility: Needs Assistance;+2 for physical assistance Bed Mobility: Rolling;Sidelying to Sit Rolling: Mod assist Sidelying to sit: Max assist Supine to sit: Max assist;+2 for physical assistance     General bed mobility comments: pt needs A with Bil LEs and bringing trunk up to sitting. Significant posterior bias initially upon sitting   Transfers Overall transfer level: Needs assistance Equipment used: 2 person hand held  assist Transfers: Sit to/from [facility] Sit to Stand: Mod assist;+2 physical assistance Stand pivot transfers: Max assist;+2 physical assistance       General transfer comment: Cues for hand placement, and weight shift anteriorly as well as to push through feet for sit to stand; mod assist to power up; required more support bilaterally during more dynamic activity of getting to chair; posterior lean during transfer    Balance Overall balance assessment: Needs assistance   Sitting balance-Leahy Scale: Fair       Standing balance-Leahy Scale: Zero                              ADL Overall ADL's : Needs assistance/impaired Eating/Feeding: NPO (had MBS)   Grooming: Moderate assistance Grooming Details (indicate cue type and reason): unable to brush hair due to proximal weakness and poor endurance Upper Body Bathing: Moderate assistance   Lower Body Bathing: Maximal assistance   Upper Body Dressing : Maximal assistance   Lower Body Dressing: Maximal assistance   Toilet Transfer: +2 for physical assistance;Maximal assistance   Toileting- Clothing Manipulation and Hygiene: Total assistance (incontinent of urine/BM)       Functional mobility during ADLs: Maximal assistance;+2 for safety/equipment General ADL Comments: limited by generalized weakness and poor endurance.      Vision     Perception     Praxis      Pertinent Vitals/Pain Pain Assessment: Faces Faces Pain Scale: Hurts a little bit Pain Location: generalized; he also indicated he grimaced due to fatigue Pain Descriptors / Indicators: Grimacing Pain Intervention(s): Monitored during  session     Hand Dominance Right   Extremity/Trunk Assessment Upper Extremity Assessment Upper Extremity Assessment: Generalized weakness   Lower Extremity Assessment Lower Extremity Assessment: Defer to PT evaluation       Communication Communication Communication: Expressive  difficulties   Cognition Arousal/Alertness: Awake/alert Behavior During Therapy: WFL for tasks assessed/performed Overall Cognitive Status: No family/caregiver present to determine baseline cognitive functioning (decreased awareness of impact of deficits on his function)                     General Comments       Exercises       Shoulder Instructions      Home Living Family/patient expects to be discharged to:: Skilled nursing facility                                        Prior Functioning/Environment Level of Independence: Independent  Gait / Transfers Assistance Needed: Mentions a RW and a Cane, but unclear which he uses.   ADL's / Homemaking Assistance Needed: pt does indicate his son helps him, but it is hard to determine exactly how much help he is given.          OT Diagnosis: Generalized weakness   OT Problem List: Decreased strength;Decreased range of motion;Decreased activity tolerance;Impaired balance (sitting and/or standing);Decreased safety awareness;Decreased knowledge of use of DME or AE;Decreased knowledge of precautions;Cardiopulmonary status limiting activity;Obesity   OT Treatment/Interventions: Self-care/ADL training;Therapeutic exercise;Energy conservation;DME and/or AE instruction;Therapeutic activities;Patient/family education;Balance training    OT Goals(Current goals can be found in the care plan section) Acute Rehab OT Goals Patient Stated Goal: get stronger and go home OT Goal Formulation: With patient Time For Goal Achievement: 01/14/16 Potential to Achieve Goals: Good ADL Goals Pt Will Perform Eating: with set-up;sitting Pt Will Perform Grooming: with set-up;with supervision;sitting Pt Will Perform Upper Body Bathing: with set-up;with supervision;sitting Pt Will Transfer to Toilet: with min assist;with +2 assist;bedside commode Additional ADL Goal #1: bed mobility with min A +2 in preparation for ADL  OT Frequency:  Min 2X/week   Barriers to D/C:            Co-evaluation PT/OT/SLP Co-Evaluation/Treatment: Yes Reason for Co-Treatment: For patient/therapist safety PT goals addressed during session: Mobility/safety with mobility OT goals addressed during session: ADL's and self-care      End of Session Equipment Utilized During Treatment: Gait belt;Oxygen (2L) Nurse Communication: Mobility status;Need for lift equipment (Maximove)  Activity Tolerance: Patient tolerated treatment well Patient left: in chair;with call bell/phone within reach;with chair alarm set   Time: [id number] OT Time Calculation (min): 25 min Charges:  OT General Charges $OT Visit: 1 Procedure OT Evaluation $OT Eval Moderate Complexity: 1 Procedure G-Codes:    Cathan Gearin,HILLARY 01-18-2016, 4:22 PM   [facility], OTR/L  [phone] 01/18/16

## 2015-12-31 NOTE — Progress Notes (Addendum)
Patient Name: Eric [name] Date of Encounter: 12/31/2015  Active Problems:   HYPERCHOLESTEROLEMIA   HYPERTENSION, BENIGN SYSTEMIC   CKD (chronic kidney disease) stage 3, GFR 30-59 ml/min   Bronchitis   Acute respiratory distress ([name]   Diabetes mellitus with complication ([name]   AKI (acute kidney injury) ([place])   PAF (paroxysmal atrial fibrillation) ([name]   S/P CABG (coronary artery bypass graft)   S/P AVR (aortic valve replacement)   Atrial fibrillation with RVR ([name]   Multifocal atrial tachycardia ([name]   Sepsis ([name]   COPD exacerbation ([name]   Metabolic acidosis   Dilated cardiomyopathy ([name]   Pulmonary hypertension ([name]   CAD in native artery   H/O aortic valve replacement   Essential hypertension   Uncontrolled type 2 diabetes mellitus with complication ([name]   Acute on chronic renal failure ([name]   Hyponatremia   Hypotension   Acute renal failure superimposed on stage 3 chronic kidney disease ([place])   Delirium   Length of Stay: 6  SUBJECTIVE  Confused, no acute distress.  CURRENT MEDS . antiseptic oral rinse  7 mL Mouth Rinse BID  . aspirin EC  81 mg Oral Daily  . budesonide (PULMICORT) nebulizer solution  0.25 mg Nebulization BID  . digoxin  0.125 mg Oral Daily  . heparin subcutaneous  5,000 Units Subcutaneous 3 times per day  . hydrocortisone sod succinate (SOLU-CORTEF) inj  50 mg Intravenous Q12H  . insulin aspart  0-20 Units Subcutaneous 6 times per day  . ipratropium  0.5 mg Nebulization TID  . levalbuterol  0.63 mg Nebulization TID  . levofloxacin (LEVAQUIN) IV  750 mg Intravenous Q48H  . pantoprazole (PROTONIX) IV  40 mg Intravenous Q12H  . potassium chloride  10 mEq Intravenous Q1 Hr x 3  . simvastatin  40 mg Oral q1800  . sodium chloride flush  3 mL Intravenous Q12H    OBJECTIVE   Intake/Output Summary (Last 24 hours) at 12/31/15 1141 Last data filed at 12/31/15 1000  Gross per 24 hour  Intake 2579.25 ml  Output    750 ml  Net  1829.25 ml   Filed Weights   12/29/15 0500 12/30/15 0500 12/31/15 0444  Weight: 101.1 kg (222 lb 14.2 oz) 101.3 kg (223 lb 5.2 oz) 102 kg (224 lb 13.9 oz)    PHYSICAL EXAM Filed Vitals:   12/31/15 0444 12/31/15 0816 12/31/15 0820 12/31/15 1041  BP:   132/84   Pulse:   105   Temp: 98.3 F (36.8 C)  98.5 F (36.9 C)   TempSrc: Oral  Oral   Resp:   26   Height:      Weight: 102 kg (224 lb 13.9 oz)     SpO2:  97% 99% 99%   General: Drowsy, disoriented, no distress Head: no evidence of trauma, PERRL, EOMI, no exophtalmos or lid lag, no myxedema, no xanthelasma; normal ears, nose and oropharynx Neck: normal jugular venous pulsations and no hepatojugular reflux; brisk carotid pulses without delay and no carotid bruits Chest: bilateral wheezing, no signs of consolidation by percussion or palpation, normal fremitus, symmetrical and full respiratory excursions Cardiovascular: normal position and quality of the apical impulse, irregular rhythm, normal first and second heart sounds, no rubs or gallops, no murmur Abdomen: no tenderness or distention, no masses by palpation, no abnormal pulsatility or arterial bruits, normal bowel sounds, no hepatosplenomegaly Extremities: no clubbing, cyanosis or edema;  Neurological: limited due to limited cooperation  LABS  CBC  Recent Labs  12/30/15 0503 12/31/15 0236  WBC 13.0* 9.8  NEUTROABS 12.3* 9.1*  HGB 9.2* 9.1*  HCT 28.8* 29.3*  MCV 92.9 93.3  PLT 228 [id number]   Basic Metabolic Panel  Recent Labs  12/30/15 0503 12/31/15 0236  NA 143 145  K 3.8 3.4*  CL 113* 115*  CO2 21* 22  GLUCOSE 158* 154*  BUN 39* 26*  CREATININE 1.92* 1.72*  CALCIUM 8.5* 8.3*  MG 1.8 1.7   Liver Function Tests  Recent Labs  12/30/15 0503 12/31/15 0236  AST 17 16  ALT 28 23  ALKPHOS 43 39  BILITOT 0.7 0.8  PROT 5.5* 5.3*  ALBUMIN 2.6* 2.4*    Radiology Studies Imaging results have been reviewed and No results found.  TELE atrial fibrillation  with RVR  ECG atrial fibrillation RVR  ASSESSMENT AND PLAN  1. Atrial fibrillation with RVR (previous atrial flutter/multifocal atrial tachycardia) Continue IV cardizem for rate control, transition to PO when confusion improves and passes swallow study. Echo shows normal LV function and normally functioning prosthetic aortic valve; Possible severe MS, hard to assess due to rapid AF. TSH normal; needs anticoagulation long term. However, with recent fall and now confusion, he would be high risk. Continue ASA for now and reassess as outpt. Not a good candidate for amiodarone given underlying lung problem. Avoid higher dig dose due to renal insufficiency. Not a godd beta blocker candidate due to COPD. 2. COPD/bronchitis-continue bronchodilators, steroids and antibiotics. Management per primary care. 3. S/P bioprosthetic AVR - normal function on echo. 4. Acute on chronic kidney disease - Renal function unchanged; follow. 5. CAD s/p CABG - continue ASA and statin; elevated troponin-likely related to atrial arrhythmias and renal insufficiency. No plans for further ischemia evaluation. 6. Confusion - Possibly related to sundowning and benzodiazepines. May need head CT if does not improve.  Sanda Klein, MD, [facility] [phone] office [phone] pager 12/31/2015 11:41 AM

## 2015-12-31 NOTE — Progress Notes (Signed)
[place] TEAM PROGRESS NOTE  Eric [name] TSV:[id number] DOB: October 11, 1927 DOA: 12/25/2015 PCP: [name], MD  Admit HPI / Brief Narrative: 82 y.o. male who c/o a 2 day history of generalized weakness which lead to him falling out of bed 2 days prior. He developed hip pain from this fall. He also noted the new onset wheezing and coughing with occasional phlegm production, and a runny nose w/ upper respiratory congestion. He also noted a very poor appetite with very little oral intake.   He came to the ER and was found to be tachycardic (had P waves - was irregular - possible MAT).  He was treated with nebs with improvement of resp failure and tightness. His HR continued to climb to 135. He was given Cardizem 20 mg with BP dropping after. He was given a fluid bolus, and a dilt drip at 5 mg was begun.   HPI/Subjective: The patient is alert and conversant, but mildly confused.  When I ask him the year, he tells me his age, but he does catch the mistake.  He denies cp, sob, n/v, or abdom pain, and tells me his is anxious to go home today.  He appears mildly SOB though he denies it.    Assessment/Plan:  SIRS  On admission patient met guidelines for SIRS w/ HR> 90, RR> 20, WBC> 12 - no infiltrate on CXR - no + culture results - UA not c/w UTI - most c/w SIRS and NOT SEPSIS   Acute COPD exac  No infiltrate on CXR - resp status appears to be stabilizing but is not yet maximizd - follow w/ nebs - wean steroids - will likely require home O2 (check ambulatory sat)  Tachyarrhythmia - Sinus Tachy w/ non-conducted PACs v/s MAT > Aflutter > A fib w/ RVR Cardiology following - IV cardizem for rate control - TSH normal - high risk for anticoag given recent falls - Cards suggests ASA for now and reconsider anticaog in outpt f/u - no amio due to severe lung disease   Acute mental status change -Multifactorial to include SIRS, Ativan, steroid, acute on chronic renal failure,  sundowning  -CT head unrevealing   Dysphagia Did poorly on bedside SLP eval - for MBS today   DM2 - acutely severely uncontrolled  A1c 7.1 - required insulin gtt earlier in stay - CBG now well controlled but pt is NPO   N/V - resolved  KUB noted gaseous distention of stomach on BIPAP - suspect bloating is mostly air blown in w/ BIPAP - simethicone - resolved   Acute on chronic kidney disease Stage 3 Baseline creatinine 1.6 - creatinine at presentation 2.58 w/ peak of 3.12 - avoid ACE/ARB - crt now at ~baseline   Hyponatremia Appears to have been due to hypovolemia and hyperglycemia - resolved   Coronary artery disease status post CABG x3 2010 Cont ASA + lipid med  Aortic stenosis status post valve replacement 2010 Pericardial tissue valve   Carotid artery stenosis  Dopplers May 2015 showed a 60-79% right and 1-39% left stenosis  Hyperlipidemia Cont med tx  GERD  HTN Not an active issue at this time   Code Status: DO NOT RESUSCITATE Family Communication: no family present at time of exam Disposition Plan: SDU on cardizem gtt - ambulate w/ PT/OT - ambulatory sat eval   Consultants: [facility] Cards   Procedures: 3/9 TTE - EF 60-65% - Aortic valve: A bioprosthesis was present and functioning normally - Mitral  valve: severe stenosis. Mean gradient (D): 11 mm Hg - Right ventricle: size was normal. Wall thickness normal. Systolic function was normal - Tricuspid valve: trivial regurgitation.  Antibiotics: levaquin 3/7 >  DVT prophylaxis: Subcutaneous heparin  Objective: Blood pressure 132/84, pulse 105, temperature 98.5 F (36.9 C), temperature source Oral, resp. rate 26, height 5' 9"  (1.753 m), weight 102 kg (224 lb 13.9 oz), SpO2 99 %.  Intake/Output Summary (Last 24 hours) at 12/31/15 0944 Last data filed at 12/31/15 0600  Gross per 24 hour  Intake 2354.25 ml  Output    750 ml  Net 1604.25 ml   Filed Weights   12/29/15 0500 12/30/15 0500 12/31/15 0444  Weight:  101.1 kg (222 lb 14.2 oz) 101.3 kg (223 lb 5.2 oz) 102 kg (224 lb 13.9 oz)   Exam: General: alert and conversant - mildly confused  Lungs: distant bs th/o - no active wheeze - scattered crackles  Cardiovascular: irregular - tachycardic at 110bpm  Abdomen: NT/ND, soft, bs+, no mass   Extremities: no significant cyanosis, or clubbing - trace edema bilateral lower extremities  Data Reviewed:  Basic Metabolic Panel:  Recent Labs Lab 12/27/15 0440 12/28/15 0300 12/29/15 0243 12/30/15 0503 12/31/15 0236  NA 135 137 142 143 145  K 4.2 4.3 4.0 3.8 3.4*  CL 106 109 115* 113* 115*  CO2 15* 17* 18* 21* 22  GLUCOSE 278* 229* 144* 158* 154*  BUN 51* 55* 52* 39* 26*  CREATININE 2.47* 2.22* 2.10* 1.92* 1.72*  CALCIUM 8.5* 8.8* 8.8* 8.5* 8.3*  MG  --  2.2 2.1 1.8 1.7    CBC:  Recent Labs Lab 12/25/15 1226  12/27/15 0440 12/28/15 0300 12/29/15 0243 12/30/15 0503 12/31/15 0236  WBC 13.9*  < > 15.5* 12.4* 13.2* 13.0* 9.8  NEUTROABS 12.9*  --   --  12.0* 12.6* 12.3* 9.1*  HGB 10.7*  < > 9.3* 9.8* 9.5* 9.2* 9.1*  HCT 32.6*  < > 27.8* 31.3* 28.4* 28.8* 29.3*  MCV 90.8  < > 91.7 92.1 91.6 92.9 93.3  PLT 138*  < > 136* 170 193 228 212  < > = values in this interval not displayed. Liver Function Tests:  Recent Labs Lab 12/27/15 0440 12/28/15 0300 12/29/15 0243 12/30/15 0503 12/31/15 0236  AST 54* 39 20 17 16   ALT 40 43 34 28 23  ALKPHOS 53 53 46 43 39  BILITOT 0.8 0.7 0.6 0.7 0.8  PROT 6.0* 6.0* 5.7* 5.5* 5.3*  ALBUMIN 2.6* 2.7* 2.8* 2.6* 2.4*   Coags:  Recent Labs Lab 12/26/15 0252  INR 1.20   Cardiac Enzymes:  Recent Labs Lab 12/25/15 1744 12/25/15 2234 12/26/15 0252  TROPONINI 0.11* 0.36* 0.11*    CBG:  Recent Labs Lab 12/30/15 1602 12/30/15 2011 12/30/15 2314 12/31/15 0325 12/31/15 0819  GLUCAP 148* 156* 147* 134* 99    Recent Results (from the past 240 hour(s))  MRSA PCR Screening     Status: None   Collection Time: 12/25/15 10:28 PM  Result  Value Ref Range Status   MRSA by PCR NEGATIVE NEGATIVE Final    Comment:        The GeneXpert MRSA Assay (FDA approved for NASAL specimens only), is one component of a comprehensive MRSA colonization surveillance program. It is not intended to diagnose MRSA infection nor to guide or monitor treatment for MRSA infections.   Culture, blood (routine x 2)     Status: None (Preliminary result)   Collection Time: 12/27/15  4:09  PM  Result Value Ref Range Status   Specimen Description BLOOD LEFT ANTECUBITAL  Final   Special Requests IN PEDIATRIC BOTTLE 3CC  Final   Culture NO GROWTH 3 DAYS  Final   Report Status PENDING  Incomplete  Culture, blood (routine x 2)     Status: None (Preliminary result)   Collection Time: 12/27/15  4:15 PM  Result Value Ref Range Status   Specimen Description BLOOD LEFT HAND  Final   Special Requests IN PEDIATRIC BOTTLE 3CC  Final   Culture NO GROWTH 3 DAYS  Final   Report Status PENDING  Incomplete  Culture, Urine     Status: None   Collection Time: 12/27/15  6:32 PM  Result Value Ref Range Status   Specimen Description URINE, RANDOM  Final   Special Requests NONE  Final   Culture NO GROWTH 2 DAYS  Final   Report Status 12/29/2015 FINAL  Final    Studies:   Recent x-ray studies have been reviewed in detail by the Attending Physician  Scheduled Meds:  Scheduled Meds: . antiseptic oral rinse  7 mL Mouth Rinse BID  . aspirin EC  81 mg Oral Daily  . digoxin  0.125 mg Oral Daily  . heparin subcutaneous  5,000 Units Subcutaneous 3 times per day  . hydrocortisone sod succinate (SOLU-CORTEF) inj  50 mg Intravenous Q8H  . insulin aspart  0-20 Units Subcutaneous 6 times per day  . ipratropium  0.5 mg Nebulization TID  . levalbuterol  0.63 mg Nebulization TID  . levofloxacin (LEVAQUIN) IV  750 mg Intravenous Q48H  . pantoprazole (PROTONIX) IV  40 mg Intravenous Q12H  . simvastatin  40 mg Oral q1800  . sodium chloride flush  3 mL Intravenous Q12H     Time spent on care of this patient: 35 mins   Taneil Lazarus T , MD   [facility] Office  [phone] Pager - Text Page per Shea Evans as per below:  On-Call/Text Page:      [email or web address]      password [email or web address]  If 7PM-7AM, please contact night-coverage [email or web address] Password [email or web address] 12/31/2015, 9:44 AM   LOS: 6 days

## 2015-12-31 NOTE — Progress Notes (Signed)
Physical Therapy Treatment Patient Details Name: Eric [name] MRN: [id number] DOB: 16-Dec-1926 Today's Date: 12/31/2015    History of Present Illness 82 yo presented with 2 day history of generalized weakness which resulted in patient falling out of bed 2 days ago. Developed hip pain from this fall which persisted. History of hip fracture and surgical repair. patient has had new onset wheezing and coughing with occasional phlegm production. pt presents with A-flutter, COPD Exacerbation, and N/V.  pt with hx of AVR, CABG, HTN, DM, Shingles, and L THR.  Pt with A-fib with RVR. COPD/Bronchitis.     PT Comments    Continuing work on functional mobility and activity tolerance; Session conducted on 2 L supplemental O2 and sats 95% at end of session; noted HR max 133 during session;   Requires +2 assist to safely take steps, making way to recliner; limited by fatigue, but participating well.  Follow Up Recommendations  SNF     Equipment Recommendations  None recommended by PT    Recommendations for Other Services       Precautions / Restrictions Precautions Precautions: Fall Precaution Comments: watch O2 Sats` Restrictions Weight Bearing Restrictions: No    Mobility  Bed Mobility Overal bed mobility: Needs Assistance;+2 for physical assistance Bed Mobility: Rolling;Sidelying to Sit Rolling: Mod assist Sidelying to sit: Max assist Supine to sit: Max assist;+2 for physical assistance     General bed mobility comments: pt needs A with Bil LEs and bringing trunk up to sitting. Significant posterior bias initially upon sitting   Transfers Overall transfer level: Needs assistance Equipment used: 2 person hand held assist Transfers: Sit to/from [facility] Sit to Stand: Mod assist;+2 physical assistance Stand pivot transfers: Max assist;+2 physical assistance       General transfer comment: Cues for hand placement, and weight shift anteriorly as well as to push  through feet for sit to stand; mod assist to power up; required more support bilaterally during more dynamic activity of getting to chair; posterior lean during transfer  Ambulation/Gait                 Stairs            Wheelchair Mobility    Modified Rankin (Stroke Patients Only)       Balance Overall balance assessment: Needs assistance   Sitting balance-Leahy Scale: Fair       Standing balance-Leahy Scale: Zero                      Cognition Arousal/Alertness: Awake/alert Behavior During Therapy: WFL for tasks assessed/performed Overall Cognitive Status: No family/caregiver present to determine baseline cognitive functioning (decreased awareness of impact of deficits on his function)                      Exercises      General Comments        Pertinent Vitals/Pain Pain Assessment: Faces Faces Pain Scale: Hurts a little bit Pain Location: generalized; he also indicated he grimaced due to fatigue Pain Descriptors / Indicators: Grimacing Pain Intervention(s): Monitored during session    Home Living Family/patient expects to be discharged to:: Skilled nursing facility                    Prior Function Level of Independence: Independent  Gait / Transfers Assistance Needed: Mentions a RW and a Cane, but unclear which he uses.   ADL's / Homemaking Assistance Needed: pt  does indicate his son helps him, but it is hard to determine exactly how much help he is given.       PT Goals (current goals can now be found in the care plan section) Acute Rehab PT Goals Patient Stated Goal: get stronger and go home PT Goal Formulation: With patient Time For Goal Achievement: 01/09/16 Potential to Achieve Goals: Good Progress towards PT goals: Progressing toward goals    Frequency  Min 2X/week    PT Plan Current plan remains appropriate    Co-evaluation PT/OT/SLP Co-Evaluation/Treatment: Yes Reason for Co-Treatment: For  patient/therapist safety PT goals addressed during session: Mobility/safety with mobility       End of Session Equipment Utilized During Treatment: Gait belt Activity Tolerance: Patient limited by fatigue Patient left: in chair;with call bell/phone within reach;with nursing/sitter in room     Time: [id number] PT Time Calculation (min) (ACUTE ONLY): 25 min  Charges:  $Therapeutic Activity: 8-22 mins                    G Codes:      Eric [name] 12/31/2015, 4:13 PM  Eric [name], [place] Pager [phone] Office [phone]

## 2015-12-31 NOTE — Care Management Important Message (Signed)
Important Message  Patient Details  Name: Eric [name] MRN: [id number] Date of Birth: 07/23/1927   Medicare Important Message Given:  Other (see comment) (not given per ncm)    [name] [name] [name] 12/31/2015, 12:03 PM

## 2015-12-31 NOTE — Progress Notes (Addendum)
Speech Pathology:  MBSS complete. Full report located under chart review in imaging section.  Kimm Ungaro L. Rasheen Bells, MA CCC/SLP Pager [phone]   

## 2015-12-31 NOTE — Progress Notes (Signed)
Occupational Therapy Treatment Patient Details Name: Eric [name] MRN: [id number] DOB: April 14, 1927 Today's Date: 12/31/2015    History of present illness 82 yo presented with 2 day history of generalized weakness which resulted in patient falling out of bed 2 days ago. Developed hip pain from this fall which persisted. History of hip fracture and surgical repair. patient has had new onset wheezing and coughing with occasional phlegm production. pt presents with A-flutter, COPD Exacerbation, and N/V.  pt with hx of AVR, CABG, HTN, DM, Shingles, and L THR.  Pt with A-fib with RVR. COPD/Bronchitis.    OT comments  Pt received tray after cleared by ST and was unable to feed himself. Propr B elbows on blanket rolls and use adapted built up handle with occasional hand over hand to assist to feed self. Pt requires full S with meal and this technique will encourage proper rate that the pt can tolerate and increase strength for self feeding. Nsg educated. Will follow. May assess with weighted utensils due to tremor.   Follow Up Recommendations  SNF;Supervision/Assistance - 24 hour    Equipment Recommendations  Other (comment)    Recommendations for Other Services      Precautions / Restrictions Precautions Precautions: Fall Precaution Comments: watch O2 Sats` Restrictions Weight Bearing Restrictions: No       Mobility   Balance    ADL        General ADL Comments: Pt on dysphagia 1 diet with thin liquids/puree foods. Pt unable to feed himself at this time. Bolstered B elbow s up with 2 blankets each side. Tray in front. Used built up handle on spoon. Able to complete hand to mouth pattern but appears limited by tremor and weakness. Required min A with hand over hand at times. May need to assess with use of weighted utensil. NA educated in appropriate set up. Pt to have full supervision when eating.      Vision                     Perception     Praxis      Cognition    Behavior During Therapy: WFL for tasks assessed/performed Overall Cognitive Status: No family/caregiver present to determine baseline cognitive functioning                       Extremity/Trunk Assessment  Upper Extremity Assessment Upper Extremity Assessment: Generalized weakness   Lower Extremity Assessment Lower Extremity Assessment: Defer to PT evaluation        Exercises     Shoulder Instructions       General Comments      Pertinent Vitals/ Pain       Pain Assessment: Faces Faces Pain Scale: No hurt Pain Location: generalized; he also indicated he grimaced due to fatigue Pain Descriptors / Indicators: Grimacing Pain Intervention(s): Monitored during session  Home Living Family/patient expects to be discharged to:: Skilled nursing facility                                        Prior Functioning/Environment Level of Independence: Independent  Gait / Transfers Assistance Needed: Mentions a RW and a Cane, but unclear which he uses.   ADL's / Homemaking Assistance Needed: pt does indicate his son helps him, but it is hard to determine exactly how much help he is given.  Frequency Min 2X/week     Progress Toward Goals  OT Goals(current goals can now be found in the care plan section)  Progress towards OT goals: Progressing toward goals  Acute Rehab OT Goals Patient Stated Goal: get stronger and go home OT Goal Formulation: With patient Time For Goal Achievement: 01/14/16 Potential to Achieve Goals: Good ADL Goals Pt Will Perform Eating: with set-up;sitting Pt Will Perform Grooming: with set-up;with supervision;sitting Pt Will Perform Upper Body Bathing: with set-up;with supervision;sitting Pt Will Transfer to Toilet: with min assist;with +2 assist;bedside commode Additional ADL Goal #1: bed mobility with min A +2 in preparation for ADL  Plan Discharge plan remains appropriate            End of Session Equipment  Utilized During Treatment:    Activity Tolerance Patient tolerated treatment well   Patient Left in chair;with call bell/phone within reach   Nurse Communication Mobility status;Other (comment) (AE/set up for self feeding)        Time: 1545-1600 OT Time Calculation (min): 15 min  Charges: OT General Charges $OT Visit: 1 Procedure  OT Treatments $Self Care/Home Management : 8-22 mins  Finas Delone,HILLARY 12/31/2015, 4:27 PM   [facility], OTR/L  [phone] 12/31/2015

## 2016-01-01 DIAGNOSIS — I420 Dilated cardiomyopathy: Secondary | ICD-10-CM

## 2016-01-01 LAB — CULTURE, BLOOD (ROUTINE X 2)
CULTURE: NO GROWTH
CULTURE: NO GROWTH

## 2016-01-01 LAB — GLUCOSE, CAPILLARY
GLUCOSE-CAPILLARY: 150 mg/dL — AB (ref 65–99)
GLUCOSE-CAPILLARY: 184 mg/dL — AB (ref 65–99)
GLUCOSE-CAPILLARY: 198 mg/dL — AB (ref 65–99)
GLUCOSE-CAPILLARY: 239 mg/dL — AB (ref 65–99)
Glucose-Capillary: 197 mg/dL — ABNORMAL HIGH (ref 65–99)
Glucose-Capillary: 222 mg/dL — ABNORMAL HIGH (ref 65–99)

## 2016-01-01 LAB — BASIC METABOLIC PANEL
Anion gap: 9 (ref 5–15)
BUN: 25 mg/dL — AB (ref 6–20)
CO2: 21 mmol/L — ABNORMAL LOW (ref 22–32)
CREATININE: 1.67 mg/dL — AB (ref 0.61–1.24)
Calcium: 8.4 mg/dL — ABNORMAL LOW (ref 8.9–10.3)
Chloride: 116 mmol/L — ABNORMAL HIGH (ref 101–111)
GFR calc Af Amer: 41 mL/min — ABNORMAL LOW (ref >60)
GFR, EST NON AFRICAN AMERICAN: 35 mL/min — AB (ref >60)
Glucose, Bld: 143 mg/dL — ABNORMAL HIGH (ref 65–99)
Potassium: 3.5 mmol/L (ref 3.5–5.1)
SODIUM: 146 mmol/L — AB (ref 135–145)

## 2016-01-01 LAB — DIGOXIN LEVEL: Digoxin Level: <0.2 ng/mL — ABNORMAL LOW (ref 0.8–2.0)

## 2016-01-01 MED ORDER — LEVOFLOXACIN 750 MG PO TABS
750.0000 mg | ORAL_TABLET | Freq: Once | ORAL | Status: DC
  Filled 2016-01-01: qty 1

## 2016-01-01 MED ORDER — HYDROCORTISONE NA SUCCINATE PF 100 MG IJ SOLR
25.0000 mg | Freq: Two times a day (BID) | INTRAMUSCULAR | Status: AC
  Administered 2016-01-02 (×2): 25 mg via INTRAVENOUS
  Filled 2016-01-01 (×2): qty 2

## 2016-01-01 MED ORDER — GLUCERNA SHAKE PO LIQD
237.0000 mL | Freq: Three times a day (TID) | ORAL | Status: DC
  Administered 2016-01-01 – 2016-01-06 (×11): 237 mL via ORAL

## 2016-01-01 MED ORDER — BISOPROLOL FUMARATE 5 MG PO TABS
2.5000 mg | ORAL_TABLET | Freq: Every day | ORAL | Status: DC
  Administered 2016-01-01 – 2016-01-17 (×16): 2.5 mg via ORAL
  Filled 2016-01-01 (×16): qty 1

## 2016-01-01 MED ORDER — ATORVASTATIN CALCIUM 20 MG PO TABS
20.0000 mg | ORAL_TABLET | Freq: Every day | ORAL | Status: DC
  Administered 2016-01-01 – 2016-01-17 (×17): 20 mg via ORAL
  Filled 2016-01-01 (×17): qty 1

## 2016-01-01 MED ORDER — PANTOPRAZOLE SODIUM 40 MG PO TBEC
40.0000 mg | DELAYED_RELEASE_TABLET | Freq: Two times a day (BID) | ORAL | Status: DC
  Administered 2016-01-01 – 2016-01-06 (×10): 40 mg via ORAL
  Filled 2016-01-01 (×11): qty 1

## 2016-01-01 MED ORDER — DILTIAZEM HCL 60 MG PO TABS
120.0000 mg | ORAL_TABLET | Freq: Three times a day (TID) | ORAL | Status: DC
  Administered 2016-01-01 – 2016-01-17 (×48): 120 mg via ORAL
  Filled 2016-01-01 (×29): qty 2
  Filled 2016-01-01: qty 4
  Filled 2016-01-01 (×19): qty 2
  Filled 2016-01-01: qty 4
  Filled 2016-01-01: qty 2

## 2016-01-01 NOTE — Progress Notes (Signed)
Eric [name]  Eric [name] [id number] DOB: 11-28-1926 DOA: 12/25/2015 PCP: [name], MD  Admit HPI / Brief Narrative: 82 y.o. WM PMHx  CVA, CAD native artery S/P CABG 2010, S/P bioprosthetic AV replacement 2010, HTN, HLD, DM Type 2, Bilateral hydrocele, C-spine Foraminal Stenosis  c/o a 2 day history of generalized weakness which lead to him falling out of bed 2 days prior. He developed hip pain from this fall. He also noted the new onset wheezing and coughing with occasional phlegm production, and a runny nose w/ upper respiratory congestion. He also noted a very poor appetite with very little oral intake.   He came to the ER and was found to be tachycardic (had P waves - was irregular - possible MAT). He was treated with nebs with improvement of resp failure and tightness. His HR continued to climb to 135. He was given Cardizem 20 mg with BP dropping after. He was given a fluid bolus, and a dilt drip at 5 mg was begun.    HPI/Subjective: 3/14  A/O  4 cracking jokes with family and staff. With maximum assistance was able to sit on the side of the bed extremely weak.,  Assessment/Plan: Sepsis unspecified organism/ Acute COPD exacerbation  -On admission patient met guidelines for sepsis HR> 90, RR> 20, WBC> 12 -No infiltrate on CXR - Continue current antibiotic complete 7 day course -Continue normal saline 100 ml/hr - Atrovent QID, Xopenex PRN -3/14 Decrease Stress dose steroids Solu-Cortef 25 TID  Metabolic acidosis with anion gap -Possibly multifactorial to include uremia, DKA? -Anion gap resolved, bicarbonate remains slightly low -Urinalysis negative ketones -Urine culture NGTD  -Improving  Dilated Cardiomyopathy -Strict in and out since admission 11.1 L -Daily weight admission = 95.2 kg           3/14 bed weight= 104 kg -Echocardiogram; essentially normal see results below -Transfuse for hemoglobin<8  Tachyarrhythmia  - Sinus Tachy w/ non-conducted PACs v/s MAT/A. fib RVR -Cardiology following -Currently patient in A. fib RVR, still only partially controlled - Bisoprolol 2.5 mg daily per cardiology -Digoxin 0.125 mg daily -3/14 digoxin level pending  Pulmonary hypertension -See dilated cardiomyopathy/tachyarrhythmia  Coronary artery disease status post CABG x3 2010 -Cont ASA + lipid med - denies cp   Aortic stenosis status post valve replacement 2010 -Pericardial tissue valve   Carotid artery stenosis  Dopplers May 2015 showed a 60-79% right and 1-39% left stenosis  HTN/Hypotension -HTN Not an active issue at this time, in fact patient is borderline hypotensive  -TSH WNL -See sepsis  DM Type2 - acutely severely uncontrolled -3/7 Hemoglobin A1c =7.1 -Resistant SSI  N/V -KUB noted gaseous distention of stomach on BIPAP -Resolved  -Dysphagia 1 diet nectar thick  -Post in BS recommend dysphagia 1 thin liquid    Acute on CKD stage III (Baseline creatinine 1.6)  - creatinine peak of 3.12-->> 1.67 - Cr continues to trend down with hydration  - avoid ACE/ARB   Hyponatremia -Resolved with hydration    Hyperlipidemia -Lipitor 20 mg daily  GERD  Acute mental status change -Multifactorial to include sepsis, metabolic acidosis, iatrogenic (Ativan)+ acute on chronic renal failure, and CVA? -CT head; old infarct nondiagnostic see results below -Back to baseline per daughter  Goals of care -Patient be out of bed, ambulate as possible q shift in order to build strength for discharge to SNF    Code Status: DO NOT RESUSCITATE  Family Communication: no family present at  time of exam Disposition Plan: SNF Resolution sepsis/altered mental status    Consultants: Dr. Lelon Perla [facility]   Procedure/Significant Events: 3/9 echocardiogram;LVEF=60%-65%. -- Aortic valve: A bioprosthesis was present and functioning  normally. - Mitral valve: severe stenosis. Mean gradient (D): 11 mm  Hg. - Right ventricle:  size was normal. Wall thickness normal. Systolic function was normal. - Tricuspid valve: trivial regurgitation. 3/10 CT head WO contrast;No acute intracranial abnormality. - Mild cerebral atrophy with chronic microvascular ischemic changes cerebral white matter  -old lacunar infarct in the left cerebellar hemisphere 3/13 MBS; Mild aspiration risk  Culture 3/7 influenza A/B/H1N1 negative 3/7 MRSA negative 3/9 urine NGTD  3/9 blood left  AC/hand NGTD    Antibiotics: Levofloxacin 3/7>  DVT prophylaxis: Subcutaneous heparin   Devices    LINES / TUBES:      Continuous Infusions: . sodium chloride 50 mL/hr at 12/31/15 1745  . diltiazem (CARDIZEM) infusion 10 mg/hr (12/31/15 2229)    Objective: VITAL SIGNS: Temp: 98.5 F (36.9 C) (03/14 0811) Temp Source: Oral (03/14 0811) BP: 135/72 mmHg (03/14 0811) Pulse Rate: 98 (03/14 0811) SPO2; FIO2:   Intake/Output Summary (Last 24 hours) at 01/01/16 6861 Last data filed at 01/01/16 0620  Gross per 24 hour  Intake   2020 ml  Output   1050 ml  Net    970 ml     Exam: General: A/O 4, joking with family and staff, NAD, No acute respiratory distress Eyes:  negative scleral hemorrhage ENT: Negative Runny nose, negative gingival bleeding, Neck:  Negative scars, masses, torticollis, lymphadenopathy, JVD Lungs: Mild expiratory wheezing diffusely, negative crackles Cardiovascular: Irregular irregular rhythm and rate, without murmur gallop or rub normal S1 and S2 Abdomen:negative abdominal pain, nondistended, positive soft, bowel sounds, no rebound, no ascites, no appreciable mass Extremities: No significant cyanosis, clubbing, or edema bilateral lower extremities Psychiatric:  Negative depression, negative anxiety, negative fatigue, negative mania  Neurologic:  Cranial nerves II through XII intact, tongue/uvula midline, all extremities muscle strength 5/5, sensation intact throughout, Positive  dysarthria (chronic), negative expressive aphasia, negative receptive aphasia.    Data Reviewed: Basic Metabolic Panel:  Recent Labs Lab 12/28/15 0300 12/29/15 0243 12/30/15 0503 12/31/15 0236 01/01/16 0240  NA 137 142 143 145 146*  K 4.3 4.0 3.8 3.4* 3.5  CL 109 115* 113* 115* 116*  CO2 17* 18* 21* 22 21*  GLUCOSE 229* 144* 158* 154* 143*  BUN 55* 52* 39* 26* 25*  CREATININE 2.22* 2.10* 1.92* 1.72* 1.67*  CALCIUM 8.8* 8.8* 8.5* 8.3* 8.4*  MG 2.2 2.1 1.8 1.7  --    Liver Function Tests:  Recent Labs Lab 12/27/15 0440 12/28/15 0300 12/29/15 0243 12/30/15 0503 12/31/15 0236  AST 54* 39 20 17 16   ALT 40 43 34 28 23  ALKPHOS 53 53 46 43 39  BILITOT 0.8 0.7 0.6 0.7 0.8  PROT 6.0* 6.0* 5.7* 5.5* 5.3*  ALBUMIN 2.6* 2.7* 2.8* 2.6* 2.4*   No results for input(s): LIPASE, AMYLASE in the last 168 hours. No results for input(s): AMMONIA in the last 168 hours. CBC:  Recent Labs Lab 12/25/15 1226  12/27/15 0440 12/28/15 0300 12/29/15 0243 12/30/15 0503 12/31/15 0236  WBC 13.9*  < > 15.5* 12.4* 13.2* 13.0* 9.8  NEUTROABS 12.9*  --   --  12.0* 12.6* 12.3* 9.1*  HGB 10.7*  < > 9.3* 9.8* 9.5* 9.2* 9.1*  HCT 32.6*  < > 27.8* 31.3* 28.4* 28.8* 29.3*  MCV 90.8  < > 91.7  92.1 91.6 92.9 93.3  PLT 138*  < > 136* 170 193 228 212  < > = values in this interval not displayed. Cardiac Enzymes:  Recent Labs Lab 12/25/15 1744 12/25/15 2234 12/26/15 0252  TROPONINI 0.11* 0.36* 0.11*   BNP (last 3 results) No results for input(s): BNP in the last 8760 hours.  ProBNP (last 3 results) No results for input(s): PROBNP in the last 8760 hours.  CBG:  Recent Labs Lab 12/31/15 1557 12/31/15 1934 01/01/16 0006 01/01/16 0356 01/01/16 0813  GLUCAP 166* 266* 198* 150* 197*    Recent Results (from the past 240 hour(s))  MRSA PCR Screening     Status: None   Collection Time: 12/25/15 10:28 PM  Result Value Ref Range Status   MRSA by PCR NEGATIVE NEGATIVE Final    Comment:         The GeneXpert MRSA Assay (FDA approved for NASAL specimens only), is one component of a comprehensive MRSA colonization surveillance program. It is not intended to diagnose MRSA infection nor to guide or monitor treatment for MRSA infections.   Culture, blood (routine x 2)     Status: None (Preliminary result)   Collection Time: 12/27/15  4:09 PM  Result Value Ref Range Status   Specimen Description BLOOD LEFT ANTECUBITAL  Final   Special Requests IN PEDIATRIC BOTTLE 3CC  Final   Culture NO GROWTH 4 DAYS  Final   Report Status PENDING  Incomplete  Culture, blood (routine x 2)     Status: None (Preliminary result)   Collection Time: 12/27/15  4:15 PM  Result Value Ref Range Status   Specimen Description BLOOD LEFT HAND  Final   Special Requests IN PEDIATRIC BOTTLE 3CC  Final   Culture NO GROWTH 4 DAYS  Final   Report Status PENDING  Incomplete  Culture, Urine     Status: None   Collection Time: 12/27/15  6:32 PM  Result Value Ref Range Status   Specimen Description URINE, RANDOM  Final   Special Requests NONE  Final   Culture NO GROWTH 2 DAYS  Final   Report Status 12/29/2015 FINAL  Final     Studies:  Recent x-ray studies have been reviewed in detail by the Attending Physician  Scheduled Meds:  Scheduled Meds: . antiseptic oral rinse  7 mL Mouth Rinse BID  . aspirin EC  81 mg Oral Daily  . budesonide (PULMICORT) nebulizer solution  0.25 mg Nebulization BID  . digoxin  0.125 mg Oral Daily  . heparin subcutaneous  5,000 Units Subcutaneous 3 times per day  . hydrocortisone sod succinate (SOLU-CORTEF) inj  50 mg Intravenous Q12H  . insulin aspart  0-20 Units Subcutaneous 6 times per day  . ipratropium  0.5 mg Nebulization TID  . levalbuterol  0.63 mg Nebulization TID  . pantoprazole (PROTONIX) IV  40 mg Intravenous Q12H  . simvastatin  40 mg Oral q1800  . sodium chloride flush  3 mL Intravenous Q12H    Time spent on care of this patient: 40 mins   WOODS,  Geraldo Docker , MD  [facility] Office  [phone] Pager - [phone]  On-Call/Text Page:      [email or web address]      password [email or web address]  If 7PM-7AM, please contact night-coverage [email or web address] Password [email or web address] 01/01/2016, 9:06 AM   LOS: 7 days   Care during the described time interval was provided by me .  I have reviewed this patient's available data, including medical history, events of [name], physical  examination, and all test results as part of my evaluation. I have personally reviewed and interpreted all radiology studies.   Dia Crawford, MD [phone] Pager

## 2016-01-01 NOTE — Progress Notes (Signed)
Inpatient Diabetes Program Recommendations  AACE/ADA: New Consensus Statement on Inpatient Glycemic Control (2015)  Target Ranges:  Prepandial:   less than 140 mg/dL      Peak postprandial:   less than 180 mg/dL (1-2 hours)      Critically ill patients:  140 - 180 mg/dL   Results for Eric [name], Eric [name] (MRN [id number]) as of 01/01/2016 14:46  Ref. Range 01/01/2016 00:06 01/01/2016 03:56 01/01/2016 08:13 01/01/2016 12:24  Glucose-Capillary Latest Ref Range: 65-99 mg/dL 198 (H) 150 (H) 197 (H) 239 (H)    Home DM Meds: Lantus 28 units QHS       Glipizide 5 mg daily  Current Insulin Orders: Novolog Resistant Correction Scale/ SSI (0-20 units) Q4 hours     -Note patient currently getting IV Solucortef 50 mg bid.  -Note Lantus discontinued on 12/28/15.  -Glucose levels elevated above 180 mg/dl.    MD- Please consider restarting a portion of patient's home dose of Lantus-  Lantus 14 units QHS (50% home dose)      --Will follow patient during hospitalization--  Wyn Quaker RN, MSN, CDE Diabetes Coordinator Inpatient Glycemic Control Team Team Pager: [phone] (8a-5p)

## 2016-01-01 NOTE — Progress Notes (Signed)
Nutrition Follow-up  DOCUMENTATION CODES:   Obesity unspecified  INTERVENTION:   -Glucerna Shake po TID, each supplement provides 220 kcal and 10 grams of protein -Magic Cup TID with meals  NUTRITION DIAGNOSIS:   Inadequate oral intake related to poor appetite as evidenced by meal completion < 25%.  Ongoing  GOAL:   Patient will meet greater than or equal to 90% of their needs  Unmet  MONITOR:   PO intake, Supplement acceptance, Diet advancement, Labs, Weight trends, Skin, I & O's  REASON FOR ASSESSMENT:   Consult Assessment of nutrition requirement/status  ASSESSMENT:   Patient reports 2 day history of generalized weakness which resulted in patient falling out of bed 2 days ago. Developed hip pain from this fall which persisted into today. History of hip fracture and surgical repair. Over the last 2 days patient has had new onset wheezing and coughing with occasional phlegm production. Denies any fevers, chest pain, palpitations, LOC, nausea, vomiting. Patient does endorse runny nose and upper respiratory congestion. Very poor appetite during this period time. Very little oral intake is pushing fluids. Patient states that he smoked for 50 years approximately one pack per day but quit approximately 20 years ago.  Pt sitting up in bed at time of visit. He reports "I'm just an old man, laying here" and expressed frustration about still being in the hospital.   Pt was noted to be coughing on PO intake on 12/28/15 and was made NPO for a speech evaluation. Pt was advanced to a dysphagia 1 diet with nectar thick liquids on 12/29/15 and advanced to a dysphagia 1 diet with thin liquids after completion of MBSS on 12/31/15.   Pt reports he is tolerating diet well, however, unable to eat much at one time. He enjoys sweets such as liquids and ice cream, however, does not each much off his trays. He reports he consumed some sherbet prior to RD visit and his ice cream was "too slimy" on his  lunch tray. Meal completion 10-25%.   Discussed importance of good meal intake to promote healing. Pt amenable to Glucerna and Magic Cup supplements.   Noted pt with no BM since admission; suspect poor po intake is contributing to lack of BM.   CSW following. Therapy recommending SNF placement.   Labs reviewed: Na: 146, CBGS: 150-239.   Diet Order:  DIET - DYS 1 Room service appropriate?: Yes; Fluid consistency:: Thin  Skin:  Reviewed, no issues  Last BM:  PTA  Height:   Ht Readings from Last 1 Encounters:  12/25/15 5\' 9"  (1.753 m)    Weight:   Wt Readings from Last 1 Encounters:  01/01/16 229 lb 4.5 oz (104 kg)    Ideal Body Weight:  72.7 kg  BMI:  Body mass index is 33.84 kg/(m^2).  Estimated Nutritional Needs:   Kcal:  1800-2000  Protein:  85-100 grams  Fluid:  1.8-2.0 L  EDUCATION NEEDS:   Education needs addressed  Arisbeth Purrington A. Jimmye Norman, RD, LDN, CDE Pager: [phone] After hours Pager: [phone]

## 2016-01-01 NOTE — Progress Notes (Signed)
Patient Name: Eric [name] Date of Encounter: 01/01/2016  Active Problems:   HYPERCHOLESTEROLEMIA   HYPERTENSION, BENIGN SYSTEMIC   CKD (chronic kidney disease) stage 3, GFR 30-59 ml/min   Bronchitis   Acute respiratory distress ([name]   Diabetes mellitus with complication ([name]   AKI (acute kidney injury) ([place])   PAF (paroxysmal atrial fibrillation) ([name]   S/P CABG (coronary artery bypass graft)   S/P AVR (aortic valve replacement)   Atrial fibrillation with RVR ([name]   Multifocal atrial tachycardia ([name]   Sepsis ([name]   COPD exacerbation ([name]   Metabolic acidosis   Dilated cardiomyopathy ([name]   Pulmonary hypertension ([name]   CAD in native artery   H/O aortic valve replacement   Essential hypertension   Uncontrolled type 2 diabetes mellitus with complication ([name]   Acute on chronic renal failure ([name]   Hyponatremia   Hypotension   Acute renal failure superimposed on stage 3 chronic kidney disease ([place])   Delirium   Length of Stay: 7  SUBJECTIVE  A little more alert. Passed swallow study, but needs 100% supervision when eating.  CURRENT MEDS . antiseptic oral rinse  7 mL Mouth Rinse BID  . aspirin EC  81 mg Oral Daily  . budesonide (PULMICORT) nebulizer solution  0.25 mg Nebulization BID  . digoxin  0.125 mg Oral Daily  . heparin subcutaneous  5,000 Units Subcutaneous 3 times per day  . hydrocortisone sod succinate (SOLU-CORTEF) inj  50 mg Intravenous Q12H  . insulin aspart  0-20 Units Subcutaneous 6 times per day  . ipratropium  0.5 mg Nebulization TID  . levalbuterol  0.63 mg Nebulization TID  . pantoprazole  40 mg Oral BID  . simvastatin  40 mg Oral q1800  . sodium chloride flush  3 mL Intravenous Q12H    OBJECTIVE   Intake/Output Summary (Last 24 hours) at 01/01/16 1152 Last data filed at 01/01/16 0915  Gross per 24 hour  Intake   1640 ml  Output   1050 ml  Net    590 ml   Filed Weights   12/30/15 0500 12/31/15 0444 01/01/16 0407  Weight:  101.3 kg (223 lb 5.2 oz) 102 kg (224 lb 13.9 oz) 104 kg (229 lb 4.5 oz)    PHYSICAL EXAM Filed Vitals:   01/01/16 0407 01/01/16 0811 01/01/16 0828 01/01/16 0900  BP:  135/72    Pulse:  98  108  Temp:  98.5 F (36.9 C)    TempSrc:  Oral    Resp:  23  23  Height:      Weight: 104 kg (229 lb 4.5 oz)     SpO2:  96% 96% 96%   General: Drowsy, mildly disoriented, no distress Head: no evidence of trauma, PERRL, EOMI, no exophtalmos or lid lag, no myxedema, no xanthelasma; normal ears, nose and oropharynx Neck: normal jugular venous pulsations and no hepatojugular reflux; brisk carotid pulses without delay and no carotid bruits Chest: bilateral wheezing, no signs of consolidation by percussion or palpation, normal fremitus, symmetrical and full respiratory excursions Cardiovascular: normal position and quality of the apical impulse, irregular rhythm, normal first and second heart sounds, no rubs or gallops, no murmur Abdomen: no tenderness or distention, no masses by palpation, no abnormal pulsatility or arterial bruits, normal bowel sounds, no hepatosplenomegaly Extremities: no clubbing, cyanosis or edema;  Neurological: limited due to limited cooperation  LABS  CBC  Recent Labs  12/30/15 0503 12/31/15 0236  WBC 13.0* 9.8  NEUTROABS 12.3* 9.1*  HGB 9.2* 9.1*  HCT 28.8* 29.3*  MCV 92.9 93.3  PLT 228 [id number]   Basic Metabolic Panel  Recent Labs  12/30/15 0503 12/31/15 0236 01/01/16 0240  NA 143 145 146*  K 3.8 3.4* 3.5  CL 113* 115* 116*  CO2 21* 22 21*  GLUCOSE 158* 154* 143*  BUN 39* 26* 25*  CREATININE 1.92* 1.72* 1.67*  CALCIUM 8.5* 8.3* 8.4*  MG 1.8 1.7  --    Liver Function Tests  Recent Labs  12/30/15 0503 12/31/15 0236  AST 17 16  ALT 28 23  ALKPHOS 43 39  BILITOT 0.7 0.8  PROT 5.5* 5.3*  ALBUMIN 2.6* 2.4*    TELE AF with RVR 105-110 when asleep, 120-130s when moving around   ASSESSMENT AND PLAN   1. Atrial fibrillation with RVR (previous  atrial flutter/multifocal atrial tachycardia)  Switch to POmeds. Will try a tiny dose of the most selective beta blocker available (bisoprolol) and immediate relase diltiazem in addition to low dose digoxin.  Echo shows normal LV function and normally functioning prosthetic aortic valve; Possible severe MS, hard to assess due to rapid AF.  Not a good candidate for amiodarone given underlying lung problem. Avoid higher dig dose due to renal insufficiency. Not a godd beta blocker candidate due to COPD. Needs anticoagulation long term. However, with recent fall and now confusion, he would be high risk. Continue ASA for now and reassess as outpt.   2. COPD/bronchitis-continue bronchodilators, steroids and antibiotics. Management per primary care.  3. S/P bioprosthetic AVR - normal function on echo.  4. Acute on chronic kidney disease - Renal function improved slightly  5. CAD s/p CABG - continue ASA and statin; elevated troponin-likely related to atrial arrhythmias and renal insufficiency. No plans for further ischemia evaluation.  6. Confusion - seems to be improving.   Sanda Klein, MD, [facility] [phone] office [phone] pager 01/01/2016 11:52 AM

## 2016-01-01 NOTE — Progress Notes (Signed)
Speech Language Pathology Treatment: Dysphagia  Patient Details Name: Eric [name] MRN: [id number] DOB: December 02, 1926 Today's Date: 01/01/2016 Time: [id number] SLP Time Calculation (min) (ACUTE ONLY): 15 min  Assessment / Plan / Recommendation Clinical Impression  Pt more participatory, spontaneous today.  Reported by RN to eat safely at lunch, still little appetite. Lungs are diminished but clear.  Consumed sips of thin liquid with physical assist to bring cup to mouth.  There were no overt s/s of aspiration; min cues to slow rate of consumption.  RR low 20s, well under threshold for concerns re: swallowing/breathing coordination.  Recommend continuing conservative diet of dysphagia 1, thin liquids; full supervision and assistance with self feeding necessary.  SLP will continue to follow.    HPI HPI: 82 y.o. WM PMHx CVA, CAD native artery S/P CABG 2010, S/P bioprosthetic AV replacement 2010, HTN, HLD, DM Type 2, Bilateral hydrocele, C-spine Foraminal Stenosis. c/o a 2 day history of generalized weakness which led to him falling out of bed 2 days prior. He developed hip pain from this fall. He also noted the new onset wheezing and coughing with occasional phlegm production, and a runny nose w/ upper respiratory congestion. He also noted a very poor appetite with very little oral intake. Found to be septic with acute COPD exacerbation. Pt observed to cough at home with PO and after admission. CXR on 3/7 clear      SLP Plan  Continue with current plan of care     Recommendations  Diet recommendations: Dysphagia 1 (puree);Thin liquid Liquids provided via: Cup Medication Administration: Whole meds with puree Supervision: Staff to assist with self feeding Compensations: Slow rate;Small sips/bites Postural Changes and/or Swallow Maneuvers: Seated upright 90 degrees             Oral Care Recommendations: Oral care BID Follow up Recommendations: Skilled Nursing facility Plan: Continue with  current plan of care     Eric [name], [place] CCC/SLP Pager [phone]                 Eric [name] 01/01/2016, 4:18 PM

## 2016-01-01 NOTE — Progress Notes (Addendum)
ANTIBIOTIC CONSULT NOTE - INITIAL  Pharmacy Consult for levaquin Indication: COPD exacerbation  No Known Allergies  Patient Measurements: Height: 5\' 9"  (175.3 cm) Weight: 229 lb 4.5 oz (104 kg) IBW/kg (Calculated) : 70.7 Adjusted Body Weight:   Vital Signs: Temp: 97.5 F (36.4 C) (03/14 1652) Temp Source: Oral (03/14 1652) BP: 110/71 mmHg (03/14 1652) Pulse Rate: 114 (03/14 1652) Intake/Output from previous day: 03/13 0701 - 03/14 0700 In: 2200 [P.O.:350; I.V.:1700; IV Piggyback:150] Out: 1050 [Urine:1050] Intake/Output from this shift:    Labs:  Recent Labs  12/30/15 0503 12/31/15 0236 01/01/16 0240  WBC 13.0* 9.8  --   HGB 9.2* 9.1*  --   PLT 228 212  --   CREATININE 1.92* 1.72* 1.67*   Estimated Creatinine Clearance: 37 mL/min (by C-G formula based on Cr of 1.67). No results for input(s): VANCOTROUGH, VANCOPEAK, VANCORANDOM, GENTTROUGH, GENTPEAK, GENTRANDOM, TOBRATROUGH, TOBRAPEAK, TOBRARND, AMIKACINPEAK, AMIKACINTROU, AMIKACIN in the last 72 hours.   Microbiology: Recent Results (from the past 720 hour(s))  MRSA PCR Screening     Status: None   Collection Time: 12/25/15 10:28 PM  Result Value Ref Range Status   MRSA by PCR NEGATIVE NEGATIVE Final    Comment:        The GeneXpert MRSA Assay (FDA approved for NASAL specimens only), is one component of a comprehensive MRSA colonization surveillance program. It is not intended to diagnose MRSA infection nor to guide or monitor treatment for MRSA infections.   Culture, blood (routine x 2)     Status: None   Collection Time: 12/27/15  4:09 PM  Result Value Ref Range Status   Specimen Description BLOOD LEFT ANTECUBITAL  Final   Special Requests IN PEDIATRIC BOTTLE 3CC  Final   Culture NO GROWTH 5 DAYS  Final   Report Status 01/01/2016 FINAL  Final  Culture, blood (routine x 2)     Status: None   Collection Time: 12/27/15  4:15 PM  Result Value Ref Range Status   Specimen Description BLOOD LEFT HAND   Final   Special Requests IN PEDIATRIC BOTTLE 3CC  Final   Culture NO GROWTH 5 DAYS  Final   Report Status 01/01/2016 FINAL  Final  Culture, Urine     Status: None   Collection Time: 12/27/15  6:32 PM  Result Value Ref Range Status   Specimen Description URINE, RANDOM  Final   Special Requests NONE  Final   Culture NO GROWTH 2 DAYS  Final   Report Status 12/29/2015 FINAL  Final    Medical History: Past Medical History  Diagnosis Date  . Cerebrovascular disease, unspecified   . Coronary atherosclerosis of unspecified type of vessel, native or graft     s/p CABG 2010  . Esophageal reflux   . Pure hypercholesterolemia   . Essential hypertension, benign   . Type II or unspecified type diabetes mellitus with neurological manifestations, not stated as uncontrolled   . Bilateral hydrocele 3/11    [facility]  . History of aortic stenosis     s/p valve replacment 2010  . Cervicalgia   . Foraminal stenosis of cervical region     bilateral-C4-C5, C5-C6  . Colon polyp   . Shingles 07/27/2012    Medications:  Scheduled:  . antiseptic oral rinse  7 mL Mouth Rinse BID  . aspirin EC  81 mg Oral Daily  . atorvastatin  20 mg Oral q1800  . bisoprolol  2.5 mg Oral Daily  . budesonide (PULMICORT) nebulizer solution  0.25 mg Nebulization BID  . digoxin  0.125 mg Oral Daily  . diltiazem  120 mg Oral 3 times per day  . feeding supplement (GLUCERNA SHAKE)  237 mL Oral TID BM  . heparin subcutaneous  5,000 Units Subcutaneous 3 times per day  . [START ON 01/02/2016] hydrocortisone sod succinate (SOLU-CORTEF) inj  25 mg Intravenous Q12H  . insulin aspart  0-20 Units Subcutaneous 6 times per day  . ipratropium  0.5 mg Nebulization TID  . levalbuterol  0.63 mg Nebulization TID  . [START ON 01/02/2016] levofloxacin  750 mg Oral Once  . pantoprazole  40 mg Oral BID  . sodium chloride flush  3 mL Intravenous Q12H   Infusions:  . sodium chloride 50 mL/hr at 12/31/15 1745   Assessment: 82 yo male  with COPD exacerbation will be put on levaquin to finish a total of 7 days course.  1st dose was on 03/07 and last dose given was 03/13.  CrCl ~37  Goal of Therapy:  - resolution of infection  Plan:  - levaquin 750 mg PO x1 tomorrow at 1500 to finish up with the course  Traver Meckes, Tsz-Yin 01/01/2016,7:12 PM

## 2016-01-02 DIAGNOSIS — R410 Disorientation, unspecified: Secondary | ICD-10-CM

## 2016-01-02 DIAGNOSIS — I10 Essential (primary) hypertension: Secondary | ICD-10-CM

## 2016-01-02 LAB — GLUCOSE, CAPILLARY
GLUCOSE-CAPILLARY: 137 mg/dL — AB (ref 65–99)
GLUCOSE-CAPILLARY: 221 mg/dL — AB (ref 65–99)
GLUCOSE-CAPILLARY: 240 mg/dL — AB (ref 65–99)
Glucose-Capillary: 175 mg/dL — ABNORMAL HIGH (ref 65–99)
Glucose-Capillary: 181 mg/dL — ABNORMAL HIGH (ref 65–99)
Glucose-Capillary: 227 mg/dL — ABNORMAL HIGH (ref 65–99)

## 2016-01-02 MED ORDER — FUROSEMIDE 10 MG/ML IJ SOLN
40.0000 mg | Freq: Two times a day (BID) | INTRAMUSCULAR | Status: DC
  Administered 2016-01-02 – 2016-01-04 (×5): 40 mg via INTRAVENOUS
  Filled 2016-01-02 (×5): qty 4

## 2016-01-02 MED ORDER — INSULIN ASPART 100 UNIT/ML ~~LOC~~ SOLN
0.0000 [IU] | Freq: Every day | SUBCUTANEOUS | Status: DC
  Administered 2016-01-02 – 2016-01-03 (×2): 2 [IU] via SUBCUTANEOUS
  Administered 2016-01-04 – 2016-01-05 (×2): 3 [IU] via SUBCUTANEOUS
  Administered 2016-01-06: 5 [IU] via SUBCUTANEOUS
  Administered 2016-01-07 – 2016-01-09 (×3): 3 [IU] via SUBCUTANEOUS
  Administered 2016-01-10: 5 [IU] via SUBCUTANEOUS
  Administered 2016-01-11: 2 [IU] via SUBCUTANEOUS
  Administered 2016-01-12: 3 [IU] via SUBCUTANEOUS
  Administered 2016-01-13 – 2016-01-16 (×3): 2 [IU] via SUBCUTANEOUS

## 2016-01-02 MED ORDER — INSULIN ASPART 100 UNIT/ML ~~LOC~~ SOLN
0.0000 [IU] | Freq: Three times a day (TID) | SUBCUTANEOUS | Status: DC
  Administered 2016-01-02 (×2): 7 [IU] via SUBCUTANEOUS
  Administered 2016-01-03: 11 [IU] via SUBCUTANEOUS
  Administered 2016-01-03 – 2016-01-04 (×2): 7 [IU] via SUBCUTANEOUS
  Administered 2016-01-04: 15 [IU] via SUBCUTANEOUS
  Administered 2016-01-04: 11 [IU] via SUBCUTANEOUS

## 2016-01-02 MED ORDER — SODIUM CHLORIDE 0.45 % IV SOLN
INTRAVENOUS | Status: DC
  Administered 2016-01-02 (×2): via INTRAVENOUS

## 2016-01-02 MED ORDER — POTASSIUM CHLORIDE CRYS ER 20 MEQ PO TBCR
20.0000 meq | EXTENDED_RELEASE_TABLET | Freq: Two times a day (BID) | ORAL | Status: DC
  Administered 2016-01-02 – 2016-01-05 (×8): 20 meq via ORAL
  Filled 2016-01-02 (×8): qty 1

## 2016-01-02 NOTE — Progress Notes (Addendum)
Eric TEAM PROGRESS NOTE  KENTRELL HALLAHAN [id number] DOB: 1927/06/20 DOA: 12/25/2015 PCP: [name], MD  Admit HPI / Brief Narrative: 82 y.o. male who c/o a 2 day history of generalized weakness which lead to him falling out of bed 2 days prior. He developed hip pain from this fall. He also noted the new onset wheezing and coughing with occasional phlegm production, and a runny nose w/ upper respiratory congestion. He also noted a very poor appetite with very little oral intake.   He came to the ER and was found to be tachycardic (had P waves - was irregular - possible MAT).  He was treated with nebs with improvement of resp failure and tightness. His HR continued to climb to 135. He was given Cardizem 20 mg with BP dropping after. He was given a fluid bolus, and a dilt drip at 5 mg was begun.   HPI/Subjective: The patient is alert and conversant and less confused today.  He is up in a chair.  He denies cp, n/v, or abdom pain.  He states he is tired in general, and becomes sob when he exerts himself.  He is determined to go home, and not to a SNF as PT/OT have suggested.    Assessment/Plan:  SIRS  On admission patient met guidelines for SIRS w/ HR> 90, RR> 20, WBC> 12 - no infiltrate on CXR - no + culture results - UA not c/w UTI - most c/w SIRS and NOT SEPSIS   Acute COPD exac - acute hypoxic resp failure  No infiltrate on CXR - slowly improving, but volume overload likely contributing to increased O2 requirement at this time - will likely require home O2 - check ambulatory sat after diuresis - no wheezing on exam today   Tachyarrhythmia - Sinus Tachy w/ non-conducted PACs v/s MAT > Aflutter > A fib w/ RVR Cardiology following - IV cardizem now transitioned to oral - TSH normal - high risk for anticoag given recent falls - Cards suggests ASA for now and reconsider anticaog in outpt f/u - no amio due to severe lung disease   Acute mental status  change -Multifactorial to include SIRS, Ativan, steroids, acute on chronic renal failure, sundowning  -CT head unrevealing  -beginning to improve significantly   Dysphagia Cleared for D1 diet w/ thin liquids per SLP   DM2 - acutely severely uncontrolled  A1c 7.1 - required insulin gtt earlier in stay - CBG reasonably controlled - follow w/o change today   N/V - resolved  KUB noted gaseous distention of stomach on BIPAP - suspect bloating was mostly air blown in w/ BIPAP - simethicone - resolved   Acute on CKD Stage 3 Baseline creatinine 1.6 - creatinine at presentation 2.58 w/ peak of 3.12 - avoid ACE/ARB - crt now at ~baseline  Filed Weights   12/31/15 0444 01/01/16 0407 01/02/16 0421  Weight: 102 kg (224 lb 13.9 oz) 104 kg (229 lb 4.5 oz) 106.6 kg (235 lb 0.2 oz)    Hyponatremia Appears to have been due to hypovolemia and hyperglycemia - resolved   Coronary artery disease status post CABG x3 2010 Cont ASA + lipid med  Aortic stenosis status post valve replacement 2010 Pericardial tissue valve stable on TTE   Carotid artery stenosis  Dopplers May 2015 showed a 60-79% right and 1-39% left stenosis  Hyperlipidemia Cont med tx  GERD  HTN Not an active issue at this time   Code Status: DO  NOT RESUSCITATE Family Communication: no family present at time of exam Disposition Plan: transfer to tele bed - ambulate w/ PT/OT - ambulatory sat eval - encourage consideration of SNF, but anticipate pt will refuse and ultimately d/c home   Consultants: [facility] Cards   Procedures: 3/9 TTE - EF 60-65% - Aortic valve: A bioprosthesis was present and functioning normally - Mitral valve: severe stenosis. Mean gradient (D): 11 mm Hg - Right ventricle: size was normal. Wall thickness normal. Systolic function was normal - Tricuspid valve: trivial regurgitation.  Antibiotics: levaquin 3/7 > 3/13  DVT prophylaxis: Subcutaneous heparin  Objective: Blood pressure 123/68, pulse 94,  temperature 98.5 F (36.9 C), temperature source Oral, resp. rate 16, height _0  (1.753 m), weight 106.6 kg (235 lb 0.2 oz), SpO2 98 %.  Intake/Output Summary (Last 24 hours) at 01/02/16 1118 Last data filed at 01/02/16 0500  Gross per 24 hour  Intake  817.5 ml  Output    625 ml  Net  192.5 ml   Filed Weights   12/31/15 0444 01/01/16 0407 01/02/16 0421  Weight: 102 kg (224 lb 13.9 oz) 104 kg (229 lb 4.5 oz) 106.6 kg (235 lb 0.2 oz)   Exam: General: alert and conversant - oriented   Lungs: distant bs th/o - no active wheeze - mild basilar crackles  Cardiovascular: reg rate at 90bpm - no signif M - no rub  Abdomen: NT/ND, soft, bs+, no mass, no rebound  Extremities: no significant cyanosis, or clubbing - 2+ edema bilateral lower extremities  Data Reviewed:  Basic Metabolic Panel:  Recent Labs Lab 12/28/15 0300 12/29/15 0243 12/30/15 0503 12/31/15 0236 01/01/16 0240  NA 137 142 143 145 146*  K 4.3 4.0 3.8 3.4* 3.5  CL 109 115* 113* 115* 116*  CO2 17* 18* 21* 22 21*  GLUCOSE 229* 144* 158* 154* 143*  BUN 55* 52* 39* 26* 25*  CREATININE 2.22* 2.10* 1.92* 1.72* 1.67*  CALCIUM 8.8* 8.8* 8.5* 8.3* 8.4*  MG 2.2 2.1 1.8 1.7  --     CBC:  Recent Labs Lab 12/27/15 0440 12/28/15 0300 12/29/15 0243 12/30/15 0503 12/31/15 0236  WBC 15.5* 12.4* 13.2* 13.0* 9.8  NEUTROABS  --  12.0* 12.6* 12.3* 9.1*  HGB 9.3* 9.8* 9.5* 9.2* 9.1*  HCT 27.8* 31.3* 28.4* 28.8* 29.3*  MCV 91.7 92.1 91.6 92.9 93.3  PLT 136* 170 193 228 212   Liver Function Tests:  Recent Labs Lab 12/27/15 0440 12/28/15 0300 12/29/15 0243 12/30/15 0503 12/31/15 0236  AST 54* 39 _1 ALT 40 43 34 28 23  ALKPHOS 53 53 46 43 39  BILITOT 0.8 0.7 0.6 0.7 0.8  PROT 6.0* 6.0* 5.7* 5.5* 5.3*  ALBUMIN 2.6* 2.7* 2.8* 2.6* 2.4*   CBG:  Recent Labs Lab 01/01/16 1654 01/01/16 2157 01/02/16 0002 01/02/16 0427 01/02/16 0853  GLUCAP 184* 222* 175* 137* 181*    Recent Results (from the past 240  hour(s))  MRSA PCR Screening     Status: [name]   Collection Time: 12/25/15 10:28 PM  Result Value Ref Range Status   MRSA by PCR NEGATIVE NEGATIVE Final    Comment:        The GeneXpert MRSA Assay (FDA approved for NASAL specimens only), is one component of a comprehensive MRSA colonization surveillance program. It is not intended to diagnose MRSA infection nor to guide or monitor treatment for MRSA infections.   Culture, blood (routine x 2)     Status: [name]  Collection Time: 12/27/15  4:09 PM  Result Value Ref Range Status   Specimen Description BLOOD LEFT ANTECUBITAL  Final   Special Requests IN PEDIATRIC BOTTLE 3CC  Final   Culture NO GROWTH 5 DAYS  Final   Report Status 01/01/2016 FINAL  Final  Culture, blood (routine x 2)     Status: [name]   Collection Time: 12/27/15  4:15 PM  Result Value Ref Range Status   Specimen Description BLOOD LEFT HAND  Final   Special Requests IN PEDIATRIC BOTTLE 3CC  Final   Culture NO GROWTH 5 DAYS  Final   Report Status 01/01/2016 FINAL  Final  Culture, Urine     Status: [name]   Collection Time: 12/27/15  6:32 PM  Result Value Ref Range Status   Specimen Description URINE, RANDOM  Final   Special Requests [name]  Final   Culture NO GROWTH 2 DAYS  Final   Report Status 12/29/2015 FINAL  Final    Studies:   Recent x-ray studies have been reviewed in detail by the Attending Physician  Scheduled Meds:  Scheduled Meds: . antiseptic oral rinse  7 mL Mouth Rinse BID  . aspirin EC  81 mg Oral Daily  . atorvastatin  20 mg Oral q1800  . bisoprolol  2.5 mg Oral Daily  . budesonide (PULMICORT) nebulizer solution  0.25 mg Nebulization BID  . digoxin  0.125 mg Oral Daily  . diltiazem  120 mg Oral 3 times per day  . feeding supplement (GLUCERNA SHAKE)  237 mL Oral TID BM  . heparin subcutaneous  5,000 Units Subcutaneous 3 times per day  . hydrocortisone sod succinate (SOLU-CORTEF) inj  25 mg Intravenous Q12H  . insulin aspart  0-20 Units  Subcutaneous 6 times per day  . ipratropium  0.5 mg Nebulization TID  . levalbuterol  0.63 mg Nebulization TID  . levofloxacin  750 mg Oral Once  . pantoprazole  40 mg Oral BID  . sodium chloride flush  3 mL Intravenous Q12H    Time spent on care of this patient: 35 mins   MCCLUNG,JEFFREY T , MD   [facility] Office  [phone] Pager - Text Page per Shea Evans as per below:  On-Call/Text Page:      [email or web address]      password [email or web address]  If 7PM-7AM, please contact night-coverage [email or web address] Password [email or web address] 01/02/2016, 11:18 AM   LOS: 8 days

## 2016-01-02 NOTE — Progress Notes (Signed)
phy there rec snf but pt states he prefers to go home. Pt states his son lives w pt. States has a lot of eq. Da and pt did agree for [facility] place for snf for rehab.

## 2016-01-02 NOTE — Clinical Social Work Note (Signed)
CSW spoke to patient's daughter Jerold Coombe and presented bed offers.  Patient's daughter would like patient to go to [facility] once he is medically ready for discharge and orders have been received.  CSW to continue to follow patient's progress.  Jones Broom. [place], MSW, [place] 01/02/2016 2:23 PM

## 2016-01-02 NOTE — Progress Notes (Signed)
Patient Name: Eric [name] Date of Encounter: 01/02/2016  Active Problems:   HYPERCHOLESTEROLEMIA   HYPERTENSION, BENIGN SYSTEMIC   CKD (chronic kidney disease) stage 3, GFR 30-59 ml/min   Bronchitis   Acute respiratory distress ([name]   Diabetes mellitus with complication ([name]   AKI (acute kidney injury) ([place])   PAF (paroxysmal atrial fibrillation) ([name]   S/P CABG (coronary artery bypass graft)   S/P AVR (aortic valve replacement)   Atrial fibrillation with RVR ([name]   Multifocal atrial tachycardia ([name]   Sepsis ([name]   COPD exacerbation ([name]   Metabolic acidosis   Dilated cardiomyopathy ([name]   Pulmonary hypertension ([name]   CAD in native artery   H/O aortic valve replacement   Essential hypertension   Uncontrolled type 2 diabetes mellitus with complication ([name]   Acute on chronic renal failure ([name]   Hyponatremia   Hypotension   Acute renal failure superimposed on stage 3 chronic kidney disease ([place])   Delirium   Length of Stay: 8  SUBJECTIVE  Seems more alert, a little stronger today.   CURRENT MEDS . antiseptic oral rinse  7 mL Mouth Rinse BID  . aspirin EC  81 mg Oral Daily  . atorvastatin  20 mg Oral q1800  . bisoprolol  2.5 mg Oral Daily  . budesonide (PULMICORT) nebulizer solution  0.25 mg Nebulization BID  . digoxin  0.125 mg Oral Daily  . diltiazem  120 mg Oral 3 times per day  . feeding supplement (GLUCERNA SHAKE)  237 mL Oral TID BM  . heparin subcutaneous  5,000 Units Subcutaneous 3 times per day  . hydrocortisone sod succinate (SOLU-CORTEF) inj  25 mg Intravenous Q12H  . insulin aspart  0-20 Units Subcutaneous 6 times per day  . ipratropium  0.5 mg Nebulization TID  . levalbuterol  0.63 mg Nebulization TID  . levofloxacin  750 mg Oral Once  . pantoprazole  40 mg Oral BID  . sodium chloride flush  3 mL Intravenous Q12H    OBJECTIVE   Intake/Output Summary (Last 24 hours) at 01/02/16 1049 Last data filed at 01/02/16 0500  Gross per  24 hour  Intake 1027.5 ml  Output    625 ml  Net  402.5 ml   Filed Weights   12/31/15 0444 01/01/16 0407 01/02/16 0421  Weight: 102 kg (224 lb 13.9 oz) 104 kg (229 lb 4.5 oz) 106.6 kg (235 lb 0.2 oz)    PHYSICAL EXAM Filed Vitals:   01/02/16 0421 01/02/16 0514 01/02/16 0832 01/02/16 0849  BP: 132/74 132/74  123/68  Pulse: 90   94  Temp: 98.6 F (37 C)   98.5 F (36.9 C)  TempSrc: Oral   Oral  Resp:    16  Height:      Weight: 106.6 kg (235 lb 0.2 oz)     SpO2: 94%  98% 98%   General: Alert, oriented, no distress Head: no evidence of trauma, PERRL, EOMI, no exophtalmos or lid lag, no myxedema, no xanthelasma; normal ears, nose and oropharynx Neck: normal jugular venous pulsations and no hepatojugular reflux; brisk carotid pulses without delay and no carotid bruits Chest: bilateral wheezing, no signs of consolidation by percussion or palpation, normal fremitus, symmetrical and full respiratory excursions Cardiovascular: normal position and quality of the apical impulse, irregular rhythm, normal first and second heart sounds, no rubs or gallops, no murmur Abdomen: no tenderness or distention, no masses by palpation, no abnormal pulsatility or arterial bruits, normal bowel sounds, no hepatosplenomegaly  Extremities: no clubbing, cyanosis or edema;  Neurological: grossly nonfocal  LABS  CBC  Recent Labs  12/31/15 0236  WBC 9.8  NEUTROABS 9.1*  HGB 9.1*  HCT 29.3*  MCV 93.3  PLT 99991111   Basic Metabolic Panel  Recent Labs  12/31/15 0236 01/01/16 0240  NA 145 146*  K 3.4* 3.5  CL 115* 116*  CO2 22 21*  GLUCOSE 154* 143*  BUN 26* 25*  CREATININE 1.72* 1.67*  CALCIUM 8.3* 8.4*  MG 1.7  --    Liver Function Tests  Recent Labs  12/31/15 0236  AST 16  ALT 23  ALKPHOS 39  BILITOT 0.8  PROT 5.3*  ALBUMIN 2.4*    Radiology Studies Imaging results have been reviewed  TELE AF with V rates 80s-90s   ASSESSMENT AND PLAN  1. Atrial fibrillation with RVR  (previous atrial flutter/multifocal atrial tachycardia)  Improved rate control.  Echo shows normal LV function and normally functioning prosthetic aortic valve; Possible severe MS, hard to assess due to rapid AF (repeat limited echo today).  Needs anticoagulation long term. However, with recent fall and now confusion, he would be high risk. Continue ASA for now and reassess as outpt. Until he can receive anticoagulation, he is not a candidate for cardioversion.  2. COPD/bronchitis-continue bronchodilators, steroids and antibiotics. Management per primary care. Seems to tolerate bisoprolol without worsened wheezing  3. S/P bioprosthetic AVR - normal function on echo.  4. Acute on chronic kidney disease - Renal function improving slowly  5. CAD s/p CABG - continue ASA and statin; elevated troponin-likely related to atrial arrhythmias and renal insufficiency. No plans for further ischemia evaluation.  6. Confusion - marked improvement.   Sanda Klein, MD, [facility] [phone] office [phone] pager 01/02/2016 10:49 AM

## 2016-01-02 NOTE — Progress Notes (Signed)
Inpatient Diabetes Program Recommendations  AACE/ADA: New Consensus Statement on Inpatient Glycemic Control (2015)  Target Ranges:  Prepandial:   less than 140 mg/dL      Peak postprandial:   less than 180 mg/dL (1-2 hours)      Critically ill patients:  140 - 180 mg/dL   Review of Glycemic ControlResults for MACIEJ, DILLOW (MRN [id number]) as of 01/02/2016 12:44  Ref. Range 01/01/2016 21:57 01/02/2016 00:02 01/02/2016 04:27 01/02/2016 08:53 01/02/2016 11:40  Glucose-Capillary Latest Ref Range: 65-99 mg/dL 222 (H) 175 (H) 137 (H) 181 (H) 221 (H)    Diabetes history: Type 2 diabetes Outpatient Diabetes medications: Lantus 28 units QHS                             Glipizide 5 mg daily Current orders for Inpatient glycemic control:  Novolog resistant tid with meals and HS  Inpatient Diabetes Program Recommendations:  Please consider restarting Lantus 14 units daily (1/2 of home dose).  Thanks, Adah Perl, RN, BC-ADM Inpatient Diabetes Coordinator Pager [phone] (8a-5p)

## 2016-01-02 NOTE — Progress Notes (Signed)
Physical Therapy Treatment Patient Details Name: ZADEN NASCA MRN: [id number] DOB: 09-07-1927 Today's Date: 01/02/2016    History of Present Illness 82 yo presented with 2 day history of generalized weakness which resulted in patient falling out of bed 2 days ago. Developed hip pain from this fall which persisted. History of hip fracture and surgical repair. patient has had new onset wheezing and coughing with occasional phlegm production. pt presents with A-flutter, COPD Exacerbation, and N/V.  pt with hx of AVR, CABG, HTN, DM, Shingles, and L THR.  Pt with A-fib with RVR. COPD/Bronchitis.     PT Comments    Making slow functional gains; able to walk this session; on supplemental O2, 4 L and O2 sats remained greater than or equal to 97%  Follow Up Recommendations  SNF     Equipment Recommendations  None recommended by PT    Recommendations for Other Services       Precautions / Restrictions Precautions Precautions: Fall Precaution Comments: watch O2 Sats`    Mobility  Bed Mobility Overal bed mobility: Needs Assistance;+2 for physical assistance Bed Mobility: Rolling;Sidelying to Sit Rolling: Mod assist Sidelying to sit: Mod assist       General bed mobility comments: pt needs A with Bil LEs and bringing trunk up to sitting; posterior bias initially upon sitting , but much improved from PT eval  Transfers Overall transfer level: Needs assistance Equipment used: Rolling walker (2 wheeled) Transfers: Sit to/from Stand Sit to Stand: +2 physical assistance;Mod assist         General transfer comment: Cues for hand placement, and weight shift anteriorly as well as to push through feet for sit to stand; mod assist to power up  Ambulation/Gait Ambulation/Gait assistance: Mod assist;+2 physical assistance Ambulation Distance (Feet): 6 Feet Assistive device: Rolling walker (2 wheeled) Gait Pattern/deviations: Decreased step length - right;Decreased step length -  left;Trunk flexed     General Gait Details: Shuffling gait and fatigues quickly; bilateral support given and third person pushed chair behind   Stairs            Wheelchair Mobility    Modified Rankin (Stroke Patients Only)       Balance     Sitting balance-Leahy Scale: Fair       Standing balance-Leahy Scale: Zero (approaching Poor)                      Cognition Arousal/Alertness: Awake/alert Behavior During Therapy: WFL for tasks assessed/performed Overall Cognitive Status: No family/caregiver present to determine baseline cognitive functioning                      Exercises      General Comments        Pertinent Vitals/Pain Pain Assessment: No/denies pain    Home Living                      Prior Function            PT Goals (current goals can now be found in the care plan section) Acute Rehab PT Goals Patient Stated Goal: get stronger and go home PT Goal Formulation: With patient Time For Goal Achievement: 01/09/16 Potential to Achieve Goals: Good Progress towards PT goals: Progressing toward goals    Frequency  Min 2X/week    PT Plan Current plan remains appropriate    Co-evaluation  End of Session Equipment Utilized During Treatment: Gait belt Activity Tolerance: Patient limited by fatigue Patient left: in chair;with call bell/phone within reach;with nursing/sitter in room     Time: 1025-1043 PT Time Calculation (min) (ACUTE ONLY): 18 min  Charges:  $Gait Training: 8-22 mins                    G Codes:      Quin Hoop 01/02/2016, 2:11 PM  Roney Marion, [place] Pager [phone] Office [phone]

## 2016-01-02 NOTE — Care Management Important Message (Signed)
Important Message  Patient Details  Name: Eric [name] MRN: [id number] Date of Birth: 07-30-1927   Medicare Important Message Given:  Yes    Elery Cadenhead P Portia 01/02/2016, 10:56 AM

## 2016-01-03 ENCOUNTER — Inpatient Hospital Stay (HOSPITAL_COMMUNITY): Payer: Medicare Other

## 2016-01-03 DIAGNOSIS — I340 Nonrheumatic mitral (valve) insufficiency: Secondary | ICD-10-CM

## 2016-01-03 DIAGNOSIS — N179 Acute kidney failure, unspecified: Secondary | ICD-10-CM | POA: Insufficient documentation

## 2016-01-03 LAB — GLUCOSE, CAPILLARY
GLUCOSE-CAPILLARY: 243 mg/dL — AB (ref 65–99)
Glucose-Capillary: 260 mg/dL — ABNORMAL HIGH (ref 65–99)
Glucose-Capillary: 146 mg/dL — ABNORMAL HIGH (ref 65–99)
Glucose-Capillary: 246 mg/dL — ABNORMAL HIGH (ref 65–99)

## 2016-01-03 LAB — BASIC METABOLIC PANEL
ANION GAP: 8 (ref 5–15)
BUN: 23 mg/dL — ABNORMAL HIGH (ref 6–20)
CHLORIDE: 105 mmol/L (ref 101–111)
CO2: 29 mmol/L (ref 22–32)
Calcium: 8.3 mg/dL — ABNORMAL LOW (ref 8.9–10.3)
Creatinine, Ser: 1.86 mg/dL — ABNORMAL HIGH (ref 0.61–1.24)
GFR calc Af Amer: 36 mL/min — ABNORMAL LOW (ref >60)
GFR calc non Af Amer: 31 mL/min — ABNORMAL LOW (ref >60)
GLUCOSE: 198 mg/dL — AB (ref 65–99)
POTASSIUM: 2.9 mmol/L — AB (ref 3.5–5.1)
Sodium: 142 mmol/L (ref 135–145)

## 2016-01-03 LAB — ECHOCARDIOGRAM LIMITED
HEIGHTINCHES: 69 in
WEIGHTICAEL: 3598.4 [oz_av]

## 2016-01-03 MED ORDER — POTASSIUM CHLORIDE CRYS ER 20 MEQ PO TBCR
40.0000 meq | EXTENDED_RELEASE_TABLET | Freq: Once | ORAL | Status: AC
  Administered 2016-01-03: 40 meq via ORAL
  Filled 2016-01-03: qty 2

## 2016-01-03 MED ORDER — DIGOXIN 125 MCG PO TABS
0.0625 mg | ORAL_TABLET | Freq: Every day | ORAL | Status: DC
  Administered 2016-01-04 – 2016-01-05 (×2): 0.0625 mg via ORAL
  Filled 2016-01-03 (×2): qty 1

## 2016-01-03 MED ORDER — INSULIN GLARGINE 100 UNIT/ML ~~LOC~~ SOLN
10.0000 [IU] | Freq: Every day | SUBCUTANEOUS | Status: DC
  Administered 2016-01-03 – 2016-01-06 (×4): 10 [IU] via SUBCUTANEOUS
  Filled 2016-01-03 (×4): qty 0.1

## 2016-01-03 MED ORDER — PREDNISONE 20 MG PO TABS
40.0000 mg | ORAL_TABLET | Freq: Every day | ORAL | Status: DC
  Administered 2016-01-03 – 2016-01-04 (×2): 40 mg via ORAL
  Filled 2016-01-03 (×2): qty 2

## 2016-01-03 MED ORDER — POLYETHYLENE GLYCOL 3350 17 G PO PACK
17.0000 g | PACK | Freq: Two times a day (BID) | ORAL | Status: DC
  Administered 2016-01-03 – 2016-01-17 (×14): 17 g via ORAL
  Filled 2016-01-03 (×20): qty 1

## 2016-01-03 NOTE — Progress Notes (Signed)
Patient Name: Eric [name] Date of Encounter: 01/03/2016  Hospital Problem List     Principal Problem:   Acute respiratory distress [facility]) Active Problems:   Bronchitis   Atrial fibrillation with RVR ([name]   Multifocal atrial tachycardia ([name]   Sepsis ([name]   COPD exacerbation ([name]   Metabolic acidosis   Hypotension   Diabetes mellitus with complication ([name]   S/P CABG (coronary artery bypass graft)   S/P AVR (aortic valve replacement)   Pulmonary hypertension ([name]   CAD in native artery   Uncontrolled type 2 diabetes mellitus with complication ([name]   Hyponatremia   Acute renal failure superimposed on stage 3 chronic kidney disease ([place])   Delirium   HYPERCHOLESTEROLEMIA   Dilated cardiomyopathy ([name]   H/O aortic valve replacement    Subjective   Lucid this AM.  Feels like breathing may be slightly better than yesterday.  Coughing some - productive with brownish sputum.  No c/p or palpitations.  Remains in AF - 80's.  Inpatient Medications    . antiseptic oral rinse  7 mL Mouth Rinse BID  . aspirin EC  81 mg Oral Daily  . atorvastatin  20 mg Oral q1800  . bisoprolol  2.5 mg Oral Daily  . budesonide (PULMICORT) nebulizer solution  0.25 mg Nebulization BID  . digoxin  0.125 mg Oral Daily  . diltiazem  120 mg Oral 3 times per day  . feeding supplement (GLUCERNA SHAKE)  237 mL Oral TID BM  . furosemide  40 mg Intravenous BID  . heparin subcutaneous  5,000 Units Subcutaneous 3 times per day  . insulin aspart  0-20 Units Subcutaneous TID WC  . insulin aspart  0-5 Units Subcutaneous QHS  . ipratropium  0.5 mg Nebulization TID  . levalbuterol  0.63 mg Nebulization TID  . pantoprazole  40 mg Oral BID  . potassium chloride  20 mEq Oral BID  . sodium chloride flush  3 mL Intravenous Q12H    Vital Signs    Filed Vitals:   01/02/16 2032 01/02/16 2136 01/03/16 0203 01/03/16 0616  BP:   125/77 114/61  Pulse:   92 87  Temp:   97.9 F (36.6 C) 98 F (36.7 C)    TempSrc:   Oral Oral  Resp:    20  Height:      Weight: 226 lb 14.4 oz (102.921 kg)   224 lb 14.4 oz (102.014 kg)  SpO2:  94% 95% 97%    Intake/Output Summary (Last 24 hours) at 01/03/16 0750 Last data filed at 01/03/16 0616  Gross per 24 hour  Intake   1912 ml  Output   2425 ml  Net   -513 ml   Filed Weights   01/02/16 0421 01/02/16 2032 01/03/16 0616  Weight: 235 lb 0.2 oz (106.6 kg) 226 lb 14.4 oz (102.921 kg) 224 lb 14.4 oz (102.014 kg)    Physical Exam    General: Pleasant, NAD. Neuro: Alert and oriented X 3. Moves all extremities spontaneously. Psych: Normal affect. HEENT:  Normal  Neck: Supple without bruits.  JVP ~ 12cm. Lungs:  Resp regular and unlabored, diminished breath sounds bilat - more so on L, bibasilar crackles, diffuse insp/exp wheezing. Heart: IR, IR, no s3, s4, or murmurs. Abdomen: firm, protuberant, NT, BS + x 4.  Extremities: No clubbing, cyanosis.  Trace bilat LE edema. DP/PT/Radials 1+ and equal bilaterally.  Labs    Basic Metabolic Panel  Recent Labs  01/01/16 0240  NA  146*  K 3.5  CL 116*  CO2 21*  GLUCOSE 143*  BUN 25*  CREATININE 1.67*  CALCIUM 8.4*    Telemetry    AF 80's, pvc's.   Assessment & Plan    ASSESSMENT AND PLAN  1. Atrial fibrillation with RVR (previous atrial flutter/multifocal atrial tachycardia)   Rate stable in the 80's on  blocker, digoxin, dilt. Asymptomatic currently. Echo shows normal LV function and normally functioning prosthetic aortic valve; Possible severe MS, hard to assess due to rapid AF (repeat limited echo today).   Needs anticoagulation long term (CHA2DS2VASc = 5) but would be high risk in setting of recent fall and also confusion, though confusion/delirium appears to have cleared.   Continue ASA for now and reassess as outpt. Until he can receive anticoagulation, he is not a candidate for cardioversion.  2. COPD/bronchitis- Still with very diminished/coarse breath sounds and diffuse  wheezing. -continue bronchodilators, steroids and antibiotics. Management per primary care. Seems to tolerate bisoprolol without worsened wheezing  3. S/P bioprosthetic AVR - normal function on echo.  4. Acute on chronic kidney disease - Creat stable on 3/14.  Follow up today in setting of ongoing diuresis.  5. CAD s/p CABG - continue ASA and statin; elevated troponin-likely related to atrial arrhythmias and renal insufficiency. No plans for further ischemia evaluation.  6. Confusion/delirium - marked improvement.  7.  Acute Diastolic CHF:  Net + 6L this admission.  Wt had jumped to 235 on 3/14.  Has been receiving IV lasix and was net neg 513 ml yesterday.  Wt down to 224 this AM.  Still with crackles, mild JVP, distended abd, and trace lower ext edema.  F/U bmet this AM. Cont IV lasix.  HR/BP stable.   Signed, Murray Hodgkins NP  I have seen and examined the patient along with Murray Hodgkins NP.  I have reviewed the chart, notes and new data.  I agree with NP's note.  Key new complaints: more alert, breathing better Key examination changes: elevated JVP, mild edema, irregular rhythm with good rate control Key new findings / data: weight 12 lb above admission; creat 1.8 up from yesterday 1.6, but better than admission 2.2  PLAN: Unclear where optimal fluid balance lies (probably higher than admission 212 lb, but lower than today 224 lb). One more day of IV diuretics, then switch to PO.  Current rate control regimen seems to be working well. I wonder whether we could stop dig (potentially risky with his poor and volatile renal function). Will reduce dig dose and try to DC altogether.  Sanda Klein, MD, [place] [phone] 01/03/2016, 11:53 AM

## 2016-01-03 NOTE — Consult Note (Signed)
   [facility] CM Inpatient Consult   01/03/2016  Eric [name] June 24, 1927 [id number]   Patient screened for potential [facility] services this morning for progression rounds. Patient transferred from [facility] unit with COPD exacerbation. Patient is eligible for [facility]. In rounds patient was initially for a skilled facility for rehab.  Inpatient CSW reveals patient's discharge plan is home with home health as patient is declining to go to a skilled facility.   Will follow up with inpatient [facility] post hospital needs change please place a [place] [facility] consult. For questions please contact:   Natividad Brood, RN BSN [place] [facility] Liaison  [phone] business mobile phone Toll free office [phone]

## 2016-01-03 NOTE — Progress Notes (Cosign Needed)
BSW intern followed up with patient about facility placement. Patient denied SNF and is returning home with home health setup. Between patient  daughter and son patient  has 24 hour assistance at home.  BSW intern followed up with case manager Hassan Rowan to aware her of patient decision.  No further needs at this time...  BSW intern is logging off.Marland KitchenMarland Kitchen

## 2016-01-03 NOTE — Progress Notes (Signed)
OT Cancellation Note  Patient Details Name: ELISEY HALBERG MRN: [id number] DOB: Feb 05, 1927   Cancelled Treatment:    Reason Eval/Treat Not Completed: Patient at procedure or test/ unavailable  Kelley, Thereasa Parkin 01/03/2016, 1:37 PM

## 2016-01-03 NOTE — Progress Notes (Signed)
  Echocardiogram 2D Echocardiogram has been performed.  Eric [name] 01/03/2016, 1:28 PM

## 2016-01-03 NOTE — Progress Notes (Signed)
Inpatient Diabetes Program Recommendations  AACE/ADA: New Consensus Statement on Inpatient Glycemic Control (2015)  Target Ranges:  Prepandial:   less than 140 mg/dL      Peak postprandial:   less than 180 mg/dL (1-2 hours)      Critically ill patients:  140 - 180 mg/dL   Review of Glycemic Control  Results for Eric [name], Eric [name] (MRN [id number]) as of 01/03/2016 08:30  Ref. Range 01/02/2016 08:53 01/02/2016 11:40 01/02/2016 16:55 01/02/2016 22:37 01/03/2016 06:10  Glucose-Capillary Latest Ref Range: 65-99 mg/dL 181 (H) 221 (H) 227 (H) 240 (H) 260 (H)    Diabetes history: Type 2 diabetes Outpatient Diabetes medications: Lantus 28 units QHS  Glipizide 5 mg daily  Current orders for Inpatient glycemic control: Novolog resistant tid with meals and HS  Inpatient Diabetes Program Recommendations:   Please consider restarting Lantus 14 units daily (1/2 of home dose).  Gentry Fitz, RN, BA, MHA, CDE Diabetes Coordinator Inpatient Diabetes Program  [phone] (Team Pager) [phone] ([place]) 01/03/2016 8:31 AM

## 2016-01-03 NOTE — Clinical Social Work Note (Signed)
Patient transferred to 3E02 from 2H18, this CSW gave handoff to unit CSW, this CSW to sign off.  Eric [name]. [place], MSW, [place]

## 2016-01-03 NOTE — Progress Notes (Signed)
[place] TEAM PROGRESS NOTE  Eric [name] [id number] DOB: [birth year] DOA: 12/25/2015 PCP: [name], MD  Admit HPI / Brief Narrative: 82 y.o. male who c/o a 2 day history of generalized weakness which lead to him falling out of bed 2 days prior. He developed hip pain from this fall. He also noted the new onset wheezing and coughing with occasional phlegm production, and a runny nose w/ upper respiratory congestion. He also noted a very poor appetite with very little oral intake.   He came to the ER and was found to be tachycardic (had P waves - was irregular - possible MAT).  He was treated with nebs with improvement of resp failure and tightness. His HR continued to climb to 135. He was given Cardizem 20 mg with BP dropping after. He was given a fluid bolus, and a dilt drip at 5 mg was begun.   HPI/Subjective:   Assessment/Plan:  SIRS  On admission patient met guidelines for SIRS w/ HR> 90, RR> 20, WBC> 12 - no infiltrate on CXR - no + culture results - UA not c/w UTI - most c/w SIRS and NOT SEPSIS   Acute COPD exac - acute hypoxic resp failure  No infiltrate on CXR - slowly improving, but volume overload likely contributing to increased O2 requirement at this time - will likely require home O2 - check ambulatory sat after diuresis - Continue with Pulmicort, schedule nebulizer, will do prednisone taper.   Tachyarrhythmia - Sinus Tachy w/ non-conducted PACs v/s MAT > Aflutter > A fib w/ RVR Cardiology following - IV cardizem now transitioned to oral - TSH normal - high risk for anticoag given recent falls - Cards suggests ASA for now and reconsider anticaog in outpt f/u - no amio due to severe lung disease  Continue with digoxin, Bisoprolol. Verapamil   Acute mental status change -Multifactorial to include SIRS, Ativan, steroids, acute on chronic renal failure, sundowning  -CT head unrevealing  -improving.   Dysphagia Cleared for D1 diet w/  thin liquids per SLP   DM2 - acutely severely uncontrolled  A1c 7.1 - required insulin gtt earlier in stay - CBG reasonably controlled - follow w/o change today  Will start low dose lantus.   N/V - resolved  KUB noted gaseous distention of stomach on BIPAP - suspect bloating was mostly air blown in w/ BIPAP - simethicone - resolved   Acute on CKD Stage 3 Baseline creatinine 1.6 - creatinine at presentation 2.58 w/ peak of 3.12 - avoid ACE/ARB - crt now at ~baseline  [facility] Weights   01/02/16 0421 01/02/16 2032 01/03/16 0616  Weight: 106.6 kg (235 lb 0.2 oz) 102.921 kg (226 lb 14.4 oz) 102.014 kg (224 lb 14.4 oz)   Cr at 1.8, monitor on lasix.  Hypokalemia; on oral supplement. Will give extra dose of 40 meq.   Hyponatremia Appears to have been due to hypovolemia and hyperglycemia - resolved   Coronary artery disease status post CABG x3 2010 Cont ASA + lipid med  Aortic stenosis status post valve replacement 2010 Pericardial tissue valve stable on TTE   Carotid artery stenosis  Dopplers May 2015 showed a 60-79% right and 1-39% left stenosis  Hyperlipidemia Cont med tx  GERD  HTN Not an active issue at this time   Code Status: DO NOT RESUSCITATE Family Communication: no family present at time of exam Disposition Plan: awaiting improvement of renal function, tx of HF.   Consultants: [facility]  Cards   Procedures: 3/9 TTE - EF 60-65% - Aortic valve: A bioprosthesis was present and functioning normally - Mitral valve: severe stenosis. Mean gradient (D): 11 mm Hg - Right ventricle: size was normal. Wall thickness normal. Systolic function was normal - Tricuspid valve: trivial regurgitation.  Antibiotics: levaquin 3/7 > 3/13  DVT prophylaxis: Subcutaneous heparin  Objective: Blood pressure 114/61, pulse 94, temperature 98 F (36.7 C), temperature source Oral, resp. rate 20, height 5' 9"  (1.753 m), weight 102.014 kg (224 lb 14.4 oz), SpO2 97 %.  Intake/Output Summary (Last  24 hours) at 01/03/16 0954 Last data filed at 01/03/16 4128  Gross per 24 hour  Intake   1662 ml  Output   2775 ml  Net  -1113 ml   Filed Weights   01/02/16 0421 01/02/16 2032 01/03/16 0616  Weight: 106.6 kg (235 lb 0.2 oz) 102.921 kg (226 lb 14.4 oz) 102.014 kg (224 lb 14.4 oz)   Exam: General: alert and conversant - oriented   Lungs: distant bs th/o - mild basilar crackles  Cardiovascular: reg rate at 90bpm - no signif M - no rub  Abdomen: NT/ND, soft, bs+, no mass, no rebound  Extremities: no significant cyanosis, or clubbing - 2+ edema bilateral lower extremities  Data Reviewed:  Basic Metabolic Panel:  Recent Labs Lab 12/28/15 0300 12/29/15 0243 12/30/15 0503 12/31/15 0236 01/01/16 0240 01/03/16 0843  NA 137 142 143 145 146* 142  K 4.3 4.0 3.8 3.4* 3.5 2.9*  CL 109 115* 113* 115* 116* 105  CO2 17* 18* 21* 22 21* 29  GLUCOSE 229* 144* 158* 154* 143* 198*  BUN 55* 52* 39* 26* 25* 23*  CREATININE 2.22* 2.10* 1.92* 1.72* 1.67* 1.86*  CALCIUM 8.8* 8.8* 8.5* 8.3* 8.4* 8.3*  MG 2.2 2.1 1.8 1.7  --   --     CBC:  Recent Labs Lab 12/28/15 0300 12/29/15 0243 12/30/15 0503 12/31/15 0236  WBC 12.4* 13.2* 13.0* 9.8  NEUTROABS 12.0* 12.6* 12.3* 9.1*  HGB 9.8* 9.5* 9.2* 9.1*  HCT 31.3* 28.4* 28.8* 29.3*  MCV 92.1 91.6 92.9 93.3  PLT 170 193 228 212   Liver Function Tests:  Recent Labs Lab 12/28/15 0300 12/29/15 0243 12/30/15 0503 12/31/15 0236  AST 39 20 17 16   ALT 43 34 28 23  ALKPHOS 53 46 43 39  BILITOT 0.7 0.6 0.7 0.8  PROT 6.0* 5.7* 5.5* 5.3*  ALBUMIN 2.7* 2.8* 2.6* 2.4*   CBG:  Recent Labs Lab 01/02/16 0853 01/02/16 1140 01/02/16 1655 01/02/16 2237 01/03/16 0610  GLUCAP 181* 221* 227* 240* 260*    Recent Results (from the past 240 hour(s))  MRSA PCR Screening     Status: None   Collection Time: 12/25/15 10:28 PM  Result Value Ref Range Status   MRSA by PCR NEGATIVE NEGATIVE Final    Comment:        The GeneXpert MRSA Assay  (FDA approved for NASAL specimens only), is one component of a comprehensive MRSA colonization surveillance program. It is not intended to diagnose MRSA infection nor to guide or monitor treatment for MRSA infections.   Culture, blood (routine x 2)     Status: None   Collection Time: 12/27/15  4:09 PM  Result Value Ref Range Status   Specimen Description BLOOD LEFT ANTECUBITAL  Final   Special Requests IN PEDIATRIC BOTTLE 3CC  Final   Culture NO GROWTH 5 DAYS  Final   Report Status 01/01/2016 FINAL  Final  Culture, blood (  routine x 2)     Status: None   Collection Time: 12/27/15  4:15 PM  Result Value Ref Range Status   Specimen Description BLOOD LEFT HAND  Final   Special Requests IN PEDIATRIC BOTTLE 3CC  Final   Culture NO GROWTH 5 DAYS  Final   Report Status 01/01/2016 FINAL  Final  Culture, Urine     Status: None   Collection Time: 12/27/15  6:32 PM  Result Value Ref Range Status   Specimen Description URINE, RANDOM  Final   Special Requests NONE  Final   Culture NO GROWTH 2 DAYS  Final   Report Status 12/29/2015 FINAL  Final    Studies:   Recent x-ray studies have been reviewed in detail by the Attending Physician  Scheduled Meds:  Scheduled Meds: . antiseptic oral rinse  7 mL Mouth Rinse BID  . aspirin EC  81 mg Oral Daily  . atorvastatin  20 mg Oral q1800  . bisoprolol  2.5 mg Oral Daily  . budesonide (PULMICORT) nebulizer solution  0.25 mg Nebulization BID  . digoxin  0.125 mg Oral Daily  . diltiazem  120 mg Oral 3 times per day  . feeding supplement (GLUCERNA SHAKE)  237 mL Oral TID BM  . furosemide  40 mg Intravenous BID  . heparin subcutaneous  5,000 Units Subcutaneous 3 times per day  . insulin aspart  0-20 Units Subcutaneous TID WC  . insulin aspart  0-5 Units Subcutaneous QHS  . ipratropium  0.5 mg Nebulization TID  . levalbuterol  0.63 mg Nebulization TID  . pantoprazole  40 mg Oral BID  . potassium chloride  20 mEq Oral BID  . sodium chloride  flush  3 mL Intravenous Q12H    Time spent on care of this patient: 35 mins   Elmarie Shiley , MD  [phone] [facility] Office  [phone] Pager - Text Page per Amion as per below:  On-Call/Text Page:      [email or web address]      password [email or web address]  If 7PM-7AM, please contact night-coverage [email or web address] Password [email or web address] 01/03/2016, 9:54 AM   LOS: 9 days

## 2016-01-04 DIAGNOSIS — E118 Type 2 diabetes mellitus with unspecified complications: Secondary | ICD-10-CM

## 2016-01-04 DIAGNOSIS — E1165 Type 2 diabetes mellitus with hyperglycemia: Secondary | ICD-10-CM

## 2016-01-04 DIAGNOSIS — I272 Other secondary pulmonary hypertension: Secondary | ICD-10-CM

## 2016-01-04 LAB — BASIC METABOLIC PANEL
Anion gap: 12 (ref 5–15)
BUN: 25 mg/dL — ABNORMAL HIGH (ref 6–20)
CALCIUM: 8.1 mg/dL — AB (ref 8.9–10.3)
CHLORIDE: 97 mmol/L — AB (ref 101–111)
CO2: 31 mmol/L (ref 22–32)
CREATININE: 2.09 mg/dL — AB (ref 0.61–1.24)
GFR, EST AFRICAN AMERICAN: 31 mL/min — AB (ref >60)
GFR, EST NON AFRICAN AMERICAN: 27 mL/min — AB (ref >60)
Glucose, Bld: 279 mg/dL — ABNORMAL HIGH (ref 65–99)
Potassium: 3.5 mmol/L (ref 3.5–5.1)
SODIUM: 140 mmol/L (ref 135–145)

## 2016-01-04 LAB — GLUCOSE, CAPILLARY
GLUCOSE-CAPILLARY: 268 mg/dL — AB (ref 65–99)
Glucose-Capillary: 247 mg/dL — ABNORMAL HIGH (ref 65–99)
Glucose-Capillary: 330 mg/dL — ABNORMAL HIGH (ref 65–99)

## 2016-01-04 MED ORDER — LEVALBUTEROL HCL 0.63 MG/3ML IN NEBU
0.6300 mg | INHALATION_SOLUTION | Freq: Four times a day (QID) | RESPIRATORY_TRACT | Status: DC | PRN
  Administered 2016-01-06 – 2016-01-07 (×2): 0.63 mg via RESPIRATORY_TRACT
  Filled 2016-01-04 (×2): qty 3

## 2016-01-04 MED ORDER — INSULIN ASPART 100 UNIT/ML ~~LOC~~ SOLN
0.0000 [IU] | Freq: Three times a day (TID) | SUBCUTANEOUS | Status: DC
  Administered 2016-01-05: 7 [IU] via SUBCUTANEOUS
  Administered 2016-01-05: 11 [IU] via SUBCUTANEOUS
  Administered 2016-01-05: 15 [IU] via SUBCUTANEOUS
  Administered 2016-01-06 (×2): 11 [IU] via SUBCUTANEOUS
  Administered 2016-01-07: 15 [IU] via SUBCUTANEOUS
  Administered 2016-01-07 (×2): 7 [IU] via SUBCUTANEOUS
  Administered 2016-01-08: 11 [IU] via SUBCUTANEOUS
  Administered 2016-01-08: 3 [IU] via SUBCUTANEOUS
  Administered 2016-01-08: 11 [IU] via SUBCUTANEOUS
  Administered 2016-01-09: 20 [IU] via SUBCUTANEOUS
  Administered 2016-01-09: 7 [IU] via SUBCUTANEOUS
  Administered 2016-01-09: 4 [IU] via SUBCUTANEOUS
  Administered 2016-01-10: 20 [IU] via SUBCUTANEOUS
  Administered 2016-01-10: 11 [IU] via SUBCUTANEOUS
  Administered 2016-01-10 – 2016-01-11 (×3): 15 [IU] via SUBCUTANEOUS
  Administered 2016-01-11 – 2016-01-12 (×3): 11 [IU] via SUBCUTANEOUS
  Administered 2016-01-12: 7 [IU] via SUBCUTANEOUS
  Administered 2016-01-13: 4 [IU] via SUBCUTANEOUS
  Administered 2016-01-13: 7 [IU] via SUBCUTANEOUS
  Administered 2016-01-13: 4 [IU] via SUBCUTANEOUS
  Administered 2016-01-14: 11 [IU] via SUBCUTANEOUS
  Administered 2016-01-14: 15 [IU] via SUBCUTANEOUS
  Administered 2016-01-14: 4 [IU] via SUBCUTANEOUS
  Administered 2016-01-15: 3 [IU] via SUBCUTANEOUS
  Administered 2016-01-15: 7 [IU] via SUBCUTANEOUS
  Administered 2016-01-16: 4 [IU] via SUBCUTANEOUS
  Administered 2016-01-16: 7 [IU] via SUBCUTANEOUS
  Administered 2016-01-17: 3 [IU] via SUBCUTANEOUS
  Administered 2016-01-17 (×2): 4 [IU] via SUBCUTANEOUS

## 2016-01-04 MED ORDER — BISACODYL 10 MG RE SUPP
10.0000 mg | Freq: Once | RECTAL | Status: AC
  Administered 2016-01-04: 10 mg via RECTAL
  Filled 2016-01-04: qty 1

## 2016-01-04 MED ORDER — PREDNISONE 20 MG PO TABS
40.0000 mg | ORAL_TABLET | Freq: Every day | ORAL | Status: DC
  Administered 2016-01-05: 40 mg via ORAL
  Filled 2016-01-04: qty 2

## 2016-01-04 MED ORDER — IPRATROPIUM BROMIDE 0.02 % IN SOLN
0.5000 mg | Freq: Four times a day (QID) | RESPIRATORY_TRACT | Status: DC | PRN
  Administered 2016-01-06: 0.5 mg via RESPIRATORY_TRACT
  Filled 2016-01-04: qty 2.5

## 2016-01-04 MED ORDER — TORSEMIDE 20 MG PO TABS
40.0000 mg | ORAL_TABLET | Freq: Every day | ORAL | Status: DC
  Administered 2016-01-05: 40 mg via ORAL
  Filled 2016-01-04: qty 2

## 2016-01-04 NOTE — Progress Notes (Signed)
  SATURATION QUALIFICATIONS: (This note is used to comply with regulatory documentation for home oxygen)  Patient Saturations on Room Air at Rest = 85%  Patient Saturations on Room Air while Ambulating = 85%  Patient Saturations on 3 Liters of oxygen while Ambulating = 93%  Please briefly explain why patient needs home oxygen:to maintain appropriate SaO2 levels.   Blondell Reveal Kist[place] PT 01/04/2016  [phone]

## 2016-01-04 NOTE — Progress Notes (Signed)
Inpatient Diabetes Program Recommendations  AACE/ADA: New Consensus Statement on Inpatient Glycemic Control (2015)  Target Ranges:  Prepandial:   less than 140 mg/dL      Peak postprandial:   less than 180 mg/dL (1-2 hours)      Critically ill patients:  140 - 180 mg/dL   Review of Glycemic Control Results for BOWIE, SCELSI (MRN [id number]) as of 01/04/2016 10:39  Ref. Range 01/03/2016 06:10 01/03/2016 11:18 01/03/2016 16:02 01/03/2016 23:30 01/04/2016 06:51  Glucose-Capillary Latest Ref Range: 65-99 mg/dL 260 (H) 146 (H) 246 (H) 243 (H) 268 (H)   Inpatient Diabetes Program Recommendations: Please consider increasing Lantus to 20 units daily.  Thanks, Adah Perl, RN, BC-ADM Inpatient Diabetes Coordinator Pager [phone] (8a-5p)

## 2016-01-04 NOTE — Progress Notes (Signed)
Patient has accepted bed offer at [facility]. Facility has been informed of patient and family's selection. CSW made facility representative Rollene Fare aware that patient is not medically stable and ready for discharge on today. Per Rollene Fare, she will contact family.   CSW will continue to follow and provide support for patient while in hospital.   Lucius Conn, [place] Worker [facility] Ph: [phone]

## 2016-01-04 NOTE — Progress Notes (Signed)
Speech Language Pathology Treatment: Dysphagia  Patient Details Name: 82 MRN: [id number] DOB: Dec 19, 1926 Today's Date: 01/04/2016 Time: 0802-2336 SLP Time Calculation (min) (ACUTE ONLY): 9 min  Assessment / Plan / Recommendation Clinical Impression  Pt continues to tolerate diet well, although intake is minimal.  Demonstrates improved ability to manage dysphagia 2 (chopped) consistencies; voice is stronger and clearer.  Requires min verbal cues for rate/bolus size, and hand-over-hand assist with self-feeding.  Lungs are diminished but clear.  Recommend advancing diet to dysphagia 2; continue thin liquids; continue meds whole in puree.  No further SLP needs identified while in acute care - our services will sign off here; pt will benefit from evaluation for diet advancement after he transitions to SNF.    HPI HPI: 82 y.o. WM PMHx CVA, CAD native artery S/P CABG 2010, S/P bioprosthetic AV replacement 2010, HTN, HLD, DM Type 2, Bilateral hydrocele, C-spine Foraminal Stenosis. c/o a 2 day history of generalized weakness which led to him falling out of bed 2 days prior. He developed hip pain from this fall. He also noted the new onset wheezing and coughing with occasional phlegm production, and a runny nose w/ upper respiratory congestion. He also noted a very poor appetite with very little oral intake. Found to be septic with acute COPD exacerbation. Pt observed to cough at home with PO and after admission. CXR on 3/7 clear      SLP Plan  All goals met     Recommendations  Diet recommendations: Dysphagia 2 (fine chop);Thin liquid Liquids provided via: Cup Medication Administration: Whole meds with puree Supervision: Staff to assist with self feeding Compensations: Slow rate;Small sips/bites Postural Changes and/or Swallow Maneuvers: Seated upright 90 degrees             Oral Care Recommendations: Oral care BID Follow up Recommendations: Skilled Nursing facility Plan: All goals  met     Eric [name] L. Tivis Ringer, [place] CCC/SLP Pager [phone]                 Eric [name] [place] 01/04/2016, 11:03 AM

## 2016-01-04 NOTE — Progress Notes (Signed)
[place] TEAM PROGRESS NOTE  Eric [name]:[id number] DOB: 1927-02-04 DOA: 12/25/2015 PCP: [name], MD  Admit HPI / Brief Narrative: 82 y.o. male who c/o a 2 day history of generalized weakness which lead to him falling out of bed 2 days prior. He developed hip pain from this fall. He also noted the new onset wheezing and coughing with occasional phlegm production, and a runny nose w/ upper respiratory congestion. He also noted a very poor appetite with very little oral intake.   He came to the ER and was found to be tachycardic (had P waves - was irregular - possible MAT).  He was treated with nebs with improvement of resp failure and tightness. His HR continued to climb to 135. He was given Cardizem 20 mg with BP dropping after. He was given a fluid bolus, and a dilt drip at 5 mg was begun.   HPI/Subjective: Sitting in chair, no distress, breathing ok.  Agree with going to rehab.   Assessment/Plan:  SIRS  On admission patient met guidelines for SIRS w/ HR> 90, RR> 20, WBC> 12 - no infiltrate on CXR - no + culture results - UA not c/w UTI - most c/w SIRS and NOT SEPSIS   Acute COPD exac - acute hypoxic resp failure  No infiltrate on CXR - slowly improving, but volume overload likely contributing to increased O2 requirement at this time - will likely require home O2 - check ambulatory sat after diuresis - Continue with Pulmicort, schedule nebulizer, will do prednisone taper.   Tachyarrhythmia - Sinus Tachy w/ non-conducted PACs v/s MAT > Aflutter > A fib w/ RVR Cardiology following - IV cardizem now transitioned to oral - TSH normal - high risk for anticoag given recent falls - Cards suggests ASA for now and reconsider anticaog in outpt f/u - no amio due to severe lung disease  Continue with digoxin, Bisoprolol. Verapamil   Acute mental status change -Multifactorial to include SIRS, Ativan, steroids, acute on chronic renal failure, sundowning    -CT head unrevealing  -improving.   Dysphagia Cleared for D1 diet w/ thin liquids per SLP   DM2 - acutely severely uncontrolled  A1c 7.1 - required insulin gtt earlier in stay - CBG reasonably controlled - follow w/o change today  Will start low dose lantus.   N/V - resolved  KUB noted gaseous distention of stomach on BIPAP - suspect bloating was mostly air blown in w/ BIPAP - simethicone - resolved  Constipation; will give laxative  Acute on CKD Stage 3 Baseline creatinine 1.6 - creatinine at presentation 2.58 w/ peak of 3.12 - avoid ACE/ARB - crt now at ~baseline  [facility] Weights   01/02/16 2032 01/03/16 0616 01/04/16 0557  Weight: 102.921 kg (226 lb 14.4 oz) 102.014 kg (224 lb 14.4 oz) 98.521 kg (217 lb 3.2 oz)   Cr at 2.0, monitor on lasix.   Acute Diastolic HF;  Good urine out put.  Cardiology managing   Hyponatremia Appears to have been due to hypovolemia and hyperglycemia - resolved   Coronary artery disease status post CABG x3 2010 Cont ASA + lipid med  Aortic stenosis status post valve replacement 2010 Pericardial tissue valve stable on TTE   Carotid artery stenosis  Dopplers May 2015 showed a 60-79% right and 1-39% left stenosis  Hyperlipidemia Cont med tx  GERD  HTN Not an active issue at this time   Code Status: DO NOT RESUSCITATE Family Communication: no family  present at time of exam Disposition Plan: awaiting improvement of renal function, tx of HF.   Consultants: [facility] Cards   Procedures: 3/9 TTE - EF 60-65% - Aortic valve: A bioprosthesis was present and functioning normally - Mitral valve: severe stenosis. Mean gradient (D): 11 mm Hg - Right ventricle: size was normal. Wall thickness normal. Systolic function was normal - Tricuspid valve: trivial regurgitation.  Antibiotics: levaquin 3/7 > 3/13  DVT prophylaxis: Subcutaneous heparin  Objective: Blood pressure 118/80, pulse 84, temperature 98.2 F (36.8 C), temperature source Oral,  resp. rate 18, height 5' 9" (1.753 m), weight 98.521 kg (217 lb 3.2 oz), SpO2 95 %.  Intake/Output Summary (Last 24 hours) at 01/04/16 1707 Last data filed at 01/04/16 1300  Gross per 24 hour  Intake    700 ml  Output   4400 ml  Net  -3700 ml   Filed Weights   01/02/16 2032 01/03/16 0616 01/04/16 0557  Weight: 102.921 kg (226 lb 14.4 oz) 102.014 kg (224 lb 14.4 oz) 98.521 kg (217 lb 3.2 oz)   Exam: General: alert and conversant - oriented   Lungs: distant bs th/o - mild basilar crackles  Cardiovascular: reg rate at 90bpm - no signif M - no rub  Abdomen: NT/ND, soft, bs+, no mass, no rebound  Extremities: no significant cyanosis, or clubbing - 2+ edema bilateral lower extremities  Data Reviewed:  Basic Metabolic Panel:  Recent Labs Lab 12/29/15 0243 12/30/15 0503 12/31/15 0236 01/01/16 0240 01/03/16 0843 01/04/16 0521  NA 142 143 145 146* 142 140  K 4.0 3.8 3.4* 3.5 2.9* 3.5  CL 115* 113* 115* 116* 105 97*  CO2 18* 21* 22 21* 29 31  GLUCOSE 144* 158* 154* 143* 198* 279*  BUN 52* 39* 26* 25* 23* 25*  CREATININE 2.10* 1.92* 1.72* 1.67* 1.86* 2.09*  CALCIUM 8.8* 8.5* 8.3* 8.4* 8.3* 8.1*  MG 2.1 1.8 1.7  --   --   --     CBC:  Recent Labs Lab 12/29/15 0243 12/30/15 0503 12/31/15 0236  WBC 13.2* 13.0* 9.8  NEUTROABS 12.6* 12.3* 9.1*  HGB 9.5* 9.2* 9.1*  HCT 28.4* 28.8* 29.3*  MCV 91.6 92.9 93.3  PLT 193 228 212   Liver Function Tests:  Recent Labs Lab 12/29/15 0243 12/30/15 0503 12/31/15 0236  AST _0 ALT 34 28 23  ALKPHOS 46 43 39  BILITOT 0.6 0.7 0.8  PROT 5.7* 5.5* 5.3*  ALBUMIN 2.8* 2.6* 2.4*   CBG:  Recent Labs Lab 01/03/16 1118 01/03/16 1602 01/03/16 2330 01/04/16 0651 01/04/16 1101  GLUCAP 146* 246* 243* 268* 247*    Recent Results (from the past 240 hour(s))  MRSA PCR Screening     Status: None   Collection Time: 12/25/15 10:28 PM  Result Value Ref Range Status   MRSA by PCR NEGATIVE NEGATIVE Final    Comment:        The  GeneXpert MRSA Assay (FDA approved for NASAL specimens only), is one component of a comprehensive MRSA colonization surveillance program. It is not intended to diagnose MRSA infection nor to guide or monitor treatment for MRSA infections.   Culture, blood (routine x 2)     Status: None   Collection Time: 12/27/15  4:09 PM  Result Value Ref Range Status   Specimen Description BLOOD LEFT ANTECUBITAL  Final   Special Requests IN PEDIATRIC BOTTLE 3CC  Final   Culture NO GROWTH 5 DAYS  Final   Report Status 01/01/2016  FINAL  Final  Culture, blood (routine x 2)     Status: None   Collection Time: 12/27/15  4:15 PM  Result Value Ref Range Status   Specimen Description BLOOD LEFT HAND  Final   Special Requests IN PEDIATRIC BOTTLE 3CC  Final   Culture NO GROWTH 5 DAYS  Final   Report Status 01/01/2016 FINAL  Final  Culture, Urine     Status: None   Collection Time: 12/27/15  6:32 PM  Result Value Ref Range Status   Specimen Description URINE, RANDOM  Final   Special Requests NONE  Final   Culture NO GROWTH 2 DAYS  Final   Report Status 12/29/2015 FINAL  Final    Studies:   Recent x-ray studies have been reviewed in detail by the Attending Physician  Scheduled Meds:  Scheduled Meds: . antiseptic oral rinse  7 mL Mouth Rinse BID  . aspirin EC  81 mg Oral Daily  . atorvastatin  20 mg Oral q1800  . bisoprolol  2.5 mg Oral Daily  . budesonide (PULMICORT) nebulizer solution  0.25 mg Nebulization BID  . digoxin  0.0625 mg Oral Daily  . diltiazem  120 mg Oral 3 times per day  . feeding supplement (GLUCERNA SHAKE)  237 mL Oral TID BM  . heparin subcutaneous  5,000 Units Subcutaneous 3 times per day  . insulin aspart  0-20 Units Subcutaneous TID WC  . insulin aspart  0-5 Units Subcutaneous QHS  . insulin glargine  10 Units Subcutaneous Daily  . pantoprazole  40 mg Oral BID  . polyethylene glycol  17 g Oral BID  . potassium chloride  20 mEq Oral BID  . predniSONE  40 mg Oral Q  breakfast  . sodium chloride flush  3 mL Intravenous Q12H  . [START ON 01/05/2016] torsemide  40 mg Oral Daily    Time spent on care of this patient: 35 mins   Elmarie Shiley , MD  [phone]  [facility] Office  [phone] Pager - Text Page per Amion as per below:  On-Call/Text Page:      [email or web address]      password [email or web address]  If 7PM-7AM, please contact night-coverage [email or web address] Password [email or web address] 01/04/2016, 5:07 PM   LOS: 10 days

## 2016-01-04 NOTE — Progress Notes (Addendum)
Physical Therapy Treatment Patient Details Name: Eric [name] MRN: [id number] DOB: 29-Jan-1927 Today's Date: 01/04/2016    History of Present Illness 82 yo presented with 2 day history of generalized weakness which resulted in patient falling out of bed 2 days ago. Developed hip pain from this fall which persisted. History of hip fracture and surgical repair. patient has had new onset wheezing and coughing with occasional phlegm production. pt presents with A-flutter, COPD Exacerbation, and N/V.  pt with hx of AVR, CABG, HTN, DM, Shingles, and L THR.  Pt with A-fib with RVR. COPD/Bronchitis.     PT Comments    Pt gradually progressing with mobility, he ambulated 6' + 10' with RW, min assist and 3L O2. SaO2 85% on RA, 93% on 3L O2 [place] walking. He is deconditioned and would benefit from ST-SNF.   Follow Up Recommendations  SNF     Equipment Recommendations  None recommended by PT    Recommendations for Other Services       Precautions / Restrictions Precautions Precautions: Fall Precaution Comments: watch O2 Sats`    Mobility  Bed Mobility Overal bed mobility: Needs Assistance;+2 for physical assistance Bed Mobility: Rolling;Sidelying to Sit Rolling: Mod assist Sidelying to sit: Mod assist       General bed mobility comments: pt needs A with Bil LEs and bringing trunk up to sitting  Transfers Overall transfer level: Needs assistance Equipment used: Rolling walker (2 wheeled) Transfers: Sit to/from Stand Sit to Stand: +2 physical assistance;Mod assist;From elevated surface         General transfer comment: Cues for hand placement, and weight shift anteriorly as well as to push through feet for sit to stand; mod assist to power up.    Pt initially reported dizziness in standing. BP sitting 93/46, standing 93/46. Dizziness resolved with 2nd attempt of sit to stand.   Ambulation/Gait Ambulation/Gait assistance: Min assist;+2 safety/equipment Ambulation Distance (Feet):  16 Feet (6' + 10") Assistive device: Rolling walker (2 wheeled) Gait Pattern/deviations: Decreased step length - right;Decreased step length - left     General Gait Details: 6' + 10' with RW, +2 safety. Shuffling gait and fatigues quickly; followed with chair, seated rest break between trials of ambulation.  fatigues quickly, SaO2 85% on RA, 93% on 3L O2 [place]   Stairs            Wheelchair Mobility    Modified Rankin (Stroke Patients Only)       Balance     Sitting balance-Leahy Scale: Good       Standing balance-Leahy Scale: Fair                      Cognition Arousal/Alertness: Awake/alert Behavior During Therapy: WFL for tasks assessed/performed Overall Cognitive Status: No family/caregiver present to determine baseline cognitive functioning                      Exercises      General Comments        Pertinent Vitals/Pain Pain Assessment: No/denies pain    Home Living                      Prior Function            PT Goals (current goals can now be found in the care plan section) Acute Rehab PT Goals Patient Stated Goal: to be able to walk more, ST-SNF then go home PT Goal Formulation: With  patient Time For Goal Achievement: 01/09/16 Potential to Achieve Goals: Good Progress towards PT goals: Progressing toward goals    Frequency  Min 2X/week    PT Plan Current plan remains appropriate    Co-evaluation   Reason for Co-Treatment: For patient/therapist safety PT goals addressed during session: Mobility/safety with mobility       End of Session Equipment Utilized During Treatment: Gait belt Activity Tolerance: Patient limited by fatigue Patient left: in chair;with call bell/phone within reach;with nursing/sitter in room;with chair alarm set     Time: 1110-1136 PT Time Calculation (min) (ACUTE ONLY): 26 min  Charges:  $Gait Training: 8-22 mins $Therapeutic Activity: 8-22 mins                    G Codes:       Philomena Doheny 01/04/2016, 12:25 PM [phone]

## 2016-01-04 NOTE — Progress Notes (Addendum)
Patient Name: Eric [name] Date of Encounter: 01/04/2016  Hospital Problem List     Principal Problem:   Acute respiratory distress [facility]) Active Problems:   Bronchitis   Atrial fibrillation with RVR ([name]   Multifocal atrial tachycardia ([name]   Sepsis ([name]   COPD exacerbation ([name]   Metabolic acidosis   Hypotension   Diabetes mellitus with complication ([name]   S/P CABG (coronary artery bypass graft)   S/P AVR (aortic valve replacement)   Pulmonary hypertension ([name]   CAD in native artery   Uncontrolled type 2 diabetes mellitus with complication ([name]   Hyponatremia   Acute renal failure superimposed on stage 3 chronic kidney disease ([place])   Delirium   HYPERCHOLESTEROLEMIA   Dilated cardiomyopathy ([name]   H/O aortic valve replacement   Acute renal failure ([name]    Subjective   Breathing continues to slowly improve.  No c/p.    Inpatient Medications    . antiseptic oral rinse  7 mL Mouth Rinse BID  . aspirin EC  81 mg Oral Daily  . atorvastatin  20 mg Oral q1800  . bisoprolol  2.5 mg Oral Daily  . budesonide (PULMICORT) nebulizer solution  0.25 mg Nebulization BID  . digoxin  0.0625 mg Oral Daily  . diltiazem  120 mg Oral 3 times per day  . feeding supplement (GLUCERNA SHAKE)  237 mL Oral TID BM  . furosemide  40 mg Intravenous BID  . heparin subcutaneous  5,000 Units Subcutaneous 3 times per day  . insulin aspart  0-20 Units Subcutaneous TID WC  . insulin aspart  0-5 Units Subcutaneous QHS  . insulin glargine  10 Units Subcutaneous Daily  . ipratropium  0.5 mg Nebulization TID  . levalbuterol  0.63 mg Nebulization TID  . pantoprazole  40 mg Oral BID  . polyethylene glycol  17 g Oral BID  . potassium chloride  20 mEq Oral BID  . predniSONE  40 mg Oral Q breakfast  . sodium chloride flush  3 mL Intravenous Q12H    Vital Signs    Filed Vitals:   01/03/16 1437 01/03/16 2236 01/03/16 2300 01/04/16 0557  BP:   130/56 114/57  Pulse: 95 93 99 87  Temp:    98 F (36.7 C) 98.4 F (36.9 C)  TempSrc:   Oral Oral  Resp: 18 20 20 20   Height:      Weight:    217 lb 3.2 oz (98.521 kg)  SpO2: 95% 94% 96% 95%    Intake/Output Summary (Last 24 hours) at 01/04/16 0754 Last data filed at 01/04/16 0700  Gross per 24 hour  Intake    600 ml  Output   4150 ml  Net  -3550 ml   Filed Weights   01/02/16 2032 01/03/16 0616 01/04/16 0557  Weight: 226 lb 14.4 oz (102.921 kg) 224 lb 14.4 oz (102.014 kg) 217 lb 3.2 oz (98.521 kg)    Physical Exam    General: Pleasant, NAD. Neuro: Alert and oriented X 3. Moves all extremities spontaneously. Psych: Normal affect. HEENT:  Normal  Neck: Supple without bruits.  JVP remains up. Lungs:  Resp regular and unlabored, exp wheezing throughout w/ bibasilar crackles. Heart: RRR no s3, s4, or murmurs. Abdomen: firm, protuberant, BS + x 4.  Extremities: No clubbing, cyanosis or edema. DP/PT/Radials 1+ and equal bilaterally.  Labs    Basic Metabolic Panel  Recent Labs  01/03/16 0843 01/04/16 0521  NA 142 140  K 2.9* 3.5  CL 105 97*  CO2 29 31  GLUCOSE 198* 279*  BUN 23* 25*  CREATININE 1.86* 2.09*  CALCIUM 8.3* 8.1*    Telemetry    AF 80's to 90's.  2D Echocardiogram 3.16.2017 - limited  - Left ventricle: The cavity size was normal. Wall thickness was  increased in a pattern of mild LVH. Systolic function was normal.  The estimated ejection fraction was in the range of 60% to 65%.  Wall motion was normal; there were no regional wall motion  abnormalities. - Mitral valve: Heavy posterior MAC. Mild mitral stenosis. Trivial  regurgitation. Pressure half-time: 89 ms. Mean gradient (D): 5 mm  Hg. Valve area by pressure half-time: 2.34 cm^2.  Impressions:  - Compared to a prior study on 12/27/15, there is only mild mitral  stenosis. This was reported as severe, however, I suspect a jet  sampling error. There is heavy posterior MAC with decreased  posterior leaflet excursion,  however, the anterior leaflet opens  well. _____________   Assessment & Plan    1. Atrial fibrillation with RVR (previous atrial flutter/multifocal atrial tachycardia)  Rate stable in the 80's on  blocker, dilt.  Weaning digoxin - hope to d/c. Asymptomatic currently. Echo shows normal LV function and normally functioning prosthetic aortic valve; Possible severe MS on initial echo however repeat limited echo yesterday showed mild MS.  Needs anticoagulation long term (CHA2DS2VASc = 5) but would be high risk in setting of recent fall and also confusion, though confusion/delirium appears to have cleared. Continue ASA for now and reassess as outpt. Until he can receive anticoagulation, he is not a candidate for cardioversion.  2. COPD/bronchitis- Still with very diminished/coarse breath sounds and diffuse wheezing. -continue bronchodilators, steroids and antibiotics. Management per primary care. Seems to tolerate bisoprolol without worsened wheezing  3. S/P bioprosthetic AVR - normal function on echo.  4. Acute on chronic kidney disease - BUN/Creat/bicarb up slightly with ongoing IV lasix.  Follow.  See #7.  5. CAD s/p CABG - continue ASA and statin; elevated troponin-likely related to atrial arrhythmias and renal insufficiency. No plans for further ischemia evaluation.  6. Confusion/delirium - marked improvement.  7. Acute Diastolic CHF: Minus [id number] on 3/16 with wt down to 217.  Wt had jumped to 235 on 3/14.  Admission wt was 212 though he was felt to be dehydrated @ that time.  Still with crackles, mild JVP, distended abd, and trace lower ext edema. As above, BUN/Creat/bicarb bumping.  Will switch to po torsemide for tomorrow (already received IV lasix this am).  HR/BP stable.  8.  Hypokalemia:  Supp.  Signed, Murray Hodgkins NP  I have seen and examined the patient along with Murray Hodgkins NP.  I have reviewed the chart, notes and new data.  I agree with NP's  note.  PLAN: Assessment is not straightforward, but I think he is at or close to euvolemia. He has R>>L heart failure due to cor pulmonale. Switch to PO diuretics. Rate control is adequate. Would stop the digoxin if this is still the case this weekend. There is still a chance that he may return to normal rhythm as hi respiratory status improves. I would reevaluate for possible DCCV in a month if still in atrial fibrillation. He is not a great candidate for any procedure that requires sedation and is not a good long term anticoagulation candidate. Conceivably, he may tolerate 1-2 months of oral anticoagulation in an attempt to restore normal rhythm.  Sanda Klein, MD, [facility] [phone] 01/04/2016, 1:45 PM

## 2016-01-04 NOTE — Progress Notes (Signed)
Talked to patient about DCP again, he talked to his daughter last pm and has decided to go to SNF short term, pt request [employer]; Soc Worker made aware; Mindi Slicker [facility] [phone]

## 2016-01-05 LAB — CBC
HEMATOCRIT: 31.6 % — AB (ref 39.0–52.0)
Hemoglobin: 10 g/dL — ABNORMAL LOW (ref 13.0–17.0)
MCH: 29.5 pg (ref 26.0–34.0)
MCHC: 31.6 g/dL (ref 30.0–36.0)
MCV: 93.2 fL (ref 78.0–100.0)
Platelets: 255 10*3/uL (ref 150–400)
RBC: 3.39 MIL/uL — AB (ref 4.22–5.81)
RDW: 14.7 % (ref 11.5–15.5)
WBC: 12.6 10*3/uL — AB (ref 4.0–10.5)

## 2016-01-05 LAB — BASIC METABOLIC PANEL
ANION GAP: 14 (ref 5–15)
BUN: 33 mg/dL — ABNORMAL HIGH (ref 6–20)
CHLORIDE: 96 mmol/L — AB (ref 101–111)
CO2: 29 mmol/L (ref 22–32)
Calcium: 8.3 mg/dL — ABNORMAL LOW (ref 8.9–10.3)
Creatinine, Ser: 2.05 mg/dL — ABNORMAL HIGH (ref 0.61–1.24)
GFR calc non Af Amer: 27 mL/min — ABNORMAL LOW (ref >60)
GFR, EST AFRICAN AMERICAN: 32 mL/min — AB (ref >60)
Glucose, Bld: 319 mg/dL — ABNORMAL HIGH (ref 65–99)
POTASSIUM: 4.1 mmol/L (ref 3.5–5.1)
SODIUM: 139 mmol/L (ref 135–145)

## 2016-01-05 LAB — GLUCOSE, CAPILLARY
GLUCOSE-CAPILLARY: 288 mg/dL — AB (ref 65–99)
GLUCOSE-CAPILLARY: 310 mg/dL — AB (ref 65–99)
GLUCOSE-CAPILLARY: 235 mg/dL — AB (ref 65–99)
GLUCOSE-CAPILLARY: 277 mg/dL — AB (ref 65–99)
GLUCOSE-CAPILLARY: 271 mg/dL — AB (ref 65–99)

## 2016-01-05 MED ORDER — PREDNISONE 20 MG PO TABS
30.0000 mg | ORAL_TABLET | Freq: Every day | ORAL | Status: DC
  Administered 2016-01-06 – 2016-01-09 (×4): 30 mg via ORAL
  Filled 2016-01-05 (×4): qty 1

## 2016-01-05 NOTE — Progress Notes (Signed)
[facility] TEAM PROGRESS NOTE  Eric [name] LTR:[id number] DOB: 10-Apr-1927 DOA: 12/25/2015 PCP: [name], MD  Admit HPI / Brief Narrative: 82 y.o. male who c/o a 2 day history of generalized weakness which lead to him falling out of bed 2 days prior. He developed hip pain from this fall. He also noted the new onset wheezing and coughing with occasional phlegm production, and a runny nose w/ upper respiratory congestion. He also noted a very poor appetite with very little oral intake.   He came to the ER and was found to be tachycardic (had P waves - was irregular - possible MAT).  He was treated with nebs with improvement of resp failure and tightness. His HR continued to climb to 135. He was given Cardizem 20 mg with BP dropping after. He was given a fluid bolus, and a dilt drip at 5 mg was begun.   HPI/Subjective: He is sleepy today, was not able to sleep last night.  Answer some questions.   Assessment/Plan: SIRS  On admission patient met guidelines for SIRS w/ HR> 90, RR> 20, WBC> 12 - no infiltrate on CXR - no + culture results - UA not c/w UTI - most c/w SIRS and NOT SEPSIS   Acute COPD exac - acute hypoxic resp failure  No infiltrate on CXR - slowly improving, but volume overload likely contributing to increased O2 requirement at this time - will likely require home O2 - check ambulatory sat after diuresis - Continue with Pulmicort, schedule nebulizer, will do prednisone taper.   Tachyarrhythmia - Sinus Tachy w/ non-conducted PACs v/s MAT > Aflutter > A fib w/ RVR Cardiology following - IV cardizem now transitioned to oral - TSH normal - high risk for anticoag given recent falls - Cards suggests ASA for now and reconsider anticaog in outpt f/u - no amio due to severe lung disease  Continue with digoxin, Bisoprolol. Verapamil   Acute mental status change -Multifactorial to include SIRS, Ativan, steroids, acute on chronic renal failure, sundowning    -CT head unrevealing  -sleepy today, but was not able to sleep overnight.   Dysphagia Cleared for D1 diet w/ thin liquids per SLP   DM2 - acutely severely uncontrolled  A1c 7.1 - required insulin gtt earlier in stay - CBG reasonably controlled - follow w/o change today  Continue with low dose lantus.   N/V - resolved  KUB noted gaseous distention of stomach on BIPAP - suspect bloating was mostly air blown in w/ BIPAP - simethicone - resolved   Constipation; had 2 BM overnight   Acute on CKD Stage 3 Baseline creatinine 1.6 - creatinine at presentation 2.58 w/ peak of 3.12 - avoid ACE/ARB - crt now at ~baseline  Filed Weights   01/03/16 0616 01/04/16 0557 01/05/16 0606  Weight: 102.014 kg (224 lb 14.4 oz) 98.521 kg (217 lb 3.2 oz) 96.344 kg (212 lb 6.4 oz)   Cr at 2.0, monitor on diuretics.   Acute Diastolic HF;  Good urine out put.  Cardiology managing   Hyponatremia Appears to have been due to hypovolemia and hyperglycemia - resolved   Coronary artery disease status post CABG x3 2010 Cont ASA + lipid med  Aortic stenosis status post valve replacement 2010 Pericardial tissue valve stable on TTE   Carotid artery stenosis  Dopplers May 2015 showed a 60-79% right and 1-39% left stenosis  Hyperlipidemia Cont med tx  GERD  HTN Not an active issue at this  time   Code Status: DO NOT RESUSCITATE Family Communication: no family present at time of exam Disposition Plan: awaiting improvement of renal function, tx of HF.   Consultants: [facility] Cards   Procedures: 3/9 TTE - EF 60-65% - Aortic valve: A bioprosthesis was present and functioning normally - Mitral valve: severe stenosis. Mean gradient (D): 11 mm Hg - Right ventricle: size was normal. Wall thickness normal. Systolic function was normal - Tricuspid valve: trivial regurgitation.  Antibiotics: levaquin 3/7 > 3/13  DVT prophylaxis: Subcutaneous heparin  Objective: Blood pressure 102/50, pulse 74, temperature  98.6 F (37 C), temperature source Oral, resp. rate 18, height _0  (1.753 m), weight 96.344 kg (212 lb 6.4 oz), SpO2 95 %.  Intake/Output Summary (Last 24 hours) at 01/05/16 1325 Last data filed at 01/05/16 1305  Gross per 24 hour  Intake    580 ml  Output   1450 ml  Net   -870 ml   Filed Weights   01/03/16 0616 01/04/16 0557 01/05/16 0606  Weight: 102.014 kg (224 lb 14.4 oz) 98.521 kg (217 lb 3.2 oz) 96.344 kg (212 lb 6.4 oz)   Exam: General: alert and conversant - oriented   Lungs: distant bs th/o - mild basilar crackles  Cardiovascular: reg rate at 90bpm - no signif M - no rub  Abdomen: NT/ND, soft, bs+, no mass, no rebound  Extremities: no significant cyanosis, or clubbing - 2+ edema bilateral lower extremities  Data Reviewed:  Basic Metabolic Panel:  Recent Labs Lab 12/30/15 0503 12/31/15 0236 01/01/16 0240 01/03/16 0843 01/04/16 0521 01/05/16 0352  NA 143 145 146* 142 140 139  K 3.8 3.4* 3.5 2.9* 3.5 4.1  CL 113* 115* 116* 105 97* 96*  CO2 21* 22 21* _1 GLUCOSE 158* 154* 143* 198* 279* 319*  BUN 39* 26* 25* 23* 25* 33*  CREATININE 1.92* 1.72* 1.67* 1.86* 2.09* 2.05*  CALCIUM 8.5* 8.3* 8.4* 8.3* 8.1* 8.3*  MG 1.8 1.7  --   --   --   --     CBC:  Recent Labs Lab 12/30/15 0503 12/31/15 0236 01/05/16 0352  WBC 13.0* 9.8 12.6*  NEUTROABS 12.3* 9.1*  --   HGB 9.2* 9.1* 10.0*  HCT 28.8* 29.3* 31.6*  MCV 92.9 93.3 93.2  PLT 228 212 255   Liver Function Tests:  Recent Labs Lab 12/30/15 0503 12/31/15 0236  AST 17 16  ALT 28 23  ALKPHOS 43 39  BILITOT 0.7 0.8  PROT 5.5* 5.3*  ALBUMIN 2.6* 2.4*   CBG:  Recent Labs Lab 01/04/16 1101 01/04/16 1657 01/04/16 2255 01/05/16 0616 01/05/16 1116  GLUCAP 247* 330* 288* 310* 235*    Recent Results (from the past 240 hour(s))  Culture, blood (routine x 2)     Status: None   Collection Time: 12/27/15  4:09 PM  Result Value Ref Range Status   Specimen Description BLOOD LEFT ANTECUBITAL   Final   Special Requests IN PEDIATRIC BOTTLE 3CC  Final   Culture NO GROWTH 5 DAYS  Final   Report Status 01/01/2016 FINAL  Final  Culture, blood (routine x 2)     Status: None   Collection Time: 12/27/15  4:15 PM  Result Value Ref Range Status   Specimen Description BLOOD LEFT HAND  Final   Special Requests IN PEDIATRIC BOTTLE 3CC  Final   Culture NO GROWTH 5 DAYS  Final   Report Status 01/01/2016 FINAL  Final  Culture, Urine  Status: None   Collection Time: 12/27/15  6:32 PM  Result Value Ref Range Status   Specimen Description URINE, RANDOM  Final   Special Requests NONE  Final   Culture NO GROWTH 2 DAYS  Final   Report Status 12/29/2015 FINAL  Final    Studies:   Recent x-ray studies have been reviewed in detail by the Attending Physician  Scheduled Meds:  Scheduled Meds: . antiseptic oral rinse  7 mL Mouth Rinse BID  . aspirin EC  81 mg Oral Daily  . atorvastatin  20 mg Oral q1800  . bisoprolol  2.5 mg Oral Daily  . budesonide (PULMICORT) nebulizer solution  0.25 mg Nebulization BID  . diltiazem  120 mg Oral 3 times per day  . feeding supplement (GLUCERNA SHAKE)  237 mL Oral TID BM  . heparin subcutaneous  5,000 Units Subcutaneous 3 times per day  . insulin aspart  0-20 Units Subcutaneous TID WC  . insulin aspart  0-5 Units Subcutaneous QHS  . insulin glargine  10 Units Subcutaneous Daily  . pantoprazole  40 mg Oral BID  . polyethylene glycol  17 g Oral BID  . potassium chloride  20 mEq Oral BID  . predniSONE  40 mg Oral Q breakfast  . sodium chloride flush  3 mL Intravenous Q12H  . torsemide  40 mg Oral Daily    Time spent on care of this patient: 35 mins   Elmarie Shiley , MD  [place]  [phone] Pager - Text Page per Amion as per below:  On-Call/Text Page:      [email or web address]      password [email or web address]  If 7PM-7AM, please contact night-coverage [email or web address] Password [email or web address] 01/05/2016, 1:25 PM   LOS: 11 days

## 2016-01-05 NOTE — Progress Notes (Signed)
CSW spoke to Dr. Tyrell Antonio this morning- patient is not stable for d/c today to [facility]. DNR form placed on chart for MD's signature.  Will continue to monitor for possible date of stability.  Lorie Phenix. Delorese Sellin, LCSW [phone](weekend coverage)

## 2016-01-05 NOTE — Progress Notes (Signed)
SUBJECTIVE:  Denies any pain or SOB.  somnolent   PHYSICAL EXAM Filed Vitals:   01/04/16 2040 01/04/16 2253 01/05/16 0606 01/05/16 0935  BP:  120/69 115/63   Pulse:  81 74 70  Temp:  97.9 F (36.6 C) 98 F (36.7 C)   TempSrc:  Oral Oral   Resp:  16 17   Height:      Weight:   212 lb 6.4 oz (96.344 kg)   SpO2: 94% 96% 95%    General:  No distress  Lungs:  Decreased breath sounds Heart:  Irregular Abdomen:  Positive bowel sounds, no rebound no guarding Extremities:  Diffuse edema   LABS:  Results for orders placed or performed during the hospital encounter of 12/25/15 (from the past 24 hour(s))  Glucose, capillary     Status: Abnormal   Collection Time: 01/04/16 11:01 AM  Result Value Ref Range   Glucose-Capillary 247 (H) 65 - 99 mg/dL  Glucose, capillary     Status: Abnormal   Collection Time: 01/04/16  4:57 PM  Result Value Ref Range   Glucose-Capillary 330 (H) 65 - 99 mg/dL  Glucose, capillary     Status: Abnormal   Collection Time: 01/04/16 10:55 PM  Result Value Ref Range   Glucose-Capillary 288 (H) 65 - 99 mg/dL   Comment 1 Notify RN    Comment 2 Document in Chart   Basic metabolic panel     Status: Abnormal   Collection Time: 01/05/16  3:52 AM  Result Value Ref Range   Sodium 139 135 - 145 mmol/L   Potassium 4.1 3.5 - 5.1 mmol/L   Chloride 96 (L) 101 - 111 mmol/L   CO2 29 22 - 32 mmol/L   Glucose, Bld 319 (H) 65 - 99 mg/dL   BUN 33 (H) 6 - 20 mg/dL   Creatinine, Ser 2.05 (H) 0.61 - 1.24 mg/dL   Calcium 8.3 (L) 8.9 - 10.3 mg/dL   GFR calc non Af Amer 27 (L) >60 mL/min   GFR calc Af Amer 32 (L) >60 mL/min   Anion gap 14 5 - 15  CBC     Status: Abnormal   Collection Time: 01/05/16  3:52 AM  Result Value Ref Range   WBC 12.6 (H) 4.0 - 10.5 K/uL   RBC 3.39 (L) 4.22 - 5.81 MIL/uL   Hemoglobin 10.0 (L) 13.0 - 17.0 g/dL   HCT 31.6 (L) 39.0 - 52.0 %   MCV 93.2 78.0 - 100.0 fL   MCH 29.5 26.0 - 34.0 pg   MCHC 31.6 30.0 - 36.0 g/dL   RDW 14.7 11.5 -  15.5 %   Platelets 255 150 - 400 K/uL  Glucose, capillary     Status: Abnormal   Collection Time: 01/05/16  6:16 AM  Result Value Ref Range   Glucose-Capillary 310 (H) 65 - 99 mg/dL   Comment 1 Notify RN    Comment 2 Document in Chart     Intake/Output Summary (Last 24 hours) at 01/05/16 1019 Last data filed at 01/05/16 0900  Gross per 24 hour  Intake    700 ml  Output    650 ml  Net     50 ml     ASSESSMENT AND PLAN:  ATRIAL FIB WITH RVR:  Telemetry reviewed.  Rate OK.  Will stop digoxin.    CAD:  Medical management.   No plans for further ischemia work up.    AVR:   Normal function on echo.  Jeneen Rinks [facility] 01/05/2016 10:19 AM

## 2016-01-06 ENCOUNTER — Inpatient Hospital Stay (HOSPITAL_COMMUNITY): Payer: Medicare Other

## 2016-01-06 DIAGNOSIS — I63413 Cerebral infarction due to embolism of bilateral middle cerebral arteries: Secondary | ICD-10-CM

## 2016-01-06 LAB — GLUCOSE, CAPILLARY
GLUCOSE-CAPILLARY: 352 mg/dL — AB (ref 65–99)
Glucose-Capillary: 282 mg/dL — ABNORMAL HIGH (ref 65–99)
Glucose-Capillary: 298 mg/dL — ABNORMAL HIGH (ref 65–99)

## 2016-01-06 LAB — BASIC METABOLIC PANEL
ANION GAP: 12 (ref 5–15)
BUN: 39 mg/dL — AB (ref 6–20)
CALCIUM: 8.1 mg/dL — AB (ref 8.9–10.3)
CO2: 29 mmol/L (ref 22–32)
Chloride: 95 mmol/L — ABNORMAL LOW (ref 101–111)
Creatinine, Ser: 2.33 mg/dL — ABNORMAL HIGH (ref 0.61–1.24)
GFR calc Af Amer: 27 mL/min — ABNORMAL LOW (ref >60)
GFR, EST NON AFRICAN AMERICAN: 24 mL/min — AB (ref >60)
GLUCOSE: 324 mg/dL — AB (ref 65–99)
Potassium: 4.1 mmol/L (ref 3.5–5.1)
SODIUM: 136 mmol/L (ref 135–145)

## 2016-01-06 LAB — HEPATIC FUNCTION PANEL
ALK PHOS: 48 U/L (ref 38–126)
ALT: 19 U/L (ref 17–63)
AST: 22 U/L (ref 15–41)
Albumin: 2.4 g/dL — ABNORMAL LOW (ref 3.5–5.0)
BILIRUBIN DIRECT: 0.4 mg/dL (ref 0.1–0.5)
BILIRUBIN INDIRECT: 0.8 mg/dL (ref 0.3–0.9)
BILIRUBIN TOTAL: 1.2 mg/dL (ref 0.3–1.2)
Total Protein: 5.6 g/dL — ABNORMAL LOW (ref 6.5–8.1)

## 2016-01-06 LAB — AMMONIA: AMMONIA: 18 umol/L (ref 9–35)

## 2016-01-06 MED ORDER — ASPIRIN 81 MG PO CHEW
324.0000 mg | CHEWABLE_TABLET | Freq: Every day | ORAL | Status: DC
  Administered 2016-01-07 – 2016-01-17 (×11): 324 mg via ORAL
  Filled 2016-01-06 (×12): qty 4

## 2016-01-06 MED ORDER — VANCOMYCIN HCL 10 G IV SOLR
1750.0000 mg | Freq: Once | INTRAVENOUS | Status: AC
  Administered 2016-01-06: 1750 mg via INTRAVENOUS
  Filled 2016-01-06: qty 1750

## 2016-01-06 MED ORDER — PANTOPRAZOLE SODIUM 40 MG PO PACK
40.0000 mg | PACK | Freq: Two times a day (BID) | ORAL | Status: DC
  Administered 2016-01-07 – 2016-01-10 (×9): 40 mg via ORAL
  Filled 2016-01-06 (×10): qty 20

## 2016-01-06 MED ORDER — PIPERACILLIN-TAZOBACTAM 3.375 G IVPB
3.3750 g | Freq: Three times a day (TID) | INTRAVENOUS | Status: DC
  Administered 2016-01-06 – 2016-01-07 (×4): 3.375 g via INTRAVENOUS
  Filled 2016-01-06 (×5): qty 50

## 2016-01-06 MED ORDER — VANCOMYCIN HCL IN DEXTROSE 1-5 GM/200ML-% IV SOLN
1000.0000 mg | INTRAVENOUS | Status: DC
  Filled 2016-01-06: qty 200

## 2016-01-06 MED ORDER — INSULIN GLARGINE 100 UNIT/ML ~~LOC~~ SOLN
15.0000 [IU] | Freq: Every day | SUBCUTANEOUS | Status: DC
  Filled 2016-01-06: qty 0.15

## 2016-01-06 MED ORDER — ASPIRIN EC 325 MG PO TBEC
325.0000 mg | DELAYED_RELEASE_TABLET | Freq: Every day | ORAL | Status: DC
  Filled 2016-01-06: qty 1

## 2016-01-06 MED ORDER — ALBUTEROL SULFATE (2.5 MG/3ML) 0.083% IN NEBU
2.5000 mg | INHALATION_SOLUTION | Freq: Once | RESPIRATORY_TRACT | Status: AC
  Administered 2016-01-06: 2.5 mg via RESPIRATORY_TRACT
  Filled 2016-01-06: qty 3

## 2016-01-06 MED ORDER — TORSEMIDE 20 MG PO TABS: 20.0000 mg | ORAL_TABLET | Freq: Every day | ORAL | Status: DC

## 2016-01-06 NOTE — Progress Notes (Signed)
    SUBJECTIVE:   Breathing better. Digoxin held yesterday. HR stable.  PHYSICAL EXAM Filed Vitals:   01/05/16 1157 01/05/16 2002 01/05/16 2009 01/06/16 0421  BP: 102/50  102/50 137/89  Pulse: 74  88 78  Temp: 98.6 F (37 C)  98.4 F (36.9 C) 98.7 F (37.1 C)  TempSrc: Oral  Oral Oral  Resp: 18  16 18   Height:      Weight:    206 lb 6.4 oz (93.622 kg)  SpO2: 95% 95% 94% 94%   General:  No distress  Lungs:  Decreased breath sounds, no rales Heart:  Irregularly irregular Abdomen:  Positive bowel sounds, no rebound no guarding Extremities:  Diffuse edema 2+  LABS:  Results for orders placed or performed during the hospital encounter of 12/25/15 (from the past 24 hour(s))  Glucose, capillary     Status: Abnormal   Collection Time: 01/05/16 11:16 AM  Result Value Ref Range   Glucose-Capillary 235 (H) 65 - 99 mg/dL  Glucose, capillary     Status: Abnormal   Collection Time: 01/05/16  3:41 PM  Result Value Ref Range   Glucose-Capillary 277 (H) 65 - 99 mg/dL  Glucose, capillary     Status: Abnormal   Collection Time: 01/05/16  9:12 PM  Result Value Ref Range   Glucose-Capillary 271 (H) 65 - 99 mg/dL  Basic metabolic panel     Status: Abnormal   Collection Time: 01/06/16  2:20 AM  Result Value Ref Range   Sodium 136 135 - 145 mmol/L   Potassium 4.1 3.5 - 5.1 mmol/L   Chloride 95 (L) 101 - 111 mmol/L   CO2 29 22 - 32 mmol/L   Glucose, Bld 324 (H) 65 - 99 mg/dL   BUN 39 (H) 6 - 20 mg/dL   Creatinine, Ser 2.33 (H) 0.61 - 1.24 mg/dL   Calcium 8.1 (L) 8.9 - 10.3 mg/dL   GFR calc non Af Amer 24 (L) >60 mL/min   GFR calc Af Amer 27 (L) >60 mL/min   Anion gap 12 5 - 15  Glucose, capillary     Status: Abnormal   Collection Time: 01/06/16  6:26 AM  Result Value Ref Range   Glucose-Capillary 282 (H) 65 - 99 mg/dL    Intake/Output Summary (Last 24 hours) at 01/06/16 0744 Last data filed at 01/06/16 0600  Gross per 24 hour  Intake    480 ml  Output   2575 ml  Net  -2095 ml      ASSESSMENT AND PLAN:  ATRIAL FIB WITH RVR:  Telemetry reviewed.  Rate-controlled on cardizem and BB.  CAD:  Medical management. On ASA, lipitor, BB, cardizem.  No plans for further ischemia work up.    AVR:   Normal function on echo.   Volume Overload: combination AKI and treatment for SIRS/Sepsis, up +4L - will need additional diuresis. Currently on torsemide 40 mg daily. Creatinine up to 2.33 today. Holding diuresis per primary service. Net negative -2L yesterday. Consider restart of diuresis tomorrow at 20 mg daily.  Nadean Corwin [facility] 01/06/2016 7:44 AM

## 2016-01-06 NOTE — Consult Note (Addendum)
Neurology Consultation Reason for Consult: AMS and stroke Referring Physician: attending  CC: as abpve, slurerd speech per daughter  History is obtained from chart, patient's daughter and nurse  HPI: Eric [name] is a 82 y.o. male with multiple medical problems who was admitted to the hospital on 3/7 with shortness of breath.  He has a hx of aortic stenosis s/p bioprosthetic valve replacement and is on asa 81.  Apparently after being here for over 10 days he started to have somewhat slurred speech and confusion since yesterday, which triggered the team to order a brain MRI that shows a few right sided embolic appearing strokes.  Neurology was consulted earlier today for this reason and when I arrived at 7pm I was asked to see pt in consultation.  Pt is somewhat confused and unable to provide further hx.   ROS: Unable to obtain due to altered mental status.   Past Medical History  Diagnosis Date  . Cerebrovascular disease, unspecified   . Coronary atherosclerosis of unspecified type of vessel, native or graft     s/p CABG 2010  . Esophageal reflux   . Pure hypercholesterolemia   . Essential hypertension, benign   . Type II or unspecified type diabetes mellitus with neurological manifestations, not stated as uncontrolled   . Bilateral hydrocele 3/11    [facility]  . History of aortic stenosis     s/p valve replacment 2010  . Cervicalgia   . Foraminal stenosis of cervical region     bilateral-C4-C5, C5-C6  . Colon polyp   . Shingles 07/27/2012    Family History  Problem Relation Age of Onset  . Heart attack Father 90  . Breast cancer Mother 58  . Brain cancer Sister   . Prostate cancer Brother   . Diabetes Neg Hx   . Stroke Neg Hx     Social History:  reports that he quit smoking about 22 years ago. His smoking use included Cigarettes. He has a 50 pack-year smoking history. He has never used smokeless tobacco. He reports that he drinks alcohol. He reports that he does not  use illicit drugs.  Exam: Current vital signs: BP 93/41 mmHg  Pulse 100  Temp(Src) 100.5 F (38.1 C) (Oral)  Resp 18  Ht 5\' 9"  (1.753 m)  Wt 93.622 kg (206 lb 6.4 oz)  BMI 30.47 kg/m2  SpO2 92% Vital signs in last 24 hours: Temp:  [98.7 F (37.1 C)-100.5 F (38.1 C)] 100.5 F (38.1 C) (03/19 1303) Pulse Rate:  [78-100] 100 (03/19 1303) Resp:  [18] 18 (03/19 1303) BP: (93-137)/(41-89) 93/41 mmHg (03/19 1303) SpO2:  [92 %-96 %] 92 % (03/19 1934) Weight:  [93.622 kg (206 lb 6.4 oz)] 93.622 kg (206 lb 6.4 oz) (03/19 0421)   Physical Exam  Constitutional: Appears well-developed and well-nourished.  Psych: Affect appropriate to situation Eyes: No scleral injection HENT: No OP obstrucion Head: Normocephalic.  Cardiovascular: Normal rate and regular rhythm.  Respiratory: Effort normal and breath sounds normal to anterior ascultation GI: Soft.  No distension. There is no tenderness.  Skin: WDI  Neuro: Mental Status: Patient is awake, alert, oriented to person, place, but not time and he perseverates - he knows year only - dysarhtic Patient is not able to give a clear and coherent history. No signs of aphasia or neglect Cranial Nerves: II: Visual Fields are full. Pupils are equal, round, and reactive to light.  III,IV, VI: EOMI without ptosis or diploplia.  V: Facial sensation is  symmetric to temperature VII: Facial movement is symmetric with perhaps minimal asymetry on left VIII: hearing is intact to voice Motor: Tone is normal. Bulk is normal. He moves all extremties symmetrically - appears generally weak however Sensory: Sensation is symmetric to light touchin the arms and legs. Deep Tendon Reflexes: areflexic Plantars: Toes are downgoing bilaterally. 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  Recent transthoracic echo was negative.  Increase asa to 325 for now but team should consider anticoagulation, which can be started at any time from my perspective. Will order  Carotid doppler. - also has a toxic metabolic encephalopathy in setting on multiple active medical problems and recent fever.  Has evidence of dysarthria. eneds PT/OT - Lipid panel.  [facility] team will take over after tomorrow am.  Recommendations: 1) as above

## 2016-01-06 NOTE — Progress Notes (Signed)
ANTIBIOTIC CONSULT NOTE - INITIAL  Pharmacy Consult for Vancomycin/Zosyn Indication: pneumonia  No Known Allergies  Patient Measurements: Height: 5\' 9"  (175.3 cm) Weight: 206 lb 6.4 oz (93.622 kg) (bedscale) IBW/kg (Calculated) : 70.7   Vital Signs: Temp: 100.5 F (38.1 C) (03/19 1303) Temp Source: Oral (03/19 1303) BP: 93/41 mmHg (03/19 1303) Pulse Rate: 100 (03/19 1303) Intake/Output from previous day: 03/18 0701 - 03/19 0700 In: 480 [P.O.:480] Out: 2575 [Urine:2575] Intake/Output from this shift: Total I/O In: 120 [P.O.:120] Out: 700 [Urine:700]  Labs:  Recent Labs  01/04/16 0521 01/05/16 0352 01/06/16 0220  WBC  --  12.6*  --   HGB  --  10.0*  --   PLT  --  255  --   CREATININE 2.09* 2.05* 2.33*   Estimated Creatinine Clearance: 25.2 mL/min (by C-G formula based on Cr of 2.33). No results for input(s): VANCOTROUGH, VANCOPEAK, VANCORANDOM, GENTTROUGH, GENTPEAK, GENTRANDOM, TOBRATROUGH, TOBRAPEAK, TOBRARND, AMIKACINPEAK, AMIKACINTROU, AMIKACIN in the last 72 hours.   Microbiology: Recent Results (from the past 720 hour(s))  MRSA PCR Screening     Status: None   Collection Time: 12/25/15 10:28 PM  Result Value Ref Range Status   MRSA by PCR NEGATIVE NEGATIVE Final    Comment:        The GeneXpert MRSA Assay (FDA approved for NASAL specimens only), is one component of a comprehensive MRSA colonization surveillance program. It is not intended to diagnose MRSA infection nor to guide or monitor treatment for MRSA infections.   Culture, blood (routine x 2)     Status: None   Collection Time: 12/27/15  4:09 PM  Result Value Ref Range Status   Specimen Description BLOOD LEFT ANTECUBITAL  Final   Special Requests IN PEDIATRIC BOTTLE 3CC  Final   Culture NO GROWTH 5 DAYS  Final   Report Status 01/01/2016 FINAL  Final  Culture, blood (routine x 2)     Status: None   Collection Time: 12/27/15  4:15 PM  Result Value Ref Range Status   Specimen Description  BLOOD LEFT HAND  Final   Special Requests IN PEDIATRIC BOTTLE 3CC  Final   Culture NO GROWTH 5 DAYS  Final   Report Status 01/01/2016 FINAL  Final  Culture, Urine     Status: None   Collection Time: 12/27/15  6:32 PM  Result Value Ref Range Status   Specimen Description URINE, RANDOM  Final   Special Requests NONE  Final   Culture NO GROWTH 2 DAYS  Final   Report Status 12/29/2015 FINAL  Final    Medical History: Past Medical History  Diagnosis Date  . Cerebrovascular disease, unspecified   . Coronary atherosclerosis of unspecified type of vessel, native or graft     s/p CABG 2010  . Esophageal reflux   . Pure hypercholesterolemia   . Essential hypertension, benign   . Type II or unspecified type diabetes mellitus with neurological manifestations, not stated as uncontrolled   . Bilateral hydrocele 3/11    [facility]  . History of aortic stenosis     s/p valve replacment 2010  . Cervicalgia   . Foraminal stenosis of cervical region     bilateral-C4-C5, C5-C6  . Colon polyp   . Shingles 07/27/2012    Medications:  Scheduled:  . antiseptic oral rinse  7 mL Mouth Rinse BID  . aspirin EC  81 mg Oral Daily  . atorvastatin  20 mg Oral q1800  . bisoprolol  2.5 mg Oral Daily  .  budesonide (PULMICORT) nebulizer solution  0.25 mg Nebulization BID  . diltiazem  120 mg Oral 3 times per day  . feeding supplement (GLUCERNA SHAKE)  237 mL Oral TID BM  . heparin subcutaneous  5,000 Units Subcutaneous 3 times per day  . insulin aspart  0-20 Units Subcutaneous TID WC  . insulin aspart  0-5 Units Subcutaneous QHS  . [START ON 01/07/2016] insulin glargine  15 Units Subcutaneous Daily  . pantoprazole  40 mg Oral BID  . polyethylene glycol  17 g Oral BID  . predniSONE  30 mg Oral Q breakfast  . sodium chloride flush  3 mL Intravenous Q12H   Infusions:   PRN: acetaminophen **OR** acetaminophen, hydrALAZINE, ipratropium, levalbuterol, ondansetron **OR** ondansetron (ZOFRAN) IV, RESOURCE  THICKENUP CLEAR, simethicone   Assessment: 47 YOM admitted for a 2 day hx of generalized weakness.  Pt was started on Levaquin for COPD exacerbation.  Pharmacy is now consulted to dose Vanc/Zosyn for pneumonia after patient had a fever spike today.   WBC 12.6, SCr 2.33, CrCl ~25, Tmax/24h 100.5, UOP 1.1 ml/kg/hr  Levofloxacin 3/9 >> 3/13 Vanc 3/19>> Zosyn 3/19>>   3/7 MRSA PCR negative 3/9 BC NGTD 3/9 BC NGTD 3/7 Flu negative  Goal of Therapy:  Vancomycin trough level 15-20 mcg/ml  Plan:  Zosyn 3.375g q8H (4 hr infusion) Vancomycin 1750 mg Loading Dose then Vanc 1000 mg q24H  Measure antibiotic drug levels at steady state Follow up culture results, LOT Monitor for changes in renal function, s/sx worsening infection  Bennye Alm, PharmD Pharmacy Resident [phone]

## 2016-01-06 NOTE — Progress Notes (Signed)
[place] TEAM PROGRESS NOTE  Eric [name] YWV:[id number] DOB: 06-Oct-1927 DOA: 12/25/2015 PCP: [name], MD  Admit HPI / Brief Narrative: 82 y.o. male who c/o a 2 day history of generalized weakness which lead to him falling out of bed 2 days prior. He developed hip pain from this fall. He also noted the new onset wheezing and coughing with occasional phlegm production, and a runny nose w/ upper respiratory congestion. He also noted a very poor appetite with very little oral intake.   He came to the ER and was found to be tachycardic (had P waves - was irregular - possible MAT).  He was treated with nebs with improvement of resp failure and tightness. His HR continued to climb to 135. He was given Cardizem 20 mg with BP dropping after. He was given a fluid bolus, and a dilt drip at 5 mg was begun.   HPI/Subjective: He is more confuse today. Per daughter speech is slurred since few days back. ,   Assessment/Plan: SIRS  On admission patient met guidelines for SIRS w/ HR> 90, RR> 20, WBC> 12 - no infiltrate on CXR - no + culture results - UA not c/w UTI - most c/w SIRS and NOT SEPSIS  Spike fever today, will check Chest X ray, UA, blood culture.  Start empirically vancomycin and Zosyn.  LFT   Acute COPD exac - acute hypoxic resp failure  No infiltrate on CXR - slowly improving, but volume overload likely contributing to increased O2 requirement at this time - will likely require home O2 - check ambulatory sat after diuresis - Continue with Pulmicort, schedule nebulizer,  prednisone taper.   Tachyarrhythmia - Sinus Tachy w/ non-conducted PACs v/s MAT > Aflutter > A fib w/ RVR Cardiology following - IV cardizem now transitioned to oral - TSH normal - high risk for anticoag given recent falls - Cards suggests ASA for now and reconsider anticaog in outpt f/u - no amio due to severe lung disease  Continue with digoxin, Bisoprolol. Verapamil   Acute mental  status change -Multifactorial to include SIRS, Ativan, steroids, acute on chronic renal failure, sundowning  -CT head unrevealing  -more confuse today, speech less clear. Has fever. Will do work up for fever and check MRI.   Dysphagia Cleared for D1 diet w/ thin liquids per SLP   DM2 - acutely severely uncontrolled  A1c 7.1 - required insulin gtt earlier in stay - CBG reasonably controlled - follow w/o change today  Will increase  lantus to 15 units. .   N/V - resolved  KUB noted gaseous distention of stomach on BIPAP - suspect bloating was mostly air blown in w/ BIPAP - simethicone - resolved   Constipation; had 2 BM overnight   Acute on CKD Stage 3 Baseline creatinine 1.6 - creatinine at presentation 2.58 w/ peak of 3.12 - avoid ACE/ARB - crt now at ~baseline  [facility] Weights   01/04/16 0557 01/05/16 0606 01/06/16 0421  Weight: 98.521 kg (217 lb 3.2 oz) 96.344 kg (212 lb 6.4 oz) 93.622 kg (206 lb 6.4 oz)   Cr at 2.3. Hold diuretics.   Acute Diastolic HF;  Good urine out put.  Cardiology managing  Holding torsemide due to increase cr.   Hyponatremia Appears to have been due to hypovolemia and hyperglycemia - resolved   Coronary artery disease status post CABG x3 2010 Cont ASA + lipid med  Aortic stenosis status post valve replacement 2010 Pericardial tissue valve  stable on TTE   Carotid artery stenosis  Dopplers May 2015 showed a 60-79% right and 1-39% left stenosis  Hyperlipidemia Cont med tx  GERD  HTN Not an active issue at this time   Code Status: DO NOT RESUSCITATE Family Communication: no family present at time of exam Disposition Plan: awaiting improvement of renal function, tx of HF.   Consultants: [facility] Cards   Procedures: 3/9 TTE - EF 60-65% - Aortic valve: A bioprosthesis was present and functioning normally - Mitral valve: severe stenosis. Mean gradient (D): 11 mm Hg - Right ventricle: size was normal. Wall thickness normal. Systolic function was  normal - Tricuspid valve: trivial regurgitation.  Antibiotics: levaquin 3/7 > 3/13  DVT prophylaxis: Subcutaneous heparin  Objective: Blood pressure 93/41, pulse 100, temperature 100.5 F (38.1 C), temperature source Oral, resp. rate 18, height 5' 9"  (1.753 m), weight 93.622 kg (206 lb 6.4 oz), SpO2 96 %.  Intake/Output Summary (Last 24 hours) at 01/06/16 1506 Last data filed at 01/06/16 1300  Gross per 24 hour  Intake    360 ml  Output   2475 ml  Net  -2115 ml   Filed Weights   01/04/16 0557 01/05/16 0606 01/06/16 0421  Weight: 98.521 kg (217 lb 3.2 oz) 96.344 kg (212 lb 6.4 oz) 93.622 kg (206 lb 6.4 oz)   Exam: General: alert and conversant - oriented   Lungs: distant bs th/o - mild basilar crackles  Cardiovascular: reg rate at 90bpm - no signif M - no rub  Abdomen: NT/ND, soft, bs+, no mass, no rebound  Extremities: no significant cyanosis, or clubbing - 2+ edema bilateral lower extremities  Data Reviewed:  Basic Metabolic Panel:  Recent Labs Lab 12/31/15 0236 01/01/16 0240 01/03/16 0843 01/04/16 0521 01/05/16 0352 01/06/16 0220  NA 145 146* 142 140 139 136  K 3.4* 3.5 2.9* 3.5 4.1 4.1  CL 115* 116* 105 97* 96* 95*  CO2 22 21* 29 31 29 29   GLUCOSE 154* 143* 198* 279* 319* 324*  BUN 26* 25* 23* 25* 33* 39*  CREATININE 1.72* 1.67* 1.86* 2.09* 2.05* 2.33*  CALCIUM 8.3* 8.4* 8.3* 8.1* 8.3* 8.1*  MG 1.7  --   --   --   --   --     CBC:  Recent Labs Lab 12/31/15 0236 01/05/16 0352  WBC 9.8 12.6*  NEUTROABS 9.1*  --   HGB 9.1* 10.0*  HCT 29.3* 31.6*  MCV 93.3 93.2  PLT 212 255   Liver Function Tests:  Recent Labs Lab 12/31/15 0236  AST 16  ALT 23  ALKPHOS 39  BILITOT 0.8  PROT 5.3*  ALBUMIN 2.4*   CBG:  Recent Labs Lab 01/05/16 1116 01/05/16 1541 01/05/16 2112 01/06/16 0626 01/06/16 1128  GLUCAP 235* 277* 271* 282* 298*    Recent Results (from the past 240 hour(s))  Culture, blood (routine x 2)     Status: None   Collection Time:  12/27/15  4:09 PM  Result Value Ref Range Status   Specimen Description BLOOD LEFT ANTECUBITAL  Final   Special Requests IN PEDIATRIC BOTTLE 3CC  Final   Culture NO GROWTH 5 DAYS  Final   Report Status 01/01/2016 FINAL  Final  Culture, blood (routine x 2)     Status: None   Collection Time: 12/27/15  4:15 PM  Result Value Ref Range Status   Specimen Description BLOOD LEFT HAND  Final   Special Requests IN PEDIATRIC BOTTLE 3CC  Final   Culture  NO GROWTH 5 DAYS  Final   Report Status 01/01/2016 FINAL  Final  Culture, Urine     Status: None   Collection Time: 12/27/15  6:32 PM  Result Value Ref Range Status   Specimen Description URINE, RANDOM  Final   Special Requests NONE  Final   Culture NO GROWTH 2 DAYS  Final   Report Status 12/29/2015 FINAL  Final    Studies:   Recent x-ray studies have been reviewed in detail by the Attending Physician  Scheduled Meds:  Scheduled Meds: . albuterol  2.5 mg Nebulization Once  . antiseptic oral rinse  7 mL Mouth Rinse BID  . aspirin EC  81 mg Oral Daily  . atorvastatin  20 mg Oral q1800  . bisoprolol  2.5 mg Oral Daily  . budesonide (PULMICORT) nebulizer solution  0.25 mg Nebulization BID  . diltiazem  120 mg Oral 3 times per day  . feeding supplement (GLUCERNA SHAKE)  237 mL Oral TID BM  . heparin subcutaneous  5,000 Units Subcutaneous 3 times per day  . insulin aspart  0-20 Units Subcutaneous TID WC  . insulin aspart  0-5 Units Subcutaneous QHS  . insulin glargine  10 Units Subcutaneous Daily  . pantoprazole  40 mg Oral BID  . polyethylene glycol  17 g Oral BID  . predniSONE  30 mg Oral Q breakfast  . sodium chloride flush  3 mL Intravenous Q12H    Time spent on care of this patient: 35 mins   Elmarie Shiley , MD  [place]  [phone] Pager - Text Page per Amion as per below:  On-Call/Text Page:      [email or web address]      password [email or web address]  If 7PM-7AM, please contact  night-coverage [email or web address] Password [email or web address] 01/06/2016, 3:06 PM   LOS: 12 days

## 2016-01-07 ENCOUNTER — Inpatient Hospital Stay (HOSPITAL_COMMUNITY): Payer: Medicare Other

## 2016-01-07 DIAGNOSIS — L8990 Pressure ulcer of unspecified site, unspecified stage: Secondary | ICD-10-CM | POA: Insufficient documentation

## 2016-01-07 DIAGNOSIS — I639 Cerebral infarction, unspecified: Secondary | ICD-10-CM

## 2016-01-07 LAB — LIPID PANEL
Cholesterol: 107 mg/dL (ref 0–200)
HDL: 33 mg/dL — AB (ref >40)
LDL CALC: 52 mg/dL (ref 0–99)
Total CHOL/HDL Ratio: 3.2 RATIO
Triglycerides: 112 mg/dL (ref <150)
VLDL: 22 mg/dL (ref 0–40)

## 2016-01-07 LAB — URINALYSIS, ROUTINE W REFLEX MICROSCOPIC
GLUCOSE, UA: 250 mg/dL — AB
KETONES UR: 15 mg/dL — AB
LEUKOCYTES UA: NEGATIVE
NITRITE: NEGATIVE
PH: 6 (ref 5.0–8.0)
PROTEIN: 30 mg/dL — AB
Specific Gravity, Urine: 1.019 (ref 1.005–1.030)

## 2016-01-07 LAB — BASIC METABOLIC PANEL
Anion gap: 12 (ref 5–15)
BUN: 47 mg/dL — AB (ref 6–20)
CHLORIDE: 98 mmol/L — AB (ref 101–111)
CO2: 29 mmol/L (ref 22–32)
Calcium: 7.9 mg/dL — ABNORMAL LOW (ref 8.9–10.3)
Creatinine, Ser: 2.84 mg/dL — ABNORMAL HIGH (ref 0.61–1.24)
GFR calc Af Amer: 21 mL/min — ABNORMAL LOW (ref >60)
GFR calc non Af Amer: 19 mL/min — ABNORMAL LOW (ref >60)
GLUCOSE: 339 mg/dL — AB (ref 65–99)
POTASSIUM: 3.8 mmol/L (ref 3.5–5.1)
SODIUM: 139 mmol/L (ref 135–145)

## 2016-01-07 LAB — URINE MICROSCOPIC-ADD ON: RBC / HPF: NONE SEEN RBC/hpf (ref 0–5)

## 2016-01-07 LAB — GLUCOSE, CAPILLARY
GLUCOSE-CAPILLARY: 232 mg/dL — AB (ref 65–99)
GLUCOSE-CAPILLARY: 271 mg/dL — AB (ref 65–99)
Glucose-Capillary: 306 mg/dL — ABNORMAL HIGH (ref 65–99)
Glucose-Capillary: 216 mg/dL — ABNORMAL HIGH (ref 65–99)

## 2016-01-07 LAB — CBC
HEMATOCRIT: 29.5 % — AB (ref 39.0–52.0)
Hemoglobin: 9.3 g/dL — ABNORMAL LOW (ref 13.0–17.0)
MCH: 29.3 pg (ref 26.0–34.0)
MCHC: 31.5 g/dL (ref 30.0–36.0)
MCV: 93.1 fL (ref 78.0–100.0)
Platelets: 176 10*3/uL (ref 150–400)
RBC: 3.17 MIL/uL — ABNORMAL LOW (ref 4.22–5.81)
RDW: 15.2 % (ref 11.5–15.5)
WBC: 11.5 10*3/uL — ABNORMAL HIGH (ref 4.0–10.5)

## 2016-01-07 MED ORDER — GLUCERNA SHAKE PO LIQD: 237.0000 mL | Freq: Three times a day (TID) | ORAL | Status: DC | PRN

## 2016-01-07 MED ORDER — WARFARIN - PHARMACIST DOSING INPATIENT: Freq: Every day | Status: DC

## 2016-01-07 MED ORDER — PIPERACILLIN-TAZOBACTAM IN DEX 2-0.25 GM/50ML IV SOLN
2.2500 g | Freq: Four times a day (QID) | INTRAVENOUS | Status: DC
  Administered 2016-01-07 – 2016-01-08 (×2): 2.25 g via INTRAVENOUS
  Filled 2016-01-07 (×3): qty 50

## 2016-01-07 MED ORDER — INSULIN GLARGINE 100 UNIT/ML ~~LOC~~ SOLN
20.0000 [IU] | Freq: Every day | SUBCUTANEOUS | Status: DC
  Administered 2016-01-07 – 2016-01-10 (×4): 20 [IU] via SUBCUTANEOUS
  Filled 2016-01-07 (×4): qty 0.2

## 2016-01-07 MED ORDER — WARFARIN SODIUM 5 MG PO TABS
5.0000 mg | ORAL_TABLET | Freq: Once | ORAL | Status: AC
  Administered 2016-01-07: 5 mg via ORAL
  Filled 2016-01-07: qty 1

## 2016-01-07 NOTE — Progress Notes (Signed)
STROKE TEAM PROGRESS NOTE   HISTORY OF PRESENT ILLNESS Eric [name] is a 82 y.o. male with multiple medical problems who was admitted to the hospital on 3/7 with shortness of breath. He has a hx of aortic stenosis s/p bioprosthetic valve replacement and is on asa 81. Apparently after being here for over 10 days he started to have somewhat slurred speech and confusion since yesterday, which triggered the team to order a brain MRI that shows a few right sided embolic appearing strokes. Neurology was consulted earlier today for this reason and when I arrived at 7pm I was asked to see pt in consultation. Pt is somewhat confused and unable to provide further hx.   SUBJECTIVE (INTERVAL HISTORY) His RN is at the bedside.  Overall he feels his condition is gradually improving.     OBJECTIVE Temp:  [97.9 F (36.6 C)-100.5 F (38.1 C)] 97.9 F (36.6 C) (03/20 0607) Pulse Rate:  [96-100] 96 (03/19 2253) Cardiac Rhythm:  [-] Atrial fibrillation (03/20 0700) Resp:  [18-20] 20 (03/20 0607) BP: (93-110)/(41-57) 94/57 mmHg (03/20 0607) SpO2:  [92 %-96 %] 96 % (03/20 1013) Weight:  [90.901 kg (200 lb 6.4 oz)] 90.901 kg (200 lb 6.4 oz) (03/20 0255)  CBC:  Recent Labs Lab 01/05/16 0352 01/07/16 0214  WBC 12.6* 11.5*  HGB 10.0* 9.3*  HCT 31.6* 29.5*  MCV 93.2 93.1  PLT 255 0000000    Basic Metabolic Panel:  Recent Labs Lab 01/06/16 0220 01/07/16 0214  NA 136 139  K 4.1 3.8  CL 95* 98*  CO2 29 29  GLUCOSE 324* 339*  BUN 39* 47*  CREATININE 2.33* 2.84*  CALCIUM 8.1* 7.9*    Lipid Panel:    Component Value Date/Time   CHOL 107 01/07/2016 0214   TRIG 112 01/07/2016 0214   HDL 33* 01/07/2016 0214   CHOLHDL 3.2 01/07/2016 0214   VLDL 22 01/07/2016 0214   LDLCALC 52 01/07/2016 0214   HgbA1c:  Lab Results  Component Value Date   HGBA1C 7.1* 12/25/2015   Urine Drug Screen: No results found for: LABOPIA, COCAINSCRNUR, LABBENZ, AMPHETMU, THCU, LABBARB    IMAGING  Dg Chest 2  View 01/06/2016   Mild posterior left lower lobe infiltrate.   Mr Brain Wo Contrast 01/06/2016   Multiple small areas of acute infarct including the right cerebellum, right basal ganglia, and right parietal lobe. Probable emboli. Atrophy and chronic microvascular ischemia Image quality degraded by motion.   PHYSICAL EXAM Pleasant elderly male not in distress. . Afebrile. Head is nontraumatic. Neck is supple without bruit.    Cardiac exam no murmur or gallop. Lungs are clear to auscultation. Distal pulses are well felt. Neurological Exam ;  Awake  Alert oriented x 3. Normal speech and language.eye movements full without nystagmus.fundi were not visualized. Vision acuity and fields appear normal. Hearing is normal. Palatal movements are normal. Face symmetric. Tongue midline. Normal strength, tone, reflexes and coordination. Normal sensation. Gait deferred.     ASSESSMENT/PLAN [name] is a 82 y.o. male with history of cerebrovascular disease, coronary artery disease, hyperlipidemia, hypertension, diabetes mellitus, and aortic stenosis status post prosthetic valve replacement, presenting with slurred speech and confusion. He did not receive IV t-PA due to late presentation.  Stroke:  Non-dominant multiple small right-sided infarcts probably embolic from an unknown source.  Resultant   Gait ataxia  MRI  Multiple small areas of acute infarct including the right cerebellum, right basal ganglia, and right parietal lobe.  MRA  not performed  Carotid Doppler 1-39% ICA stenosis. Vertebral artery flow is antegrade.   2D Echo - EF 60-65%. No cardiac source of emboli identified.  LDL 52  HgbA1c 7.1  VTE prophylaxis - subcutaneous heparin  DIET - DYS 1 Room service appropriate?: Yes; Fluid consistency:: Thin  aspirin 81 mg daily prior to admission, now on aspirin 325 mg daily  Patient counseled to be compliant with his antithrombotic medications  Ongoing aggressive stroke  risk factor management  Therapy recommendations: Pending  Disposition: Pending  Hypertension  Blood pressure tends to run low - on Cardizem and Zebeta  Permissive hypertension (OK if < 220/120) but gradually normalize in 5-7 days  Hyperlipidemia  Home meds: Zocor 40 mg daily - discontinued  LDL 52, goal < 70  Restart Zocor ?  Continue statin at discharge  Diabetes  HgbA1c 7.1, goal < 7.0  Controlled  Other Stroke Risk Factors  Advanced age  Cigarette smoker, quit smoking 22 years ago. Headvised to  Continue to stop smoking  ETOH use  Obesity, Body mass index is 29.58 kg/(m^2).   Coronary artery disease   Other Active Problems  Anemia  Kidney disease  Hospital day # 13  I have personally examined this patient, reviewed notes, independently viewed imaging studies, participated in medical decision making and plan of care. I have made any additions or clarifications directly to the above note. Agree with note above. He presented with slurred speech and confusion due to her tiny right cerebellar infarct etiology to be determined. He remains at risk for neurological worsening, recurrent stroke, TIA needs ongoing stroke evaluation and aggressive risk factor modification.  Antony Contras, MD Medical Director [place] Pager: [phone] 01/07/2016 10:25 PM     To contact Stroke Continuity provider, please refer to [email or web address] After hours, contact General Neurology
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
## 2016-01-07 NOTE — Progress Notes (Addendum)
[facility] TEAM PROGRESS NOTE  Eric [name] IRW:[id number] DOB: July 27, 1927 DOA: 12/25/2015 PCP: [name], MD  Admit HPI / Brief Narrative: 82 y.o. male who c/o a 2 day history of generalized weakness which lead to him falling out of bed 2 days prior. He developed hip pain from this fall. He also noted the new onset wheezing and coughing with occasional phlegm production, and a runny nose w/ upper respiratory congestion. He also noted a very poor appetite with very little oral intake.   He came to the ER and was found to be tachycardic (had P waves - was irregular - possible MAT).  He was treated with nebs with improvement of resp failure and tightness. His HR continued to climb to 135. He was given Cardizem 20 mg with BP dropping after. He was given a fluid bolus, and a dilt drip at 5 mg was begun.   HPI/Subjective: He is sleepy today, more lethargic.   Assessment/Plan: SIRS, Bacteremia, PNA:  On admission patient met guidelines for SIRS w/ HR> 90, RR> 20, WBC> 12 - no infiltrate on CXR - no + culture results - UA not c/w UTI . LFT normal  Spike fever 3-19, Chest X ray: mild posterior left lower infiltrates.  Continue with vancomycin and Zosyn.   Blood culture 3-19 growing: Gram positive Cocci in chains and cluster.   Acute stroke, multiple infarcts;  Likely embolic, in setting of A fib.  Will discussed with family regarding anticoagulation.  Await also recommendation from stroke team.   Acute COPD exac - acute hypoxic resp failure  No infiltrate on CXR - slowly improving, but volume overload likely contributing to increased O2 requirement at this time - will likely require home O2 - check ambulatory sat after diuresis - Continue with Pulmicort, schedule nebulizer,  prednisone taper.   Tachyarrhythmia - Sinus Tachy w/ non-conducted PACs v/s MAT > Aflutter > A fib w/ RVR Cardiology following - IV cardizem now transitioned to oral - TSH normal - high  risk for anticoag given recent falls - Cards suggests ASA for now and reconsider anticaog in outpt f/u - no amio due to severe lung disease  Continue with digoxin, Bisoprolol. Verapamil  Discussed with family, patient prior to admission was active. He fell prior to admission because he was sick. Prior to admission , he used to go to grocery store, restaurants with assistance of son. They understand risk for bleeding and with risk of falling. Patient will be discharge to SNF. They agree with coumadin,, and they will follow up outpatient with Dr Donnal Moat for further management. Will await also recommendation from stroke team prior starting coumadin.  I also explain to family multiple medical problems that [name] has.   Acute mental status change -Multifactorial to include SIRS, Ativan, steroids, acute on chronic renal failure, sundowning  -CT head unrevealing  -Worsening MS 3-19, MRI positive for stroke. Spike fever 3-19.   Dysphagia Cleared for D1 diet w/ thin liquids per SLP   DM2 - acutely severely uncontrolled  A1c 7.1 - required insulin gtt earlier in stay - CBG reasonably controlled - follow w/o change today  Lantus 20 units.   N/V - resolved  KUB noted gaseous distention of stomach on BIPAP - suspect bloating was mostly air blown in w/ BIPAP - simethicone - resolved   Constipation; had 2 BM overnight   Acute on CKD Stage 3 Baseline creatinine 1.6 - creatinine at presentation 2.58 w/ peak of 3.12 -  avoid ACE/ARB - crt now at ~baseline  [facility] Weights   01/05/16 0606 01/06/16 0421 01/07/16 0255  Weight: 96.344 kg (212 lb 6.4 oz) 93.622 kg (206 lb 6.4 oz) 90.901 kg (200 lb 6.4 oz)   Cr at 2.3. Hold diuretics.   Acute Diastolic HF;  Good urine out put.  Cardiology managing  Holding torsemide due to increase cr.   Hyponatremia Appears to have been due to hypovolemia and hyperglycemia - resolved   Coronary artery disease status post CABG x3 2010 Cont ASA + lipid med  Aortic  stenosis status post valve replacement 2010 Pericardial tissue valve stable on TTE   Carotid artery stenosis  Dopplers May 2015 showed a 60-79% right and 1-39% left stenosis  Hyperlipidemia Cont med tx  GERD  HTN Not an active issue at this time   Code Status: DO NOT RESUSCITATE Family Communication: daughter 4-19 Disposition Plan: awaiting improvement of renal function, now with bacteremia  Consultants: [facility] Cards   Procedures: 3/9 TTE - EF 60-65% - Aortic valve: A bioprosthesis was present and functioning normally - Mitral valve: severe stenosis. Mean gradient (D): 11 mm Hg - Right ventricle: size was normal. Wall thickness normal. Systolic function was normal - Tricuspid valve: trivial regurgitation.  Antibiotics: levaquin 3/7 > 3/13  DVT prophylaxis: Subcutaneous heparin  Objective: Blood pressure 92/50, pulse 91, temperature 98.9 F (37.2 C), temperature source Oral, resp. rate 18, height 5' 9"  (1.753 m), weight 90.901 kg (200 lb 6.4 oz), SpO2 95 %.  Intake/Output Summary (Last 24 hours) at 01/07/16 1525 Last data filed at 01/07/16 1338  Gross per 24 hour  Intake    130 ml  Output    525 ml  Net   -395 ml   Filed Weights   01/05/16 0606 01/06/16 0421 01/07/16 0255  Weight: 96.344 kg (212 lb 6.4 oz) 93.622 kg (206 lb 6.4 oz) 90.901 kg (200 lb 6.4 oz)   Exam: General: Lethargic  Lungs: distant bs th/o - mild basilar crackles  Cardiovascular: reg rate at 90bpm - no signif M - no rub  Abdomen: NT/ND, soft, bs+, no mass, no rebound  Extremities: no significant cyanosis, or clubbing - 2+ edema bilateral lower extremities  Data Reviewed:  Basic Metabolic Panel:  Recent Labs Lab 01/03/16 0843 01/04/16 0521 01/05/16 0352 01/06/16 0220 01/07/16 0214  NA 142 140 139 136 139  K 2.9* 3.5 4.1 4.1 3.8  CL 105 97* 96* 95* 98*  CO2 29 31 29 29 29   GLUCOSE 198* 279* 319* 324* 339*  BUN 23* 25* 33* 39* 47*  CREATININE 1.86* 2.09* 2.05* 2.33* 2.84*  CALCIUM  8.3* 8.1* 8.3* 8.1* 7.9*    CBC:  Recent Labs Lab 01/05/16 0352 01/07/16 0214  WBC 12.6* 11.5*  HGB 10.0* 9.3*  HCT 31.6* 29.5*  MCV 93.2 93.1  PLT 255 176   Liver Function Tests:  Recent Labs Lab 01/06/16 1736  AST 22  ALT 19  ALKPHOS 48  BILITOT 1.2  PROT 5.6*  ALBUMIN 2.4*   CBG:  Recent Labs Lab 01/06/16 0626 01/06/16 1128 01/06/16 2247 01/07/16 0549 01/07/16 1147  GLUCAP 282* 298* 352* 306* 216*    Recent Results (from the past 240 hour(s))  Culture, blood (routine x 2)     Status: None (Preliminary result)   Collection Time: 01/06/16  5:19 PM  Result Value Ref Range Status   Specimen Description BLOOD RIGHT ARM  Final   Special Requests   Final    BOTTLES  DRAWN AEROBIC AND ANAEROBIC 6CC BLUE 5CC PURPLE   Culture  Setup Time   Final    IN BOTH AEROBIC AND ANAEROBIC BOTTLES GRAM POSITIVE COCCI CRITICAL RESULT CALLED TO, READ BACK BY AND VERIFIED WITH: N.WILEY,RN 0602 01/07/16 M.CAMPBELL    Culture NO GROWTH < 24 HOURS  Final   Report Status PENDING  Incomplete  Culture, blood (routine x 2)     Status: None (Preliminary result)   Collection Time: 01/06/16  5:28 PM  Result Value Ref Range Status   Specimen Description BLOOD LEFT ARM  Final   Special Requests BOTTLES DRAWN AEROBIC AND ANAEROBIC 10CC  Final   Culture  Setup Time   Final    IN BOTH AEROBIC AND ANAEROBIC BOTTLES GRAM POSITIVE COCCI IN CHAINS IN CLUSTERS CRITICAL RESULT CALLED TO, READ BACK BY AND VERIFIED WITH: G.RASUL,RN [id number] 01/07/16 M.CAMPBELL    Culture NO GROWTH < 24 HOURS  Final   Report Status PENDING  Incomplete    Studies:   Recent x-ray studies have been reviewed in detail by the Attending Physician  Scheduled Meds:  Scheduled Meds: . antiseptic oral rinse  7 mL Mouth Rinse BID  . aspirin  324 mg Oral Daily  . atorvastatin  20 mg Oral q1800  . bisoprolol  2.5 mg Oral Daily  . budesonide (PULMICORT) nebulizer solution  0.25 mg Nebulization BID  . diltiazem  120 mg  Oral 3 times per day  . feeding supplement (GLUCERNA SHAKE)  237 mL Oral TID BM  . heparin subcutaneous  5,000 Units Subcutaneous 3 times per day  . insulin aspart  0-20 Units Subcutaneous TID WC  . insulin aspart  0-5 Units Subcutaneous QHS  . insulin glargine  20 Units Subcutaneous Daily  . pantoprazole sodium  40 mg Oral BID  . piperacillin-tazobactam (ZOSYN)  IV  2.25 g Intravenous Q6H  . polyethylene glycol  17 g Oral BID  . predniSONE  30 mg Oral Q breakfast  . sodium chloride flush  3 mL Intravenous Q12H    Time spent on care of this patient: 35 mins   Elmarie Shiley , MD  [place]  [phone] Pager - Text Page per Amion as per below:  On-Call/Text Page:      [email or web address]      password [email or web address]  If 7PM-7AM, please contact night-coverage [email or web address] Password [email or web address] 01/07/2016, 3:25 PM   LOS: 13 days

## 2016-01-07 NOTE — Progress Notes (Addendum)
Subjective: He said his breathing is a little more short since he had the ultrasound.    Objective: Vital signs in last 24 hours: Temp:  [97.9 F (36.6 C)-100.5 F (38.1 C)] 97.9 F (36.6 C) (03/20 0607) Pulse Rate:  [96-100] 96 (03/19 2253) Resp:  [18-20] 20 (03/20 0607) BP: (93-110)/(41-57) 94/57 mmHg (03/20 0607) SpO2:  [92 %-96 %] 94 % (03/19 2253) Weight:  [200 lb 6.4 oz (90.901 kg)] 200 lb 6.4 oz (90.901 kg) (03/20 0255) Last BM Date: 01/06/16  Intake/Output from previous day: 03/19 0701 - 03/20 0700 In: 240 [P.O.:240] Out: 975 [Urine:975] Intake/Output this shift:    Medications Scheduled Meds: . antiseptic oral rinse  7 mL Mouth Rinse BID  . aspirin  324 mg Oral Daily  . atorvastatin  20 mg Oral q1800  . bisoprolol  2.5 mg Oral Daily  . budesonide (PULMICORT) nebulizer solution  0.25 mg Nebulization BID  . diltiazem  120 mg Oral 3 times per day  . feeding supplement (GLUCERNA SHAKE)  237 mL Oral TID BM  . heparin subcutaneous  5,000 Units Subcutaneous 3 times per day  . insulin aspart  0-20 Units Subcutaneous TID WC  . insulin aspart  0-5 Units Subcutaneous QHS  . insulin glargine  20 Units Subcutaneous Daily  . pantoprazole sodium  40 mg Oral BID  . piperacillin-tazobactam (ZOSYN)  IV  3.375 g Intravenous 3 times per day  . polyethylene glycol  17 g Oral BID  . predniSONE  30 mg Oral Q breakfast  . sodium chloride flush  3 mL Intravenous Q12H  . vancomycin  1,000 mg Intravenous Q24H   Continuous Infusions:  PRN Meds:.acetaminophen **OR** acetaminophen, hydrALAZINE, ipratropium, levalbuterol, ondansetron **OR** ondansetron (ZOFRAN) IV, RESOURCE THICKENUP CLEAR, simethicone  PE: General appearance: alert, cooperative and no distress Lungs: Left sided wheezing Heart: irregularly irregular rhythm Abdomen: +BS nontedner Extremities: Trace LEE Pulses: 2+ and symmetric Skin: warm and dry Neurologic: Speech slurred/garble.  I do not know what baseline is.    Lab Results:   Recent Labs  01/05/16 0352 01/07/16 0214  WBC 12.6* 11.5*  HGB 10.0* 9.3*  HCT 31.6* 29.5*  PLT 255 176   BMET  Recent Labs  01/05/16 0352 01/06/16 0220 01/07/16 0214  NA 139 136 139  K 4.1 4.1 3.8  CL 96* 95* 98*  CO2 29 29 29   GLUCOSE 319* 324* 339*  BUN 33* 39* 47*  CREATININE 2.05* 2.33* 2.84*  CALCIUM 8.3* 8.1* 7.9*   PT/INR No results for input(s): LABPROT, INR in the last 72 hours. Cholesterol  Recent Labs  01/07/16 0214  CHOL 107   Lipid Panel     Component Value Date/Time   CHOL 107 01/07/2016 0214   TRIG 112 01/07/2016 0214   HDL 33* 01/07/2016 0214   CHOLHDL 3.2 01/07/2016 0214   VLDL 22 01/07/2016 0214   LDLCALC 52 01/07/2016 0214   LDLDIRECT 51.0 03/05/2015 1230     Assessment/Plan   Principal Problem:   Acute respiratory distress ([name] Active Problems:   HYPERCHOLESTEROLEMIA   Bronchitis   Diabetes mellitus with complication ([name]   S/P CABG (coronary artery bypass graft)   S/P AVR (aortic valve replacement)   Atrial fibrillation with RVR ([name]   Multifocal atrial tachycardia ([name]   Sepsis ([name]   COPD exacerbation ([name]   Metabolic acidosis   Dilated cardiomyopathy ([name]   Pulmonary hypertension ([name]   CAD in native artery   H/O aortic valve replacement  Uncontrolled type 2 diabetes mellitus with complication ([name]   Hyponatremia   Hypotension   Acute renal failure superimposed on stage 3 chronic kidney disease ([name]   Delirium   Acute renal failure ([name]   ATRIAL FIB WITH RVR:  Telemetry reviewed. Rate-controlled on cardizem and BB. Previously not felt to be an anticoagulation candidate.  (See my further comments below.Marland KitchenMarland KitchenRon Parker)  CAD: Medical management. On ASA, lipitor, BB, cardizem. No plans for further ischemia work up.   AVR: Normal function on echo.   Volume Overload: combination AKI and treatment for SIRS/Sepsis, up +4L  Net fluids:  -0.7L/+4.0L.  Diuresis held yesterday due rising SCr  which continues to worsen: 2.05>>2.33>>2.84.   He's had some hypotension which maybe contributing to rise in SCr.  Plan today to continue medications for heart rate control.  CXR yesterday:  Mild posterior LLL infiltrate. The patient's creatinine continues to rise. He is receiving vancomycin. Diuretics had been held yesterday. Consideration was to be given to possibly resuming diuresis today. The patient does have mild edema. However his chest x-ray recently did not show CHF and his input/output has not been significantly increased in the past day or 2. I am hesitant to resume his diuretic today as he has relative hypotension. Plan to continue to hold the diuretic today. (Azai Gaffin)  Acute CVA MRI: Multiple small areas of acute infarct including the right cerebellum, right basal ganglia, and right parietal lobe. Probable emboli.  Neuro following.  ASA increased to 325.  Chronic Afib which may be the source.  Previously not felt to be an anticoagulation candidate.   Needs xopenex this morning.  PT/OT pending.    LOS: 13 days    HAGER, BRYAN PA-C 01/07/2016 8:09 AM  Patient seen and examined. I agree with the assessment and plan as detailed above. See also my additional thoughts below.   I have adjusted the assessment and plan above specifically as it relates to the patient's volume status and renal function. Earlier in this admission it was felt that he was not an optimal candidate for prophylactic anticoagulation for his atrial fibrillation. However, it appears that he has probably had an embolic CVA during this admission. Neurology has recommended anticoagulation. Considering all of these issues, I feel that anticoagulation should be used if the family is in agreement, and if the patient's overall status stabilizes. Consideration can be given to starting Coumadin. However this decision will have to be reviewed daily. Specifically, we will need to be sure that this remains the optimal recommendation at the  time of discharge.  Dola Argyle, MD, [facility] 01/07/2016 10:19 AM

## 2016-01-07 NOTE — Progress Notes (Signed)
Upon routine physical assessment a stage 1 pressure ulcer found between pt buttox. Skin is red, there is MASD but skin is intact. Pt unable to turn self and refuses to be repositioned from time to time. A sacral foam dressing was applied. Pillows placed under L side to relieve pressure. Importance of repositioning explained to pt. Pt hesitant to be repositioned by willing to let us turn him q 2 hrs.

## 2016-01-07 NOTE — Progress Notes (Signed)
ANTICOAGULATION CONSULT NOTE - Initial Consult  Pharmacy Consult for Coumadin Indication: atrial fibrillation, likely embolic CVA  No Known Allergies  Patient Measurements: Height: 5\' 9"  (175.3 cm) Weight: 200 lb 6.4 oz (90.901 kg) (bed scale) IBW/kg (Calculated) : 70.7  Vital Signs: Temp: 98.9 F (37.2 C) (03/20 1151) Temp Source: Oral (03/20 1151) BP: 92/50 mmHg (03/20 1151) Pulse Rate: 89 (03/20 2030)  Labs:  Recent Labs  01/05/16 0352 01/06/16 0220 01/07/16 0214  HGB 10.0*  --  9.3*  HCT 31.6*  --  29.5*  PLT 255  --  176  CREATININE 2.05* 2.33* 2.84*    Estimated Creatinine Clearance: 20.4 mL/min (by C-G formula based on Cr of 2.84).  Assessment:  Rx assisting with antibiotic dosing.  Now to begin Coumadin for afib and stroke, likely embolic.  Baseline INR 1.20 on 12/26/15.    Aspirin increased from 81 to 325 mg on 01/06/16.  Also on Heparin 5000 units sq q8hrs.  Goal of Therapy:  INR 2-3 Monitor platelets by anticoagulation protocol: Yes   Plan:   Coumadin 5 mg x 1 tonight.  Daily PT/INR.  Stop sq heparin when INR >2.  Decrease Aspirin back to 81 mg at some point?  Arty Baumgartner, [place] Pager: [phone] 01/07/2016,8:50 PM

## 2016-01-07 NOTE — Consult Note (Signed)
   [facility] CM Inpatient Consult   01/07/2016  Eric [name] 01/27/1927 [id number] Patient screened for potential [facility] services.  Patient's current plan with his family is for skilled nursing at a facility per social worker's notes.   [facility] Care Management will not follow at this time. If patient's post hospital needs change please place a [facility] Care Management consult. For questions please contact:   Natividad Brood, RN BSN [facility] Liaison  [phone] business mobile phone Toll free office [phone]

## 2016-01-07 NOTE — Progress Notes (Signed)
Nutrition Follow-up  DOCUMENTATION CODES:   Obesity unspecified  INTERVENTION:  Continue Glucerna Shakes TID PRN Continue Magic Cup TID with meal trays  Monitor plan for goals of care  NUTRITION DIAGNOSIS:   Inadequate oral intake related to poor appetite as evidenced by meal completion < 25%.  Ongoing  GOAL:   Patient will meet greater than or equal to 90% of their needs  Unmet  MONITOR:   PO intake, Supplement acceptance, Diet advancement, Labs, Weight trends, Skin, I & O's  REASON FOR ASSESSMENT:   Consult Assessment of nutrition requirement/status  ASSESSMENT:   Patient reports 2 day history of generalized weakness which resulted in patient falling out of bed 2 days ago. Developed hip pain from this fall which persisted into today. History of hip fracture and surgical repair. Over the last 2 days patient has had new onset wheezing and coughing with occasional phlegm production. Denies any fevers, chest pain, palpitations, LOC, nausea, vomiting. Patient does endorse runny nose and upper respiratory congestion. Very poor appetite during this period time. Very little oral intake is pushing fluids. Patient states that he smoked for 50 years approximately one pack per day but quit approximately 20 years ago.  Pt asleep at time of visit. Per RN, pt is not eating as he is sleeping most of the time. RN woke pt up with sternal rub this AM and got pt to take a couple bites of applesauce with medications. His weight is down 12 lbs from admission weight. Suggested that RN use Magic Cup ice cream to administer medications.   Labs: high glucose ranging 216 to 352 mg/dL, low calcium, low GFR, high BUN/creatinine, low hemoglobin  Diet Order:  DIET - DYS 1 Room service appropriate?: Yes; Fluid consistency:: Thin  Skin:  Wound (see comment) (stage I pressure ulcer to buttocks)  Last BM:  3/19  Height:   Ht Readings from Last 1 Encounters:  12/25/15 5\' 9"  (1.753 m)    Weight:    Wt Readings from Last 1 Encounters:  01/07/16 200 lb 6.4 oz (90.901 kg)    Ideal Body Weight:  72.7 kg  BMI:  Body mass index is 29.58 kg/(m^2).  Estimated Nutritional Needs:   Kcal:  1800-2000  Protein:  85-100 grams  Fluid:  1.8-2.0 L  EDUCATION NEEDS:   Education needs addressed  Scarlette Ar RD, LDN Inpatient Clinical Dietitian Pager: [phone] After Hours Pager: [phone]

## 2016-01-07 NOTE — Progress Notes (Signed)
VASCULAR LAB PRELIMINARY  PRELIMINARY  PRELIMINARY  PRELIMINARY  Carotid duplex completed.    Preliminary report:  1-39% ICA stenosis.  Vertebral artery flow is antegrade.   Marticia Reifschneider, RVT 01/07/2016, 8:28 AM

## 2016-01-07 NOTE — Progress Notes (Signed)
Pharmacy Antibiotic Note  Eric [name] is a 82 y.o. male admitted on 12/25/2015 with weakness and hip pain.  Pharmacy has been consulted for vancomycin and Zosyn dosing. Renal function is worsening and UOP is low today. Patient received vancomycin 1750 mg load yesterday. Blood cultures are showing 2/2 GPC.  Plan: - Discontinue scheduled vancomycin dose - Random vancomycin level with am labs - will redose if <20 - Change Zosyn to 2.25 g IV q6h - Follow-up speciation of cultures and renal function  Height: 5\' 9"  (175.3 cm) Weight: 200 lb 6.4 oz (90.901 kg) (bed scale) IBW/kg (Calculated) : 70.7  Temp (24hrs), Avg:98.5 F (36.9 C), Min:97.9 F (36.6 C), Max:98.9 F (37.2 C)   Recent Labs Lab 01/03/16 0843 01/04/16 0521 01/05/16 0352 01/06/16 0220 01/07/16 0214  WBC  --   --  12.6*  --  11.5*  CREATININE 1.86* 2.09* 2.05* 2.33* 2.84*    Estimated Creatinine Clearance: 20.4 mL/min (by C-G formula based on Cr of 2.84).    No Known Allergies  Antimicrobials this admission: Levaquin 3/7 >> 3/13 Zosyn 3/19 >>  Vancomycin 3/19 >>   Dose adjustments this admission: none  Microbiology results: 3/20 UCx: pending  3/19 BCx2: 2/2 GPC chains in clusters  3/7 MRSA PCR negative  3/9 BC NGTD  3/9 BC NGTD  3/7 Flu negative   Thank you for allowing pharmacy to be a part of this patient's care.  [place], Pharm.D., BCPS Clinical Pharmacist Pager: [phone] 01/07/2016 2:04 PM

## 2016-01-07 NOTE — Care Management Important Message (Signed)
Important Message  Patient Details  Name: CHEO BANAAG MRN: [id number] Date of Birth: 1927/04/18   Medicare Important Message Given:  Yes    Barb Merino Mindel Friscia 01/07/2016, 3:34 PM

## 2016-01-08 DIAGNOSIS — Z952 Presence of prosthetic heart valve: Secondary | ICD-10-CM

## 2016-01-08 DIAGNOSIS — B951 Streptococcus, group B, as the cause of diseases classified elsewhere: Secondary | ICD-10-CM

## 2016-01-08 DIAGNOSIS — I350 Nonrheumatic aortic (valve) stenosis: Secondary | ICD-10-CM

## 2016-01-08 DIAGNOSIS — I668 Occlusion and stenosis of other cerebral arteries: Secondary | ICD-10-CM

## 2016-01-08 LAB — BASIC METABOLIC PANEL
Anion gap: 12 (ref 5–15)
BUN: 48 mg/dL — AB (ref 6–20)
CALCIUM: 7.7 mg/dL — AB (ref 8.9–10.3)
CHLORIDE: 98 mmol/L — AB (ref 101–111)
CO2: 30 mmol/L (ref 22–32)
CREATININE: 2.62 mg/dL — AB (ref 0.61–1.24)
GFR, EST AFRICAN AMERICAN: 24 mL/min — AB (ref >60)
GFR, EST NON AFRICAN AMERICAN: 20 mL/min — AB (ref >60)
Glucose, Bld: 307 mg/dL — ABNORMAL HIGH (ref 65–99)
Potassium: 3.7 mmol/L (ref 3.5–5.1)
Sodium: 140 mmol/L (ref 135–145)

## 2016-01-08 LAB — URINE CULTURE

## 2016-01-08 LAB — CBC
HCT: 27.5 % — ABNORMAL LOW (ref 39.0–52.0)
Hemoglobin: 8.5 g/dL — ABNORMAL LOW (ref 13.0–17.0)
MCH: 29.1 pg (ref 26.0–34.0)
MCHC: 30.9 g/dL (ref 30.0–36.0)
MCV: 94.2 fL (ref 78.0–100.0)
Platelets: 134 10*3/uL — ABNORMAL LOW (ref 150–400)
RBC: 2.92 MIL/uL — ABNORMAL LOW (ref 4.22–5.81)
RDW: 15.2 % (ref 11.5–15.5)
WBC: 6.7 10*3/uL (ref 4.0–10.5)

## 2016-01-08 LAB — PROTIME-INR
INR: 1.26 (ref 0.00–1.49)
PROTHROMBIN TIME: 15.9 s — AB (ref 11.6–15.2)

## 2016-01-08 LAB — GLUCOSE, CAPILLARY
GLUCOSE-CAPILLARY: 280 mg/dL — AB (ref 65–99)
GLUCOSE-CAPILLARY: 148 mg/dL — AB (ref 65–99)
Glucose-Capillary: 272 mg/dL — ABNORMAL HIGH (ref 65–99)
Glucose-Capillary: 252 mg/dL — ABNORMAL HIGH (ref 65–99)

## 2016-01-08 LAB — VANCOMYCIN, RANDOM: VANCOMYCIN RM: 10 ug/mL

## 2016-01-08 MED ORDER — PIPERACILLIN-TAZOBACTAM 3.375 G IVPB
3.3750 g | Freq: Three times a day (TID) | INTRAVENOUS | Status: DC
  Administered 2016-01-08: 3.375 g via INTRAVENOUS
  Filled 2016-01-08 (×3): qty 50

## 2016-01-08 MED ORDER — SODIUM CHLORIDE 0.9 % IV SOLN
2.0000 g | INTRAVENOUS | Status: DC
  Administered 2016-01-08 – 2016-01-10 (×11): 2 g via INTRAVENOUS
  Filled 2016-01-08 (×14): qty 2000

## 2016-01-08 NOTE — Consult Note (Signed)
[facility]       Reason for Consult: GBS bacteremia    Referring Physician: Dr. Tyrell Antonio  Principal Problem:   Acute respiratory distress ([place]) Active Problems:   HYPERCHOLESTEROLEMIA   Bronchitis   Diabetes mellitus with complication ([name]   S/P CABG (coronary artery bypass graft)   S/P AVR (aortic valve replacement)   Atrial fibrillation with RVR ([name]   Multifocal atrial tachycardia ([name]   Sepsis ([name]   COPD exacerbation ([name]   Metabolic acidosis   Dilated cardiomyopathy ([name]   Pulmonary hypertension ([name]   CAD in native artery   H/O aortic valve replacement   Uncontrolled type 2 diabetes mellitus with complication ([name]   Hyponatremia   Hypotension   Acute renal failure superimposed on stage 3 chronic kidney disease ([name]   Delirium   Acute renal failure ([name]   Cerebral thrombosis with cerebral infarction   Pressure ulcer   . antiseptic oral rinse  7 mL Mouth Rinse BID  . aspirin  324 mg Oral Daily  . atorvastatin  20 mg Oral q1800  . bisoprolol  2.5 mg Oral Daily  . budesonide (PULMICORT) nebulizer solution  0.25 mg Nebulization BID  . diltiazem  120 mg Oral 3 times per day  . heparin subcutaneous  5,000 Units Subcutaneous 3 times per day  . insulin aspart  0-20 Units Subcutaneous TID WC  . insulin aspart  0-5 Units Subcutaneous QHS  . insulin glargine  20 Units Subcutaneous Daily  . pantoprazole sodium  40 mg Oral BID  . piperacillin-tazobactam (ZOSYN)  IV  3.375 g Intravenous Q8H  . polyethylene glycol  17 g Oral BID  . predniSONE  30 mg Oral Q breakfast  . sodium chloride flush  3 mL Intravenous Q12H    Recommendations: ampcillin Repeat blood cultures TEE   Assessment: He has GBS bacteremia in the setting of a prosthetic valve and no obvious source.  He also has cerebral emboli concerning for endocarditis.     Antibiotics: zosyn  HPI: Eric [name] is a 82 y.o. male with aortic stenosis with bioprosthetic  valve replacement, admitted on 3/7 for sob.  He was being treated for wheezing and around 3/19 had stroke-like symptoms.  MRI noted emboli.   No fever, no chills.  Wants to go home.  Blood culture 3/19 with GBS, 2/2.   MRI independently reviewed and emboli noted.   Review of Systems:  Constitutional: negative for fevers, chills and malaise Cardiovascular: negative for chest pain Gastrointestinal: negative for diarrhea All other systems reviewed and are negative   Past Medical History  Diagnosis Date  . Cerebrovascular disease, unspecified   . Coronary atherosclerosis of unspecified type of vessel, native or graft     s/p CABG 2010  . Esophageal reflux   . Pure hypercholesterolemia   . Essential hypertension, benign   . Type II or unspecified type diabetes mellitus with neurological manifestations, not stated as uncontrolled   . Bilateral hydrocele 3/11    [facility]  . History of aortic stenosis     s/p valve replacment 2010  . Cervicalgia   . Foraminal stenosis of cervical region     bilateral-C4-C5, C5-C6  . Colon polyp   . Shingles 07/27/2012    Social History  Substance Use Topics  . Smoking status: Former Smoker -- 1.00 packs/day for 50 years    Types: Cigarettes    Quit date: 10/20/1993  . Smokeless tobacco: Never Used  .  Alcohol Use: 0.0 oz/week    0 Standard drinks or equivalent per week     Comment: occasional    Family History  Problem Relation Age of Onset  . Heart attack Father 51  . Breast cancer Mother 66  . Brain cancer Sister   . Prostate cancer Brother   . Diabetes Neg Hx   . Stroke Neg Hx     No Known Allergies  Physical Exam: Constitutional: in no apparent distress and alert  Filed Vitals:   01/08/16 0542 01/08/16 1216  BP: 103/54 98/46  Pulse: 87 88  Temp: 98.4 F (36.9 C) 98.3 F (36.8 C)  Resp: 18 18   EYES: anicteric ENMT: no thrush Cardiovascular: Cor RRR Respiratory: CTA B; normal respiratory effort GI: Bowel sounds are  normal, liver is not enlarged, spleen is not enlarged Musculoskeletal: no pedal edema noted Skin: negatives: no rash Hematologic: no cervical lad  Lab Results  Component Value Date   WBC 6.7 01/08/2016   HGB 8.5* 01/08/2016   HCT 27.5* 01/08/2016   MCV 94.2 01/08/2016   PLT 134* 01/08/2016    Lab Results  Component Value Date   CREATININE 2.62* 01/08/2016   BUN 48* 01/08/2016   NA 140 01/08/2016   K 3.7 01/08/2016   CL 98* 01/08/2016   CO2 30 01/08/2016    Lab Results  Component Value Date   ALT 19 01/06/2016   AST 22 01/06/2016   ALKPHOS 48 01/06/2016     Microbiology: Recent Results (from the past 240 hour(s))  Culture, blood (routine x 2)     Status: None (Preliminary result)   Collection Time: 01/06/16  5:19 PM  Result Value Ref Range Status   Specimen Description BLOOD RIGHT ARM  Final   Special Requests   Final    BOTTLES DRAWN AEROBIC AND ANAEROBIC 6CC BLUE 5CC PURPLE   Culture  Setup Time   Final    IN BOTH AEROBIC AND ANAEROBIC BOTTLES GRAM POSITIVE COCCI CRITICAL RESULT CALLED TO, READ BACK BY AND VERIFIED WITH: N.WILEY,RN 0602 01/07/16 M.CAMPBELL    Culture GROUP B STREP(S.AGALACTIAE)ISOLATED  Final   Report Status PENDING  Incomplete  Culture, blood (routine x 2)     Status: None (Preliminary result)   Collection Time: 01/06/16  5:28 PM  Result Value Ref Range Status   Specimen Description BLOOD LEFT ARM  Final   Special Requests BOTTLES DRAWN AEROBIC AND ANAEROBIC 10CC  Final   Culture  Setup Time   Final    IN BOTH AEROBIC AND ANAEROBIC BOTTLES GRAM POSITIVE COCCI IN CHAINS IN CLUSTERS CRITICAL RESULT CALLED TO, READ BACK BY AND VERIFIED WITH: G.RASUL,RN [id number] 01/07/16 M.CAMPBELL    Culture   Final    GROUP B STREP(S.AGALACTIAE)ISOLATED SUSCEPTIBILITIES TO FOLLOW    Report Status PENDING  Incomplete  Urine culture     Status: None   Collection Time: 01/07/16 12:50 AM  Result Value Ref Range Status   Specimen Description URINE, RANDOM  Final     Special Requests NONE  Final   Culture MULTIPLE SPECIES PRESENT, SUGGEST RECOLLECTION  Final   Report Status 01/08/2016 FINAL  Final    Trust Crago, [name], [facility] [email or web address] [phone] pager  [phone] cell 01/08/2016, 2:56 PM

## 2016-01-08 NOTE — Progress Notes (Signed)
PROGRESS NOTE  Eric [name] RPR:[id number] DOB: July 09, 1927 DOA: 12/25/2015 PCP: [name], MD  Admit HPI / Brief Narrative: 82 y.o. male who c/o a 2 day history of generalized weakness which lead to him falling out of bed 2 days prior. He developed hip pain from this fall. He also noted the new onset wheezing and coughing with occasional phlegm production, and a runny nose w/ upper respiratory congestion. He also noted a very poor appetite with very little oral intake.   He came to the ER and was found to be tachycardic (had P waves - was irregular - possible MAT).  He was treated with nebs with improvement of resp failure and tightness. His HR continued to climb to 135. He was given Cardizem 20 mg with BP dropping after. He was given a fluid bolus, and a dilt drip at 5 mg was begun.   Patient admitted with encephalopathy, SIRS, respiratory failure thought to be secondary to Heart failure exacerbation and presume COPD exacerbation. He was found to be in A fib RVR. He received Treatment for HF, and COPD exacerbation. His Mental status improved until 3-19 when he spike fever, and was notice to be disarthric by family. He underwent MRI which was positive for multiple acute stroke. Also blood culture are positive for Strep B agalactiae. Chest x ray positive for PNA>   Plan is to proceed for TEE 3-22. ID will see patient in consultation. Anticoagulation will need to be revisited after TEE results. Follow neurology recommendation regarding anticoagulation.   HPI/Subjective: More alert, sitting in chair.   Assessment/Plan: SIRS, Bacteremia, PNA: Strep Bacteremia, Agalactiae.  On admission patient met guidelines for SIRS w/ HR> 90, RR> 20, WBC> 12 - no infiltrate on CXR - no + culture results - UA not c/w UTI . LFT normal  Spike fever 3-19, Chest X ray: mild posterior left lower infiltrates.  Continue with Zosyn. Discontinue vancomycin.  Plan for TEE 3-22 rule out endocarditis. Patient  with history of Aortic bioprosthetic valve replacement,.  Blood culture 3-19 growing: strep B agalactiae.   Acute stroke, multiple infarcts;  Likely embolic, in setting of A fib but also Bacteremia. Hold on stating anticoagulation , need to rule out endocarditis.     Acute COPD exac - acute hypoxic resp failure  No infiltrate on CXR - slowly improving, but volume overload likely contributing to increased O2 requirement at this time - will likely require home O2 - check ambulatory sat after diuresis - Continue with Pulmicort, schedule nebulizer,  prednisone taper.   A fib w/ RVR Cardiology following - IV cardizem now transitioned to oral - TSH normal - high risk for anticoag given recent falls - Cards suggests ASA for now and reconsider anticaog in outpt f/u - no amio due to severe lung disease  Continue with digoxin, Bisoprolol. Verapamil  Discussed wi-th family 3-20, patient prior to admission was active. He fell prior to admission because he was sick. Prior to admission , he used to go to grocery store, restaurants with assistance of son. They understand risk for bleeding and with risk of falling. Patient will be discharge to SNF. They agree with coumadin. This will need to be reevaluate after TEE results. Needs to follow up Neurology recommendations.    Acute mental status change -Multifactorial to include SIRS, Ativan, steroids, acute on chronic renal failure, sundowning  -CT head unrevealing  -Worsening MS 3-19, MRI positive for stroke. Spike fever 3-19.   Dysphagia Cleared for D1 diet w/  thin liquids per SLP   DM2 - acutely severely uncontrolled  A1c 7.1 - required insulin gtt earlier in stay - CBG reasonably controlled - follow w/o change today  Lantus 20 units.   N/V - resolved  KUB noted gaseous distention of stomach on BIPAP - suspect bloating was mostly air blown in w/ BIPAP - simethicone - resolved   Constipation; had 2 BM overnight   Acute on CKD Stage 3 Baseline  creatinine 1.6 - creatinine at presentation 2.58 w/ peak of 3.12 - avoid ACE/ARB - crt now at ~baseline  [facility] Weights   01/06/16 0421 01/07/16 0255 01/08/16 0542  Weight: 93.622 kg (206 lb 6.4 oz) 90.901 kg (200 lb 6.4 oz) 91.309 kg (201 lb 4.8 oz)   Cr at 2.6. Holding  diuretics.   Acute Diastolic HF;  Good urine out put.  Cardiology managing  Holding torsemide due to increase cr.   Hyponatremia Appears to have been due to hypovolemia and hyperglycemia - resolved   Coronary artery disease status post CABG x3 2010 Cont ASA + lipid med  Aortic stenosis status post valve replacement 2010 Pericardial tissue valve stable on TTE   Carotid artery stenosis  Dopplers May 2015 showed a 60-79% right and 1-39% left stenosis  Hyperlipidemia Cont med tx  GERD  HTN Not an active issue at this time   Code Status: DO NOT RESUSCITATE Family Communication: daughter [id number] Disposition Plan: awaiting improvement of renal function, now with bacteremia, plan for TEE 3-21  Consultants: [facility] Cards   Procedures: 3/9 TTE - EF 60-65% - Aortic valve: A bioprosthesis was present and functioning normally - Mitral valve: severe stenosis. Mean gradient (D): 11 mm Hg - Right ventricle: size was normal. Wall thickness normal. Systolic function was normal - Tricuspid valve: trivial regurgitation.  Antibiotics: levaquin 3/7 > 3/13  DVT prophylaxis: Subcutaneous heparin  Objective: Blood pressure 98/46, pulse 88, temperature 98.3 F (36.8 C), temperature source Oral, resp. rate 18, height 5' 9"  (1.753 m), weight 91.309 kg (201 lb 4.8 oz), SpO2 90 %.  Intake/Output Summary (Last 24 hours) at 01/08/16 1338 Last data filed at 01/08/16 0900  Gross per 24 hour  Intake    210 ml  Output   1150 ml  Net   -940 ml   Filed Weights   01/06/16 0421 01/07/16 0255 01/08/16 0542  Weight: 93.622 kg (206 lb 6.4 oz) 90.901 kg (200 lb 6.4 oz) 91.309 kg (201 lb 4.8 oz)   Exam: General: more alert.  Lungs:  distant bs th/o - mild basilar crackles  Cardiovascular: reg rate at 90bpm - no signif M - no rub  Abdomen: NT/ND, soft, bs+, no mass, no rebound  Extremities: no significant cyanosis, or clubbing - 2+ edema bilateral lower extremities  Data Reviewed:  Basic Metabolic Panel:  Recent Labs Lab 01/04/16 0521 01/05/16 0352 01/06/16 0220 01/07/16 0214 01/08/16 0252  NA 140 139 136 139 140  K 3.5 4.1 4.1 3.8 3.7  CL 97* 96* 95* 98* 98*  CO2 31 29 29 29 30   GLUCOSE 279* 319* 324* 339* 307*  BUN 25* 33* 39* 47* 48*  CREATININE 2.09* 2.05* 2.33* 2.84* 2.62*  CALCIUM 8.1* 8.3* 8.1* 7.9* 7.7*    CBC:  Recent Labs Lab 01/05/16 0352 01/07/16 0214 01/08/16 0252  WBC 12.6* 11.5* 6.7  HGB 10.0* 9.3* 8.5*  HCT 31.6* 29.5* 27.5*  MCV 93.2 93.1 94.2  PLT 255 176 134*   Liver Function Tests:  Recent Labs Lab 01/06/16  1736  AST 22  ALT 19  ALKPHOS 48  BILITOT 1.2  PROT 5.6*  ALBUMIN 2.4*   CBG:  Recent Labs Lab 01/07/16 1147 01/07/16 1625 01/07/16 2102 01/08/16 0544 01/08/16 1143  GLUCAP 216* 232* 271* 280* 272*    Recent Results (from the past 240 hour(s))  Culture, blood (routine x 2)     Status: None (Preliminary result)   Collection Time: 01/06/16  5:19 PM  Result Value Ref Range Status   Specimen Description BLOOD RIGHT ARM  Final   Special Requests   Final    BOTTLES DRAWN AEROBIC AND ANAEROBIC 6CC BLUE 5CC PURPLE   Culture  Setup Time   Final    IN BOTH AEROBIC AND ANAEROBIC BOTTLES GRAM POSITIVE COCCI CRITICAL RESULT CALLED TO, READ BACK BY AND VERIFIED WITH: N.WILEY,RN 0602 01/07/16 M.CAMPBELL    Culture GROUP B STREP(S.AGALACTIAE)ISOLATED  Final   Report Status PENDING  Incomplete  Culture, blood (routine x 2)     Status: None (Preliminary result)   Collection Time: 01/06/16  5:28 PM  Result Value Ref Range Status   Specimen Description BLOOD LEFT ARM  Final   Special Requests BOTTLES DRAWN AEROBIC AND ANAEROBIC 10CC  Final   Culture  Setup Time    Final    IN BOTH AEROBIC AND ANAEROBIC BOTTLES GRAM POSITIVE COCCI IN CHAINS IN CLUSTERS CRITICAL RESULT CALLED TO, READ BACK BY AND VERIFIED WITH: G.RASUL,RN 0626 01/07/16 M.CAMPBELL    Culture   Final    GROUP B STREP(S.AGALACTIAE)ISOLATED SUSCEPTIBILITIES TO FOLLOW    Report Status PENDING  Incomplete  Urine culture     Status: None   Collection Time: 01/07/16 12:50 AM  Result Value Ref Range Status   Specimen Description URINE, RANDOM  Final   Special Requests NONE  Final   Culture MULTIPLE SPECIES PRESENT, SUGGEST RECOLLECTION  Final   Report Status 01/08/2016 FINAL  Final    Studies:   Recent x-ray studies have been reviewed in detail by the Attending Physician  Scheduled Meds:  Scheduled Meds: . antiseptic oral rinse  7 mL Mouth Rinse BID  . aspirin  324 mg Oral Daily  . atorvastatin  20 mg Oral q1800  . bisoprolol  2.5 mg Oral Daily  . budesonide (PULMICORT) nebulizer solution  0.25 mg Nebulization BID  . diltiazem  120 mg Oral 3 times per day  . heparin subcutaneous  5,000 Units Subcutaneous 3 times per day  . insulin aspart  0-20 Units Subcutaneous TID WC  . insulin aspart  0-5 Units Subcutaneous QHS  . insulin glargine  20 Units Subcutaneous Daily  . pantoprazole sodium  40 mg Oral BID  . piperacillin-tazobactam (ZOSYN)  IV  3.375 g Intravenous Q8H  . polyethylene glycol  17 g Oral BID  . predniSONE  30 mg Oral Q breakfast  . sodium chloride flush  3 mL Intravenous Q12H    Time spent on care of this patient: 35 mins   Elmarie Shiley , MD  [facility]  [phone] Pager - Text Page per Amion as per below:  On-Call/Text Page:      [email or web address]      password [email or web address]  If 7PM-7AM, please contact night-coverage [email or web address] Password [email or web address] 01/08/2016, 1:38 PM   LOS: 14 days

## 2016-01-08 NOTE — Progress Notes (Signed)
Physical Therapy Treatment Patient Details Name: Eric [name] MRN: [id number] DOB: Aug 18, 1927 Today's Date: 01/08/2016    History of Present Illness 82 yo presented with 2 day history of generalized weakness which resulted in patient falling out of bed 2 days ago. Developed hip pain from this fall which persisted. History of hip fracture and surgical repair. patient has had new onset wheezing and coughing with occasional phlegm production. pt presents with A-flutter, COPD Exacerbation, and N/V.  pt with hx of AVR, CABG, HTN, DM, Shingles, and L THR.  Pt with A-fib with RVR. COPD/Bronchitis.     PT Comments    Patient unable to stand this session without assist of lift.  Feel continued skilled PT in the acute setting will assist with progressing towards d.c. To SNF level rehab with decreased burden of care.  Follow Up Recommendations  SNF     Equipment Recommendations  None recommended by PT    Recommendations for Other Services       Precautions / Restrictions Precautions Precautions: Fall Precaution Comments: watch O2 Sats` Restrictions Weight Bearing Restrictions: No    Mobility  Bed Mobility Overal bed mobility:  (Pt sitting up in chair upon OT arrival)   Rolling: Mod assist Sidelying to sit: Mod assist       General bed mobility comments: cues for technique, assist to reach rail and to lift trunk upright after assisting legs off bed  Transfers Overall transfer level: Needs assistance Equipment used: Rolling walker (2 wheeled) Transfers: Sit to/from Stand Sit to Stand: Max assist;+2 physical assistance Stand pivot transfers: Total assist;+2 physical assistance       General transfer comment: attempted x 2 to stand from EOB, but pt unable to support weight through LE's.  Assisted pt up on third trial with Eric [name] Plus with success and able to pivot to chair  Ambulation/Gait                 Stairs            Wheelchair Mobility    Modified  Rankin (Stroke Patients Only)       Balance Overall balance assessment: Needs assistance Sitting-balance support: Feet supported;Bilateral upper extremity supported Sitting balance-Leahy Scale: Poor Sitting balance - Comments: needed min to max A for balance at EOB about 8 minutes due to LOB to R and at times forward or backwrar Postural control: Posterior lean;Right lateral lean                          Cognition Arousal/Alertness: Awake/alert Behavior During Therapy: WFL for tasks assessed/performed Overall Cognitive Status: No family/caregiver present to determine baseline cognitive functioning                      Exercises General Exercises - Upper Extremity Shoulder Flexion: PROM;AAROM;Supine;Right;10 reps  Elbow Flexion: AAROM;AROM;Supine;Right;10 reps  Elbow Extension: AROM;AAROM;Supine;Right;10 reps General Exercises - Lower Extremity Ankle Circles/Pumps: AROM;Both;5 reps;Supine Heel Slides: AAROM;Both;5 reps;Supine    General Comments        Pertinent Vitals/Pain Pain Assessment: No/denies pain Faces Pain Scale: Hurts little more Pain Location: generalized with mobility Pain Descriptors / Indicators: Sore Pain Intervention(s): Monitored during session;Repositioned    Home Living                      Prior Function            PT Goals (current goals can now be found  in the care plan section) Acute Rehab PT Goals Patient Stated Goal: Go home with my son Progress towards PT goals: Progressing toward goals    Frequency  Min 2X/week    PT Plan Current plan remains appropriate    Co-evaluation             End of Session Equipment Utilized During Treatment: Gait belt Activity Tolerance: Patient limited by fatigue Patient left: in chair;with call bell/phone within reach;with nursing/sitter in room;with chair alarm set     Time: [phone] PT Time Calculation (min) (ACUTE ONLY): 26 min  Charges:  $Therapeutic Exercise:  8-22 mins $Therapeutic Activity: 8-22 mins                    G Codes:      [name] February 04, 2016, 10:43 AM  [name], [name] 04-Feb-2016

## 2016-01-08 NOTE — Progress Notes (Signed)
Speech Language Pathology Treatment: Dysphagia  Patient Details Name: Eric [name] MRN: [id number] DOB: 08/18/1927 Today's Date: 01/08/2016 Time: [id number] SLP Time Calculation (min) (ACUTE ONLY): 23 min  Assessment / Plan / Recommendation Clinical Impression  Pt was seen for skilled ST targeting dysphagia goals.  Pt repositioned to more upright posture to facilitate safe swallowing prior to POs.  Pt alert and able to follow commands to assist in bed mobility.  SLP completed skilled observations during presentations of dys 1 textures and thin liquids via cup sips and straws.  Pt continues to demonstrate congested cough prior to consumption of POs, and inconsistent delayed cough with POs consistent with yesterday's bedside evaluation and previous objective study.  Pt remains afebrile, O2 sats ranging between 90 and 96 on room air.  Pt politely declined trials of dys 2 textures today and had overall limited intake of purees and liquids during treatment.  Recommend pt continue on dys 1 diet with thin liquids with known aspiration risk due to respiratory status.  Also recommend ongoing ST follow up for management of diet progression and toleration.   HPI HPI: Eric [name] is a 82 y.o. male with multiple medical problems who was admitted to the hospital on 3/7 with shortness of breath. He has a hx of aortic stenosis s/p bioprosthetic valve replacement and is on asa 81. Apparently after being here for over 10 days he started to have somewhat slurred speech and confusion since yesterday, which triggered the team to order a brain MRI that shows a few right sided embolic appearing strokes. Pt was seen by SLP prior to new CVA. At bedside pt was coughing with all textures; MBS on 3/313/17 showed oral dysphagia with piecemealing of solids, but no aspiration with any texture, even consecutive intake with thin liquids. Coughing was observed throughout assessment.       SLP Plan  Continue with current plan  of care     Recommendations  Diet recommendations: Dysphagia 1 (puree);Thin liquid Liquids provided via: Cup;Straw Medication Administration: Whole meds with puree Supervision: Staff to assist with self feeding Compensations: Slow rate;Small sips/bites Postural Changes and/or Swallow Maneuvers: Seated upright 90 degrees             Oral Care Recommendations: Oral care BID Follow up Recommendations: Skilled Nursing facility Plan: Continue with current plan of care     GO                Torsten Weniger, Selinda Orion 01/08/2016, 9:02 AM

## 2016-01-08 NOTE — Progress Notes (Addendum)
Subjective: No chest pain, no SOB, congested cough  Objective: Vital signs in last 24 hours: Temp:  [98.4 F (36.9 C)-98.9 F (37.2 C)] 98.4 F (36.9 C) (03/21 0542) Pulse Rate:  [87-91] 87 (03/21 0542) Resp:  [18-20] 18 (03/21 0542) BP: (92-103)/(50-60) 103/54 mmHg (03/21 0542) SpO2:  [90 %-96 %] 90 % (03/21 0741) Weight:  [201 lb 4.8 oz (91.309 kg)] 201 lb 4.8 oz (91.309 kg) (03/21 0542) Weight change: 14.4 oz (0.408 kg) Last BM Date: 01/06/16 Intake/Output from previous day: -680 03/20 0701 - 03/21 0700 In: 120 [P.O.:120] Out: 800 [Urine:800] Intake/Output this shift:    PE: General:Pleasant affect, NAD Skin:Warm and dry, brisk capillary refill HEENT:normocephalic, sclera clear, mucus membranes moist Heart:irreg irreg without murmur, gallup, rub or click Lungs:ant without rales, rhonchi, + wheezes [id number], non tender, + BS, do not palpate liver spleen or masses Ext:no lower ext edema, 2+ pedal pulses, 2+ radial pulses Neuro:alert and oriented X 3, MAE, follows commands Tele: a fib rate controlled   Lab Results:  Recent Labs  01/07/16 0214 01/08/16 0252  WBC 11.5* 6.7  HGB 9.3* 8.5*  HCT 29.5* 27.5*  PLT 176 134*   BMET  Recent Labs  01/07/16 0214 01/08/16 0252  NA 139 140  K 3.8 3.7  CL 98* 98*  CO2 29 30  GLUCOSE 339* 307*  BUN 47* 48*  CREATININE 2.84* 2.62*  CALCIUM 7.9* 7.7*   No results for input(s): TROPONINI in the last 72 hours.  Invalid input(s): CK, MB  Lab Results  Component Value Date   CHOL 107 01/07/2016   HDL 33* 01/07/2016   LDLCALC 52 01/07/2016   LDLDIRECT 51.0 03/05/2015   TRIG 112 01/07/2016   CHOLHDL 3.2 01/07/2016   Lab Results  Component Value Date   HGBA1C 7.1* 12/25/2015     Lab Results  Component Value Date   TSH 0.643 12/28/2015    Hepatic Function Panel  Recent Labs  01/06/16 1736  PROT 5.6*  ALBUMIN 2.4*  AST 22  ALT 19  ALKPHOS 48  BILITOT 1.2  BILIDIR 0.4  IBILI 0.8     Recent Labs  01/07/16 0214  CHOL 107   No results for input(s): PROTIME in the last 72 hours.     Studies/Results: Dg Chest 2 View  01/06/2016  CLINICAL DATA:  Fevers EXAM: CHEST  2 VIEW COMPARISON:  12/25/2015 FINDINGS: The cardiac shadow is within normal limits. Postsurgical changes are again noted. The lungs are well aerated bilaterally . Minimal increased density is noted in the posterior costophrenic angle on the lateral projection. No acute bony abnormality is seen. IMPRESSION: Mild posterior left lower lobe infiltrate. Electronically Signed   By: Inez Catalina M.D.   On: 01/06/2016 17:43   Mr Brain Wo Contrast  01/06/2016  CLINICAL DATA:  Slurred speech.  Weakness and fall. EXAM: MRI HEAD WITHOUT CONTRAST TECHNIQUE: Multiplanar, multiecho pulse sequences of the brain and surrounding structures were obtained without intravenous contrast. COMPARISON:  CT head 12/28/2015 FINDINGS: Multiple areas of restricted diffusion compatible with acute infarct. 5 x 10 mm acute infarct right lateral cerebellum. Subcentimeter acute infarct in the right caudate. Small areas of acute infarct in the right frontal parietal white matter and right parietal cortex. Probable emboli. Moderate to advanced atrophy. Mild chronic microvascular ischemic changes in the white matter. Chronic infarcts in the cerebellum bilaterally. Negative for intracranial hemorrhage.  Negative for mass or edema. Image quality degraded by moderate motion.  Pituitary not enlarged.  Paranasal sinuses clear. IMPRESSION: Multiple small areas of acute infarct including the right cerebellum, right basal ganglia, and right parietal lobe. Probable emboli. Atrophy and chronic microvascular ischemia Image quality degraded by motion Electronically Signed   By: Franchot Gallo M.D.   On: 01/06/2016 17:01    Medications: I have reviewed the patient's current medications. Scheduled Meds: . antiseptic oral rinse  7 mL Mouth Rinse BID  . aspirin  324  mg Oral Daily  . atorvastatin  20 mg Oral q1800  . bisoprolol  2.5 mg Oral Daily  . budesonide (PULMICORT) nebulizer solution  0.25 mg Nebulization BID  . diltiazem  120 mg Oral 3 times per day  . heparin subcutaneous  5,000 Units Subcutaneous 3 times per day  . insulin aspart  0-20 Units Subcutaneous TID WC  . insulin aspart  0-5 Units Subcutaneous QHS  . insulin glargine  20 Units Subcutaneous Daily  . pantoprazole sodium  40 mg Oral BID  . piperacillin-tazobactam (ZOSYN)  IV  3.375 g Intravenous Q8H  . polyethylene glycol  17 g Oral BID  . predniSONE  30 mg Oral Q breakfast  . sodium chloride flush  3 mL Intravenous Q12H  . Warfarin - Pharmacist Dosing Inpatient   Does not apply q1800   Continuous Infusions:  PRN Meds:.acetaminophen **OR** acetaminophen, feeding supplement (GLUCERNA SHAKE), hydrALAZINE, ipratropium, levalbuterol, ondansetron **OR** ondansetron (ZOFRAN) IV, RESOURCE THICKENUP CLEAR, simethicone  Assessment/Plan: Principal Problem:   Acute respiratory distress ([name] Active Problems:   HYPERCHOLESTEROLEMIA   Bronchitis   Diabetes mellitus with complication ([name]   S/P CABG (coronary artery bypass graft)   S/P AVR (aortic valve replacement)   Atrial fibrillation with RVR ([name]   Multifocal atrial tachycardia ([name]   Sepsis ([name]   COPD exacerbation ([name]   Metabolic acidosis   Dilated cardiomyopathy ([name]   Pulmonary hypertension ([name]   CAD in native artery   H/O aortic valve replacement   Uncontrolled type 2 diabetes mellitus with complication ([name]   Hyponatremia   Hypotension   Acute renal failure superimposed on stage 3 chronic kidney disease ([name]   Delirium   Acute renal failure ([name]   Cerebral thrombosis with cerebral infarction   Pressure ulcer  ATRIAL FIB WITH RVR:  Telemetry reviewed. Rate-controlled on cardizem and BB. Rate controlled. Coumadin starting today.  CAD: Medical management. On ASA, lipitor, BB, cardizem. No plans for  further ischemia work up.   AVR: Normal function on echo.   Volume Overload: combination AKI and treatment for SIRS/Sepsis, up +4L yesterday now +3277-improving Net fluids: -0.680L/+3.2L. Diuresis held due rising SCr which has improved slightly: 2.05>>2.33>>2.84>>2.62. He's had some hypotension which maybe contributing to rise in SCr. Plan today to continue medications for heart rate control. CXR yesterday: Mild posterior LLL infiltrate. The patient's creatinine continues to rise. He is receiving vancomycin. Diuretics had been held. Consideration was to be given to possibly resuming diuresis today?. The patient does have mild edema. However his chest x-ray recently did not show CHF and his input/output has not been significantly increased in the past day or 2. I am hesitant to resume his diuretic today as he has relative hypotension.   Acute CVA MRI: Multiple small areas of acute infarct including the right cerebellum, right basal ganglia, and right parietal lobe. Probable emboli. Neuro following. ASA increased to 325. Chronic Afib which may be the source. Previously not felt to be an anticoagulation candidate Per Dr. Ron Parker with new embolic stroke coumadin would  be prudent.  Dr. Tyrell Antonio discussed with family and they are in agreement with the coumadin. Beginning coumadin today.  Once therapeutic stop ASA.      [facility] has been requested to perform a transesophageal echocardiogram on 01/09/16 for Sepsis and AVR with pericardial valve.  After careful review of history and examination, the risks and benefits of transesophageal echocardiogram have been explained including risks of esophageal damage, perforation (1:10,000 risk), bleeding, pharyngeal hematoma as well as other potential complications associated with conscious sedation including aspiration, arrhythmia, respiratory failure and death. Alternatives to treatment were discussed, questions were answered. Patient is willing to  proceed.   Also discussed with Daughter Manon Hilding that has also agreed to proceed.    INGOLD,LAURA R,  01/08/2016 12:18 PM     LOS: 14 days   Time spent with pt. :15 minutes. [facility] R  Nurse Practitioner Certified Pager [phone] or after 5pm and on weekends call [phone] 01/08/2016, 9:23 AM   Patient seen and examined. I agree with the assessment and plan as detailed above. See also my additional thoughts below.   Further information is now available. The patient has positive blood culture. Neurology is requesting TEE. With this new finding, he will not be coumadinized at this time. He has received one dose.Dola Argyle, MD, [facility] 01/08/2016 11:10 AM

## 2016-01-08 NOTE — Progress Notes (Signed)
Inpatient Diabetes Program Recommendations  AACE/ADA: New Consensus Statement on Inpatient Glycemic Control (2015)  Target Ranges:  Prepandial:   less than 140 mg/dL      Peak postprandial:   less than 180 mg/dL (1-2 hours)      Critically ill patients:  140 - 180 mg/dL   Review of Glycemic Control:  Results for ZAVIAN, CHEERS (MRN [id number]) as of 01/08/2016 09:36  Ref. Range 01/07/2016 05:49 01/07/2016 11:47 01/07/2016 16:25 01/07/2016 21:02 01/08/2016 05:44  Glucose-Capillary Latest Ref Range: 65-99 mg/dL 306 (H) 216 (H) 232 (H) 271 (H) 280 (H)    Inpatient Diabetes Program Recommendations: Insulin - Basal: Please consider increasing Lantus to 30 units QHS. Insulin - Meal Coverage: Noted steroids were decreased today. While inpatient and ordered steroids, please consider ordering Novolog 4 units TID with meals for meal coverage.   Thanks, Adah Perl, RN, BC-ADM Inpatient Diabetes Coordinator Pager [phone] (8a-5p)

## 2016-01-08 NOTE — Progress Notes (Signed)
Occupational Therapy Treatment Patient Details Name: Eric [name] MRN: [id number] DOB: 1927-03-02 Today's Date: 01/08/2016    History of present illness 82 yo presented with 2 day history of generalized weakness which resulted in patient falling out of bed 2 days ago. Developed hip pain from this fall which persisted. History of hip fracture and surgical repair. patient has had new onset wheezing and coughing with occasional phlegm production. pt presents with A-flutter, COPD Exacerbation, and N/V.  pt with hx of AVR, CABG, HTN, DM, Shingles, and L THR.  Pt with A-fib with RVR. COPD/Bronchitis.    OT comments  Pt seen for ADL retraining session today. He declined weighted spoon despite maximal encouragement for increased participation with self feeding. He was agreeable to general exercises UE's with focus on increased AROM and functional use for increased participation in daily activities as strength improves.  Follow Up Recommendations       Equipment Recommendations       Recommendations for Other Services      Precautions / Restrictions Precautions Precautions: Fall Precaution Comments: watch O2 Sats` Restrictions Weight Bearing Restrictions: No       Mobility Bed Mobility Overal bed mobility:  (Pt sitting up in chair upon OT arrival)                Transfers                      Balance                                   ADL Overall ADL's : Needs assistance/impaired Eating/Feeding: Minimal assistance;Sitting Eating/Feeding Details (indicate cue type and reason): Tried weighted spoon to assist with independence and self feeding. Pt was able to hold weighted spoon, however stated that he wouldn't use it. "I have one at home and i can hardly lift it" Pt given encourangement to use daily and he stated "I won't, I will never use it"                                    General ADL Comments: Pt seen for DAL retraining session today  with focus on self feeding and weighted spoon use, however, pt declines any further use - "I won't use it, I have one at home and I don't use it" Pt is noted to be able to grip spoon with built up handle, however, he is currently unwilling to try and increase endurance for self feeding despite education re: weight decreasing tremor and will eventually increase strength over time. Pt was then shown yellow/light theraband and again declined to even try to use it "I've done that too, not worth it". Pt was agreeable to A/AROM bilateral UE's with focus on increase AROM RUE for increased participation in daily activities.       Vision                     Perception     Praxis      Cognition   Behavior During Therapy: [facility] for tasks assessed/performed Overall Cognitive Status: No family/caregiver present to determine baseline cognitive functioning                       Extremity/Trunk Assessment  Exercises General Exercises - Upper Extremity Shoulder Flexion: PROM;AAROM;Seated;Right;15 reps (Left AROM x10 reps) Shoulder ABduction: PROM;AAROM;Seated;Right;15 reps (Left AROM x10 reps) Shoulder Horizontal ABduction: PROM;AAROM;Seated;Right;15 reps (Left AROM x10 reps) Shoulder Horizontal ADduction: PROM;AAROM;Seated;Right;15 reps (Left AROM x10 reps) Elbow Flexion: AAROM;AROM;Seated;Right;15 reps (Left AROM x10 reps) Elbow Extension: AROM;AAROM;Seated;Right;15 reps (Left AROM x10 reps) Wrist Flexion: AAROM;Seated;Right;15 reps Wrist Extension: AAROM;Seated;Right;15 reps Digit Composite Flexion: AROM;AAROM;Seated;Right;15 reps   Shoulder Instructions       General Comments      Pertinent Vitals/ Pain       Pain Assessment: No/denies pain Faces Pain Scale: Hurts little more Pain Location: back Pain Descriptors / Indicators: Aching Pain Intervention(s): Repositioned  Home Living                                          Prior  Functioning/Environment              Frequency       Progress Toward Goals  OT Goals(current goals can now be found in the care plan section)  Progress towards OT goals: Progressing toward goals  Acute Rehab OT Goals Patient Stated Goal: Go home with my son  Plan Discharge plan remains appropriate    Co-evaluation                 End of Session Equipment Utilized During Treatment: Oxygen   Activity Tolerance Patient tolerated treatment well   Patient Left in chair;with call bell/phone within reach   Nurse Communication          Time: [id number] OT Time Calculation (min): 22 min  Charges: OT Treatments $Self Care/Home Management : 8-22 mins  [place], Amy Beth Dixon, OTR/L 01/08/2016, 10:15 AM

## 2016-01-08 NOTE — Progress Notes (Signed)
STROKE TEAM PROGRESS NOTE   HISTORY OF PRESENT ILLNESS Eric [name] is a 82 y.o. male with multiple medical problems who was admitted to the hospital on 3/7 with shortness of breath. He has a hx of aortic stenosis s/p bioprosthetic valve replacement and is on asa 81. Apparently after being here for over 10 days he started to have somewhat slurred speech and confusion since yesterday, which triggered the team to order a brain MRI that shows a few right sided embolic appearing strokes. Neurology was consulted earlier today for this reason and when I arrived at 7pm I was asked to see pt in consultation. Pt is somewhat confused and unable to provide further hx.   SUBJECTIVE (INTERVAL HISTORY) His RN is at the bedside.  Overall he feels his condition is gradually improving.     OBJECTIVE Temp:  [98.3 F (36.8 C)-98.5 F (36.9 C)] 98.3 F (36.8 C) (03/21 1216) Pulse Rate:  [87-91] 88 (03/21 1216) Cardiac Rhythm:  [-] Atrial fibrillation (03/21 0700) Resp:  [18-20] 18 (03/21 1216) BP: (98-103)/(46-60) 98/46 mmHg (03/21 1216) SpO2:  [90 %-96 %] 90 % (03/21 1216) Weight:  [201 lb 4.8 oz (91.309 kg)] 201 lb 4.8 oz (91.309 kg) (03/21 0542)  CBC:   Recent Labs Lab 01/07/16 0214 01/08/16 0252  WBC 11.5* 6.7  HGB 9.3* 8.5*  HCT 29.5* 27.5*  MCV 93.1 94.2  PLT 176 134*    Basic Metabolic Panel:   Recent Labs Lab 01/07/16 0214 01/08/16 0252  NA 139 140  K 3.8 3.7  CL 98* 98*  CO2 29 30  GLUCOSE 339* 307*  BUN 47* 48*  CREATININE 2.84* 2.62*  CALCIUM 7.9* 7.7*    Lipid Panel:     Component Value Date/Time   CHOL 107 01/07/2016 0214   TRIG 112 01/07/2016 0214   HDL 33* 01/07/2016 0214   CHOLHDL 3.2 01/07/2016 0214   VLDL 22 01/07/2016 0214   LDLCALC 52 01/07/2016 0214   HgbA1c:  Lab Results  Component Value Date   HGBA1C 7.1* 12/25/2015   Urine Drug Screen: No results found for: LABOPIA, COCAINSCRNUR, LABBENZ, AMPHETMU, THCU, LABBARB    IMAGING  Dg Chest 2  View 01/06/2016   Mild posterior left lower lobe infiltrate.   Mr Brain Wo Contrast 01/06/2016   Multiple small areas of acute infarct including the right cerebellum, right basal ganglia, and right parietal lobe. Probable emboli. Atrophy and chronic microvascular ischemia Image quality degraded by motion.   PHYSICAL EXAM Pleasant elderly male not in distress. . Afebrile. Head is nontraumatic. Neck is supple without bruit.    Cardiac exam no murmur or gallop. Lungs are clear to auscultation. Distal pulses are well felt. Neurological Exam ;  Awake  Alert oriented x 3. Mildly dysarthric speech but normal language.eye movements full without nystagmus.fundi were not visualized. Vision acuity and fields appear normal. Hearing is normal. Palatal movements are normal. Face symmetric. Tongue midline. Normal strength, tone, reflexes and coordination. Normal sensation. Gait deferred.     ASSESSMENT/PLAN [name] is a 82 y.o. male with history of cerebrovascular disease, coronary artery disease, hyperlipidemia, hypertension, diabetes mellitus, and aortic stenosis status post prosthetic valve replacement, presenting with slurred speech and confusion. He did not receive IV t-PA due to late presentation.  Stroke:  Non-dominant multiple small right-sided infarcts probably embolic from an unknown source.  Resultant   Gait ataxia  MRI  Multiple small areas of acute infarct including the right cerebellum, right basal ganglia, and  right parietal lobe.  MRA not performed  Carotid Doppler 1-39% ICA stenosis. Vertebral artery flow is antegrade.   2D Echo - EF 60-65%. No cardiac source of emboli identified.  LDL 52  HgbA1c 7.1  VTE prophylaxis - subcutaneous heparin DIET - DYS 1 Room service appropriate?: Yes; Fluid consistency:: Thin  aspirin 81 mg daily prior to admission, now on aspirin 325 mg daily  Patient counseled to be compliant with his antithrombotic medications  Ongoing  aggressive stroke risk factor management  Therapy recommendations: Pending  Disposition: Pending  Hypertension  Blood pressure tends to run low - on Cardizem and Zebeta  Permissive hypertension (OK if < 220/120) but gradually normalize in 5-7 days  Hyperlipidemia  Home meds: Zocor 40 mg daily - discontinued  LDL 52, goal < 70  Restart Zocor ?  Continue statin at discharge  Diabetes  HgbA1c 7.1, goal < 7.0  Controlled  Other Stroke Risk Factors  Advanced age  Cigarette smoker, quit smoking 22 years ago. Headvised to  Continue to stop smoking  ETOH use  Obesity, Body mass index is 29.71 kg/(m^2).   Coronary artery disease   Other Active Problems  Anemia  Kidney disease  Hospital day # 14  I have personally examined this patient, reviewed notes, independently viewed imaging studies, participated in medical decision making and plan of care. I have made any additions or clarifications directly to the above note. Agree with note above.D/w Dr Tyrell Antonio as patient has concern for endocarditis given sepsis and bioprosthetic valve will check TEE. No need for loop as AFIB already documented.aspirin for now and anticoagulation only after endocarditis ruled out Antony Contras, MD Medical Director [place] Pager: [phone] 01/08/2016 1:45 PM     To contact Stroke Continuity provider, please refer to [email or web address] After hours, contact General Neurology

## 2016-01-08 NOTE — Progress Notes (Signed)
Inpatient Diabetes Program Recommendations  AACE/ADA: New Consensus Statement on Inpatient Glycemic Control (2015)  Target Ranges:  Prepandial:   less than 140 mg/dL      Peak postprandial:   less than 180 mg/dL (1-2 hours)      Critically ill patients:  140 - 180 mg/dL   Review of Glycemic Control  Recommend increasing Lantus to home dose 28 units. Thank you  Raoul Pitch BSN, RN,CDE Inpatient Diabetes Coordinator [phone] (team pager)

## 2016-01-08 NOTE — Progress Notes (Signed)
Asked to evaluate patient for implantable loop recorder with recent stroke. As patient is in atrial fibrillation currently, no indication for LINQ.    Chanetta Marshall, NP 01/08/2016 1:21 PM

## 2016-01-09 ENCOUNTER — Encounter (HOSPITAL_COMMUNITY)
Admission: EM | Disposition: A | Discharge status: Skilled Nursing Facility | Payer: Self-pay | Source: Home / Self Care | Attending: Internal Medicine

## 2016-01-09 ENCOUNTER — Other Ambulatory Visit (HOSPITAL_COMMUNITY): Payer: Medicare Other

## 2016-01-09 ENCOUNTER — Inpatient Hospital Stay (HOSPITAL_COMMUNITY): Payer: Medicare Other

## 2016-01-09 ENCOUNTER — Encounter (HOSPITAL_COMMUNITY): Payer: Self-pay | Admitting: *Deleted

## 2016-01-09 DIAGNOSIS — E785 Hyperlipidemia, unspecified: Secondary | ICD-10-CM

## 2016-01-09 DIAGNOSIS — E1169 Type 2 diabetes mellitus with other specified complication: Secondary | ICD-10-CM | POA: Insufficient documentation

## 2016-01-09 DIAGNOSIS — E119 Type 2 diabetes mellitus without complications: Secondary | ICD-10-CM

## 2016-01-09 DIAGNOSIS — R4781 Slurred speech: Secondary | ICD-10-CM | POA: Insufficient documentation

## 2016-01-09 DIAGNOSIS — Z794 Long term (current) use of insulin: Secondary | ICD-10-CM

## 2016-01-09 HISTORY — PX: TEE WITHOUT CARDIOVERSION: SHX5443

## 2016-01-09 LAB — BASIC METABOLIC PANEL
Anion gap: 12 (ref 5–15)
BUN: 39 mg/dL — AB (ref 6–20)
CALCIUM: 8 mg/dL — AB (ref 8.9–10.3)
CHLORIDE: 99 mmol/L — AB (ref 101–111)
CO2: 29 mmol/L (ref 22–32)
CREATININE: 2.1 mg/dL — AB (ref 0.61–1.24)
GFR calc Af Amer: 31 mL/min — ABNORMAL LOW (ref >60)
GFR calc non Af Amer: 27 mL/min — ABNORMAL LOW (ref >60)
GLUCOSE: 214 mg/dL — AB (ref 65–99)
Potassium: 3.3 mmol/L — ABNORMAL LOW (ref 3.5–5.1)
Sodium: 140 mmol/L (ref 135–145)

## 2016-01-09 LAB — PROTIME-INR
INR: 1.24 (ref 0.00–1.49)
PROTHROMBIN TIME: 15.8 s — AB (ref 11.6–15.2)

## 2016-01-09 LAB — CULTURE, BLOOD (ROUTINE X 2)

## 2016-01-09 LAB — GLUCOSE, CAPILLARY
GLUCOSE-CAPILLARY: 173 mg/dL — AB (ref 65–99)
GLUCOSE-CAPILLARY: 215 mg/dL — AB (ref 65–99)
GLUCOSE-CAPILLARY: 253 mg/dL — AB (ref 65–99)
Glucose-Capillary: 377 mg/dL — ABNORMAL HIGH (ref 65–99)

## 2016-01-09 SURGERY — ECHOCARDIOGRAM, TRANSESOPHAGEAL
Anesthesia: Moderate Sedation

## 2016-01-09 MED ORDER — METHYLPREDNISOLONE SODIUM SUCC 125 MG IJ SOLR
60.0000 mg | INTRAMUSCULAR | Status: DC
  Administered 2016-01-09 – 2016-01-10 (×2): 60 mg via INTRAVENOUS
  Filled 2016-01-09 (×2): qty 2

## 2016-01-09 MED ORDER — LEVALBUTEROL HCL 0.63 MG/3ML IN NEBU
0.6300 mg | INHALATION_SOLUTION | Freq: Four times a day (QID) | RESPIRATORY_TRACT | Status: DC
  Administered 2016-01-09 – 2016-01-10 (×4): 0.63 mg via RESPIRATORY_TRACT
  Filled 2016-01-09 (×4): qty 3

## 2016-01-09 MED ORDER — FENTANYL CITRATE (PF) 100 MCG/2ML IJ SOLN
INTRAMUSCULAR | Status: AC
  Filled 2016-01-09: qty 2

## 2016-01-09 MED ORDER — IPRATROPIUM BROMIDE 0.02 % IN SOLN
0.5000 mg | Freq: Four times a day (QID) | RESPIRATORY_TRACT | Status: DC
  Administered 2016-01-09 – 2016-01-10 (×4): 0.5 mg via RESPIRATORY_TRACT
  Filled 2016-01-09 (×4): qty 2.5

## 2016-01-09 MED ORDER — GUAIFENESIN ER 600 MG PO TB12
600.0000 mg | ORAL_TABLET | Freq: Two times a day (BID) | ORAL | Status: DC
  Administered 2016-01-09 – 2016-01-17 (×16): 600 mg via ORAL
  Filled 2016-01-09 (×16): qty 1

## 2016-01-09 MED ORDER — GLUCERNA SHAKE PO LIQD
237.0000 mL | Freq: Three times a day (TID) | ORAL | Status: DC
  Administered 2016-01-09 – 2016-01-17 (×11): 237 mL via ORAL

## 2016-01-09 MED ORDER — MIDAZOLAM HCL 5 MG/ML IJ SOLN
INTRAMUSCULAR | Status: AC
  Filled 2016-01-09: qty 2

## 2016-01-09 MED ORDER — SODIUM CHLORIDE 0.9 % IV SOLN
INTRAVENOUS | Status: DC
  Administered 2016-01-09: 07:00:00 via INTRAVENOUS

## 2016-01-09 MED ORDER — MIDAZOLAM HCL 10 MG/2ML IJ SOLN
INTRAMUSCULAR | Status: DC | PRN
  Administered 2016-01-09: 1 mg via INTRAVENOUS

## 2016-01-09 NOTE — Progress Notes (Signed)
Hospital Problem List     Principal Problem:   Acute respiratory distress ([name] Active Problems:   HYPERCHOLESTEROLEMIA   Bronchitis   Diabetes mellitus with complication ([name]   S/P CABG (coronary artery bypass graft)   S/P AVR (aortic valve replacement)   Atrial fibrillation with RVR ([name]   Multifocal atrial tachycardia ([name]   Sepsis ([name]   COPD exacerbation ([name]   Metabolic acidosis   Dilated cardiomyopathy ([name]   Pulmonary hypertension ([name]   CAD in native artery   H/O aortic valve replacement   Uncontrolled type 2 diabetes mellitus with complication ([name]   Hyponatremia   Hypotension   Acute renal failure superimposed on stage 3 chronic kidney disease ([name]   Delirium   Acute renal failure ([name]   Cerebral thrombosis with cerebral infarction   Pressure ulcer    Patient Profile:   Primary Cardiologist: Dr. Stanford Breed  82 y.o. male w/ PMH of CAD (s/p CABG 2010), aortic stenosis (s/p AVR 2010), Type 2 DM, HTN, and HLD admitted on 12/25/2015 for worsening cough and wheezing. Met SIRS criteria, diagnosed with PNA, now positive for strep bacteremia. Cards consulted for new-onset atrial fibrillation w/ RVR.   Subjective   Says his breathing is "similar to yesterday". Denies any chest pain or palpitations. Scheduled to have TEE at 11:00.  Inpatient Medications    . ampicillin (OMNIPEN) IV  2 g Intravenous 6 times per day  . antiseptic oral rinse  7 mL Mouth Rinse BID  . aspirin  324 mg Oral Daily  . atorvastatin  20 mg Oral q1800  . bisoprolol  2.5 mg Oral Daily  . budesonide (PULMICORT) nebulizer solution  0.25 mg Nebulization BID  . diltiazem  120 mg Oral 3 times per day  . heparin subcutaneous  5,000 Units Subcutaneous 3 times per day  . insulin aspart  0-20 Units Subcutaneous TID WC  . insulin aspart  0-5 Units Subcutaneous QHS  . insulin glargine  20 Units Subcutaneous Daily  . pantoprazole sodium  40 mg Oral BID  . polyethylene glycol  17 g Oral BID    . predniSONE  30 mg Oral Q breakfast  . sodium chloride flush  3 mL Intravenous Q12H    Vital Signs    Filed Vitals:   01/08/16 1216 01/08/16 1956 01/08/16 2129 01/09/16 0542  BP: 98/46 102/46  131/61  Pulse: 88 76  86  Temp: 98.3 F (36.8 C) 98.4 F (36.9 C)  97.9 F (36.6 C)  TempSrc: Oral Oral  Oral  Resp: 18 20  18   Height:      Weight:    201 lb 8 oz (91.4 kg)  SpO2: 90% 100% 99% 95%    Intake/Output Summary (Last 24 hours) at 01/09/16 0856 Last data filed at 01/09/16 0809  Gross per 24 hour  Intake    640 ml  Output   1950 ml  Net  -1310 ml   Filed Weights   01/07/16 0255 01/08/16 0542 01/09/16 0542  Weight: 200 lb 6.4 oz (90.901 kg) 201 lb 4.8 oz (91.309 kg) 201 lb 8 oz (91.4 kg)    Physical Exam    General: Well developed, well nourished, male appearing in no acute distress. Head: Normocephalic, atraumatic.  Neck: Supple without bruits, JVD not elevated. Lungs:  Resp regular and unlabored, expiratory wheezing noted throughout upper lung fields. Heart: Irregularly irregular, S1, S2, no S3, S4, or murmur; no rub. Abdomen: Soft, non-tender, non-distended with normoactive bowel sounds. No  hepatomegaly. No rebound/guarding. No obvious abdominal masses. Extremities: No clubbing, cyanosis, or edema. Distal pedal pulses are 2+ bilaterally. Neuro: Alert and oriented X 3. Moves all extremities spontaneously. Psych: Normal affect.  Labs    CBC  Recent Labs  01/07/16 0214 01/08/16 0252  WBC 11.5* 6.7  HGB 9.3* 8.5*  HCT 29.5* 27.5*  MCV 93.1 94.2  PLT 176 174*   Basic Metabolic Panel  Recent Labs  01/08/16 0252 01/09/16 0608  NA 140 140  K 3.7 3.3*  CL 98* 99*  CO2 30 29  GLUCOSE 307* 214*  BUN 48* 39*  CREATININE 2.62* 2.10*  CALCIUM 7.7* 8.0*   Liver Function Tests  Recent Labs  01/06/16 1736  AST 22  ALT 19  ALKPHOS 48  BILITOT 1.2  PROT 5.6*  ALBUMIN 2.4*   No results for input(s): LIPASE, AMYLASE in the last 72 hours. Cardiac  Enzymes No results for input(s): CKTOTAL, CKMB, CKMBINDEX, TROPONINI in the last 72 hours. BNP Invalid input(s): POCBNP D-Dimer No results for input(s): DDIMER in the last 72 hours. Hemoglobin A1C No results for input(s): HGBA1C in the last 72 hours. Fasting Lipid Panel  Recent Labs  01/07/16 0214  CHOL 107  HDL 33*  LDLCALC 52  TRIG 112  CHOLHDL 3.2     Telemetry    Atrial fibrillation, rate-controlled in the 70's - 80's. 1.5 second pause noted this AM.  ECG    No new tracings.   Cardiac Studies and Radiology    Dg Chest 2 View: 01/06/2016  CLINICAL DATA:  Fevers EXAM: CHEST  2 VIEW COMPARISON:  12/25/2015 FINDINGS: The cardiac shadow is within normal limits. Postsurgical changes are again noted. The lungs are well aerated bilaterally . Minimal increased density is noted in the posterior costophrenic angle on the lateral projection. No acute bony abnormality is seen. IMPRESSION: Mild posterior left lower lobe infiltrate. Electronically Signed   By: Inez Catalina M.D.   On: 01/06/2016 17:43    Mr Brain Wo Contrast: 01/06/2016  CLINICAL DATA:  Slurred speech.  Weakness and fall. EXAM: MRI HEAD WITHOUT CONTRAST TECHNIQUE: Multiplanar, multiecho pulse sequences of the brain and surrounding structures were obtained without intravenous contrast. COMPARISON:  CT head 12/28/2015 FINDINGS: Multiple areas of restricted diffusion compatible with acute infarct. 5 x 10 mm acute infarct right lateral cerebellum. Subcentimeter acute infarct in the right caudate. Small areas of acute infarct in the right frontal parietal white matter and right parietal cortex. Probable emboli. Moderate to advanced atrophy. Mild chronic microvascular ischemic changes in the white matter. Chronic infarcts in the cerebellum bilaterally. Negative for intracranial hemorrhage.  Negative for mass or edema. Image quality degraded by moderate motion. Pituitary not enlarged.  Paranasal sinuses clear. IMPRESSION: Multiple small  areas of acute infarct including the right cerebellum, right basal ganglia, and right parietal lobe. Probable emboli. Atrophy and chronic microvascular ischemia Image quality degraded by motion Electronically Signed   By: Franchot Gallo M.D.   On: 01/06/2016 17:01    Echocardiogram: 12/27/2015 Study Conclusions  - Left ventricle: The cavity size was normal. Systolic function was  normal. The estimated ejection fraction was in the range of 60%  to 65%. Wall motion was normal; there were no regional wall  motion abnormalities. The study is not technically sufficient to  allow evaluation of LV diastolic function. - Aortic valve: A bioprosthesis was present and functioning  normally. - Mitral valve: Severely calcified annulus. The findings are  consistent with severe stenosis. Mean gradient (  D): 11 mm Hg. - Right ventricle: The cavity size was normal. Wall thickness was  normal. Systolic function was normal. - Tricuspid valve: There was trivial regurgitation.  Impressions: - Poor sound transmission prohibits full assessment of the aortic  valve. Bioprosthetic valve appears to be functioning properly  without significant stenosis or regurgitation.   Echocardiogram: 01/03/2016 Study Conclusions - HPI and indications: Limited study to re-assess mitral valve  gradients. - Procedure narrative: Transthoracic echocardiography. Image  quality was adequate. The study was technically difficult. - Left ventricle: The cavity size was normal. Wall thickness was  increased in a pattern of mild LVH. Systolic function was normal.  The estimated ejection fraction was in the range of 60% to 65%.  Wall motion was normal; there were no regional wall motion  abnormalities. - Mitral valve: Heavy posterior MAC. Mild mitral stenosis. Trivial  regurgitation. Pressure half-time: 89 ms. Mean gradient (D): 5 mm  Hg. Valve area by pressure half-time: 2.34 cm^2.  Impressions: - Compared to a  prior study on 12/27/15, there is only mild mitral  stenosis. This was reported as severe, however, I suspect a jet  sampling error. There is heavy posterior MAC with decreased  posterior leaflet excursion, however, the anterior leaflet opens  well.  Assessment & Plan    1. New onset Atrial Fibrillation w/ RVR - diagnosed this admission. - This patients CHA2DS2-VASc Score and unadjusted Ischemic Stroke Rate (% per year) is equal to 11.2 % stroke rate/year from a score of 7 (HTN, DM, Vascular, Age (2), Stroke (2)).  Coumadin is to be considered after the TEE is completed. Also, his mental status will have to be followed. - currently on Cardizem 149m Q8H and Bisoprolol 2.574mdaily with adequate rate-control at this time.  2. CAD - history of CABG in 2010 - continue ASA, Statin, and BB. Would continue with Atorvastatin at time of discharge due to interaction of Cardizem and PTA Simvastatin.  3. Aortic Stenosis - s/p AVR in 2010. - functioning normally on recent echocardiogram  4. Acute CVA - MRI on 01/06/2016 noted multiple small areas of acute infarct including the right cerebellum, right basal ganglia, and right parietal lobe. Probable emboli. - recommend anticoagulation in regards to his atrial fibrillation with new embolic CVA. Would start following his TEE, if normal. - Neurology following.  5. Strep Bacteremia - for TEE today to rule out endocarditis.  6. AKI - creatinine improved to 2.10 on 01/09/2016.   7. Volume Overload - occurring in the setting of AKI and treatment for Sepsis.  - improved, and is now + 1.9L this admission. Does not appear overly volume overloaded on physical exam.   Signed, BrErma Heritage PA-C 8:56 AM 01/09/2016 Pager: [phone]

## 2016-01-09 NOTE — Interval H&P Note (Signed)
History and Physical Interval Note:  01/09/2016 10:05 AM  Donna Christen  has presented today for surgery, with the diagnosis of STROKE  The various methods of treatment have been discussed with the patient and family. After consideration of risks, benefits and other options for treatment, the patient has consented to  Procedure(s): TRANSESOPHAGEAL ECHOCARDIOGRAM (TEE) (N/A) as a surgical intervention .  The patient's history has been reviewed, patient examined, no change in status, stable for surgery.  I have reviewed the patient's chart and labs.  Questions were answered to the patient's satisfaction.     Dorothy Spark

## 2016-01-09 NOTE — H&P (View-Only) (Signed)
Asked to evaluate patient for implantable loop recorder with recent stroke. As patient is in atrial fibrillation currently, no indication for LINQ.    Chanetta Marshall, NP 01/08/2016 1:21 PM

## 2016-01-09 NOTE — Progress Notes (Addendum)
PROGRESS NOTE  Eric [name] BJS:[id number] DOB: 1927-01-23 DOA: 12/25/2015 PCP: [name], MD  Admit HPI / Brief Narrative: 82 y.o. male who c/o a 2 day history of generalized weakness which lead to him falling out of bed 2 days prior. He developed hip pain from this fall. He also noted the new onset wheezing and coughing with occasional phlegm production, and a runny nose w/ upper respiratory congestion. He also noted a very poor appetite with very little oral intake.   He came to the ER and was found to be tachycardic (had P waves - was irregular - possible MAT).  He was treated with nebs with improvement of resp failure and tightness. His HR continued to climb to 135. He was given Cardizem 20 mg with BP dropping after. He was given a fluid bolus, and a dilt drip at 5 mg was begun.   Patient admitted with encephalopathy, SIRS, respiratory failure thought to be secondary to Heart failure exacerbation and presume COPD exacerbation. He was found to be in A fib RVR. He received Treatment for HF, and COPD exacerbation. His Mental status improved until 3-19 when he spike fever, and was notice to be disarthric by family. He underwent MRI which was positive for multiple acute stroke. Also blood culture are positive for Strep B agalactiae. Chest x ray positive for PNA>   Plan is to proceed for TEE 3-22. ID will see patient in consultation. Anticoagulation will need to be revisited after TEE results. Follow neurology recommendation regarding anticoagulation.   HPI/Subjective: Wheezing, desats with sedation, TEE cancelled, patient is currently pleasant and aaox3, daughter in room  Assessment/Plan: SIRS, Bacteremia, PNA: Strep Bacteremia, Agalactiae.  On admission patient met guidelines for SIRS w/ HR> 90, RR> 20, WBC> 12 - no infiltrate on CXR - no + culture results - UA not c/w UTI . LFT normal  Spike fever 3-19, Chest X ray: mild posterior left lower infiltrates.  abx history since  admission: levaquin 3/7 > 3/15 vanc on 3/19 and 20 Zosyn 3/19-3/21 ampicillin from 3/21  Planned for TEE 3-22 cancelled due to hypoxia with sedation, need to reschedule once respiratory status improves to rule out endocarditis. Patient with history of Aortic bioprosthetic valve replacement, Blood culture 3-19 growing: strep B agalactiae. Repeat blood culture from 3/21 pending, appreciate ID input  Acute stroke, multiple infarcts;  Likely embolic, in setting of A fib but also Bacteremia. Per nueorlogy anticoagulation only after endocarditis ruled out.  Currently on asa 361m qd, Appreciate neurology input   Acute COPD exac - acute hypoxic resp failure  Wheezing on 3/22, repeat cxr, increase steroids,  schedule nebulizer, add mucinex,  A fib w/ RVR , CAHADSvasc score 7 ( age/htn/chf/dm2/cva) Cardiology following - IV cardizem now transitioned to oral - TSH normal - high risk for anticoag given recent falls - Cards suggests ASA 325 for now and reconsider anticaog in outpt f/u - no amio due to severe lung disease  Continue with Bisoprolol. cardizem , she was on dig briefly, this was stopped on 3/18 by cardiology. Discussed wi-th family 3-20, patient prior to admission was active. He fell prior to admission because he was sick. Prior to admission , he used to go to grocery store, restaurants with assistance of son. They understand risk for bleeding and with risk of falling. Patient will be discharge to SNF. They agree with coumadin. This will need to be reevaluate after TEE results. Needs to follow up Neurology recommendations.    Acute mental  status change -Multifactorial to include SIRS, Ativan, steroids, acute on chronic renal failure, sundowning  -CT head unrevealing initially  -Worsening MS 3-19, MRI positive for stroke. Spike fever 3-19.   Dysphagia Cleared for D1 diet w/ thin liquids per SLP , continue aspiration precaution. Patient want to eat, report tired of eating puree diet.  Nutrition consulted, continue speech therapy  Insulin dependent DM2 - acutely severely uncontrolled  A1c 7.1 - required insulin gtt earlier in stay - CBG reasonably controlled - follow w/o change today  Lantus 20 units.   N/V - resolved  KUB noted gaseous distention of stomach on BIPAP - suspect bloating was mostly air blown in w/ BIPAP - simethicone - resolved   Constipation; better  Acute on CKD Stage 3 Baseline creatinine 1.6 - creatinine at presentation 2.58 w/ peak of 3.12 - avoid ACE/ARB - crt now at ~baseline  [facility] Weights   01/07/16 0255 01/08/16 0542 01/09/16 0542  Weight: 90.901 kg (200 lb 6.4 oz) 91.309 kg (201 lb 4.8 oz) 91.4 kg (201 lb 8 oz)   Cr at 2.6. Holding  diuretics.   Acute Diastolic HF;  Good urine out put.  Cardiology managing  Holding torsemide due to increase cr.   Hyponatremia Appears to have been due to hypovolemia and hyperglycemia - resolved   Coronary artery disease status post CABG x3 2010 Cont ASA + lipid med  Aortic stenosis status post valve replacement 2010 Pericardial tissue valve stable on TTE   Carotid artery stenosis  Dopplers May 2015 showed a 60-79% right and 1-39% left stenosis  Hyperlipidemia Cont med tx  GERD  HTN Not an active issue at this time   Code Status: DO NOT RESUSCITATE Family Communication: daughter at bedside Disposition Plan: pending, need to treat copd, then TEE  Consultants: [facility] Cards  Neurology ID  Procedures: 3/9 TTE - EF 60-65% - Aortic valve: A bioprosthesis was present and functioning normally - Mitral valve: severe stenosis. Mean gradient (D): 11 mm Hg - Right ventricle: size was normal. Wall thickness normal. Systolic function was normal - Tricuspid valve: trivial regurgitation.  TEE pending  Antibiotics: levaquin 3/7 > 3/15 vanc on 3/19 and 20 Zosyn 3/19-3/21 ampicillin from 3/21  DVT prophylaxis: Subcutaneous heparin  Objective: Blood pressure 104/59, pulse 77, temperature 97.7  F (36.5 C), temperature source Oral, resp. rate 28, height 5' 9"  (1.753 m), weight 91.4 kg (201 lb 8 oz), SpO2 94 %.  Intake/Output Summary (Last 24 hours) at 01/09/16 1109 Last data filed at 01/09/16 0809  Gross per 24 hour  Intake    540 ml  Output   1350 ml  Net   -810 ml   Filed Weights   01/07/16 0255 01/08/16 0542 01/09/16 0542  Weight: 90.901 kg (200 lb 6.4 oz) 91.309 kg (201 lb 4.8 oz) 91.4 kg (201 lb 8 oz)   Exam: General: pleasant , aaox3.  Lungs: bilateral wheezing  Cardiovascular: IRRR Abdomen: NT/ND, soft, bs+, no mass, no rebound  Extremities: no significant cyanosis, or clubbing - 2+ edema bilateral lower extremities documented before has much improved, only trace edema on 3/22 Neuro: aaox3, pleasant, no focal weakness, ? Slurred speech  Data Reviewed:  Basic Metabolic Panel:  Recent Labs Lab 01/05/16 0352 01/06/16 0220 01/07/16 0214 01/08/16 0252 01/09/16 0608  NA 139 136 139 140 140  K 4.1 4.1 3.8 3.7 3.3*  CL 96* 95* 98* 98* 99*  CO2 29 29 29 30 29   GLUCOSE 319* 324* 339* 307* 214*  BUN 33* 39* 47* 48* 39*  CREATININE 2.05* 2.33* 2.84* 2.62* 2.10*  CALCIUM 8.3* 8.1* 7.9* 7.7* 8.0*    CBC:  Recent Labs Lab 01/05/16 0352 01/07/16 0214 01/08/16 0252  WBC 12.6* 11.5* 6.7  HGB 10.0* 9.3* 8.5*  HCT 31.6* 29.5* 27.5*  MCV 93.2 93.1 94.2  PLT 255 176 134*   Liver Function Tests:  Recent Labs Lab 01/06/16 1736  AST 22  ALT 19  ALKPHOS 48  BILITOT 1.2  PROT 5.6*  ALBUMIN 2.4*   CBG:  Recent Labs Lab 01/08/16 0544 01/08/16 1143 01/08/16 1627 01/08/16 2044 01/09/16 0633  GLUCAP 280* 272* 148* 252* 173*    Recent Results (from the past 240 hour(s))  Culture, blood (routine x 2)     Status: None (Preliminary result)   Collection Time: 01/06/16  5:19 PM  Result Value Ref Range Status   Specimen Description BLOOD RIGHT ARM  Final   Special Requests   Final    BOTTLES DRAWN AEROBIC AND ANAEROBIC 6CC BLUE 5CC PURPLE   Culture   Setup Time   Final    IN BOTH AEROBIC AND ANAEROBIC BOTTLES GRAM POSITIVE COCCI CRITICAL RESULT CALLED TO, READ BACK BY AND VERIFIED WITH: N.WILEY,RN [id number] 01/07/16 M.CAMPBELL    Culture GROUP B STREP(S.AGALACTIAE)ISOLATED  Final   Report Status PENDING  Incomplete  Culture, blood (routine x 2)     Status: None (Preliminary result)   Collection Time: 01/06/16  5:28 PM  Result Value Ref Range Status   Specimen Description BLOOD LEFT ARM  Final   Special Requests BOTTLES DRAWN AEROBIC AND ANAEROBIC 10CC  Final   Culture  Setup Time   Final    IN BOTH AEROBIC AND ANAEROBIC BOTTLES GRAM POSITIVE COCCI IN CHAINS IN CLUSTERS CRITICAL RESULT CALLED TO, READ BACK BY AND VERIFIED WITH: G.RASUL,RN [id number] 01/07/16 M.CAMPBELL    Culture   Final    GROUP B STREP(S.AGALACTIAE)ISOLATED SUSCEPTIBILITIES TO FOLLOW    Report Status PENDING  Incomplete  Urine culture     Status: None   Collection Time: 01/07/16 12:50 AM  Result Value Ref Range Status   Specimen Description URINE, RANDOM  Final   Special Requests NONE  Final   Culture MULTIPLE SPECIES PRESENT, SUGGEST RECOLLECTION  Final   Report Status 01/08/2016 FINAL  Final  Culture, blood (routine x 2)     Status: None (Preliminary result)   Collection Time: 01/08/16  3:27 PM  Result Value Ref Range Status   Specimen Description BLOOD LEFT ANTECUBITAL  Final   Special Requests BOTTLES DRAWN AEROBIC AND ANAEROBIC 5CC EACH  Final   Culture PENDING  Incomplete   Report Status PENDING  Incomplete  Culture, blood (routine x 2)     Status: None (Preliminary result)   Collection Time: 01/08/16  3:33 PM  Result Value Ref Range Status   Specimen Description BLOOD RIGHT HAND  Final   Special Requests BOTTLES DRAWN AEROBIC ONLY 5CC  Final   Culture PENDING  Incomplete   Report Status PENDING  Incomplete    Studies:   Recent x-ray studies have been reviewed in detail by the Attending Physician  Scheduled Meds:  Scheduled Meds: . ampicillin  (OMNIPEN) IV  2 g Intravenous 6 times per day  . antiseptic oral rinse  7 mL Mouth Rinse BID  . aspirin  324 mg Oral Daily  . atorvastatin  20 mg Oral q1800  . bisoprolol  2.5 mg Oral Daily  . budesonide (PULMICORT) nebulizer solution  0.25 mg Nebulization BID  . diltiazem  120 mg Oral 3 times per day  . heparin subcutaneous  5,000 Units Subcutaneous 3 times per day  . insulin aspart  0-20 Units Subcutaneous TID WC  . insulin aspart  0-5 Units Subcutaneous QHS  . insulin glargine  20 Units Subcutaneous Daily  . pantoprazole sodium  40 mg Oral BID  . polyethylene glycol  17 g Oral BID  . predniSONE  30 mg Oral Q breakfast  . sodium chloride flush  3 mL Intravenous Q12H    Time spent on care of this patient: 59 mins   Amesha Bailey , MD PhD [phone]  [facility] Office  [phone] Pager - Text Page per Amion as per below:  On-Call/Text Page:      [email or web address]      password [email or web address]  If 7PM-7AM, please contact night-coverage [email or web address] Password [email or web address] 01/09/2016, 11:09 AM   LOS: 15 days

## 2016-01-09 NOTE — Progress Notes (Signed)
Occupational Therapy Treatment Patient Details Name: Eric [name] MRN: [id number] DOB: 20-Nov-1926 Today's Date: 01/09/2016    History of present illness 82 yo presented with 2 day history of generalized weakness which resulted in patient falling out of bed 2 days ago. Developed hip pain from this fall which persisted. History of hip fracture and surgical repair. patient has had new onset wheezing and coughing with occasional phlegm production. pt presents with A-flutter, COPD Exacerbation, and N/V.  pt with hx of AVR, CABG, HTN, DM, Shingles, and L THR.  Pt with A-fib with RVR. COPD/Bronchitis.    OT comments  Pt agreeable to working with OT.  Pt needs extensive assistance with adls from bed level.  He would benefit from SNF if agreeable  Follow Up Recommendations  SNF (if he refuses and goes home, then 24/7 and [place])    Equipment Recommendations  If home, Hospital bed possibly lift/BSC)    Recommendations for Other Services      Precautions / Restrictions Precautions Precautions: Fall Precaution Comments: watch O2 Sats` Restrictions Weight Bearing Restrictions: No       Mobility Bed Mobility     Rolling: Min assist Sidelying to sit: Mod assist          Transfers                      Balance   Sitting-balance support: Bilateral upper extremity supported;Feet supported Sitting balance-Leahy Scale: Fair Sitting balance - Comments: after first minute; initially poor                           ADL                               Toileting- Clothing Manipulation and Hygiene: Total assistance         General ADL Comments: pt incontinent x bowels. Rolled 4xs to clean up:  pt rolled with rails with min A.  Agreeable to sitting EOB (mod  A.  Initially dizzy, but it resolved.  Pt was initially agreeable to exercises at EOB but decided he just wanted to sit there (for about 10 minutes).  Assisted to pull himself up in bed using bedrails.        Vision                     Perception     Praxis      Cognition   Behavior During Therapy: WFL for tasks assessed/performed Overall Cognitive Status: No family/caregiver present to determine baseline cognitive functioning                       Extremity/Trunk Assessment               Exercises     Shoulder Instructions       General Comments      Pertinent Vitals/ Pain       Pain Assessment: No/denies pain  Home Living                                          Prior Functioning/Environment              Frequency Min 2X/week     Progress Toward Goals  OT Goals(current  goals can now be found in the care plan section)  Progress towards OT goals: Progressing toward goals     Plan      Co-evaluation                 End of Session     Activity Tolerance Patient tolerated treatment well   Patient Left in bed;with call bell/phone within reach;with nursing/sitter in room   Nurse Communication          Time: [id number] OT Time Calculation (min): 35 min  Charges: OT General Charges $OT Visit: 1 Procedure OT Treatments $Therapeutic Activity: 23-37 mins  Poetry Cerro 01/09/2016, 4:40 PM  Lesle Chris, OTR/L [phone] 01/09/2016

## 2016-01-09 NOTE — CV Procedure (Addendum)
     Transesophageal Echocardiogram Note  Eric [name] OW:[id number] 1926/12/15  Procedure: Transesophageal Echocardiogram Indications: Sepsis  Procedure Details Consent: Obtained Time Out: Verified patient identification, verified procedure, site/side was marked, verified correct patient position, special equipment/implants available, Radiology Safety Procedures followed,  medications/allergies/relevent history reviewed, required imaging and test results available.  Performed  Medications for sedation: Fentanyl: none Versed: 1 mg   Total sedation time: 15 minutes. I was face to face with the patient during that time.  The patient oriented to himself only when brought to the room - baseline SpO2 92% and  The patient is wheezing, coughing. After administration of just 1 mg of Versed his SpO2 dropped to 88-89%. We attempted to insert a TEE but the patient was awake, very uncomfortable with insertion and pulling out the probe.  We felt that it is unsafe to use more sedation considering low SpO2. Therefore we terminated the procedure and would like to reschedule with an anesthesia support once the patient more stable from respiratory standpoint.   Complications: Complications of low O2 saturations.  Patient did not tolerate procedure well.  Dorothy Spark, MD, [facility] 01/09/2016, 10:34 AM

## 2016-01-09 NOTE — Progress Notes (Signed)
STROKE TEAM PROGRESS NOTE   HISTORY OF PRESENT ILLNESS Eric [name] is a 82 y.o. male with multiple medical problems who was admitted to the hospital on 3/7 with shortness of breath. He has a hx of aortic stenosis s/p bioprosthetic valve replacement and is on asa 81. Apparently after being here for over 10 days he started to have somewhat slurred speech and confusion since yesterday, which triggered the team to order a brain MRI that shows a few right sided embolic appearing strokes. Neurology was consulted earlier today for this reason and when I arrived at 7pm I was asked to see pt in consultation. Pt is somewhat confused and unable to provide further hx.   SUBJECTIVE (INTERVAL HISTORY) Patient is currently in TEE endovascular lab and procedure is about to be performed hence exam was limited Overall he feels his condition is  unchanged     OBJECTIVE Temp:  [97.7 F (36.5 C)-98.4 F (36.9 C)] 97.7 F (36.5 C) (03/22 0943) Pulse Rate:  [74-87] 77 (03/22 1037) Cardiac Rhythm:  [-] Atrial fibrillation (03/21 1900) Resp:  [18-29] 28 (03/22 1037) BP: (102-131)/(46-64) 104/59 mmHg (03/22 1037) SpO2:  [89 %-100 %] 91 % (03/22 1500) Weight:  [201 lb 8 oz (91.4 kg)] 201 lb 8 oz (91.4 kg) (03/22 0542)  CBC:   Recent Labs Lab 01/07/16 0214 01/08/16 0252  WBC 11.5* 6.7  HGB 9.3* 8.5*  HCT 29.5* 27.5*  MCV 93.1 94.2  PLT 176 134*    Basic Metabolic Panel:   Recent Labs Lab 01/08/16 0252 01/09/16 0608  NA 140 140  K 3.7 3.3*  CL 98* 99*  CO2 30 29  GLUCOSE 307* 214*  BUN 48* 39*  CREATININE 2.62* 2.10*  CALCIUM 7.7* 8.0*    Lipid Panel:     Component Value Date/Time   CHOL 107 01/07/2016 0214   TRIG 112 01/07/2016 0214   HDL 33* 01/07/2016 0214   CHOLHDL 3.2 01/07/2016 0214   VLDL 22 01/07/2016 0214   LDLCALC 52 01/07/2016 0214   HgbA1c:  Lab Results  Component Value Date   HGBA1C 7.1* 12/25/2015   Urine Drug Screen: No results found for: LABOPIA,  COCAINSCRNUR, LABBENZ, AMPHETMU, THCU, LABBARB    IMAGING  Dg Chest 2 View 01/06/2016   Mild posterior left lower lobe infiltrate.   Mr Brain Wo Contrast 01/06/2016   Multiple small areas of acute infarct including the right cerebellum, right basal ganglia, and right parietal lobe. Probable emboli. Atrophy and chronic microvascular ischemia Image quality degraded by motion.   PHYSICAL EXAM Pleasant elderly male not in distress. . Afebrile. Head is nontraumatic. Neck is supple without bruit.    Cardiac exam no murmur or gallop. Lungs are clear to auscultation. Distal pulses are well felt. Neurological Exam ;  Awake  Alert oriented Mildly dysarthric speech but normal language.eye movements full without nystagmus.fundi were not visualized. Vision acuity and fields appear normal. Hearing is normal. Palatal movements are normal. Face symmetric. Tongue midline. Normal strength, tone, reflexes and coordination. Normal sensation. Gait deferred.     ASSESSMENT/PLAN Mr. ELRICO GERRICK is a 82 y.o. male with history of cerebrovascular disease, coronary artery disease, hyperlipidemia, hypertension, diabetes mellitus, and aortic stenosis status post prosthetic valve replacement, presenting with slurred speech and confusion. He did not receive IV t-PA due to late presentation.  Stroke:  Non-dominant multiple small right-sided infarcts probably embolic from an unknown source.  Resultant   Gait ataxia  MRI  Multiple small areas of acute  infarct including the right cerebellum, right basal ganglia, and right parietal lobe.  MRA not performed  Carotid Doppler 1-39% ICA stenosis. Vertebral artery flow is antegrade.   2D Echo - EF 60-65%. No cardiac source of emboli identified.  LDL 52  HgbA1c 7.1  VTE prophylaxis - subcutaneous heparin DIET - DYS 1 Room service appropriate?: Yes with Assist; Fluid consistency:: Thin; Fluid restriction:: Other (see comments)  aspirin 81 mg daily prior to  admission, now on aspirin 325 mg daily  Patient counseled to be compliant with his antithrombotic medications  Ongoing aggressive stroke risk factor management  Therapy recommendations: Pending  Disposition: Pending  Hypertension  Blood pressure tends to run low - on Cardizem and Zebeta  Permissive hypertension (OK if < 220/120) but gradually normalize in 5-7 days  Hyperlipidemia  Home meds: Zocor 40 mg daily - discontinued  LDL 52, goal < 70  Restart Zocor ?  Continue statin at discharge  Diabetes  HgbA1c 7.1, goal < 7.0  Controlled  Other Stroke Risk Factors  Advanced age  Cigarette smoker, quit smoking 22 years ago. Headvised to  Continue to stop smoking  ETOH use  Obesity, Body mass index is 29.74 kg/(m^2).   Coronary artery disease   Other Active Problems  Anemia  Kidney disease  Hospital day # 15  I have personally examined this patient, reviewed notes, independently viewed imaging studies, participated in medical decision making and plan of care. I have made any additions or clarifications directly to the above note. Agree with note above .As patient has concern for endocarditis given sepsis and bioprosthetic valve will check TEE. No need for loop as AFIB already documented.aspirin for now and anticoagulation only after endocarditis ruled out Antony Contras, [place] Pager: [phone] 01/09/2016 3:10 PM     To contact Stroke Continuity provider, please refer to [email or web address] After hours, contact General Neurology

## 2016-01-10 ENCOUNTER — Encounter (HOSPITAL_COMMUNITY): Payer: Self-pay | Admitting: Cardiology

## 2016-01-10 ENCOUNTER — Inpatient Hospital Stay (HOSPITAL_COMMUNITY): Payer: Medicare Other

## 2016-01-10 DIAGNOSIS — R7881 Bacteremia: Secondary | ICD-10-CM

## 2016-01-10 LAB — BASIC METABOLIC PANEL
ANION GAP: 10 (ref 5–15)
BUN: 36 mg/dL — ABNORMAL HIGH (ref 6–20)
CALCIUM: 8 mg/dL — AB (ref 8.9–10.3)
CO2: 29 mmol/L (ref 22–32)
Chloride: 100 mmol/L — ABNORMAL LOW (ref 101–111)
Creatinine, Ser: 1.98 mg/dL — ABNORMAL HIGH (ref 0.61–1.24)
GFR, EST AFRICAN AMERICAN: 33 mL/min — AB (ref >60)
GFR, EST NON AFRICAN AMERICAN: 29 mL/min — AB (ref >60)
Glucose, Bld: 381 mg/dL — ABNORMAL HIGH (ref 65–99)
Potassium: 4.2 mmol/L (ref 3.5–5.1)
SODIUM: 139 mmol/L (ref 135–145)

## 2016-01-10 LAB — CBC
HEMATOCRIT: 29.3 % — AB (ref 39.0–52.0)
Hemoglobin: 9.4 g/dL — ABNORMAL LOW (ref 13.0–17.0)
MCH: 30.2 pg (ref 26.0–34.0)
MCHC: 32.1 g/dL (ref 30.0–36.0)
MCV: 94.2 fL (ref 78.0–100.0)
PLATELETS: 135 10*3/uL — AB (ref 150–400)
RBC: 3.11 MIL/uL — ABNORMAL LOW (ref 4.22–5.81)
RDW: 14.7 % (ref 11.5–15.5)
WBC: 3 10*3/uL — AB (ref 4.0–10.5)

## 2016-01-10 LAB — GLUCOSE, CAPILLARY
GLUCOSE-CAPILLARY: 487 mg/dL — AB (ref 65–99)
GLUCOSE-CAPILLARY: 503 mg/dL — AB (ref 65–99)
Glucose-Capillary: 329 mg/dL — ABNORMAL HIGH (ref 65–99)
Glucose-Capillary: 290 mg/dL — ABNORMAL HIGH (ref 65–99)
Glucose-Capillary: 477 mg/dL — ABNORMAL HIGH (ref 65–99)
Glucose-Capillary: 408 mg/dL — ABNORMAL HIGH (ref 65–99)

## 2016-01-10 LAB — PROTIME-INR
INR: 1.25 (ref 0.00–1.49)
Prothrombin Time: 15.9 seconds — ABNORMAL HIGH (ref 11.6–15.2)

## 2016-01-10 LAB — MAGNESIUM: Magnesium: 1.7 mg/dL (ref 1.7–2.4)

## 2016-01-10 MED ORDER — PREDNISONE 50 MG PO TABS
50.0000 mg | ORAL_TABLET | Freq: Every day | ORAL | Status: DC
  Administered 2016-01-11 – 2016-01-13 (×3): 50 mg via ORAL
  Filled 2016-01-10 (×3): qty 1

## 2016-01-10 MED ORDER — INSULIN ASPART 100 UNIT/ML ~~LOC~~ SOLN
4.0000 [IU] | Freq: Three times a day (TID) | SUBCUTANEOUS | Status: DC
  Administered 2016-01-11 – 2016-01-17 (×13): 4 [IU] via SUBCUTANEOUS

## 2016-01-10 MED ORDER — ADULT MULTIVITAMIN W/MINERALS CH
1.0000 | ORAL_TABLET | Freq: Every day | ORAL | Status: DC
  Administered 2016-01-10 – 2016-01-17 (×7): 1 via ORAL
  Filled 2016-01-10 (×7): qty 1

## 2016-01-10 MED ORDER — INSULIN ASPART 100 UNIT/ML ~~LOC~~ SOLN
5.0000 [IU] | Freq: Once | SUBCUTANEOUS | Status: AC
  Administered 2016-01-10: 5 [IU] via SUBCUTANEOUS

## 2016-01-10 MED ORDER — IPRATROPIUM BROMIDE 0.02 % IN SOLN
0.5000 mg | Freq: Three times a day (TID) | RESPIRATORY_TRACT | Status: DC
  Administered 2016-01-10 – 2016-01-11 (×4): 0.5 mg via RESPIRATORY_TRACT
  Filled 2016-01-10 (×4): qty 2.5

## 2016-01-10 MED ORDER — FUROSEMIDE 10 MG/ML IJ SOLN
40.0000 mg | Freq: Once | INTRAMUSCULAR | Status: AC
  Administered 2016-01-10: 40 mg via INTRAVENOUS
  Filled 2016-01-10: qty 4

## 2016-01-10 MED ORDER — SODIUM CHLORIDE 0.9 % IV SOLN
2.0000 g | Freq: Four times a day (QID) | INTRAVENOUS | Status: DC
  Administered 2016-01-10 – 2016-01-17 (×28): 2 g via INTRAVENOUS
  Filled 2016-01-10 (×34): qty 2000

## 2016-01-10 MED ORDER — INSULIN GLARGINE 100 UNIT/ML ~~LOC~~ SOLN
30.0000 [IU] | Freq: Every day | SUBCUTANEOUS | Status: DC
  Administered 2016-01-11 – 2016-01-17 (×7): 30 [IU] via SUBCUTANEOUS
  Filled 2016-01-10 (×10): qty 0.3

## 2016-01-10 MED ORDER — LEVALBUTEROL HCL 0.63 MG/3ML IN NEBU
0.6300 mg | INHALATION_SOLUTION | Freq: Three times a day (TID) | RESPIRATORY_TRACT | Status: DC
  Administered 2016-01-10 – 2016-01-17 (×21): 0.63 mg via RESPIRATORY_TRACT
  Filled 2016-01-10 (×22): qty 3

## 2016-01-10 NOTE — Progress Notes (Signed)
[facility]   Reason for visit: Follow up on GBS bacteremia  Interval History: TEE aborted due to respiratory distress, afebrile, repeat blood cultures ngtd  Physical Exam: Constitutional:  Filed Vitals:   01/09/16 2004 01/10/16 0504  BP: 110/57 130/62  Pulse: 76 87  Temp: 98.3 F (36.8 C) 98.1 F (36.7 C)  Resp: 20 20   patient appears in NAD Respiratory: Normal respiratory effort; CTA B Cardiovascular: RRR  Review of Systems: Constitutional: negative for fevers and chills Cardiovascular: negative for chest pain Integument/breast: negative for rash  Lab Results  Component Value Date   WBC 3.0* 01/10/2016   HGB 9.4* 01/10/2016   HCT 29.3* 01/10/2016   MCV 94.2 01/10/2016   PLT 135* 01/10/2016    Lab Results  Component Value Date   CREATININE 1.98* 01/10/2016   BUN 36* 01/10/2016   NA 139 01/10/2016   K 4.2 01/10/2016   CL 100* 01/10/2016   CO2 29 01/10/2016    Lab Results  Component Value Date   ALT 19 01/06/2016   AST 22 01/06/2016   ALKPHOS 48 01/06/2016     Microbiology: Recent Results (from the past 240 hour(s))  Culture, blood (routine x 2)     Status: None   Collection Time: 01/06/16  5:19 PM  Result Value Ref Range Status   Specimen Description BLOOD RIGHT ARM  Final   Special Requests   Final    BOTTLES DRAWN AEROBIC AND ANAEROBIC 6CC BLUE 5CC PURPLE   Culture  Setup Time   Final    IN BOTH AEROBIC AND ANAEROBIC BOTTLES GRAM POSITIVE COCCI CRITICAL RESULT CALLED TO, READ BACK BY AND VERIFIED WITH: N.WILEY,RN [id number] 01/07/16 M.CAMPBELL    Culture   Final    GROUP B STREP(S.AGALACTIAE)ISOLATED SUSCEPTIBILITIES PERFORMED ON PREVIOUS CULTURE WITHIN THE LAST 5 DAYS.    Report Status 01/09/2016 FINAL  Final  Culture, blood (routine x 2)     Status: None   Collection Time: 01/06/16  5:28 PM  Result Value Ref Range Status   Specimen Description BLOOD LEFT ARM  Final   Special Requests BOTTLES DRAWN AEROBIC AND  ANAEROBIC 10CC  Final   Culture  Setup Time   Final    IN BOTH AEROBIC AND ANAEROBIC BOTTLES GRAM POSITIVE COCCI IN CHAINS IN CLUSTERS CRITICAL RESULT CALLED TO, READ BACK BY AND VERIFIED WITH: G.RASUL,RN VA:[id number] 01/07/16 M.CAMPBELL    Culture GROUP B STREP(S.AGALACTIAE)ISOLATED  Final   Report Status 01/09/2016 FINAL  Final   Organism ID, Bacteria GROUP B STREP(S.AGALACTIAE)ISOLATED  Final      Susceptibility   Group b strep(s.agalactiae)isolated - MIC*    CLINDAMYCIN >=1 RESISTANT Resistant     AMPICILLIN <=0.25 SENSITIVE Sensitive     ERYTHROMYCIN >=8 RESISTANT Resistant     VANCOMYCIN 0.5 SENSITIVE Sensitive     CEFTRIAXONE <=0.12 SENSITIVE Sensitive     LEVOFLOXACIN 4 INTERMEDIATE Intermediate     * GROUP B STREP(S.AGALACTIAE)ISOLATED  Urine culture     Status: None   Collection Time: 01/07/16 12:50 AM  Result Value Ref Range Status   Specimen Description URINE, RANDOM  Final   Special Requests NONE  Final   Culture MULTIPLE SPECIES PRESENT, SUGGEST RECOLLECTION  Final   Report Status 01/08/2016 FINAL  Final  Culture, blood (routine x 2)     Status: None (Preliminary result)   Collection Time: 01/08/16  3:27 PM  Result Value Ref Range Status   Specimen Description BLOOD LEFT ANTECUBITAL  Final   Special Requests BOTTLES DRAWN AEROBIC AND ANAEROBIC 5CC EACH  Final   Culture NO GROWTH < 24 HOURS  Final   Report Status PENDING  Incomplete  Culture, blood (routine x 2)     Status: None (Preliminary result)   Collection Time: 01/08/16  3:33 PM  Result Value Ref Range Status   Specimen Description BLOOD RIGHT HAND  Final   Special Requests BOTTLES DRAWN AEROBIC ONLY 5CC  Final   Culture NO GROWTH < 24 HOURS  Final   Report Status PENDING  Incomplete    Impression/Plan:  1. GBS bacteremia in someone with PV - ideal to get TEE.  Other option would be to for a proloned duration of 6 weeks 2. picc when blood cultures remain negative at least 48 hours.

## 2016-01-10 NOTE — Progress Notes (Signed)
Speech Language Pathology Treatment: Dysphagia  Patient Details Name: Eric [name] MRN: [id number] DOB: 05/10/1927 Today's Date: 01/10/2016 Time: 1537-1601 SLP Time Calculation (min) (ACUTE ONLY): 24 min  Assessment / Plan / Recommendation Clinical Impression  Pt demonstrates much improved arousal since last observation, pt feels he is back to baseline in ability to chew and swallow though he still has a poor appetite and initially refused PO. With encouragement pt demonstrated ability to masticate a soft turkey sandwich with his gums, prolonged mastication noted, which has been his habit for the last 20 years per patient. He requested several sips of water to help transit the bolus, but no coughing was observed throughout session. Overall pt is much improved. Recommend upgrade to Dys 3 (mechanical soft diet) with thin liquids. No SLP f/u needed as pt is back to baseline.    HPI HPI: Eric [name] is a 82 y.o. male with multiple medical problems who was admitted to the hospital on 3/7 with shortness of breath. He has a hx of aortic stenosis s/p bioprosthetic valve replacement and is on asa 81. Apparently after being here for over 10 days he started to have somewhat slurred speech and confusion since yesterday, which triggered the team to order a brain MRI that shows a few right sided embolic appearing strokes. Pt was seen by SLP prior to new CVA. At bedside pt was coughing with all textures; MBS on 3/313/17 showed oral dysphagia with piecemealing of solids, but no aspiration with any texture, even consecutive intake with thin liquids. Coughing was observed throughout assessment.  CXR 3/22: Stable mild congestive heart failure. Stable trace bilateral pleural effusions.      SLP Plan  Discharge SLP treatment due to (comment);All goals met     Recommendations  Diet recommendations: Dysphagia 3 (mechanical soft);Thin liquid Liquids provided via: Cup;Straw Medication Administration: Whole  meds with puree Supervision: Staff to assist with self feeding Compensations: Slow rate;Small sips/bites Postural Changes and/or Swallow Maneuvers: Seated upright 90 degrees             Oral Care Recommendations: Oral care BID Follow up Recommendations: Skilled Nursing facility Plan: Discharge SLP treatment due to (comment);All goals met     GO                ,  Caroline 01/10/2016, 4:05 PM    

## 2016-01-10 NOTE — Progress Notes (Signed)
RN verified with MD that pt does not echo at this time.  Pt will be having TEE next week.  Will continue to monitor.

## 2016-01-10 NOTE — Progress Notes (Signed)
Pt's blood sugar was 478mg /dL, 5 units Novolog insulin was given as ordered. K. Schorr ( on call) was notified and ordered to give 5 units of Novolog insulin. Will continue to monitor pt

## 2016-01-10 NOTE — Progress Notes (Signed)
PROGRESS NOTE  Eric [name] BLT:[id number] DOB: [birth year] DOA: 12/25/2015 PCP: [name], MD  Admit HPI / Brief Narrative: 82 y.o. male who c/o a 2 day history of generalized weakness which lead to him falling out of bed 2 days prior. He developed hip pain from this fall. He also noted the new onset wheezing and coughing with occasional phlegm production, and a runny nose w/ upper respiratory congestion. He also noted a very poor appetite with very little oral intake.   He came to the ER and was found to be tachycardic (had P waves - was irregular - possible MAT).  He was treated with nebs with improvement of resp failure and tightness. His HR continued to climb to 135. He was given Cardizem 20 mg with BP dropping after. He was given a fluid bolus, and a dilt drip at 5 mg was begun.   Patient admitted with encephalopathy, SIRS, respiratory failure thought to be secondary to Heart failure exacerbation and presume COPD exacerbation. He was found to be in A fib RVR. He received Treatment for HF, and COPD exacerbation. His Mental status improved until 3-19 when he spike fever, and was notice to be disarthric by family. He underwent MRI which was positive for multiple acute stroke. Also blood culture are positive for Strep B agalactiae. Chest x ray positive for PNA>   Plan is to proceed for TEE 3-22. ID will see patient in consultation. Anticoagulation will need to be revisited after TEE results. Follow neurology recommendation regarding anticoagulation.   HPI/Subjective: Less Wheezing, in chair, reported feeling  Tired, denies pain  Assessment/Plan: SIRS, Bacteremia, PNA: Strep Bacteremia, Agalactiae.  On admission patient met guidelines for SIRS w/ HR> 90, RR> 20, WBC> 12 - no infiltrate on CXR - no + culture results - UA not c/w UTI . LFT normal  Spike fever 3-19, Chest X ray: mild posterior left lower infiltrates.  abx history since admission: levaquin 3/7 > 3/15 vanc on 3/19  and 20 Zosyn 3/19-3/21 ampicillin from 3/21  Planned for TEE 3-22 cancelled due to hypoxia with sedation, need to reschedule once respiratory status improves to rule out endocarditis. Patient with history of Aortic bioprosthetic valve replacement, Blood culture 3-19 growing: strep B agalactiae. Repeat blood culture from 3/21 pending, appreciate ID input  Acute stroke, multiple infarcts;  Likely embolic, in setting of A fib but also Bacteremia. Per nueorlogy anticoagulation only after endocarditis ruled out.  Currently on asa 3105m qd, Appreciate neurology input, per neuro: no anticoagulation until rule out infective endocarditis   Acute COPD exac - acute hypoxic resp failure  Wheezing on 3/22, repeat cxr with ? pulmonary edema,   schedule nebulizer, add mucinex, trial of lasix,  3/23 less wheezing, taper steroids  A fib w/ RVR , CAHADSvasc score 7 ( age/htn/chf/dm2/cva) Cardiology following - IV cardizem now transitioned to oral - TSH normal - high risk for anticoag given recent falls - Cards suggests ASA 325 for now and reconsider anticaog in outpt f/u - no amio due to severe lung disease  Continue with Bisoprolol. cardizem , she was on dig briefly, this was stopped on 3/18 by cardiology. Discussed wi-th family 3-20, patient prior to admission was active. He fell prior to admission because he was sick. Prior to admission , he used to go to grocery store, restaurants with assistance of son. They understand risk for bleeding and with risk of falling. Patient will be discharge to SNF. They agree with coumadin. This will need to  be reevaluate after TEE results. Needs to follow up Neurology recommendations.    Acute mental status change -Multifactorial to include SIRS, Ativan, steroids, acute on chronic renal failure, sundowning  -CT head unrevealing initially  -Worsening MS 3-19, MRI positive for stroke. Spike fever 3-19.   Dysphagia Cleared for D1 diet w/ thin liquids per SLP , continue  aspiration precaution. Patient want to eat, report tired of eating puree diet. Nutrition consulted, continue speech therapy  Insulin dependent DM2 - acutely severely uncontrolled  A1c 7.1 - required insulin gtt earlier in stay - CBG reasonably controlled - follow w/o change today  Increase insulin in the setting of increase steroids  N/V - resolved  KUB noted gaseous distention of stomach on BIPAP - suspect bloating was mostly air blown in w/ BIPAP - simethicone - resolved   Constipation; better  Acute on CKD Stage 3 Baseline creatinine 1.6 - creatinine at presentation 2.58 w/ peak of 3.12 - avoid ACE/ARB - crt now at ~baseline  [facility] Weights   01/08/16 0542 01/09/16 0542 01/10/16 0504  Weight: 91.309 kg (201 lb 4.8 oz) 91.4 kg (201 lb 8 oz) 91.989 kg (202 lb 12.8 oz)   Cr at 2.6. Holding  diuretics.   Acute Diastolic HF;  Good urine out put.  Cardiology managing  Holding torsemide due to increase cr.   Hyponatremia Appears to have been due to hypovolemia and hyperglycemia - resolved   Coronary artery disease status post CABG x3 2010 Cont ASA + lipid med  Aortic stenosis status post valve replacement 2010 Pericardial tissue valve stable on TTE   Carotid artery stenosis  Dopplers May 2015 showed a 60-79% right and 1-39% left stenosis  Hyperlipidemia Cont med tx  GERD  HTN Not an active issue at this time   Code Status: DO NOT RESUSCITATE Family Communication: daughter at bedside Disposition Plan: pending, need to treat copd, then TEE  Consultants: [facility] Cards  Neurology ID  Procedures: 3/9 TTE - EF 60-65% - Aortic valve: A bioprosthesis was present and functioning normally - Mitral valve: severe stenosis. Mean gradient (D): 11 mm Hg - Right ventricle: size was normal. Wall thickness normal. Systolic function was normal - Tricuspid valve: trivial regurgitation.  TEE pending  Antibiotics: levaquin 3/7 > 3/15 vanc on 3/19 and 20 Zosyn 3/19-3/21 ampicillin  from 3/21  DVT prophylaxis: Subcutaneous heparin  Objective: Blood pressure 120/54, pulse 54, temperature 97.5 F (36.4 C), temperature source Oral, resp. rate 18, height 5' 9" (1.753 m), weight 91.989 kg (202 lb 12.8 oz), SpO2 95 %.  Intake/Output Summary (Last 24 hours) at 01/10/16 1707 Last data filed at 01/10/16 1324  Gross per 24 hour  Intake   1080 ml  Output   1075 ml  Net      5 ml   Filed Weights   01/08/16 0542 01/09/16 0542 01/10/16 0504  Weight: 91.309 kg (201 lb 4.8 oz) 91.4 kg (201 lb 8 oz) 91.989 kg (202 lb 12.8 oz)   Exam: General: pleasant , aaox3.  Lungs: less bilateral wheezing  Cardiovascular: IRRR Abdomen: NT/ND, soft, bs+, no mass, no rebound  Extremities: no significant cyanosis, or clubbing - 2+ edema bilateral lower extremities documented before has much improved, only trace edema on 3/22 Neuro: aaox3, pleasant, no focal weakness, ? Slurred speech  Data Reviewed:  Basic Metabolic Panel:  Recent Labs Lab 01/06/16 0220 01/07/16 0214 01/08/16 0252 01/09/16 0608 01/10/16 0300  NA 136 139 140 140 139  K 4.1 3.8 3.7 3.3*  4.2  CL 95* 98* 98* 99* 100*  CO2 _0 GLUCOSE 324* 339* 307* 214* 381*  BUN 39* 47* 48* 39* 36*  CREATININE 2.33* 2.84* 2.62* 2.10* 1.98*  CALCIUM 8.1* 7.9* 7.7* 8.0* 8.0*  MG  --   --   --   --  1.7    CBC:  Recent Labs Lab 01/05/16 0352 01/07/16 0214 01/08/16 0252 01/10/16 0300  WBC 12.6* 11.5* 6.7 3.0*  HGB 10.0* 9.3* 8.5* 9.4*  HCT 31.6* 29.5* 27.5* 29.3*  MCV 93.2 93.1 94.2 94.2  PLT 255 176 134* 135*   Liver Function Tests:  Recent Labs Lab 01/06/16 1736  AST 22  ALT 19  ALKPHOS 48  BILITOT 1.2  PROT 5.6*  ALBUMIN 2.4*   CBG:  Recent Labs Lab 01/09/16 1628 01/09/16 2124 01/10/16 0612 01/10/16 1102 01/10/16 1613  GLUCAP 377* 253* 329* 290* 477*    Recent Results (from the past 240 hour(s))  Culture, blood (routine x 2)     Status: None   Collection Time: 01/06/16  5:19 PM   Result Value Ref Range Status   Specimen Description BLOOD RIGHT ARM  Final   Special Requests   Final    BOTTLES DRAWN AEROBIC AND ANAEROBIC 6CC BLUE 5CC PURPLE   Culture  Setup Time   Final    IN BOTH AEROBIC AND ANAEROBIC BOTTLES GRAM POSITIVE COCCI CRITICAL RESULT CALLED TO, READ BACK BY AND VERIFIED WITH: N.WILEY,RN [id number] 01/07/16 M.CAMPBELL    Culture   Final    GROUP B STREP(S.AGALACTIAE)ISOLATED SUSCEPTIBILITIES PERFORMED ON PREVIOUS CULTURE WITHIN THE LAST 5 DAYS.    Report Status 01/09/2016 FINAL  Final  Culture, blood (routine x 2)     Status: None   Collection Time: 01/06/16  5:28 PM  Result Value Ref Range Status   Specimen Description BLOOD LEFT ARM  Final   Special Requests BOTTLES DRAWN AEROBIC AND ANAEROBIC 10CC  Final   Culture  Setup Time   Final    IN BOTH AEROBIC AND ANAEROBIC BOTTLES GRAM POSITIVE COCCI IN CHAINS IN CLUSTERS CRITICAL RESULT CALLED TO, READ BACK BY AND VERIFIED WITH: G.RASUL,RN [id number] 01/07/16 M.CAMPBELL    Culture GROUP B STREP(S.AGALACTIAE)ISOLATED  Final   Report Status 01/09/2016 FINAL  Final   Organism ID, Bacteria GROUP B STREP(S.AGALACTIAE)ISOLATED  Final      Susceptibility   Group b strep(s.agalactiae)isolated - MIC*    CLINDAMYCIN >=1 RESISTANT Resistant     AMPICILLIN <=0.25 SENSITIVE Sensitive     ERYTHROMYCIN >=8 RESISTANT Resistant     VANCOMYCIN 0.5 SENSITIVE Sensitive     CEFTRIAXONE <=0.12 SENSITIVE Sensitive     LEVOFLOXACIN 4 INTERMEDIATE Intermediate     * GROUP B STREP(S.AGALACTIAE)ISOLATED  Urine culture     Status: None   Collection Time: 01/07/16 12:50 AM  Result Value Ref Range Status   Specimen Description URINE, RANDOM  Final   Special Requests NONE  Final   Culture MULTIPLE SPECIES PRESENT, SUGGEST RECOLLECTION  Final   Report Status 01/08/2016 FINAL  Final  Culture, blood (routine x 2)     Status: None (Preliminary result)   Collection Time: 01/08/16  3:27 PM  Result Value Ref Range Status   Specimen  Description BLOOD LEFT ANTECUBITAL  Final   Special Requests BOTTLES DRAWN AEROBIC AND ANAEROBIC 5CC EACH  Final   Culture NO GROWTH 2 DAYS  Final   Report Status PENDING  Incomplete  Culture, blood (routine x 2)  Status: None (Preliminary result)   Collection Time: 01/08/16  3:33 PM  Result Value Ref Range Status   Specimen Description BLOOD RIGHT HAND  Final   Special Requests BOTTLES DRAWN AEROBIC ONLY 5CC  Final   Culture NO GROWTH 2 DAYS  Final   Report Status PENDING  Incomplete    Studies:   Recent x-ray studies have been reviewed in detail by the Attending Physician  Scheduled Meds:  Scheduled Meds: . ampicillin (OMNIPEN) IV  2 g Intravenous 4 times per day  . antiseptic oral rinse  7 mL Mouth Rinse BID  . aspirin  324 mg Oral Daily  . atorvastatin  20 mg Oral q1800  . bisoprolol  2.5 mg Oral Daily  . budesonide (PULMICORT) nebulizer solution  0.25 mg Nebulization BID  . diltiazem  120 mg Oral 3 times per day  . feeding supplement (GLUCERNA SHAKE)  237 mL Oral TID BM  . furosemide  40 mg Intravenous Once  . guaiFENesin  600 mg Oral BID  . heparin subcutaneous  5,000 Units Subcutaneous 3 times per day  . insulin aspart  0-20 Units Subcutaneous TID WC  . insulin aspart  0-5 Units Subcutaneous QHS  . [START ON 01/11/2016] insulin aspart  4 Units Subcutaneous TID WC  . [START ON 01/11/2016] insulin glargine  30 Units Subcutaneous Daily  . ipratropium  0.5 mg Nebulization TID  . levalbuterol  0.63 mg Nebulization TID  . multivitamin with minerals  1 tablet Oral Daily  . pantoprazole sodium  40 mg Oral BID  . polyethylene glycol  17 g Oral BID  . [START ON 01/11/2016] predniSONE  50 mg Oral Q breakfast  . sodium chloride flush  3 mL Intravenous Q12H    Time spent on care of this patient: 84 mins   Jasa Dundon , MD PhD [phone]  [facility] Office  [phone] Pager - Text Page per Amion as per below:  On-Call/Text Page:      [email or web address]      password  [email or web address]  If 7PM-7AM, please contact night-coverage [email or web address] Password [email or web address] 01/10/2016, 5:07 PM   LOS: 16 days

## 2016-01-10 NOTE — Progress Notes (Signed)
Pt cbg 477.  MD notified.

## 2016-01-10 NOTE — Progress Notes (Signed)
  Hospital Problem List     Principal Problem:   Acute respiratory distress ([name] Active Problems:   HYPERCHOLESTEROLEMIA   Bronchitis   Diabetes mellitus with complication ([name]   S/P CABG (coronary artery bypass graft)   S/P AVR (aortic valve replacement)   Atrial fibrillation with RVR ([name]   Multifocal atrial tachycardia ([name]   Sepsis ([name]   COPD exacerbation ([name]   Metabolic acidosis   Dilated cardiomyopathy ([name]   Pulmonary hypertension ([name]   CAD in native artery   H/O aortic valve replacement   Uncontrolled type 2 diabetes mellitus with complication ([name]   Hyponatremia   Hypotension   Acute renal failure superimposed on stage 3 chronic kidney disease ([name]   Delirium   Acute renal failure ([name]   Cerebral thrombosis with cerebral infarction   Pressure ulcer   Slurred speech   Insulin dependent diabetes mellitus ([name]    Patient Profile:   Primary Cardiologist: Dr. Crenshaw  82 y.o. male w/ PMH of CAD (s/p CABG 2010), aortic stenosis (s/p AVR 2010), Type 2 DM, HTN, and HLD admitted on 12/25/2015 for worsening cough and wheezing. Met SIRS criteria, diagnosed with PNA, now positive for strep bacteremia. Cards consulted for new-onset atrial fibrillation w/ RVR.   Subjective   Unable to have TEE performed yesterday due to hypoxia with minimal sedation and the probe being unable to be inserted safely. Sleepy but arousable this morning. Just finished a breathing treatment. Denies any chest pain or palpitations.   Inpatient Medications    . ampicillin (OMNIPEN) IV  2 g Intravenous 6 times per day  . antiseptic oral rinse  7 mL Mouth Rinse BID  . aspirin  324 mg Oral Daily  . atorvastatin  20 mg Oral q1800  . bisoprolol  2.5 mg Oral Daily  . budesonide (PULMICORT) nebulizer solution  0.25 mg Nebulization BID  . diltiazem  120 mg Oral 3 times per day  . feeding supplement (GLUCERNA SHAKE)  237 mL Oral TID BM  . guaiFENesin  600 mg Oral BID  . heparin  subcutaneous  5,000 Units Subcutaneous 3 times per day  . insulin aspart  0-20 Units Subcutaneous TID WC  . insulin aspart  0-5 Units Subcutaneous QHS  . insulin glargine  20 Units Subcutaneous Daily  . ipratropium  0.5 mg Nebulization Q6H  . levalbuterol  0.63 mg Nebulization Q6H  . methylPREDNISolone (SOLU-MEDROL) injection  60 mg Intravenous Q24H  . pantoprazole sodium  40 mg Oral BID  . polyethylene glycol  17 g Oral BID  . sodium chloride flush  3 mL Intravenous Q12H    Vital Signs    Filed Vitals:   01/09/16 2035 01/10/16 0504 01/10/16 0801 01/10/16 0802  BP:  130/62    Pulse:  87    Temp:  98.1 F (36.7 C)    TempSrc:  Oral    Resp:  20    Height:      Weight:  202 lb 12.8 oz (91.989 kg)    SpO2: 95% 93% 93% 93%    Intake/Output Summary (Last 24 hours) at 01/10/16 0803 Last data filed at 01/10/16 0504  Gross per 24 hour  Intake    960 ml  Output    801 ml  Net    159 ml   Filed Weights   01/08/16 0542 01/09/16 0542 01/10/16 0504  Weight: 201 lb 4.8 oz (91.309 kg) 201 lb 8 oz (91.4 kg) 202 lb 12.8 oz (91.989 kg)      Physical Exam    General: Elderly Caucasian  male appearing in no acute distress. Head: Normocephalic, atraumatic.  Neck: Supple without bruits, JVD not elevated. Lungs:  Resp regular and unlabored, expiratory wheezing noted throughout upper lung fields. No rales appreciated. Heart: Irregularly irregular, S1, S2, no S3, S4, or murmur; no rub. Abdomen: Soft, non-tender, non-distended with normoactive bowel sounds. No hepatomegaly. No rebound/guarding. No obvious abdominal masses. Extremities: No clubbing, cyanosis, or edema. Distal pedal pulses are 2+ bilaterally. Neuro: Alert and oriented X 3. Moves all extremities spontaneously. Psych: Normal affect.  Labs    CBC  Recent Labs  01/08/16 0252 01/10/16 0300  WBC 6.7 3.0*  HGB 8.5* 9.4*  HCT 27.5* 29.3*  MCV 94.2 94.2  PLT 134* 476*   Basic Metabolic Panel  Recent Labs  01/09/16 0608  01/10/16 0300  NA 140 139  K 3.3* 4.2  CL 99* 100*  CO2 29 29  GLUCOSE 214* 381*  BUN 39* 36*  CREATININE 2.10* 1.98*  CALCIUM 8.0* 8.0*  MG  --  1.7     Telemetry    Atrial fibrillation, rate-controlled in the 70's - 80's.   ECG    No new tracings.   Cardiac Studies and Radiology    Dg Chest 2 View: 01/06/2016  CLINICAL DATA:  Fevers EXAM: CHEST  2 VIEW COMPARISON:  12/25/2015 FINDINGS: The cardiac shadow is within normal limits. Postsurgical changes are again noted. The lungs are well aerated bilaterally . Minimal increased density is noted in the posterior costophrenic angle on the lateral projection. No acute bony abnormality is seen. IMPRESSION: Mild posterior left lower lobe infiltrate. Electronically Signed   By: Inez Catalina M.D.   On: 01/06/2016 17:43    Mr Brain Wo Contrast: 01/06/2016  CLINICAL DATA:  Slurred speech.  Weakness and fall. EXAM: MRI HEAD WITHOUT CONTRAST TECHNIQUE: Multiplanar, multiecho pulse sequences of the brain and surrounding structures were obtained without intravenous contrast. COMPARISON:  CT head 12/28/2015 FINDINGS: Multiple areas of restricted diffusion compatible with acute infarct. 5 x 10 mm acute infarct right lateral cerebellum. Subcentimeter acute infarct in the right caudate. Small areas of acute infarct in the right frontal parietal white matter and right parietal cortex. Probable emboli. Moderate to advanced atrophy. Mild chronic microvascular ischemic changes in the white matter. Chronic infarcts in the cerebellum bilaterally. Negative for intracranial hemorrhage.  Negative for mass or edema. Image quality degraded by moderate motion. Pituitary not enlarged.  Paranasal sinuses clear. IMPRESSION: Multiple small areas of acute infarct including the right cerebellum, right basal ganglia, and right parietal lobe. Probable emboli. Atrophy and chronic microvascular ischemia Image quality degraded by motion Electronically Signed   By: Franchot Gallo M.D.    On: 01/06/2016 17:01    Echocardiogram: 12/27/2015 Study Conclusions  - Left ventricle: The cavity size was normal. Systolic function was  normal. The estimated ejection fraction was in the range of 60%  to 65%. Wall motion was normal; there were no regional wall  motion abnormalities. The study is not technically sufficient to  allow evaluation of LV diastolic function. - Aortic valve: A bioprosthesis was present and functioning  normally. - Mitral valve: Severely calcified annulus. The findings are  consistent with severe stenosis. Mean gradient (D): 11 mm Hg. - Right ventricle: The cavity size was normal. Wall thickness was  normal. Systolic function was normal. - Tricuspid valve: There was trivial regurgitation.  Impressions: - Poor sound transmission prohibits full assessment of the aortic  valve. Bioprosthetic valve  appears to be functioning properly  without significant stenosis or regurgitation.   Echocardiogram: 01/03/2016 Study Conclusions - HPI and indications: Limited study to re-assess mitral valve  gradients. - Procedure narrative: Transthoracic echocardiography. Image  quality was adequate. The study was technically difficult. - Left ventricle: The cavity size was normal. Wall thickness was  increased in a pattern of mild LVH. Systolic function was normal.  The estimated ejection fraction was in the range of 60% to 65%.  Wall motion was normal; there were no regional wall motion  abnormalities. - Mitral valve: Heavy posterior MAC. Mild mitral stenosis. Trivial  regurgitation. Pressure half-time: 89 ms. Mean gradient (D): 5 mm  Hg. Valve area by pressure half-time: 2.34 cm^2.  Impressions: - Compared to a prior study on 12/27/15, there is only mild mitral  stenosis. This was reported as severe, however, I suspect a jet  sampling error. There is heavy posterior MAC with decreased  posterior leaflet excursion, however, the anterior leaflet  opens  well.  Assessment & Plan    1. New onset Atrial Fibrillation w/ RVR - diagnosed this admission. - This patients CHA2DS2-VASc Score and unadjusted Ischemic Stroke Rate (% per year) is equal to 11.2 % stroke rate/year from a score of 7 (HTN, DM, Vascular, Age (2), Stroke (2)).  Coumadin is to be considered after the TEE has been performed, as this was originally scheduled on 01/09/2016 but unable to be performed secondary to hypoxia with sedation. - currently on Cardizem 120mg Q8H and Bisoprolol 2.5mg daily with adequate rate-control at this time.  2. CAD - history of CABG in 2010 - continue ASA, Statin, and BB. Would continue with Atorvastatin at time of discharge due to interaction of Cardizem and PTA Simvastatin.  3. Aortic Stenosis - s/p AVR in 2010. - functioning normally on recent echocardiogram  4. Acute CVA - MRI on 01/06/2016 noted multiple small areas of acute infarct including the right cerebellum, right basal ganglia, and right parietal lobe. Probable emboli. - recommend anticoagulation in regards to his atrial fibrillation with new embolic CVA. Would start following his TEE, if normal. - Neurology following.  5. Strep Bacteremia - unable to have TEE yesterday due to hypoxia with minimal sedation. Will reschedule once respiratory status improves.  6. AKI - creatinine improved to 1.98 on 01/10/2016.   7. Volume Overload - occurring in the setting of AKI and treatment for Sepsis.  - improved, and is now + 2.3L this admission. Does not appear overly volume overloaded on physical exam.   Signed, Brittany M Strader , PA-C 8:03 AM 01/10/2016 Pager: [phone] Patient seen and examined. I agree with the assessment and plan as detailed above. See also my additional thoughts below.   Plan for today will be antibiotics for bacteremia.TEE will be considered over time as his overall respiratory status stabilizes.   , MD, FACC 01/10/2016 8:30 AM   

## 2016-01-10 NOTE — Care Management Important Message (Signed)
Important Message  Patient Details  Name: Eric [name] MRN: [id number] Date of Birth: 01-Oct-1927   Medicare Important Message Given:  Yes    Lauralynn Loeb P [name] 01/10/2016, 1:06 PM

## 2016-01-10 NOTE — Progress Notes (Signed)
Physical Therapy Treatment Patient Details Name: Eric [name] MRN: [id number] DOB: 07/06/1927 Today's Date: 01/10/2016    History of Present Illness 82 yo presented with 2 day history of generalized weakness which resulted in patient falling out of bed 2 days ago. Developed hip pain from this fall which persisted. History of hip fracture and surgical repair. patient has had new onset wheezing and coughing with occasional phlegm production. pt presents with A-flutter, COPD Exacerbation, and N/V.  pt with hx of AVR, CABG, HTN, DM, Shingles, and L THR.  Pt with A-fib with RVR. COPD/Bronchitis.     PT Comments    Slight improvement in ability to bear weight through legs allowing OOB with squat pivot rather than having to use lift even though still safest technique for nursing staff.  Will continue to need skilled PT upon d/c at SNF level of care.  Follow Up Recommendations  SNF     Equipment Recommendations  None recommended by PT    Recommendations for Other Services       Precautions / Restrictions Precautions Precautions: Fall Precaution Comments: watch O2 Sats`    Mobility  Bed Mobility Overal bed mobility: Needs Assistance Bed Mobility: Rolling;Sidelying to Sit Rolling: Mod assist Sidelying to sit: Mod assist;+2 for physical assistance       General bed mobility comments: assist to reach to rail and turn hips and lift trunk upright  Transfers Overall transfer level: Needs assistance Equipment used: None Transfers: Squat Pivot Transfers     Squat pivot transfers: +2 physical assistance;Max assist;Mod assist     General transfer comment: took about 4 times squat pivot to L to recliner with drop arm to pivot completely into chair, was able to clear hips with first sit to squat, but with fatigue needed help to scoot and guide hips into chair  Ambulation/Gait                 Stairs            Wheelchair Mobility    Modified Rankin (Stroke Patients  Only)       Balance   Sitting-balance support: Bilateral upper extremity supported;Feet supported Sitting balance-Leahy Scale: Poor Sitting balance - Comments: catching self when R arm gives away due to weakness and fatigue, min A for safety                            Cognition Arousal/Alertness: Lethargic Behavior During Therapy: WFL for tasks assessed/performed Overall Cognitive Status: No family/caregiver present to determine baseline cognitive functioning                      Exercises General Exercises - Lower Extremity Ankle Circles/Pumps: AROM;Both;5 reps;Supine Short Arc Quad: AROM;Both;10 reps;Supine Heel Slides: AAROM;Both;5 reps;Supine    General Comments General comments (skin integrity, edema, etc.): lightly soiled with BM after rolled in process of coming to sit so cleaned while in sidelying prior to coming up to sit.  Patient denies abiltiy to even attempt to stand so much encouragement needed to try      Pertinent Vitals/Pain Faces Pain Scale: Hurts little more Pain Location: generalized with mobility  Pain Descriptors / Indicators: Aching Pain Intervention(s): Monitored during session;Repositioned;Limited activity within patient's tolerance    Home Living                      Prior Function  PT Goals (current goals can now be found in the care plan section) Progress towards PT goals: Progressing toward goals    Frequency  Min 2X/week    PT Plan Current plan remains appropriate    Co-evaluation             End of Session Equipment Utilized During Treatment: Gait belt Activity Tolerance: Patient limited by fatigue Patient left: with chair alarm set;with call bell/phone within reach;in chair     Time: 1032-1100 PT Time Calculation (min) (ACUTE ONLY): 28 min  Charges:  $Therapeutic Exercise: 8-22 mins $Therapeutic Activity: 8-22 mins                    G Codes:      [name] 2016-01-25, 4:05  PM  [name], [place] 01-25-2016

## 2016-01-10 NOTE — Progress Notes (Signed)
STROKE TEAM PROGRESS NOTE   HISTORY OF PRESENT ILLNESS Eric [name] is a 82 y.o. male with multiple medical problems who was admitted to the hospital on 3/7 with shortness of breath. He has a hx of aortic stenosis s/p bioprosthetic valve replacement and is on asa 81. Apparently after being here for over 10 days he started to have somewhat slurred speech and confusion since yesterday, which triggered the team to order a brain MRI that shows a few right sided embolic appearing strokes. Neurology was consulted earlier today for this reason and when I arrived at 7pm I was asked to see pt in consultation. Pt is somewhat confused and unable to provide further hx.   SUBJECTIVE (INTERVAL HISTORY)  TEE  unable to be performed  Y`day due to resp difficulty Overall he feels his condition is  unchanged     OBJECTIVE Temp:  [97.5 F (36.4 C)-98.3 F (36.8 C)] 97.5 F (36.4 C) (03/23 1215) Pulse Rate:  [54-87] 54 (03/23 1215) Cardiac Rhythm:  [-] Atrial fibrillation (03/23 0755) Resp:  [18-20] 18 (03/23 1215) BP: (110-130)/(54-62) 120/54 mmHg (03/23 1215) SpO2:  [91 %-96 %] 96 % (03/23 1215) Weight:  [202 lb 12.8 oz (91.989 kg)] 202 lb 12.8 oz (91.989 kg) (03/23 0504)  CBC:   Recent Labs Lab 01/08/16 0252 01/10/16 0300  WBC 6.7 3.0*  HGB 8.5* 9.4*  HCT 27.5* 29.3*  MCV 94.2 94.2  PLT 134* 135*    Basic Metabolic Panel:   Recent Labs Lab 01/09/16 0608 01/10/16 0300  NA 140 139  K 3.3* 4.2  CL 99* 100*  CO2 29 29  GLUCOSE 214* 381*  BUN 39* 36*  CREATININE 2.10* 1.98*  CALCIUM 8.0* 8.0*  MG  --  1.7    Lipid Panel:     Component Value Date/Time   CHOL 107 01/07/2016 0214   TRIG 112 01/07/2016 0214   HDL 33* 01/07/2016 0214   CHOLHDL 3.2 01/07/2016 0214   VLDL 22 01/07/2016 0214   LDLCALC 52 01/07/2016 0214   HgbA1c:  Lab Results  Component Value Date   HGBA1C 7.1* 12/25/2015   Urine Drug Screen: No results found for: LABOPIA, COCAINSCRNUR, LABBENZ, AMPHETMU,  THCU, LABBARB    IMAGING  Dg Chest 2 View 01/06/2016   Mild posterior left lower lobe infiltrate.   Mr Brain Wo Contrast 01/06/2016   Multiple small areas of acute infarct including the right cerebellum, right basal ganglia, and right parietal lobe. Probable emboli. Atrophy and chronic microvascular ischemia Image quality degraded by motion.   PHYSICAL EXAM Pleasant elderly male not in distress. . Afebrile. Head is nontraumatic. Neck is supple without bruit.    Cardiac exam no murmur or gallop. Lungs are clear to auscultation. Distal pulses are well felt. Neurological Exam ;  Awake  Alert oriented Mildly dysarthric speech but normal language.eye movements full without nystagmus.fundi were not visualized. Vision acuity and fields appear normal. Hearing is normal. Palatal movements are normal. Face symmetric. Tongue midline. Normal strength, tone, reflexes and coordination. Normal sensation. Gait deferred.     ASSESSMENT/PLAN [name] is a 82 y.o. male with history of cerebrovascular disease, coronary artery disease, hyperlipidemia, hypertension, diabetes mellitus, and aortic stenosis status post prosthetic valve replacement, presenting with slurred speech and confusion. He did not receive IV t-PA due to late presentation.  Stroke:  Non-dominant multiple small right-sided infarcts probably embolic from an unknown source.  Resultant   Gait ataxia  MRI  Multiple small areas of  acute infarct including the right cerebellum, right basal ganglia, and right parietal lobe.  MRA not performed  Carotid Doppler 1-39% ICA stenosis. Vertebral artery flow is antegrade.   2D Echo - EF 60-65%. No cardiac source of emboli identified.  LDL 52  HgbA1c 7.1  VTE prophylaxis - subcutaneous heparin DIET - DYS 1 Room service appropriate?: Yes with Assist; Fluid consistency:: Thin; Fluid restriction:: Other (see comments)  aspirin 81 mg daily prior to admission, now on aspirin 325 mg  daily  Patient counseled to be compliant with his antithrombotic medications  Ongoing aggressive stroke risk factor management  Therapy recommendations: Pending  Disposition: Pending  Hypertension  Blood pressure tends to run low - on Cardizem and Zebeta  Permissive hypertension (OK if < 220/120) but gradually normalize in 5-7 days  Hyperlipidemia  Home meds: Zocor 40 mg daily - discontinued  LDL 52, goal < 70  Restart Zocor ?  Continue statin at discharge  Diabetes  HgbA1c 7.1, goal < 7.0  Controlled  Other Stroke Risk Factors  Advanced age  Cigarette smoker, quit smoking 22 years ago. Headvised to  Continue to stop smoking  ETOH use  Obesity, Body mass index is 29.93 kg/(m^2).   Coronary artery disease   Other Active Problems  Anemia  Kidney disease  Hospital day # 16  I have personally examined this patient, reviewed notes, independently viewed imaging studies, participated in medical decision making and plan of care. I have made any additions or clarifications directly to the above note. Agree with note above .As patient has concern for endocarditis given sepsis and bioprosthetic valve will check TEE and if not possible to do so do prolonged antibiotic course. No need for loop as AFIB already documented.aspirin for now and anticoagulation only after endocarditis ruled out. Stroke team will sign off. Call for questions Antony Contras, MD Medical Director [place] Pager: [phone] 01/10/2016 1:47 PM     To contact Stroke Continuity provider, please refer to [email or web address] After hours, contact General Neurology

## 2016-01-10 NOTE — Progress Notes (Signed)
Rechecked blood sugar was 408 , K. Schorr was notified, still awaiting for orders, will continue to monitor pt

## 2016-01-10 NOTE — Progress Notes (Signed)
Nutrition Follow-up  DOCUMENTATION CODES:   Obesity unspecified  INTERVENTION:  Continue Glucerna Shake po TID, each supplement provides 220 kcal and 10 grams of protein Provide Multivitamin with minerals D/C Magic Cup ice cream with meals, provide sugar-free pudding or ice cream instead   NUTRITION DIAGNOSIS:   Inadequate oral intake related to poor appetite as evidenced by meal completion < 25%.  Ongoing  GOAL:   Patient will meet greater than or equal to 90% of their needs  Unmet  MONITOR:   PO intake, Supplement acceptance, Diet advancement, Labs, Weight trends, Skin, I & O's  REASON FOR ASSESSMENT:   Consult Assessment of nutrition requirement/status  ASSESSMENT:   Patient reports 2 day history of generalized weakness which resulted in patient falling out of bed 2 days ago. Developed hip pain from this fall which persisted into today. History of hip fracture and surgical repair. Over the last 2 days patient has had new onset wheezing and coughing with occasional phlegm production. Denies any fevers, chest pain, palpitations, LOC, nausea, vomiting. Patient does endorse runny nose and upper respiratory congestion. Very poor appetite during this period time. Very little oral intake is pushing fluids. Patient states that he smoked for 50 years approximately one pack per day but quit approximately 20 years ago.  Pt has been more alert the past couple days and PO intake has improved. Per nursing notes, pt at 50% of breakfast and lunch today and 25% of two meals yesterday. Pt denies nausea, but daughter at bedside states that his stomach is torn up. Pt likes Glucerna Shakes and is agreeable to continuing drinking them.  Labs: high glucose, low hemoglobin, low calcium  Diet Order:  DIET - DYS 1 Room service appropriate?: Yes with Assist; Fluid consistency:: Thin; Fluid restriction:: Other (see comments)  Skin:  Wound (see comment) (stage I PU to buttocks)  Last BM:   3/23  Height:   Ht Readings from Last 1 Encounters:  12/25/15 5\' 9"  (1.753 m)    Weight:   Wt Readings from Last 1 Encounters:  01/10/16 202 lb 12.8 oz (91.989 kg)    Ideal Body Weight:  72.7 kg  BMI:  Body mass index is 29.93 kg/(m^2).  Estimated Nutritional Needs:   Kcal:  1800-2000  Protein:  85-100 grams  Fluid:  1.8-2.0 L  EDUCATION NEEDS:   Education needs addressed  Scarlette Ar RD, LDN Inpatient Clinical Dietitian Pager: [phone] After Hours Pager: [phone]

## 2016-01-10 NOTE — Progress Notes (Signed)
Inpatient Diabetes Program Recommendations  AACE/ADA: New Consensus Statement on Inpatient Glycemic Control (2015)  Target Ranges:  Prepandial:   less than 140 mg/dL      Peak postprandial:   less than 180 mg/dL (1-2 hours)      Critically ill patients:  140 - 180 mg/dL   Review of Glycemic Control:  Results for Eric [name], Eric [name] (MRN [id number]) as of 01/10/2016 10:37  Ref. Range 01/09/2016 06:33 01/09/2016 12:03 01/09/2016 16:28 01/09/2016 21:24 01/10/2016 06:12  Glucose-Capillary Latest Ref Range: 65-99 mg/dL 173 (H) 215 (H) 377 (H) 253 (H) 329 (H)   Diabetes history: Diabetes Mellitus Outpatient Diabetes medications: Lantus 28 units q HS, Glipizide 5 mg daily Current orders for Inpatient glycemic control:  Lantus 20 units daily, Novolog resistant tid with meals and HS  Inpatient Diabetes Program Recommendations: Insulin - Basal: Please consider increasing Lantus to 30 units QHS. Insulin - Meal Coverage: Noted steroids were decreased today. While inpatient and ordered steroids, please consider ordering Novolog 4 units TID with meals for meal coverage.  Thanks, Adah Perl, RN, BC-ADM Inpatient Diabetes Coordinator Pager [phone] (8a-5p)

## 2016-01-11 DIAGNOSIS — N289 Disorder of kidney and ureter, unspecified: Secondary | ICD-10-CM

## 2016-01-11 LAB — GLUCOSE, CAPILLARY
GLUCOSE-CAPILLARY: 325 mg/dL — AB (ref 65–99)
GLUCOSE-CAPILLARY: 345 mg/dL — AB (ref 65–99)
GLUCOSE-CAPILLARY: 228 mg/dL — AB (ref 65–99)
Glucose-Capillary: 255 mg/dL — ABNORMAL HIGH (ref 65–99)
Glucose-Capillary: 300 mg/dL — ABNORMAL HIGH (ref 65–99)

## 2016-01-11 LAB — BASIC METABOLIC PANEL
ANION GAP: 10 (ref 5–15)
BUN: 37 mg/dL — ABNORMAL HIGH (ref 6–20)
CO2: 29 mmol/L (ref 22–32)
Calcium: 8.2 mg/dL — ABNORMAL LOW (ref 8.9–10.3)
Chloride: 102 mmol/L (ref 101–111)
Creatinine, Ser: 2.21 mg/dL — ABNORMAL HIGH (ref 0.61–1.24)
GFR calc Af Amer: 29 mL/min — ABNORMAL LOW (ref >60)
GFR calc non Af Amer: 25 mL/min — ABNORMAL LOW (ref >60)
GLUCOSE: 268 mg/dL — AB (ref 65–99)
POTASSIUM: 4.1 mmol/L (ref 3.5–5.1)
SODIUM: 141 mmol/L (ref 135–145)

## 2016-01-11 LAB — PROTIME-INR
INR: 1.13 (ref 0.00–1.49)
PROTHROMBIN TIME: 14.7 s (ref 11.6–15.2)

## 2016-01-11 MED ORDER — PANTOPRAZOLE SODIUM 40 MG PO TBEC
40.0000 mg | DELAYED_RELEASE_TABLET | Freq: Every day | ORAL | Status: DC
  Administered 2016-01-11 – 2016-01-17 (×6): 40 mg via ORAL
  Filled 2016-01-11 (×6): qty 1

## 2016-01-11 MED ORDER — FUROSEMIDE 10 MG/ML IJ SOLN
40.0000 mg | Freq: Every day | INTRAMUSCULAR | Status: DC
  Administered 2016-01-11 – 2016-01-16 (×6): 40 mg via INTRAVENOUS
  Filled 2016-01-11 (×6): qty 4

## 2016-01-11 NOTE — Progress Notes (Signed)
Blood sugar rechecked at 0200 as ordered was 255mg /dL, K. Schorr was notified. Will continue to monitor pt.

## 2016-01-11 NOTE — Progress Notes (Signed)
Occupational Therapy Treatment Patient Details Name: Eric [name] MRN: [id number] DOB: 08/14/1927 Today's Date: 01/11/2016    History of present illness 82 yo presented with 2 day history of generalized weakness which resulted in patient falling out of bed 2 days ago. Developed hip pain from this fall which persisted. History of hip fracture and surgical repair. patient has had new onset wheezing and coughing with occasional phlegm production. pt presents with A-flutter, COPD Exacerbation, and N/V.  pt with hx of AVR, CABG, HTN, DM, Shingles, and L THR.  Pt with A-fib with RVR. COPD/Bronchitis.    OT comments  Pt demonstrates ability to process using stedy for oob to chair transfer. Recommend stedy vs lift for RN staff (A) back to bed. Pt certainly on geo mat and will need position change in 1 hour to decr risk for skin break down. Pt thanking therapist at the end of session and asking therapist to stay longer. Pt smiled and very pleased with oob to chair this session.   Follow Up Recommendations  SNF    Equipment Recommendations  Hospital bed    Recommendations for Other Services      Precautions / Restrictions Precautions Precautions: Fall Precaution Comments: watch O2 Sats`       Mobility Bed Mobility Overal bed mobility: Needs Assistance Bed Mobility: Rolling;Supine to Sit Rolling: Mod assist   Supine to sit: Max assist;+2 for physical assistance     General bed mobility comments: but holding bed rail and releasing rail with cues to come into sitting. pt reports dizziness immediately with supine<>Sit. Pt required (A) at EOB until stedy placed. Pt with anterior weight shift with stedy in front of patient  Transfers Overall transfer level: Needs assistance   Transfers: Sit to/from Stand Sit to Stand: +2 physical assistance;Mod assist         General transfer comment: pt with pad placed at hips to help with hip extensino but second attempt pt completed transfer  without pad use. Pt using Bil UE on bar and pulling into standing. pt able to use bar to pull himself into sitting without support in chair with cues    Balance Overall balance assessment: Needs assistance Sitting-balance support: Bilateral upper extremity supported;Feet supported Sitting balance-Leahy Scale: Poor                             ADL Overall ADL's : Needs assistance/impaired     Grooming: Wash/dry hands;Moderate assistance;Sitting Grooming Details (indicate cue type and reason): pt in stedy at sink level with heavy lean on elbows inside the stedy. pt needed rest breaks                               General ADL Comments: pt transfered bed to chair with stedy. pt needed encouragement to keep progressing. Pt states "I can't" prior to any effort. Pt reports decr UB strength but observed BIL UE against gravity but when assessed was unable to lift against gravity. OT encouraged with patient with verbal cues "lets try and see what you can do " and pt continued to progress throughout the session      Vision                     Perception     Praxis      Cognition   Behavior During Therapy: [facility] for tasks assessed/performed Overall  Cognitive Status: Impaired/Different from baseline Area of Impairment: Awareness     Memory: Decreased short-term memory      Awareness: Emergent   General Comments: pt denies having a CVA. Pt states "they want to send me to [facility]. its only 6 miles from my house they can just come there instead" pt does not have awareness to SNF level therapy vs HHOT    Extremity/Trunk Assessment               Exercises Other Exercises Other Exercises: pt completed shoulder flexion x5 with high five to therapist bil UE then caught onto the exercises and terminated task. Pt states "i am just so weak I can't"   Shoulder Instructions       General Comments      Pertinent Vitals/ Pain       Pain Assessment:  Faces Faces Pain Scale: Hurts even more Pain Location: reports all over pain Pain Intervention(s): Monitored during session;Repositioned  Home Living                                          Prior Functioning/Environment              Frequency Min 2X/week     Progress Toward Goals  OT Goals(current goals can now be found in the care plan section)  Progress towards OT goals: Progressing toward goals  Acute Rehab OT Goals Patient Stated Goal: Go home with my son OT Goal Formulation: With patient Time For Goal Achievement: 01/14/16 Potential to Achieve Goals: Good ADL Goals Pt Will Perform Eating: with set-up;sitting Pt Will Perform Grooming: with set-up;with supervision;sitting Pt Will Perform Upper Body Bathing: with set-up;with supervision;sitting Pt Will Transfer to Toilet: with min assist;with +2 assist;bedside commode Additional ADL Goal #1: bed mobility with min A +2 in preparation for ADL  Plan Discharge plan remains appropriate    Co-evaluation                 End of Session Equipment Utilized During Treatment: Oxygen;Gait belt   Activity Tolerance Patient tolerated treatment well   Patient Left in chair;with call bell/phone within reach;with chair alarm set   Nurse Communication Mobility status;Precautions;Need for lift equipment        Time: 1131-1155 OT Time Calculation (min): 24 min  Charges: OT General Charges $OT Visit: 1 Procedure OT Treatments $Self Care/Home Management : 8-22 mins $Therapeutic Activity: 8-22 mins  Parke Poisson B 01/11/2016, 2:17 PM    Jeri Modena   OTR/L Pager: [phone] Office: [phone] .

## 2016-01-11 NOTE — Progress Notes (Signed)
Physical Therapy Treatment Patient Details Name: Eric [name] MRN: [id number] DOB: Apr 02, 1927 Today's Date: 01/11/2016    History of Present Illness 82 yo presented with 2 day history of generalized weakness which resulted in patient falling out of bed 2 days ago. Developed hip pain from this fall which persisted. History of hip fracture and surgical repair. patient has had new onset wheezing and coughing with occasional phlegm production. pt presents with A-flutter, COPD Exacerbation, and N/V.  pt with hx of AVR, CABG, HTN, DM, Shingles, and L THR.  Pt with A-fib with RVR. COPD/Bronchitis.     PT Comments    Pt OOB in recliner on O2 with son in room.  Performed B LE TE's AAROM due to fatigue/weakness.  Rest breaks between each due to noted 2/4 DOE.  Pt declined to get back to bed at this time and was comfortable in chair.  [employer] lift pad under pt and reported to Advertising copywriter that may need to use use Hoyer Lift to get pt from lower level recliner back to bed.    Follow Up Recommendations  SNF ([place] )     Equipment Recommendations       Recommendations for Other Services       Precautions / Restrictions Precautions Precautions: Fall Precaution Comments: watch O2 Sats` Restrictions Weight Bearing Restrictions: No    Mobility  Bed Mobility   General bed mobility comments: pt OOB in recliner via steady earlier today                    Stairs            Wheelchair Mobility    Modified Rankin (Stroke Patients Only)       Balance Overall balance assessment: Needs assistance Sitting-balance support: Bilateral upper extremity supported;Feet supported Sitting balance-Leahy Scale: Poor                              Cognition Arousal/Alertness: Awake/alert Behavior During Therapy: WFL for tasks assessed/performed Overall Cognitive Status: Impaired/Different from baseline Area of Impairment: Awareness     Memory: Decreased  short-term memory     Awareness: Emergent     Exercises B LE AP 20 reps B LE knee presses 10 reps B LE ADd towel squeeze 10 reps B LE heel slides AAROM 10 reps B LE ABd AAROM x 10 reps  General Comments        Pertinent Vitals/Pain Pain Assessment: Faces Faces Pain Scale: Hurts little more Pain Location: "all over" Pain Descriptors / Indicators: Aching Pain Intervention(s): Monitored during session;Repositioned    Home Living                      Prior Function            PT Goals (current goals can now be found in the care plan section) Acute Rehab PT Goals Patient Stated Goal: Go home with my son Progress towards PT goals: Progressing toward goals    Frequency  Min 2X/week    PT Plan Current plan remains appropriate    Co-evaluation             End of Session Equipment Utilized During Treatment: Gait belt Activity Tolerance: Patient limited by fatigue Patient left: with chair alarm set;with call bell/phone within reach;in chair     Time: 1415-1425 PT Time Calculation (min) (ACUTE ONLY): 10 min  Charges:  $Therapeutic  Exercise: 8-22 mins                    G Codes:      Rica Koyanagi  PTA [facility]  Acute  Rehab Pager      [phone]

## 2016-01-11 NOTE — Progress Notes (Signed)
Pt discussed in LOS meeting on 3/23- CSW will continue to follow pt for DC to SNF when medically stable  Domenica Reamer, [place] Social Worker [phone]

## 2016-01-11 NOTE — Progress Notes (Signed)
PROGRESS NOTE  Eric [name] QIW:[id number] DOB: July 30, 1927 DOA: 12/25/2015 PCP: [name], MD  Admit HPI / Brief Narrative: 82 y.o. male who c/o a 2 day history of generalized weakness which lead to him falling out of bed 2 days prior. He developed hip pain from this fall. He also noted the new onset wheezing and coughing with occasional phlegm production, and a runny nose w/ upper respiratory congestion. He also noted a very poor appetite with very little oral intake.   He came to the ER and was found to be tachycardic (had P waves - was irregular - possible MAT).  He was treated with nebs with improvement of resp failure and tightness. His HR continued to climb to 135. He was given Cardizem 20 mg with BP dropping after. He was given a fluid bolus, and a dilt drip at 5 mg was begun.   Patient admitted with encephalopathy, SIRS, respiratory failure thought to be secondary to Heart failure exacerbation and presume COPD exacerbation. He was found to be in A fib RVR. He received Treatment for HF, and COPD exacerbation. His Mental status improved until 3-19 when he spike fever, and was notice to be disarthric by family. He underwent MRI which was positive for multiple acute stroke. Also blood culture are positive for Strep B agalactiae. Chest x ray positive for PNA>   Plan is to proceed for TEE 3-22. ID will see patient in consultation. Anticoagulation will need to be revisited after TEE results. Follow neurology recommendation regarding anticoagulation.   HPI/Subjective: Less Wheezing, report feeling tired, denies pain, son in room  Assessment/Plan: SIRS, Bacteremia, PNA: Strep Bacteremia, Agalactiae.  On admission patient met guidelines for SIRS w/ HR> 90, RR> 20, WBC> 12 - no infiltrate on CXR - no + culture results - UA not c/w UTI . LFT normal  Spike fever 3-19, Chest X ray: mild posterior left lower infiltrates.  abx history since admission: levaquin 3/7 > 3/15 vanc on 3/19  and 20 Zosyn 3/19-3/21 ampicillin from 3/21 May need to place IJ for long term abx use  Planned for TEE 3-22 cancelled due to hypoxia with sedation, need to reschedule once respiratory status improves to rule out endocarditis. Patient with history of Aortic bioprosthetic valve replacement, Blood culture 3-19 growing: strep B agalactiae. Repeat blood culture from 3/21 pending, appreciate ID input  Acute stroke, multiple infarcts;  Likely embolic, in setting of A fib but also Bacteremia. Per nueorlogy anticoagulation only after endocarditis ruled out.  Currently on asa 376m qd, Appreciate neurology input, per neuro: no anticoagulation until rule out infective endocarditis   Acute COPD exac - acute hypoxic resp failure  Wheezing on 3/22, repeat cxr with ? pulmonary edema,   schedule nebulizer, add mucinex, trial of lasix,  3/23 less wheezing, taper steroids 3/24 wheezing almost resolved.  A fib w/ RVR , CAHADSvasc score 7 ( age/htn/chf/dm2/cva) Cardiology following - IV cardizem now transitioned to oral - TSH normal - high risk for anticoag given recent falls - Cards suggests ASA 325 for now and reconsider anticaog in outpt f/u - no amio due to severe lung disease  Continue with Bisoprolol. cardizem , she was on dig briefly, this was stopped on 3/18 by cardiology. Discussed wi-th family 3-20, patient prior to admission was active. He fell prior to admission because he was sick. Prior to admission , he used to go to grocery store, restaurants with assistance of son. They understand risk for bleeding and with risk of falling.  Patient will be discharge to SNF. They agree with coumadin. This will need to be reevaluate after TEE results. Needs to follow up Neurology recommendations.    Acute mental status change -Multifactorial to include SIRS, Ativan, steroids, acute on chronic renal failure, sundowning  -CT head unrevealing initially  -Worsening MS 3-19, MRI positive for stroke. Spike fever  100.5 on 3-19. (last fever) aaox3 on 3/24   Dysphagia Cleared for D1 diet w/ thin liquids per SLP initially , continue aspiration precaution. Patient want to eat, report tired of eating puree diet. Nutrition consulted, continue speech therapy, now on soft diet and thin liquid  Insulin dependent DM2 - acutely severely uncontrolled  A1c 7.1 - required insulin gtt earlier in stay - CBG reasonably controlled - follow w/o change today  Increase insulin in the setting of increase steroids  N/V - resolved  KUB noted gaseous distention of stomach on BIPAP - suspect bloating was mostly air blown in w/ BIPAP - simethicone - resolved   Constipation; better  Acute on CKD Stage 3 Baseline creatinine 1.6 - creatinine at presentation 2.58 w/ peak of 3.12 - avoid ACE/ARB - crt now at ~baseline  [facility] Weights   01/09/16 0542 01/10/16 0504 01/11/16 0634  Weight: 91.4 kg (201 lb 8 oz) 91.989 kg (202 lb 12.8 oz) 92.761 kg (204 lb 8 oz)     Diuretics held for a few days, restarted on 3/23 due to sign of pulmonary edema on cxr , monitor cr  Acute Diastolic HF;  Good urine out put.  Cardiology managing  Holding torsemide due to increase cr. Diuretics restarted on 3/23  Hyponatremia Appears to have been due to hypovolemia and hyperglycemia - resolved   Coronary artery disease status post CABG x3 2010 Cont ASA + lipid med  Aortic stenosis status post valve replacement 2010 Pericardial tissue valve stable on TTE   Carotid artery stenosis  Dopplers May 2015 showed a 60-79% right and 1-39% left stenosis  Hyperlipidemia Cont med tx  GERD  HTN Not an active issue at this time   Code Status: DO NOT RESUSCITATE Family Communication: son at bedside Disposition Plan: per cardiology, TEE likely early next week  Consultants: Ayden  Neurology ID  Procedures: 3/9 TTE - EF 60-65% - Aortic valve: A bioprosthesis was present and functioning normally - Mitral valve: severe stenosis. Mean  gradient (D): 11 mm Hg - Right ventricle: size was normal. Wall thickness normal. Systolic function was normal - Tricuspid valve: trivial regurgitation.  TEE pending  Antibiotics: levaquin 3/7 > 3/15 vanc on 3/19 and 20 Zosyn 3/19-3/21 ampicillin from 3/21  DVT prophylaxis: Subcutaneous heparin  Objective: Blood pressure 113/57, pulse 91, temperature 98.1 F (36.7 C), temperature source Oral, resp. rate 26, height _0  (1.753 m), weight 92.761 kg (204 lb 8 oz), SpO2 97 %.  Intake/Output Summary (Last 24 hours) at 01/11/16 1540 Last data filed at 01/11/16 1429  Gross per 24 hour  Intake   1340 ml  Output   2210 ml  Net   -870 ml   Filed Weights   01/09/16 0542 01/10/16 0504 01/11/16 0634  Weight: 91.4 kg (201 lb 8 oz) 91.989 kg (202 lb 12.8 oz) 92.761 kg (204 lb 8 oz)   Exam: General: pleasant , aaox3.  Lungs: less bilateral wheezing  Cardiovascular: IRRR Abdomen: NT/ND, soft, bs+, no mass, no rebound  Extremities: no significant cyanosis, or clubbing - 2+ edema bilateral lower extremities documented before has much improved, only trace edema on  3/22 Neuro: aaox3, pleasant, no focal weakness, ? Slurred speech  Data Reviewed:  Basic Metabolic Panel:  Recent Labs Lab 01/07/16 0214 01/08/16 0252 01/09/16 0608 01/10/16 0300 01/11/16 0440  NA 139 140 140 139 141  K 3.8 3.7 3.3* 4.2 4.1  CL 98* 98* 99* 100* 102  CO2 _0 GLUCOSE 339* 307* 214* 381* 268*  BUN 47* 48* 39* 36* 37*  CREATININE 2.84* 2.62* 2.10* 1.98* 2.21*  CALCIUM 7.9* 7.7* 8.0* 8.0* 8.2*  MG  --   --   --  1.7  --     CBC:  Recent Labs Lab 01/05/16 0352 01/07/16 0214 01/08/16 0252 01/10/16 0300  WBC 12.6* 11.5* 6.7 3.0*  HGB 10.0* 9.3* 8.5* 9.4*  HCT 31.6* 29.5* 27.5* 29.3*  MCV 93.2 93.1 94.2 94.2  PLT 255 176 134* 135*   Liver Function Tests:  Recent Labs Lab 01/06/16 1736  AST 22  ALT 19  ALKPHOS 48  BILITOT 1.2  PROT 5.6*  ALBUMIN 2.4*   CBG:  Recent  Labs Lab 01/10/16 2043 01/10/16 2235 01/11/16 0218 01/11/16 0554 01/11/16 1216  GLUCAP 487* 408* 255* 300* 325*    Recent Results (from the past 240 hour(s))  Culture, blood (routine x 2)     Status: None   Collection Time: 01/06/16  5:19 PM  Result Value Ref Range Status   Specimen Description BLOOD RIGHT ARM  Final   Special Requests   Final    BOTTLES DRAWN AEROBIC AND ANAEROBIC 6CC BLUE 5CC PURPLE   Culture  Setup Time   Final    IN BOTH AEROBIC AND ANAEROBIC BOTTLES GRAM POSITIVE COCCI CRITICAL RESULT CALLED TO, READ BACK BY AND VERIFIED WITH: N.WILEY,RN [id number] 01/07/16 M.CAMPBELL    Culture   Final    GROUP B STREP(S.AGALACTIAE)ISOLATED SUSCEPTIBILITIES PERFORMED ON PREVIOUS CULTURE WITHIN THE LAST 5 DAYS.    Report Status 01/09/2016 FINAL  Final  Culture, blood (routine x 2)     Status: None   Collection Time: 01/06/16  5:28 PM  Result Value Ref Range Status   Specimen Description BLOOD LEFT ARM  Final   Special Requests BOTTLES DRAWN AEROBIC AND ANAEROBIC 10CC  Final   Culture  Setup Time   Final    IN BOTH AEROBIC AND ANAEROBIC BOTTLES GRAM POSITIVE COCCI IN CHAINS IN CLUSTERS CRITICAL RESULT CALLED TO, READ BACK BY AND VERIFIED WITH: G.RASUL,RN [id number] 01/07/16 M.CAMPBELL    Culture GROUP B STREP(S.AGALACTIAE)ISOLATED  Final   Report Status 01/09/2016 FINAL  Final   Organism ID, Bacteria GROUP B STREP(S.AGALACTIAE)ISOLATED  Final      Susceptibility   Group b strep(s.agalactiae)isolated - MIC*    CLINDAMYCIN >=1 RESISTANT Resistant     AMPICILLIN <=0.25 SENSITIVE Sensitive     ERYTHROMYCIN >=8 RESISTANT Resistant     VANCOMYCIN 0.5 SENSITIVE Sensitive     CEFTRIAXONE <=0.12 SENSITIVE Sensitive     LEVOFLOXACIN 4 INTERMEDIATE Intermediate     * GROUP B STREP(S.AGALACTIAE)ISOLATED  Urine culture     Status: None   Collection Time: 01/07/16 12:50 AM  Result Value Ref Range Status   Specimen Description URINE, RANDOM  Final   Special Requests NONE  Final    Culture MULTIPLE SPECIES PRESENT, SUGGEST RECOLLECTION  Final   Report Status 01/08/2016 FINAL  Final  Culture, blood (routine x 2)     Status: None (Preliminary result)   Collection Time: 01/08/16  3:27 PM  Result Value Ref Range Status  Specimen Description BLOOD LEFT ANTECUBITAL  Final   Special Requests BOTTLES DRAWN AEROBIC AND ANAEROBIC 5CC EACH  Final   Culture NO GROWTH 3 DAYS  Final   Report Status PENDING  Incomplete  Culture, blood (routine x 2)     Status: None (Preliminary result)   Collection Time: 01/08/16  3:33 PM  Result Value Ref Range Status   Specimen Description BLOOD RIGHT HAND  Final   Special Requests BOTTLES DRAWN AEROBIC ONLY 5CC  Final   Culture NO GROWTH 3 DAYS  Final   Report Status PENDING  Incomplete    Studies:   Recent x-ray studies have been reviewed in detail by the Attending Physician  Scheduled Meds:  Scheduled Meds: . ampicillin (OMNIPEN) IV  2 g Intravenous 4 times per day  . antiseptic oral rinse  7 mL Mouth Rinse BID  . aspirin  324 mg Oral Daily  . atorvastatin  20 mg Oral q1800  . bisoprolol  2.5 mg Oral Daily  . budesonide (PULMICORT) nebulizer solution  0.25 mg Nebulization BID  . diltiazem  120 mg Oral 3 times per day  . feeding supplement (GLUCERNA SHAKE)  237 mL Oral TID BM  . furosemide  40 mg Intravenous Daily  . guaiFENesin  600 mg Oral BID  . heparin subcutaneous  5,000 Units Subcutaneous 3 times per day  . insulin aspart  0-20 Units Subcutaneous TID WC  . insulin aspart  0-5 Units Subcutaneous QHS  . insulin aspart  4 Units Subcutaneous TID WC  . insulin glargine  30 Units Subcutaneous Daily  . ipratropium  0.5 mg Nebulization TID  . levalbuterol  0.63 mg Nebulization TID  . multivitamin with minerals  1 tablet Oral Daily  . pantoprazole  40 mg Oral Daily  . polyethylene glycol  17 g Oral BID  . predniSONE  50 mg Oral Q breakfast  . sodium chloride flush  3 mL Intravenous Q12H    Time spent on care of this patient:  15 mins   Jolayne Branson , MD PhD [phone]  [facility] Office  [phone] Pager - Text Page per Amion as per below:  On-Call/Text Page:      [email or web address]      password [email or web address]  If 7PM-7AM, please contact night-coverage [email or web address] Password [email or web address] 01/11/2016, 3:40 PM   LOS: 17 days
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
## 2016-01-11 NOTE — Progress Notes (Signed)
[facility]   Reason for visit: Follow up on GBS bacteremia  Interval History:  afebrile, repeat blood cultures ngtd  Physical Exam: Constitutional:  Filed Vitals:   01/11/16 1200 01/11/16 1443  BP: 115/67 113/57  Pulse: 91   Temp: 98.1 F (36.7 C)   Resp:     patient appears in NAD Respiratory: Normal respiratory effort; CTA B Cardiovascular: RRR  Review of Systems: Constitutional: negative for fevers and chills Cardiovascular: negative for chest pain Integument/breast: negative for rash  Lab Results  Component Value Date   WBC 3.0* 01/10/2016   HGB 9.4* 01/10/2016   HCT 29.3* 01/10/2016   MCV 94.2 01/10/2016   PLT 135* 01/10/2016    Lab Results  Component Value Date   CREATININE 2.21* 01/11/2016   BUN 37* 01/11/2016   NA 141 01/11/2016   K 4.1 01/11/2016   CL 102 01/11/2016   CO2 29 01/11/2016    Lab Results  Component Value Date   ALT 19 01/06/2016   AST 22 01/06/2016   ALKPHOS 48 01/06/2016     Microbiology: Recent Results (from the past 240 hour(s))  Culture, blood (routine x 2)     Status: None   Collection Time: 01/06/16  5:19 PM  Result Value Ref Range Status   Specimen Description BLOOD RIGHT ARM  Final   Special Requests   Final    BOTTLES DRAWN AEROBIC AND ANAEROBIC 6CC BLUE 5CC PURPLE   Culture  Setup Time   Final    IN BOTH AEROBIC AND ANAEROBIC BOTTLES GRAM POSITIVE COCCI CRITICAL RESULT CALLED TO, READ BACK BY AND VERIFIED WITH: N.WILEY,RN [id number] 01/07/16 M.CAMPBELL    Culture   Final    GROUP B STREP(S.AGALACTIAE)ISOLATED SUSCEPTIBILITIES PERFORMED ON PREVIOUS CULTURE WITHIN THE LAST 5 DAYS.    Report Status 01/09/2016 FINAL  Final  Culture, blood (routine x 2)     Status: None   Collection Time: 01/06/16  5:28 PM  Result Value Ref Range Status   Specimen Description BLOOD LEFT ARM  Final   Special Requests BOTTLES DRAWN AEROBIC AND ANAEROBIC 10CC  Final   Culture  Setup Time   Final    IN BOTH  AEROBIC AND ANAEROBIC BOTTLES GRAM POSITIVE COCCI IN CHAINS IN CLUSTERS CRITICAL RESULT CALLED TO, READ BACK BY AND VERIFIED WITH: G.RASUL,RN [id number] 01/07/16 M.CAMPBELL    Culture GROUP B STREP(S.AGALACTIAE)ISOLATED  Final   Report Status 01/09/2016 FINAL  Final   Organism ID, Bacteria GROUP B STREP(S.AGALACTIAE)ISOLATED  Final      Susceptibility   Group b strep(s.agalactiae)isolated - MIC*    CLINDAMYCIN >=1 RESISTANT Resistant     AMPICILLIN <=0.25 SENSITIVE Sensitive     ERYTHROMYCIN >=8 RESISTANT Resistant     VANCOMYCIN 0.5 SENSITIVE Sensitive     CEFTRIAXONE <=0.12 SENSITIVE Sensitive     LEVOFLOXACIN 4 INTERMEDIATE Intermediate     * GROUP B STREP(S.AGALACTIAE)ISOLATED  Urine culture     Status: None   Collection Time: 01/07/16 12:50 AM  Result Value Ref Range Status   Specimen Description URINE, RANDOM  Final   Special Requests NONE  Final   Culture MULTIPLE SPECIES PRESENT, SUGGEST RECOLLECTION  Final   Report Status 01/08/2016 FINAL  Final  Culture, blood (routine x 2)     Status: None (Preliminary result)   Collection Time: 01/08/16  3:27 PM  Result Value Ref Range Status   Specimen Description BLOOD LEFT ANTECUBITAL  Final   Special Requests BOTTLES DRAWN AEROBIC  AND ANAEROBIC 5CC EACH  Final   Culture NO GROWTH 3 DAYS  Final   Report Status PENDING  Incomplete  Culture, blood (routine x 2)     Status: None (Preliminary result)   Collection Time: 01/08/16  3:33 PM  Result Value Ref Range Status   Specimen Description BLOOD RIGHT HAND  Final   Special Requests BOTTLES DRAWN AEROBIC ONLY 5CC  Final   Culture NO GROWTH 3 DAYS  Final   Report Status PENDING  Incomplete    Impression/Plan:  1. GBS bacteremia in someone with PV - ideal to get TEE.  Other option would be to for a proloned duration of 6 weeks Ok to place picc if needed, but will defer to Dr. Erlinda Hong what access would be most appropriate with renal insufficiency  2. Renal insufficiency - stable, mild elevation  in creat from yesterday.   Dr. Megan Salon is available over the weekend if needed, otherwise I will follow up on Monday

## 2016-01-12 LAB — GLUCOSE, CAPILLARY
GLUCOSE-CAPILLARY: 273 mg/dL — AB (ref 65–99)
Glucose-Capillary: 224 mg/dL — ABNORMAL HIGH (ref 65–99)
Glucose-Capillary: 257 mg/dL — ABNORMAL HIGH (ref 65–99)
Glucose-Capillary: 276 mg/dL — ABNORMAL HIGH (ref 65–99)

## 2016-01-12 LAB — BASIC METABOLIC PANEL
Anion gap: 10 (ref 5–15)
BUN: 40 mg/dL — AB (ref 6–20)
CALCIUM: 7.7 mg/dL — AB (ref 8.9–10.3)
CHLORIDE: 98 mmol/L — AB (ref 101–111)
CO2: 27 mmol/L (ref 22–32)
CREATININE: 2.19 mg/dL — AB (ref 0.61–1.24)
GFR, EST AFRICAN AMERICAN: 29 mL/min — AB (ref >60)
GFR, EST NON AFRICAN AMERICAN: 25 mL/min — AB (ref >60)
Glucose, Bld: 242 mg/dL — ABNORMAL HIGH (ref 65–99)
Potassium: 3.9 mmol/L (ref 3.5–5.1)
SODIUM: 135 mmol/L (ref 135–145)

## 2016-01-12 LAB — PROTIME-INR
INR: 1.1 (ref 0.00–1.49)
PROTHROMBIN TIME: 14.4 s (ref 11.6–15.2)

## 2016-01-12 NOTE — Progress Notes (Signed)
PROGRESS NOTE  Eric [name] WUJ:[id number] DOB: [birth year] DOA: 12/25/2015 PCP: [name], MD  Admit HPI / Brief Narrative: 82 y.o. male who c/o a 2 day history of generalized weakness which lead to him falling out of bed 2 days prior. He developed hip pain from this fall. He also noted the new onset wheezing and coughing with occasional phlegm production, and a runny nose w/ upper respiratory congestion. He also noted a very poor appetite with very little oral intake.   He came to the ER and was found to be tachycardic (had P waves - was irregular - possible MAT).  He was treated with nebs with improvement of resp failure and tightness. His HR continued to climb to 135. He was given Cardizem 20 mg with BP dropping after. He was given a fluid bolus, and a dilt drip at 5 mg was begun.   Patient admitted with encephalopathy, SIRS, respiratory failure thought to be secondary to Heart failure exacerbation and presume COPD exacerbation. He was found to be in A fib RVR. He received Treatment for HF, and COPD exacerbation. His Mental status improved until 3-19 when he spike fever, and was notice to be disarthric by family. He underwent MRI which was positive for multiple acute stroke. Also blood culture are positive for Strep B agalactiae. Chest x ray positive for PNA>   Plan is to proceed for TEE 3-22. ID will see patient in consultation. Anticoagulation will need to be revisited after TEE results. Follow neurology recommendation regarding anticoagulation.   HPI/Subjective: Less Wheezing, report feeling tired, denies pain, son in room  Assessment/Plan: SIRS, Bacteremia, PNA: Strep Bacteremia, Agalactiae.  On admission patient met guidelines for SIRS w/ HR> 90, RR> 20, WBC> 12 - no infiltrate on CXR - no + culture results - UA not c/w UTI . LFT normal  Spike fever 3-19, Chest X ray: mild posterior left lower infiltrates.  abx history since admission: levaquin 3/7 > 3/15 vanc on 3/19  and 20 Zosyn 3/19-3/21 ampicillin from 3/21 May need to place IJ for long term abx use  Planned for TEE 3-22 cancelled due to hypoxia with sedation, need to reschedule once respiratory status improves to rule out endocarditis. Patient with history of Aortic bioprosthetic valve replacement, Blood culture 3-19 growing: strep B agalactiae. Repeat blood culture from 3/21 pending, appreciate ID input Per neurology  Dr. Leonie [name] recommendation on 3/23 "aspirin for now and anticoagulation only after endocarditis ruled out"  Acute stroke, multiple infarcts;  Likely embolic, in setting of A fib but also Bacteremia. Per nueorlogy anticoagulation only after endocarditis ruled out.  Currently on asa 333m qd, Appreciate neurology input, per neuro: no anticoagulation until rule out infective endocarditis   Acute COPD exac - acute hypoxic resp failure  Wheezing on 3/22, repeat cxr with ? pulmonary edema,   schedule nebulizer, add mucinex, trial of lasix,  3/23 less wheezing, taper steroids 3/24 wheezing almost resolved. 3/25 continue improving, no obvious wheezing on exam  A fib w/ RVR , CAHADSvasc score 7 ( age/htn/chf/dm2/cva) Cardiology following - IV cardizem now transitioned to oral - TSH normal - high risk for anticoag given recent falls - Cards suggests ASA 325 for now and reconsider anticaog in outpt f/u - no amio due to severe lung disease  Continue with Bisoprolol. cardizem , she was on dig briefly, this was stopped on 3/18 by cardiology. Discussed wi-th family 3-20, patient prior to admission was active. He fell prior to admission because he was sick.  Prior to admission , he used to go to grocery store, restaurants with assistance of son. They understand risk for bleeding and with risk of falling. Patient will be discharge to SNF. They agree with coumadin. This will need to be reevaluate after TEE results. Needs to follow up Neurology recommendations.    Acute mental status change -Multifactorial  to include SIRS, Ativan, steroids, acute on chronic renal failure, sundowning  -CT head unrevealing initially  -Worsening MS 3-19, MRI positive for stroke. Spike fever 100.5 on 3-19. (last fever) aaox3 on 3/24   Dysphagia Cleared for D1 diet w/ thin liquids per SLP initially , continue aspiration precaution. Patient want to eat, report tired of eating puree diet. Nutrition consulted, continue speech therapy, now on soft diet and thin liquid  Insulin dependent DM2 - acutely severely uncontrolled  A1c 7.1 - required insulin gtt earlier in stay - CBG reasonably controlled - follow w/o change today  Increase insulin in the setting of increase steroids  N/V - resolved  KUB noted gaseous distention of stomach on BIPAP - suspect bloating was mostly air blown in w/ BIPAP - simethicone - resolved   Constipation; better  Acute on CKD Stage 3 Baseline creatinine 1.6 - creatinine at presentation 2.58 w/ peak of 3.12 - avoid ACE/ARB - crt now at ~baseline  Filed Weights   01/10/16 0504 01/11/16 0634 01/12/16 0730  Weight: 91.989 kg (202 lb 12.8 oz) 92.761 kg (204 lb 8 oz) 92.897 kg (204 lb 12.8 oz)     Diuretics held for a few days, restarted on 3/23 due to sign of pulmonary edema on cxr , monitor cr  Acute Diastolic HF;  Good urine out put.  Cardiology managing  Holding torsemide due to increase cr. Diuretics restarted on 3/23  Hyponatremia Appears to have been due to hypovolemia and hyperglycemia - resolved   Coronary artery disease status post CABG x3 2010 Cont ASA + lipid med  Aortic stenosis status post valve replacement 2010 Pericardial tissue valve stable on TTE   Carotid artery stenosis  Dopplers May 2015 showed a 60-79% right and 1-39% left stenosis  Hyperlipidemia Cont med tx  GERD  HTN Not an active issue at this time   Code Status: DO NOT RESUSCITATE Family Communication: son at bedside Disposition Plan: TEE per neurology request, per cardiology, TEE likely early  next week  Consultants: [place]  Neurology ID  Procedures: 3/9 TTE - EF 60-65% - Aortic valve: A bioprosthesis was present and functioning normally - Mitral valve: severe stenosis. Mean gradient (D): 11 mm Hg - Right ventricle: size was normal. Wall thickness normal. Systolic function was normal - Tricuspid valve: trivial regurgitation.  TEE pending  Antibiotics: levaquin 3/7 > 3/15 vanc on 3/19 and 20 Zosyn 3/19-3/21 ampicillin from 3/21  DVT prophylaxis: Subcutaneous heparin  Objective: Blood pressure 106/54, pulse 77, temperature 98 F (36.7 C), temperature source Oral, resp. rate 18, height 5' 9" (1.753 m), weight 92.897 kg (204 lb 12.8 oz), SpO2 94 %.  Intake/Output Summary (Last 24 hours) at 01/12/16 1421 Last data filed at 01/12/16 1300  Gross per 24 hour  Intake   1360 ml  Output   2135 ml  Net   -775 ml   Filed Weights   01/10/16 0504 01/11/16 0634 01/12/16 0730  Weight: 91.989 kg (202 lb 12.8 oz) 92.761 kg (204 lb 8 oz) 92.897 kg (204 lb 12.8 oz)   Exam: General: pleasant , aaox3.  Lungs: less bilateral wheezing  Cardiovascular: [facility]  Abdomen: NT/ND, soft, bs+, no mass, no rebound  Extremities: no significant cyanosis, or clubbing - 2+ edema bilateral lower extremities documented before has much improved, only trace edema on 3/22 Neuro: aaox3, pleasant, no focal weakness, ? Slurred speech, baseline?  Data Reviewed:  Basic Metabolic Panel:  Recent Labs Lab 01/08/16 0252 01/09/16 0608 01/10/16 0300 01/11/16 0440 01/12/16 0302  NA 140 140 139 141 135  K 3.7 3.3* 4.2 4.1 3.9  CL 98* 99* 100* 102 98*  CO2 _0 GLUCOSE 307* 214* 381* 268* 242*  BUN 48* 39* 36* 37* 40*  CREATININE 2.62* 2.10* 1.98* 2.21* 2.19*  CALCIUM 7.7* 8.0* 8.0* 8.2* 7.7*  MG  --   --  1.7  --   --     CBC:  Recent Labs Lab 01/07/16 0214 01/08/16 0252 01/10/16 0300  WBC 11.5* 6.7 3.0*  HGB 9.3* 8.5* 9.4*  HCT 29.5* 27.5* 29.3*  MCV 93.1 94.2 94.2  PLT  176 134* 135*   Liver Function Tests:  Recent Labs Lab 01/06/16 1736  AST 22  ALT 19  ALKPHOS 48  BILITOT 1.2  PROT 5.6*  ALBUMIN 2.4*   CBG:  Recent Labs Lab 01/11/16 1216 01/11/16 1647 01/11/16 2059 01/12/16 0603 01/12/16 1109  GLUCAP 325* 345* 228* 224* 257*    Recent Results (from the past 240 hour(s))  Culture, blood (routine x 2)     Status: None   Collection Time: 01/06/16  5:19 PM  Result Value Ref Range Status   Specimen Description BLOOD RIGHT ARM  Final   Special Requests   Final    BOTTLES DRAWN AEROBIC AND ANAEROBIC 6CC BLUE 5CC PURPLE   Culture  Setup Time   Final    IN BOTH AEROBIC AND ANAEROBIC BOTTLES GRAM POSITIVE COCCI CRITICAL RESULT CALLED TO, READ BACK BY AND VERIFIED WITH: N.WILEY,RN [id number] 01/07/16 M.CAMPBELL    Culture   Final    GROUP B STREP(S.AGALACTIAE)ISOLATED SUSCEPTIBILITIES PERFORMED ON PREVIOUS CULTURE WITHIN THE LAST 5 DAYS.    Report Status 01/09/2016 FINAL  Final  Culture, blood (routine x 2)     Status: None   Collection Time: 01/06/16  5:28 PM  Result Value Ref Range Status   Specimen Description BLOOD LEFT ARM  Final   Special Requests BOTTLES DRAWN AEROBIC AND ANAEROBIC 10CC  Final   Culture  Setup Time   Final    IN BOTH AEROBIC AND ANAEROBIC BOTTLES GRAM POSITIVE COCCI IN CHAINS IN CLUSTERS CRITICAL RESULT CALLED TO, READ BACK BY AND VERIFIED WITH: G.RASUL,RN [id number] 01/07/16 M.CAMPBELL    Culture GROUP B STREP(S.AGALACTIAE)ISOLATED  Final   Report Status 01/09/2016 FINAL  Final   Organism ID, Bacteria GROUP B STREP(S.AGALACTIAE)ISOLATED  Final      Susceptibility   Group b strep(s.agalactiae)isolated - MIC*    CLINDAMYCIN >=1 RESISTANT Resistant     AMPICILLIN <=0.25 SENSITIVE Sensitive     ERYTHROMYCIN >=8 RESISTANT Resistant     VANCOMYCIN 0.5 SENSITIVE Sensitive     CEFTRIAXONE <=0.12 SENSITIVE Sensitive     LEVOFLOXACIN 4 INTERMEDIATE Intermediate     * GROUP B STREP(S.AGALACTIAE)ISOLATED  Urine culture      Status: None   Collection Time: 01/07/16 12:50 AM  Result Value Ref Range Status   Specimen Description URINE, RANDOM  Final   Special Requests NONE  Final   Culture MULTIPLE SPECIES PRESENT, SUGGEST RECOLLECTION  Final   Report Status 01/08/2016 FINAL  Final  Culture, blood (routine x 2)  Status: None (Preliminary result)   Collection Time: 01/08/16  3:27 PM  Result Value Ref Range Status   Specimen Description BLOOD LEFT ANTECUBITAL  Final   Special Requests BOTTLES DRAWN AEROBIC AND ANAEROBIC 5CC EACH  Final   Culture NO GROWTH 4 DAYS  Final   Report Status PENDING  Incomplete  Culture, blood (routine x 2)     Status: None (Preliminary result)   Collection Time: 01/08/16  3:33 PM  Result Value Ref Range Status   Specimen Description BLOOD RIGHT HAND  Final   Special Requests BOTTLES DRAWN AEROBIC ONLY 5CC  Final   Culture NO GROWTH 4 DAYS  Final   Report Status PENDING  Incomplete    Studies:   Recent x-ray studies have been reviewed in detail by the Attending Physician  Scheduled Meds:  Scheduled Meds: . ampicillin (OMNIPEN) IV  2 g Intravenous 4 times per day  . antiseptic oral rinse  7 mL Mouth Rinse BID  . aspirin  324 mg Oral Daily  . atorvastatin  20 mg Oral q1800  . bisoprolol  2.5 mg Oral Daily  . budesonide (PULMICORT) nebulizer solution  0.25 mg Nebulization BID  . diltiazem  120 mg Oral 3 times per day  . feeding supplement (GLUCERNA SHAKE)  237 mL Oral TID BM  . furosemide  40 mg Intravenous Daily  . guaiFENesin  600 mg Oral BID  . heparin subcutaneous  5,000 Units Subcutaneous 3 times per day  . insulin aspart  0-20 Units Subcutaneous TID WC  . insulin aspart  0-5 Units Subcutaneous QHS  . insulin aspart  4 Units Subcutaneous TID WC  . insulin glargine  30 Units Subcutaneous Daily  . levalbuterol  0.63 mg Nebulization TID  . multivitamin with minerals  1 tablet Oral Daily  . pantoprazole  40 mg Oral Daily  . polyethylene glycol  17 g Oral BID  .  predniSONE  50 mg Oral Q breakfast  . sodium chloride flush  3 mL Intravenous Q12H    Time spent on care of this patient: 25 mins   Latasha Buczkowski , MD PhD [phone]  [facility] Office  [phone] Pager - Text Page per Amion as per below:  On-Call/Text Page:      [email or web address]      password [email or web address]  If 7PM-7AM, please contact night-coverage [email or web address] Password [email or web address] 01/12/2016, 2:21 PM   LOS: 18 days

## 2016-01-12 NOTE — Progress Notes (Signed)
  Hospital Problem List     Principal Problem:   Acute respiratory distress ([name] Active Problems:   HYPERCHOLESTEROLEMIA   Bronchitis   Diabetes mellitus with complication ([name]   S/P CABG (coronary artery bypass graft)   S/P AVR (aortic valve replacement)   Atrial fibrillation with RVR ([name]   Multifocal atrial tachycardia ([name]   Sepsis ([name]   COPD exacerbation ([name]   Metabolic acidosis   Dilated cardiomyopathy ([name]   Pulmonary hypertension ([name]   CAD in native artery   H/O aortic valve replacement   Uncontrolled type 2 diabetes mellitus with complication ([name]   Hyponatremia   Hypotension   Acute renal failure superimposed on stage 3 chronic kidney disease ([name]   Delirium   Acute renal failure ([name]   Cerebral thrombosis with cerebral infarction   Pressure ulcer   Slurred speech   Insulin dependent diabetes mellitus ([name]   Bacteremia    Patient Profile:   Primary Cardiologist: Dr. Crenshaw  82 y.o. male w/ PMH of CAD (s/p CABG 2010), aortic stenosis (s/p AVR 2010), Type 2 DM, HTN, and HLD admitted on 12/25/2015 for worsening cough and wheezing. Met SIRS criteria, diagnosed with PNA, now positive for strep bacteremia. Cards consulted for new-onset atrial fibrillation w/ RVR.   Subjective   Feeling slightly better.  Continues to have SOB.  Has not ambulated.    Inpatient Medications    . ampicillin (OMNIPEN) IV  2 g Intravenous 4 times per day  . antiseptic oral rinse  7 mL Mouth Rinse BID  . aspirin  324 mg Oral Daily  . atorvastatin  20 mg Oral q1800  . bisoprolol  2.5 mg Oral Daily  . budesonide (PULMICORT) nebulizer solution  0.25 mg Nebulization BID  . diltiazem  120 mg Oral 3 times per day  . feeding supplement (GLUCERNA SHAKE)  237 mL Oral TID BM  . furosemide  40 mg Intravenous Daily  . guaiFENesin  600 mg Oral BID  . heparin subcutaneous  5,000 Units Subcutaneous 3 times per day  . insulin aspart  0-20 Units Subcutaneous TID WC  . insulin  aspart  0-5 Units Subcutaneous QHS  . insulin aspart  4 Units Subcutaneous TID WC  . insulin glargine  30 Units Subcutaneous Daily  . levalbuterol  0.63 mg Nebulization TID  . multivitamin with minerals  1 tablet Oral Daily  . pantoprazole  40 mg Oral Daily  . polyethylene glycol  17 g Oral BID  . predniSONE  50 mg Oral Q breakfast  . sodium chloride flush  3 mL Intravenous Q12H    Vital Signs    Filed Vitals:   01/12/16 0722 01/12/16 0730 01/12/16 1046 01/12/16 1204  BP:  111/58 125/80 106/54  Pulse:  89 88 77  Temp:  98.5 F (36.9 C) 98 F (36.7 C) 98 F (36.7 C)  TempSrc:  Oral Oral Oral  Resp:  18 18 18  Height:      Weight:  92.897 kg (204 lb 12.8 oz)    SpO2: 90% 98% 98% 94%    Intake/Output Summary (Last 24 hours) at 01/12/16 1257 Last data filed at 01/12/16 1100  Gross per 24 hour  Intake   1140 ml  Output   1835 ml  Net   -695 ml   Filed Weights   01/10/16 0504 01/11/16 0634 01/12/16 0730  Weight: 91.989 kg (202 lb 12.8 oz) 92.761 kg (204 lb 8 oz) 92.897 kg (204 lb 12.8 oz)      Physical Exam    General: Elderly Caucasian  male appearing in no acute distress. Head: Normocephalic, atraumatic.  Neck: Supple without bruits, JVD not elevated. Lungs:  Resp regular and unlabored, expiratory wheezing noted throughout upper lung fields. No rales appreciated. Heart: Irregularly irregular, S1, S2, no S3, S4, or murmur; no rub. Abdomen: Soft, non-tender, non-distended with normoactive bowel sounds. No hepatomegaly. No rebound/guarding. No obvious abdominal masses. Extremities: No clubbing, cyanosis.  1+ bilateral pitting edema to bilateral LEs.  Distal pedal pulses are 2+ bilaterally. Neuro: Alert and oriented X 3. Moves all extremities spontaneously. Psych: Normal affect.  Labs    CBC  Recent Labs  01/10/16 0300  WBC 3.0*  HGB 9.4*  HCT 29.3*  MCV 94.2  PLT 135*   Basic Metabolic Panel  Recent Labs  01/10/16 0300 01/11/16 0440 01/12/16 0302  NA 139  141 135  K 4.2 4.1 3.9  CL 100* 102 98*  CO2 29 29 27  GLUCOSE 381* 268* 242*  BUN 36* 37* 40*  CREATININE 1.98* 2.21* 2.19*  CALCIUM 8.0* 8.2* 7.7*  MG 1.7  --   --      Telemetry    Atrial fibrillation, rate-controlled in the 70's - 80's.   ECG    No new tracings.   Cardiac Studies and Radiology    Dg Chest 2 View: 01/06/2016  CLINICAL DATA:  Fevers EXAM: CHEST  2 VIEW COMPARISON:  12/25/2015 FINDINGS: The cardiac shadow is within normal limits. Postsurgical changes are again noted. The lungs are well aerated bilaterally . Minimal increased density is noted in the posterior costophrenic angle on the lateral projection. No acute bony abnormality is seen. IMPRESSION: Mild posterior left lower lobe infiltrate. Electronically Signed   By: Mark  Lukens M.D.   On: 01/06/2016 17:43    Mr Brain Wo Contrast: 01/06/2016  CLINICAL DATA:  Slurred speech.  Weakness and fall. EXAM: MRI HEAD WITHOUT CONTRAST TECHNIQUE: Multiplanar, multiecho pulse sequences of the brain and surrounding structures were obtained without intravenous contrast. COMPARISON:  CT head 12/28/2015 FINDINGS: Multiple areas of restricted diffusion compatible with acute infarct. 5 x 10 mm acute infarct right lateral cerebellum. Subcentimeter acute infarct in the right caudate. Small areas of acute infarct in the right frontal parietal white matter and right parietal cortex. Probable emboli. Moderate to advanced atrophy. Mild chronic microvascular ischemic changes in the white matter. Chronic infarcts in the cerebellum bilaterally. Negative for intracranial hemorrhage.  Negative for mass or edema. Image quality degraded by moderate motion. Pituitary not enlarged.  Paranasal sinuses clear. IMPRESSION: Multiple small areas of acute infarct including the right cerebellum, right basal ganglia, and right parietal lobe. Probable emboli. Atrophy and chronic microvascular ischemia Image quality degraded by motion Electronically Signed   By:  Charles  Clark M.D.   On: 01/06/2016 17:01    Echocardiogram: 12/27/2015 Study Conclusions  - Left ventricle: The cavity size was normal. Systolic function was  normal. The estimated ejection fraction was in the range of 60%  to 65%. Wall motion was normal; there were no regional wall  motion abnormalities. The study is not technically sufficient to  allow evaluation of LV diastolic function. - Aortic valve: A bioprosthesis was present and functioning  normally. - Mitral valve: Severely calcified annulus. The findings are  consistent with severe stenosis. Mean gradient (D): 11 mm Hg. - Right ventricle: The cavity size was normal. Wall thickness was  normal. Systolic function was normal. - Tricuspid valve: There was trivial regurgitation.  Impressions: -   Poor sound transmission prohibits full assessment of the aortic  valve. Bioprosthetic valve appears to be functioning properly  without significant stenosis or regurgitation.   Echocardiogram: 01/03/2016 Study Conclusions - HPI and indications: Limited study to re-assess mitral valve  gradients. - Procedure narrative: Transthoracic echocardiography. Image  quality was adequate. The study was technically difficult. - Left ventricle: The cavity size was normal. Wall thickness was  increased in a pattern of mild LVH. Systolic function was normal.  The estimated ejection fraction was in the range of 60% to 65%.  Wall motion was normal; there were no regional wall motion  abnormalities. - Mitral valve: Heavy posterior MAC. Mild mitral stenosis. Trivial  regurgitation. Pressure half-time: 89 ms. Mean gradient (D): 5 mm  Hg. Valve area by pressure half-time: 2.34 cm^2.  Impressions: - Compared to a prior study on 12/27/15, there is only mild mitral  stenosis. This was reported as severe, however, I suspect a jet  sampling error. There is heavy posterior MAC with decreased  posterior leaflet excursion, however,  the anterior leaflet opens  well.  Assessment & Plan    1. New onset Atrial Fibrillation w/ RVR - diagnosed this admission. - This patients CHA2DS2-VASc Score and unadjusted Ischemic Stroke Rate (% per year) is equal to 11.2 % stroke rate/year from a score of 7 (HTN, DM, Vascular, Age (2), Stroke (2)).  A decision was made to use coumadin given his embolic strokes this admission. - Rate well-controlled on Cardizem 120mg Q8H and Bisoprolol 2.5mg daily with adequate rate-control at this time.  2. CAD - history of CABG in 2010 - continue ASA, Statin, and BB. Would continue with Atorvastatin at time of discharge due to interaction of Cardizem and PTA Simvastatin.  3. Aortic Stenosis - s/p AVR in 2010. - functioning normally on recent echocardiogram  4. Acute CVA - MRI on 01/06/2016 noted multiple small areas of acute infarct including the right cerebellum, right basal ganglia, and right parietal lobe. Probable emboli. - would start Coumadin - Neurology following.  5. Strep Bacteremia: Mr. Vandeventer would not be an operative candidate even if he were to have endocarditis.  Therefore, it is unclear to me that proceeding with TEE is most prudent, especially given that he developed hypoxia with sedation last time.  If TEE is strongly encouraged by his primary team, we could probably do this on Monday.  However, I would favor 6 weeks of empiric antibiotics given that it is unlikely to change the clinical course.    6. AKI - Creatinine elevated but stable  7. Volume Overload: Continues to improve.  -300 mL yesterday.  This occurred in the setting of AKI and treatment for sepsis. - occurring in the setting of AKI and treatment for Sepsis.  - improved, and is now + 2.3L this admission. Does not appear overly volume overloaded on physical exam.     C. Barry, MD, FACC  01/12/2016 12:57 PM  

## 2016-01-13 LAB — CULTURE, BLOOD (ROUTINE X 2)
Culture: NO GROWTH
Culture: NO GROWTH

## 2016-01-13 LAB — PROTIME-INR
INR: 1.12 (ref 0.00–1.49)
Prothrombin Time: 14.6 seconds (ref 11.6–15.2)

## 2016-01-13 LAB — BASIC METABOLIC PANEL
ANION GAP: 8 (ref 5–15)
BUN: 38 mg/dL — ABNORMAL HIGH (ref 6–20)
CALCIUM: 8 mg/dL — AB (ref 8.9–10.3)
CO2: 29 mmol/L (ref 22–32)
Chloride: 100 mmol/L — ABNORMAL LOW (ref 101–111)
Creatinine, Ser: 2.2 mg/dL — ABNORMAL HIGH (ref 0.61–1.24)
GFR calc Af Amer: 29 mL/min — ABNORMAL LOW (ref >60)
GFR, EST NON AFRICAN AMERICAN: 25 mL/min — AB (ref >60)
GLUCOSE: 200 mg/dL — AB (ref 65–99)
Potassium: 3.5 mmol/L (ref 3.5–5.1)
SODIUM: 137 mmol/L (ref 135–145)

## 2016-01-13 LAB — GLUCOSE, CAPILLARY
GLUCOSE-CAPILLARY: 215 mg/dL — AB (ref 65–99)
GLUCOSE-CAPILLARY: 227 mg/dL — AB (ref 65–99)
Glucose-Capillary: 188 mg/dL — ABNORMAL HIGH (ref 65–99)
Glucose-Capillary: 166 mg/dL — ABNORMAL HIGH (ref 65–99)

## 2016-01-13 MED ORDER — PREDNISONE 20 MG PO TABS
40.0000 mg | ORAL_TABLET | Freq: Every day | ORAL | Status: DC
  Administered 2016-01-14: 40 mg via ORAL
  Filled 2016-01-13: qty 2

## 2016-01-13 MED ORDER — POTASSIUM CHLORIDE CRYS ER 20 MEQ PO TBCR
40.0000 meq | EXTENDED_RELEASE_TABLET | Freq: Once | ORAL | Status: AC
Start: 2016-01-13 — End: 2016-01-13
  Administered 2016-01-13: 40 meq via ORAL
  Filled 2016-01-13: qty 2

## 2016-01-13 MED ORDER — GLUCERNA SHAKE PO LIQD: 237.0000 mL | Freq: Three times a day (TID) | ORAL | Status: DC

## 2016-01-13 MED ORDER — ATORVASTATIN CALCIUM 20 MG PO TABS: 20.0000 mg | ORAL_TABLET | Freq: Every day | ORAL | Status: DC

## 2016-01-13 NOTE — Progress Notes (Signed)
Hospital Problem List     Principal Problem:   Acute respiratory distress ([name] Active Problems:   HYPERCHOLESTEROLEMIA   Bronchitis   Diabetes mellitus with complication ([name]   S/P CABG (coronary artery bypass graft)   S/P AVR (aortic valve replacement)   Atrial fibrillation with RVR ([name]   Multifocal atrial tachycardia ([name]   Sepsis ([name]   COPD exacerbation ([name]   Metabolic acidosis   Dilated cardiomyopathy ([name]   Pulmonary hypertension ([name]   CAD in native artery   H/O aortic valve replacement   Uncontrolled type 2 diabetes mellitus with complication ([name]   Hyponatremia   Hypotension   Acute renal failure superimposed on stage 3 chronic kidney disease ([name]   Delirium   Acute renal failure ([name]   Cerebral thrombosis with cerebral infarction   Pressure ulcer   Slurred speech   Insulin dependent diabetes mellitus ([place])   Bacteremia    Patient Profile:   Primary Cardiologist: Dr. Stanford Breed  82 y.o. male w/ PMH of CAD (s/p CABG 2010), aortic stenosis (s/p AVR 2010), Type 2 DM, HTN, and HLD admitted on 12/25/2015 for worsening cough and wheezing. Met SIRS criteria, diagnosed with PNA, now positive for strep bacteremia. Cards consulted for new-onset atrial fibrillation w/ RVR.   Subjective   Feeling better.  Sat in the chair for a prolonged period yesterday.  Has not ambulated.   Inpatient Medications    . ampicillin (OMNIPEN) IV  2 g Intravenous 4 times per day  . antiseptic oral rinse  7 mL Mouth Rinse BID  . aspirin  324 mg Oral Daily  . atorvastatin  20 mg Oral q1800  . bisoprolol  2.5 mg Oral Daily  . budesonide (PULMICORT) nebulizer solution  0.25 mg Nebulization BID  . diltiazem  120 mg Oral 3 times per day  . feeding supplement (GLUCERNA SHAKE)  237 mL Oral TID BM  . furosemide  40 mg Intravenous Daily  . guaiFENesin  600 mg Oral BID  . heparin subcutaneous  5,000 Units Subcutaneous 3 times per day  . insulin aspart  0-20 Units Subcutaneous TID  WC  . insulin aspart  0-5 Units Subcutaneous QHS  . insulin aspart  4 Units Subcutaneous TID WC  . insulin glargine  30 Units Subcutaneous Daily  . levalbuterol  0.63 mg Nebulization TID  . multivitamin with minerals  1 tablet Oral Daily  . pantoprazole  40 mg Oral Daily  . polyethylene glycol  17 g Oral BID  . potassium chloride  40 mEq Oral Once  . predniSONE  50 mg Oral Q breakfast  . sodium chloride flush  3 mL Intravenous Q12H    Vital Signs    Filed Vitals:   01/13/16 0437 01/13/16 0718 01/13/16 0919 01/13/16 1154  BP: 122/66  103/65 115/62  Pulse: 76  84 75  Temp: 97.5 F (36.4 C)  98 F (36.7 C) 97.5 F (36.4 C)  TempSrc: Oral  Oral Oral  Resp: _0 Height:      Weight: 92.851 kg (204 lb 11.2 oz)     SpO2: 92% 90% 93% 98%    Intake/Output Summary (Last 24 hours) at 01/13/16 1217 Last data filed at 01/13/16 0900  Gross per 24 hour  Intake   1437 ml  Output   2575 ml  Net  -1138 ml   Filed Weights   01/11/16 0634 01/12/16 0730 01/13/16 0437  Weight: 92.761 kg (204 lb 8 oz) 92.897 kg (  204 lb 12.8 oz) 92.851 kg (204 lb 11.2 oz)    Physical Exam    General: Elderly Caucasian  male appearing in no acute distress. Head: Normocephalic, atraumatic.  Neck: Supple without bruits, JVD not elevated. Lungs:  Resp regular and unlabored.  Expiratory wheezing throughout.  No rales appreciated. Heart: Irregularly irregular, S1, S2, no S3, S4, or murmur; no rub. Abdomen: Soft, non-tender, non-distended with normoactive bowel sounds. No hepatomegaly. No rebound/guarding. No obvious abdominal masses. Extremities: No clubbing, cyanosis.  1+ bilateral pitting edema to bilateral LEs.  Distal pedal pulses are 2+ bilaterally. Neuro: Alert and oriented X 3. Moves all extremities spontaneously. Psych: Normal affect.  Labs    CBC No results for input(s): WBC, NEUTROABS, HGB, HCT, MCV, PLT in the last 72 hours. Basic Metabolic Panel  Recent Labs  01/12/16 0302  01/13/16 0402  NA 135 137  K 3.9 3.5  CL 98* 100*  CO2 27 29  GLUCOSE 242* 200*  BUN 40* 38*  CREATININE 2.19* 2.20*  CALCIUM 7.7* 8.0*     Telemetry    Atrial fibrillation, rate-controlled in the 70's - 80's.   ECG    No new tracings.   Cardiac Studies and Radiology    Dg Chest 2 View: 01/06/2016  CLINICAL DATA:  Fevers EXAM: CHEST  2 VIEW COMPARISON:  12/25/2015 FINDINGS: The cardiac shadow is within normal limits. Postsurgical changes are again noted. The lungs are well aerated bilaterally . Minimal increased density is noted in the posterior costophrenic angle on the lateral projection. No acute bony abnormality is seen. IMPRESSION: Mild posterior left lower lobe infiltrate. Electronically Signed   By: Inez Catalina M.D.   On: 01/06/2016 17:43    Mr Brain Wo Contrast: 01/06/2016  CLINICAL DATA:  Slurred speech.  Weakness and fall. EXAM: MRI HEAD WITHOUT CONTRAST TECHNIQUE: Multiplanar, multiecho pulse sequences of the brain and surrounding structures were obtained without intravenous contrast. COMPARISON:  CT head 12/28/2015 FINDINGS: Multiple areas of restricted diffusion compatible with acute infarct. 5 x 10 mm acute infarct right lateral cerebellum. Subcentimeter acute infarct in the right caudate. Small areas of acute infarct in the right frontal parietal white matter and right parietal cortex. Probable emboli. Moderate to advanced atrophy. Mild chronic microvascular ischemic changes in the white matter. Chronic infarcts in the cerebellum bilaterally. Negative for intracranial hemorrhage.  Negative for mass or edema. Image quality degraded by moderate motion. Pituitary not enlarged.  Paranasal sinuses clear. IMPRESSION: Multiple small areas of acute infarct including the right cerebellum, right basal ganglia, and right parietal lobe. Probable emboli. Atrophy and chronic microvascular ischemia Image quality degraded by motion Electronically Signed   By: Franchot Gallo M.D.   On:  01/06/2016 17:01    Echocardiogram: 12/27/2015 Study Conclusions  - Left ventricle: The cavity size was normal. Systolic function was  normal. The estimated ejection fraction was in the range of 60%  to 65%. Wall motion was normal; there were no regional wall  motion abnormalities. The study is not technically sufficient to  allow evaluation of LV diastolic function. - Aortic valve: A bioprosthesis was present and functioning  normally. - Mitral valve: Severely calcified annulus. The findings are  consistent with severe stenosis. Mean gradient (D): 11 mm Hg. - Right ventricle: The cavity size was normal. Wall thickness was  normal. Systolic function was normal. - Tricuspid valve: There was trivial regurgitation.  Impressions: - Poor sound transmission prohibits full assessment of the aortic  valve. Bioprosthetic valve appears to be  functioning properly  without significant stenosis or regurgitation.   Echocardiogram: 01/03/2016 Study Conclusions - HPI and indications: Limited study to re-assess mitral valve  gradients. - Procedure narrative: Transthoracic echocardiography. Image  quality was adequate. The study was technically difficult. - Left ventricle: The cavity size was normal. Wall thickness was  increased in a pattern of mild LVH. Systolic function was normal.  The estimated ejection fraction was in the range of 60% to 65%.  Wall motion was normal; there were no regional wall motion  abnormalities. - Mitral valve: Heavy posterior MAC. Mild mitral stenosis. Trivial  regurgitation. Pressure half-time: 89 ms. Mean gradient (D): 5 mm  Hg. Valve area by pressure half-time: 2.34 cm^2.  Impressions: - Compared to a prior study on 12/27/15, there is only mild mitral  stenosis. This was reported as severe, however, I suspect a jet  sampling error. There is heavy posterior MAC with decreased  posterior leaflet excursion, however, the anterior leaflet  opens  well.  Assessment & Plan    1. New onset Atrial Fibrillation w/ RVR - diagnosed this admission. - This patients CHA2DS2-VASc Score and unadjusted Ischemic Stroke Rate (% per year) is equal to 11.2 % stroke rate/year from a score of 7 (HTN, DM, Vascular, Age (2), Stroke (2)).  A decision was made to use coumadin given his embolic strokes this admission. - Rate well-controlled on Cardizem 174m Q8H and Bisoprolol 2.567mdaily with adequate rate-control at this time.  2. CAD - history of CABG in 2010 - continue ASA, Statin, and BB. Would continue with Atorvastatin at time of discharge due to interaction of Cardizem and PTA Simvastatin.  3. Aortic Stenosis - s/p AVR in 2010. - functioning normally on recent echocardiogram  4. Acute CVA - MRI on 01/06/2016 noted multiple small areas of acute infarct including the right cerebellum, right basal ganglia, and right parietal lobe. Probable emboli. - would start Coumadin - Neurology following.  5. Strep Bacteremia: Mr. BrKlasenould not be an operative candidate even if he were to have endocarditis.  Therefore, it is unclear to me that proceeding with TEE is most prudent, especially given that he developed hypoxia with sedation last time. He continues to wheeze on exam today. Per Neurology, he can't start anticoagulation until endocarditis is ruled out by TEE.  Again, I'm not sure this changes management as he will require anticoagulation for AF and he is not operative candidate.  We will plan for repeat attempt at TEE tomorrow, mostly for prognostic purposes.  Respiratory status is improved but he cannot undergoing sedation if he is still wheezing tomorrow.  6. AKI - Creatinine elevated but stable  7. Volume Overload: Continues to improve.  -300 mL yesterday.  This occurred in the setting of AKI and treatment for sepsis. - occurring in the setting of AKI and treatment for Sepsis.  - improved, and is now + 2.3L this admission. Does not  appear overly volume overloaded on physical exam.    Kiano Terrien C. RaOval LinseyMD, FAHarrison Surgery Center LLC3/26/2017 12:17 PM

## 2016-01-13 NOTE — Progress Notes (Addendum)
[facility]  PROGRESS NOTE  JAHMAD PETRICH PHX:[id number] DOB: [birth year] DOA: 12/25/2015 PCP: [name], MD  Admit HPI / Brief Narrative: 82 y.o. male who c/o a 2 day history of generalized weakness which lead to him falling out of bed 2 days prior. He developed hip pain from this fall. He also noted the new onset wheezing and coughing with occasional phlegm production, and a runny nose w/ upper respiratory congestion. He also noted a very poor appetite with very little oral intake.   He came to the ER and was found to be tachycardic (had P waves - was irregular - possible MAT).  He was treated with nebs with improvement of resp failure and tightness. His HR continued to climb to 135. He was given Cardizem 20 mg with BP dropping after. He was given a fluid bolus, and a dilt drip at 5 mg was begun.   Patient admitted with encephalopathy, SIRS, respiratory failure thought to be secondary to Heart failure exacerbation and presume COPD exacerbation. He was found to be in A fib RVR. He received Treatment for HF, and COPD exacerbation. His Mental status improved until 3-19 when he spike fever, and was notice to be disarthric by family. He underwent MRI which was positive for multiple acute stroke. Also blood culture are positive for Strep B agalactiae. Chest x ray positive for PNA>   Plan is to proceed for TEE 3-22. ID will see patient in consultation. Anticoagulation will need to be revisited after TEE results. Follow neurology recommendation regarding anticoagulation.   HPI/Subjective: Feeling better, denies pain, aaox3, on 2liter oxygen  Assessment/Plan: SIRS, Bacteremia, PNA: Strep Bacteremia, Agalactiae.  On admission patient met guidelines for SIRS w/ HR> 90, RR> 20, WBC> 12 - no infiltrate on CXR - no + culture results - UA not c/w UTI . LFT normal  Spike fever 3-19, Chest X ray: mild posterior left lower infiltrates.  abx history since admission: levaquin 3/7 > 3/15 vanc on  3/19 and 20 Zosyn 3/19-3/21 ampicillin from 3/21 May need to place IJ for long term abx use  Planned for TEE 3-22 cancelled due to hypoxia with sedation, need to reschedule once respiratory status improves to rule out endocarditis. Patient with history of Aortic bioprosthetic valve replacement, Blood culture 3-19 growing: strep B agalactiae. Repeat blood culture from 3/21 pending, appreciate ID input Per neurology  Dr. Leonie Man recommendation on 3/23 "aspirin for now and anticoagulation only after endocarditis ruled out"  Acute stroke, multiple infarcts;  Likely embolic, in setting of A fib but also Bacteremia. Per nueorlogy anticoagulation only after endocarditis ruled out.  Currently on asa 37m qd, Appreciate neurology input, per neuro: no anticoagulation until rule out infective endocarditis   Acute COPD exac - acute hypoxic resp failure  Wheezing on 3/22, repeat cxr with ? pulmonary edema,   schedule nebulizer, add mucinex, trial of lasix,  3/23 less wheezing, taper steroids 3/24 wheezing almost resolved. 3/25 continue improving, no obvious wheezing on exam  A fib w/ RVR , CAHADSvasc score 7 ( age/htn/chf/dm2/cva) Cardiology following - IV cardizem now transitioned to oral - TSH normal - high risk for anticoag given recent falls - Cards suggests ASA 325 for now and reconsider anticaog in outpt f/u - no amio due to severe lung disease  Continue with Bisoprolol. cardizem , she was on dig briefly, this was stopped on 3/18 by cardiology. Discussed wi-th family 3-20, patient prior to admission was active. He fell prior to admission because he was sick.  Prior to admission , he used to go to grocery store, restaurants with assistance of son. They understand risk for bleeding and with risk of falling. Patient will be discharge to SNF. They agree with coumadin. This will need to be reevaluate after TEE results. Needs to follow up Neurology recommendations.    Acute mental status  change -Multifactorial to include SIRS, Ativan, steroids, acute on chronic renal failure, sundowning  -CT head unrevealing initially  -Worsening MS 3-19, MRI positive for stroke. Spike fever 100.5 on 3-19. (last fever) aaox3 on 3/24   Dysphagia Cleared for D1 diet w/ thin liquids per SLP initially , continue aspiration precaution. Patient want to eat, report tired of eating puree diet. Nutrition consulted, continue speech therapy, now on soft diet and thin liquid  Per speech eval on 01/10/2016: "Diet recommendations: Dysphagia 3 (mechanical soft);Thin liquid Liquids provided via: Cup;Straw Medication Administration: Whole meds with puree Supervision: Staff to assist with self feeding Compensations: Slow rate;Small sips/bites Postural Changes and/or Swallow Maneuvers: Seated upright 90 degrees"  Insulin dependent DM2 - acutely severely uncontrolled  A1c 7.1 - required insulin gtt earlier in stay - CBG reasonably controlled - follow w/o change today  Increase insulin in the setting of increase steroids  N/V - resolved  KUB noted gaseous distention of stomach on BIPAP - suspect bloating was mostly air blown in w/ BIPAP - simethicone - resolved   Constipation; better  Acute on CKD Stage 3 Baseline creatinine 1.6 - creatinine at presentation 2.58 w/ peak of 3.12 - avoid ACE/ARB - crt now at ~baseline  [employer]   01/11/16 0634 01/12/16 0730 01/13/16 0437  Weight: 92.761 kg (204 lb 8 oz) 92.897 kg (204 lb 12.8 oz) 92.851 kg (204 lb 11.2 oz)     Diuretics held for a few days, restarted on 3/23 due to sign of pulmonary edema on cxr , monitor cr  Acute Diastolic HF;  Good urine out put.  Cardiology managing  Holding torsemide due to increase cr. Diuretics restarted on 3/23  Hyponatremia Appears to have been due to hypovolemia and hyperglycemia - resolved   Coronary artery disease status post CABG x3 2010 Cont ASA + lipid med  Aortic stenosis status post valve replacement  2010 Pericardial tissue valve stable on TTE   Carotid artery stenosis  Dopplers May 2015 showed a 60-79% right and 1-39% left stenosis  Hyperlipidemia Cont med tx  GERD  HTN Stable on bisporolol, cardizem, lasix  Hypokalemia: replace k  Code Status: DO NOT RESUSCITATE Family Communication: patient Disposition Plan: TEE per neurology request, per cardiology, TEE likely early next week  Consultants: [facility] Cards  Neurology ID  Procedures: 3/9 TTE - EF 60-65% - Aortic valve: A bioprosthesis was present and functioning normally - Mitral valve: severe stenosis. Mean gradient (D): 11 mm Hg - Right ventricle: size was normal. Wall thickness normal. Systolic function was normal - Tricuspid valve: trivial regurgitation.  TEE pending  Antibiotics: levaquin 3/7 > 3/15 vanc on 3/19 and 20 Zosyn 3/19-3/21 ampicillin from 3/21  DVT prophylaxis: Subcutaneous heparin  Objective: Blood pressure 115/62, pulse 75, temperature 97.5 F (36.4 C), temperature source Oral, resp. rate 18, height 5' 9"  (1.753 m), weight 92.851 kg (204 lb 11.2 oz), SpO2 98 %.  Intake/Output Summary (Last 24 hours) at 01/13/16 1201 Last data filed at 01/13/16 0900  Gross per 24 hour  Intake   1437 ml  Output   2575 ml  Net  -1138 ml   [employer]  01/11/16 0634 01/12/16 0730 01/13/16 0437  Weight: 92.761 kg (204 lb 8 oz) 92.897 kg (204 lb 12.8 oz) 92.851 kg (204 lb 11.2 oz)   Exam: General: pleasant , aaox3.  Lungs: less bilateral wheezing  Cardiovascular: IRRR Abdomen: NT/ND, soft, bs+, no mass, no rebound  Extremities: no significant cyanosis, or clubbing - 2+ edema bilateral lower extremities documented before has much improved, only trace ankle edema on 3/26 exam Neuro: aaox3, pleasant, right lower extremity weaker than left, right lower extremity barely able to lift against resistance ? Slurred speech, baseline?  Data Reviewed:  Basic Metabolic Panel:  Recent Labs Lab 01/09/16 0608  01/10/16 0300 01/11/16 0440 01/12/16 0302 01/13/16 0402  NA 140 139 141 135 137  K 3.3* 4.2 4.1 3.9 3.5  CL 99* 100* 102 98* 100*  CO2 29 29 29 27 29   GLUCOSE 214* 381* 268* 242* 200*  BUN 39* 36* 37* 40* 38*  CREATININE 2.10* 1.98* 2.21* 2.19* 2.20*  CALCIUM 8.0* 8.0* 8.2* 7.7* 8.0*  MG  --  1.7  --   --   --     CBC:  Recent Labs Lab 01/07/16 0214 01/08/16 0252 01/10/16 0300  WBC 11.5* 6.7 3.0*  HGB 9.3* 8.5* 9.4*  HCT 29.5* 27.5* 29.3*  MCV 93.1 94.2 94.2  PLT 176 134* 135*   Liver Function Tests:  Recent Labs Lab 01/06/16 1736  AST 22  ALT 19  ALKPHOS 48  BILITOT 1.2  PROT 5.6*  ALBUMIN 2.4*   CBG:  Recent Labs Lab 01/12/16 1109 01/12/16 1551 01/12/16 2141 01/13/16 0743 01/13/16 1055  GLUCAP 257* 276* 273* 188* 166*    Recent Results (from the past 240 hour(s))  Culture, blood (routine x 2)     Status: None   Collection Time: 01/06/16  5:19 PM  Result Value Ref Range Status   Specimen Description BLOOD RIGHT ARM  Final   Special Requests   Final    BOTTLES DRAWN AEROBIC AND ANAEROBIC 6CC BLUE 5CC PURPLE   Culture  Setup Time   Final    IN BOTH AEROBIC AND ANAEROBIC BOTTLES GRAM POSITIVE COCCI CRITICAL RESULT CALLED TO, READ BACK BY AND VERIFIED WITH: N.WILEY,RN [id number] 01/07/16 M.CAMPBELL    Culture   Final    GROUP B STREP(S.AGALACTIAE)ISOLATED SUSCEPTIBILITIES PERFORMED ON PREVIOUS CULTURE WITHIN THE LAST 5 DAYS.    Report Status 01/09/2016 FINAL  Final  Culture, blood (routine x 2)     Status: None   Collection Time: 01/06/16  5:28 PM  Result Value Ref Range Status   Specimen Description BLOOD LEFT ARM  Final   Special Requests BOTTLES DRAWN AEROBIC AND ANAEROBIC 10CC  Final   Culture  Setup Time   Final    IN BOTH AEROBIC AND ANAEROBIC BOTTLES GRAM POSITIVE COCCI IN CHAINS IN CLUSTERS CRITICAL RESULT CALLED TO, READ BACK BY AND VERIFIED WITH: G.RASUL,RN [id number] 01/07/16 M.CAMPBELL    Culture GROUP B STREP(S.AGALACTIAE)ISOLATED  Final    Report Status 01/09/2016 FINAL  Final   Organism ID, Bacteria GROUP B STREP(S.AGALACTIAE)ISOLATED  Final      Susceptibility   Group b strep(s.agalactiae)isolated - MIC*    CLINDAMYCIN >=1 RESISTANT Resistant     AMPICILLIN <=0.25 SENSITIVE Sensitive     ERYTHROMYCIN >=8 RESISTANT Resistant     VANCOMYCIN 0.5 SENSITIVE Sensitive     CEFTRIAXONE <=0.12 SENSITIVE Sensitive     LEVOFLOXACIN 4 INTERMEDIATE Intermediate     * GROUP B STREP(S.AGALACTIAE)ISOLATED  Urine culture  Status: None   Collection Time: 01/07/16 12:50 AM  Result Value Ref Range Status   Specimen Description URINE, RANDOM  Final   Special Requests NONE  Final   Culture MULTIPLE SPECIES PRESENT, SUGGEST RECOLLECTION  Final   Report Status 01/08/2016 FINAL  Final  Culture, blood (routine x 2)     Status: None (Preliminary result)   Collection Time: 01/08/16  3:27 PM  Result Value Ref Range Status   Specimen Description BLOOD LEFT ANTECUBITAL  Final   Special Requests BOTTLES DRAWN AEROBIC AND ANAEROBIC 5CC EACH  Final   Culture NO GROWTH 4 DAYS  Final   Report Status PENDING  Incomplete  Culture, blood (routine x 2)     Status: None (Preliminary result)   Collection Time: 01/08/16  3:33 PM  Result Value Ref Range Status   Specimen Description BLOOD RIGHT HAND  Final   Special Requests BOTTLES DRAWN AEROBIC ONLY 5CC  Final   Culture NO GROWTH 4 DAYS  Final   Report Status PENDING  Incomplete    Studies:   Recent x-ray studies have been reviewed in detail by the Attending Physician  Scheduled Meds:  Scheduled Meds: . ampicillin (OMNIPEN) IV  2 g Intravenous 4 times per day  . antiseptic oral rinse  7 mL Mouth Rinse BID  . aspirin  324 mg Oral Daily  . atorvastatin  20 mg Oral q1800  . bisoprolol  2.5 mg Oral Daily  . budesonide (PULMICORT) nebulizer solution  0.25 mg Nebulization BID  . diltiazem  120 mg Oral 3 times per day  . feeding supplement (GLUCERNA SHAKE)  237 mL Oral TID BM  . furosemide  40  mg Intravenous Daily  . guaiFENesin  600 mg Oral BID  . heparin subcutaneous  5,000 Units Subcutaneous 3 times per day  . insulin aspart  0-20 Units Subcutaneous TID WC  . insulin aspart  0-5 Units Subcutaneous QHS  . insulin aspart  4 Units Subcutaneous TID WC  . insulin glargine  30 Units Subcutaneous Daily  . levalbuterol  0.63 mg Nebulization TID  . multivitamin with minerals  1 tablet Oral Daily  . pantoprazole  40 mg Oral Daily  . polyethylene glycol  17 g Oral BID  . potassium chloride  40 mEq Oral Once  . predniSONE  50 mg Oral Q breakfast  . sodium chloride flush  3 mL Intravenous Q12H    Time spent on care of this patient: 25 mins   Dawnell Bryant , MD PhD [phone]  [facility] Office  [phone] Pager - Text Page per Amion as per below:  On-Call/Text Page:      [email or web address]      password [email or web address]  If 7PM-7AM, please contact night-coverage [email or web address] Password [email or web address] 01/13/2016, 12:01 PM   LOS: 19 days

## 2016-01-14 DIAGNOSIS — I6330 Cerebral infarction due to thrombosis of unspecified cerebral artery: Secondary | ICD-10-CM

## 2016-01-14 LAB — GLUCOSE, CAPILLARY
GLUCOSE-CAPILLARY: 169 mg/dL — AB (ref 65–99)
Glucose-Capillary: 299 mg/dL — ABNORMAL HIGH (ref 65–99)
Glucose-Capillary: 329 mg/dL — ABNORMAL HIGH (ref 65–99)
Glucose-Capillary: 226 mg/dL — ABNORMAL HIGH (ref 65–99)

## 2016-01-14 LAB — BASIC METABOLIC PANEL
ANION GAP: 10 (ref 5–15)
BUN: 32 mg/dL — AB (ref 6–20)
CO2: 26 mmol/L (ref 22–32)
Calcium: 8 mg/dL — ABNORMAL LOW (ref 8.9–10.3)
Chloride: 101 mmol/L (ref 101–111)
Creatinine, Ser: 2.04 mg/dL — ABNORMAL HIGH (ref 0.61–1.24)
GFR calc Af Amer: 32 mL/min — ABNORMAL LOW (ref >60)
GFR, EST NON AFRICAN AMERICAN: 28 mL/min — AB (ref >60)
Glucose, Bld: 189 mg/dL — ABNORMAL HIGH (ref 65–99)
Potassium: 4 mmol/L (ref 3.5–5.1)
SODIUM: 137 mmol/L (ref 135–145)

## 2016-01-14 LAB — PROTIME-INR
INR: 1.03 (ref 0.00–1.49)
Prothrombin Time: 13.7 seconds (ref 11.6–15.2)

## 2016-01-14 MED ORDER — PREDNISONE 20 MG PO TABS
20.0000 mg | ORAL_TABLET | Freq: Every day | ORAL | Status: DC
  Administered 2016-01-15 – 2016-01-16 (×2): 20 mg via ORAL
  Filled 2016-01-14 (×2): qty 1

## 2016-01-14 NOTE — Care Management Important Message (Signed)
Important Message  Patient Details  Name: Eric [name] MRN: [id number] Date of Birth: [birth year]   Medicare Important Message Given:  Yes    Loann Quill 01/14/2016, 12:49 PM

## 2016-01-14 NOTE — Progress Notes (Signed)
Occupational Therapy Treatment Patient Details Name: Eric [name] MRN: [id number] DOB: 12/07/1926 Today's Date: 01/14/2016    History of present illness 82 yo presented with 2 day history of generalized weakness which resulted in patient falling out of bed 2 days ago. Developed hip pain from this fall which persisted. History of hip fracture and surgical repair. patient has had new onset wheezing and coughing with occasional phlegm production. pt presents with A-flutter, COPD Exacerbation, and N/V.  pt with hx of AVR, CABG, HTN, DM, Shingles, and L THR.  Pt with A-fib with RVR. COPD/Bronchitis.    OT comments  Pt continues to demonstrate cognitive deficits and some self limiting behavior at times. Pt reports "i can't" but with encouragement is able to demonstrate more initiation to task. Pt fixated on being "weak" and benefits from more direct commands during session. Pt will then engage in task and demonstrates some independence. Pt smiling during session and appreciated OOB. Recommend OOB for all meals with RN staff using stedy.   Follow Up Recommendations  SNF    Equipment Recommendations  Hospital bed    Recommendations for Other Services      Precautions / Restrictions Precautions Precautions: Fall Precaution Comments: watch O2 Sats`       Mobility Bed Mobility Overal bed mobility: Needs Assistance Bed Mobility: Supine to Sit;Rolling Rolling: Mod assist   Supine to sit: Max assist;HOB elevated     General bed mobility comments: cues to reach for rails and HOB elevated  Transfers Overall transfer level: Needs assistance     Sit to Stand: +2 physical assistance;Mod assist         General transfer comment: utilized chuck pad to assist with initiation of power up and to faciliatate trunk/hip extension. Performed several sit <> stands in stedy during functional task completion. Pt using Bil UE on bar and pulling into standing. Tactile cues for upright posture.      Balance Overall balance assessment: Needs assistance Sitting-balance support: Bilateral upper extremity supported;Feet supported Sitting balance-Leahy Scale: Poor Sitting balance - Comments: able to sit EOB unassisted for brief periods today   Standing balance support: During functional activity;Single extremity supported Standing balance-Leahy Scale: Poor                     ADL Overall ADL's : Needs assistance/impaired     Grooming: Wash/dry face;Minimal assistance;Sitting Grooming Details (indicate cue type and reason): in stedy at sink with incr time to initiate task. Pt states "I can't"  Ot encouraged patient to try and pt was able to wet wash cloth and turn water off. pt fixated on falling with max encouragment in stedy he is unable to fall     Lower Body Bathing: Total assistance Lower Body Bathing Details (indicate cue type and reason): sit<.stand from stedy                       General ADL Comments: pt initiated LB movement toward eob this session. pt needed (A) to come from supine to sit, pt automatic placement of hands on stedy as done in previous session but this time without cues. pt needed (A) to initiate sit<>Stand in stedy and able to stand in stedy total +2 min (A) with flaps placed behind patient.       Vision                     Perception     Praxis  Cognition   Behavior During Therapy: Flat affect Overall Cognitive Status: Impaired/Different from baseline Area of Impairment: Awareness;Memory;Following commands     Memory: Decreased recall of precautions;Decreased short-term memory  Following Commands: Follows one step commands with increased time   Awareness: Emergent   General Comments: pt no awareness to therapist from previous sessions. pt reports "i can't" prior to attempting any task. pt with incr time would initiate task.     Extremity/Trunk Assessment               Exercises     Shoulder Instructions        General Comments      Pertinent Vitals/ Pain          Home Living                                          Prior Functioning/Environment              Frequency Min 2X/week     Progress Toward Goals  OT Goals(current goals can now be found in the care plan section)  Progress towards OT goals: Progressing toward goals  Acute Rehab OT Goals Patient Stated Goal: Go home with my son OT Goal Formulation: With patient Time For Goal Achievement: 01/28/16 Potential to Achieve Goals: Good ADL Goals Pt Will Perform Eating: with set-up;sitting Pt Will Perform Grooming: with set-up;with supervision;sitting Pt Will Perform Upper Body Bathing: with set-up;with supervision;sitting Pt Will Transfer to Toilet: with min assist;with +2 assist;bedside commode Additional ADL Goal #1: bed mobility with min A +2 in preparation for ADL  Plan Discharge plan remains appropriate    Co-evaluation    PT/OT/SLP Co-Evaluation/Treatment: Yes Reason for Co-Treatment: Complexity of the patient's impairments (multi-system involvement);For patient/therapist safety   OT goals addressed during session: ADL's and self-care;Strengthening/ROM      End of Session Equipment Utilized During Treatment: Oxygen;Gait belt   Activity Tolerance Patient tolerated treatment well   Patient Left in chair;with call bell/phone within reach;with chair alarm set   Nurse Communication Mobility status;Precautions;Need for lift equipment        Time: [phone] OT Time Calculation (min): 31 min  Charges: OT General Charges $OT Visit: 1 Procedure OT Treatments $Self Care/Home Management : 8-22 mins  Peri Maris 01/14/2016, 1:21 PM    Jeri Modena   OTR/L Pager: [phone] Office: [phone] .

## 2016-01-14 NOTE — Progress Notes (Signed)
[facility]  PROGRESS NOTE  Eric [name] [id number] DOB: 15-Oct-1927 DOA: 12/25/2015 PCP: [name], MD  Admit HPI / Brief Narrative: 82 y.o. male who c/o a 2 day history of generalized weakness which lead to him falling out of bed 2 days prior. He developed hip pain from this fall. He also noted the new onset wheezing and coughing with occasional phlegm production, and a runny nose w/ upper respiratory congestion. He also noted a very poor appetite with very little oral intake.   He came to the ER and was found to be tachycardic (had P waves - was irregular - possible MAT).  He was treated with nebs with improvement of resp failure and tightness. His HR continued to climb to 135. He was given Cardizem 20 mg with BP dropping after. He was given a fluid bolus, and a dilt drip at 5 mg was begun.   Patient admitted with encephalopathy, SIRS, respiratory failure thought to be secondary to Heart failure exacerbation and presume COPD exacerbation. He was found to be in A fib RVR. He received Treatment for HF, and COPD exacerbation. His Mental status improved until 3-19 when he spike fever, and was notice to be disarthric by family. He underwent MRI which was positive for multiple acute stroke. Also blood culture are positive for Strep B agalactiae. Chest x ray positive for PNA>   Plan is to proceed for TEE 3-22. ID will see patient in consultation. Anticoagulation will need to be revisited after TEE results. Follow neurology recommendation regarding anticoagulation.   HPI/Subjective: denies pain, aaox3, on 2liter oxygen, sitting in chair  Assessment/Plan: SIRS, Bacteremia, PNA: Strep Bacteremia, Agalactiae.  On admission patient met guidelines for SIRS w/ HR> 90, RR> 20, WBC> 12 - no infiltrate on CXR - no + culture results - UA not c/w UTI . LFT normal  Spike fever 3-19, Chest X ray: mild posterior left lower infiltrates.  abx history since admission: levaquin 3/7 > 3/15 vanc  on 3/19 and 20 Zosyn 3/19-3/21 ampicillin from 3/21 need to place IJ for long term abx use  Planned for TEE 3-22 cancelled due to hypoxia with sedation, need to reschedule once respiratory status improves to rule out endocarditis. Patient with history of Aortic bioprosthetic valve replacement, Blood culture 3-19 growing: strep B agalactiae. Repeat blood culture from 3/21 no growth, appreciate ID input Per neurology  Dr. Leonie Man recommendation on 3/23 "aspirin for now and anticoagulation only after endocarditis ruled out"  Acute stroke, multiple infarcts;  Likely embolic, in setting of A fib but also Bacteremia. Per nueorlogy anticoagulation only after endocarditis ruled out.  Currently on asa 350m qd, Appreciate neurology input, per neuro: no anticoagulation until rule out infective endocarditis   Acute COPD exac - acute hypoxic resp failure  Wheezing on 3/22, repeat cxr with ? pulmonary edema,   schedule nebulizer, add mucinex, trial of lasix,  3/23 less wheezing, taper steroids 3/24 wheezing almost resolved. 3/25 continue improving, no obvious wheezing on exam 3/27 may be approaching to baseline, no wheezing, but + crackles, on lasix  A fib w/ RVR , CAHADSvasc score 7 ( age/htn/chf/dm2/cva) Cardiology following - IV cardizem now transitioned to oral - TSH normal - high risk for anticoag given recent falls - Cards suggests ASA 325 for now and reconsider anticaog in outpt f/u - no amio due to severe lung disease  Continue with Bisoprolol. cardizem , she was on dig briefly, this was stopped on 3/18 by cardiology. Dr. RTyrell AntonioDiscussed wi-th family  3-20, patient prior to admission was active. He fell prior to admission because he was sick. Prior to admission , he used to go to grocery store, restaurants with assistance of son. They understand risk for bleeding and with risk of falling. Patient will be discharge to SNF. They agree with coumadin.  This will need to be reevaluate after TEE  results. Needs to follow up Neurology recommendations.    Acute mental status change -Multifactorial to include SIRS, Ativan, steroids, acute on chronic renal failure, sundowning  -CT head unrevealing initially  -Worsening MS 3-19, MRI positive for stroke. Spike fever 100.5 on 3-19. (last fever) aaox3 from 3/24   Dysphagia Cleared for D1 diet w/ thin liquids per SLP initially , continue aspiration precaution. Patient want to eat, report tired of eating puree diet. Nutrition consulted, continue speech therapy, now on soft diet and thin liquid (poor dentition)  Per speech eval on 01/10/2016: "Diet recommendations: Dysphagia 3 (mechanical soft);Thin liquid Liquids provided via: Cup;Straw Medication Administration: Whole meds with puree Supervision: Staff to assist with self feeding Compensations: Slow rate;Small sips/bites Postural Changes and/or Swallow Maneuvers: Seated upright 90 degrees"  Insulin dependent DM2 - acutely severely uncontrolled  A1c 7.1 - required insulin gtt earlier in stay - CBG reasonably controlled -  Continue adjust insulin as taper off steroids  N/V - resolved  KUB noted gaseous distention of stomach on BIPAP - suspect bloating was mostly air blown in w/ BIPAP - simethicone - resolved   Constipation; better  Acute on CKD Stage 3 Baseline creatinine 1.6 - creatinine at presentation 2.58 w/ peak of 3.12 - avoid ACE/ARB - crt now at ~baseline  [employer]   01/12/16 0730 01/13/16 0437 01/14/16 1225  Weight: 92.897 kg (204 lb 12.8 oz) 92.851 kg (204 lb 11.2 oz) 92.035 kg (202 lb 14.4 oz)     Diuretics held for a few days, restarted on 3/23 due to sign of pulmonary edema on cxr , monitor cr  Acute Diastolic HF;  Good urine out put.  Cardiology managing  Holding torsemide due to increase cr. Diuretics restarted on 3/23  Hyponatremia Appears to have been due to hypovolemia and hyperglycemia - resolved   Coronary artery disease status post CABG x3  2010 Cont ASA + lipid med  Aortic stenosis status post valve replacement 2010 Pericardial tissue valve stable on TTE   Carotid artery stenosis  Dopplers May 2015 showed a 60-79% right and 1-39% left stenosis  Hyperlipidemia Cont med tx  GERD  HTN Stable on bisporolol, cardizem, lasix  Hypokalemia: replace k  Code Status: DO NOT RESUSCITATE Family Communication: patient Disposition Plan: TEE per neurology request, per cardiology, TEE 3/28,  Need to place IJ for long term abx use ( discussed with patient about picc and ckd, he elected to proceed with IJ)  Consultants: [facility] Cards  Neurology ID  Procedures: 3/9 TTE - EF 60-65% - Aortic valve: A bioprosthesis was present and functioning normally - Mitral valve: severe stenosis. Mean gradient (D): 11 mm Hg - Right ventricle: size was normal. Wall thickness normal. Systolic function was normal - Tricuspid valve: trivial regurgitation.  TEE 3/22 cancelled due to hypoxia, TEE 3/28  Antibiotics: levaquin 3/7 > 3/15 vanc on 3/19 and 20 Zosyn 3/19-3/21 ampicillin from 3/21, need total of 6 weeks abx per ID  DVT prophylaxis: Subcutaneous heparin  Objective: Blood pressure 100/54, pulse 96, temperature 97.7 F (36.5 C), temperature source Oral, resp. rate 18, height 5' 9" (1.753 m), weight 92.035 kg (202  lb 14.4 oz), SpO2 96 %.  Intake/Output Summary (Last 24 hours) at 01/14/16 1830 Last data filed at 01/14/16 1452  Gross per 24 hour  Intake    940 ml  Output   2025 ml  Net  -1085 ml   Filed Weights   01/12/16 0730 01/13/16 0437 01/14/16 1225  Weight: 92.897 kg (204 lb 12.8 oz) 92.851 kg (204 lb 11.2 oz) 92.035 kg (202 lb 14.4 oz)   Exam: General: pleasant , aaox3. Poor dentition Lungs: less bilateral wheezing , + bibasilar crackles Cardiovascular: IRRR Abdomen: NT/ND, soft, bs+, no mass, no rebound  Extremities: no significant cyanosis, or clubbing - 2+ edema bilateral lower extremities documented before has much  improved, only trace ankle edema on 3/26 exam Neuro: aaox3, pleasant, right lower extremity weaker than left, right lower extremity barely able to lift against resistance ? Slurred speech, baseline?  Data Reviewed:  Basic Metabolic Panel:  Recent Labs Lab 01/10/16 0300 01/11/16 0440 01/12/16 0302 01/13/16 0402 01/14/16 0559  NA 139 141 135 137 137  K 4.2 4.1 3.9 3.5 4.0  CL 100* 102 98* 100* 101  CO2 _0 GLUCOSE 381* 268* 242* 200* 189*  BUN 36* 37* 40* 38* 32*  CREATININE 1.98* 2.21* 2.19* 2.20* 2.04*  CALCIUM 8.0* 8.2* 7.7* 8.0* 8.0*  MG 1.7  --   --   --   --     CBC:  Recent Labs Lab 01/08/16 0252 01/10/16 0300  WBC 6.7 3.0*  HGB 8.5* 9.4*  HCT 27.5* 29.3*  MCV 94.2 94.2  PLT 134* 135*   Liver Function Tests: No results for input(s): AST, ALT, ALKPHOS, BILITOT, PROT, ALBUMIN in the last 168 hours. CBG:  Recent Labs Lab 01/13/16 1619 01/13/16 2211 01/14/16 0547 01/14/16 1214 01/14/16 1716  GLUCAP 215* 227* 169* 299* 329*    Recent Results (from the past 240 hour(s))  Culture, blood (routine x 2)     Status: None   Collection Time: 01/06/16  5:19 PM  Result Value Ref Range Status   Specimen Description BLOOD RIGHT ARM  Final   Special Requests   Final    BOTTLES DRAWN AEROBIC AND ANAEROBIC 6CC BLUE 5CC PURPLE   Culture  Setup Time   Final    IN BOTH AEROBIC AND ANAEROBIC BOTTLES GRAM POSITIVE COCCI CRITICAL RESULT CALLED TO, READ BACK BY AND VERIFIED WITH: N.WILEY,RN 0602 01/07/16 M.CAMPBELL    Culture   Final    GROUP B STREP(S.AGALACTIAE)ISOLATED SUSCEPTIBILITIES PERFORMED ON PREVIOUS CULTURE WITHIN THE LAST 5 DAYS.    Report Status 01/09/2016 FINAL  Final  Culture, blood (routine x 2)     Status: None   Collection Time: 01/06/16  5:28 PM  Result Value Ref Range Status   Specimen Description BLOOD LEFT ARM  Final   Special Requests BOTTLES DRAWN AEROBIC AND ANAEROBIC 10CC  Final   Culture  Setup Time   Final    IN BOTH AEROBIC  AND ANAEROBIC BOTTLES GRAM POSITIVE COCCI IN CHAINS IN CLUSTERS CRITICAL RESULT CALLED TO, READ BACK BY AND VERIFIED WITH: G.RASUL,RN 0272 01/07/16 M.CAMPBELL    Culture GROUP B STREP(S.AGALACTIAE)ISOLATED  Final   Report Status 01/09/2016 FINAL  Final   Organism ID, Bacteria GROUP B STREP(S.AGALACTIAE)ISOLATED  Final      Susceptibility   Group b strep(s.agalactiae)isolated - MIC*    CLINDAMYCIN >=1 RESISTANT Resistant     AMPICILLIN <=0.25 SENSITIVE Sensitive     ERYTHROMYCIN >=8 RESISTANT Resistant  VANCOMYCIN 0.5 SENSITIVE Sensitive     CEFTRIAXONE <=0.12 SENSITIVE Sensitive     LEVOFLOXACIN 4 INTERMEDIATE Intermediate     * GROUP B STREP(S.AGALACTIAE)ISOLATED  Urine culture     Status: None   Collection Time: 01/07/16 12:50 AM  Result Value Ref Range Status   Specimen Description URINE, RANDOM  Final   Special Requests NONE  Final   Culture MULTIPLE SPECIES PRESENT, SUGGEST RECOLLECTION  Final   Report Status 01/08/2016 FINAL  Final  Culture, blood (routine x 2)     Status: None   Collection Time: 01/08/16  3:27 PM  Result Value Ref Range Status   Specimen Description BLOOD LEFT ANTECUBITAL  Final   Special Requests BOTTLES DRAWN AEROBIC AND ANAEROBIC 5CC EACH  Final   Culture NO GROWTH 5 DAYS  Final   Report Status 01/13/2016 FINAL  Final  Culture, blood (routine x 2)     Status: None   Collection Time: 01/08/16  3:33 PM  Result Value Ref Range Status   Specimen Description BLOOD RIGHT HAND  Final   Special Requests BOTTLES DRAWN AEROBIC ONLY 5CC  Final   Culture NO GROWTH 5 DAYS  Final   Report Status 01/13/2016 FINAL  Final    Studies:   Recent x-ray studies have been reviewed in detail by the Attending Physician  Scheduled Meds:  Scheduled Meds: . ampicillin (OMNIPEN) IV  2 g Intravenous 4 times per day  . antiseptic oral rinse  7 mL Mouth Rinse BID  . aspirin  324 mg Oral Daily  . atorvastatin  20 mg Oral q1800  . bisoprolol  2.5 mg Oral Daily  .  budesonide (PULMICORT) nebulizer solution  0.25 mg Nebulization BID  . diltiazem  120 mg Oral 3 times per day  . feeding supplement (GLUCERNA SHAKE)  237 mL Oral TID BM  . furosemide  40 mg Intravenous Daily  . guaiFENesin  600 mg Oral BID  . heparin subcutaneous  5,000 Units Subcutaneous 3 times per day  . insulin aspart  0-20 Units Subcutaneous TID WC  . insulin aspart  0-5 Units Subcutaneous QHS  . insulin aspart  4 Units Subcutaneous TID WC  . insulin glargine  30 Units Subcutaneous Daily  . levalbuterol  0.63 mg Nebulization TID  . multivitamin with minerals  1 tablet Oral Daily  . pantoprazole  40 mg Oral Daily  . polyethylene glycol  17 g Oral BID  . [START ON 01/15/2016] predniSONE  20 mg Oral Q breakfast  . sodium chloride flush  3 mL Intravenous Q12H    Time spent on care of this patient: 25 mins   Allysha Tryon , MD PhD [phone]  [facility] Office  [phone] Pager - Text Page per Amion as per below:  On-Call/Text Page:      [email or web address]      password [email or web address]  If 7PM-7AM, please contact night-coverage [email or web address] Password [email or web address] 01/14/2016, 6:30 PM   LOS: 20 days

## 2016-01-14 NOTE — Progress Notes (Signed)
Physical Therapy Treatment Patient Details Name: Eric [name] MRN: [id number] DOB: 1926/11/25 Today's Date: 01/14/2016    History of Present Illness 82 yo presented with 2 day history of generalized weakness which resulted in patient falling out of bed 2 days ago. Developed hip pain from this fall which persisted. History of hip fracture and surgical repair. patient has had new onset wheezing and coughing with occasional phlegm production. pt presents with A-flutter, COPD Exacerbation, and N/V.  pt with hx of AVR, CABG, HTN, DM, Shingles, and L THR.  Pt with A-fib with RVR. COPD/Bronchitis.     PT Comments    Patient seen in conjunction with OT for therapy today. Patient able to perform various functional tasks while working on power ups, LEs strengthening, and sit<>stand part task training using stedy lift. Patient tolerated well but limited overall by right side fatigue. Patient did reports + dizziness with extended standing activities, BP assessed and stable. O2 saturations 89% on 2 liters, 94% on 3 liters with HR remaining 60s-70s.  Follow Up Recommendations  SNF ([place] )     Equipment Recommendations  None recommended by PT    Recommendations for Other Services       Precautions / Restrictions Precautions Precautions: Fall Precaution Comments: watch O2 Sats` Restrictions Weight Bearing Restrictions: No    Mobility  Bed Mobility Overal bed mobility: Needs Assistance Bed Mobility: Rolling;Supine to Sit Rolling: Mod assist   Supine to sit: Max assist     General bed mobility comments: Patient required assist to elevate trunk to upright and rotate hips to EOB. Increased time and effort to perform.  Transfers Overall transfer level: Needs assistance   Transfers: Sit to/from Stand Sit to Stand: +2 physical assistance;Mod assist         General transfer comment: utilized chuck pad to assist with initiation of power up and to faciliatate trunk/hip extension.  Performed zeveral sit <> stands in stedy during functional task completion. Pt using Bil UE on bar and pulling into standing. Tactile cues for upright posture.   Ambulation/Gait                 Stairs            Wheelchair Mobility    Modified Rankin (Stroke Patients Only)       Balance Overall balance assessment: Needs assistance Sitting-balance support: Bilateral upper extremity supported Sitting balance-Leahy Scale: Poor Sitting balance - Comments: able to sit EOB unassisted for breif periods today   Standing balance support: During functional activity Standing balance-Leahy Scale: Poor Standing balance comment: reliance on UE support, Right side giving way with fatigue                     Cognition Arousal/Alertness: Awake/alert Behavior During Therapy: Flat affect Overall Cognitive Status: Impaired/Different from baseline Area of Impairment: Awareness     Memory: Decreased short-term memory     Awareness: Emergent        Exercises General Exercises - Lower Extremity Ankle Circles/Pumps: AROM;Both Other Exercises Other Exercises: performed sit<>stand static standing activities for varied length of time x4 during session. limited by fatigue. Other Exercises: PROM hip/knee flexion in bed bilaterally x5    General Comments        Pertinent Vitals/Pain Pain Assessment: Faces Faces Pain Scale: Hurts little more Pain Location: everywhere Pain Descriptors / Indicators: Aching Pain Intervention(s): Monitored during session;Repositioned    Home Living  Prior Function            PT Goals (current goals can now be found in the care plan section) Acute Rehab PT Goals PT Goal Formulation: With patient Time For Goal Achievement: 01/23/16 Potential to Achieve Goals: Good Progress towards PT goals: Progressing toward goals    Frequency  Min 2X/week    PT Plan Current plan remains appropriate     Co-evaluation PT/OT/SLP Co-Evaluation/Treatment: Yes Reason for Co-Treatment: For patient/therapist safety PT goals addressed during session: Mobility/safety with mobility       End of Session Equipment Utilized During Treatment: Gait belt Activity Tolerance: Patient limited by fatigue Patient left: with chair alarm set;with call bell/phone within reach;in chair     Time: [id number] PT Time Calculation (min) (ACUTE ONLY): 31 min  Charges:  $Therapeutic Activity: 8-22 mins                    G CodesDuncan Dull 02/12/16, 9:08 AM Alben Deeds, PT DPT  [phone]

## 2016-01-14 NOTE — Progress Notes (Addendum)
Inpatient Diabetes Program Recommendations  AACE/ADA: New Consensus Statement on Inpatient Glycemic Control (2015)  Target Ranges:  Prepandial:   less than 140 mg/dL      Peak postprandial:   less than 180 mg/dL (1-2 hours)      Critically ill patients:  140 - 180 mg/dL  Results for Eric [name], Eric [name] (MRN [id number]) as of 01/14/2016 10:26  Ref. Range 01/13/2016 07:43 01/13/2016 10:55 01/13/2016 16:19 01/13/2016 22:11 01/14/2016 05:47  Glucose-Capillary Latest Ref Range: 65-99 mg/dL 188 (H) 166 (H) 215 (H) 227 (H) 169 (H)   Review of Glycemic Control  Current orders for Inpatient glycemic control: Lantus 30 units daily, Novolog 0-20 units TID with meals, Novolog 0-5 units QHS, Novolog 4 units TID with meals  Inpatient Diabetes Program Recommendations: Insulin - Meal Coverage: Note patient is NPO at this time for procedure today. Once diet is resumed, please consider increasing meal coverage to Novolog 7 units TID with meals.   Thanks, Barnie Alderman, RN, MSN, CDE Diabetes Coordinator Inpatient Diabetes Program [phone] (Team Pager from [place] to [place]) [phone] ([facility] office) [phone] [facility] office) [phone] [facility] office)'

## 2016-01-14 NOTE — Progress Notes (Signed)
SUBJECTIVE: No complaints.   Tele: atrial fib  BP 109/64 mmHg  Pulse 71  Temp(Src) 97.9 F (36.6 C) (Oral)  Resp 18  Ht 5' 9" (1.753 m)  Wt 204 lb 11.2 oz (92.851 kg)  BMI 30.22 kg/m2  SpO2 94%  Intake/Output Summary (Last 24 hours) at 01/14/16 1033 Last data filed at 01/14/16 1006  Gross per 24 hour  Intake    490 ml  Output   2650 ml  Net  -2160 ml    PHYSICAL EXAM General: Well developed, well nourished, in no acute distress. Alert and oriented x 3.  Psych:  Good affect, responds appropriately Neck: No JVD. No masses noted.  Lungs: Coarse BS bilaterally with occasional wheezes.   Heart: irreg irreg  Abdomen: Bowel sounds are present. Soft, non-tender.  Extremities: No lower extremity edema.   LABS: Basic Metabolic Panel:  Recent Labs  01/13/16 0402 01/14/16 0559  NA 137 137  K 3.5 4.0  CL 100* 101  CO2 29 26  GLUCOSE 200* 189*  BUN 38* 32*  CREATININE 2.20* 2.04*  CALCIUM 8.0* 8.0*   Current Meds: . ampicillin (OMNIPEN) IV  2 g Intravenous 4 times per day  . antiseptic oral rinse  7 mL Mouth Rinse BID  . aspirin  324 mg Oral Daily  . atorvastatin  20 mg Oral q1800  . bisoprolol  2.5 mg Oral Daily  . budesonide (PULMICORT) nebulizer solution  0.25 mg Nebulization BID  . diltiazem  120 mg Oral 3 times per day  . feeding supplement (GLUCERNA SHAKE)  237 mL Oral TID BM  . furosemide  40 mg Intravenous Daily  . guaiFENesin  600 mg Oral BID  . heparin subcutaneous  5,000 Units Subcutaneous 3 times per day  . insulin aspart  0-20 Units Subcutaneous TID WC  . insulin aspart  0-5 Units Subcutaneous QHS  . insulin aspart  4 Units Subcutaneous TID WC  . insulin glargine  30 Units Subcutaneous Daily  . levalbuterol  0.63 mg Nebulization TID  . multivitamin with minerals  1 tablet Oral Daily  . pantoprazole  40 mg Oral Daily  . polyethylene glycol  17 g Oral BID  . predniSONE  40 mg Oral Q breakfast  . sodium chloride flush  3 mL Intravenous Q12H      ASSESSMENT AND PLAN: 82 y.o. male w/ PMH of CAD (s/p CABG 2010), aortic stenosis (s/p AVR 2010), Type 2 DM, HTN, and HLD admitted on 12/25/2015 for worsening cough and wheezing. Met SIRS criteria, diagnosed with PNA, now positive for strep bacteremia. Cards consulted for new-onset atrial fibrillation w/ RVR. Pt had CVA 01/06/16.   1. New onset Atrial Fibrillation w/ RVR: Rate controlled on Cardizem CD 120 mg daily and Bisoprolol 2.5 mg daily. CHADSVASC score 7. He is on coumadin given embolic strokes this admission.   2. CAD s/p CABG without angina: Stable. Will continue ASA, Statin, and beta blocker. Would continue with Atorvastatin at time of discharge due to interaction of Cardizem and PTA Simvastatin.  3. Aortic Stenosis: s/p AVR in 2010. Functioning normally on recent echocardiogram  4. Acute CVA: MRI on 01/06/2016 noted multiple small areas of acute infarct including the right cerebellum, right basal ganglia, and right parietal lobe. Probable embolic. Would start Coumadin when ok with Neurology. Neurology has stated not to start coumadin until TEE completed. Would have Neurology revisit this plan. If the TEE has to be done, we can arrange for tomorrow but not  low risk in this pt. (see below).  5. Strep Bacteremia: Neurology team recommending TEE but he is not an operative candidate even if he were to have endocarditis. It is unclear that proceeding with a TEE is prudent, especially given that he developed hypoxia with sedation last time. Per Neurology, he can't start anticoagulation until endocarditis is ruled out by TEE.He will require anticoagulation for AF and he is not operative candidate. We will plan for repeat attempt at TEE tomorrow for prognostic purposes unless recommendations change by Neurology. Respiratory status is stable today but his baseline is not optimal.    ,  3/27/201710:33 AM

## 2016-01-15 ENCOUNTER — Inpatient Hospital Stay (HOSPITAL_COMMUNITY): Payer: Medicare Other

## 2016-01-15 ENCOUNTER — Encounter (HOSPITAL_COMMUNITY): Payer: Self-pay | Admitting: *Deleted

## 2016-01-15 ENCOUNTER — Encounter (HOSPITAL_COMMUNITY)
Admission: EM | Disposition: A | Discharge status: Skilled Nursing Facility | Payer: Self-pay | Source: Home / Self Care | Attending: Internal Medicine

## 2016-01-15 DIAGNOSIS — I340 Nonrheumatic mitral (valve) insufficiency: Secondary | ICD-10-CM

## 2016-01-15 DIAGNOSIS — I330 Acute and subacute infective endocarditis: Secondary | ICD-10-CM

## 2016-01-15 DIAGNOSIS — I76 Septic arterial embolism: Secondary | ICD-10-CM

## 2016-01-15 DIAGNOSIS — I749 Embolism and thrombosis of unspecified artery: Secondary | ICD-10-CM

## 2016-01-15 HISTORY — PX: TEE WITHOUT CARDIOVERSION: SHX5443

## 2016-01-15 LAB — CBC
HEMATOCRIT: 28 % — AB (ref 39.0–52.0)
Hemoglobin: 8.9 g/dL — ABNORMAL LOW (ref 13.0–17.0)
MCH: 29 pg (ref 26.0–34.0)
MCHC: 31.8 g/dL (ref 30.0–36.0)
MCV: 91.2 fL (ref 78.0–100.0)
PLATELETS: 146 10*3/uL — AB (ref 150–400)
RBC: 3.07 MIL/uL — AB (ref 4.22–5.81)
RDW: 14.5 % (ref 11.5–15.5)
WBC: 3.7 10*3/uL — ABNORMAL LOW (ref 4.0–10.5)

## 2016-01-15 LAB — BASIC METABOLIC PANEL
Anion gap: 8 (ref 5–15)
BUN: 35 mg/dL — AB (ref 6–20)
CHLORIDE: 102 mmol/L (ref 101–111)
CO2: 27 mmol/L (ref 22–32)
CREATININE: 2.08 mg/dL — AB (ref 0.61–1.24)
Calcium: 7.9 mg/dL — ABNORMAL LOW (ref 8.9–10.3)
GFR calc Af Amer: 31 mL/min — ABNORMAL LOW (ref >60)
GFR calc non Af Amer: 27 mL/min — ABNORMAL LOW (ref >60)
Glucose, Bld: 158 mg/dL — ABNORMAL HIGH (ref 65–99)
Potassium: 3.5 mmol/L (ref 3.5–5.1)
Sodium: 137 mmol/L (ref 135–145)

## 2016-01-15 LAB — GLUCOSE, CAPILLARY
GLUCOSE-CAPILLARY: 210 mg/dL — AB (ref 65–99)
GLUCOSE-CAPILLARY: 215 mg/dL — AB (ref 65–99)
GLUCOSE-CAPILLARY: 98 mg/dL (ref 65–99)
Glucose-Capillary: 130 mg/dL — ABNORMAL HIGH (ref 65–99)

## 2016-01-15 LAB — PROTIME-INR
INR: 1.11 (ref 0.00–1.49)
Prothrombin Time: 14.5 seconds (ref 11.6–15.2)

## 2016-01-15 SURGERY — ECHOCARDIOGRAM, TRANSESOPHAGEAL
Anesthesia: Moderate Sedation

## 2016-01-15 MED ORDER — MIDAZOLAM HCL 10 MG/2ML IJ SOLN
INTRAMUSCULAR | Status: DC | PRN
  Administered 2016-01-15: 1 mg via INTRAVENOUS

## 2016-01-15 MED ORDER — SODIUM CHLORIDE 0.9 % IV SOLN
INTRAVENOUS | Status: DC
  Administered 2016-01-15: 07:00:00 via INTRAVENOUS

## 2016-01-15 MED ORDER — MIDAZOLAM HCL 5 MG/ML IJ SOLN
INTRAMUSCULAR | Status: AC
  Filled 2016-01-15: qty 1

## 2016-01-15 MED ORDER — BUTAMBEN-TETRACAINE-BENZOCAINE 2-2-14 % EX AERO
INHALATION_SPRAY | CUTANEOUS | Status: DC | PRN
  Administered 2016-01-15: 2 via TOPICAL

## 2016-01-15 MED ORDER — FENTANYL CITRATE (PF) 100 MCG/2ML IJ SOLN
INTRAMUSCULAR | Status: DC | PRN
  Administered 2016-01-15: 25 ug via INTRAVENOUS

## 2016-01-15 MED ORDER — FENTANYL CITRATE (PF) 100 MCG/2ML IJ SOLN
INTRAMUSCULAR | Status: AC
  Filled 2016-01-15: qty 2

## 2016-01-15 NOTE — Progress Notes (Signed)
[facility]   Reason for visit: Follow up on GBS bacteremia with septic emboli  Interval History: had TEE today and his native MV has a vegetation, nothing on his PAV.  Afebrile. No chest pain, no chills.  Repeat blood cultures remain negative.   TEE report reviewed.    Physical Exam: Constitutional:  Filed Vitals:   01/15/16 1410 01/15/16 1420  BP: 98/54 106/66  Pulse: 88 86  Temp:    Resp: 26 23   patient appears in NAD Eyes: anicteric HENT: no thrush Respiratory: Normal respiratory effort; CTA B Cardiovascular: RRR GI: soft, nt, nd  Review of Systems: Constitutional: negative for fevers Respiratory: negative for cough or wheezing Gastrointestinal: negative for diarrhea  Lab Results  Component Value Date   WBC 3.7* 01/15/2016   HGB 8.9* 01/15/2016   HCT 28.0* 01/15/2016   MCV 91.2 01/15/2016   PLT 146* 01/15/2016    Lab Results  Component Value Date   CREATININE 2.08* 01/15/2016   BUN 35* 01/15/2016   NA 137 01/15/2016   K 3.5 01/15/2016   CL 102 01/15/2016   CO2 27 01/15/2016    Lab Results  Component Value Date   ALT 19 01/06/2016   AST 22 01/06/2016   ALKPHOS 48 01/06/2016     Microbiology: Recent Results (from the past 240 hour(s))  Culture, blood (routine x 2)     Status: None   Collection Time: 01/06/16  5:19 PM  Result Value Ref Range Status   Specimen Description BLOOD RIGHT ARM  Final   Special Requests   Final    BOTTLES DRAWN AEROBIC AND ANAEROBIC 6CC BLUE 5CC PURPLE   Culture  Setup Time   Final    IN BOTH AEROBIC AND ANAEROBIC BOTTLES GRAM POSITIVE COCCI CRITICAL RESULT CALLED TO, READ BACK BY AND VERIFIED WITH: N.WILEY,RN [id number] 01/07/16 M.CAMPBELL    Culture   Final    GROUP B STREP(S.AGALACTIAE)ISOLATED SUSCEPTIBILITIES PERFORMED ON PREVIOUS CULTURE WITHIN THE LAST 5 DAYS.    Report Status 01/09/2016 FINAL  Final  Culture, blood (routine x 2)     Status: None   Collection Time: 01/06/16  5:28 PM    Result Value Ref Range Status   Specimen Description BLOOD LEFT ARM  Final   Special Requests BOTTLES DRAWN AEROBIC AND ANAEROBIC 10CC  Final   Culture  Setup Time   Final    IN BOTH AEROBIC AND ANAEROBIC BOTTLES GRAM POSITIVE COCCI IN CHAINS IN CLUSTERS CRITICAL RESULT CALLED TO, READ BACK BY AND VERIFIED WITH: G.RASUL,RN [id number] 01/07/16 M.CAMPBELL    Culture GROUP B STREP(S.AGALACTIAE)ISOLATED  Final   Report Status 01/09/2016 FINAL  Final   Organism ID, Bacteria GROUP B STREP(S.AGALACTIAE)ISOLATED  Final      Susceptibility   Group b strep(s.agalactiae)isolated - MIC*    CLINDAMYCIN >=1 RESISTANT Resistant     AMPICILLIN <=0.25 SENSITIVE Sensitive     ERYTHROMYCIN >=8 RESISTANT Resistant     VANCOMYCIN 0.5 SENSITIVE Sensitive     CEFTRIAXONE <=0.12 SENSITIVE Sensitive     LEVOFLOXACIN 4 INTERMEDIATE Intermediate     * GROUP B STREP(S.AGALACTIAE)ISOLATED  Urine culture     Status: None   Collection Time: 01/07/16 12:50 AM  Result Value Ref Range Status   Specimen Description URINE, RANDOM  Final   Special Requests NONE  Final   Culture MULTIPLE SPECIES PRESENT, SUGGEST RECOLLECTION  Final   Report Status 01/08/2016 FINAL  Final  Culture, blood (routine x 2)  Status: None   Collection Time: 01/08/16  3:27 PM  Result Value Ref Range Status   Specimen Description BLOOD LEFT ANTECUBITAL  Final   Special Requests BOTTLES DRAWN AEROBIC AND ANAEROBIC 5CC EACH  Final   Culture NO GROWTH 5 DAYS  Final   Report Status 01/13/2016 FINAL  Final  Culture, blood (routine x 2)     Status: None   Collection Time: 01/08/16  3:33 PM  Result Value Ref Range Status   Specimen Description BLOOD RIGHT HAND  Final   Special Requests BOTTLES DRAWN AEROBIC ONLY 5CC  Final   Culture NO GROWTH 5 DAYS  Final   Report Status 01/13/2016 FINAL  Final    Impression/Plan:  1. Native mitral valve endocarditis - GBS.   Will need 6 weeks of IV ampicillin.  He states he is going to rehab.  I will defer  access to Dr. Erlinda Hong, what best to put in.  Ampicillin through May 1st.   I will engage home health for post rehab antibiotics Ampicillin per home health protocol We will arrange follow up in our office Should also see cardiology and consider repeat echo (TTE) at the end of treatment  2.  Moderate MV regurgitation - per cardiology.  I will defer need for CVTS involvement to cardiology but do not think he would be a candidate for any surgery or that it is severe enough.    3. Renal insufficiency - creat stable around 2.  Avoiding picc line.    4. Multiple infarcts - I suspect infectious emboli with left sided endocarditis now noted.    I will sign off, please call with questions. thanks

## 2016-01-15 NOTE — Progress Notes (Addendum)
[facility]  PROGRESS NOTE  Eric [name] LGX:[id number] DOB: [birth year] DOA: 12/25/2015 PCP: [name], MD  Admit HPI / Brief Narrative: 82 y.o. male who c/o a 2 day history of generalized weakness which lead to him falling out of bed 2 days prior. He developed hip pain from this fall. He also noted the new onset wheezing and coughing with occasional phlegm production, and a runny nose w/ upper respiratory congestion. He also noted a very poor appetite with very little oral intake.   He came to the ER and was found to be tachycardic (had P waves - was irregular - possible MAT).  He was treated with nebs with improvement of resp failure and tightness. His HR continued to climb to 135. He was given Cardizem 20 mg with BP dropping after. He was given a fluid bolus, and a dilt drip at 5 mg was begun.   Patient admitted with encephalopathy, SIRS, respiratory failure thought to be secondary to Heart failure exacerbation and presume COPD exacerbation. He was found to be in A fib RVR. He received Treatment for HF, and COPD exacerbation. His Mental status improved until 3-19 when he spike fever, and was notice to be disarthric by family. He underwent MRI which was positive for multiple acute stroke. Also blood culture are positive for Strep B agalactiae. Chest x ray positive for PNA>    Anticoagulation will need to be revisited after TEE results. Follow neurology/cardiology recommendation regarding anticoagulation.  Need IJ placement for total of 6 weeks of abx per ID.  HPI/Subjective:  on 3liter oxygen, states breathing is ok, no cough noticed during encounter  Assessment/Plan: SIRS, Bacteremia, PNA: Strep Bacteremia, Agalactiae.  On admission patient met guidelines for SIRS w/ HR> 90, RR> 20, WBC> 12 - no infiltrate on CXR - no + culture results - UA not c/w UTI . LFT normal  Spike fever 3-19, Chest X ray: mild posterior left lower infiltrates.  abx history since  admission: levaquin 3/7 > 3/15 vanc on 3/19 and 20 Zosyn 3/19-3/21 ampicillin from 3/21 need to place IJ for long term abx use  Planned for TEE 3-22 cancelled due to hypoxia with sedation, repeat TEE on 3/28 pending. Patient with history of Aortic bioprosthetic valve replacement, Blood culture 3-19 growing: strep B agalactiae. Repeat blood culture from 3/21 no growth, appreciate ID input Per neurology  Dr. Leonie Man recommendation on 3/23 "aspirin for now and anticoagulation only after endocarditis ruled out"  Acute stroke, multiple infarcts;  Likely embolic, in setting of A fib but also Bacteremia. Per nueorlogy anticoagulation only after endocarditis ruled out.  Currently on asa 337m qd, Appreciate neurology input, per neuro: no anticoagulation until rule out infective endocarditis   Acute COPD exac - acute hypoxic resp failure  Wheezing on 3/22, repeat cxr with ? pulmonary edema,   schedule nebulizer, add mucinex, trial of lasix,  3/23 less wheezing, taper steroids 3/24 wheezing almost resolved. 3/25 continue improving, no obvious wheezing on exam 3/28 may be approaching to baseline, intermittent mild wheezing, but + crackles, on lasix  A fib w/ RVR , CAHADSvasc score 7 ( age/htn/chf/dm2/cva) Cardiology following - IV cardizem now transitioned to oral - TSH normal - high risk for anticoag given recent falls - Cards suggests ASA 325 for now and reconsider anticaog in outpt f/u - no amio due to severe lung disease  Continue with Bisoprolol. cardizem , she was on dig briefly, this was stopped on 3/18 by cardiology. Dr. RTyrell AntonioDiscussed wi-th family 3-20,  patient prior to admission was active. He fell prior to admission because he was sick. Prior to admission , he used to go to grocery store, restaurants with assistance of son. They understand risk for bleeding and with risk of falling. Patient will be discharge to SNF. They agree with coumadin.  Currently on asa 325, anticoagulation need to  be revisited after TEE results.    Acute mental status change -Multifactorial to include SIRS, Ativan, steroids, acute on chronic renal failure, sundowning  -CT head unrevealing initially  -Worsening MS 3-19, MRI positive for stroke. Spike fever 100.5 on 3-19. (last fever) aaox3 from 3/24   Dysphagia Cleared for D1 diet w/ thin liquids per SLP initially , continue aspiration precaution. Patient want to eat, report tired of eating puree diet. Nutrition consulted, continue speech therapy, now on soft diet and thin liquid (poor dentition)  Per speech eval on 01/10/2016: "Diet recommendations: Dysphagia 3 (mechanical soft);Thin liquid Liquids provided via: Cup;Straw Medication Administration: Whole meds with puree Supervision: Staff to assist with self feeding Compensations: Slow rate;Small sips/bites Postural Changes and/or Swallow Maneuvers: Seated upright 90 degrees"  Insulin dependent DM2 - acutely severely uncontrolled  A1c 7.1 - required insulin gtt earlier in stay - CBG reasonably controlled -  Continue adjust insulin as taper off steroids  N/V - resolved  KUB noted gaseous distention of stomach on BIPAP - suspect bloating was mostly air blown in w/ BIPAP - simethicone - resolved   Constipation; better  Acute on CKD Stage 3 Baseline creatinine 1.6 - creatinine at presentation 2.58 w/ peak of 3.12 - avoid ACE/ARB - crt now at ~baseline  [facility] Weights   01/13/16 0437 01/14/16 1225 01/15/16 0545  Weight: 92.851 kg (204 lb 11.2 oz) 92.035 kg (202 lb 14.4 oz) 89.132 kg (196 lb 8 oz)     Diuretics held for a few days, restarted on 3/23 due to sign of pulmonary edema on cxr , monitor cr  Acute Diastolic HF;  Good urine out put.  Cardiology managing  Holding torsemide due to increase cr. Diuretics restarted on 3/23  Hyponatremia Appears to have been due to hypovolemia and hyperglycemia - resolved   Coronary artery disease status post CABG x3 2010 Cont ASA + lipid  med  Aortic stenosis status post valve replacement 2010 Pericardial tissue valve stable on TTE   Carotid artery stenosis  Dopplers May 2015 showed a 60-79% right and 1-39% left stenosis  Hyperlipidemia Cont med tx  GERD  HTN Stable on bisporolol, cardizem, lasix  Hypokalemia: replace k  Code Status: DO NOT RESUSCITATE Family Communication: patient Disposition Plan: TEE per neurology request,  TEE 3/28,  Need to place IJ for long term abx use ( discussed with patient about picc and ckd, he elected to proceed with IJ)  Consultants: [facility] Cards  Neurology ID  Procedures: 3/9 TTE - EF 60-65% - Aortic valve: A bioprosthesis was present and functioning normally - Mitral valve: severe stenosis. Mean gradient (D): 11 mm Hg - Right ventricle: size was normal. Wall thickness normal. Systolic function was normal - Tricuspid valve: trivial regurgitation.  TEE 3/22 cancelled due to hypoxia, TEE 3/28  Antibiotics: levaquin 3/7 > 3/15 vanc on 3/19 and 20 Zosyn 3/19-3/21 ampicillin from 3/21, need total of 6 weeks abx per ID  DVT prophylaxis: Subcutaneous heparin  Objective: Blood pressure 106/66, pulse 86, temperature 98 F (36.7 C), temperature source Oral, resp. rate 23, height 5' 9"  (1.753 m), weight 89.132 kg (196 lb 8 oz), SpO2 94 %.  Intake/Output Summary (Last 24 hours) at 01/15/16 1430 Last data filed at 01/15/16 0804  Gross per 24 hour  Intake    627 ml  Output   1250 ml  Net   -623 ml   Filed Weights   01/13/16 0437 01/14/16 1225 01/15/16 0545  Weight: 92.851 kg (204 lb 11.2 oz) 92.035 kg (202 lb 14.4 oz) 89.132 kg (196 lb 8 oz)   Exam: General: pleasant , aaox3. Poor dentition Lungs: less bilateral wheezing , + bibasilar crackles Cardiovascular: IRRR Abdomen: NT/ND, soft, bs+, no mass, no rebound  Extremities: no significant cyanosis, or clubbing - 2+ edema bilateral lower extremities documented before has resolved Neuro: aaox3, pleasant, right lower  extremity weaker than left, right lower extremity barely able to lift against resistance ? Slurred speech, baseline?  Data Reviewed:  Basic Metabolic Panel:  Recent Labs Lab 01/10/16 0300 01/11/16 0440 01/12/16 0302 01/13/16 0402 01/14/16 0559 01/15/16 0222  NA 139 141 135 137 137 137  K 4.2 4.1 3.9 3.5 4.0 3.5  CL 100* 102 98* 100* 101 102  CO2 29 29 27 29 26 27   GLUCOSE 381* 268* 242* 200* 189* 158*  BUN 36* 37* 40* 38* 32* 35*  CREATININE 1.98* 2.21* 2.19* 2.20* 2.04* 2.08*  CALCIUM 8.0* 8.2* 7.7* 8.0* 8.0* 7.9*  MG 1.7  --   --   --   --   --     CBC:  Recent Labs Lab 01/10/16 0300 01/15/16 0222  WBC 3.0* 3.7*  HGB 9.4* 8.9*  HCT 29.3* 28.0*  MCV 94.2 91.2  PLT 135* 146*   Liver Function Tests: No results for input(s): AST, ALT, ALKPHOS, BILITOT, PROT, ALBUMIN in the last 168 hours. CBG:  Recent Labs Lab 01/14/16 0547 01/14/16 1214 01/14/16 1716 01/14/16 2040 01/15/16 0545  GLUCAP 169* 299* 329* 226* 130*    Recent Results (from the past 240 hour(s))  Culture, blood (routine x 2)     Status: None   Collection Time: 01/06/16  5:19 PM  Result Value Ref Range Status   Specimen Description BLOOD RIGHT ARM  Final   Special Requests   Final    BOTTLES DRAWN AEROBIC AND ANAEROBIC 6CC BLUE 5CC PURPLE   Culture  Setup Time   Final    IN BOTH AEROBIC AND ANAEROBIC BOTTLES GRAM POSITIVE COCCI CRITICAL RESULT CALLED TO, READ BACK BY AND VERIFIED WITH: N.WILEY,RN [id number] 01/07/16 M.CAMPBELL    Culture   Final    GROUP B STREP(S.AGALACTIAE)ISOLATED SUSCEPTIBILITIES PERFORMED ON PREVIOUS CULTURE WITHIN THE LAST 5 DAYS.    Report Status 01/09/2016 FINAL  Final  Culture, blood (routine x 2)     Status: None   Collection Time: 01/06/16  5:28 PM  Result Value Ref Range Status   Specimen Description BLOOD LEFT ARM  Final   Special Requests BOTTLES DRAWN AEROBIC AND ANAEROBIC 10CC  Final   Culture  Setup Time   Final    IN BOTH AEROBIC AND ANAEROBIC BOTTLES GRAM  POSITIVE COCCI IN CHAINS IN CLUSTERS CRITICAL RESULT CALLED TO, READ BACK BY AND VERIFIED WITH: G.RASUL,RN [id number] 01/07/16 M.CAMPBELL    Culture GROUP B STREP(S.AGALACTIAE)ISOLATED  Final   Report Status 01/09/2016 FINAL  Final   Organism ID, Bacteria GROUP B STREP(S.AGALACTIAE)ISOLATED  Final      Susceptibility   Group b strep(s.agalactiae)isolated - MIC*    CLINDAMYCIN >=1 RESISTANT Resistant     AMPICILLIN <=0.25 SENSITIVE Sensitive     ERYTHROMYCIN >=8 RESISTANT Resistant  VANCOMYCIN 0.5 SENSITIVE Sensitive     CEFTRIAXONE <=0.12 SENSITIVE Sensitive     LEVOFLOXACIN 4 INTERMEDIATE Intermediate     * GROUP B STREP(S.AGALACTIAE)ISOLATED  Urine culture     Status: None   Collection Time: 01/07/16 12:50 AM  Result Value Ref Range Status   Specimen Description URINE, RANDOM  Final   Special Requests NONE  Final   Culture MULTIPLE SPECIES PRESENT, SUGGEST RECOLLECTION  Final   Report Status 01/08/2016 FINAL  Final  Culture, blood (routine x 2)     Status: None   Collection Time: 01/08/16  3:27 PM  Result Value Ref Range Status   Specimen Description BLOOD LEFT ANTECUBITAL  Final   Special Requests BOTTLES DRAWN AEROBIC AND ANAEROBIC 5CC EACH  Final   Culture NO GROWTH 5 DAYS  Final   Report Status 01/13/2016 FINAL  Final  Culture, blood (routine x 2)     Status: None   Collection Time: 01/08/16  3:33 PM  Result Value Ref Range Status   Specimen Description BLOOD RIGHT HAND  Final   Special Requests BOTTLES DRAWN AEROBIC ONLY 5CC  Final   Culture NO GROWTH 5 DAYS  Final   Report Status 01/13/2016 FINAL  Final    Studies:   Recent x-ray studies have been reviewed in detail by the Attending Physician  Scheduled Meds:  Scheduled Meds: . [MAR Hold] ampicillin (OMNIPEN) IV  2 g Intravenous 4 times per day  . [MAR Hold] antiseptic oral rinse  7 mL Mouth Rinse BID  . [MAR Hold] aspirin  324 mg Oral Daily  . [MAR Hold] atorvastatin  20 mg Oral q1800  . [MAR Hold] bisoprolol   2.5 mg Oral Daily  . [MAR Hold] budesonide (PULMICORT) nebulizer solution  0.25 mg Nebulization BID  . [MAR Hold] diltiazem  120 mg Oral 3 times per day  . [MAR Hold] feeding supplement (GLUCERNA SHAKE)  237 mL Oral TID BM  . [MAR Hold] furosemide  40 mg Intravenous Daily  . [MAR Hold] guaiFENesin  600 mg Oral BID  . [MAR Hold] heparin subcutaneous  5,000 Units Subcutaneous 3 times per day  . [MAR Hold] insulin aspart  0-20 Units Subcutaneous TID WC  . [MAR Hold] insulin aspart  0-5 Units Subcutaneous QHS  . [MAR Hold] insulin aspart  4 Units Subcutaneous TID WC  . [MAR Hold] insulin glargine  30 Units Subcutaneous Daily  . [MAR Hold] levalbuterol  0.63 mg Nebulization TID  . [MAR Hold] multivitamin with minerals  1 tablet Oral Daily  . [MAR Hold] pantoprazole  40 mg Oral Daily  . [MAR Hold] polyethylene glycol  17 g Oral BID  . [MAR Hold] predniSONE  20 mg Oral Q breakfast  . [MAR Hold] sodium chloride flush  3 mL Intravenous Q12H    Time spent on care of this patient: 25 mins   Zeeshan Korte , MD PhD [phone]  [facility] Office  [phone] Pager - Text Page per Amion as per below:  On-Call/Text Page:      [email or web address]      password [email or web address]  If 7PM-7AM, please contact night-coverage [email or web address] Password [email or web address] 01/15/2016, 2:30 PM   LOS: 21 days

## 2016-01-15 NOTE — H&P (View-Only) (Signed)
SUBJECTIVE: no pain or dyspnea  Tele: atrial fib  BP 116/61 mmHg  Pulse 66  Temp(Src) 98.6 F (37 C) (Oral)  Resp 18  Ht 5' 9"  (1.753 m)  Wt 196 lb 8 oz (89.132 kg)  BMI 29.00 kg/m2  SpO2 94%  Intake/Output Summary (Last 24 hours) at 01/15/16 1052 Last data filed at 01/15/16 0804  Gross per 24 hour  Intake   1160 ml  Output   1250 ml  Net    -90 ml    PHYSICAL EXAM General: Well developed, well nourished, in no acute distress. Alert and oriented x 3.  Psych:  Good affect, responds appropriately Neck: No JVD. No masses noted.  Lungs: Coarse BS bilaterally with no wheezes  noted.  Heart: irreg irreg Abdomen: Bowel sounds are present. Soft, non-tender.  Extremities: No lower extremity edema.   LABS: Basic Metabolic Panel:  Recent Labs  01/14/16 0559 01/15/16 0222  NA 137 137  K 4.0 3.5  CL 101 102  CO2 26 27  GLUCOSE 189* 158*  BUN 32* 35*  CREATININE 2.04* 2.08*  CALCIUM 8.0* 7.9*   CBC:  Recent Labs  01/15/16 0222  WBC 3.7*  HGB 8.9*  HCT 28.0*  MCV 91.2  PLT 146*   Current Meds: . ampicillin (OMNIPEN) IV  2 g Intravenous 4 times per day  . antiseptic oral rinse  7 mL Mouth Rinse BID  . aspirin  324 mg Oral Daily  . atorvastatin  20 mg Oral q1800  . bisoprolol  2.5 mg Oral Daily  . budesonide (PULMICORT) nebulizer solution  0.25 mg Nebulization BID  . diltiazem  120 mg Oral 3 times per day  . feeding supplement (GLUCERNA SHAKE)  237 mL Oral TID BM  . furosemide  40 mg Intravenous Daily  . guaiFENesin  600 mg Oral BID  . heparin subcutaneous  5,000 Units Subcutaneous 3 times per day  . insulin aspart  0-20 Units Subcutaneous TID WC  . insulin aspart  0-5 Units Subcutaneous QHS  . insulin aspart  4 Units Subcutaneous TID WC  . insulin glargine  30 Units Subcutaneous Daily  . levalbuterol  0.63 mg Nebulization TID  . multivitamin with minerals  1 tablet Oral Daily  . pantoprazole  40 mg Oral Daily  . polyethylene glycol  17 g Oral BID    . predniSONE  20 mg Oral Q breakfast  . sodium chloride flush  3 mL Intravenous Q12H     ASSESSMENT AND PLAN: 82 y.o. male w/ PMH of CAD (s/p CABG 2010), aortic stenosis (s/p AVR 2010), Type 2 DM, HTN, and HLD admitted on 12/25/2015 for worsening cough and wheezing. Met SIRS criteria, diagnosed with PNA, now positive for strep bacteremia. Cards consulted for new-onset atrial fibrillation w/ RVR. Pt had CVA 01/06/16.   1. New onset Atrial Fibrillation w/ RVR: Rate controlled on Cardizem CD 120 mg daily and Bisoprolol 2.5 mg daily. CHADSVASC score 7. He is on coumadin given embolic strokes this admission.   2. CAD s/p CABG without angina: Stable. Will continue ASA, Statin, and beta blocker. Would continue with Atorvastatin at time of discharge due to interaction of Cardizem and PTA Simvastatin.  3. Aortic Stenosis: s/p AVR in 2010. Functioning normally on recent echocardiogram  4. Acute CVA: MRI on 01/06/2016 noted multiple small areas of acute infarct including the right cerebellum, right basal ganglia, and right parietal lobe. Probable embolic. Would start Coumadin when ok with Neurology. Neurology has stated  not to start coumadin until TEE completed. Will attempt TEE today if he can tolerate. If he cannot tolerate, would recommend starting coumadin.   5. Strep Bacteremia: Neurology team recommending TEE but he is not an operative candidate even if he were to have endocarditis. It is unclear that proceeding with a TEE is prudent, especially given that he developed hypoxia with sedation last time. Per Neurology, he can't start anticoagulation until endocarditis is ruled out by TEE.He will require anticoagulation for AF and he is not operative candidate. We will plan for repeat attempt at TEE today for prognostic purposes. Respiratory status is stable today but his baseline is not optimal.      Ikran Patman  3/28/201710:52 AM

## 2016-01-15 NOTE — Care Management Note (Signed)
Case Management Note  Patient Details  Name: Eric [name] MRN: [id number] Date of Birth: [birth year]  Subjective/Objective: SIRS, Bacteremia, PNA: Strep Bacteremia, Agalactiae, stroke                    Action/Plan: Discharge Planning: Chart reviewed. Scheduled dc to SNF- [facility] when medically stable. CSW following for SNF placement.   Expected Discharge Date:                Expected Discharge Plan:  Skilled Nursing Facility  In-House Referral:  Clinical Social Work  Discharge planning Services  CM Consult  Post Acute Care Choice:  NA Choice offered to:  NA  DME Arranged:  N/A DME Agency:  NA  HH Arranged:  NA HH Agency:  NA  Status of Service:  Completed, signed off  Medicare Important Message Given:  Yes Date Medicare IM Given:    Medicare IM give by:    Date Additional Medicare IM Given:    Additional Medicare Important Message give by:     If discussed at [place] of Stay Meetings, dates discussed:  01/15/2016  Additional Comments:  Erenest Rasher, RN 01/15/2016, 10:25 AM

## 2016-01-15 NOTE — Interval H&P Note (Signed)
History and Physical Interval Note:  01/15/2016 1:29 PM  Eric [name]  has presented today for surgery, with the diagnosis of BACTEREMIA  The various methods of treatment have been discussed with the patient and family. After consideration of risks, benefits and other options for treatment, the patient has consented to  Procedure(s): TRANSESOPHAGEAL ECHOCARDIOGRAM (TEE) (N/A) as a surgical intervention .  The patient's history has been reviewed, patient examined, no change in status, stable for surgery.  I have reviewed the patient's chart and labs.  Questions were answered to the patient's satisfaction.     Dal[place] [employer]

## 2016-01-15 NOTE — Progress Notes (Signed)
Pt HR down to 37 overnight non sustained, pt HR irregular, in Afib. HR currently 60-70s, pt asleep. K Schorr notified.

## 2016-01-15 NOTE — Progress Notes (Signed)
Inpatient Diabetes Program Recommendations  AACE/ADA: New Consensus Statement on Inpatient Glycemic Control (2015)  Target Ranges:  Prepandial:   less than 140 mg/dL      Peak postprandial:   less than 180 mg/dL (1-2 hours)      Critically ill patients:  140 - 180 mg/dL  Results for [name], Eric [name] (MRN [id number]) as of 01/15/2016 10:46  Ref. Range 01/14/2016 05:47 01/14/2016 12:14 01/14/2016 17:16 01/14/2016 20:40 01/15/2016 05:45  Glucose-Capillary Latest Ref Range: 65-99 mg/dL 169 (H) 299 (H) 329 (H) 226 (H) 130 (H)   Review of Glycemic Control   Current orders for Inpatient glycemic control: Lantus 30 units daily, Novolog 4 units TID with meals for meal coverage, Novolog 0-20 units TID with meals, Novolog 0-5 units QHS  Inpatient Diabetes Program Recommendations:  Insulin - Meal Coverage: Note patient is NPO at this time. Once diet is resumed, please consider increasing meal coverage to Novolog 7 units TID with meals.  Thanks, Barnie Alderman, RN, MSN, CDE Diabetes Coordinator Inpatient Diabetes Program [phone] (Team Pager from [place] to [place]) [phone] ([place] office) [phone] [facility] office) [phone] [facility] office)

## 2016-01-15 NOTE — Progress Notes (Signed)
Update regarding patient's status. Patient is not medically stable or ready for discharge. CSW will continue to follow and assist with SNF placement. Plan is for [facility] once medically cleared.   Lucius Conn, [place] Worker [facility] Ph: [phone]

## 2016-01-15 NOTE — Progress Notes (Signed)
  Echocardiogram Echocardiogram Transesophageal has been performed.  Eric [name] M 01/15/2016, 2:09 PM

## 2016-01-15 NOTE — CV Procedure (Signed)
Procedure: TEE  Indication: Bacteremia, assess for endocarditis.   Sedation: Versed 1 mg IV, Fentanyl 25 mcg IV  Findings: Please see echo section for full report.  Normal LV size with mild LV hypertrophy.  EF 55-60%.  Normal RV size and systolic function.  Trivial tricuspid regurgitation, no vegetation.  There was a bioprosthetic aortic valve with no vegetation.  The valve opened normally, no regurgitation.  Normal pulmonic valve.  There was moderate to severe mitral annular calcification.  Probably mild mitral stenosis, mean gradient 5 mmHg.  There was a 2 cm vegetation attached to the posterior mitral valve leaflet.  There was moderate mitral regurgitation.  Mild biatrial enlargement.  No LA appendage thrombus.  There was lipomatous atrial septal hypertrophy, no PFO or ASD with negative bubble study.  The aorta was normal in caliber with grade III plaque in the descending thoracic aorta.   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   Eric [name] 01/15/2016 1:54 PM

## 2016-01-15 NOTE — Progress Notes (Signed)
Pt with HR drop to 37/38 a couple times over night, pt did not sustain. Pt in Afib, HR now 60-70s. VSS otherwise. Cardizem due this AM, spoke with PA with cardiology, stated ok to give cardizem this AM. Day shift RN notified

## 2016-01-15 NOTE — Progress Notes (Signed)
SUBJECTIVE: no pain or dyspnea  Tele: atrial fib  BP 116/61 mmHg  Pulse 66  Temp(Src) 98.6 F (37 C) (Oral)  Resp 18  Ht 5' 9"  (1.753 m)  Wt 196 lb 8 oz (89.132 kg)  BMI 29.00 kg/m2  SpO2 94%  Intake/Output Summary (Last 24 hours) at 01/15/16 1052 Last data filed at 01/15/16 0804  Gross per 24 hour  Intake   1160 ml  Output   1250 ml  Net    -90 ml    PHYSICAL EXAM General: Well developed, well nourished, in no acute distress. Alert and oriented x 3.  Psych:  Good affect, responds appropriately Neck: No JVD. No masses noted.  Lungs: Coarse BS bilaterally with no wheezes  noted.  Heart: irreg irreg Abdomen: Bowel sounds are present. Soft, non-tender.  Extremities: No lower extremity edema.   LABS: Basic Metabolic Panel:  Recent Labs  01/14/16 0559 01/15/16 0222  NA 137 137  K 4.0 3.5  CL 101 102  CO2 26 27  GLUCOSE 189* 158*  BUN 32* 35*  CREATININE 2.04* 2.08*  CALCIUM 8.0* 7.9*   CBC:  Recent Labs  01/15/16 0222  WBC 3.7*  HGB 8.9*  HCT 28.0*  MCV 91.2  PLT 146*   Current Meds: . ampicillin (OMNIPEN) IV  2 g Intravenous 4 times per day  . antiseptic oral rinse  7 mL Mouth Rinse BID  . aspirin  324 mg Oral Daily  . atorvastatin  20 mg Oral q1800  . bisoprolol  2.5 mg Oral Daily  . budesonide (PULMICORT) nebulizer solution  0.25 mg Nebulization BID  . diltiazem  120 mg Oral 3 times per day  . feeding supplement (GLUCERNA SHAKE)  237 mL Oral TID BM  . furosemide  40 mg Intravenous Daily  . guaiFENesin  600 mg Oral BID  . heparin subcutaneous  5,000 Units Subcutaneous 3 times per day  . insulin aspart  0-20 Units Subcutaneous TID WC  . insulin aspart  0-5 Units Subcutaneous QHS  . insulin aspart  4 Units Subcutaneous TID WC  . insulin glargine  30 Units Subcutaneous Daily  . levalbuterol  0.63 mg Nebulization TID  . multivitamin with minerals  1 tablet Oral Daily  . pantoprazole  40 mg Oral Daily  . polyethylene glycol  17 g Oral BID    . predniSONE  20 mg Oral Q breakfast  . sodium chloride flush  3 mL Intravenous Q12H     ASSESSMENT AND PLAN: 82 y.o. male w/ PMH of CAD (s/p CABG 2010), aortic stenosis (s/p AVR 2010), Type 2 DM, HTN, and HLD admitted on 12/25/2015 for worsening cough and wheezing. Met SIRS criteria, diagnosed with PNA, now positive for strep bacteremia. Cards consulted for new-onset atrial fibrillation w/ RVR. Pt had CVA 01/06/16.   1. New onset Atrial Fibrillation w/ RVR: Rate controlled on Cardizem CD 120 mg daily and Bisoprolol 2.5 mg daily. CHADSVASC score 7. He is on coumadin given embolic strokes this admission.   2. CAD s/p CABG without angina: Stable. Will continue ASA, Statin, and beta blocker. Would continue with Atorvastatin at time of discharge due to interaction of Cardizem and PTA Simvastatin.  3. Aortic Stenosis: s/p AVR in 2010. Functioning normally on recent echocardiogram  4. Acute CVA: MRI on 01/06/2016 noted multiple small areas of acute infarct including the right cerebellum, right basal ganglia, and right parietal lobe. Probable embolic. Would start Coumadin when ok with Neurology. Neurology has stated  not to start coumadin until TEE completed. Will attempt TEE today if he can tolerate. If he cannot tolerate, would recommend starting coumadin.   5. Strep Bacteremia: Neurology team recommending TEE but he is not an operative candidate even if he were to have endocarditis. It is unclear that proceeding with a TEE is prudent, especially given that he developed hypoxia with sedation last time. Per Neurology, he can't start anticoagulation until endocarditis is ruled out by TEE.He will require anticoagulation for AF and he is not operative candidate. We will plan for repeat attempt at TEE today for prognostic purposes. Respiratory status is stable today but his baseline is not optimal.      Dieter Hane  3/28/201710:52 AM

## 2016-01-16 ENCOUNTER — Inpatient Hospital Stay (HOSPITAL_COMMUNITY): Payer: Medicare Other

## 2016-01-16 DIAGNOSIS — I6340 Cerebral infarction due to embolism of unspecified cerebral artery: Secondary | ICD-10-CM

## 2016-01-16 DIAGNOSIS — I339 Acute and subacute endocarditis, unspecified: Secondary | ICD-10-CM | POA: Insufficient documentation

## 2016-01-16 LAB — BASIC METABOLIC PANEL
ANION GAP: 8 (ref 5–15)
BUN: 31 mg/dL — ABNORMAL HIGH (ref 6–20)
CO2: 30 mmol/L (ref 22–32)
Calcium: 8.1 mg/dL — ABNORMAL LOW (ref 8.9–10.3)
Chloride: 100 mmol/L — ABNORMAL LOW (ref 101–111)
Creatinine, Ser: 2.07 mg/dL — ABNORMAL HIGH (ref 0.61–1.24)
GFR calc Af Amer: 31 mL/min — ABNORMAL LOW (ref >60)
GFR, EST NON AFRICAN AMERICAN: 27 mL/min — AB (ref >60)
GLUCOSE: 82 mg/dL (ref 65–99)
POTASSIUM: 3.3 mmol/L — AB (ref 3.5–5.1)
Sodium: 138 mmol/L (ref 135–145)

## 2016-01-16 LAB — CBC
HCT: 30.4 % — ABNORMAL LOW (ref 39.0–52.0)
Hemoglobin: 9.7 g/dL — ABNORMAL LOW (ref 13.0–17.0)
MCH: 29.3 pg (ref 26.0–34.0)
MCHC: 31.9 g/dL (ref 30.0–36.0)
MCV: 91.8 fL (ref 78.0–100.0)
PLATELETS: 170 10*3/uL (ref 150–400)
RBC: 3.31 MIL/uL — AB (ref 4.22–5.81)
RDW: 14.6 % (ref 11.5–15.5)
WBC: 4.2 10*3/uL (ref 4.0–10.5)

## 2016-01-16 LAB — GLUCOSE, CAPILLARY
GLUCOSE-CAPILLARY: 227 mg/dL — AB (ref 65–99)
GLUCOSE-CAPILLARY: 80 mg/dL (ref 65–99)
Glucose-Capillary: 217 mg/dL — ABNORMAL HIGH (ref 65–99)
Glucose-Capillary: 187 mg/dL — ABNORMAL HIGH (ref 65–99)
Glucose-Capillary: 210 mg/dL — ABNORMAL HIGH (ref 65–99)

## 2016-01-16 LAB — PROTIME-INR
INR: 1.11 (ref 0.00–1.49)
Prothrombin Time: 14.5 seconds (ref 11.6–15.2)

## 2016-01-16 MED ORDER — PREDNISONE 10 MG PO TABS
10.0000 mg | ORAL_TABLET | Freq: Every day | ORAL | Status: DC
  Administered 2016-01-17: 10 mg via ORAL
  Filled 2016-01-16 (×2): qty 1

## 2016-01-16 MED ORDER — PRO-STAT SUGAR FREE PO LIQD
30.0000 mL | Freq: Two times a day (BID) | ORAL | Status: DC
  Administered 2016-01-16 – 2016-01-17 (×2): 30 mL via ORAL
  Filled 2016-01-16 (×2): qty 30

## 2016-01-16 MED ORDER — FUROSEMIDE 40 MG PO TABS
60.0000 mg | ORAL_TABLET | Freq: Every day | ORAL | Status: DC
  Administered 2016-01-17: 60 mg via ORAL
  Filled 2016-01-16: qty 1

## 2016-01-16 NOTE — Progress Notes (Addendum)
STROKE TEAM PROGRESS NOTE   SUBJECTIVE (INTERVAL HISTORY) Stroke team was called back due to endocarditis found on TEE and management of anticoagulation for afib. ID involved and recommended antibiotics for 6 weeks. Will need MRA to rule out mycotic aneurysms.    OBJECTIVE Temp:  [97.6 F (36.4 C)-98.1 F (36.7 C)] 98.1 F (36.7 C) (03/29 1239) Pulse Rate:  [74-93] 74 (03/29 1239) Cardiac Rhythm:  [-] Atrial fibrillation (03/29 0830) Resp:  [20] 20 (03/29 1239) BP: (100-112)/(48-61) 107/59 mmHg (03/29 1239) SpO2:  [91 %-98 %] 94 % (03/29 1239) Weight:  [192 lb 9.6 oz (87.363 kg)] 192 lb 9.6 oz (87.363 kg) (03/29 0500)  CBC:   Recent Labs Lab 01/15/16 0222 01/16/16 0221  WBC 3.7* 4.2  HGB 8.9* 9.7*  HCT 28.0* 30.4*  MCV 91.2 91.8  PLT 146* [id number]    Basic Metabolic Panel:   Recent Labs Lab 01/10/16 0300  01/15/16 0222 01/16/16 0221  NA 139  < > 137 138  K 4.2  < > 3.5 3.3*  CL 100*  < > 102 100*  CO2 29  < > 27 30  GLUCOSE 381*  < > 158* 82  BUN 36*  < > 35* 31*  CREATININE 1.98*  < > 2.08* 2.07*  CALCIUM 8.0*  < > 7.9* 8.1*  MG 1.7  --   --   --   < > = values in this interval not displayed.  Lipid Panel:     Component Value Date/Time   CHOL 107 01/07/2016 0214   TRIG 112 01/07/2016 0214   HDL 33* 01/07/2016 0214   CHOLHDL 3.2 01/07/2016 0214   VLDL 22 01/07/2016 0214   LDLCALC 52 01/07/2016 0214   HgbA1c:  Lab Results  Component Value Date   HGBA1C 7.1* 12/25/2015   Urine Drug Screen: No results found for: LABOPIA, COCAINSCRNUR, LABBENZ, AMPHETMU, THCU, LABBARB    IMAGING  Dg Chest 2 View 01/06/2016   Mild posterior left lower lobe infiltrate.   Mr Brain Wo Contrast 01/06/2016   Multiple small areas of acute infarct including the right cerebellum, right basal ganglia, and right parietal lobe. Probable emboli. Atrophy and chronic microvascular ischemia Image quality degraded by motion.  CUS - Bilateral: 1-39% ICA stenosis. Vertebral artery flow  is antegrade.  TEE - Mitral valve endocarditis with moderate mitral regurgitation.   MRA - Mild distal small vessel disease without significant proximal stenosis, aneurysm, or branch vessel occlusion.   PHYSICAL EXAM Pleasant elderly male not in distress. . Afebrile. Head is nontraumatic. Neck is supple without bruit.    Cardiac exam NSR. Lungs are clear to auscultation. Distal pulses are well felt. Neurological Exam ;  Awake  Alert oriented x 3, mildly dysarthric speech but normal language. eye movements full without nystagmus.fundi were not visualized. Vision acuity and fields appear normal. Hearing is normal. Palatal movements are normal. Face symmetric. Tongue midline. Normal strength, tone, reflexes and coordination. Normal sensation. Gait deferred.   ASSESSMENT/PLAN [name] is a 82 y.o. male with history of cerebrovascular disease, coronary artery disease, hyperlipidemia, hypertension, diabetes mellitus, and aortic stenosis status post prosthetic valve replacement, presenting with slurred speech and confusion. He did not receive IV t-PA due to late presentation.  Stroke:  Multiple punctate right-sided infarcts likely embolic from MV endocarditis vs. afib not on AC. For endocarditis, no antithrombotics are indicated due to risk of mycotic aneurysm rupture. However, pt does have afib which requires anticoagulation for stroke prevention. Pt has been  on antibiotics for one week now, last BCx 01/08/16 negative, and current MRA negative for mycotic aneurysm. We would recommend to continue Abx and ASA for another one week, and repeat BCx. If negative, recommend to switch ASA to eliquis 2.5mg  bid as eliquis has the least bleeding risk among DOACs and coumadin according to current studies. This may have to be done in SNF and may need prior authorization, please arrange.   Resultant   Gait ataxia  MRI  Multiple small areas of acute infarct including the right cerebellum, right basal  ganglia, and right parietal lobe.  MRA unremarkable and no mycotic aneurysm seen  Carotid Doppler unremarkable  2D Echo - EF 60-65%  TEE - MV endocarditis  LDL 52  HgbA1c 7.1  VTE prophylaxis - subcutaneous heparin DIET DYS 3 Room service appropriate?: Yes; Fluid consistency:: Thin  aspirin 81 mg daily prior to admission, now on aspirin 325 mg daily. Recommend to continue Abx and ASA for another one week, and repeat BCx. If negative, recommend to switch ASA to eliquis 2.5mg  bid. This may have to be done in SNF and may need prior authorization, please arrange.  Patient counseled to be compliant with his antithrombotic medications  Ongoing aggressive stroke risk factor management  Therapy recommendations: Pending  Disposition: Pending  MV endocarditis  ID on board  Recommend IV antibiotics for 6 weeks  Already on ampicillin for one week now  Last BCx 01/08/16 negative  Paroxysmal afib   Currently NSR  Not candidate for [facility] now due to recent endocarditis and risk of mycotic aneurysm  Recommend to continue Abx and ASA for another one week, and repeat BCx. If negative, recommend to switch ASA to eliquis 2.5mg  bid  Hypertension Stable BP goal 120-140  Hyperlipidemia  Home meds: Zocor 40 mg daily  LDL 52, goal < 70  On lipitor 20  Continue statin at discharge  Diabetes  HgbA1c 7.1, goal < 7.0  Controlled  Other Stroke Risk Factors  Advanced age  Cigarette smoker, quit smoking 22 years ago.   ETOH use  Obesity, Body mass index is 28.43 kg/(m^2).   Coronary artery disease  Other Active Problems  Anemia  CKD - Cre 2.07  Hospital day # 22  Neurology will sign off. Please call with questions. He has been seen by Dr. Leonie Man several time during admission, he will follow up with Dr. Leonie Man at [facility] in about 2 months. Thanks for the consult.  Rosalin Hawking, MD PhD Stroke Neurology 01/16/2016 5:56 PM  To contact Stroke Continuity provider, please refer  to [email or web address] After hours, contact General Neurology

## 2016-01-16 NOTE — Progress Notes (Signed)
Occupational Therapy Treatment Patient Details Name: Eric [name] MRN: [id number] DOB: 09/02/1927 Today's Date: 01/16/2016    History of present illness 82 yo presented with 2 day history of generalized weakness which resulted in patient falling out of bed 2 days ago. Developed hip pain from this fall which persisted. History of hip fracture and surgical repair. patient has had new onset wheezing and coughing with occasional phlegm production. pt presents with A-flutter, COPD Exacerbation, and N/V.  pt with hx of AVR, CABG, HTN, DM, Shingles, and L THR.  Pt with A-fib with RVR. COPD/Bronchitis.    OT comments  Pt tolerated sink level activity this session with much encouragement and use of the stedy. Pt becomes fixated at times on "feeling weak". Pt very upset this session and refusing lunch because staff will not total (A) feed him. Pt does not require total (A) and should attempt these task with setup. Pt educated that refusal to eat is hurting his ability to heal and he reports "i don't give a damn. i am mad and I am not eating it."    Follow Up Recommendations  SNF    Equipment Recommendations  Hospital bed    Recommendations for Other Services      Precautions / Restrictions Precautions Precautions: Fall Precaution Comments: watch O2 Sats`       Mobility Bed Mobility Overal bed mobility: Needs Assistance Bed Mobility: Sit to Supine       Sit to supine: Mod assist   General bed mobility comments: Pt able to initiate and required (A) with BIL LE. pt able to realign UB in bed with tactile input  Transfers Overall transfer level: Needs assistance   Transfers: Sit to/from Stand Sit to Stand: Mod assist         General transfer comment: pt able to complete sit<>stand with stedy more automatically now after multiple uses and initiates without cues. pt even states "where is your help ?" this session recognizing that only one therapist is arriving this session     Balance Overall balance assessment: Needs assistance         Standing balance support: Bilateral upper extremity supported;During functional activity Standing balance-Leahy Scale: Poor                     ADL Overall ADL's : Needs assistance/impaired   Eating/Feeding Details (indicate cue type and reason): refusing to try to complete Grooming: Wash/dry hands;Wash/dry face;Applying deodorant;Brushing hair;Maximal assistance Grooming Details (indicate cue type and reason): pt sitting in stedy and tolerating (A) with task. pt refusing to attempt combing hair but positining self for Ot to complete Upper Body Bathing: Maximal assistance Upper Body Bathing Details (indicate cue type and reason): pt wiping once each arm and states "there you do it now" Lower Body Bathing: Total assistance Lower Body Bathing Details (indicate cue type and reason): pt tolerated static standign for 1 minute for peri care and application of lotion. pt with no c/o of fatigue or "i am weak" because pt requested the task be completed  Upper Body Dressing : Minimal assistance;Sitting                     General ADL Comments: pt sit<>stand one peron (A) into stedy. pt positioned at sink for full adl in half standing position for > 10 minutes. Pt static standind 1 minute. Pt demonstrates incr activity tolerance. Staff educated that pt can complete transfers and self feed. pt should be completing  task.       Vision                     Perception     Praxis      Cognition   Behavior During Therapy: [facility] for tasks assessed/performed Overall Cognitive Status: Impaired/Different from baseline Area of Impairment: Awareness            Awareness: Emergent   General Comments: pt reports "i can't " with decr initiation and needs max cueing at times to engage. Pt telling staff he was unable to self feed today per tech adn tech has been total (A) . pt is able to self feed and now refusing all  PO intake due to being mad at tech. Pt encouraged to eat but refusing at this time. Pt making the choice to decline food. Pt verbalized this to Dr Erlinda Hong and OT with tech present. "hell no I am mad at him. He just sat it down and wont help me"     Extremity/Trunk Assessment               Exercises     Shoulder Instructions       General Comments      Pertinent Vitals/ Pain       Pain Location: always states "not worth a damn"  "i am weak weak weak weak" but unable to verbalize any specific location  Home Living                                          Prior Functioning/Environment              Frequency Min 2X/week     Progress Toward Goals  OT Goals(current goals can now be found in the care plan section)  Progress towards OT goals: Progressing toward goals  Acute Rehab OT Goals Patient Stated Goal: Go home with my son OT Goal Formulation: With patient Time For Goal Achievement: 01/28/16 Potential to Achieve Goals: Good ADL Goals Pt Will Perform Eating: with set-up;sitting Pt Will Perform Grooming: with set-up;with supervision;sitting Pt Will Perform Upper Body Bathing: with set-up;with supervision;sitting Pt Will Transfer to Toilet: with min assist;with +2 assist;bedside commode Additional ADL Goal #1: bed mobility with min A +2 in preparation for ADL  Plan Discharge plan remains appropriate    Co-evaluation                 End of Session Equipment Utilized During Treatment: Gait belt;Oxygen   Activity Tolerance Patient tolerated treatment well   Patient Left in bed;with call bell/phone within reach;with bed alarm set;Other (comment) (Dr Erlinda Hong in room with patient )   Nurse Communication Mobility status;Precautions        Time: [phone] OT Time Calculation (min): 31 min  Charges: OT General Charges $OT Visit: 1 Procedure OT Treatments $Self Care/Home Management : 23-37 mins  Parke Poisson B 01/16/2016, 3:23 PM  Jeri Modena   OTR/L Pager: [phone] Office: [phone] .

## 2016-01-16 NOTE — Progress Notes (Signed)
[facility]  PROGRESS NOTE  Eric [name] TOI:[id number] DOB: 08-May-1927 DOA: 12/25/2015 PCP: [name], MD   Brief Narrative: 82 y.o. male who c/o a 2 day history of generalized weakness which lead to him falling out of bed 2 days prior. He developed hip pain from this fall. He also noted the new onset wheezing and coughing with occasional phlegm production, and a runny nose w/ upper respiratory congestion. He also noted a very poor appetite with very little oral intake.   He came to the ER and was found to be tachycardic (had P waves - was irregular - possible MAT).  He was treated with nebs with improvement of resp failure and tightness. His HR continued to climb to 135. He was given Cardizem 20 mg with BP dropping after. He was given a fluid bolus, and a dilt drip at 5 mg was begun.   Patient admitted with encephalopathy, SIRS, respiratory failure thought to be secondary to Heart failure exacerbation and presume COPD exacerbation. He was found to be in A fib RVR. He received Treatment for HF, and COPD exacerbation. His Mental status improved until 3-19 when he spike fever, and was notice to be disarthric by family. He underwent MRI which was positive for multiple acute stroke. Also blood culture are positive for Strep B agalactiae. Chest x ray positive for PNA>    Anticoagulation will need to be revisited after TEE results. Follow neurology/cardiology recommendation regarding anticoagulation.  Need IJ placement for total of 6 weeks of abx per ID.  HPI/Subjective:   No pain no fever Eating ok No cp No sob  No vomit slightly slurred speech  Assessment/Plan: SIRS, Bacteremia, PNA: Strep Bacteremia, Agalactiae.  On admission patient met guidelines for SIRS w/ HR> 90, RR> 20, WBC> 12 - no infiltrate on CXR - no + culture results - UA not c/w UTI . LFT normal  Spike fever 3-19, Chest X ray: mild posterior left lower infiltrates.  abx history since admission: levaquin 3/7  > 3/15 vanc on 3/19 and 20 Zosyn 3/19-3/21 ampicillin from 3/21 need to place IJ for long term abx use  Planned for TEE 3-22 cancelled due to hypoxia with sedation, repeat TEE on 3/28 pending. Patient with history of Aortic bioprosthetic valve replacement, Blood culture 3-19 growing: strep B agalactiae. Repeat blood culture from 3/21 no growth, appreciate ID input Per neurology  Dr. Leonie Man recommendation on 3/23 "aspirin for now and anticoagulation only after endocarditis ruled out"  Acute stroke, multiple infarcts;  Likely embolic, in setting of A fib but also Bacteremia. Per nueorlogy anticoagulation only after endocarditis ruled out.  Currently on asa '325mg'$  qd, Appreciate neurology input, per neuro: no anticoagulation until rule out infective endocarditis See Dr. Erlinda Hong notes 01/16/16-ASA + Abx for 1 more week then, as MRA ruled out mycotic aneurysm 3/29, start elliquis bid   Acute COPD exac - acute hypoxic resp failure  Wheezing on 3/22, repeat cxr with ? pulmonary edema,   schedule nebulizer, add mucinex, trial of lasix,  3/23 less wheezing, taper steroids 3/24 wheezing almost resolved. 3/25 continue improving, no obvious wheezing on exam 3/28 may be approaching to baseline, intermittent mild wheezing, but + crackles, on lasix  A fib w/ RVR , CAHADSvasc score 7 ( age/htn/chf/dm2/cva) Cardiology following - IV cardizem now transitioned to oral - TSH normal - high risk for anticoag given recent falls - Cards suggests ASA 325 for now and reconsider anticaog in outpt f/u - no amio due to severe lung  disease  Continue with Bisoprolol. cardizem , she was on dig briefly, this was stopped on 3/18 by cardiology. Dr. Tyrell Antonio Discussed wi-th family 3-20, patient prior to admission was active. He fell prior to admission because he was sick. Prior to admission , he used to go to grocery store, restaurants with assistance of son. They understand risk for bleeding and with risk of falling. Patient will be  discharge to SNF.  Please see above   Acute mental status change -Multifactorial to include SIRS, Ativan, steroids, acute on chronic renal failure, sundowning  -CT head unrevealing initially  -Worsening MS 3-19, MRI positive for stroke. Spike fever 100.5 on 3-19. (last fever) aaox3 from 3/24 -Resolved currently  Dysphagia Cleared for D1 diet w/ thin liquids per SLP initially , continue aspiration precaution. Patient want to eat, report tired of eating puree diet. Nutrition consulted, continue speech therapy, now on soft diet and thin liquid (poor dentition) Now on dys 3 diet on 01/10/16  Insulin dependent DM2 - acutely severely uncontrolled  A1c 7.1 - required insulin gtt earlier in stay - CBG reasonably controlled -  Continue adjust insulin as taper off steroids cbg 80-217  Continue Lantus 30, SSI   N/V - resolved  KUB noted gaseous distention of stomach on BIPAP - suspect bloating was mostly air blown in w/ BIPAP - simethicone - resolved   Constipation; better  Acute on CKD Stage 3 Baseline creatinine 1.6 - creatinine at presentation 2.58 w/ peak of 3.12 - avoid ACE/ARB - crt now at ~baseline  Filed Weights   01/14/16 1225 01/15/16 0545 01/16/16 0500  Weight: 92.035 kg (202 lb 14.4 oz) 89.132 kg (196 lb 8 oz) 87.363 kg (192 lb 9.6 oz)     Diuretics held for a few days, restarted on 3/23 due to sign of pulmonary edema on cxr , downward trending cr  Acute Diastolic HF;  Good urine out put.  Cardiology managing  Holding torsemide due to increase cr.  Diuretics IV lasix 40 restarted on 3/23-->lasix 60 po daily 3/29 Net -5.4 since hospital stay   Hyponatremia Appears to have been due to hypovolemia and hyperglycemia - resolved   Coronary artery disease status post CABG x3 2010 Cont ASA + lipid med  Aortic stenosis status post valve replacement 2010 Pericardial tissue valve stable on TTE   Carotid artery stenosis  Dopplers May 2015 showed a 60-79% right and 1-39% left  stenosis  Hyperlipidemia Cont med tx  GERD  HTN Stable on bisporolol, cardizem, lasix  Hypokalemia: replace k  Code Status: DO NOT RESUSCITATE Family Communication: patient--Called daughter and left VM on 3/29 alerting her to imminent d/c to [facility] 24-48 hours Disposition Plan:   Await IJ for long term abx use ( discussed with patient about picc and ckd, he elected to proceed with IJ)  Consultants: [facility] Cards  Neurology ID  Procedures: 3/9 TTE - EF 60-65% - Aortic valve: A bioprosthesis was present and functioning normally - Mitral valve: severe stenosis. Mean gradient (D): 11 mm Hg - Right ventricle: size was normal. Wall thickness normal. Systolic function was normal - Tricuspid valve: trivial regurgitation.  TEE 3/22 cancelled due to hypoxia, TEE 3/28  Antibiotics: levaquin 3/7 > 3/15 vanc on 3/19 and 20 Zosyn 3/19-3/21 ampicillin from 3/21, need total of 6 weeks abx per ID  DVT prophylaxis: Subcutaneous heparin  Objective: Blood pressure 112/61, pulse 93, temperature 98.1 F (36.7 C), temperature source Oral, resp. rate 20, height _0  (1.753 m), weight 87.363 kg (  192 lb 9.6 oz), SpO2 95 %.  Intake/Output Summary (Last 24 hours) at 01/16/16 1200 Last data filed at 01/16/16 0951  Gross per 24 hour  Intake    660 ml  Output    400 ml  Net    260 ml   Filed Weights   01/14/16 1225 01/15/16 0545 01/16/16 0500  Weight: 92.035 kg (202 lb 14.4 oz) 89.132 kg (196 lb 8 oz) 87.363 kg (192 lb 9.6 oz)   Exam: General: pleasant , aaox3. Poor dentition Lungs: less bilateral wheezing , + bibasilar crackles Cardiovascular: IRRR Abdomen: NT/ND, soft, bs+, no mass, no rebound  Extremities: no significant cyanosis, or clubbing - 2+ edema bilateral lower extremities documented before has resolved Neuro: aaox3, pleasant, right lower extremity weaker than left, right lower extremity barely able to lift against resistance ? Slurred speech, baseline?  Data Reviewed:  Basic  Metabolic Panel:  Recent Labs Lab 01/10/16 0300  01/12/16 0302 01/13/16 0402 01/14/16 0559 01/15/16 0222 01/16/16 0221  NA 139  < > 135 137 137 137 138  K 4.2  < > 3.9 3.5 4.0 3.5 3.3*  CL 100*  < > 98* 100* 101 102 100*  CO2 29  < > _0 GLUCOSE 381*  < > 242* 200* 189* 158* 82  BUN 36*  < > 40* 38* 32* 35* 31*  CREATININE 1.98*  < > 2.19* 2.20* 2.04* 2.08* 2.07*  CALCIUM 8.0*  < > 7.7* 8.0* 8.0* 7.9* 8.1*  MG 1.7  --   --   --   --   --   --   < > = values in this interval not displayed.  CBC:  Recent Labs Lab 01/10/16 0300 01/15/16 0222 01/16/16 0221  WBC 3.0* 3.7* 4.2  HGB 9.4* 8.9* 9.7*  HCT 29.3* 28.0* 30.4*  MCV 94.2 91.2 91.8  PLT 135* 146* 170   Liver Function Tests: No results for input(s): AST, ALT, ALKPHOS, BILITOT, PROT, ALBUMIN in the last 168 hours. CBG:  Recent Labs Lab 01/15/16 1632 01/15/16 1653 01/15/16 2219 01/16/16 0621 01/16/16 1145  GLUCAP 210* 215* 98 80 217*    Recent Results (from the past 240 hour(s))  Culture, blood (routine x 2)     Status: None   Collection Time: 01/06/16  5:19 PM  Result Value Ref Range Status   Specimen Description BLOOD RIGHT ARM  Final   Special Requests   Final    BOTTLES DRAWN AEROBIC AND ANAEROBIC 6CC BLUE 5CC PURPLE   Culture  Setup Time   Final    IN BOTH AEROBIC AND ANAEROBIC BOTTLES GRAM POSITIVE COCCI CRITICAL RESULT CALLED TO, READ BACK BY AND VERIFIED WITH: N.WILEY,RN [id number] 01/07/16 M.CAMPBELL    Culture   Final    GROUP B STREP(S.AGALACTIAE)ISOLATED SUSCEPTIBILITIES PERFORMED ON PREVIOUS CULTURE WITHIN THE LAST 5 DAYS.    Report Status 01/09/2016 FINAL  Final  Culture, blood (routine x 2)     Status: None   Collection Time: 01/06/16  5:28 PM  Result Value Ref Range Status   Specimen Description BLOOD LEFT ARM  Final   Special Requests BOTTLES DRAWN AEROBIC AND ANAEROBIC 10CC  Final   Culture  Setup Time   Final    IN BOTH AEROBIC AND ANAEROBIC BOTTLES GRAM POSITIVE COCCI IN  CHAINS IN CLUSTERS CRITICAL RESULT CALLED TO, READ BACK BY AND VERIFIED WITH: G.RASUL,RN [id number] 01/07/16 M.CAMPBELL    Culture GROUP B STREP(S.AGALACTIAE)ISOLATED  Final   Report Status  01/09/2016 FINAL  Final   Organism ID, Bacteria GROUP B STREP(S.AGALACTIAE)ISOLATED  Final      Susceptibility   Group b strep(s.agalactiae)isolated - MIC*    CLINDAMYCIN >=1 RESISTANT Resistant     AMPICILLIN <=0.25 SENSITIVE Sensitive     ERYTHROMYCIN >=8 RESISTANT Resistant     VANCOMYCIN 0.5 SENSITIVE Sensitive     CEFTRIAXONE <=0.12 SENSITIVE Sensitive     LEVOFLOXACIN 4 INTERMEDIATE Intermediate     * GROUP B STREP(S.AGALACTIAE)ISOLATED  Urine culture     Status: None   Collection Time: 01/07/16 12:50 AM  Result Value Ref Range Status   Specimen Description URINE, RANDOM  Final   Special Requests NONE  Final   Culture MULTIPLE SPECIES PRESENT, SUGGEST RECOLLECTION  Final   Report Status 01/08/2016 FINAL  Final  Culture, blood (routine x 2)     Status: None   Collection Time: 01/08/16  3:27 PM  Result Value Ref Range Status   Specimen Description BLOOD LEFT ANTECUBITAL  Final   Special Requests BOTTLES DRAWN AEROBIC AND ANAEROBIC 5CC EACH  Final   Culture NO GROWTH 5 DAYS  Final   Report Status 01/13/2016 FINAL  Final  Culture, blood (routine x 2)     Status: None   Collection Time: 01/08/16  3:33 PM  Result Value Ref Range Status   Specimen Description BLOOD RIGHT HAND  Final   Special Requests BOTTLES DRAWN AEROBIC ONLY 5CC  Final   Culture NO GROWTH 5 DAYS  Final   Report Status 01/13/2016 FINAL  Final    Studies:   Recent x-ray studies have been reviewed in detail by the Attending Physician  Scheduled Meds:  Scheduled Meds: . ampicillin (OMNIPEN) IV  2 g Intravenous 4 times per day  . antiseptic oral rinse  7 mL Mouth Rinse BID  . aspirin  324 mg Oral Daily  . atorvastatin  20 mg Oral q1800  . bisoprolol  2.5 mg Oral Daily  . budesonide (PULMICORT) nebulizer solution  0.25 mg  Nebulization BID  . diltiazem  120 mg Oral 3 times per day  . feeding supplement (GLUCERNA SHAKE)  237 mL Oral TID BM  . furosemide  40 mg Intravenous Daily  . guaiFENesin  600 mg Oral BID  . heparin subcutaneous  5,000 Units Subcutaneous 3 times per day  . insulin aspart  0-20 Units Subcutaneous TID WC  . insulin aspart  0-5 Units Subcutaneous QHS  . insulin aspart  4 Units Subcutaneous TID WC  . insulin glargine  30 Units Subcutaneous Daily  . levalbuterol  0.63 mg Nebulization TID  . multivitamin with minerals  1 tablet Oral Daily  . pantoprazole  40 mg Oral Daily  . polyethylene glycol  17 g Oral BID  . predniSONE  20 mg Oral Q breakfast  . sodium chloride flush  3 mL Intravenous Q12H    Time spent on care of this patient: 25 mins  Verneita Griffes, MD [facility] [phone]

## 2016-01-16 NOTE — Progress Notes (Signed)
OT NOTE  Pt with nose bleed with neck flexion head down to rinse hair at sink level in the steady. Pt reports "i can breath better now" Question if water running over face during task was rinsing blood clot in L side of nose giving appears of active bleed. MD XU present and recommended Vaseline by RN for nose soreness.   Jeri Modena   OTR/L Pager: [phone] Office: [phone] .

## 2016-01-16 NOTE — Progress Notes (Signed)
Physical Therapy Treatment Patient Details Name: Eric [name] MRN: [id number] DOB: [birth year] Today's Date: 01/16/2016    History of Present Illness 82 yo presented with 2 day history of generalized weakness which resulted in patient falling out of bed 2 days ago. Developed hip pain from this fall which persisted. History of hip fracture and surgical repair. patient has had new onset wheezing and coughing with occasional phlegm production. pt presents with A-flutter, COPD Exacerbation, and N/V.  pt with hx of AVR, CABG, HTN, DM, Shingles, and L THR.  Pt with A-fib with RVR. COPD/Bronchitis.     PT Comments    Patient seen for mobility progression. Patient very reluctant stating her is too weak. Patient did tolerated OOB to chair via stedy and performed standing with stedy x3 but was not agreeable to attempt RW or ambulation. Patient reported dizziness, BP assessed and stable. 110s/60s, HR 74, O2 saturations stable on 3 liters 94%. Encouraged patient to continue OOB daily with staff.   Follow Up Recommendations  SNF ([facility] )     Equipment Recommendations  None recommended by PT    Recommendations for Other Services       Precautions / Restrictions Precautions Precautions: Fall Precaution Comments: watch O2 Sats` Restrictions Weight Bearing Restrictions: No    Mobility  Bed Mobility Overal bed mobility: Needs Assistance Bed Mobility: Rolling;Supine to Sit Rolling: Mod assist   Supine to sit: Max assist     General bed mobility comments: Patient required assist to elevate trunk to upright and rotate hips to EOB. Increased time and effort to perform. Patient was able to bring LEs to EOB but required max cues to perform secondary to reluctance initially  Transfers Overall transfer level: Needs assistance   Transfers: Sit to/from Stand Sit to Stand: Mod assist         General transfer comment: Patient required cues to initiate power up to standing but required  less assist to perform. Patient able to maintain standing with stedy for approximately 3 minutes before fatigue. Max cues for encouragement to maintain upright. Able to perform power up x3 this session with 2 seated rest breaks  Ambulation/Gait             General Gait Details: patient not agreeable to attempt ambulatino, willing to OOB to chair via stedy   Stairs            Wheelchair Mobility    Modified Rankin (Stroke Patients Only)       Balance Overall balance assessment: Needs assistance         Standing balance support: Bilateral upper extremity supported;During functional activity Standing balance-Leahy Scale: Poor                      Cognition Arousal/Alertness: Awake/alert Behavior During Therapy: WFL for tasks assessed/performed Overall Cognitive Status: Impaired/Different from baseline Area of Impairment: Awareness           Awareness: Emergent        Exercises General Exercises - Lower Extremity Ankle Circles/Pumps: AROM;Both Other Exercises Other Exercises: Sit <> stand with stedy (knee block out by stedy machine and cues to faciliate posture    General Comments        Pertinent Vitals/Pain Pain Assessment: Faces Faces Pain Scale: Hurts a little bit    Home Living                      Prior Function  PT Goals (current goals can now be found in the care plan section) Acute Rehab PT Goals Patient Stated Goal: Go home with my son PT Goal Formulation: With patient Time For Goal Achievement: 01/23/16 Potential to Achieve Goals: Good Progress towards PT goals: Progressing toward goals    Frequency  Min 2X/week    PT Plan Current plan remains appropriate    Co-evaluation             End of Session Equipment Utilized During Treatment: Gait belt Activity Tolerance: Patient limited by fatigue Patient left: with chair alarm set;with call bell/phone within reach;in chair     Time:  1131-1148 PT Time Calculation (min) (ACUTE ONLY): 17 min  Charges:  $Therapeutic Activity: 8-22 mins                    G CodesDuncan [name] 02/15/16, 4:42 PM Alben Deeds, Eric [name] DPT  [phone]

## 2016-01-16 NOTE — Progress Notes (Signed)
SUBJECTIVE: No pain or SOB  Tele: atrial fib  BP 112/61 mmHg  Pulse 93  Temp(Src) 98.1 F (36.7 C) (Oral)  Resp 20  Ht 5' 9"  (1.753 m)  Wt 192 lb 9.6 oz (87.363 kg)  BMI 28.43 kg/m2  SpO2 95%  Intake/Output Summary (Last 24 hours) at 01/16/16 1153 Last data filed at 01/16/16 0951  Gross per 24 hour  Intake    660 ml  Output    400 ml  Net    260 ml    PHYSICAL EXAM General: Well developed, well nourished, in no acute distress. Alert and oriented x 3.  Psych:  Good affect, responds appropriately Neck: No JVD. No masses noted.  Lungs: Clear bilaterally with no wheezes or rhonci noted.  Heart: irreg irreg with systolic murmur noted.  Abdomen: Bowel sounds are present. Soft, non-tender.  Extremities: No lower extremity edema.   LABS: Basic Metabolic Panel:  Recent Labs  01/15/16 0222 01/16/16 0221  NA 137 138  K 3.5 3.3*  CL 102 100*  CO2 27 30  GLUCOSE 158* 82  BUN 35* 31*  CREATININE 2.08* 2.07*  CALCIUM 7.9* 8.1*   CBC:  Recent Labs  01/15/16 0222 01/16/16 0221  WBC 3.7* 4.2  HGB 8.9* 9.7*  HCT 28.0* 30.4*  MCV 91.2 91.8  PLT 146* 170   Current Meds: . ampicillin (OMNIPEN) IV  2 g Intravenous 4 times per day  . antiseptic oral rinse  7 mL Mouth Rinse BID  . aspirin  324 mg Oral Daily  . atorvastatin  20 mg Oral q1800  . bisoprolol  2.5 mg Oral Daily  . budesonide (PULMICORT) nebulizer solution  0.25 mg Nebulization BID  . diltiazem  120 mg Oral 3 times per day  . feeding supplement (GLUCERNA SHAKE)  237 mL Oral TID BM  . furosemide  40 mg Intravenous Daily  . guaiFENesin  600 mg Oral BID  . heparin subcutaneous  5,000 Units Subcutaneous 3 times per day  . insulin aspart  0-20 Units Subcutaneous TID WC  . insulin aspart  0-5 Units Subcutaneous QHS  . insulin aspart  4 Units Subcutaneous TID WC  . insulin glargine  30 Units Subcutaneous Daily  . levalbuterol  0.63 mg Nebulization TID  . multivitamin with minerals  1 tablet Oral Daily    . pantoprazole  40 mg Oral Daily  . polyethylene glycol  17 g Oral BID  . predniSONE  20 mg Oral Q breakfast  . sodium chloride flush  3 mL Intravenous Q12H   TEE 01/15/16: Normal LV size with mild LV hypertrophy. EF 55-60%. Normal RV size and systolic function. Trivial tricuspid regurgitation, no vegetation. There was a bioprosthetic aortic valve with no vegetation. The valve opened normally, no regurgitation. Normal pulmonic valve. There was moderate to severe mitral annular calcification. Probably mild mitral stenosis, mean gradient 5 mmHg. There was a 2 cm vegetation attached to the posterior mitral valve leaflet. There was moderate mitral regurgitation. Mild biatrial enlargement. No LA appendage thrombus. There was lipomatous atrial septal hypertrophy, no PFO or ASD with negative bubble study. The aorta was normal in caliber with grade III plaque in the descending thoracic aorta.   ASSESSMENT AND PLAN: 82 y.o. male w/ PMH of CAD (s/p CABG 2010), aortic stenosis (s/p AVR 2010), Type 2 DM, HTN, and HLD admitted on 12/25/2015 for worsening cough and wheezing. Met SIRS criteria, diagnosed with PNA, now positive for strep bacteremia. Cards consulted for new-onset  atrial fibrillation w/ RVR. Pt had CVA 01/06/16. TEE 01/15/16 with MV vegetation.   1. New onset Atrial Fibrillation w/ RVR: Rate controlled on Cardizem CD 120 mg daily and Bisoprolol 2.5 mg daily. CHADSVASC score 7. He has not been on coumadin while awaiting TEE 01/15/16.    2. CAD s/p CABG without angina: Stable. Will continue ASA, Statin, and beta blocker. Would continue with Atorvastatin at time of discharge due to interaction of Cardizem and PTA Simvastatin.  3. Aortic Stenosis: s/p AVR in 2010. Functioning normally on recent echocardiogram  4. Acute CVA: MRI on 01/06/2016 noted multiple small areas of acute infarct including the right cerebellum, right basal ganglia, and right parietal lobe. TEE 01/15/16 with Mitral valve  vegetation. He has been seen by ID and recs for 6 weeks IV abx. He also has new atrial fib with CHADS VASC score of 7. With recent CVA, will need Neurology recs before starting coumadin.     5. Acute endocarditis/Strept Bacteremia: He is now on IV antibiotics per ID recs for 6 weeks. Will need repeat echo in 6 weeks. He is not a surgical candidate for MVR so will not call CT surgery.   MCALHANY,CHRISTOPHER  3/29/201711:53 AM

## 2016-01-16 NOTE — Progress Notes (Signed)
Nutrition Follow-up  DOCUMENTATION CODES:   Obesity unspecified  INTERVENTION:  -Continue Glucerna Shake TID, 220 kcal, 10 grams of protein per bottle  NUTRITION DIAGNOSIS:   Inadequate oral intake related to poor appetite as evidenced by meal completion < 50%.  -Ongoing   GOAL:   Patient will meet greater than or equal to 90% of their needs  -Progressing  MONITOR:   PO intake, Supplement acceptance, Diet advancement, Labs, Weight trends, Skin, I & O's  ASSESSMENT:   Patient reports 2 day history of generalized weakness which resulted in patient falling out of bed 2 days ago. Developed hip pain from this fall which persisted into today. History of hip fracture and surgical repair. Over the last 2 days patient has had new onset wheezing and coughing with occasional phlegm production. Denies any fevers, chest pain, palpitations, LOC, nausea, vomiting. Patient does endorse runny nose and upper respiratory congestion. Very poor appetite during this period time. Very little oral intake is pushing fluids. Patient states that he smoked for 50 years approximately one pack per day but quit approximately 20 years ago.  Per chart PO intake is 0%-50%. Pt reports he does not eat much, but enjoys the food here. Pt is consuming his Glucerna Shake, he states he is sipping it through out the day. Pt reports he is going to [facility] upon discharge.   Per chart weight stable  Medications reviewed; multivitamin with minerals 1 tablet daily.  Labs reviewed; potassium (3.3) low, CBG 80-329  Diet Order:  DIET DYS 3 Room service appropriate?: Yes; Fluid consistency:: Thin  Skin:  Wound (see comment) (stage I PU to buttocks)  Last BM:  01/12/2016  Height:   Ht Readings from Last 1 Encounters:  12/25/15 5\' 9"  (1.753 m)    Weight:   Wt Readings from Last 1 Encounters:  01/16/16 192 lb 9.6 oz (87.363 kg)    Ideal Body Weight:  72.7 kg  BMI:  Body mass index is 28.43  kg/(m^2).  Estimated Nutritional Needs:   Kcal:  1800-2000  Protein:  85-100 grams  Fluid:  1.8-2.0 L  EDUCATION NEEDS:   Education needs addressed  Raford Pitcher, Dietetic Intern Pager: [phone]

## 2016-01-17 ENCOUNTER — Inpatient Hospital Stay (HOSPITAL_COMMUNITY): Payer: Medicare Other

## 2016-01-17 DIAGNOSIS — I509 Heart failure, unspecified: Secondary | ICD-10-CM | POA: Diagnosis not present

## 2016-01-17 DIAGNOSIS — M6281 Muscle weakness (generalized): Secondary | ICD-10-CM | POA: Diagnosis not present

## 2016-01-17 DIAGNOSIS — I679 Cerebrovascular disease, unspecified: Secondary | ICD-10-CM | POA: Diagnosis not present

## 2016-01-17 DIAGNOSIS — Z9181 History of falling: Secondary | ICD-10-CM | POA: Diagnosis not present

## 2016-01-17 DIAGNOSIS — D473 Essential (hemorrhagic) thrombocythemia: Secondary | ICD-10-CM | POA: Diagnosis not present

## 2016-01-17 DIAGNOSIS — K8020 Calculus of gallbladder without cholecystitis without obstruction: Secondary | ICD-10-CM | POA: Diagnosis not present

## 2016-01-17 DIAGNOSIS — R1312 Dysphagia, oropharyngeal phase: Secondary | ICD-10-CM | POA: Diagnosis not present

## 2016-01-17 DIAGNOSIS — R0682 Tachypnea, not elsewhere classified: Secondary | ICD-10-CM | POA: Diagnosis not present

## 2016-01-17 DIAGNOSIS — D72829 Elevated white blood cell count, unspecified: Secondary | ICD-10-CM | POA: Diagnosis not present

## 2016-01-17 DIAGNOSIS — D721 Eosinophilia: Secondary | ICD-10-CM | POA: Diagnosis not present

## 2016-01-17 DIAGNOSIS — Z8719 Personal history of other diseases of the digestive system: Secondary | ICD-10-CM | POA: Diagnosis not present

## 2016-01-17 DIAGNOSIS — R41841 Cognitive communication deficit: Secondary | ICD-10-CM | POA: Diagnosis not present

## 2016-01-17 DIAGNOSIS — I69322 Dysarthria following cerebral infarction: Secondary | ICD-10-CM | POA: Diagnosis not present

## 2016-01-17 DIAGNOSIS — Z8619 Personal history of other infectious and parasitic diseases: Secondary | ICD-10-CM | POA: Diagnosis not present

## 2016-01-17 DIAGNOSIS — Z951 Presence of aortocoronary bypass graft: Secondary | ICD-10-CM | POA: Diagnosis not present

## 2016-01-17 DIAGNOSIS — R531 Weakness: Secondary | ICD-10-CM | POA: Diagnosis not present

## 2016-01-17 DIAGNOSIS — I272 Other secondary pulmonary hypertension: Secondary | ICD-10-CM | POA: Diagnosis not present

## 2016-01-17 DIAGNOSIS — I420 Dilated cardiomyopathy: Secondary | ICD-10-CM | POA: Diagnosis not present

## 2016-01-17 DIAGNOSIS — I330 Acute and subacute infective endocarditis: Secondary | ICD-10-CM | POA: Diagnosis not present

## 2016-01-17 DIAGNOSIS — E46 Unspecified protein-calorie malnutrition: Secondary | ICD-10-CM | POA: Diagnosis not present

## 2016-01-17 DIAGNOSIS — N183 Chronic kidney disease, stage 3 (moderate): Secondary | ICD-10-CM | POA: Diagnosis not present

## 2016-01-17 DIAGNOSIS — R5381 Other malaise: Secondary | ICD-10-CM | POA: Diagnosis not present

## 2016-01-17 DIAGNOSIS — I6340 Cerebral infarction due to embolism of unspecified cerebral artery: Secondary | ICD-10-CM | POA: Diagnosis not present

## 2016-01-17 DIAGNOSIS — L89311 Pressure ulcer of right buttock, stage 1: Secondary | ICD-10-CM | POA: Diagnosis not present

## 2016-01-17 DIAGNOSIS — I071 Rheumatic tricuspid insufficiency: Secondary | ICD-10-CM | POA: Diagnosis not present

## 2016-01-17 DIAGNOSIS — Z87448 Personal history of other diseases of urinary system: Secondary | ICD-10-CM | POA: Diagnosis not present

## 2016-01-17 DIAGNOSIS — I2510 Atherosclerotic heart disease of native coronary artery without angina pectoris: Secondary | ICD-10-CM | POA: Diagnosis not present

## 2016-01-17 DIAGNOSIS — Z794 Long term (current) use of insulin: Secondary | ICD-10-CM | POA: Diagnosis not present

## 2016-01-17 DIAGNOSIS — E118 Type 2 diabetes mellitus with unspecified complications: Secondary | ICD-10-CM | POA: Diagnosis not present

## 2016-01-17 DIAGNOSIS — I339 Acute and subacute endocarditis, unspecified: Secondary | ICD-10-CM | POA: Diagnosis not present

## 2016-01-17 DIAGNOSIS — R112 Nausea with vomiting, unspecified: Secondary | ICD-10-CM | POA: Diagnosis not present

## 2016-01-17 DIAGNOSIS — J80 Acute respiratory distress syndrome: Secondary | ICD-10-CM | POA: Diagnosis not present

## 2016-01-17 DIAGNOSIS — D509 Iron deficiency anemia, unspecified: Secondary | ICD-10-CM | POA: Diagnosis not present

## 2016-01-17 DIAGNOSIS — Z79899 Other long term (current) drug therapy: Secondary | ICD-10-CM | POA: Diagnosis not present

## 2016-01-17 DIAGNOSIS — E1140 Type 2 diabetes mellitus with diabetic neuropathy, unspecified: Secondary | ICD-10-CM | POA: Diagnosis not present

## 2016-01-17 DIAGNOSIS — E119 Type 2 diabetes mellitus without complications: Secondary | ICD-10-CM | POA: Diagnosis not present

## 2016-01-17 DIAGNOSIS — R0600 Dyspnea, unspecified: Secondary | ICD-10-CM | POA: Diagnosis not present

## 2016-01-17 DIAGNOSIS — Z87891 Personal history of nicotine dependence: Secondary | ICD-10-CM | POA: Diagnosis not present

## 2016-01-17 DIAGNOSIS — I69321 Dysphasia following cerebral infarction: Secondary | ICD-10-CM | POA: Diagnosis not present

## 2016-01-17 DIAGNOSIS — R404 Transient alteration of awareness: Secondary | ICD-10-CM | POA: Diagnosis not present

## 2016-01-17 DIAGNOSIS — R262 Difficulty in walking, not elsewhere classified: Secondary | ICD-10-CM | POA: Diagnosis not present

## 2016-01-17 DIAGNOSIS — I052 Rheumatic mitral stenosis with insufficiency: Secondary | ICD-10-CM | POA: Diagnosis not present

## 2016-01-17 DIAGNOSIS — I5033 Acute on chronic diastolic (congestive) heart failure: Secondary | ICD-10-CM | POA: Diagnosis not present

## 2016-01-17 DIAGNOSIS — I10 Essential (primary) hypertension: Secondary | ICD-10-CM | POA: Diagnosis not present

## 2016-01-17 DIAGNOSIS — Z452 Encounter for adjustment and management of vascular access device: Secondary | ICD-10-CM | POA: Diagnosis not present

## 2016-01-17 DIAGNOSIS — I358 Other nonrheumatic aortic valve disorders: Secondary | ICD-10-CM | POA: Diagnosis not present

## 2016-01-17 DIAGNOSIS — R278 Other lack of coordination: Secondary | ICD-10-CM | POA: Diagnosis not present

## 2016-01-17 DIAGNOSIS — D638 Anemia in other chronic diseases classified elsewhere: Secondary | ICD-10-CM | POA: Diagnosis not present

## 2016-01-17 DIAGNOSIS — R509 Fever, unspecified: Secondary | ICD-10-CM | POA: Diagnosis not present

## 2016-01-17 DIAGNOSIS — K219 Gastro-esophageal reflux disease without esophagitis: Secondary | ICD-10-CM | POA: Diagnosis not present

## 2016-01-17 DIAGNOSIS — Z7982 Long term (current) use of aspirin: Secondary | ICD-10-CM | POA: Diagnosis not present

## 2016-01-17 DIAGNOSIS — L538 Other specified erythematous conditions: Secondary | ICD-10-CM | POA: Diagnosis not present

## 2016-01-17 DIAGNOSIS — I4891 Unspecified atrial fibrillation: Secondary | ICD-10-CM | POA: Diagnosis not present

## 2016-01-17 DIAGNOSIS — R402411 Glasgow coma scale score 13-15, in the field [EMT or ambulance]: Secondary | ICD-10-CM | POA: Diagnosis not present

## 2016-01-17 DIAGNOSIS — I119 Hypertensive heart disease without heart failure: Secondary | ICD-10-CM | POA: Diagnosis not present

## 2016-01-17 DIAGNOSIS — Z86010 Personal history of colonic polyps: Secondary | ICD-10-CM | POA: Diagnosis not present

## 2016-01-17 DIAGNOSIS — I639 Cerebral infarction, unspecified: Secondary | ICD-10-CM | POA: Diagnosis not present

## 2016-01-17 DIAGNOSIS — E1149 Type 2 diabetes mellitus with other diabetic neurological complication: Secondary | ICD-10-CM | POA: Diagnosis not present

## 2016-01-17 DIAGNOSIS — I480 Paroxysmal atrial fibrillation: Secondary | ICD-10-CM | POA: Diagnosis not present

## 2016-01-17 DIAGNOSIS — J441 Chronic obstructive pulmonary disease with (acute) exacerbation: Secondary | ICD-10-CM | POA: Diagnosis not present

## 2016-01-17 DIAGNOSIS — I38 Endocarditis, valve unspecified: Secondary | ICD-10-CM | POA: Diagnosis not present

## 2016-01-17 DIAGNOSIS — A401 Sepsis due to streptococcus, group B: Secondary | ICD-10-CM | POA: Diagnosis not present

## 2016-01-17 DIAGNOSIS — E7800 Pure hypercholesterolemia, unspecified: Secondary | ICD-10-CM | POA: Diagnosis not present

## 2016-01-17 DIAGNOSIS — R1310 Dysphagia, unspecified: Secondary | ICD-10-CM | POA: Diagnosis not present

## 2016-01-17 DIAGNOSIS — R4182 Altered mental status, unspecified: Secondary | ICD-10-CM | POA: Diagnosis not present

## 2016-01-17 DIAGNOSIS — J9601 Acute respiratory failure with hypoxia: Secondary | ICD-10-CM | POA: Diagnosis not present

## 2016-01-17 LAB — CBC WITH DIFFERENTIAL/PLATELET
Basophils Absolute: 0 10*3/uL (ref 0.0–0.1)
Basophils Relative: 0 %
EOS ABS: 0 10*3/uL (ref 0.0–0.7)
EOS PCT: 1 %
HCT: 31.6 % — ABNORMAL LOW (ref 39.0–52.0)
Hemoglobin: 10.2 g/dL — ABNORMAL LOW (ref 13.0–17.0)
LYMPHS ABS: 0.5 10*3/uL — AB (ref 0.7–4.0)
Lymphocytes Relative: 11 %
MCH: 29.4 pg (ref 26.0–34.0)
MCHC: 32.3 g/dL (ref 30.0–36.0)
MCV: 91.1 fL (ref 78.0–100.0)
MONO ABS: 0.1 10*3/uL (ref 0.1–1.0)
MONOS PCT: 2 %
Neutro Abs: 3.8 10*3/uL (ref 1.7–7.7)
Neutrophils Relative %: 86 %
PLATELETS: 177 10*3/uL (ref 150–400)
RBC: 3.47 MIL/uL — AB (ref 4.22–5.81)
RDW: 14.7 % (ref 11.5–15.5)
WBC: 4.4 10*3/uL (ref 4.0–10.5)

## 2016-01-17 LAB — BASIC METABOLIC PANEL
Anion gap: 7 (ref 5–15)
BUN: 30 mg/dL — AB (ref 4–21)
BUN: 30 mg/dL — AB (ref 6–20)
CHLORIDE: 100 mmol/L — AB (ref 101–111)
CO2: 29 mmol/L (ref 22–32)
CREATININE: 2 mg/dL — AB (ref 0.6–1.3)
CREATININE: 2.03 mg/dL — AB (ref 0.61–1.24)
Calcium: 8.5 mg/dL — ABNORMAL LOW (ref 8.9–10.3)
GFR calc Af Amer: 32 mL/min — ABNORMAL LOW (ref >60)
GFR calc non Af Amer: 28 mL/min — ABNORMAL LOW (ref >60)
Glucose, Bld: 165 mg/dL — ABNORMAL HIGH (ref 65–99)
Glucose: 165 mg/dL
Potassium: 3.8 mmol/L (ref 3.5–5.1)
SODIUM: 136 mmol/L — AB (ref 137–147)
SODIUM: 136 mmol/L (ref 135–145)

## 2016-01-17 LAB — PROTIME-INR
INR: 1.12 (ref 0.00–1.49)
PROTHROMBIN TIME: 14.6 s (ref 11.6–15.2)

## 2016-01-17 LAB — CBC AND DIFFERENTIAL: WBC: 4.4 10*3/mL

## 2016-01-17 LAB — GLUCOSE, CAPILLARY
GLUCOSE-CAPILLARY: 151 mg/dL — AB (ref 65–99)
GLUCOSE-CAPILLARY: 156 mg/dL — AB (ref 65–99)
Glucose-Capillary: 187 mg/dL — ABNORMAL HIGH (ref 65–99)

## 2016-01-17 MED ORDER — INSULIN GLARGINE 100 UNIT/ML ~~LOC~~ SOLN: 30.0000 [IU] | Freq: Every day | SUBCUTANEOUS | Status: DC

## 2016-01-17 MED ORDER — INSULIN ASPART 100 UNIT/ML ~~LOC~~ SOLN: 4.0000 [IU] | Freq: Three times a day (TID) | SUBCUTANEOUS | Status: DC

## 2016-01-17 MED ORDER — ASPIRIN 81 MG PO CHEW: 324.0000 mg | CHEWABLE_TABLET | Freq: Every day | ORAL | Status: DC

## 2016-01-17 MED ORDER — ATORVASTATIN CALCIUM 20 MG PO TABS: 20.0000 mg | ORAL_TABLET | Freq: Every day | ORAL | Status: DC

## 2016-01-17 MED ORDER — PRO-STAT SUGAR FREE PO LIQD: 30.0000 mL | Freq: Two times a day (BID) | ORAL | Status: DC

## 2016-01-17 MED ORDER — FUROSEMIDE 20 MG PO TABS: 60.0000 mg | ORAL_TABLET | Freq: Every day | ORAL | Status: DC

## 2016-01-17 MED ORDER — SIMETHICONE 40 MG/0.6ML PO SUSP: 40.0000 mg | Freq: Four times a day (QID) | ORAL | Status: DC | PRN

## 2016-01-17 MED ORDER — BISOPROLOL FUMARATE 5 MG PO TABS: 2.5000 mg | ORAL_TABLET | Freq: Every day | ORAL | Status: DC

## 2016-01-17 MED ORDER — DILTIAZEM HCL 120 MG PO TABS: 120.0000 mg | ORAL_TABLET | Freq: Three times a day (TID) | ORAL | Status: DC

## 2016-01-17 MED ORDER — LIDOCAINE HCL 1 % IJ SOLN
INTRAMUSCULAR | Status: AC
  Filled 2016-01-17: qty 20

## 2016-01-17 MED ORDER — PREDNISONE 10 MG PO TABS: 10.0000 mg | ORAL_TABLET | Freq: Every day | ORAL | Status: DC

## 2016-01-17 MED ORDER — SODIUM CHLORIDE 0.9 % IV SOLN: 2.0000 g | Freq: Four times a day (QID) | INTRAVENOUS | Status: DC

## 2016-01-17 MED ORDER — LEVALBUTEROL HCL 0.63 MG/3ML IN NEBU
INHALATION_SOLUTION | RESPIRATORY_TRACT | Status: AC
  Filled 2016-01-17: qty 3

## 2016-01-17 MED ORDER — GLUCERNA SHAKE PO LIQD: 237.0000 mL | Freq: Three times a day (TID) | ORAL | Status: DC

## 2016-01-17 NOTE — Progress Notes (Signed)
Patient's daughter stated that [facility] called her and does not have the medication Tamiflu in stock. [facility] called and they have the medication in stock. CM called [facility], they will fax the prescription to [facility] to be filled. Daughter will pick up the medication. Mindi Slicker [facility] [phone]

## 2016-01-17 NOTE — Progress Notes (Signed)
Attempted to call report x2 no answer from answering service.  Karie Kirks, Therapist, sports.

## 2016-01-17 NOTE — Procedures (Signed)
Placement of tunneled central line in right IJ.  Tip in lower SVC and ready to use.  No immediate complication.

## 2016-01-17 NOTE — Progress Notes (Signed)
Pt is currently off floor in [place].  Will continue to monitor.  Karie Kirks, Therapist, sports.

## 2016-01-17 NOTE — Progress Notes (Signed)
Patient will discharge to [facility] Anticipated discharge date:3/30 Family notified: left voicemail for dtr that [facility] had been called- had informed earlier in the day Transportation by [place]- called at [place] signing off.  Domenica Reamer, [place] Social Worker [phone]

## 2016-01-17 NOTE — Care Management Important Message (Signed)
Important Message  Patient Details  Name: Eric [name] MRN: [id number] Date of Birth: 01-28-1927   Medicare Important Message Given:  Yes    Lakendra Helling P [place] 01/17/2016, 11:33 AM

## 2016-01-17 NOTE — Progress Notes (Signed)
SUBJECTIVE: No chest pain or SOB  BP 122/56 mmHg  Pulse 70  Temp(Src) 98.4 F (36.9 C) (Oral)  Resp 22  Ht 5' 9"  (1.753 m)  Wt 193 lb (87.544 kg)  BMI 28.49 kg/m2  SpO2 95%  Intake/Output Summary (Last 24 hours) at 01/17/16 0817 Last data filed at 01/17/16 0865  Gross per 24 hour  Intake    747 ml  Output   1200 ml  Net   -453 ml    PHYSICAL EXAM General: Well developed, well nourished, in no acute distress. Alert and oriented x 3.  Psych:  Good affect, responds appropriately Neck: No JVD. No masses noted.  Lungs: Clear bilaterally with no wheezes or rhonci noted.  Heart: irreg irreg with systolic murmur noted. Abdomen: Bowel sounds are present. Soft, non-tender.  Extremities: No lower extremity edema.   LABS: Basic Metabolic Panel:  Recent Labs  01/16/16 0221 01/17/16 0502  NA 138 136  K 3.3* 3.8  CL 100* 100*  CO2 30 29  GLUCOSE 82 165*  BUN 31* 30*  CREATININE 2.07* 2.03*  CALCIUM 8.1* 8.5*   CBC:  Recent Labs  01/16/16 0221 01/17/16 0502  WBC 4.2 4.4  NEUTROABS  --  3.8  HGB 9.7* 10.2*  HCT 30.4* 31.6*  MCV 91.8 91.1  PLT 170 177   Current Meds: . ampicillin (OMNIPEN) IV  2 g Intravenous 4 times per day  . antiseptic oral rinse  7 mL Mouth Rinse BID  . aspirin  324 mg Oral Daily  . atorvastatin  20 mg Oral q1800  . bisoprolol  2.5 mg Oral Daily  . budesonide (PULMICORT) nebulizer solution  0.25 mg Nebulization BID  . diltiazem  120 mg Oral 3 times per day  . feeding supplement (GLUCERNA SHAKE)  237 mL Oral TID BM  . feeding supplement (PRO-STAT SUGAR FREE 64)  30 mL Oral BID WC  . furosemide  60 mg Oral Daily  . guaiFENesin  600 mg Oral BID  . heparin subcutaneous  5,000 Units Subcutaneous 3 times per day  . insulin aspart  0-20 Units Subcutaneous TID WC  . insulin aspart  0-5 Units Subcutaneous QHS  . insulin aspart  4 Units Subcutaneous TID WC  . insulin glargine  30 Units Subcutaneous Daily  . levalbuterol  0.63 mg Nebulization  TID  . multivitamin with minerals  1 tablet Oral Daily  . pantoprazole  40 mg Oral Daily  . polyethylene glycol  17 g Oral BID  . predniSONE  10 mg Oral Q breakfast  . sodium chloride flush  3 mL Intravenous Q12H    TEE 01/15/16: Normal LV size with mild LV hypertrophy. EF 55-60%. Normal RV size and systolic function. Trivial tricuspid regurgitation, no vegetation. There was a bioprosthetic aortic valve with no vegetation. The valve opened normally, no regurgitation. Normal pulmonic valve. There was moderate to severe mitral annular calcification. Probably mild mitral stenosis, mean gradient 5 mmHg. There was a 2 cm vegetation attached to the posterior mitral valve leaflet. There was moderate mitral regurgitation. Mild biatrial enlargement. No LA appendage thrombus. There was lipomatous atrial septal hypertrophy, no PFO or ASD with negative bubble study. The aorta was normal in caliber with grade III plaque in the descending thoracic aorta.   ASSESSMENT AND PLAN: 82 y.o. male w/ PMH of CAD (s/p CABG 2010), aortic stenosis (s/p AVR 2010), Type 2 DM, HTN, and HLD admitted on 12/25/2015 for worsening cough and wheezing. Met SIRS criteria,  diagnosed with PNA, now positive for strep bacteremia. Cards consulted for new-onset atrial fibrillation w/ RVR. Pt had CVA 01/06/16. TEE 01/15/16 with MV vegetation.   1. New onset Atrial Fibrillation w/ RVR: Rate controlled on Cardizem CD 120 mg daily and Bisoprolol 2.5 mg daily. CHADSVASC score 7. He has not been on coumadin while awaiting TEE 01/15/16.   2. CAD s/p CABG without angina: Stable. Will continue ASA, Statin, and beta blocker. Would continue with Atorvastatin at time of discharge due to interaction of Cardizem and PTA Simvastatin.  3. Aortic Stenosis: s/p AVR in 2010. Functioning normally on recent echocardiogram  4. Acute CVA: MRI on 01/06/2016 noted multiple small areas of acute infarct including the right cerebellum, right basal ganglia,  and right parietal lobe. TEE 01/15/16 with Mitral valve vegetation. He has been seen by ID and recs for 6 weeks IV abx. He also has new atrial fib with CHADS VASC score of 7. With recent CVA, Neurology has been involved and arranged MRA of the head yesterday to exclude mycotic aneurysm. Recs to continue ASA for one week with repeat Blood cx in one week and if negative, stop ASA and start Eliquis.    5. Acute endocarditis/Strept Bacteremia: He is now on IV antibiotics per ID recs for 6 weeks. Will need repeat echo in 6 weeks. He is not a surgical candidate for MVR so will not call CT surgery.    Eric [name]  3/30/20178:17 AM

## 2016-01-17 NOTE — Discharge Summary (Addendum)
Physician Discharge Summary  Eric [name] STM:[id number] DOB: Feb 23, 1927 DOA: 12/25/2015  PCP: [name], MD  Admit date: 12/25/2015 Discharge date: 01/17/2016  Time spent: 50 minutes  Recommendations for Outpatient Follow-up:  1. Will need treatment of bacterial endocarditis with ampicillin until 02/18/2016-CRP, CBC plus differential and chem 12 to be reported to Dr. Linus Salmons at [facility] for infectious disease weekly as per home health protocol 2. The IJ line to be removed once treatment is complete 3. Cardiology to consider repeat TTE after treatment of endocarditis 4. Anticoagulation as follows-aspirin until/6/17 and then repeat blood cultures. If blood cultures remain negative okay to switch aspirin to Eliquis 2.5 twice a day given lowest risk among DOA sees for bleeding.-Skilled nursing facility to arrange both blood culture as well as transition of aspirin to novel oral anticoagulant. 5. Will need reevaluation by cardiology as an outpatient regarding A. fib with RVR, [place] score 7 as well as risk for falls-we will attempt to set up a transitional care visit within 2 weeks 6. Monitor blood sugars note that patient should be on Lantus and sliding scale supplementation-expect that his needs will go down given he was on prednisone for a long period of time during this hospital stay-we did taper him down to 10 mg on 3/29 and would suggest only 3 or 4 more days of this dose and then discontinuation. 7. Recommend repeat carotid Dopplers in the more distant future like in 6 months because of 60-79% right ICA stenosis-nursing home physician/PCP to arrange on discharge.Marland Kitchen  Discharge Diagnoses:  Principal Problem:   Acute respiratory distress ([name] Active Problems:   HYPERCHOLESTEROLEMIA   Bronchitis   Diabetes mellitus with complication ([name]   S/P CABG (coronary artery bypass graft)   S/P AVR (aortic valve replacement)   Atrial fibrillation with RVR ([name]   Multifocal atrial  tachycardia ([name]   Sepsis ([name]   COPD exacerbation ([name]   Metabolic acidosis   Dilated cardiomyopathy ([name]   Pulmonary hypertension ([name]   CAD in native artery   H/O aortic valve replacement   Uncontrolled type 2 diabetes mellitus with complication ([name]   Hyponatremia   Hypotension   Acute renal failure superimposed on stage 3 chronic kidney disease ([name]   Delirium   Acute renal failure ([name]   Cerebral thrombosis with cerebral infarction   Pressure ulcer   Slurred speech   Insulin dependent diabetes mellitus ([place])   Bacteremia   CVA (cerebral infarction)   Acute endocarditis   Discharge Condition: Improved   Diet recommendation: Dysphagia 3 diet  Filed Weights   01/15/16 0545 01/16/16 0500 01/17/16 0700  Weight: 89.132 kg (196 lb 8 oz) 87.363 kg (192 lb 9.6 oz) 87.544 kg (193 lb)    History of present illness:  82 y.o. male who c/o a 2 day history of generalized weakness which lead to him falling out of bed 2 days prior. He developed hip pain from this fall. He also noted the new onset wheezing and coughing with occasional phlegm production, and a runny nose w/ upper respiratory congestion. He also noted a very poor appetite with very little oral intake.   He came to the ER and was found to be tachycardic (had P waves - was irregular - possible MAT). He was treated with nebs with improvement of resp failure and tightness. His HR continued to climb to 135. He was given Cardizem 20 mg with BP dropping after. He was given a fluid bolus, and a dilt drip at 5  mg was begun.   Patient admitted with encephalopathy, SIRS, respiratory failure thought to be secondary to Heart failure exacerbation and presume COPD exacerbation. He was found to be in A fib RVR. He received Treatment for HF, and COPD exacerbation. His Mental status improved until 3-19 when he spike fever, and was notice to be disarthric by family. He underwent MRI which was positive for multiple acute stroke.  Also blood culture are positive for Strep B agalactiae. Chest x ray positive for PNA>   Anticoagulation will need to be revisited after TEE results. Follow neurology/cardiology recommendation regarding anticoagulation.  Need IJ placement for total of 6 weeks of abx per ID.  Hospital Course:  SIRS, Bacteremia, PNA: Strep Bacteremia, Agalactiae. + Mitral valve Vegetation on TEE 3/28 On admission patient met guidelines for SIRS w/ HR> 90, RR> 20, WBC> 12 - no infiltrate on CXR - no + culture results - UA not c/w UTI . LFT normal  Spike fever 3-19, Chest X ray: mild posterior left lower infiltrates.  abx history since admission: levaquin 3/7 > 3/15 vanc on 3/19 and 20 Zosyn 3/19-3/21 ampicillin from 3/21--->02/18/16 via IJ line  TEE on 3/28 =endocarditis . Patient with history of Aortic bioprosthetic valve replacement, Blood culture 3-19 growing: strep B agalactiae.  Rpt Cult x 2 NG x 5 days See above specific instructions re: Anticoagulation   Acute stroke, multiple infarcts;  Likely embolic, in setting of A fib but also Bacteremia. Per nueorlogy anticoagulation only after endocarditis ruled out. Currently on asa 3103m qd, Appreciate neurology input,   Per Dr. XErlinda Hongnotes 01/16/16-ASA + Abx for 1 more week then, as MRA ruled out mycotic aneurysm 3/29,  Repeat BC x 2 on 01/22/16 and then, If cultures remain neg, may start elliquis bid on 01/22/16 -If not-would have patient follow with Neurology as OP for further detemination of POC   Acute COPD exac - acute hypoxic resp failure  Wheezing on 3/22, repeat cxr with ? pulmonary edema, schedule nebulizer, add mucinex, trial of lasix,  3/23 less wheezing, taper steroids 3/24 wheezing almost resolved. 3/25 continue improving, no obvious wheezing on exam 3/28 may be approaching to baseline, intermittent mild wheezing, but + crackles, on lasix  A fib w/ RVR , CAHADSvasc score 7 ( age/htn/chf/dm2/cva) Cardiology following - IV cardizem now  transitioned to oral - TSH normal - high risk for anticoag given recent falls - Cards suggests ASA 325 for now and reconsider anticaog in outpt f/u - no amio due to severe lung disease  Continue with Bisoprolol. cardizem , she was on dig briefly, this was stopped on 3/18 by cardiology. Dr. RTyrell AntonioDiscussed wi-th family 3-20, patient prior to admission was active. He fell prior to admission because he was sick. Prior to admission , he used to go to grocery store, restaurants with assistance of son. They understand risk for bleeding and with risk of falling. Patient will be discharge to SNF.  Please see above  Acute mental status change -Multifactorial to include SIRS, Ativan, steroids, acute on chronic renal failure, sundowning  -CT head unrevealing initially  -Worsening MS 3-19, MRI positive for stroke. Spike fever 100.5 on 3-19. (last fever) aaox3 from 3/24 -Resolved currently  Dysphagia Cleared for D1 diet w/ thin liquids per SLP initially , continue aspiration precaution. Patient want to eat, report tired of eating puree diet. Nutrition consulted, continue speech therapy, now on soft diet and thin liquid (poor dentition) Now on dys 3 diet on 01/10/16  Insulin dependent DM2 -  acutely severely uncontrolled  A1c 7.1 - required insulin gtt earlier in stay - CBG reasonably controlled -  Continue adjust insulin as taper off steroids cbg 151-187 Continue Lantus 30, SSI   N/V - resolved  KUB noted gaseous distention of stomach on BIPAP - suspect bloating was mostly air blown in w/ BIPAP - simethicone - resolved   Constipation; better  Acute on CKD Stage 3 Baseline creatinine 1.6 - creatinine at presentation 2.58 w/ peak of 3.12 - avoid ACE/ARB - crt now at ~baseline  Filed Weights   01/14/16 1225 01/15/16 0545 01/16/16 0500  Weight: 92.035 kg (202 lb 14.4 oz) 89.132 kg (196 lb 8 oz) 87.363 kg (192 lb 9.6 oz)    Diuretics held for a few days, restarted on 3/23 due to sign  of pulmonary edema on cxr , downward trending cr  Acute Diastolic HF;  Good urine out put.  Cardiology managing  Holding torsemide due to increase cr. Diuretics IV lasix 40 restarted on 3/23-->lasix 60 po daily 3/29 Net -5.4 since hospital stay   Hyponatremia Appears to have been due to hypovolemia and hyperglycemia - resolved   Coronary artery disease status post CABG x3 2010 Cont ASA + lipid med  Aortic stenosis status post valve replacement 2010 Pericardial tissue valve stable on TTE  Not candidate for valvular repair per cardiology/ID  Carotid artery stenosis  Dopplers May 2015 showed a 60-79% right and 1-39% left stenosis -needs OP reassesment  Hyperlipidemia Cont med tx  GERD  HTN Stable on bisporolol, cardizem, lasix Hypokalemia: replace k       Consultants: [place] Cards   Neurology ID  Procedures: 3/9 TTE - EF 60-65% - Aortic valve: A bioprosthesis was present and functioning normally - Mitral valve: severe stenosis. Mean gradient (D): 11 mm Hg - Right ventricle:  size was normal. Wall thickness normal. Systolic function was normal - Tricuspid valve: trivial regurgitation.  TEE 3/22 cancelled due to hypoxia, TEE 3/28  Antibiotics: levaquin 3/7 > 3/15 vanc on 3/19 and 20 Zosyn 3/19-3/21 ampicillin from 3/21, need total of 6 weeks abx per ID   Discharge Exam: Filed Vitals:   01/16/16 2228 01/17/16 0652  BP: 111/66 122/56  Pulse: 71 70  Temp: 98.4 F (36.9 C) 98.4 F (36.9 C)  Resp: 20 22   No issues  General: eomi ncat Cardiovascular:  s1 s 2 no m/r/g Respiratory: clear, no issues  Discharge Instructions   Discharge Instructions    Ambulatory referral to Neurology    Complete by:  As directed   Pt will follow up with Dr. Leonie Man at [facility] in about 2 months. Thanks.     Diet - low sodium heart healthy    Complete by:  As directed      Increase activity slowly    Complete by:  As directed           Current Discharge Medication List     START taking these medications   Details  Amino Acids-Protein Hydrolys (FEEDING SUPPLEMENT, PRO-STAT SUGAR FREE 64,) LIQD Take 30 mLs by mouth 2 (two) times daily with a meal. Qty: 900 mL, Refills: 0    ampicillin 2 g in sodium chloride 0.9 % 50 mL Inject 2 g into the vein every 6 (six) hours.    aspirin 81 MG chewable tablet Chew 4 tablets (324 mg total) by mouth daily. Qty: 30 tablet, Refills: 0    atorvastatin (LIPITOR) 20 MG tablet Take 1 tablet (20 mg total) by mouth  daily at 6 PM. Qty: 30 tablet, Refills: 0    bisoprolol (ZEBETA) 5 MG tablet Take 0.5 tablets (2.5 mg total) by mouth daily. Qty: 30 tablet, Refills: 0    diltiazem (CARDIZEM) 120 MG tablet Take 1 tablet (120 mg total) by mouth every 8 (eight) hours. Qty: 90 tablet, Refills: 0    feeding supplement, GLUCERNA SHAKE, (GLUCERNA SHAKE) LIQD Take 237 mLs by mouth 3 (three) times daily between meals. Qty: 30 Can, Refills: 0    furosemide (LASIX) 20 MG tablet Take 3 tablets (60 mg total) by mouth daily. Qty: 30 tablet, Refills: 0    insulin aspart (NOVOLOG) 100 UNIT/ML injection Inject 4 Units into the skin 3 (three) times daily with meals. Qty: 10 mL, Refills: 11    predniSONE (DELTASONE) 10 MG tablet Take 1 tablet (10 mg total) by mouth daily with breakfast. Qty: 5 tablet, Refills: 0    simethicone (MYLICON) 40 AC/1.6SA drops Take 0.6 mLs (40 mg total) by mouth 4 (four) times daily as needed for flatulence. Qty: 30 mL, Refills: 0      CONTINUE these medications which have CHANGED   Details  insulin glargine (LANTUS) 100 UNIT/ML injection Inject 0.3 mLs (30 Units total) into the skin daily. Qty: 10 mL, Refills: 11      CONTINUE these medications which have NOT CHANGED   Details  famotidine (PEPCID) 20 MG tablet TAKE 1 TABLET DAILY Qty: 90 tablet, Refills: 3    nitroGLYCERIN (NITROSTAT) 0.4 MG SL tablet Place 0.4 mg under the tongue every 5 (five) minutes as needed.        STOP taking these medications      acetaminophen (TYLENOL) 500 MG tablet      aspirin 81 MG tablet      GLIPIZIDE XL 5 MG 24 hr tablet      metoprolol (LOPRESSOR) 50 MG tablet      ramipril (ALTACE) 5 MG capsule      simvastatin (ZOCOR) 40 MG tablet      Syringe/Needle, Disp, 30G X 1/2" 1 ML MISC        No Known Allergies Follow-up Information    Follow up with Kirk Ruths, MD In 2 weeks.   Specialty:  Cardiology   Why:  afib/chf/cad   Contact information:   [place] [address] [phone]       Follow up with SETHI,PRAMOD, MD. Schedule an appointment as soon as possible for a visit in 2 months.   Specialties:  Neurology, Radiology   Why:  stroke clinic   Contact information:   [address] [place] [address] [phone]        The results of significant diagnostics from this hospitalization (including imaging, microbiology, ancillary and laboratory) are listed below for reference.    Significant Diagnostic Studies:   Microbiology: Recent Results (from the past 240 hour(s))  Culture, blood (routine x 2)     Status: None   Collection Time: 01/08/16  3:27 PM  Result Value Ref Range Status   Specimen Description BLOOD LEFT ANTECUBITAL  Final   Special Requests BOTTLES DRAWN AEROBIC AND ANAEROBIC 5CC EACH  Final   Culture NO GROWTH 5 DAYS  Final   Report Status 01/13/2016 FINAL  Final  Culture, blood (routine x 2)     Status: None   Collection Time: 01/08/16  3:33 PM  Result Value Ref Range Status   Specimen Description BLOOD RIGHT HAND  Final   Special  Requests BOTTLES DRAWN AEROBIC ONLY 5CC  Final   Culture NO GROWTH 5 DAYS  Final   Report Status 01/13/2016 FINAL  Final     Labs: Basic Metabolic Panel:  Recent Labs Lab 01/13/16 0402 01/14/16 0559 01/15/16 0222 01/16/16 0221 01/17/16 0502  NA 137 137 137 138 136  K 3.5 4.0 3.5 3.3* 3.8  CL 100* 101 102 100* 100*  CO2 29 26 27 30 29   GLUCOSE 200* 189* 158* 82 165*  BUN 38*  32* 35* 31* 30*  CREATININE 2.20* 2.04* 2.08* 2.07* 2.03*  CALCIUM 8.0* 8.0* 7.9* 8.1* 8.5*   Liver Function Tests: No results for input(s): AST, ALT, ALKPHOS, BILITOT, PROT, ALBUMIN in the last 168 hours. No results for input(s): LIPASE, AMYLASE in the last 168 hours. No results for input(s): AMMONIA in the last 168 hours. CBC:  Recent Labs Lab 01/15/16 0222 01/16/16 0221 01/17/16 0502  WBC 3.7* 4.2 4.4  NEUTROABS  --   --  3.8  HGB 8.9* 9.7* 10.2*  HCT 28.0* 30.4* 31.6*  MCV 91.2 91.8 91.1  PLT 146* 170 177   Cardiac Enzymes: No results for input(s): CKTOTAL, CKMB, CKMBINDEX, TROPONINI in the last 168 hours. BNP: BNP (last 3 results) No results for input(s): BNP in the last 8760 hours.  ProBNP (last 3 results) No results for input(s): PROBNP in the last 8760 hours.  CBG:  Recent Labs Lab 01/16/16 0621 01/16/16 1145 01/16/16 1650 01/16/16 2232 01/17/16 0603  GLUCAP 80 217* 187* 210* 151*       Signed:  Nita Sells MD   [facility] 01/17/2016, 11:24 AM

## 2016-01-18 ENCOUNTER — Telehealth: Payer: Self-pay | Admitting: *Deleted

## 2016-01-18 NOTE — Telephone Encounter (Signed)
Transitional care call attempted.  Spoke to patient's daughter per DPR, Eric [name].  [name] was discharged from [facility] on 3/30 to rehab at [facility].  Length of stay is unknown at this time.  Per Eric [name], he is still very weak and unable to care for himself at this time.  He is expected to be on IV abx x6 weeks to treat endocarditis.  Will follow up once discharged to home.

## 2016-01-22 ENCOUNTER — Non-Acute Institutional Stay (SKILLED_NURSING_FACILITY): Payer: Medicare Other | Admitting: Internal Medicine

## 2016-01-22 ENCOUNTER — Encounter (HOSPITAL_COMMUNITY): Payer: Self-pay | Admitting: Family Medicine

## 2016-01-22 ENCOUNTER — Encounter: Payer: Self-pay | Admitting: Internal Medicine

## 2016-01-22 DIAGNOSIS — I480 Paroxysmal atrial fibrillation: Secondary | ICD-10-CM | POA: Diagnosis not present

## 2016-01-22 DIAGNOSIS — G9340 Encephalopathy, unspecified: Secondary | ICD-10-CM

## 2016-01-22 DIAGNOSIS — J441 Chronic obstructive pulmonary disease with (acute) exacerbation: Secondary | ICD-10-CM

## 2016-01-22 DIAGNOSIS — N183 Chronic kidney disease, stage 3 unspecified: Secondary | ICD-10-CM

## 2016-01-22 DIAGNOSIS — I509 Heart failure, unspecified: Secondary | ICD-10-CM

## 2016-01-22 DIAGNOSIS — E118 Type 2 diabetes mellitus with unspecified complications: Secondary | ICD-10-CM

## 2016-01-22 DIAGNOSIS — K219 Gastro-esophageal reflux disease without esophagitis: Secondary | ICD-10-CM | POA: Diagnosis not present

## 2016-01-22 DIAGNOSIS — I679 Cerebrovascular disease, unspecified: Secondary | ICD-10-CM

## 2016-01-22 DIAGNOSIS — E1165 Type 2 diabetes mellitus with hyperglycemia: Secondary | ICD-10-CM

## 2016-01-22 DIAGNOSIS — R1310 Dysphagia, unspecified: Secondary | ICD-10-CM

## 2016-01-22 DIAGNOSIS — I339 Acute and subacute endocarditis, unspecified: Secondary | ICD-10-CM

## 2016-01-22 DIAGNOSIS — E46 Unspecified protein-calorie malnutrition: Secondary | ICD-10-CM

## 2016-01-22 DIAGNOSIS — R5381 Other malaise: Secondary | ICD-10-CM | POA: Diagnosis not present

## 2016-01-22 DIAGNOSIS — D638 Anemia in other chronic diseases classified elsewhere: Secondary | ICD-10-CM

## 2016-01-22 DIAGNOSIS — R42 Dizziness and giddiness: Secondary | ICD-10-CM

## 2016-01-22 LAB — CBC AND DIFFERENTIAL
HCT: 29 % — AB (ref 41–53)
Hemoglobin: 9.7 g/dL — AB (ref 13.5–17.5)
Platelets: 1 10*3/uL — AB (ref 150–399)
WBC: 5.4 10^3/mL

## 2016-01-22 LAB — HEPATIC FUNCTION PANEL
ALT: 13 U/L (ref 10–40)
AST: 12 U/L — AB (ref 14–40)
Alkaline Phosphatase: 52 U/L (ref 25–125)
Bilirubin, Total: 1.1 mg/dL

## 2016-01-22 LAB — BASIC METABOLIC PANEL
BUN: 31 mg/dL — AB (ref 4–21)
CREATININE: 1.9 mg/dL — AB (ref 0.6–1.3)
GLUCOSE: 225 mg/dL
POTASSIUM: 3.4 mmol/L (ref 3.4–5.3)
Sodium: 137 mmol/L (ref 137–147)

## 2016-01-22 NOTE — Progress Notes (Signed)
LOCATION: [facility]  PCP: [name], MD   Code Status: DNR  Goals of care: Advanced Directive information Advanced Directives 01/15/2016  Does patient have an advance directive? Yes  Type of Paramedic of [place];Living will  Does patient want to make changes to advanced directive? -  Copy of advanced directive(s) in chart? No - copy requested       Extended Emergency Contact Information Primary Emergency Contact: Manon Hilding Address: [address]          [place], [address] [address] Johnnette Litter of [place] Phone: [phone] Mobile Phone: [phone] Relation: Daughter   No Known Allergies  Chief Complaint  Patient presents with  . Readmit To SNF    Readmission     HPI:  Patient is a 82 y.o. male seen today for short term rehabilitation post hospital admission from 12/25/15-01/17/16 with acute bacterial endocarditis from streptococcus agalacticae, afib with RVR, acute respiratory failure from copd and chf exacerbation and acute stroke. He is seen in his room today. He has slurred speech and is not participating in HPI and ROS. He appears lethargic. Per nursing staff, has low BP reading. He complaints of being weak and dizzy. He denies chest pain. Unable to obtain further ROS.   Review of Systems: Unable to obtain    Past Medical History  Diagnosis Date  . Cerebrovascular disease, unspecified   . Coronary atherosclerosis of unspecified type of vessel, native or graft     s/p CABG 2010  . Esophageal reflux   . Pure hypercholesterolemia   . Essential hypertension, benign   . Type II or unspecified type diabetes mellitus with neurological manifestations, not stated as uncontrolled   . Bilateral hydrocele 3/11    [facility]  . History of aortic stenosis     s/p valve replacment 2010  . Cervicalgia   . Foraminal stenosis of cervical region     bilateral-C4-C5, C5-C6  . Colon polyp   . Shingles 07/27/2012  .  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 Cataract extraction  1990's    bilateral  . Cholecystectomy  1995  . Colonoscopy  1997    Mult. polyps- [facility]  . Sphincterectomy  09/20/03    for jaundice  . Ptca  12/99    with stent  . Coronary artery bypass graft  3/10    x3-using a left internal mammary artery to the left anterior descending coronary artery, saphenous vein graft to circumflex marginal branch, spahenous vein graft  to posterior descendingcoronary artery. Endoscopic saphenous vein harvest from bilateral thighs was done.   . Aortic valve replacement  12/2008    with pericardial tissue valve  . Carotid US  10/2009    B ICA stenosis, stable disease, rec rpt 2 years  . Hydrocele excision      bilateral ([facility])  . L leg trauma  1957    truck over left leg  . Total hip arthroplasty Left 06/30/2014    Procedure: LEFT TOTAL HIP ARTHROPLASTY ANTERIOR APPROACH;  Surgeon: Mcarthur Rossetti, MD;  Location: [facility];  Service: Orthopedics;  Laterality: Left;  . Tee without cardioversion N/A 01/09/2016    Procedure: TRANSESOPHAGEAL ECHOCARDIOGRAM (TEE);  Surgeon: Dorothy Spark, MD;  Location: [facility];  Service: Cardiovascular;  Laterality: N/A;  . Tee without cardioversion N/A 01/15/2016    Procedure: TRANSESOPHAGEAL ECHOCARDIOGRAM (TEE);  Surgeon: Larey Dresser, MD;  Location: [facility];  Service: Cardiovascular;  Laterality: N/A;   Social History:   reports that he quit smoking about 22 years ago. His smoking use included Cigarettes. He has a 50 pack-year smoking history. He has never used smokeless tobacco. He reports that he drinks alcohol. He reports that he does not use illicit drugs.  Family History  Problem Relation Age of Onset  . Heart attack Father 1  . Breast cancer Mother 59  . Brain cancer Sister   . Prostate cancer Brother   .  Diabetes Neg Hx   . Stroke Neg Hx     Medications:   Medication List       This list is accurate as of: 01/22/16  4:40 PM.  Always use your most recent med list.               ampicillin 2 g in sodium chloride 0.9 % 50 mL  Inject 2 g into the vein every 6 (six) hours.     aspirin 81 MG chewable tablet  Chew 4 tablets (324 mg total) by mouth daily.     atorvastatin 20 MG tablet  Commonly known as:  LIPITOR  Take 1 tablet (20 mg total) by mouth daily at 6 PM.     bisoprolol 5 MG tablet  Commonly known as:  ZEBETA  Take 0.5 tablets (2.5 mg total) by mouth daily.     diltiazem 120 MG tablet  Commonly known as:  CARDIZEM  Take 1 tablet (120 mg total) by mouth every 8 (eight) hours.     famotidine 20 MG tablet  Commonly known as:  PEPCID  TAKE 1 TABLET DAILY     feeding supplement (PRO-STAT SUGAR FREE 64) Liqd  Take 30 mLs by mouth 2 (two) times daily with a meal.     furosemide 20 MG tablet  Commonly known as:  LASIX  Take 3 tablets (60 mg total) by mouth daily.     insulin aspart 100 UNIT/ML injection  Commonly known as:  novoLOG  Inject 4 Units into the skin 3 (three) times daily with meals.     insulin glargine 100 UNIT/ML injection  Commonly known as:  LANTUS  Inject 0.3 mLs (30 Units total) into the skin daily.     nitroGLYCERIN 0.4 MG SL tablet  Commonly known as:  NITROSTAT  Place 0.4 mg under the tongue every 5 (five) minutes as needed.     OXYGEN  Inhale 3 L into the lungs.     simethicone 40 MG/0.6ML drops  Commonly known as:  MYLICON  Take 0.6 mLs (40 mg total) by mouth 4 (four) times daily as needed for flatulence.     UNABLE TO FIND  Med Name: Heparin 5 mL IV flush        Immunizations: Immunization History  Administered Date(s) Administered  . PPD Test 01/17/2016  . Pneumococcal Conjugate-13 03/29/2015  . Pneumococcal Polysaccharide-23 11/21/2000  . Td 11/21/2000     Physical Exam: Filed Vitals:   01/22/16 1630  BP: 100/66    Pulse: 90  Temp: 98 F (36.7 C)  TempSrc: Oral  Resp: 13  Height: 5\' 9"  (1.753 m)  Weight: 193 lb (87.544 kg)  SpO2: 95%   Body mass index is 28.49 kg/(m^2).  General- elderly male, in no acute distress Head- normocephalic, atraumatic Nose- no nasal discharge Throat- dry mucus membrane Eyes- PERRLA, EOMI, no pallor, no icterus Neck- no cervical lymphadenopathy Cardiovascular- normal  s1,s2, no murmur, no leg edema Respiratory- bilateral poor air entry and has wheeze, no rhonchi, no crackles, no use of accessory muscles Abdomen- bowel sounds present, soft, non tender, catheter present and has dark colored urine in the bag Musculoskeletal- able to move all 4 extremities, generalized weakness Neurological- slurred speech, has encephalopathy Skin- warm and dry, bruise to left arm, right chest wall PICC line + Psychiatry- normal mood and affect    Labs reviewed: Basic Metabolic Panel:  Recent Labs  12/30/15 0503 12/31/15 0236  01/10/16 0300  01/15/16 0222 01/16/16 0221 01/17/16 01/17/16 0502  NA 143 145  < > 139  < > 137 138 136* 136  K 3.8 3.4*  < > 4.2  < > 3.5 3.3*  --  3.8  CL 113* 115*  < > 100*  < > 102 100*  --  100*  CO2 21* 22  < > 29  < > 27 30  --  29  GLUCOSE 158* 154*  < > 381*  < > 158* 82  --  165*  BUN 39* 26*  < > 36*  < > 35* 31* 30* 30*  CREATININE 1.92* 1.72*  < > 1.98*  < > 2.08* 2.07* 2.0* 2.03*  CALCIUM 8.5* 8.3*  < > 8.0*  < > 7.9* 8.1*  --  8.5*  MG 1.8 1.7  --  1.7  --   --   --   --   --   < > = values in this interval not displayed. Liver Function Tests:  Recent Labs  12/30/15 0503 12/31/15 0236 01/06/16 1736  AST 17 16 22   ALT 28 23 19   ALKPHOS 43 39 48  BILITOT 0.7 0.8 1.2  PROT 5.5* 5.3* 5.6*  ALBUMIN 2.6* 2.4* 2.4*   No results for input(s): LIPASE, AMYLASE in the last 8760 hours.  Recent Labs  01/06/16 1736  AMMONIA 18   CBC:  Recent Labs  12/30/15 0503 12/31/15 0236  01/15/16 0222 01/16/16 0221 01/17/16  01/17/16 0502  WBC 13.0* 9.8  < > 3.7* 4.2 4.4 4.4  NEUTROABS 12.3* 9.1*  --   --   --   --  3.8  HGB 9.2* 9.1*  < > 8.9* 9.7*  --  10.2*  HCT 28.8* 29.3*  < > 28.0* 30.4*  --  31.6*  MCV 92.9 93.3  < > 91.2 91.8  --  91.1  PLT 228 212  < > 146* 170  --  177  < > = values in this interval not displayed. Cardiac Enzymes:  Recent Labs  12/25/15 1744 12/25/15 2234 12/26/15 0252  TROPONINI 0.11* 0.36* 0.11*   BNP: Invalid input(s): POCBNP CBG:  Recent Labs  01/17/16 0603 01/17/16 1120 01/17/16 1628  GLUCAP 151* 187* 156*    Radiological Exams:  3/9 TTE - EF 60-65% - Aortic valve: A bioprosthesis was present and functioning normally - Mitral valve: severe stenosis. Mean gradient (D): 11 mm Hg - Right ventricle:  size was normal. Wall thickness normal. Systolic function was normal - Tricuspid valve: trivial regurgitation.   Assessment/Plan  Physical deconditioning Will have him work with physical therapy and occupational therapy team to help with gait training and muscle strengthening exercises.fall precautions. Skin care. Encourage to be out of bed.   Infective endocarditis Currently on a,picillin 2 g iv q6h until 02/18/16. Given his temperature spike, will send stat 2 set blood culture. Also get stat cbc with diff, cmp, u/a with c/s and CXR. Continue  aspirin 325 mg daily for now. F/u with ID and cardiology  afib Rate controlled. With his soft BP and complaint of dizziness, hold bisoprolol for now. Continue cardizem 120 mg tid. Monitor HR. Has f/u with cardiology. Plan is to initiate eliquis if blood culture is negative  dizziness Complaining of dizziness. Low bp. Stop bisoprolol. Check bp and HR q shift x 1 week and then bid x 1 week and then daily.   Acute encephalopathy With his deconditioning and acute illness. Get blood culture and blood work along with CXR as above to rule out infectious etiology  DM Lab Results  Component Value Date   HGBA1C 7.1* 12/25/2015    Monitor cbg, continue lantus 30 u and SSI    Acute CVA Continue aspirin and statin for now. F/u with neurology  CHF Monitor weight, continue lasix 60 mg daily. Holding bisoprolol for now as above. Monitor BP  COPD  Recent exacerbation. To finish his prednisone today. Continue bronchodilator treatment  ckd stage 3 Monitor bmp  Protein calorie malnutrition Monitor po intake. With his confused mental state, will have assistance to be provided with feeding. Continue feeding supplement. Get SLP consult  Dysphagia Get SLP to evaluate and treat, aspiration precautions  Anemia of chronic disease Monitor cbc  HLD Continue atorvastatin  GERD Continue pecid 20 mg daily   Goals of care: short term rehabilitation. Follow up with cardiology, neurology and ID   Labs/tests ordered: cbc, cmp, u/a with c/s and cxr stat   Family/ staff Communication: reviewed care plan with nursing supervisor    Blanchie Serve, MD Internal Medicine [place], [place] [address] Cell Phone (Monday-Friday 8 am - 5 pm): [phone] On Call: [phone] and follow prompts after 5 pm and on weekends Office Phone: [phone] Office Fax: [phone]

## 2016-01-23 ENCOUNTER — Ambulatory Visit (INDEPENDENT_AMBULATORY_CARE_PROVIDER_SITE_OTHER): Payer: Medicare Other | Admitting: Physician Assistant

## 2016-01-23 ENCOUNTER — Encounter: Payer: Self-pay | Admitting: Physician Assistant

## 2016-01-23 VITALS — BP 90/64 | HR 91 | Ht 69.0 in

## 2016-01-23 DIAGNOSIS — I4891 Unspecified atrial fibrillation: Secondary | ICD-10-CM

## 2016-01-23 DIAGNOSIS — I2510 Atherosclerotic heart disease of native coronary artery without angina pectoris: Secondary | ICD-10-CM

## 2016-01-23 DIAGNOSIS — I480 Paroxysmal atrial fibrillation: Secondary | ICD-10-CM

## 2016-01-23 DIAGNOSIS — I339 Acute and subacute endocarditis, unspecified: Secondary | ICD-10-CM

## 2016-01-23 DIAGNOSIS — I38 Endocarditis, valve unspecified: Secondary | ICD-10-CM | POA: Diagnosis not present

## 2016-01-23 DIAGNOSIS — E7800 Pure hypercholesterolemia, unspecified: Secondary | ICD-10-CM

## 2016-01-23 DIAGNOSIS — I10 Essential (primary) hypertension: Secondary | ICD-10-CM | POA: Diagnosis not present

## 2016-01-23 DIAGNOSIS — I959 Hypotension, unspecified: Secondary | ICD-10-CM

## 2016-01-23 DIAGNOSIS — I6330 Cerebral infarction due to thrombosis of unspecified cerebral artery: Secondary | ICD-10-CM

## 2016-01-23 NOTE — Progress Notes (Signed)
Patient ID: Eric [name], male   DOB: 1927-07-28, 82 y.o.   MRN: [id number]    Date:  01/23/2016   ID:  Eric [name], DOB [birth year], MRN [id number]  PCP:  [name], MD  Primary Cardiologist:  Stanford Breed  Chief Complaint  Patient presents with  . Follow-up    no chest, no swelling, some cramping occ. some dizziness or lightheadedness     History of Present Illness: Eric [name] is a 82 y.o. male w/ PMH of CAD (s/p CABG 2010), aortic stenosis (s/p AVR 2010), Type 2 DM, HTN, and HLD admitted on 12/25/2015 for worsening cough and wheezing. Met SIRS criteria, diagnosed with PNA, positive for strep bacteremia.  He was diagnosed with acute endocarditis.  We were consulted for new-onset atrial fibrillation w/ RVR. CHADSVASC score 7.  Pt had CVA 01/06/16. TEE 01/15/16 with MV vegetation. His heart rate was controlled on Cardizem 120 daily and bisoprolol 2.5 daily.  He is not a surgical candidate for MVR.  He had a TEE on 01/15/2016 revealing a 2 cm vegetation attached to the posterior mitral valve leaflet was moderate mitral valve regurgitation. No thrombus in the atrial cavity or appendage had a normal ejection fraction. In no vegetation on the bioprosthetic aortic valve.  Patient is currently at [facility].  His daughter was with him today says that they worked him pretty hard with physical therapy today.  He reports feeling a little dizzy every once in a while but nothing sustained. He denies any palpitations, chest pain shortness of breath beyond his baseline, orthopnea, lower extremity edema as well as nausea, vomiting, fever, PND, cough, congestion, abdominal pain, hematochezia, melena.  His goal is to get to the point where he can feed bathing toilet himself so that he can return home.    Wt Readings from Last 3 Encounters:  01/22/16 193 lb (87.544 kg)  01/17/16 193 lb (87.544 kg)  04/24/15 216 lb (97.977 kg)     Past Medical History  Diagnosis Date  .  Cerebrovascular disease, unspecified   . Coronary atherosclerosis of unspecified type of vessel, native or graft     s/p CABG 2010  . Esophageal reflux   . Pure hypercholesterolemia   . Essential hypertension, benign   . Type II or unspecified type diabetes mellitus with neurological manifestations, not stated as uncontrolled   . Bilateral hydrocele 3/11    [facility]  . History of aortic stenosis     s/p valve replacment 2010  . Cervicalgia   . Foraminal stenosis of cervical region     bilateral-C4-C5, C5-C6  . Colon polyp   . Shingles 07/27/2012  . 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 Amino Acids-Protein Hydrolys (FEEDING SUPPLEMENT, PRO-STAT SUGAR FREE 64,) LIQD Take 30 mLs by mouth 2 (two) times daily with a meal. 900 mL 0  . ampicillin 2 g in sodium chloride 0.9 % 50 mL Inject 2 g into the vein every 6 (six) hours.    Marland Kitchen aspirin 81 MG chewable tablet Chew 4 tablets (324 mg total) by mouth daily. 30 tablet 0  . atorvastatin (LIPITOR) 20 MG tablet Take 1 tablet (20 mg total) by mouth daily at 6 PM. 30 tablet 0  . bisoprolol (ZEBETA) 5 MG tablet Take 0.5 tablets (2.5 mg total) by mouth daily. 30 tablet 0  . diltiazem (CARDIZEM) 120  MG tablet Take 1 tablet (120 mg total) by mouth every 8 (eight) hours. 90 tablet 0  . famotidine (PEPCID) 20 MG tablet TAKE 1 TABLET DAILY 90 tablet 3  . furosemide (LASIX) 20 MG tablet Take 3 tablets (60 mg total) by mouth daily. 30 tablet 0  . insulin aspart (NOVOLOG) 100 UNIT/ML injection Inject 4 Units into the skin 3 (three) times daily with meals. 10 mL 11  . insulin glargine (LANTUS) 100 UNIT/ML injection Inject 0.3 mLs (30 Units total) into the skin daily. 10 mL 11  . nitroGLYCERIN (NITROSTAT) 0.4 MG SL tablet Place 0.4 mg under the tongue every 5 (five) minutes as needed.      . OXYGEN Inhale 3 L into the lungs.      Marland Kitchen UNABLE TO FIND Med Name: Heparin 5 mL IV flush     No current facility-administered medications for this visit.    Allergies:   No Known Allergies  Social History:  The patient  reports that he quit smoking about 22 years ago. His smoking use included Cigarettes. He has a 50 pack-year smoking history. He has never used smokeless tobacco. He reports that he drinks alcohol. He reports that he does not use illicit drugs.   Family history:   Family History  Problem Relation Age of Onset  . Heart attack Father 46  . Breast cancer Mother 63  . Brain cancer Sister   . Prostate cancer Brother   . Diabetes Neg Hx   . Stroke Neg Hx     ROS:  Please see the history of present illness.  All other systems reviewed and negative.   PHYSICAL EXAM: VS:  BP 90/64 mmHg  Pulse 91  Ht _0  (1.753 m) Well nourished, well developed, in no acute distress, he is in a wheelchair on supplemental oxygen. HEENT: Pupils are equal round react to light accommodation extraocular movements are intact.  Neck: no JVDNo cervical lymphadenopathy. Cardiac: Regular rate and rhythm without murmurs rubs or gallops. Lungs:  clear to auscultation bilaterally, no wheezing, rhonchi or rales Abd: soft, nontender, positive bowel sounds all quadrants, no hepatosplenomegaly Ext: no lower extremity edema.  2+ radial and dorsalis pedis pulses. Skin: warm and dry Neuro:  Grossly normal  EKG:  Atrial fibrillation with a rate of 91 bpm   ASSESSMENT AND PLAN:  Problem List Items Addressed This Visit    PAF (paroxysmal atrial fibrillation) ([name]   Hypotension   HYPERTENSION, BENIGN SYSTEMIC - Primary   Relevant Orders   EKG 12-Lead   HYPERCHOLESTEROLEMIA   CAD in native artery   Atrial fibrillation with RVR ([name]   Relevant Orders   EKG 12-Lead   Acute endocarditis    Other Visit Diagnoses    Endocarditis        Relevant Orders    Echocardiogram       According to discharge summary the patient will be  continued on ampicillin through May 1. We'll schedule a follow-up echocardiogram proximally May 7.  Dr. Erlinda Hong with neurology recommended continuing aspirin for another week after discharge and then repeating blood cultures.  If negative, he recommended switching aspirin to eliquis 2.5 g twice daily. Continues in atrial fibrillation his rate is controlled on Cardizem 120 mg in bisoprolol 2.5 mg daily. Patient is mildly hypotensive and does not appear to be overly symptomatic.  Continue Lipitor for hyperlipidemia.  We'll make sure blood cultures were drawn at [facility].

## 2016-01-23 NOTE — Patient Instructions (Addendum)
Medication Instructions:  Your physician recommends that you continue on your current medications as directed. Please refer to the Current Medication list given to you today.   Labwork: None ordered  Testing/Procedures: Your physician has requested that you have an echocardiogram. Echocardiography is a painless test that uses sound waves to create images of your heart. It provides your doctor with information about the size and shape of your heart and how well your heart's chambers and valves are working. This procedure takes approximately one hour. There are no restrictions for this procedure. ( To be scheduled the 1st week of May 2017)  Follow-Up: Your physician recommends that you schedule a follow-up appointment in mid May 2017 with an APP  Your physician recommends that you schedule a follow-up appointment in: 3 months with Dr.Crenshaw    Any Other Special Instructions Will Be Listed Below (If Applicable).     If you need a refill on your cardiac medications before your next appointment, please call your pharmacy.

## 2016-01-25 ENCOUNTER — Other Ambulatory Visit: Payer: Self-pay | Admitting: *Deleted

## 2016-01-25 DIAGNOSIS — I38 Endocarditis, valve unspecified: Secondary | ICD-10-CM

## 2016-01-28 LAB — BASIC METABOLIC PANEL
BUN: 24 mg/dL — AB (ref 4–21)
Creatinine: 1.9 mg/dL — AB (ref 0.6–1.3)
GLUCOSE: 210 mg/dL
Potassium: 3.7 mmol/L (ref 3.4–5.3)
SODIUM: 140 mmol/L (ref 137–147)

## 2016-01-30 ENCOUNTER — Emergency Department (HOSPITAL_COMMUNITY)
Admission: EM | Admit: 2016-01-30 | Discharge: 2016-01-30 | Disposition: A | Discharge status: Home / Self Care | Payer: Medicare Other | Attending: Emergency Medicine | Admitting: Emergency Medicine

## 2016-01-30 ENCOUNTER — Encounter (HOSPITAL_COMMUNITY): Payer: Self-pay | Admitting: Emergency Medicine

## 2016-01-30 ENCOUNTER — Emergency Department (HOSPITAL_COMMUNITY): Payer: Medicare Other

## 2016-01-30 ENCOUNTER — Telehealth: Payer: Self-pay | Admitting: Family Medicine

## 2016-01-30 DIAGNOSIS — R509 Fever, unspecified: Secondary | ICD-10-CM

## 2016-01-30 DIAGNOSIS — Z87891 Personal history of nicotine dependence: Secondary | ICD-10-CM | POA: Insufficient documentation

## 2016-01-30 DIAGNOSIS — E1149 Type 2 diabetes mellitus with other diabetic neurological complication: Secondary | ICD-10-CM | POA: Insufficient documentation

## 2016-01-30 DIAGNOSIS — R112 Nausea with vomiting, unspecified: Secondary | ICD-10-CM | POA: Diagnosis not present

## 2016-01-30 DIAGNOSIS — K8020 Calculus of gallbladder without cholecystitis without obstruction: Secondary | ICD-10-CM | POA: Diagnosis not present

## 2016-01-30 DIAGNOSIS — I10 Essential (primary) hypertension: Secondary | ICD-10-CM | POA: Insufficient documentation

## 2016-01-30 DIAGNOSIS — Z7982 Long term (current) use of aspirin: Secondary | ICD-10-CM | POA: Insufficient documentation

## 2016-01-30 DIAGNOSIS — Z8719 Personal history of other diseases of the digestive system: Secondary | ICD-10-CM | POA: Insufficient documentation

## 2016-01-30 DIAGNOSIS — E7800 Pure hypercholesterolemia, unspecified: Secondary | ICD-10-CM | POA: Insufficient documentation

## 2016-01-30 DIAGNOSIS — Z87448 Personal history of other diseases of urinary system: Secondary | ICD-10-CM | POA: Insufficient documentation

## 2016-01-30 DIAGNOSIS — I4891 Unspecified atrial fibrillation: Secondary | ICD-10-CM | POA: Diagnosis not present

## 2016-01-30 DIAGNOSIS — I2510 Atherosclerotic heart disease of native coronary artery without angina pectoris: Secondary | ICD-10-CM | POA: Insufficient documentation

## 2016-01-30 DIAGNOSIS — Z86010 Personal history of colonic polyps: Secondary | ICD-10-CM | POA: Insufficient documentation

## 2016-01-30 DIAGNOSIS — D62 Acute posthemorrhagic anemia: Secondary | ICD-10-CM

## 2016-01-30 DIAGNOSIS — R0682 Tachypnea, not elsewhere classified: Secondary | ICD-10-CM | POA: Insufficient documentation

## 2016-01-30 DIAGNOSIS — Z8619 Personal history of other infectious and parasitic diseases: Secondary | ICD-10-CM | POA: Insufficient documentation

## 2016-01-30 DIAGNOSIS — Z79899 Other long term (current) drug therapy: Secondary | ICD-10-CM | POA: Insufficient documentation

## 2016-01-30 DIAGNOSIS — Z794 Long term (current) use of insulin: Secondary | ICD-10-CM | POA: Insufficient documentation

## 2016-01-30 DIAGNOSIS — Z951 Presence of aortocoronary bypass graft: Secondary | ICD-10-CM | POA: Insufficient documentation

## 2016-01-30 LAB — COMPREHENSIVE METABOLIC PANEL
ALK PHOS: 54 U/L (ref 38–126)
ALT: 9 U/L — AB (ref 17–63)
AST: 17 U/L (ref 15–41)
Albumin: 2 g/dL — ABNORMAL LOW (ref 3.5–5.0)
Anion gap: 8 (ref 5–15)
BILIRUBIN TOTAL: 0.8 mg/dL (ref 0.3–1.2)
BUN: 22 mg/dL — ABNORMAL HIGH (ref 6–20)
CALCIUM: 7.7 mg/dL — AB (ref 8.9–10.3)
CHLORIDE: 106 mmol/L (ref 101–111)
CO2: 24 mmol/L (ref 22–32)
CREATININE: 1.64 mg/dL — AB (ref 0.61–1.24)
GFR, EST AFRICAN AMERICAN: 42 mL/min — AB (ref >60)
GFR, EST NON AFRICAN AMERICAN: 36 mL/min — AB (ref >60)
Glucose, Bld: 166 mg/dL — ABNORMAL HIGH (ref 65–99)
Potassium: 3.8 mmol/L (ref 3.5–5.1)
Sodium: 138 mmol/L (ref 135–145)
TOTAL PROTEIN: 5.4 g/dL — AB (ref 6.5–8.1)

## 2016-01-30 LAB — CBC WITH DIFFERENTIAL/PLATELET
Basophils Absolute: 0 10*3/uL (ref 0.0–0.1)
Basophils Relative: 0 %
EOS ABS: 0.2 10*3/uL (ref 0.0–0.7)
EOS PCT: 4 %
HCT: 21.3 % — ABNORMAL LOW (ref 39.0–52.0)
Hemoglobin: 7.2 g/dL — ABNORMAL LOW (ref 13.0–17.0)
LYMPHS ABS: 0.4 10*3/uL — AB (ref 0.7–4.0)
LYMPHS PCT: 6 %
MCH: 31 pg (ref 26.0–34.0)
MCHC: 33.8 g/dL (ref 30.0–36.0)
MCV: 91.8 fL (ref 78.0–100.0)
MONO ABS: 0.4 10*3/uL (ref 0.1–1.0)
Monocytes Relative: 7 %
Neutro Abs: 5 10*3/uL (ref 1.7–7.7)
Neutrophils Relative %: 83 %
PLATELETS: 131 10*3/uL — AB (ref 150–400)
RBC: 2.32 MIL/uL — AB (ref 4.22–5.81)
RDW: 15.3 % (ref 11.5–15.5)
WBC: 6 10*3/uL (ref 4.0–10.5)

## 2016-01-30 LAB — BRAIN NATRIURETIC PEPTIDE: B NATRIURETIC PEPTIDE 5: 193 pg/mL — AB (ref 0.0–100.0)

## 2016-01-30 LAB — I-STAT CG4 LACTIC ACID, ED
LACTIC ACID, VENOUS: 0.6 mmol/L (ref 0.5–2.0)
Lactic Acid, Venous: 1.24 mmol/L (ref 0.5–2.0)

## 2016-01-30 LAB — TROPONIN I: TROPONIN I: 0.03 ng/mL (ref <0.031)

## 2016-01-30 MED ORDER — SODIUM CHLORIDE 0.9 % IV BOLUS (SEPSIS)
1000.0000 mL | INTRAVENOUS | Status: AC
  Administered 2016-01-30: 1000 mL via INTRAVENOUS

## 2016-01-30 MED ORDER — IOPAMIDOL (ISOVUE-300) INJECTION 61%
100.0000 mL | Freq: Once | INTRAVENOUS | Status: AC | PRN
  Administered 2016-01-30: 80 mL via INTRAVENOUS

## 2016-01-30 MED ORDER — DIATRIZOATE MEGLUMINE & SODIUM 66-10 % PO SOLN
30.0000 mL | Freq: Once | ORAL | Status: AC
  Administered 2016-01-30: 30 mL via ORAL

## 2016-01-30 NOTE — ED Notes (Signed)
[facility] called for transport.  

## 2016-01-30 NOTE — Telephone Encounter (Signed)
Reviewed yesterday's ER note - he came in with vomiting coffee ground emesis as well as noted marked drop in hemoglobin - how is he feeling today? His drop in hemoglobin is concerning for ongoing GI bleed. If any more vomiting, or if marked fatigue or any dyspnea or chest pain, he will need to return to ER today. At the very least he needs to come in for stat labwork to repeat readings and if remaining low may need ER evaluation for this.

## 2016-01-30 NOTE — ED Notes (Signed)
EMS was called to [facility] by facility employees for pt having hypertension. EMS arrived and took their own vital signs and it was 104/62 along with tachycardia and tachypnea. Pt is alert and oriented at his baseline level. Per facility, pt had 3 episodes of coffee ground emesis appearing emesis. Pt has no complaints and reports no pain at time. Pt has a slight fever and has a history of endocarditis for which is is recieving Ampicillin vial a picc line every 8 hours. Pt had just finished a dose when EMS arrived

## 2016-01-30 NOTE — Discharge Instructions (Signed)
Endocarditis Endocarditis is an infection of the inner layer of the heart (endocardium) or of the heart valves. Endocarditis can cause growths inside the heart or on the heart valves. These growths can destroy heart tissue and cause heart failure over time. They can also cause stroke if they break away and form a blood clot in the brain. CAUSES  Endocarditis is caused by germs that normally live in or on your body. The germs that most commonly cause endocarditis are bacteria, but fungi can also cause endocarditis. RISK FACTORS Risk factors include:  Having a heart defect.  Having artificial (prosthetic) heart valves.  Having an abnormal or damaged heart valve.  Having a history of endocarditis.  Having had a heart transplant. SIGNS AND SYMPTOMS Signs and symptoms may start suddenly, or they may start slowly and gradually get worse. Symptoms include:  Fever.  Chills.  Night sweats.  Muscle aches.  Fatigue.  Weakness.  Shortness of breath. Signs include:  An abnormal heart sound (murmur).  Retinal bleeding.  Bleeding under the nails of your fingers or toes.  Painless red spots on your palms.  Painful lumps in your fingertips or toes.  Swelling in your feet or ankles. DIAGNOSIS  To make a diagnosis, your health care provider may:  Perform a physical exam. During the exam he or she will listen to your heart to check for a murmur. He or she may also use a scope to check for bleeding in your retinas.  Order tests. They may include:  Blood tests to look for the germs that cause endocarditis.  An echocardiogram to create an image of your heart. TREATMENT Early treatment offers the best chance for curing endocarditis and preventing complications. Treatment depends on the cause of the endocarditis. Treatment may include:  Antibiotic medicines. These may be given through an IV tube or taken orally.  Surgery to replace your heart valve. You may need surgery if:  The  endocarditis does not respond to treatment.  You develop complications.  Your heart valve is severely damaged. HOME CARE INSTRUCTIONS  Take your antibiotic as directed by your health care provider. Finish the antibiotic even if you start to feel better.  Gradually resume your usual activities.  Let your health care provider know before you have any dental or surgical procedures. You may need to take antibiotics before the procedure.  Let all your health care providers, including your dentist, know that you have had endocarditis.  Do not get tattoos or body piercings.  Do not use IV drugs unless it is part of your medical treatment.  Practice good oral hygiene. This includes:  Brushing and flossing regularly.  Scheduling routine dental appointments. SEEK MEDICAL CARE IF:  You have a fever.  Your symptoms do not improve.  Your symptoms get worse.  Your symptoms come back. SEEK IMMEDIATE MEDICAL CARE IF:  You have trouble breathing.  You have chest pain.  You have symptoms of stroke. These include:  Sudden weakness.  Numbness.  Confusion.  Trouble talking.  A severe headache.   This information is not intended to replace advice given to you by your health care provider. Make sure you discuss any questions you have with your health care provider.   Document Released: 10/06/2005 Document Revised: 10/27/2014 Document Reviewed: 05/23/2014 Elsevier Interactive Patient Education 2016 [place]. Fever, Adult A fever is an increase in the body's temperature. It is often defined as a temperature of 100 F (38C) or higher. Short mild or moderate fevers often  have no long-term effects. They also often do not need treatment. Moderate or high fevers may make you feel uncomfortable. Sometimes, they can also be a sign of a serious illness or disease. The sweating that may happen with repeated fevers or fevers that last a while may also cause you to not have enough fluid in  your body (dehydration). You can take your temperature with a thermometer to see if you have a fever. A measured temperature can change with:  Age.  Time of day.  Where the thermometer is placed:  Mouth (oral).  Rectum (rectal).  Ear (tympanic).  Underarm (axillary).  Forehead (temporal). HOME CARE Pay attention to any changes in your symptoms. Take these actions to help with your condition:  Take over-the-counter and prescription medicines only as told by your doctor. Follow the dosing instructions carefully.  If you were prescribed an antibiotic medicine, take it as told by your doctor. Do not stop taking the antibiotic even if you start to feel better.  Rest as needed.  Drink enough fluid to keep your pee (urine) clear or pale yellow.  Sponge yourself or bathe with room-temperature water as needed. This helps to lower your body temperature . Do not use ice water.  Do not wear too many blankets or heavy clothes. GET HELP IF:  You throw up (vomit).  You cannot eat or drink without throwing up.  You have watery poop (diarrhea).  It hurts when you pee.  Your symptoms do not get better with treatment.  You have new symptoms.  You feel very weak. GET HELP RIGHT AWAY IF:  You are short of breath or have trouble breathing.  You are dizzy or you pass out (faint).  You feel confused.  You have signs of not having enough fluid in your body, such as:  A dry mouth.  Peeing less.  Looking pale.  You have very bad pain in your belly (abdomen).  You keep throwing up or having water poop.  You have a skin rash.  Your symptoms suddenly get worse.   This information is not intended to replace advice given to you by your health care provider. Make sure you discuss any questions you have with your health care provider.   Document Released: 07/15/2008 Document Revised: 06/27/2015 Document Reviewed: 11/30/2014 Elsevier Interactive Patient Education [employer].

## 2016-01-30 NOTE — ED Provider Notes (Addendum)
CSN: [id number]     Arrival date & time 01/30/16  0125 History  By signing my name below, I, Helane Gunther, attest that this documentation has been prepared under the direction and in the presence of Orpah Greek, MD. Electronically Signed: Helane Gunther, ED Scribe. 01/30/2016. 1:54 AM.     Chief Complaint  Patient presents with  . Emesis  . Fever   The history is provided by the patient and the EMS personnel. No language interpreter was used.   HPI Comments: Eric [name] is a 82 y.o. male former smoker with a PMHx of vegetative endocarditis of the mitral valve, coronary atherosclerosis, essential HTN, and DM, as well as a PSHx of CABG, aortic valve replacement, and Tee without cardioversion brought by ambulance who presents to the Emergency Department complaining of a slight fever and 3 episodes of emesis emesis onset tonight. Pt denies any pains, SOB, and diarrhea.   Per EMS, they were told pt had 3 episodes of coffee-ground emesis today. They note pt has a PMHx of endocarditis, for which he is being treated with ampicillin via a PICC line every 8 hours. They state pt had just finished a dose of ampicillin when EMS arrived.    Past Medical History  Diagnosis Date  . Cerebrovascular disease, unspecified   . Coronary atherosclerosis of unspecified type of vessel, native or graft     s/p CABG 2010  . Esophageal reflux   . Pure hypercholesterolemia   . Essential hypertension, benign   . Type II or unspecified type diabetes mellitus with neurological manifestations, not stated as uncontrolled   . Bilateral hydrocele 3/11    [facility]  . History of aortic stenosis     s/p valve replacment 2010  . Cervicalgia   . Foraminal stenosis of cervical region     bilateral-C4-C5, C5-C6  . Colon polyp   . Shingles 07/27/2012  . Vegetative endocarditis of mitral valve 12/2015    with moderate mitral regurg s/p hospitalization   Past Surgical History  Procedure Laterality Date   . Cataract extraction  1990's    bilateral  . Cholecystectomy  1995  . Colonoscopy  1997    Mult. polyps- [facility]  . Sphincterectomy  09/20/03    for jaundice  . Ptca  12/99    with stent  . Coronary artery bypass graft  3/10    x3-using a left internal mammary artery to the left anterior descending coronary artery, saphenous vein graft to circumflex marginal branch, spahenous vein graft  to posterior descendingcoronary artery. Endoscopic saphenous vein harvest from bilateral thighs was done.   . Aortic valve replacement  12/2008    with pericardial tissue valve  . Carotid US  10/2009    B ICA stenosis, stable disease, rec rpt 2 years  . Hydrocele excision      bilateral ([facility])  . L leg trauma  1957    truck over left leg  . Total hip arthroplasty Left 06/30/2014    Procedure: LEFT TOTAL HIP ARTHROPLASTY ANTERIOR APPROACH;  Surgeon: Mcarthur Rossetti, MD;  Location: [facility];  Service: Orthopedics;  Laterality: Left;  . Tee without cardioversion N/A 01/09/2016    Procedure: TRANSESOPHAGEAL ECHOCARDIOGRAM (TEE);  Surgeon: Dorothy Spark, MD;  Location: [facility];  Service: Cardiovascular;  Laterality: N/A;  . Tee without cardioversion N/A 01/15/2016    Procedure: TRANSESOPHAGEAL ECHOCARDIOGRAM (TEE);  Surgeon: Larey Dresser, MD;  Location: [facility];  Service: Cardiovascular;  Laterality: N/A;  Family History  Problem Relation Age of Onset  . Heart attack Father 46  . Breast cancer Mother 41  . Brain cancer Sister   . Prostate cancer Brother   . Diabetes Neg Hx   . Stroke Neg Hx    Social History  Substance Use Topics  . Smoking status: Former Smoker -- 1.00 packs/day for 50 years    Types: Cigarettes    Quit date: 10/20/1993  . Smokeless tobacco: Never Used  . Alcohol Use: 0.0 oz/week    0 Standard drinks or equivalent per week     Comment: occasional    Review of Systems  Constitutional: Positive for fever.  Respiratory: Negative for  shortness of breath.   Gastrointestinal: Positive for vomiting. Negative for diarrhea.  Musculoskeletal: Negative for myalgias.  All other systems reviewed and are negative.   Allergies  Review of patient's allergies indicates no known allergies.  Home Medications   Prior to Admission medications   Medication Sig Start Date End Date Taking? Authorizing Provider  Amino Acids-Protein Hydrolys (FEEDING SUPPLEMENT, PRO-STAT SUGAR FREE 64,) LIQD Take 30 mLs by mouth 2 (two) times daily with a meal. 01/17/16  Yes Nita Sells, MD  ampicillin 2 g in sodium chloride 0.9 % 50 mL Inject 2 g into the vein every 6 (six) hours. 01/17/16  Yes Nita Sells, MD  aspirin 81 MG chewable tablet Chew 4 tablets (324 mg total) by mouth daily. 01/17/16  Yes Nita Sells, MD  atorvastatin (LIPITOR) 20 MG tablet Take 1 tablet (20 mg total) by mouth daily at 6 PM. 01/17/16  Yes Nita Sells, MD  diltiazem (CARDIZEM) 120 MG tablet Take 1 tablet (120 mg total) by mouth every 8 (eight) hours. 01/17/16  Yes Nita Sells, MD  famotidine (PEPCID) 20 MG tablet TAKE 1 TABLET DAILY Patient taking differently: Take 20 mg by mouth daily 06/01/15  Yes [name], MD  furosemide (LASIX) 20 MG tablet Take 3 tablets (60 mg total) by mouth daily. 01/17/16  Yes Nita Sells, MD  insulin aspart (NOVOLOG) 100 UNIT/ML injection Inject 4 Units into the skin 3 (three) times daily with meals. 01/17/16  Yes Nita Sells, MD  insulin glargine (LANTUS) 100 UNIT/ML injection Inject 0.3 mLs (30 Units total) into the skin daily. 01/17/16  Yes Nita Sells, MD  nitroGLYCERIN (NITROSTAT) 0.4 MG SL tablet Place 0.4 mg under the tongue every 5 (five) minutes as needed.     Yes Historical Provider, MD  OXYGEN Inhale 3 L into the lungs.   Yes Historical Provider, MD  Potassium Gluconate 2.5 MEQ TABS Take 10 mEq by mouth daily.   Yes Historical Provider, MD  UNABLE TO FIND Med Name: Heparin 5 mL IV  flush   Yes Historical Provider, MD  bisoprolol (ZEBETA) 5 MG tablet Take 0.5 tablets (2.5 mg total) by mouth daily. 01/17/16   Nita Sells, MD   BP 98/57 mmHg  Pulse 112  Temp(Src) 98.2 F (36.8 C) (Oral)  Resp 17  SpO2 97% Physical Exam  Constitutional: He is oriented to person, place, and time. He appears well-developed and well-nourished. No distress.  HENT:  Head: Normocephalic and atraumatic.  Right Ear: Hearing normal.  Left Ear: Hearing normal.  Nose: Nose normal.  Mouth/Throat: Oropharynx is clear and moist and mucous membranes are normal.  Eyes: Conjunctivae and EOM are normal. Pupils are equal, round, and reactive to light.  Neck: Normal range of motion. Neck supple.  Cardiovascular: S1 normal and S2 normal.  A regularly irregular  rhythm present. Tachycardia present.  Exam reveals no gallop and no friction rub.   No murmur heard. Pulmonary/Chest: Effort normal. Tachypnea noted. No respiratory distress. He has rales. He exhibits no tenderness.  Abdominal: Soft. Normal appearance and bowel sounds are normal. There is no hepatosplenomegaly. There is no tenderness. There is no rebound, no guarding, no tenderness at McBurney's point and negative Murphy's sign. No hernia.  Breath sounds diminished, tachypnic, rales at the bases bilaterally  Musculoskeletal: Normal range of motion.  Neurological: He is alert and oriented to person, place, and time. He has normal strength. No cranial nerve deficit or sensory deficit. Coordination normal. GCS eye subscore is 4. GCS verbal subscore is 5. GCS motor subscore is 6.  Skin: Skin is warm, dry and intact. No rash noted. No cyanosis.  Psychiatric: He has a normal mood and affect. His speech is normal and behavior is normal. Thought content normal.  Nursing note and vitals reviewed.   ED Course  Procedures  DIAGNOSTIC STUDIES: Oxygen Saturation is 98% on 3 L/min [place], adequate by my interpretation.    COORDINATION OF CARE: 1:48 AM -  Discussed plans to wait on diagnostic studies. Pt advised of plan for treatment and pt agrees.  Labs Review Labs Reviewed  CBC WITH DIFFERENTIAL/PLATELET - Abnormal; Notable for the following:    RBC 2.32 (*)    Hemoglobin 7.2 (*)    HCT 21.3 (*)    Platelets 131 (*)    Lymphs Abs 0.4 (*)    All other components within normal limits  COMPREHENSIVE METABOLIC PANEL - Abnormal; Notable for the following:    Glucose, Bld 166 (*)    BUN 22 (*)    Creatinine, Ser 1.64 (*)    Calcium 7.7 (*)    Total Protein 5.4 (*)    Albumin 2.0 (*)    ALT 9 (*)    GFR calc non Af Amer 36 (*)    GFR calc Af Amer 42 (*)    All other components within normal limits  BRAIN NATRIURETIC PEPTIDE - Abnormal; Notable for the following:    B Natriuretic Peptide 193.0 (*)    All other components within normal limits  CULTURE, BLOOD (ROUTINE X 2)  CULTURE, BLOOD (ROUTINE X 2)  URINE CULTURE  TROPONIN I  URINALYSIS, ROUTINE W REFLEX MICROSCOPIC (NOT AT [facility])  I-STAT CG4 LACTIC ACID, ED  I-STAT CG4 LACTIC ACID, ED    Imaging Review Dg Chest 2 View  01/30/2016  CLINICAL DATA:  Vomiting and fever for 2 days.  Unresponsive. EXAM: CHEST  2 VIEW COMPARISON:  01/09/2016 FINDINGS: There is a right jugular central line with tip in the SVC near the azygos vein junction. There is prior sternotomy, CABG and aortic valvuloplasty. There are alveolar opacities in the central lung regions bilaterally. There is moderate vascular prominence. No effusions. IMPRESSION: Central alveolar opacities and moderate vascular prominence, suggesting congestive heart failure. Cannot exclude infectious infiltrates. Electronically Signed   By: Andreas Newport M.D.   On: 01/30/2016 03:29   Ct Abdomen Pelvis W Contrast  01/30/2016  CLINICAL DATA:  Fever and abdominal pain. 3 episodes of coffee ground emesis. History of biliary sphincterotomy due to jaundice. EXAM: CT ABDOMEN AND PELVIS WITH CONTRAST TECHNIQUE: Multidetector CT imaging of the  abdomen and pelvis was performed using the standard protocol following bolus administration of intravenous contrast. CONTRAST:  41mL ISOVUE-300 IOPAMIDOL (ISOVUE-300) INJECTION 61% COMPARISON:  Fever and abdominal pain. Three episodes of coffee ground emesis. FINDINGS: Lower chest:  Evaluation of the lower thorax is degraded secondary to patient respiratory artifact. Trace bilateral effusions with associated dependent ground-glass atelectasis. No discrete focal airspace opacities given limitation of the examination. Normal heart size. Exuberant calcifications within the mitral valve annulus. Suspected calcifications within the aortic valve annulus. Hepatobiliary: Normal hepatic contour. There is a minimal amount of centralized pneumobilia within the nondependent left lobe of the liver compatible of prior history of biliary sphincterotomy. Post cholecystectomy. There is mild dilatation of the common bile duct, measuring, presumably the sequela of post cholecystectomy state. There is a punctate (approximately 7 mm) apparent filling defect within the distal aspect of the CBD (image 20, series 2), which may represent choledocholithiasis versus sludge. Pancreas: Normal in appearance.  No peripancreatic stranding. Spleen: Normal in appearance. No evidence splenomegaly or. Splenic stranding. Adrenals/Urinary Tract: There is symmetric enhancement of the bilateral kidneys. The bilateral kidneys appear mildly atrophic. No discrete renal lesions. No renal stones in this postcontrast examination. There is a minimal amount of bilateral perinephric stranding. No urinary obstruction. Stomach/Bowel: Large colonic stool burden. Extensive diverticulosis without evidence of diverticulitis. Normal appearance of the terminal ileum. There is a minimal amount of high-density material within the base of the appendix favored to represent a tiny (approximately 0.4 cm) at appendicolith. No appendiceal thickening or periappendiceal stranding.  No pneumoperitoneum, pneumatosis or portal venous gas. Vascular/Lymphatic: Large amount of irregular mixed calcified and noncalcified atherosclerotic plaque within a tortuous and mildly aneurysmal abdominal aorta which measures approximately 3.2 cm in greatest diameter (image 44, series 2). Potential hemodynamically significant narrowing involving the left common iliac artery (image 53, series 2 and left common femoral arteries (image 81, series 2). No bulky retroperitoneal, mesenteric, pelvic or inguinal lymphadenopathy. Reproductive: Normal in appearance, though evaluation degraded secondary streak artifact from patient's left total hip prosthesis. Normal appearance of the urinary bladder given degree distention. No free fluid in the pelvic cul-de-sac. Other: Regional soft tissues appear normal. Musculoskeletal: Post left total hip replacements without evidence of hardware failure or loosening. IMPRESSION: 1. Punctate (approximately 7 mm) apparent filling defect in the distal aspect of the CBD may represent choledocholithiasis versus biliary sludge. Correlation with LFTs is recommended. Further evaluation with MRCP or ERCP could be performed as clinically indicated. 2. Extensive colonic diverticulosis without evidence of diverticulitis. 3. Punctate (approximately 4 mm) at appendicolith within otherwise normal-appearing appendix. 4. Large amount of irregular mixed calcified and noncalcified atherosclerotic plaque within a mildly aneurysmal abdominal aorta measuring 3.2 cm in diameter. Recommend followup by ultrasound in 3 years. This recommendation follows ACR consensus guidelines: White Paper of the ACR Incidental Findings Committee II on Vascular Findings. Natasha Mead Coll Radiol 2013; [phone] This exam was interpreted during a PACS downtime with limited availability of comparison cases. It has been flagged for review following the downtime. If clinically indicated after this review, an addendum will be issued  providing details about comparison to prior imaging. Electronically Signed   By: Sandi Mariscal M.D.   On: 01/30/2016 07:48   I have personally reviewed and evaluated these images and lab results as part of my medical decision-making.   EKG Interpretation   Date/Time:  Wednesday January 30 2016 01:31:17 EDT Ventricular Rate:  108 PR Interval:    QRS Duration: 106 QT Interval:  337 QTC Calculation: 452 R Axis:   -46 Text Interpretation:  Atrial fibrillation LAD, consider left anterior  fascicular block Left ventricular hypertrophy Confirmed by POLLINA  MD,  CHRISTOPHER [id number]) on 01/30/2016 7:19:12 AM  MDM   Final diagnoses:  Fever, unspecified fever cause  Nausea and vomiting, vomiting of unspecified type  Atrial fibrillation with RVR ([name]  Fever Vomiting  Patient sent to the emergency department for evaluation of fever. Patient also had an episode of emesis prior to arrival. He denies abdominal pain. Abdominal exam was benign. Lab work was unremarkable. He did have a temperature of 100.5 at arrival to the ER. Reviewing his records reveals that he is currently being treated with ampicillin for endocarditis. He also has a central line in place. There is no surrounding erythema or drainage. This could be a source for fever. Blood culture was drawn through the central line as well as peripherally. Chest x-ray does not show any evidence of pneumonia. CT abdomen and pelvis was performed and does not show acute abnormality. There is a punctate filling defect in the common bile duct, but patient is not expressing any tenderness. LFTs are unremarkable. Clinically does not appear to require ERCP or MRCP at this time.  Patient is in atrial fibrillation. This has been persistent since his last hospitalization. Reviewing his cardiology notes reveals that he also has been hypotensive for them and they are tolerating that. This is because of the treatment for his atrial fibrillation. Pressures have  been ranging in the 90s to the low 100s. This is where he is today. Heart rate at times jumps up into the 100s, but he has been persistently in the 80s and 90s as well. He is also due for his morning medications.  Discussed with Dr. Linus Salmons, who is following him from infectious disease. Specifically discussed treatment. He does not recommend changing any of the antibiotic treatment at this time. He does not feel the patient requires hospitalization, but rather will watch the cultures and if anything is positive, patient back for admission. Will obtain a second set of cultures prior to discharge from the emergency department.  I personally performed the services described in this documentation, which was scribed in my presence. The recorded information has been reviewed and is accurate.    Orpah Greek, MD 01/30/16 [place], MD 01/30/16 [phone]

## 2016-01-30 NOTE — ED Notes (Signed)
Infectious disease called for Dr Betsey Holiday

## 2016-01-31 LAB — CBC AND DIFFERENTIAL
HEMATOCRIT: 26 % — AB (ref 41–53)
Hemoglobin: 8.3 g/dL — AB (ref 13.5–17.5)
PLATELETS: 161 10*3/uL (ref 150–399)
WBC: 4.2 10*3/mL

## 2016-01-31 NOTE — Telephone Encounter (Signed)
Message left for daughter to return my call.

## 2016-01-31 NOTE — Telephone Encounter (Signed)
Spoke with patient's daughter and she said that patient does seem not as feisty, extremely tired and more winded than normal. She said the ER told her that he was vomiting the red jello he ate prior to his visit there and they never addressed or mentioned his Hgb to her at all. She was going to see him today and would take him back to ER for further eval after I expressed your concerns.

## 2016-01-31 NOTE — Telephone Encounter (Signed)
Patient's daughter,Lori,returned Kim's call.  Patient is at [facility] and he's not doing well.  Cecille Rubin can be reached at [phone].

## 2016-01-31 NOTE — Telephone Encounter (Signed)
Message left advising patient and asked to return my call.

## 2016-02-04 ENCOUNTER — Telehealth: Payer: Self-pay | Admitting: Family Medicine

## 2016-02-04 LAB — CULTURE, BLOOD (ROUTINE X 2)
Culture: NO GROWTH
Culture: NO GROWTH
Culture: NO GROWTH
Culture: NO GROWTH

## 2016-02-04 NOTE — Telephone Encounter (Signed)
Left message for Laurie  to call back.

## 2016-02-04 NOTE — Telephone Encounter (Signed)
Pt not seen at ER over weekend. plz call for update (touch base with daughter) and rec he at least come in for labs to recheck Hgb.

## 2016-02-04 NOTE — Telephone Encounter (Signed)
Margarita Grizzle called to let you know Pt is still at [facility] place His hemoglobin is 8.3 as of yesterday They are wanting to keep him there.  She wanted to know if dr g could call [facility] dr  Discuss pt care.

## 2016-02-04 NOTE — Telephone Encounter (Signed)
Spoke to daughter. She said they are checking his stool. She was told he was supposed to start iron him.

## 2016-02-04 NOTE — Telephone Encounter (Signed)
Patient's daughter,Laurie,returned Shannon's call. Please call her back at [phone].

## 2016-02-04 NOTE — Telephone Encounter (Signed)
Eric [name] said he feels awful and is grouchy. He did not go to the ER because [facility] told them his HGB was 8.3 What should she do?

## 2016-02-04 NOTE — Telephone Encounter (Signed)
Actually reassuring that Hgb increased some on recheck (I assume [facility] rechecked CBC after he returned from ER), but still lower than during hospitalization. Would have them watch for worsening abd pain, blood in stool or urine, or any more nausea/vomiting and have him evaluated if any of that happens.

## 2016-02-04 NOTE — Telephone Encounter (Signed)
See previous message

## 2016-02-04 NOTE — Telephone Encounter (Signed)
Margarita Grizzle called to let you know Pt is still at [facility] place His hemoglobin is 8.3 as of yesterday They are wanting to keep him there. She wanted to know if dr g could call [facility] dr Discuss pt care.

## 2016-02-05 ENCOUNTER — Encounter: Payer: Self-pay | Admitting: Family

## 2016-02-05 ENCOUNTER — Non-Acute Institutional Stay (SKILLED_NURSING_FACILITY): Payer: Medicare Other | Admitting: Family

## 2016-02-05 DIAGNOSIS — D509 Iron deficiency anemia, unspecified: Secondary | ICD-10-CM

## 2016-02-05 NOTE — Progress Notes (Signed)
Location:  [place] Room Number: 1203 Place of Service:  SNF [phone]) Provider:  Marlowe Sax, FNP-C Cher Nakai MD  [name], MD  Patient Care Team: [name], MD as PCP - General  Extended Emergency Contact Information Primary Emergency Contact: Manon Hilding Address: [address]          [place], [place] [address] Johnnette Litter of [place] Phone: [phone] Mobile Phone: [phone] Relation: Daughter  Code Status:  DNR Goals of care: Advanced Directive information Advanced Directives 02/05/2016  Does patient have an advance directive? Yes  Type of Advance Directive Out of facility DNR (pink MOST or yellow form)  Does patient want to make changes to advanced directive? No - Patient declined  Copy of advanced directive(s) in chart? Yes     Chief Complaint  Patient presents with  . Acute Visit    Acute Concerns    HPI:  Pt is a 82 y.o. male seen today at [facility] for an acute visit for follow up anemia. He has a medical history of HTN, afib, Type 2 DM, GERD, s/p CABG, dysphagia,Endocarditis among others. He is seen in his room today. He denies any acute issues. Patient's daughter states was notified by patient's primary provider that lab results showed Hgb 7.7 would like lab rechecked. Previous Hgb 9.7 (01/22/2016).      No Known Allergies    Medication List       This list is accurate as of: 02/05/16  1:58 PM.  Always use your most recent med list.               ampicillin 2 g in sodium chloride 0.9 % 50 mL  Inject 2 g into the vein every 6 (six) hours.     aspirin 81 MG chewable tablet  Chew 81 mg by mouth daily.     atorvastatin 20 MG tablet  Commonly known as:  LIPITOR  Take 1 tablet (20 mg total) by mouth daily at 6 PM.     diltiazem 120 MG tablet  Commonly known as:  CARDIZEM  Take 1 by mouth every 8 hours. Hold for HR < 60/min, SBP <110     famotidine 20 MG tablet    Commonly known as:  PEPCID  TAKE 1 TABLET DAILY     feeding supplement (PRO-STAT SUGAR FREE 64) Liqd  Take 30 mLs by mouth 2 (two) times daily with a meal.     furosemide 20 MG tablet  Commonly known as:  LASIX  3 by mouth daily, Hold for SBP < 110     insulin aspart 100 UNIT/ML injection  Commonly known as:  novoLOG  Inject 4 Units into the skin 3 (three) times daily with meals.     insulin glargine 100 UNIT/ML injection  Commonly known as:  LANTUS  Inject 0.3 mLs (30 Units total) into the skin daily.     nitroGLYCERIN 0.4 MG SL tablet  Commonly known as:  NITROSTAT  Place 0.4 mg under the tongue every 5 (five) minutes as needed.     OXYGEN  Inhale 3 L into the lungs.     potassium chloride 10 MEQ CR capsule  Commonly known as:  MICRO-K  Take 10 mEq by mouth daily.     simethicone 40 MG/0.6ML drops  Commonly known as:  MYLICON  Take 40 mg by mouth 4 (four) times daily as needed for flatulence.     UNABLE TO FIND  Med Name: Heparin 5 mL IV flush        Review of Systems  Constitutional: Negative for fever, chills, activity change, appetite change and fatigue.  HENT: Negative for congestion, rhinorrhea, sinus pressure, sneezing and sore throat.   Eyes: Negative.   Respiratory: Negative for cough, chest tightness and shortness of breath.   Cardiovascular: Negative for chest pain, palpitations and leg swelling.  Gastrointestinal: Negative for nausea, vomiting, abdominal pain, diarrhea, constipation and abdominal distention.  Musculoskeletal: Positive for gait problem.  Skin: Negative.   Neurological: Negative for dizziness, syncope, light-headedness and headaches.  Psychiatric/Behavioral: Negative for confusion and agitation.    Immunization History  Administered Date(s) Administered  . PPD Test 01/17/2016, 01/31/2016  . Pneumococcal Conjugate-13 03/29/2015  . Pneumococcal Polysaccharide-23 11/21/2000  . Td 11/21/2000   Pertinent  Health Maintenance Due   Topic Date Due  . OPHTHALMOLOGY EXAM  11/27/1936  . URINE MICROALBUMIN  03/04/2016  . FOOT EXAM  03/28/2016  . INFLUENZA VACCINE  05/20/2016  . HEMOGLOBIN A1C  06/26/2016  . PNA vac Low Risk Adult  Completed   Fall Risk  03/29/2015 12/01/2011  Falls in the past year? No -  Risk for fall due to : - History of fall(s)  Risk for fall due to (comments): - one fall in last year, did not injure himself     Filed Vitals:   02/05/16 1323  BP: 109/81  Pulse: 80  Temp: 98.6 F (37 C)  Resp: 20  Height: 5\' 9"  (1.753 m)  Weight: 185 lb (83.915 kg)   Body mass index is 27.31 kg/(m^2). Physical Exam  Constitutional: He appears well-developed and well-nourished. No distress.  HENT:  Head: Normocephalic.  Mouth/Throat: Oropharynx is clear and moist.  Eyes: Conjunctivae and EOM are normal. Pupils are equal, round, and reactive to light.  Neck: Normal range of motion. No JVD present.  Cardiovascular: Normal rate and intact distal pulses.  Exam reveals no gallop and no friction rub.   No murmur heard. Irregular   Pulmonary/Chest: Effort normal and breath sounds normal. No respiratory distress. He has no wheezes. He has no rales.  Abdominal: Soft. Bowel sounds are normal. He exhibits no distension. There is no tenderness. There is no rebound and no guarding.  Musculoskeletal: Normal range of motion. He exhibits no edema or tenderness.  Bilat. LE's weakness   Lymphadenopathy:    He has no cervical adenopathy.  Neurological: He is alert.  Skin: Skin is warm and dry. No rash noted. No erythema. No pallor.  Psychiatric: He has a normal mood and affect.    Labs reviewed:  Recent Labs  12/30/15 0503 12/31/15 0236  01/10/16 0300  01/16/16 0221  01/17/16 0502 01/22/16 01/28/16 01/30/16 0147  NA 143 145  < > 139  < > 138  < > 136 137 140 138  K 3.8 3.4*  < > 4.2  < > 3.3*  --  3.8 3.4 3.7 3.8  CL 113* 115*  < > 100*  < > 100*  --  100*  --   --  106  CO2 21* 22  < > 29  < > 30  --  29   --   --  24  GLUCOSE 158* 154*  < > 381*  < > 82  --  165*  --   --  166*  BUN 39* 26*  < > 36*  < > 31*  < > 30* 31* 24* 22*  CREATININE 1.92* 1.72*  < >  1.98*  < > 2.07*  < > 2.03* 1.9* 1.9* 1.64*  CALCIUM 8.5* 8.3*  < > 8.0*  < > 8.1*  --  8.5*  --   --  7.7*  MG 1.8 1.7  --  1.7  --   --   --   --   --   --   --   < > = values in this interval not displayed.  Recent Labs  12/31/15 0236 01/06/16 1736 01/22/16 01/30/16 0147  AST 16 22 12* 17  ALT 23 19 13  9*  ALKPHOS 39 48 52 54  BILITOT 0.8 1.2  --  0.8  PROT 5.3* 5.6*  --  5.4*  ALBUMIN 2.4* 2.4*  --  2.0*    Lab Results  Component Value Date   TSH 0.643 12/28/2015   Lab Results  Component Value Date   HGBA1C 7.1* 12/25/2015   Lab Results  Component Value Date   CHOL 107 01/07/2016   HDL 33* 01/07/2016   LDLCALC 52 01/07/2016   LDLDIRECT 51.0 03/05/2015   TRIG 112 01/07/2016   CHOLHDL 3.2 01/07/2016      Assessment/Plan  Anemia  Negative exam findings. Start Ferrous sulfate 325 mg Tablet one by mouth once daily. [place] stool X 3 .CBC 02/07/2016  Family/ staff Communication: Reviewed plan with patient and facility Nurse supervisor.   Labs/tests ordered:  CBC 02/07/2016

## 2016-02-07 LAB — CBC AND DIFFERENTIAL
HCT: 26 % — AB (ref 41–53)
Hemoglobin: 8.3 g/dL — AB (ref 13.5–17.5)
PLATELETS: 492 10*3/uL — AB (ref 150–399)
WBC: 5.9 10*3/mL

## 2016-02-13 ENCOUNTER — Non-Acute Institutional Stay (SKILLED_NURSING_FACILITY): Payer: Medicare Other | Admitting: Family

## 2016-02-13 ENCOUNTER — Encounter: Payer: Self-pay | Admitting: Family

## 2016-02-13 DIAGNOSIS — L538 Other specified erythematous conditions: Secondary | ICD-10-CM | POA: Diagnosis not present

## 2016-02-13 NOTE — Progress Notes (Deleted)
Location:  [place] Room Number: 1203 Place of Service:  SNF [phone]) Provider:  Marlowe Sax, NP  [name], MD  Patient Care Team: [name], MD as PCP - General  Extended Emergency Contact Information Primary Emergency Contact: Manon Hilding Address: [address]          [place], [place] [address] Johnnette Litter of [place] Phone: [phone] Mobile Phone: [phone] Relation: Daughter  Code Status:  *** Goals of care: Advanced Directive information Advanced Directives 02/13/2016  Does patient have an advance directive? Yes  Type of Advance Directive Out of facility DNR (pink MOST or yellow form)  Does patient want to make changes to advanced directive? No - Patient declined  Copy of advanced directive(s) in chart? Yes     Chief Complaint  Patient presents with  . Acute Visit    Follow up on Rash    HPI:  Pt is a 82 y.o. male seen today for an acute visit for    Past Medical History  Diagnosis Date  . Cerebrovascular disease, unspecified   . Coronary atherosclerosis of unspecified type of vessel, native or graft     s/p CABG 2010  . Esophageal reflux   . Pure hypercholesterolemia   . Essential hypertension, benign   . Type II or unspecified type diabetes mellitus with neurological manifestations, not stated as uncontrolled   . Bilateral hydrocele 3/11    [facility]  . History of aortic stenosis     s/p valve replacment 2010  . Cervicalgia   . Foraminal stenosis of cervical region     bilateral-C4-C5, C5-C6  . Colon polyp   . Shingles 07/27/2012  . 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 Cataract extraction  1990's    bilateral  . Cholecystectomy  1995  . Colonoscopy  1997    Mult. polyps- [facility]  . Sphincterectomy  09/20/03    for jaundice  . Ptca  12/99    with stent  . Coronary  artery bypass graft  3/10    x3-using a left internal mammary artery to the left anterior descending coronary artery, saphenous vein graft to circumflex marginal branch, spahenous vein graft  to posterior descendingcoronary artery. Endoscopic saphenous vein harvest from bilateral thighs was done.   . Aortic valve replacement  12/2008    with pericardial tissue valve  . Carotid US  10/2009    B ICA stenosis, stable disease, rec rpt 2 years  . Hydrocele excision      bilateral ([facility])  . L leg trauma  1957    truck over left leg  . Total hip arthroplasty Left 06/30/2014    Procedure: LEFT TOTAL HIP ARTHROPLASTY ANTERIOR APPROACH;  Surgeon: Mcarthur Rossetti, MD;  Location: [facility];  Service: Orthopedics;  Laterality: Left;  . Tee without cardioversion N/A 01/09/2016    Procedure: TRANSESOPHAGEAL ECHOCARDIOGRAM (TEE);  Surgeon: Dorothy Spark, MD;  Location: [facility];  Service: Cardiovascular;  Laterality: N/A;  . Tee without cardioversion N/A 01/15/2016    Procedure: TRANSESOPHAGEAL ECHOCARDIOGRAM (TEE);  Surgeon: Larey Dresser, MD;  Location: [facility];  Service: Cardiovascular;  Laterality: N/A;    No Known Allergies    Medication List       This list is accurate as of: 02/13/16 10:33 AM.  Always use your most recent med list.  ampicillin 2 g in sodium chloride 0.9 % 50 mL  Inject 2 g into the vein every 6 (six) hours.     aspirin 81 MG chewable tablet  Chew 81 mg by mouth daily.     atorvastatin 20 MG tablet  Commonly known as:  LIPITOR  Take 1 tablet (20 mg total) by mouth daily at 6 PM.     BISOPROLOL FUMARATE PO  Take 1/2 tablet  = 2.5 mg tablet by mouth daily for HTN/Afib     diltiazem 120 MG tablet  Commonly known as:  CARDIZEM  Take 1 by mouth every 8 hours. Hold for HR < 60/min, SBP <110     feeding supplement (PRO-STAT SUGAR FREE 64) Liqd  Take 30 mLs by mouth 2 (two) times daily with a meal.     ferrous sulfate 325 (65  FE) MG tablet  Take 325 mg by mouth daily.     furosemide 20 MG tablet  Commonly known as:  LASIX  3 by mouth daily, Hold for SBP < 110     insulin aspart 100 UNIT/ML injection  Commonly known as:  novoLOG  Inject 4 Units into the skin 3 (three) times daily with meals.     insulin glargine 100 UNIT/ML injection  Commonly known as:  LANTUS  Inject 0.3 mLs (30 Units total) into the skin daily.     nitroGLYCERIN 0.4 MG SL tablet  Commonly known as:  NITROSTAT  Place 0.4 mg under the tongue every 5 (five) minutes as needed.     OXYGEN  Inhale 3 L into the lungs.     potassium chloride 10 MEQ CR capsule  Commonly known as:  MICRO-K  Take 10 mEq by mouth daily.     simethicone 40 MG/0.6ML drops  Commonly known as:  MYLICON  Take 40 mg by mouth 4 (four) times daily as needed for flatulence.     UNABLE TO FIND  Med Name: Heparin 5 mL IV flush        Review of Systems  Immunization History  Administered Date(s) Administered  . PPD Test 01/17/2016, 01/31/2016  . Pneumococcal Conjugate-13 03/29/2015  . Pneumococcal Polysaccharide-23 11/21/2000  . Td 11/21/2000   Pertinent  Health Maintenance Due  Topic Date Due  . OPHTHALMOLOGY EXAM  11/27/1936  . URINE MICROALBUMIN  03/04/2016  . FOOT EXAM  03/28/2016  . INFLUENZA VACCINE  05/20/2016  . HEMOGLOBIN A1C  06/26/2016  . PNA vac Low Risk Adult  Completed   Fall Risk  03/29/2015 12/01/2011  Falls in the past year? No -  Risk for fall due to : - History of fall(s)  Risk for fall due to (comments): - one fall in last year, did not injure himself   Functional Status Survey:    There were no vitals filed for this visit. There is no weight on file to calculate BMI. Physical Exam

## 2016-02-14 LAB — CBC AND DIFFERENTIAL
HCT: 32 % — AB (ref 41–53)
Hemoglobin: 9.9 g/dL — AB (ref 13.5–17.5)
PLATELETS: 495 10*3/uL — AB (ref 150–399)
WBC: 13.7 10^3/mL

## 2016-02-14 LAB — BASIC METABOLIC PANEL
BUN: 26 mg/dL — AB (ref 4–21)
CREATININE: 1.8 mg/dL — AB (ref 0.6–1.3)
Glucose: 97 mg/dL
Potassium: 3.6 mmol/L (ref 3.4–5.3)
Sodium: 138 mmol/L (ref 137–147)

## 2016-02-15 ENCOUNTER — Non-Acute Institutional Stay (SKILLED_NURSING_FACILITY): Payer: Medicare Other | Admitting: Internal Medicine

## 2016-02-15 ENCOUNTER — Encounter: Payer: Self-pay | Admitting: Internal Medicine

## 2016-02-15 ENCOUNTER — Telehealth: Payer: Self-pay | Admitting: Internal Medicine

## 2016-02-15 DIAGNOSIS — D473 Essential (hemorrhagic) thrombocythemia: Secondary | ICD-10-CM | POA: Diagnosis not present

## 2016-02-15 DIAGNOSIS — D721 Eosinophilia, unspecified: Secondary | ICD-10-CM

## 2016-02-15 DIAGNOSIS — I339 Acute and subacute endocarditis, unspecified: Secondary | ICD-10-CM

## 2016-02-15 DIAGNOSIS — D75839 Thrombocytosis, unspecified: Secondary | ICD-10-CM

## 2016-02-15 DIAGNOSIS — D72829 Elevated white blood cell count, unspecified: Secondary | ICD-10-CM | POA: Diagnosis not present

## 2016-02-15 DIAGNOSIS — L538 Other specified erythematous conditions: Secondary | ICD-10-CM | POA: Diagnosis not present

## 2016-02-15 NOTE — Telephone Encounter (Signed)
Patient developed rash on 4/26 while on ampicillin. He has received benadryl to help with symptoms. MD called for recs. Plan to change to vancomycin til end of therapy. Can continue with prn benadryl. If rash is significant, can do short burst of steroids 40mg  x 3d, 30mg  x 3d, 20mg  x 3 d. Patient to be seen in clinic next week for evalluation

## 2016-02-15 NOTE — Progress Notes (Signed)
LOCATION: [place]  PCP: [name], MD   Code Status: DNR  Goals of care: Advanced Directive information Advanced Directives 02/13/2016  Does patient have an advance directive? Yes  Type of Advance Directive Out of facility DNR (pink MOST or yellow form)  Does patient want to make changes to advanced directive? No - Patient declined  Copy of advanced directive(s) in chart? Yes       Extended Emergency Contact Information Primary Emergency Contact: Manon Hilding Address: [address]          [place], [place] [address] Johnnette Litter of [place] Phone: [phone] Mobile Phone: [phone] Relation: Daughter   No Known Allergies  Chief Complaint  Patient presents with  . Acute Visit    abnormal labs    HPI:  Patient is a 82 y.o. male seen today for acute concern. He had blood work done and he has leukocytosis and thrombocytosis. On further review he also has eosinophilia. Patient is seen in his room today. per nursing staff, he has extensive rash to his arms and trunk area. He complaints of itching. 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     Review of Systems:  Constitutional: Negative for fever, chills and diaphoresis.  HENT: Negative for congestion and sore throat.   Eyes: Negative for blurred vision, double vision and discharge.  Respiratory: Negative for cough, shortness of breath and wheezing.   Cardiovascular: Negative for chest pain, palpitations Gastrointestinal: Negative for heartburn, nausea, vomiting, abdominal pain Musculoskeletal: Negative for fall  Skin: positive for itching and rash.  Neurological: Negative for dizziness. Positive for weakness. Psychiatric/Behavioral: Negative for depression     Past Medical History  Diagnosis Date  . Cerebrovascular disease, unspecified   . Coronary atherosclerosis of unspecified type of vessel, native or graft     s/p CABG 2010    . Esophageal reflux   . Pure hypercholesterolemia   . Essential hypertension, benign   . Type II or unspecified type diabetes mellitus with neurological manifestations, not stated as uncontrolled   . Bilateral hydrocele 3/11    [facility]  . History of aortic stenosis     s/p valve replacment 2010  . Cervicalgia   . Foraminal stenosis of cervical region     bilateral-C4-C5, C5-C6  . Colon polyp   . Shingles 07/27/2012  . 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 Cataract extraction  1990's    bilateral  . Cholecystectomy  1995  . Colonoscopy  1997    Mult. polyps- [facility]  . Sphincterectomy  09/20/03    for jaundice  . Ptca  12/99    with stent  . Coronary artery bypass graft  3/10    x3-using a left internal mammary artery to the left anterior descending coronary artery, saphenous vein graft to circumflex marginal branch, spahenous vein graft  to posterior descendingcoronary artery. Endoscopic saphenous vein harvest from bilateral thighs was done.   . Aortic valve replacement  12/2008    with pericardial tissue valve  . Carotid US  10/2009    B ICA stenosis, stable disease, rec rpt 2 years  . Hydrocele excision      bilateral ([facility])  . L leg trauma  1957    truck over left leg  . Total hip arthroplasty Left 06/30/2014    Procedure: LEFT TOTAL HIP ARTHROPLASTY  ANTERIOR APPROACH;  Surgeon: Mcarthur Rossetti, MD;  Location: [facility];  Service: Orthopedics;  Laterality: Left;  . Tee without cardioversion N/A 01/09/2016    Procedure: TRANSESOPHAGEAL ECHOCARDIOGRAM (TEE);  Surgeon: Dorothy Spark, MD;  Location: [facility];  Service: Cardiovascular;  Laterality: N/A;  . Tee without cardioversion N/A 01/15/2016    Procedure: TRANSESOPHAGEAL ECHOCARDIOGRAM (TEE);  Surgeon: Larey Dresser, MD;  Location: [facility];  Service: Cardiovascular;   Laterality: N/A;    Medications:   Medication List       This list is accurate as of: 02/15/16  1:03 PM.  Always use your most recent med list.               ampicillin 2 g in sodium chloride 0.9 % 50 mL  Inject 2 g into the vein every 6 (six) hours.     aspirin 81 MG chewable tablet  Chew 81 mg by mouth daily.     atorvastatin 20 MG tablet  Commonly known as:  LIPITOR  Take 1 tablet (20 mg total) by mouth daily at 6 PM.     diltiazem 120 MG tablet  Commonly known as:  CARDIZEM  Take 120 mg by mouth 3 (three) times daily.     diphenhydrAMINE 25 MG tablet  Commonly known as:  BENADRYL  Take 25 mg by mouth every 6 (six) hours as needed for itching.     famotidine 20 MG tablet  Commonly known as:  PEPCID  Take 20 mg by mouth daily.     feeding supplement (PRO-STAT SUGAR FREE 64) Liqd  Take 30 mLs by mouth 2 (two) times daily with a meal.     ferrous sulfate 325 (65 FE) MG tablet  Take 325 mg by mouth daily.     furosemide 20 MG tablet  Commonly known as:  LASIX  3 by mouth daily, Hold for SBP < 110     insulin aspart 100 UNIT/ML injection  Commonly known as:  novoLOG  Inject 4 Units into the skin 3 (three) times daily with meals.     insulin glargine 100 UNIT/ML injection  Commonly known as:  LANTUS  Inject 0.3 mLs (30 Units total) into the skin daily.     nitroGLYCERIN 0.4 MG SL tablet  Commonly known as:  NITROSTAT  Place 0.4 mg under the tongue every 5 (five) minutes as needed.     OXYGEN  Inhale 3 L into the lungs.     potassium chloride 10 MEQ CR capsule  Commonly known as:  MICRO-K  Take 10 mEq by mouth daily.     simethicone 40 MG/0.6ML drops  Commonly known as:  MYLICON  Take 40 mg by mouth 4 (four) times daily as needed for flatulence.     UNABLE TO FIND  Med Name: Heparin 5 mL IV flush        Immunizations: Immunization History  Administered Date(s) Administered  . PPD Test 01/17/2016, 01/31/2016  . Pneumococcal Conjugate-13  03/29/2015  . Pneumococcal Polysaccharide-23 11/21/2000  . Td 11/21/2000     Physical Exam: Filed Vitals:   02/15/16 1250  BP: 106/65  Pulse: 103  Temp: 98.5 F (36.9 C)  TempSrc: Oral  Resp: 20  Height: 5\' 9"  (1.753 m)  Weight: 179 lb 6.4 oz (81.375 kg)  SpO2: 96%   Body mass index is 26.48 kg/(m^2).  General- elderly male, in no acute distress Head- normocephalic, atraumatic Nose- no nasal discharge Throat- dry mucus membrane Eyes- PERRLA, EOMI,  no pallor, no icterus Neck- no cervical lymphadenopathy Cardiovascular- normal s1,s2, no murmur, no leg edema Respiratory- CTAB, no use of accessory muscles Abdomen- bowel sounds present, soft, non tender Musculoskeletal- able to move all 4 extremities, generalized weakness Neurological- alert and oriented Skin- warm and dry, right chest wall PICC line + and site clean and dry, has generalized macular erythematous rash, no vesicles or bullae noted, no mucosal involvement Psychiatry- normal mood and affect   Labs reviewed: Basic Metabolic Panel:  Recent Labs  12/30/15 0503 12/31/15 0236  01/10/16 0300  01/16/16 0221  01/17/16 0502  01/28/16 01/30/16 0147 02/14/16  NA 143 145  < > 139  < > 138  < > 136  < > 140 138 138  K 3.8 3.4*  < > 4.2  < > 3.3*  --  3.8  < > 3.7 3.8 3.6  CL 113* 115*  < > 100*  < > 100*  --  100*  --   --  106  --   CO2 21* 22  < > 29  < > 30  --  29  --   --  24  --   GLUCOSE 158* 154*  < > 381*  < > 82  --  165*  --   --  166*  --   BUN 39* 26*  < > 36*  < > 31*  < > 30*  < > 24* 22* 26*  CREATININE 1.92* 1.72*  < > 1.98*  < > 2.07*  < > 2.03*  < > 1.9* 1.64* 1.8*  CALCIUM 8.5* 8.3*  < > 8.0*  < > 8.1*  --  8.5*  --   --  7.7*  --   MG 1.8 1.7  --  1.7  --   --   --   --   --   --   --   --   < > = values in this interval not displayed. Liver Function Tests:  Recent Labs  12/31/15 0236 01/06/16 1736 01/22/16 01/30/16 0147  AST 16 22 12* 17  ALT 23 19 13  9*  ALKPHOS 39 48 52 54  BILITOT  0.8 1.2  --  0.8  PROT 5.3* 5.6*  --  5.4*  ALBUMIN 2.4* 2.4*  --  2.0*   No results for input(s): LIPASE, AMYLASE in the last 8760 hours.  Recent Labs  01/06/16 1736  AMMONIA 18   CBC:  Recent Labs  12/31/15 0236  01/16/16 0221  01/17/16 0502  01/30/16 0149 01/31/16 02/07/16 02/14/16  WBC 9.8  < > 4.2  < > 4.4  < > 6.0 4.2 5.9 13.7  NEUTROABS 9.1*  --   --   --  3.8  --  5.0  --   --   --   HGB 9.1*  < > 9.7*  --  10.2*  < > 7.2* 8.3* 8.3* 9.9*  HCT 29.3*  < > 30.4*  --  31.6*  < > 21.3* 26* 26* 32*  MCV 93.3  < > 91.8  --  91.1  --  91.8  --   --   --   PLT 212  < > 170  --  177  < > 131* 161 492* 495*  < > = values in this interval not displayed.    Assessment/Plan  Macular rash Likely has allergic reaction to his antibiotics. Will stop ampicillin for now. Start prednisone 40 mg po daily x 3 days and  then taper by 10 mg q 3 days and stop. Will have him on benadryl 25 mg q6h prn for itching and monitor. Monitor for worsening of rash and mucosal involvement  Infective endocarditis Currently on ampicillin 2 g iv q6h until 02/18/16. Spoke with on call ID Dr Baxter Flattery and her input is appreciated. Will d/c ampicillin due to rash. Start vancomycin 1 g daily for now and will have him be seen by ID clinic on 02/18/16. Monitor for fever.   Eosinophilia Likely from allergic reaction. Starting him on prednisone and will monitor cbc  Thrombocytosis He is currently being treated for infective endocarditis. Likely has reactive thrombocytosis  Leukocytosis With his endocarditis, monitor wbc and temp curve    Family/ staff Communication: reviewed care plan with patient and nursing supervisor    Blanchie Serve, MD Internal Medicine [address] [place],  [address] Cell Phone (Monday-Friday 8 am - 5 pm): [phone] On Call: [phone] and follow prompts after 5 pm and on weekends Office Phone: [phone] Office Fax:  [phone]

## 2016-02-16 LAB — BASIC METABOLIC PANEL
BUN: 24 mg/dL — AB (ref 4–21)
Creatinine: 1.8 mg/dL — AB (ref 0.6–1.3)

## 2016-02-18 DIAGNOSIS — I071 Rheumatic tricuspid insufficiency: Secondary | ICD-10-CM | POA: Diagnosis not present

## 2016-02-18 DIAGNOSIS — I420 Dilated cardiomyopathy: Secondary | ICD-10-CM | POA: Diagnosis not present

## 2016-02-18 DIAGNOSIS — I052 Rheumatic mitral stenosis with insufficiency: Secondary | ICD-10-CM | POA: Diagnosis not present

## 2016-02-18 DIAGNOSIS — K219 Gastro-esophageal reflux disease without esophagitis: Secondary | ICD-10-CM | POA: Diagnosis not present

## 2016-02-18 DIAGNOSIS — D509 Iron deficiency anemia, unspecified: Secondary | ICD-10-CM | POA: Diagnosis not present

## 2016-02-18 DIAGNOSIS — I119 Hypertensive heart disease without heart failure: Secondary | ICD-10-CM | POA: Diagnosis not present

## 2016-02-18 DIAGNOSIS — I38 Endocarditis, valve unspecified: Secondary | ICD-10-CM | POA: Diagnosis present

## 2016-02-18 DIAGNOSIS — N183 Chronic kidney disease, stage 3 (moderate): Secondary | ICD-10-CM | POA: Diagnosis not present

## 2016-02-18 DIAGNOSIS — I2510 Atherosclerotic heart disease of native coronary artery without angina pectoris: Secondary | ICD-10-CM | POA: Diagnosis not present

## 2016-02-18 DIAGNOSIS — I330 Acute and subacute infective endocarditis: Secondary | ICD-10-CM | POA: Diagnosis not present

## 2016-02-18 DIAGNOSIS — I272 Other secondary pulmonary hypertension: Secondary | ICD-10-CM | POA: Diagnosis not present

## 2016-02-18 DIAGNOSIS — I339 Acute and subacute endocarditis, unspecified: Secondary | ICD-10-CM | POA: Diagnosis not present

## 2016-02-18 DIAGNOSIS — M6281 Muscle weakness (generalized): Secondary | ICD-10-CM | POA: Diagnosis not present

## 2016-02-18 DIAGNOSIS — D72829 Elevated white blood cell count, unspecified: Secondary | ICD-10-CM | POA: Diagnosis not present

## 2016-02-18 DIAGNOSIS — Z9181 History of falling: Secondary | ICD-10-CM | POA: Diagnosis not present

## 2016-02-18 DIAGNOSIS — I4891 Unspecified atrial fibrillation: Secondary | ICD-10-CM | POA: Diagnosis not present

## 2016-02-18 DIAGNOSIS — I10 Essential (primary) hypertension: Secondary | ICD-10-CM | POA: Diagnosis not present

## 2016-02-18 DIAGNOSIS — Z87891 Personal history of nicotine dependence: Secondary | ICD-10-CM | POA: Diagnosis not present

## 2016-02-18 DIAGNOSIS — I639 Cerebral infarction, unspecified: Secondary | ICD-10-CM | POA: Diagnosis not present

## 2016-02-18 DIAGNOSIS — R278 Other lack of coordination: Secondary | ICD-10-CM | POA: Diagnosis not present

## 2016-02-18 DIAGNOSIS — E119 Type 2 diabetes mellitus without complications: Secondary | ICD-10-CM | POA: Diagnosis not present

## 2016-02-18 DIAGNOSIS — I358 Other nonrheumatic aortic valve disorders: Secondary | ICD-10-CM | POA: Diagnosis not present

## 2016-02-18 DIAGNOSIS — R262 Difficulty in walking, not elsewhere classified: Secondary | ICD-10-CM | POA: Diagnosis not present

## 2016-02-18 DIAGNOSIS — E1140 Type 2 diabetes mellitus with diabetic neuropathy, unspecified: Secondary | ICD-10-CM | POA: Diagnosis not present

## 2016-02-18 DIAGNOSIS — I69322 Dysarthria following cerebral infarction: Secondary | ICD-10-CM | POA: Diagnosis not present

## 2016-02-18 DIAGNOSIS — J441 Chronic obstructive pulmonary disease with (acute) exacerbation: Secondary | ICD-10-CM | POA: Diagnosis not present

## 2016-02-18 DIAGNOSIS — R41841 Cognitive communication deficit: Secondary | ICD-10-CM | POA: Diagnosis not present

## 2016-02-18 DIAGNOSIS — I69321 Dysphasia following cerebral infarction: Secondary | ICD-10-CM | POA: Diagnosis not present

## 2016-02-18 DIAGNOSIS — R1312 Dysphagia, oropharyngeal phase: Secondary | ICD-10-CM | POA: Diagnosis not present

## 2016-02-18 LAB — HEPATIC FUNCTION PANEL
ALK PHOS: 74 U/L (ref 25–125)
ALT: 7 U/L — AB (ref 10–40)
AST: 13 U/L — AB (ref 14–40)
Bilirubin, Total: 0.4 mg/dL

## 2016-02-18 LAB — BASIC METABOLIC PANEL
BUN: 29 mg/dL — AB (ref 4–21)
Creatinine: 1.8 mg/dL — AB (ref 0.6–1.3)
Glucose: 55 mg/dL
Potassium: 3.5 mmol/L (ref 3.4–5.3)
Sodium: 138 mmol/L (ref 137–147)

## 2016-02-18 LAB — CBC AND DIFFERENTIAL
HEMATOCRIT: 30 % — AB (ref 41–53)
HEMOGLOBIN: 9.1 g/dL — AB (ref 13.5–17.5)
PLATELETS: 290 10*3/uL (ref 150–399)
WBC: 11.2 10^3/mL

## 2016-02-19 ENCOUNTER — Encounter: Payer: Self-pay | Admitting: Family

## 2016-02-19 ENCOUNTER — Other Ambulatory Visit (HOSPITAL_COMMUNITY): Payer: Medicare Other

## 2016-02-19 ENCOUNTER — Ambulatory Visit: Payer: Medicare Other | Admitting: Internal Medicine

## 2016-02-19 ENCOUNTER — Other Ambulatory Visit: Payer: Self-pay

## 2016-02-19 ENCOUNTER — Ambulatory Visit (HOSPITAL_COMMUNITY): Payer: No Typology Code available for payment source | Attending: Internal Medicine

## 2016-02-19 ENCOUNTER — Telehealth: Payer: Self-pay | Admitting: Cardiology

## 2016-02-19 ENCOUNTER — Non-Acute Institutional Stay (SKILLED_NURSING_FACILITY): Payer: Medicare Other | Admitting: Family

## 2016-02-19 DIAGNOSIS — E119 Type 2 diabetes mellitus without complications: Secondary | ICD-10-CM | POA: Insufficient documentation

## 2016-02-19 DIAGNOSIS — I420 Dilated cardiomyopathy: Secondary | ICD-10-CM | POA: Insufficient documentation

## 2016-02-19 DIAGNOSIS — I052 Rheumatic mitral stenosis with insufficiency: Secondary | ICD-10-CM | POA: Diagnosis not present

## 2016-02-19 DIAGNOSIS — Z87891 Personal history of nicotine dependence: Secondary | ICD-10-CM | POA: Insufficient documentation

## 2016-02-19 DIAGNOSIS — E1140 Type 2 diabetes mellitus with diabetic neuropathy, unspecified: Secondary | ICD-10-CM

## 2016-02-19 DIAGNOSIS — I38 Endocarditis, valve unspecified: Secondary | ICD-10-CM | POA: Insufficient documentation

## 2016-02-19 DIAGNOSIS — I358 Other nonrheumatic aortic valve disorders: Secondary | ICD-10-CM | POA: Diagnosis not present

## 2016-02-19 DIAGNOSIS — I339 Acute and subacute endocarditis, unspecified: Secondary | ICD-10-CM | POA: Diagnosis not present

## 2016-02-19 DIAGNOSIS — D509 Iron deficiency anemia, unspecified: Secondary | ICD-10-CM | POA: Diagnosis not present

## 2016-02-19 DIAGNOSIS — I119 Hypertensive heart disease without heart failure: Secondary | ICD-10-CM | POA: Insufficient documentation

## 2016-02-19 DIAGNOSIS — J441 Chronic obstructive pulmonary disease with (acute) exacerbation: Secondary | ICD-10-CM

## 2016-02-19 DIAGNOSIS — K219 Gastro-esophageal reflux disease without esophagitis: Secondary | ICD-10-CM | POA: Diagnosis not present

## 2016-02-19 DIAGNOSIS — I071 Rheumatic tricuspid insufficiency: Secondary | ICD-10-CM | POA: Insufficient documentation

## 2016-02-19 DIAGNOSIS — I10 Essential (primary) hypertension: Secondary | ICD-10-CM | POA: Diagnosis not present

## 2016-02-19 DIAGNOSIS — D72829 Elevated white blood cell count, unspecified: Secondary | ICD-10-CM

## 2016-02-19 DIAGNOSIS — N183 Chronic kidney disease, stage 3 unspecified: Secondary | ICD-10-CM

## 2016-02-19 DIAGNOSIS — I272 Other secondary pulmonary hypertension: Secondary | ICD-10-CM | POA: Insufficient documentation

## 2016-02-19 DIAGNOSIS — I4891 Unspecified atrial fibrillation: Secondary | ICD-10-CM

## 2016-02-19 DIAGNOSIS — I2510 Atherosclerotic heart disease of native coronary artery without angina pectoris: Secondary | ICD-10-CM | POA: Insufficient documentation

## 2016-02-19 LAB — ECHOCARDIOGRAM COMPLETE
HEIGHTINCHES: 69 in
Weight: 2912 oz

## 2016-02-19 NOTE — Telephone Encounter (Signed)
Pt is in [place] changed his antibiotic. She wants to know if  Dr Stanford Breed made this change or was he aware of the change?

## 2016-02-19 NOTE — Progress Notes (Signed)
Location:  [facility] Room Number: 1203 Place of Service:  SNF [phone]) Provider:  Marlowe Sax, FNP-C Blanchie Serve, MD   [name], MD  Patient Care Team: [name], MD as PCP - General  Extended Emergency Contact Information Primary Emergency Contact: Manon Hilding Address: [address]          [place], [place] [address] Johnnette Litter of [place] Phone: [phone] Mobile Phone: [phone] Relation: Daughter  Code Status: DNR Goals of care: Advanced Directive information Advanced Directives 02/19/2016  Does patient have an advance directive? Yes  Type of Advance Directive Out of facility DNR (pink MOST or yellow form)  Does patient want to make changes to advanced directive? No - Patient declined  Copy of advanced directive(s) in chart? Yes     Chief Complaint  Patient presents with  . Acute Visit    Follow up on Abnormal Labs    HPI:  Pt is a 82 y.o. male seen today at [facility] for discharge home. 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 He is seen in his room today.During the facility stay he was on Ampicillin I.V. Started on 01/08/2016-02/18/2016 but developed a rash 02/13/2016 treated with Benadryl to help symptoms. Infectious Disease recommended change to I.V. Vancomycin  1000 mg X 5 dose and short tapered steroids. Patient scheduled for follow-up with infectious disease. Patient's daughter request discharge home with [facility] RN to continue with I.V. Vancomycin.He has worked with PT/OT will be discharged with PT/OT to continue with ROM, Exercise, gait stability and muscle strengthening. He will require [facility] RN for medication management and Vancomycin trough level lab draw. HH aid to assist with ADL's. He will  require DME Oxygen Concentrator and gas portable @ 2 liters via Nasal cannula.    Past Medical History  Diagnosis Date  . Cerebrovascular disease, unspecified   . Coronary atherosclerosis of unspecified type of vessel, native or graft     s/p CABG 2010  . Esophageal reflux   . Pure hypercholesterolemia   . Essential hypertension, benign   . Type II or unspecified type diabetes mellitus with neurological manifestations, not stated as uncontrolled   . Bilateral hydrocele 3/11    [facility]  . History of aortic stenosis     s/p valve replacment 2010  . Cervicalgia   . Foraminal stenosis of cervical region     bilateral-C4-C5, C5-C6  . Colon polyp   . Shingles 07/27/2012  . 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 Cataract extraction  1990's    bilateral  . Cholecystectomy  1995  . Colonoscopy  1997    Mult. polyps- [facility]  . Sphincterectomy  09/20/03    for jaundice  . Ptca  12/99    with stent  . Coronary artery bypass graft  3/10    x3-using a left internal mammary artery to the left anterior descending coronary artery, saphenous vein graft to circumflex marginal branch, spahenous vein graft  to posterior descendingcoronary artery. Endoscopic saphenous vein harvest from bilateral thighs was done.   . Aortic valve replacement  12/2008    with pericardial tissue valve  . Carotid US  10/2009    B ICA stenosis, stable disease,  rec rpt 2 years  . Hydrocele excision      bilateral ([facility])  . L leg trauma  1957    truck over left leg  . Total hip arthroplasty Left 06/30/2014    Procedure: LEFT TOTAL HIP ARTHROPLASTY ANTERIOR APPROACH;  Surgeon: Mcarthur Rossetti, MD;  Location: [facility];  Service: Orthopedics;  Laterality: Left;  . Tee without cardioversion N/A 01/09/2016    Procedure: TRANSESOPHAGEAL ECHOCARDIOGRAM (TEE);  Surgeon: Dorothy Spark,  MD;  Location: [facility];  Service: Cardiovascular;  Laterality: N/A;  . Tee without cardioversion N/A 01/15/2016    Procedure: TRANSESOPHAGEAL ECHOCARDIOGRAM (TEE);  Surgeon: Larey Dresser, MD;  Location: [place];  Service: Cardiovascular;  Laterality: N/A;    No Known Allergies    Medication List       This list is accurate as of: 02/19/16  2:17 PM.  Always use your most recent med list.               aspirin 81 MG chewable tablet  Chew 81 mg by mouth daily.     atorvastatin 20 MG tablet  Commonly known as:  LIPITOR  Take 1 tablet (20 mg total) by mouth daily at 6 PM.     diltiazem 120 MG tablet  Commonly known as:  CARDIZEM  Take 120 mg by mouth 3 (three) times daily. HR < 60/min SBP < 110     feeding supplement (PRO-STAT SUGAR FREE 64) Liqd  Take 30 mLs by mouth 2 (two) times daily with a meal.     ferrous sulfate 325 (65 FE) MG tablet  Take 325 mg by mouth daily.     furosemide 20 MG tablet  Commonly known as:  LASIX  3 by mouth daily, Hold for SBP < 110     insulin aspart 100 UNIT/ML injection  Commonly known as:  novoLOG  Inject 4 Units into the skin 3 (three) times daily with meals.     LANTUS 100 UNIT/ML injection  Generic drug:  insulin glargine  Check CBG. Inject 30 units sub q daily at bedtime     nitroGLYCERIN 0.4 MG SL tablet  Commonly known as:  NITROSTAT  Place 0.4 mg under the tongue every 5 (five) minutes as needed. Call MD/NP if pain unrelieved.     OXYGEN  Inhale 3 L into the lungs.     potassium chloride 10 MEQ CR capsule  Commonly known as:  MICRO-K  Take 10 mEq by mouth daily.     PREDNISONE (PAK) PO  Start with 40 mg by mouth for 3 days then 30 mg by mouth for 3 days, then 20 mg by mouth for 3 days, then 10 mg by mouth for 3 days then stop. Stop date 02/27/16     simethicone 40 MG/0.6ML drops  Commonly known as:  MYLICON  Take 40 mg by mouth 4 (four) times daily as needed for flatulence.     UNABLE TO FIND  Med Name:  Heparin 5 mL IV flush     vancomycin 1,000 mg in sodium chloride 0.9 % 250 mL  Give 1000 mg iv to be given every 48 hours x 5 doses. Administer over 2 hours.        Review of Systems  Constitutional: Negative for fever, chills, activity change, appetite change and fatigue.  HENT: Negative for congestion, rhinorrhea, sinus pressure, sneezing and sore throat.   Eyes: Negative.   Respiratory: Negative for cough, chest tightness and shortness of  breath.   Cardiovascular: Negative for chest pain, palpitations and leg swelling.  Gastrointestinal: Negative for nausea, vomiting, abdominal pain, diarrhea, constipation and abdominal distention.  Endocrine: Negative.   Genitourinary: Negative for dysuria, urgency and flank pain.  Musculoskeletal: Positive for gait problem.  Skin: Negative.   Neurological: Negative for dizziness, syncope, light-headedness and headaches.  Psychiatric/Behavioral: Negative for hallucinations, confusion, sleep disturbance and agitation. The patient is not nervous/anxious.     Immunization History  Administered Date(s) Administered  . PPD Test 01/17/2016, 01/31/2016  . Pneumococcal Conjugate-13 03/29/2015  . Pneumococcal Polysaccharide-23 11/21/2000  . Td 11/21/2000   Pertinent  Health Maintenance Due  Topic Date Due  . OPHTHALMOLOGY EXAM  10/20/2016 (Originally 11/27/1936)  . URINE MICROALBUMIN  03/04/2016  . FOOT EXAM  03/28/2016  . INFLUENZA VACCINE  05/20/2016  . HEMOGLOBIN A1C  06/26/2016  . PNA vac Low Risk Adult  Completed   Fall Risk  03/29/2015 12/01/2011  Falls in the past year? No -  Risk for fall due to : - History of fall(s)  Risk for fall due to (comments): - one fall in last year, did not injure himself   Functional Status Survey:    Filed Vitals:   02/19/16 1338  BP: 106/64  Pulse: 96  Temp: 98 F (36.7 C)  Resp: 20  Height: 5\' 9"  (1.753 m)  Weight: 182 lb (82.555 kg)  SpO2: 96%   Body mass index is 26.86 kg/(m^2). Physical Exam    Constitutional: He appears well-developed and well-nourished. No distress.  HENT:  Head: Normocephalic and atraumatic.  Mouth/Throat: Oropharynx is clear and moist.  Eyes: Conjunctivae and EOM are normal. Pupils are equal, round, and reactive to light. Right eye exhibits no discharge. Left eye exhibits no discharge. No scleral icterus.  Neck: Normal range of motion. No JVD present.  Cardiovascular: Normal rate, normal heart sounds and intact distal pulses.  Exam reveals no gallop and no friction rub.   No murmur heard. Irregular   Pulmonary/Chest: Effort normal and breath sounds normal. No respiratory distress. He has no wheezes. He has no rales.  Abdominal: Soft. Bowel sounds are normal. He exhibits no distension and no mass. There is no tenderness. There is no rebound and no guarding.  Musculoskeletal: Normal range of motion. He exhibits no edema or tenderness.  Lower extremities weakness   Lymphadenopathy:    He has no cervical adenopathy.  Neurological: He is alert.  Skin: Skin is warm and dry. No rash noted. No erythema. No pallor.  Psychiatric: He has a normal mood and affect.    Labs reviewed:  Recent Labs  12/30/15 0503 12/31/15 0236  01/10/16 0300  01/16/16 0221  01/17/16 0502  01/30/16 0147 02/14/16 02/16/16 02/18/16  NA 143 145  < > 139  < > 138  < > 136  < > 138 138  --  138  K 3.8 3.4*  < > 4.2  < > 3.3*  --  3.8  < > 3.8 3.6  --  3.5  CL 113* 115*  < > 100*  < > 100*  --  100*  --  106  --   --   --   CO2 21* 22  < > 29  < > 30  --  29  --  24  --   --   --   GLUCOSE 158* 154*  < > 381*  < > 82  --  165*  --  166*  --   --   --  BUN 39* 26*  < > 36*  < > 31*  < > 30*  < > 22* 26* 24* 29*  CREATININE 1.92* 1.72*  < > 1.98*  < > 2.07*  < > 2.03*  < > 1.64* 1.8* 1.8* 1.8*  CALCIUM 8.5* 8.3*  < > 8.0*  < > 8.1*  --  8.5*  --  7.7*  --   --   --   MG 1.8 1.7  --  1.7  --   --   --   --   --   --   --   --   --   < > = values in this interval not displayed.  Recent  Labs  12/31/15 0236 01/06/16 1736 01/22/16 01/30/16 0147 02/18/16  AST 16 22 12* 17 13*  ALT 23 19 13  9* 7*  ALKPHOS 39 48 52 54 74  BILITOT 0.8 1.2  --  0.8  --   PROT 5.3* 5.6*  --  5.4*  --   ALBUMIN 2.4* 2.4*  --  2.0*  --     Recent Labs  12/31/15 0236  01/16/16 0221  01/17/16 0502  01/30/16 0149  02/07/16 02/14/16 02/18/16  WBC 9.8  < > 4.2  < > 4.4  < > 6.0  < > 5.9 13.7 11.2  NEUTROABS 9.1*  --   --   --  3.8  --  5.0  --   --   --   --   HGB 9.1*  < > 9.7*  --  10.2*  < > 7.2*  < > 8.3* 9.9* 9.1*  HCT 29.3*  < > 30.4*  --  31.6*  < > 21.3*  < > 26* 32* 30*  MCV 93.3  < > 91.8  --  91.1  --  91.8  --   --   --   --   PLT 212  < > 170  --  177  < > 131*  < > 492* 495* 290  < > = values in this interval not displayed. Lab Results  Component Value Date   TSH 0.643 12/28/2015   Lab Results  Component Value Date   HGBA1C 7.1* 12/25/2015   Lab Results  Component Value Date   CHOL 107 01/07/2016   HDL 33* 01/07/2016   LDLCALC 52 01/07/2016   LDLDIRECT 51.0 03/05/2015   TRIG 112 01/07/2016   CHOLHDL 3.2 01/07/2016    Significant Diagnostic Results in last 30 days:  Dg Chest 2 View  01/30/2016  CLINICAL DATA:  Vomiting and fever for 2 days.  Unresponsive. EXAM: CHEST  2 VIEW COMPARISON:  01/09/2016 FINDINGS: There is a right jugular central line with tip in the SVC near the azygos vein junction. There is prior sternotomy, CABG and aortic valvuloplasty. There are alveolar opacities in the central lung regions bilaterally. There is moderate vascular prominence. No effusions. IMPRESSION: Central alveolar opacities and moderate vascular prominence, suggesting congestive heart failure. Cannot exclude infectious infiltrates. Electronically Signed   By: Andreas Newport M.D.   On: 01/30/2016 03:29   Ct Abdomen Pelvis W Contrast  01/30/2016  CLINICAL DATA:  Fever and abdominal pain. 3 episodes of coffee ground emesis. History of biliary sphincterotomy due to jaundice. EXAM:  CT ABDOMEN AND PELVIS WITH CONTRAST TECHNIQUE: Multidetector CT imaging of the abdomen and pelvis was performed using the standard protocol following bolus administration of intravenous contrast. CONTRAST:  93mL ISOVUE-300 IOPAMIDOL (ISOVUE-300) INJECTION 61% COMPARISON:  Fever and  abdominal pain. Three episodes of coffee ground emesis. FINDINGS: Lower chest: Evaluation of the lower thorax is degraded secondary to patient respiratory artifact. Trace bilateral effusions with associated dependent ground-glass atelectasis. No discrete focal airspace opacities given limitation of the examination. Normal heart size. Exuberant calcifications within the mitral valve annulus. Suspected calcifications within the aortic valve annulus. Hepatobiliary: Normal hepatic contour. There is a minimal amount of centralized pneumobilia within the nondependent left lobe of the liver compatible of prior history of biliary sphincterotomy. Post cholecystectomy. There is mild dilatation of the common bile duct, measuring, presumably the sequela of post cholecystectomy state. There is a punctate (approximately 7 mm) apparent filling defect within the distal aspect of the CBD (image 20, series 2), which may represent choledocholithiasis versus sludge. Pancreas: Normal in appearance.  No peripancreatic stranding. Spleen: Normal in appearance. No evidence splenomegaly or. Splenic stranding. Adrenals/Urinary Tract: There is symmetric enhancement of the bilateral kidneys. The bilateral kidneys appear mildly atrophic. No discrete renal lesions. No renal stones in this postcontrast examination. There is a minimal amount of bilateral perinephric stranding. No urinary obstruction. Stomach/Bowel: Large colonic stool burden. Extensive diverticulosis without evidence of diverticulitis. Normal appearance of the terminal ileum. There is a minimal amount of high-density material within the base of the appendix favored to represent a tiny (approximately 0.4  cm) at appendicolith. No appendiceal thickening or periappendiceal stranding. No pneumoperitoneum, pneumatosis or portal venous gas. Vascular/Lymphatic: Large amount of irregular mixed calcified and noncalcified atherosclerotic plaque within a tortuous and mildly aneurysmal abdominal aorta which measures approximately 3.2 cm in greatest diameter (image 44, series 2). Potential hemodynamically significant narrowing involving the left common iliac artery (image 53, series 2 and left common femoral arteries (image 81, series 2). No bulky retroperitoneal, mesenteric, pelvic or inguinal lymphadenopathy. Reproductive: Normal in appearance, though evaluation degraded secondary streak artifact from patient's left total hip prosthesis. Normal appearance of the urinary bladder given degree distention. No free fluid in the pelvic cul-de-sac. Other: Regional soft tissues appear normal. Musculoskeletal: Post left total hip replacements without evidence of hardware failure or loosening. IMPRESSION: 1. Punctate (approximately 7 mm) apparent filling defect in the distal aspect of the CBD may represent choledocholithiasis versus biliary sludge. Correlation with LFTs is recommended. Further evaluation with MRCP or ERCP could be performed as clinically indicated. 2. Extensive colonic diverticulosis without evidence of diverticulitis. 3. Punctate (approximately 4 mm) at appendicolith within otherwise normal-appearing appendix. 4. Large amount of irregular mixed calcified and noncalcified atherosclerotic plaque within a mildly aneurysmal abdominal aorta measuring 3.2 cm in diameter. Recommend followup by ultrasound in 3 years. This recommendation follows ACR consensus guidelines: White Paper of the ACR Incidental Findings Committee II on Vascular Findings. Natasha Mead Coll Radiol 2013; [phone] This exam was interpreted during a PACS downtime with limited availability of comparison cases. It has been flagged for review following the  downtime. If clinically indicated after this review, an addendum will be issued providing details about comparison to prior imaging. Electronically Signed   By: Sandi Mariscal M.D.   On: 01/30/2016 07:48    Assessment/Plan Endocarditis  Afebrile. He was discharge from [facility] on Ampicillin I.V. Started on 01/08/2016-02/18/2016 but developed a rash 02/13/2016 treated with Benadryl to help symptoms. Infectious Disease recommended change to I.V. Vancomycin  1000 mg X 5 dose and short tapered steroids. . On discharge he will have had 3 dose of 5 and will be due for prednisone 20 mg tablet X 3 days then 10 mg tablet X 3 days then  stop. HH RN for medication management and Vancomycin trough level lab draws. Flush PICC line per Home health protocol. Follow up with cardiologist and infectious disease as scheduled. CBC, BMP in 1-2 weeks with PCP.   Afib w/RVR On recent [facility] discharge Cardiologist suggested ASA for now and consider anticoagulation on outpatient follow up. Continue on Diltiazem 120 mg Tablet. Bisprolol discontinued due to hypotension. Follow up with cardiologist as directed last seen 01/23/2016 for Echo in May 2nd 2017.     HTN  hypotension during STR Bisoprolol 2.5 mg Tablet discontinued. B/P now stable. Continue diltiazem 120 mg tablet. Monitor B/p. BMP in 1-2 weeks with PCP.   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 Improved with steroid treatment. Continues to require oxygen supplementation at 2  Liters nasal cannula. Exam findings negative for shortness of breath, wheezing or coughing.   CHF No weight gain, shortness of breath or rales on exam. Continue on Furosemide 20 mg Tablet.Follow up with Cardiologist as scheduled. BMP in 1-2 weeks with PCP  GERD Continue on Famotidine 20 mg Tablet.  Type 2 DM CBG's stable.Recent Hgb A1C 7.1(12/25/2015) Testing CBG's  ACHS. Continue on Lantus 30 units SQ QHS, Novolog 4 units Three times with meals. PCP to monitor Hgb A1C  CKD stage 3  Recent CR 1.78 (02/18/2016) baseline 1.70 (07/11/2014) Continue to avoid nephrotoxins and dose all other medications for renal clearance. BMP with PCP in 1-2 weeks.  Leukocytosis Afebrile. WBC 11.2 (02/18/2016) trending down from previous level 13.7 (02/14/2016). Currently on  I.V. Vancomycin  1000 mg X 5 dose and short tapered steroids.On discharge he will have had 3 dose of 5 and will be due for prednisone 20 mg tablet X 3 days then 10 mg tablet X 3 days then stop. [facility] RN for medication management and Vancomycin trough level lab draws. Flush PICC line per Home health protocol. Follow up with cardiologist and infectious disease as scheduled. CBC, BMP in 1-2 weeks with PCP.   Anemia   Hgb 9.1 (02/18/2016) previous 8.3  Stool Guaiac negative X 3 .Started on Ferrous sulfate 325 mg Tablet  Daily. Recheck CBC in 1-2 weeks with PCP.   Patient is being discharged with the following home health services:  PT/OT to continue with ROM, Exercise, gait stability and muscle strengthening.   HH RN for medication management and I.V Vancomycin trough level lab draw.  HH aid to assist with ADL's.  Patient is being discharged with the following durable medical equipment:     He will require DME Oxygen Concentrator and gas portable @ 2 liters via Nasal cannula. DX. COPD, CHF  Rx written X 1 month supply   Patient has been advised to f/u with their PCP in 1-2 weeks to bring them up to date on their rehab stay.  Social services at facility was responsible for arranging this appointment.  Pt was provided with a 30 day supply of prescriptions for medications and refills must be obtained from their PCP.  For controlled substances, a more limited supply may be provided adequate until PCP appointment only.  Labs/tests ordered:  CBC, BMP in 1-2 weeks with PCP

## 2016-02-19 NOTE — Telephone Encounter (Signed)
Management of antibiotics per id Kirk Ruths

## 2016-02-19 NOTE — Progress Notes (Signed)
Location:  [place] Room Number: 1203 Place of Service:  SNF [phone])  Provider: Marlowe Sax, FNP-C  PCP: [name], MD Patient Care Team: [name], MD as PCP - General  Extended Emergency Contact Information Primary Emergency Contact: Manon Hilding Address: [address]          [place], [place] [address] Johnnette Litter of [place] Phone: [phone] Mobile Phone: [phone] Relation: Daughter  Code Status: DNR  Goals of care:  Advanced Directive information Advanced Directives 02/19/2016  Does patient have an advance directive? Yes  Type of Advance Directive Out of facility DNR (pink MOST or yellow form)  Does patient want to make changes to advanced directive? No - Patient declined  Copy of advanced directive(s) in chart? Yes     No Known Allergies  Chief Complaint  Patient presents with  . Discharge Note    Discharge from SNF    HPI:  82 y.o. male  Seen at [facility] for evaluation of  Rash.He complains of itchy generalized rash which started overnight. Rash does not seems to be getting worse. He denies any fever, chills, cough or closing of the throat. Of note he is currently on Ampicillin I.V. Started on 01/08/2016-02/18/2016 for endocarditis     Past Medical History  Diagnosis Date  . Cerebrovascular disease, unspecified   . Coronary atherosclerosis of unspecified type of vessel, native or graft     s/p CABG 2010  . Esophageal reflux   . Pure hypercholesterolemia   . Essential hypertension, benign   . Type II or unspecified type diabetes mellitus with neurological manifestations, not stated as uncontrolled   . Bilateral hydrocele 3/11    [facility]  . History of aortic stenosis     s/p valve replacment 2010  . Cervicalgia   . Foraminal stenosis of cervical region     bilateral-C4-C5, C5-C6  . Colon polyp   . Shingles 07/27/2012  . 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 Cataract extraction  1990's    bilateral  . Cholecystectomy  1995  . Colonoscopy  1997    Mult. polyps- [facility]  . Sphincterectomy  09/20/03    for jaundice  . Ptca  12/99    with stent  . Coronary artery bypass graft  3/10    x3-using a left internal mammary artery to the left anterior descending coronary artery, saphenous vein graft to circumflex marginal branch, spahenous vein graft  to posterior descendingcoronary artery. Endoscopic saphenous vein harvest from bilateral thighs was done.   . Aortic valve replacement  12/2008    with pericardial tissue valve  . Carotid US  10/2009    B ICA stenosis, stable disease, rec rpt 2 years  . Hydrocele excision      bilateral ([facility])  . L leg trauma  1957    truck over left leg  . Total hip arthroplasty Left 06/30/2014    Procedure: LEFT TOTAL HIP ARTHROPLASTY ANTERIOR APPROACH;  Surgeon: Mcarthur Rossetti, MD;  Location: [facility];  Service: Orthopedics;  Laterality: Left;  . Tee without cardioversion N/A 01/09/2016    Procedure: TRANSESOPHAGEAL ECHOCARDIOGRAM (TEE);  Surgeon: Dorothy Spark, MD;  Location: [facility];  Service: Cardiovascular;  Laterality: N/A;  . Tee without cardioversion N/A 01/15/2016    Procedure: TRANSESOPHAGEAL ECHOCARDIOGRAM (TEE);  Surgeon: Larey Dresser, MD;  Location: [facility];  Service: Cardiovascular;  Laterality: N/A;      reports that he quit smoking about 22 years ago. His smoking use included Cigarettes. He has a 50 pack-year smoking history. He has never used smokeless tobacco. He reports that he drinks alcohol. He reports that he does not use illicit drugs. Social History   Social History  . Marital Status: Widowed    Spouse Name: N/A  . Number of Children: 3  . Years of Education: N/A   Occupational History  . retired-truck driver    Social History Main Topics  .  Smoking status: Former Smoker -- 1.00 packs/day for 50 years    Types: Cigarettes    Quit date: 10/20/1993  . Smokeless tobacco: Never Used  . Alcohol Use: 0.0 oz/week    0 Standard drinks or equivalent per week     Comment: occasional  . Drug Use: No  . Sexual Activity: No   Other Topics Concern  . Not on file   Social History Narrative   Caffeine: 2-3 cups coffee   Lives with son   Retired Educational psychologist, lives with 2 sons, 1 daughter in [place]   Activity: no regular exercise 2/2 hip pain and SOB.   Functional Status Survey:    No Known Allergies  Pertinent  Health Maintenance Due  Topic Date Due  . OPHTHALMOLOGY EXAM  10/20/2016 (Originally 11/27/1936)  . URINE MICROALBUMIN  03/04/2016  . FOOT EXAM  03/28/2016  . INFLUENZA VACCINE  05/20/2016  . HEMOGLOBIN A1C  06/26/2016  . PNA vac Low Risk Adult  Completed    Medications:   Medication List       This list is accurate as of: 02/13/16 11:59 PM.  Always use your most recent med list.               ampicillin 2 g in sodium chloride 0.9 % 50 mL  Inject 2 g into the vein every 6 (six) hours.     aspirin 81 MG chewable tablet  Chew 81 mg by mouth daily.     atorvastatin 20 MG tablet  Commonly known as:  LIPITOR  Take 1 tablet (20 mg total) by mouth daily at 6 PM.     BISOPROLOL FUMARATE PO  Take 1/2 tablet  = 2.5 mg tablet by mouth daily for HTN/Afib     diltiazem 120 MG tablet  Commonly known as:  CARDIZEM  Take 120 mg by mouth 3 (three) times daily. HR < 60/min SBP < 110     feeding supplement (PRO-STAT SUGAR FREE 64) Liqd  Take 30 mLs by mouth 2 (two) times daily with a meal.     ferrous sulfate 325 (65 FE) MG tablet  Take 325 mg by mouth daily.     furosemide 20 MG tablet  Commonly known as:  LASIX  3 by mouth daily, Hold for SBP < 110     insulin aspart 100 UNIT/ML injection  Commonly known as:  novoLOG  Inject 4 Units into the skin 3 (three) times daily with meals.     insulin  glargine 100 UNIT/ML injection  Commonly known as:  LANTUS  Inject 0.3 mLs (30 Units total) into the skin daily.     nitroGLYCERIN 0.4 MG SL tablet  Commonly known as:  NITROSTAT  Place 0.4 mg under the tongue every 5 (five) minutes as needed. Call MD/NP if pain unrelieved.     OXYGEN  Inhale 3 L  into the lungs.     potassium chloride 10 MEQ CR capsule  Commonly known as:  MICRO-K  Take 10 mEq by mouth daily.     simethicone 40 MG/0.6ML drops  Commonly known as:  MYLICON  Take 40 mg by mouth 4 (four) times daily as needed for flatulence.     UNABLE TO FIND  Med Name: Heparin 5 mL IV flush        Review of Systems  Constitutional: Negative for fever, chills, activity change, appetite change and fatigue.  HENT: Negative for congestion, rhinorrhea, sinus pressure, sneezing and sore throat.   Eyes: Negative.   Respiratory: Negative for cough, chest tightness and shortness of breath.   Cardiovascular: Negative for chest pain, palpitations and leg swelling.  Gastrointestinal: Negative for nausea, vomiting, abdominal pain, diarrhea, constipation and abdominal distention.  Musculoskeletal: Positive for gait problem.  Skin: Negative.   Neurological: Negative for dizziness, syncope, light-headedness and headaches.  Psychiatric/Behavioral: Negative for confusion and agitation.    Filed Vitals:   02/13/16 1030  BP: 134/75  Pulse: 82  Temp: 96.2 F (35.7 C)  Resp: 19  Height: 5\' 9"  (1.753 m)  Weight: 213 lb 3.2 oz (96.707 kg)  SpO2: 98%   Body mass index is 31.47 kg/(m^2). Physical Exam  Constitutional: He appears well-developed and well-nourished. No distress.  HENT:  Head: Normocephalic.  Mouth/Throat: Oropharynx is clear and moist.  Eyes: Conjunctivae and EOM are normal. Pupils are equal, round, and reactive to light.  Neck: Normal range of motion. No JVD present.  Cardiovascular: Normal rate and intact distal pulses.  Exam reveals no gallop and no friction rub.   No  murmur heard. Irregular   Pulmonary/Chest: Effort normal and breath sounds normal. No respiratory distress. He has no wheezes. He has no rales.  Abdominal: Soft. Bowel sounds are normal. He exhibits no distension. There is no tenderness. There is no rebound and no guarding.  Musculoskeletal: Normal range of motion. He exhibits no edema or tenderness.  Bilat. LE's weakness   Lymphadenopathy:    He has no cervical adenopathy.  Neurological: He is alert.  Skin: Skin is warm and dry. No erythema. No pallor.  Generalized red macular rash on trunk and arms and legs. Scratch marks noted.   Psychiatric: He has a normal mood and affect.    Labs reviewed: Basic Metabolic Panel:  Recent Labs  12/30/15 0503 12/31/15 0236  01/10/16 0300  01/16/16 0221  01/17/16 0502  01/30/16 0147 02/14/16 02/16/16 02/18/16  NA 143 145  < > 139  < > 138  < > 136  < > 138 138  --  138  K 3.8 3.4*  < > 4.2  < > 3.3*  --  3.8  < > 3.8 3.6  --  3.5  CL 113* 115*  < > 100*  < > 100*  --  100*  --  106  --   --   --   CO2 21* 22  < > 29  < > 30  --  29  --  24  --   --   --   GLUCOSE 158* 154*  < > 381*  < > 82  --  165*  --  166*  --   --   --   BUN 39* 26*  < > 36*  < > 31*  < > 30*  < > 22* 26* 24* 29*  CREATININE 1.92* 1.72*  < > 1.98*  < >  2.07*  < > 2.03*  < > 1.64* 1.8* 1.8* 1.8*  CALCIUM 8.5* 8.3*  < > 8.0*  < > 8.1*  --  8.5*  --  7.7*  --   --   --   MG 1.8 1.7  --  1.7  --   --   --   --   --   --   --   --   --   < > = values in this interval not displayed. Liver Function Tests:  Recent Labs  12/31/15 0236 01/06/16 1736 01/22/16 01/30/16 0147 02/18/16  AST 16 22 12* 17 13*  ALT 23 19 13  9* 7*  ALKPHOS 39 48 52 54 74  BILITOT 0.8 1.2  --  0.8  --   PROT 5.3* 5.6*  --  5.4*  --   ALBUMIN 2.4* 2.4*  --  2.0*  --    No results for input(s): LIPASE, AMYLASE in the last 8760 hours.  Recent Labs  01/06/16 1736  AMMONIA 18   CBC:  Recent Labs  12/31/15 0236  01/16/16 0221  01/17/16 0502   01/30/16 0149  02/07/16 02/14/16 02/18/16  WBC 9.8  < > 4.2  < > 4.4  < > 6.0  < > 5.9 13.7 11.2  NEUTROABS 9.1*  --   --   --  3.8  --  5.0  --   --   --   --   HGB 9.1*  < > 9.7*  --  10.2*  < > 7.2*  < > 8.3* 9.9* 9.1*  HCT 29.3*  < > 30.4*  --  31.6*  < > 21.3*  < > 26* 32* 30*  MCV 93.3  < > 91.8  --  91.1  --  91.8  --   --   --   --   PLT 212  < > 170  --  177  < > 131*  < > 492* 495* 290  < > = values in this interval not displayed. Cardiac Enzymes:  Recent Labs  12/25/15 2234 12/26/15 0252 01/30/16 0446  TROPONINI 0.36* 0.11* 0.03   BNP: Invalid input(s): POCBNP CBG:  Recent Labs  01/17/16 0603 01/17/16 1120 01/17/16 1628  GLUCAP 151* 187* 156*    Procedures and Imaging Studies During Stay: Dg Chest 2 View  01/30/2016  CLINICAL DATA:  Vomiting and fever for 2 days.  Unresponsive. EXAM: CHEST  2 VIEW COMPARISON:  01/09/2016 FINDINGS: There is a right jugular central line with tip in the SVC near the azygos vein junction. There is prior sternotomy, CABG and aortic valvuloplasty. There are alveolar opacities in the central lung regions bilaterally. There is moderate vascular prominence. No effusions. IMPRESSION: Central alveolar opacities and moderate vascular prominence, suggesting congestive heart failure. Cannot exclude infectious infiltrates. Electronically Signed   By: Andreas Newport M.D.   On: 01/30/2016 03:29   Ct Abdomen Pelvis W Contrast  01/30/2016  CLINICAL DATA:  Fever and abdominal pain. 3 episodes of coffee ground emesis. History of biliary sphincterotomy due to jaundice. EXAM: CT ABDOMEN AND PELVIS WITH CONTRAST TECHNIQUE: Multidetector CT imaging of the abdomen and pelvis was performed using the standard protocol following bolus administration of intravenous contrast. CONTRAST:  36mL ISOVUE-300 IOPAMIDOL (ISOVUE-300) INJECTION 61% COMPARISON:  Fever and abdominal pain. Three episodes of coffee ground emesis. FINDINGS: Lower chest: Evaluation of the lower  thorax is degraded secondary to patient respiratory artifact. Trace bilateral effusions with associated dependent ground-glass atelectasis. No  discrete focal airspace opacities given limitation of the examination. Normal heart size. Exuberant calcifications within the mitral valve annulus. Suspected calcifications within the aortic valve annulus. Hepatobiliary: Normal hepatic contour. There is a minimal amount of centralized pneumobilia within the nondependent left lobe of the liver compatible of prior history of biliary sphincterotomy. Post cholecystectomy. There is mild dilatation of the common bile duct, measuring, presumably the sequela of post cholecystectomy state. There is a punctate (approximately 7 mm) apparent filling defect within the distal aspect of the CBD (image 20, series 2), which may represent choledocholithiasis versus sludge. Pancreas: Normal in appearance.  No peripancreatic stranding. Spleen: Normal in appearance. No evidence splenomegaly or. Splenic stranding. Adrenals/Urinary Tract: There is symmetric enhancement of the bilateral kidneys. The bilateral kidneys appear mildly atrophic. No discrete renal lesions. No renal stones in this postcontrast examination. There is a minimal amount of bilateral perinephric stranding. No urinary obstruction. Stomach/Bowel: Large colonic stool burden. Extensive diverticulosis without evidence of diverticulitis. Normal appearance of the terminal ileum. There is a minimal amount of high-density material within the base of the appendix favored to represent a tiny (approximately 0.4 cm) at appendicolith. No appendiceal thickening or periappendiceal stranding. No pneumoperitoneum, pneumatosis or portal venous gas. Vascular/Lymphatic: Large amount of irregular mixed calcified and noncalcified atherosclerotic plaque within a tortuous and mildly aneurysmal abdominal aorta which measures approximately 3.2 cm in greatest diameter (image 44, series 2). Potential  hemodynamically significant narrowing involving the left common iliac artery (image 53, series 2 and left common femoral arteries (image 81, series 2). No bulky retroperitoneal, mesenteric, pelvic or inguinal lymphadenopathy. Reproductive: Normal in appearance, though evaluation degraded secondary streak artifact from patient's left total hip prosthesis. Normal appearance of the urinary bladder given degree distention. No free fluid in the pelvic cul-de-sac. Other: Regional soft tissues appear normal. Musculoskeletal: Post left total hip replacements without evidence of hardware failure or loosening. IMPRESSION: 1. Punctate (approximately 7 mm) apparent filling defect in the distal aspect of the CBD may represent choledocholithiasis versus biliary sludge. Correlation with LFTs is recommended. Further evaluation with MRCP or ERCP could be performed as clinically indicated. 2. Extensive colonic diverticulosis without evidence of diverticulitis. 3. Punctate (approximately 4 mm) at appendicolith within otherwise normal-appearing appendix. 4. Large amount of irregular mixed calcified and noncalcified atherosclerotic plaque within a mildly aneurysmal abdominal aorta measuring 3.2 cm in diameter. Recommend followup by ultrasound in 3 years. This recommendation follows ACR consensus guidelines: White Paper of the ACR Incidental Findings Committee II on Vascular Findings. Natasha Mead Coll Radiol 2013; [phone] This exam was interpreted during a PACS downtime with limited availability of comparison cases. It has been flagged for review following the downtime. If clinically indicated after this review, an addendum will be issued providing details about comparison to prior imaging. Electronically Signed   By: Sandi Mariscal M.D.   On: 01/30/2016 07:48    Assessment/Plan:   Macular Rash  Generalized itchy rash on trunk,arms and legs. Suspect possible delayed reaction to ampicillin  I.V. Started on 01/08/2016-02/18/2016 for  endocarditis managed by Infectious disease. Start benadryl 25 mg tablet every 6 hours PRN x 5 days.Will contact infectious disease ASAP. CBC, BMP 02/14/2016    Reviewed plan of care with DR. Bubba Camp, patient and facility Nurse supervisor.   Labs: CBC, BMP 02/14/2016

## 2016-02-19 NOTE — Telephone Encounter (Signed)
Spoke with pt dtr, the pt was changed from ampicillin to vancomycin, according to the note in the chart because of a rash. The pts dtr wants to make sure dr Stanford Breed is aware of the change and agrees with it. Explained the change was made by the inf disease doctor but the dtr wanted to make sure dr Stanford Breed was aware.  Will forward for dr Stanford Breed review

## 2016-02-21 ENCOUNTER — Telehealth: Payer: Self-pay

## 2016-02-21 ENCOUNTER — Other Ambulatory Visit: Payer: Self-pay

## 2016-02-21 DIAGNOSIS — E7800 Pure hypercholesterolemia, unspecified: Secondary | ICD-10-CM | POA: Diagnosis not present

## 2016-02-21 DIAGNOSIS — I339 Acute and subacute endocarditis, unspecified: Secondary | ICD-10-CM | POA: Diagnosis not present

## 2016-02-21 DIAGNOSIS — Z954 Presence of other heart-valve replacement: Secondary | ICD-10-CM | POA: Diagnosis not present

## 2016-02-21 DIAGNOSIS — Z794 Long term (current) use of insulin: Secondary | ICD-10-CM | POA: Diagnosis not present

## 2016-02-21 DIAGNOSIS — E1122 Type 2 diabetes mellitus with diabetic chronic kidney disease: Secondary | ICD-10-CM | POA: Diagnosis not present

## 2016-02-21 DIAGNOSIS — Z9181 History of falling: Secondary | ICD-10-CM | POA: Diagnosis not present

## 2016-02-21 DIAGNOSIS — I130 Hypertensive heart and chronic kidney disease with heart failure and stage 1 through stage 4 chronic kidney disease, or unspecified chronic kidney disease: Secondary | ICD-10-CM | POA: Diagnosis not present

## 2016-02-21 DIAGNOSIS — M4802 Spinal stenosis, cervical region: Secondary | ICD-10-CM | POA: Diagnosis not present

## 2016-02-21 DIAGNOSIS — D509 Iron deficiency anemia, unspecified: Secondary | ICD-10-CM | POA: Diagnosis not present

## 2016-02-21 DIAGNOSIS — Z792 Long term (current) use of antibiotics: Secondary | ICD-10-CM | POA: Diagnosis not present

## 2016-02-21 DIAGNOSIS — I2510 Atherosclerotic heart disease of native coronary artery without angina pectoris: Secondary | ICD-10-CM | POA: Diagnosis not present

## 2016-02-21 DIAGNOSIS — I4891 Unspecified atrial fibrillation: Secondary | ICD-10-CM | POA: Diagnosis not present

## 2016-02-21 DIAGNOSIS — Z7984 Long term (current) use of oral hypoglycemic drugs: Secondary | ICD-10-CM | POA: Diagnosis not present

## 2016-02-21 DIAGNOSIS — Z9981 Dependence on supplemental oxygen: Secondary | ICD-10-CM | POA: Diagnosis not present

## 2016-02-21 DIAGNOSIS — E1140 Type 2 diabetes mellitus with diabetic neuropathy, unspecified: Secondary | ICD-10-CM | POA: Diagnosis not present

## 2016-02-21 DIAGNOSIS — I509 Heart failure, unspecified: Secondary | ICD-10-CM | POA: Diagnosis not present

## 2016-02-21 DIAGNOSIS — Z7982 Long term (current) use of aspirin: Secondary | ICD-10-CM | POA: Diagnosis not present

## 2016-02-21 DIAGNOSIS — N183 Chronic kidney disease, stage 3 (moderate): Secondary | ICD-10-CM | POA: Diagnosis not present

## 2016-02-21 DIAGNOSIS — Z452 Encounter for adjustment and management of vascular access device: Secondary | ICD-10-CM | POA: Diagnosis not present

## 2016-02-21 DIAGNOSIS — Z8673 Personal history of transient ischemic attack (TIA), and cerebral infarction without residual deficits: Secondary | ICD-10-CM | POA: Diagnosis not present

## 2016-02-21 DIAGNOSIS — B951 Streptococcus, group B, as the cause of diseases classified elsewhere: Secondary | ICD-10-CM | POA: Diagnosis not present

## 2016-02-21 DIAGNOSIS — J441 Chronic obstructive pulmonary disease with (acute) exacerbation: Secondary | ICD-10-CM | POA: Diagnosis not present

## 2016-02-21 DIAGNOSIS — Z951 Presence of aortocoronary bypass graft: Secondary | ICD-10-CM | POA: Diagnosis not present

## 2016-02-21 DIAGNOSIS — K219 Gastro-esophageal reflux disease without esophagitis: Secondary | ICD-10-CM | POA: Diagnosis not present

## 2016-02-21 DIAGNOSIS — Z96641 Presence of right artificial hip joint: Secondary | ICD-10-CM | POA: Diagnosis not present

## 2016-02-21 NOTE — Telephone Encounter (Signed)
Error

## 2016-02-21 NOTE — Telephone Encounter (Signed)
Margarita Grizzle said that most of pts meds were changed while at [facility] and she is not sure what pt should be taking; while reviewing the med list with Margarita Grizzle the nurse with [facility] came to pts home and Margarita Grizzle will review with home health nurse and then if she still has questions Margarita Grizzle or the nurse will cb.

## 2016-02-21 NOTE — Telephone Encounter (Signed)
Ms Cari [facility]away [facility] signed ) left v/m requesting cb about pts medications; pt was discharged from [facility] on 02/20/16; pt has f/u appt with Dr Darnell Level on 03/04/16. Left v/m requesting cb.

## 2016-02-22 DIAGNOSIS — B951 Streptococcus, group B, as the cause of diseases classified elsewhere: Secondary | ICD-10-CM | POA: Diagnosis not present

## 2016-02-22 DIAGNOSIS — I509 Heart failure, unspecified: Secondary | ICD-10-CM | POA: Diagnosis not present

## 2016-02-22 DIAGNOSIS — I339 Acute and subacute endocarditis, unspecified: Secondary | ICD-10-CM | POA: Diagnosis not present

## 2016-02-22 DIAGNOSIS — E1140 Type 2 diabetes mellitus with diabetic neuropathy, unspecified: Secondary | ICD-10-CM | POA: Diagnosis not present

## 2016-02-22 DIAGNOSIS — J441 Chronic obstructive pulmonary disease with (acute) exacerbation: Secondary | ICD-10-CM | POA: Diagnosis not present

## 2016-02-22 DIAGNOSIS — I130 Hypertensive heart and chronic kidney disease with heart failure and stage 1 through stage 4 chronic kidney disease, or unspecified chronic kidney disease: Secondary | ICD-10-CM | POA: Diagnosis not present

## 2016-02-22 NOTE — Telephone Encounter (Signed)
plz continue meds as recommended by [facility] nurse. plz touch base today for transitional care phone call - see if any other concerns prior to scheduled appt 03/04/2016. Keep f/u appt.  Make sure taking only atorvastatin, not simvastatin.

## 2016-02-22 NOTE — Telephone Encounter (Addendum)
Spoke with daughter, transitional care phone call performed.  Now at home. PT involved, strength improving.

## 2016-02-22 NOTE — Telephone Encounter (Signed)
Eric [name] left v/m wanting to see if Dr Darnell Level has reviewed the med list faxed by [facility] nurse; Eric [name] was advised by [facility] nurse since pts BP is low will hold some of BP meds. Eric [name] will hold med as instructed and request cb after Dr Darnell Level reviews med list.

## 2016-02-22 NOTE — Telephone Encounter (Signed)
Laurie left v/m; the [facility] nurse is going to fax a med list to Dr Darnell Level that pt is presently taking and request Dr Darnell Level to review and verify those meds are OK with Dr Darnell Level for pt to take. Margarita Grizzle request cb.

## 2016-02-23 DIAGNOSIS — I339 Acute and subacute endocarditis, unspecified: Secondary | ICD-10-CM | POA: Diagnosis not present

## 2016-02-23 DIAGNOSIS — I130 Hypertensive heart and chronic kidney disease with heart failure and stage 1 through stage 4 chronic kidney disease, or unspecified chronic kidney disease: Secondary | ICD-10-CM | POA: Diagnosis not present

## 2016-02-23 DIAGNOSIS — B951 Streptococcus, group B, as the cause of diseases classified elsewhere: Secondary | ICD-10-CM | POA: Diagnosis not present

## 2016-02-23 DIAGNOSIS — Z96649 Presence of unspecified artificial hip joint: Secondary | ICD-10-CM | POA: Diagnosis not present

## 2016-02-23 DIAGNOSIS — I38 Endocarditis, valve unspecified: Secondary | ICD-10-CM | POA: Diagnosis not present

## 2016-02-23 DIAGNOSIS — I509 Heart failure, unspecified: Secondary | ICD-10-CM | POA: Diagnosis not present

## 2016-02-23 DIAGNOSIS — E1140 Type 2 diabetes mellitus with diabetic neuropathy, unspecified: Secondary | ICD-10-CM | POA: Diagnosis not present

## 2016-02-23 DIAGNOSIS — J441 Chronic obstructive pulmonary disease with (acute) exacerbation: Secondary | ICD-10-CM | POA: Diagnosis not present

## 2016-02-23 LAB — BASIC METABOLIC PANEL
BUN: 23 mg/dL — AB (ref 4–21)
Creat: 1.52

## 2016-02-25 DIAGNOSIS — B951 Streptococcus, group B, as the cause of diseases classified elsewhere: Secondary | ICD-10-CM | POA: Diagnosis not present

## 2016-02-25 DIAGNOSIS — J441 Chronic obstructive pulmonary disease with (acute) exacerbation: Secondary | ICD-10-CM | POA: Diagnosis not present

## 2016-02-25 DIAGNOSIS — I509 Heart failure, unspecified: Secondary | ICD-10-CM | POA: Diagnosis not present

## 2016-02-25 DIAGNOSIS — E1140 Type 2 diabetes mellitus with diabetic neuropathy, unspecified: Secondary | ICD-10-CM | POA: Diagnosis not present

## 2016-02-25 DIAGNOSIS — I130 Hypertensive heart and chronic kidney disease with heart failure and stage 1 through stage 4 chronic kidney disease, or unspecified chronic kidney disease: Secondary | ICD-10-CM | POA: Diagnosis not present

## 2016-02-25 DIAGNOSIS — I339 Acute and subacute endocarditis, unspecified: Secondary | ICD-10-CM | POA: Diagnosis not present

## 2016-02-26 ENCOUNTER — Encounter: Payer: Self-pay | Admitting: Family Medicine

## 2016-02-26 ENCOUNTER — Ambulatory Visit (INDEPENDENT_AMBULATORY_CARE_PROVIDER_SITE_OTHER): Payer: Medicare Other | Admitting: Family Medicine

## 2016-02-26 VITALS — BP 114/60 | HR 72 | Temp 97.6°F | Wt 187.0 lb

## 2016-02-26 DIAGNOSIS — N183 Chronic kidney disease, stage 3 unspecified: Secondary | ICD-10-CM

## 2016-02-26 DIAGNOSIS — E7800 Pure hypercholesterolemia, unspecified: Secondary | ICD-10-CM | POA: Diagnosis not present

## 2016-02-26 DIAGNOSIS — R0603 Acute respiratory distress: Secondary | ICD-10-CM

## 2016-02-26 DIAGNOSIS — I10 Essential (primary) hypertension: Secondary | ICD-10-CM

## 2016-02-26 DIAGNOSIS — I679 Cerebrovascular disease, unspecified: Secondary | ICD-10-CM

## 2016-02-26 DIAGNOSIS — E1140 Type 2 diabetes mellitus with diabetic neuropathy, unspecified: Secondary | ICD-10-CM

## 2016-02-26 DIAGNOSIS — N189 Chronic kidney disease, unspecified: Secondary | ICD-10-CM

## 2016-02-26 DIAGNOSIS — I480 Paroxysmal atrial fibrillation: Secondary | ICD-10-CM

## 2016-02-26 DIAGNOSIS — I339 Acute and subacute endocarditis, unspecified: Secondary | ICD-10-CM

## 2016-02-26 DIAGNOSIS — I6529 Occlusion and stenosis of unspecified carotid artery: Secondary | ICD-10-CM

## 2016-02-26 DIAGNOSIS — R0600 Dyspnea, unspecified: Secondary | ICD-10-CM

## 2016-02-26 DIAGNOSIS — I420 Dilated cardiomyopathy: Secondary | ICD-10-CM

## 2016-02-26 DIAGNOSIS — I639 Cerebral infarction, unspecified: Secondary | ICD-10-CM

## 2016-02-26 DIAGNOSIS — N179 Acute kidney failure, unspecified: Secondary | ICD-10-CM

## 2016-02-26 MED ORDER — FERROUS SULFATE 325 (65 FE) MG PO TABS: 325.0000 mg | ORAL_TABLET | Freq: Every day | ORAL | Status: DC

## 2016-02-26 MED ORDER — FUROSEMIDE 20 MG PO TABS: 40.0000 mg | ORAL_TABLET | Freq: Every day | ORAL | Status: DC

## 2016-02-26 MED ORDER — POTASSIUM CHLORIDE ER 10 MEQ PO CPCR: 10.0000 meq | ORAL_CAPSULE | Freq: Every day | ORAL | Status: DC

## 2016-02-26 NOTE — Progress Notes (Signed)
BP 114/60 mmHg  Pulse 72  Temp(Src) 97.6 F (36.4 C) (Oral)  Wt 187 lb (84.823 kg)  SpO2 98%   CC: hosp f/u visit  Subjective:    Patient ID: Eric [name], male    DOB: [birth year], 82 y.o.   MRN: [id number]  HPI: TAISEI SYZDEK is a 82 y.o. male presenting on 02/26/2016 for Follow-up   Presents with daughter Margarita Grizzle. Lives with son.  Complicated hospitalization presented to ER with fall and found to have encephalopathy, SIRS, respiratory failure from COPD exaerbation, acute on chronic kidney disease, acute dCHF exacerbation, and afib with RVR. Also found to have Hospitalization further complicated by acute likely embolic stroke with bacteremia with strep B agalactiae and hospital acquired pneumonia. Found to have mitral valve vegetation 01/15/2016 (in known h/o aortic bioprosthesis). Had multiple med changes - records reviewed. On vancomycin IV (ampicillin allergy) through RIJ central line line. Last abx dose will be tomorrow. ID, neurology, cardiology were involved. Has HH PT/OT/RN. Still awaiting nurse aide. ACEI stopped due to acute on chronic kidney disease.   CHADSvasc score = 7, suggested re evaluate anticoagulant eligibility when abx course complete and fully resolved from endocarditis.   Rpt blcx 2 no growth to date.  He's been home since 02/20/2016. Staying fatigued and weak. Mild dyspnea - on O2 PRN but doesn't find he needs this.   Denies fevers/chills, chest pain, palpitations.   DM - complaint with lantus 30u nightly, glipizide 10mg  with breakfast. Not taking novolog 4u TID AC since he's been back home.   F/u with cards scheduled for 02/28/2016. Pt had labs done 02/23/2016 - will request copy.  He did have ER visit with emesis and fever on 01/30/2016, s/p eval and normal hemoccult x3, found to have progressive anemia to 7. This was monitored at nursing home and stabilized.   Admit date: 12/25/2015 Discharge date: 01/17/2016 SNF discharge date: 02/20/2016 SNF f/u phone call:  02/22/2016  Recommendations for Outpatient Follow-up:  1. Will need treatment of bacterial endocarditis with ampicillin until 02/18/2016-CRP, CBC plus differential and chem 12 to be reported to Dr. Linus Salmons at [facility] for infectious disease weekly as per home health protocol 2. The IJ line to be removed once treatment is complete 3. Cardiology to consider repeat TTE after treatment of endocarditis 4. Anticoagulation as follows-aspirin until/6/17 and then repeat blood cultures. If blood cultures remain negative okay to switch aspirin to Eliquis 2.5 twice a day given lowest risk among DOA sees for bleeding.-Skilled nursing facility to arrange both blood culture as well as transition of aspirin to novel oral anticoagulant. 5. Will need reevaluation by cardiology as an outpatient regarding A. fib with RVR, [place] score 7 as well as risk for falls-we will attempt to set up a transitional care visit within 2 weeks 6. Monitor blood sugars note that patient should be on Lantus and sliding scale supplementation-expect that his needs will go down given he was on prednisone for a long period of time during this hospital stay-we did taper him down to 10 mg on 3/29 and would suggest only 3 or 4 more days of this dose and then discontinuation. 7. Recommend repeat carotid Dopplers in the more distant future like in 6 months because of 60-79% right ICA stenosis-nursing home physician/PCP to arrange on discharge.Marland Kitchen  Discharge Diagnoses:  Principal Problem:  Acute respiratory distress ([place]) Active Problems:  HYPERCHOLESTEROLEMIA  Bronchitis  Diabetes mellitus with complication ([name]  S/P CABG (coronary artery bypass graft)  S/P AVR (  aortic valve replacement)  Atrial fibrillation with RVR ([name]  Multifocal atrial tachycardia ([name]  Sepsis ([name]  COPD exacerbation ([name]  Metabolic acidosis  Dilated cardiomyopathy ([name]  Pulmonary hypertension ([name]  CAD in native artery  H/O aortic valve  replacement  Uncontrolled type 2 diabetes mellitus with complication ([name]  Hyponatremia  Hypotension  Acute renal failure superimposed on stage 3 chronic kidney disease ([name]  Delirium  Acute renal failure ([name]  Cerebral thrombosis with cerebral infarction  Pressure ulcer  Slurred speech  Insulin dependent diabetes mellitus ([place])  Bacteremia  CVA (cerebral infarction)  Acute endocarditis  Discharge Condition: Improved  Diet recommendation: Dysphagia 3 diet  Relevant past medical, surgical, family and social history reviewed and updated as indicated. Interim medical history since our last visit reviewed. Allergies and medications reviewed and updated. Current Outpatient Prescriptions on File Prior to Visit  Medication Sig  . Amino Acids-Protein Hydrolys (FEEDING SUPPLEMENT, PRO-STAT SUGAR FREE 64,) LIQD Take 30 mLs by mouth 2 (two) times daily with a meal.  . aspirin 81 MG chewable tablet Chew 81 mg by mouth daily.  Marland Kitchen atorvastatin (LIPITOR) 20 MG tablet Take 1 tablet (20 mg total) by mouth daily at 6 PM.  . insulin glargine (LANTUS) 100 UNIT/ML injection Check CBG. Inject 30 units sub q daily at bedtime  . nitroGLYCERIN (NITROSTAT) 0.4 MG SL tablet Place 0.4 mg under the tongue every 5 (five) minutes as needed. Call MD/NP if pain unrelieved.  . OXYGEN Inhale 3 L into the lungs.  Marland Kitchen UNABLE TO FIND Med Name: Heparin 5 mL IV flush  . vancomycin 1,000 mg in sodium chloride 0.9 % 250 mL Give 1000 mg iv to be given every 48 hours x 5 doses. Administer over 2 hours.   No current facility-administered medications on file prior to visit.    Review of Systems Per HPI unless specifically indicated in ROS section     Objective:    BP 114/60 mmHg  Pulse 72  Temp(Src) 97.6 F (36.4 C) (Oral)  Wt 187 lb (84.823 kg)  SpO2 98%  Wt Readings from Last 3 Encounters:  02/26/16 187 lb (84.823 kg)  02/19/16 182 lb (82.555 kg)  02/15/16 179 lb 6.4 oz (81.375 kg)    Physical  Exam  Constitutional: He appears well-developed and well-nourished. No distress.  HENT:  Mouth/Throat: Oropharynx is clear and moist. No oropharyngeal exudate.  Cardiovascular: Normal rate and intact distal pulses.  An irregularly irregular rhythm present.  No murmur heard. Pulmonary/Chest: Effort normal and breath sounds normal. No respiratory distress. He has no wheezes. He has no rales.  Musculoskeletal: He exhibits edema (tr).  Skin: Skin is warm and dry. No rash noted.  Psychiatric: He has a normal mood and affect.  Nursing note and vitals reviewed.  Results for orders placed or performed in visit on 02/19/16  Echocardiogram  Result Value Ref Range   Weight 2912 oz   Height 69.000 in   BP 106/64 mmHg      Assessment & Plan:   Problem List Items Addressed This Visit    Type 2 diabetes, controlled, with neuropathy ([place])    Stable readings - continue current regimen. Now off novolog sliding scale.  Lab Results  Component Value Date   HGBA1C 7.1* 12/25/2015        Relevant Medications   glipiZIDE (GLUCOTROL) 10 MG tablet   HYPERCHOLESTEROLEMIA    Continue lipitor daily.      Relevant Medications   furosemide (LASIX) 20 MG tablet  diltiazem (CARDIZEM) 120 MG tablet   Cerebrovascular disease    Continue aspirin/statin      Relevant Medications   furosemide (LASIX) 20 MG tablet   diltiazem (CARDIZEM) 120 MG tablet   CKD (chronic kidney disease) stage 3, GFR 30-59 ml/min    S/p acute on chronic kidney failure, recent labs per patient ok, awaiting records. Continue good water intake.      Paroxysmal atrial fibrillation ([name]    Remains in afib. Currently off anticoagulant, only taking aspirin. Has f/u tomorrow with cards - likely discuss NOAC then.      Relevant Medications   furosemide (LASIX) 20 MG tablet   diltiazem (CARDIZEM) 120 MG tablet   Dilated cardiomyopathy ([name]   Relevant Medications   furosemide (LASIX) 20 MG tablet   diltiazem (CARDIZEM) 120  MG tablet   Essential hypertension    bp recently low - now off several antihypertensives, currently only on diltiazem and lasix 40mg  daily - continue.      Relevant Medications   furosemide (LASIX) 20 MG tablet   diltiazem (CARDIZEM) 120 MG tablet   Acute on chronic renal failure ([place])    Will review Saturday's labs when they arrive.      CVA (cerebral infarction)    Presumed embolic from Afib. Difficult situation with recent endocarditis -- currently avoiding anticoagulant.       Acute endocarditis - Primary    Appreciate cards, ID care of patient. Completing vancomycin for 6 wks then removal of central line. a      Relevant Medications   furosemide (LASIX) 20 MG tablet   diltiazem (CARDIZEM) 120 MG tablet   Carotid stenosis    Will need updated at f/u visit      Relevant Medications   furosemide (LASIX) 20 MG tablet   diltiazem (CARDIZEM) 120 MG tablet   RESOLVED: Acute respiratory distress ([name]    Now breathing back to baseline          Follow up plan: Return in about 4 weeks (around 03/25/2016), or as needed, for follow up visit.  [name], MD

## 2016-02-26 NOTE — Patient Instructions (Addendum)
Ask for copy of labwork from Saturday to be sent to me - fax [phone] You are doing well. Continue current medicines. Return as needed or in 1 month for follow up. Keep other doctor appointments.  Keep working hard with physical therapy.

## 2016-02-26 NOTE — Progress Notes (Signed)
Pre visit review using our clinic review tool, if applicable. No additional management support is needed unless otherwise documented below in the visit note. 

## 2016-02-27 DIAGNOSIS — I339 Acute and subacute endocarditis, unspecified: Secondary | ICD-10-CM | POA: Diagnosis not present

## 2016-02-27 DIAGNOSIS — J441 Chronic obstructive pulmonary disease with (acute) exacerbation: Secondary | ICD-10-CM | POA: Diagnosis not present

## 2016-02-27 DIAGNOSIS — B951 Streptococcus, group B, as the cause of diseases classified elsewhere: Secondary | ICD-10-CM | POA: Diagnosis not present

## 2016-02-27 DIAGNOSIS — I509 Heart failure, unspecified: Secondary | ICD-10-CM | POA: Diagnosis not present

## 2016-02-27 DIAGNOSIS — I130 Hypertensive heart and chronic kidney disease with heart failure and stage 1 through stage 4 chronic kidney disease, or unspecified chronic kidney disease: Secondary | ICD-10-CM | POA: Diagnosis not present

## 2016-02-27 DIAGNOSIS — E1140 Type 2 diabetes mellitus with diabetic neuropathy, unspecified: Secondary | ICD-10-CM | POA: Diagnosis not present

## 2016-02-27 LAB — CBC
HGB: 8.6 g/dL
PLATELET COUNT: 156
WBC: 8.7

## 2016-02-28 ENCOUNTER — Ambulatory Visit (INDEPENDENT_AMBULATORY_CARE_PROVIDER_SITE_OTHER): Payer: Medicare Other | Admitting: Physician Assistant

## 2016-02-28 ENCOUNTER — Telehealth: Payer: Self-pay | Admitting: Family Medicine

## 2016-02-28 ENCOUNTER — Encounter: Payer: Self-pay | Admitting: Family Medicine

## 2016-02-28 ENCOUNTER — Encounter: Payer: Self-pay | Admitting: Physician Assistant

## 2016-02-28 ENCOUNTER — Other Ambulatory Visit: Payer: Self-pay | Admitting: *Deleted

## 2016-02-28 ENCOUNTER — Encounter: Payer: Self-pay | Admitting: *Deleted

## 2016-02-28 VITALS — BP 92/50 | HR 80 | Ht 69.0 in | Wt 188.0 lb

## 2016-02-28 DIAGNOSIS — E1140 Type 2 diabetes mellitus with diabetic neuropathy, unspecified: Secondary | ICD-10-CM | POA: Diagnosis not present

## 2016-02-28 DIAGNOSIS — I2510 Atherosclerotic heart disease of native coronary artery without angina pectoris: Secondary | ICD-10-CM

## 2016-02-28 DIAGNOSIS — I509 Heart failure, unspecified: Secondary | ICD-10-CM | POA: Diagnosis not present

## 2016-02-28 DIAGNOSIS — Z952 Presence of prosthetic heart valve: Secondary | ICD-10-CM

## 2016-02-28 DIAGNOSIS — I339 Acute and subacute endocarditis, unspecified: Secondary | ICD-10-CM | POA: Diagnosis not present

## 2016-02-28 DIAGNOSIS — E7800 Pure hypercholesterolemia, unspecified: Secondary | ICD-10-CM

## 2016-02-28 DIAGNOSIS — I6529 Occlusion and stenosis of unspecified carotid artery: Secondary | ICD-10-CM | POA: Insufficient documentation

## 2016-02-28 DIAGNOSIS — I130 Hypertensive heart and chronic kidney disease with heart failure and stage 1 through stage 4 chronic kidney disease, or unspecified chronic kidney disease: Secondary | ICD-10-CM | POA: Diagnosis not present

## 2016-02-28 DIAGNOSIS — I10 Essential (primary) hypertension: Secondary | ICD-10-CM

## 2016-02-28 DIAGNOSIS — I480 Paroxysmal atrial fibrillation: Secondary | ICD-10-CM | POA: Diagnosis not present

## 2016-02-28 DIAGNOSIS — J441 Chronic obstructive pulmonary disease with (acute) exacerbation: Secondary | ICD-10-CM | POA: Diagnosis not present

## 2016-02-28 DIAGNOSIS — D509 Iron deficiency anemia, unspecified: Secondary | ICD-10-CM | POA: Insufficient documentation

## 2016-02-28 DIAGNOSIS — Z954 Presence of other heart-valve replacement: Secondary | ICD-10-CM

## 2016-02-28 DIAGNOSIS — I6330 Cerebral infarction due to thrombosis of unspecified cerebral artery: Secondary | ICD-10-CM

## 2016-02-28 DIAGNOSIS — I330 Acute and subacute infective endocarditis: Secondary | ICD-10-CM

## 2016-02-28 DIAGNOSIS — Z951 Presence of aortocoronary bypass graft: Secondary | ICD-10-CM

## 2016-02-28 DIAGNOSIS — D649 Anemia, unspecified: Secondary | ICD-10-CM

## 2016-02-28 DIAGNOSIS — N183 Chronic kidney disease, stage 3 unspecified: Secondary | ICD-10-CM

## 2016-02-28 DIAGNOSIS — B951 Streptococcus, group B, as the cause of diseases classified elsewhere: Secondary | ICD-10-CM | POA: Diagnosis not present

## 2016-02-28 MED ORDER — POTASSIUM CHLORIDE ER 10 MEQ PO TBCR: 10.0000 meq | EXTENDED_RELEASE_TABLET | Freq: Every day | ORAL | Status: DC

## 2016-02-28 NOTE — Assessment & Plan Note (Signed)
Will need updated at f/u visit

## 2016-02-28 NOTE — Assessment & Plan Note (Addendum)
Stable readings - continue current regimen. Now off novolog sliding scale.  Lab Results  Component Value Date   HGBA1C 7.1* 12/25/2015

## 2016-02-28 NOTE — Patient Instructions (Addendum)
Medication Instructions:   Your physician recommends that you continue on your current medications as directed. Please refer to the Current Medication list given to you today.   If you need a refill on your cardiac medications before your next appointment, please call your pharmacy.  Labwork: NONE ORDER TODAY    Testing/Procedures: NONE ORDER TODAY    Follow-Up:  WITH DR CRENSHAW IN ONE TO TWO MONTHS   AN FOLLOW UP WITH INFECTIOUS DISEASE CLINIC WITH DR COMER    Any Other Special Instructions Will Be Listed Below (If Applicable).

## 2016-02-28 NOTE — Assessment & Plan Note (Signed)
Will review Saturday's labs when they arrive.

## 2016-02-28 NOTE — Telephone Encounter (Signed)
Message sent to Darlina Guys with [facility] to have [place] draw labs at their next visit.

## 2016-02-28 NOTE — Progress Notes (Signed)
Patient ID: Eric [name], male   DOB: [birth year], 82 y.o.   MRN: [id number]    Date:  02/28/2016   ID:  Eric [name], DOB [birth year], MRN [id number]  PCP:  Eric Bush, MD  Primary Cardiologist:  Eric [name]  Chief Complaint  Patient presents with  . Follow-up    echo results     History of Present Illness: Eric [name] is a 82 y.o. male w/ PMH of CAD (s/p CABG 2010), aortic stenosis (s/p AVR 2010), Type 2 DM, HTN, and HLD admitted on 12/25/2015 for worsening cough and wheezing. Met SIRS criteria, diagnosed with PNA, positive for strep bacteremia. He was diagnosed with acute endocarditis. We were consulted for new-onset atrial fibrillation w/ RVR. CHADSVASC score 7. Pt had CVA 01/06/16. TEE 01/15/16 with MV vegetation. His heart rate was controlled on Cardizem 120 daily and bisoprolol 2.5 daily. He is not a surgical candidate for MVR. He had a TEE on 01/15/2016 revealing a 2 cm vegetation attached to the posterior mitral valve leaflet was moderate mitral valve regurgitation. No thrombus in the atrial cavity or appendage had a normal ejection fraction. In no vegetation on the bioprosthetic aortic valve.  Patient is home from rehabilitation at [facility]. His daughter was with him today.  Eric [name] reports doing well.  He is in a wheel chair and is working with PT at home.  [facility] drew blood yesterday.  His last dose of antibiotics was yesterday.  His weight is up 8 pounds since April 28 however, he is not complaining of orthopnea, PND or lower extremity edema.  His blood pressures been soft as it is today. His Lasix was held by his daughter Saturday and Sunday and Cardizem was cut in half. As of Monday he was back to full doses.  His O2 levels and in the 90s at home.   He had a new echocardiogram on 02/19/2016 revealing ejection fraction was 5055% with normal wall motion. Calcified mass in the posterior mitral valve leaflet unchanged. Mild MR  The patient currently  denies nausea, vomiting, fever, chest pain, shortness of breath, orthopnea, dizziness, PND, cough, congestion, abdominal pain, hematochezia, melena, lower extremity edema, claudication.  Wt Readings from Last 3 Encounters:  02/28/16 188 lb (85.276 kg)  02/26/16 187 lb (84.823 kg)  02/19/16 182 lb (82.555 kg)     Past Medical History  Diagnosis Date  . Cerebrovascular disease, unspecified   . Coronary atherosclerosis of unspecified type of vessel, native or graft     s/p CABG 2010  . Esophageal reflux   . Pure hypercholesterolemia   . Essential hypertension, benign   . Type II or unspecified type diabetes mellitus with neurological manifestations, not stated as uncontrolled   . Bilateral hydrocele 3/11    [facility]  . History of aortic stenosis     s/p valve replacment 2010  . Cervicalgia   . Foraminal stenosis of cervical region     bilateral-C4-C5, C5-C6  . Colon polyp   . Shingles 07/27/2012  . 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 aspirin 81 MG chewable tablet Chew 81 mg by mouth daily.    Marland Kitchen atorvastatin (LIPITOR) 20 MG tablet Take 1 tablet (20 mg total) by mouth daily at 6 PM. 30 tablet 0  . diltiazem (CARDIZEM) 120 MG tablet Take 1 tablet (120 mg total) by mouth 2 (  two) times daily. Hold if HR < 60/min SBP < 110    . FAMOTIDINE PO Take 1 tablet by mouth daily with breakfast.    . ferrous sulfate 325 (65 FE) MG tablet Take 1 tablet (325 mg total) by mouth daily. 90 tablet 1  . furosemide (LASIX) 20 MG tablet Take 2 tablets (40 mg total) by mouth daily. Hold for SBP <110 180 tablet 1  . glipiZIDE (GLUCOTROL) 10 MG tablet Take 10 mg by mouth daily before breakfast.    . insulin glargine (LANTUS) 100 UNIT/ML injection Check CBG. Inject 30 units sub q daily at bedtime    . nitroGLYCERIN (NITROSTAT) 0.4 MG SL tablet Place 0.4 mg under the tongue  every 5 (five) minutes as needed. Call MD/NP if pain unrelieved.    . OXYGEN Inhale 3 L into the lungs.    . potassium chloride (MICRO-K) 10 MEQ CR capsule Take 1 capsule (10 mEq total) by mouth daily. 90 capsule 1   No current facility-administered medications for this visit.    Allergies:    Allergies  Allergen Reactions  . Ampicillin Rash    Skin rash    Social History:  The patient  reports that he quit smoking about 22 years ago. His smoking use included Cigarettes. He has a 50 pack-year smoking history. He has never used smokeless tobacco. He reports that he drinks alcohol. He reports that he does not use illicit drugs.   Family history:   Family History  Problem Relation Age of Onset  . Heart attack Father 32  . Breast cancer Mother 64  . Brain cancer Sister   . Prostate cancer Brother   . Diabetes Neg Hx   . Stroke Neg Hx     ROS:  Please see the history of present illness.  All other systems reviewed and negative.   PHYSICAL EXAM: VS:  BP 92/50 mmHg  Pulse 80  Ht 5' 9"  (1.753 m)  Wt 188 lb (85.276 kg)  BMI 27.75 kg/m2 Well nourished, well developed, in no acute distress HEENT: Pupils are equal round react to light accommodation extraocular movements are intact.  Neck: no JVDNo cervical lymphadenopathy. Cardiac: Regular rate and rhythm with 1/6 systolic MM Lungs:  Mild basilar rales bilaterally, no wheezing, rhonchi or rales Abd: soft, nontender, positive bowel sounds all quadrants, no hepatosplenomegaly Ext: trace lower extremity edema.  2+ radial and dorsalis pedis pulses. Skin: warm and dry Neuro:  Grossly normal    ASSESSMENT AND PLAN:  Problem List Items Addressed This Visit    S/P CABG (coronary artery bypass graft)   S/P AVR (aortic valve replacement)   Paroxysmal atrial fibrillation ([name] - Primary   HYPERCHOLESTEROLEMIA   Essential hypertension   Coronary atherosclerosis      Eric [name] appears to be doing well. He is in good spirits,  smiling and sitting in a wheelchair. His last dose of IV antibiotics was yesterday. [facility] 2 labs yesterday and we will try and get the results. He's had some hypotension with systolic pressures in the 80s. Saturday and Sunday his daughter held his Lasix and Cardizem. He is back to full doses. His blood pressure is still on the low side.  His weight is up 8 pounds he does have some rales on exam so hopefully the continuation of Lasix will help. They will continue to weigh daily and watch his sodium intake.  We'll continue to monitor blood pressure is below 756 systolic to hold Lasix and cut  Cardizem and half. He appears to be sinus rhythm on exam. No complaints of angina. He is scheduled to see neurology in June.

## 2016-02-28 NOTE — Assessment & Plan Note (Signed)
Continue aspirin/statin. 

## 2016-02-28 NOTE — Assessment & Plan Note (Addendum)
bp recently low - now off several antihypertensives, currently only on diltiazem and lasix 40mg  daily - continue.

## 2016-02-28 NOTE — Assessment & Plan Note (Addendum)
S/p acute on chronic kidney failure, recent labs per patient ok, awaiting records. Continue good water intake.

## 2016-02-28 NOTE — Assessment & Plan Note (Signed)
Now breathing back to baseline

## 2016-02-28 NOTE — Assessment & Plan Note (Signed)
Continue lipitor daily  ?

## 2016-02-28 NOTE — Assessment & Plan Note (Addendum)
Remains in afib. Currently off anticoagulant, only taking aspirin. Has f/u tomorrow with cards - likely discuss NOAC then.

## 2016-02-28 NOTE — Assessment & Plan Note (Signed)
Presumed embolic from Afib. Difficult situation with recent endocarditis -- currently avoiding anticoagulant.

## 2016-02-28 NOTE — Telephone Encounter (Signed)
Received labs from 02/23/2016 - only vanc trough and Cr checked. Lavender top tube not collected so CBC not run.  Cr 1.52 (stable to slightly improved). plz notify patient - I'd like him to return here for CBC, renal panel (ordered) or have it drawn by [facility] at home. plz see how we can get this set up.

## 2016-02-28 NOTE — Assessment & Plan Note (Signed)
Appreciate cards, ID care of patient. Completing vancomycin for 6 wks then removal of central line. a

## 2016-02-29 ENCOUNTER — Other Ambulatory Visit (HOSPITAL_COMMUNITY): Payer: Medicare Other

## 2016-02-29 DIAGNOSIS — I509 Heart failure, unspecified: Secondary | ICD-10-CM | POA: Diagnosis not present

## 2016-02-29 DIAGNOSIS — B951 Streptococcus, group B, as the cause of diseases classified elsewhere: Secondary | ICD-10-CM | POA: Diagnosis not present

## 2016-02-29 DIAGNOSIS — I130 Hypertensive heart and chronic kidney disease with heart failure and stage 1 through stage 4 chronic kidney disease, or unspecified chronic kidney disease: Secondary | ICD-10-CM | POA: Diagnosis not present

## 2016-02-29 DIAGNOSIS — I339 Acute and subacute endocarditis, unspecified: Secondary | ICD-10-CM | POA: Diagnosis not present

## 2016-02-29 DIAGNOSIS — E1140 Type 2 diabetes mellitus with diabetic neuropathy, unspecified: Secondary | ICD-10-CM | POA: Diagnosis not present

## 2016-02-29 DIAGNOSIS — J441 Chronic obstructive pulmonary disease with (acute) exacerbation: Secondary | ICD-10-CM | POA: Diagnosis not present

## 2016-02-29 NOTE — Addendum Note (Signed)
Addended by: [name] on: 02/29/2016 07:55 AM   Modules accepted: Orders

## 2016-02-29 NOTE — Telephone Encounter (Addendum)
Actually, received latest CBC drawn 02/27/2016: Hgb 8.6 (<--9.1<--9.6). Ok to defer next lab draw (CBC, renal panel) until late next week Has he noted any bleeding from stool or any more vomiting? rec iFOB (ordered). Did he have blood transfusion at [facility]? May need to refer to GI if ongoing blood loss.

## 2016-02-29 NOTE — Telephone Encounter (Signed)
Spoke with patient's daughter. No blood in stool or vomiting. No blood transfusion at [facility]. Stool kit mailed. [facility] will draw repeat labs next week. Will stand by for possible GI referral.

## 2016-03-03 ENCOUNTER — Telehealth: Payer: Self-pay | Admitting: *Deleted

## 2016-03-03 DIAGNOSIS — J441 Chronic obstructive pulmonary disease with (acute) exacerbation: Secondary | ICD-10-CM | POA: Diagnosis not present

## 2016-03-03 DIAGNOSIS — I130 Hypertensive heart and chronic kidney disease with heart failure and stage 1 through stage 4 chronic kidney disease, or unspecified chronic kidney disease: Secondary | ICD-10-CM | POA: Diagnosis not present

## 2016-03-03 DIAGNOSIS — B951 Streptococcus, group B, as the cause of diseases classified elsewhere: Secondary | ICD-10-CM | POA: Diagnosis not present

## 2016-03-03 DIAGNOSIS — E1140 Type 2 diabetes mellitus with diabetic neuropathy, unspecified: Secondary | ICD-10-CM | POA: Diagnosis not present

## 2016-03-03 DIAGNOSIS — I509 Heart failure, unspecified: Secondary | ICD-10-CM | POA: Diagnosis not present

## 2016-03-03 DIAGNOSIS — I339 Acute and subacute endocarditis, unspecified: Secondary | ICD-10-CM | POA: Diagnosis not present

## 2016-03-03 NOTE — Telephone Encounter (Signed)
Patient with double lumen IJ access completed IV antibiotics 5/10 per [facility], is now discharged from their services.  Patient missed follow up 5/2, is not scheduled for follow up until 6/13.  Per daughter Margarita Grizzle, patient experiencing discomfort with IJ, is asking to have it pulled out.  Please advise. [facility] unable to go to the home to pull the line. Landis Gandy, RN

## 2016-03-03 NOTE — Telephone Encounter (Signed)
Yes, ok to pull it. Looks like the echo was repeated and vegetation resolved. thanks

## 2016-03-04 ENCOUNTER — Telehealth: Payer: Self-pay | Admitting: *Deleted

## 2016-03-04 ENCOUNTER — Telehealth: Payer: Self-pay

## 2016-03-04 ENCOUNTER — Ambulatory Visit: Payer: Medicare Other | Admitting: Family Medicine

## 2016-03-04 DIAGNOSIS — I330 Acute and subacute infective endocarditis: Secondary | ICD-10-CM

## 2016-03-04 NOTE — Telephone Encounter (Signed)
Agree. thanks

## 2016-03-04 NOTE — Telephone Encounter (Signed)
Incorrect contact number. No other number listed. Unable to return call.

## 2016-03-04 NOTE — Telephone Encounter (Signed)
[facility] left message at Triage. They have an availability issue with the "micro-K 10 Cr. Extend Cap MEQ" Rx that was sent electronically. Pharmacy request call back to discuss this and given reference # [id number]

## 2016-03-04 NOTE — Telephone Encounter (Signed)
Order placed to remove double lumen IJ per Dr. Linus Salmons. Scheduled for 5/17 at 12:00.  Pt to arrive 11:45 at [facility], will need to register at admitting.  He will then be brought to 1st floor radiology.  Patient's daughter made aware, accepted and verbalized understanding. Patient will follow up at [place] on 6/13. Landis Gandy, RN

## 2016-03-04 NOTE — Telephone Encounter (Signed)
Done. thanks

## 2016-03-04 NOTE — Telephone Encounter (Signed)
Eric [name] OT with [facility] left v/m requesting extension of home health OT 1 x a week for 1 week. Pt is going to miss OT visit this week due to doctors appt and being too fatigued to do OT visit same day as appt.

## 2016-03-05 ENCOUNTER — Encounter: Payer: Self-pay | Admitting: Family Medicine

## 2016-03-05 ENCOUNTER — Ambulatory Visit (HOSPITAL_COMMUNITY)
Admission: RE | Admit: 2016-03-05 | Discharge: 2016-03-05 | Disposition: A | Discharge status: Home / Self Care | Payer: Medicare Other | Source: Ambulatory Visit | Attending: Internal Medicine | Admitting: Internal Medicine

## 2016-03-05 DIAGNOSIS — I330 Acute and subacute infective endocarditis: Secondary | ICD-10-CM | POA: Insufficient documentation

## 2016-03-05 DIAGNOSIS — Z452 Encounter for adjustment and management of vascular access device: Secondary | ICD-10-CM | POA: Diagnosis not present

## 2016-03-05 MED ORDER — CHLORHEXIDINE GLUCONATE 4 % EX LIQD
CUTANEOUS | Status: AC
  Filled 2016-03-05: qty 15

## 2016-03-05 NOTE — Procedures (Signed)
Successful removal of (R) IJ tunneled PICC line No immediate complications  Fleurette Woolbright E

## 2016-03-05 NOTE — Telephone Encounter (Signed)
Spoke with [facility] and advised that Micro K had been cancelled and that K-Dur had been sent it. They looked and confirmed same.

## 2016-03-05 NOTE — Telephone Encounter (Signed)
Message left notifying Izora Gala.

## 2016-03-05 NOTE — Telephone Encounter (Signed)
[facility] called back requesting a call back to discuss the availability issues with med in prev note, please call them back and use reference # listed below,   (correct phone # added to the contact info)

## 2016-03-06 ENCOUNTER — Inpatient Hospital Stay: Payer: Medicare Other | Admitting: Internal Medicine

## 2016-03-06 ENCOUNTER — Telehealth: Payer: Self-pay | Admitting: Family Medicine

## 2016-03-06 DIAGNOSIS — J441 Chronic obstructive pulmonary disease with (acute) exacerbation: Secondary | ICD-10-CM | POA: Diagnosis not present

## 2016-03-06 DIAGNOSIS — I339 Acute and subacute endocarditis, unspecified: Secondary | ICD-10-CM | POA: Diagnosis not present

## 2016-03-06 DIAGNOSIS — E1140 Type 2 diabetes mellitus with diabetic neuropathy, unspecified: Secondary | ICD-10-CM | POA: Diagnosis not present

## 2016-03-06 DIAGNOSIS — I509 Heart failure, unspecified: Secondary | ICD-10-CM | POA: Diagnosis not present

## 2016-03-06 DIAGNOSIS — B951 Streptococcus, group B, as the cause of diseases classified elsewhere: Secondary | ICD-10-CM | POA: Diagnosis not present

## 2016-03-06 DIAGNOSIS — I130 Hypertensive heart and chronic kidney disease with heart failure and stage 1 through stage 4 chronic kidney disease, or unspecified chronic kidney disease: Secondary | ICD-10-CM | POA: Diagnosis not present

## 2016-03-06 LAB — HEMOGLOBIN: HEMOGLOBIN: 9.2 g/dL

## 2016-03-06 MED ORDER — DILTIAZEM HCL 60 MG PO TABS: 60.0000 mg | ORAL_TABLET | Freq: Two times a day (BID) | ORAL | Status: DC

## 2016-03-06 MED ORDER — DILTIAZEM HCL 90 MG PO TABS: 90.0000 mg | ORAL_TABLET | Freq: Two times a day (BID) | ORAL | Status: DC

## 2016-03-06 NOTE — Telephone Encounter (Signed)
Decrease diltiazem to 60 mg twice daily - cut current pills in half if able. I have sent lower dose 1 mo to local pharmacy to see how he tolerates this.  Continue lasix 40mg  daily but hold if sbp remains low <110. Awaiting Hgb.

## 2016-03-06 NOTE — Telephone Encounter (Signed)
Message left advising Eric [name].

## 2016-03-06 NOTE — Telephone Encounter (Signed)
Mendel Ryder, Nurse with [facility] called and would like verbal orders to do a Hemoglobin draw for pt as well as extension on nursing orders for 3 more weeks.  She will be seeing pt today at 12 noon and would like to complete the draw at that time.  Best number to call Mendel Ryder [phone]

## 2016-03-06 NOTE — Telephone Encounter (Signed)
Left message on [place] phone that per CMA KD, Ok verbal order for Hemoglobin Draw and extension for 3 weeks on nursing orders.  [phone]

## 2016-03-06 NOTE — Telephone Encounter (Signed)
OK'd orders and LT gave them to Kirkman with [facility].

## 2016-03-06 NOTE — Telephone Encounter (Signed)
Eric [name] called Pt bp was 100/60 sitting down And stand 90/50  He is symptomatic

## 2016-03-07 DIAGNOSIS — J441 Chronic obstructive pulmonary disease with (acute) exacerbation: Secondary | ICD-10-CM | POA: Diagnosis not present

## 2016-03-07 DIAGNOSIS — I339 Acute and subacute endocarditis, unspecified: Secondary | ICD-10-CM | POA: Diagnosis not present

## 2016-03-07 DIAGNOSIS — B951 Streptococcus, group B, as the cause of diseases classified elsewhere: Secondary | ICD-10-CM | POA: Diagnosis not present

## 2016-03-07 DIAGNOSIS — E1140 Type 2 diabetes mellitus with diabetic neuropathy, unspecified: Secondary | ICD-10-CM | POA: Diagnosis not present

## 2016-03-07 DIAGNOSIS — I130 Hypertensive heart and chronic kidney disease with heart failure and stage 1 through stage 4 chronic kidney disease, or unspecified chronic kidney disease: Secondary | ICD-10-CM | POA: Diagnosis not present

## 2016-03-07 DIAGNOSIS — I509 Heart failure, unspecified: Secondary | ICD-10-CM | POA: Diagnosis not present

## 2016-03-07 NOTE — Telephone Encounter (Signed)
rec change oxygen to PRN exertion. Will reassess at f/u visit in office.  rec lower dilt dose as per below instructions. Use lasix 40mg  PRN leg swelling.

## 2016-03-07 NOTE — Telephone Encounter (Signed)
Stacy notified and verbalized understanding. 

## 2016-03-07 NOTE — Telephone Encounter (Signed)
Stacy PT with [facility] left v/m; when Stacy went to see pt today pt had decided not to take his fluid pill. Today BP 118/60; no fluid retention in the leg. Marzetta Board did not get message on 03/06/16 from [facility]; front office personnel read 03/06/16 message from Dr Darnell Level today. Marzetta Board wants to know if you want pt to follow med changes from 03/06/16 note. Marzetta Board also said pt has not used oxygen in over a week with O2 sats in upper 90s with mobility. Marzetta Board wants to know if oxygen needs to be d/ced or not. Stacy request cb.

## 2016-03-08 NOTE — Telephone Encounter (Addendum)
Hgb stable at 9.2. plz notify pt/daughter. Monitor HR to ensure not >110 with lower dilt dose.

## 2016-03-10 DIAGNOSIS — I339 Acute and subacute endocarditis, unspecified: Secondary | ICD-10-CM | POA: Diagnosis not present

## 2016-03-10 DIAGNOSIS — I509 Heart failure, unspecified: Secondary | ICD-10-CM | POA: Diagnosis not present

## 2016-03-10 DIAGNOSIS — I130 Hypertensive heart and chronic kidney disease with heart failure and stage 1 through stage 4 chronic kidney disease, or unspecified chronic kidney disease: Secondary | ICD-10-CM | POA: Diagnosis not present

## 2016-03-10 DIAGNOSIS — B951 Streptococcus, group B, as the cause of diseases classified elsewhere: Secondary | ICD-10-CM | POA: Diagnosis not present

## 2016-03-10 DIAGNOSIS — E1140 Type 2 diabetes mellitus with diabetic neuropathy, unspecified: Secondary | ICD-10-CM | POA: Diagnosis not present

## 2016-03-10 DIAGNOSIS — J441 Chronic obstructive pulmonary disease with (acute) exacerbation: Secondary | ICD-10-CM | POA: Diagnosis not present

## 2016-03-10 NOTE — Telephone Encounter (Signed)
Patient's daughter notified as instructed by telephone and verbalized understanding. Patient's daughter Jerold Coombe) stated that she will pass this information on to the nurse that will be coming out also to check her dad.

## 2016-03-11 DIAGNOSIS — I339 Acute and subacute endocarditis, unspecified: Secondary | ICD-10-CM | POA: Diagnosis not present

## 2016-03-11 DIAGNOSIS — E1140 Type 2 diabetes mellitus with diabetic neuropathy, unspecified: Secondary | ICD-10-CM | POA: Diagnosis not present

## 2016-03-11 DIAGNOSIS — I509 Heart failure, unspecified: Secondary | ICD-10-CM | POA: Diagnosis not present

## 2016-03-11 DIAGNOSIS — B951 Streptococcus, group B, as the cause of diseases classified elsewhere: Secondary | ICD-10-CM | POA: Diagnosis not present

## 2016-03-11 DIAGNOSIS — J441 Chronic obstructive pulmonary disease with (acute) exacerbation: Secondary | ICD-10-CM | POA: Diagnosis not present

## 2016-03-11 DIAGNOSIS — I130 Hypertensive heart and chronic kidney disease with heart failure and stage 1 through stage 4 chronic kidney disease, or unspecified chronic kidney disease: Secondary | ICD-10-CM | POA: Diagnosis not present

## 2016-03-12 ENCOUNTER — Other Ambulatory Visit: Payer: Self-pay | Admitting: *Deleted

## 2016-03-12 DIAGNOSIS — I509 Heart failure, unspecified: Secondary | ICD-10-CM | POA: Diagnosis not present

## 2016-03-12 DIAGNOSIS — B951 Streptococcus, group B, as the cause of diseases classified elsewhere: Secondary | ICD-10-CM | POA: Diagnosis not present

## 2016-03-12 DIAGNOSIS — E1140 Type 2 diabetes mellitus with diabetic neuropathy, unspecified: Secondary | ICD-10-CM | POA: Diagnosis not present

## 2016-03-12 DIAGNOSIS — I339 Acute and subacute endocarditis, unspecified: Secondary | ICD-10-CM | POA: Diagnosis not present

## 2016-03-12 DIAGNOSIS — J441 Chronic obstructive pulmonary disease with (acute) exacerbation: Secondary | ICD-10-CM | POA: Diagnosis not present

## 2016-03-12 DIAGNOSIS — I130 Hypertensive heart and chronic kidney disease with heart failure and stage 1 through stage 4 chronic kidney disease, or unspecified chronic kidney disease: Secondary | ICD-10-CM | POA: Diagnosis not present

## 2016-03-12 MED ORDER — ATORVASTATIN CALCIUM 20 MG PO TABS: 20.0000 mg | ORAL_TABLET | Freq: Every day | ORAL | Status: DC

## 2016-03-13 DIAGNOSIS — B951 Streptococcus, group B, as the cause of diseases classified elsewhere: Secondary | ICD-10-CM | POA: Diagnosis not present

## 2016-03-13 DIAGNOSIS — I339 Acute and subacute endocarditis, unspecified: Secondary | ICD-10-CM | POA: Diagnosis not present

## 2016-03-13 DIAGNOSIS — J441 Chronic obstructive pulmonary disease with (acute) exacerbation: Secondary | ICD-10-CM | POA: Diagnosis not present

## 2016-03-13 DIAGNOSIS — E1140 Type 2 diabetes mellitus with diabetic neuropathy, unspecified: Secondary | ICD-10-CM | POA: Diagnosis not present

## 2016-03-13 DIAGNOSIS — I509 Heart failure, unspecified: Secondary | ICD-10-CM | POA: Diagnosis not present

## 2016-03-13 DIAGNOSIS — I130 Hypertensive heart and chronic kidney disease with heart failure and stage 1 through stage 4 chronic kidney disease, or unspecified chronic kidney disease: Secondary | ICD-10-CM | POA: Diagnosis not present

## 2016-03-17 DIAGNOSIS — J441 Chronic obstructive pulmonary disease with (acute) exacerbation: Secondary | ICD-10-CM | POA: Diagnosis not present

## 2016-03-17 DIAGNOSIS — B951 Streptococcus, group B, as the cause of diseases classified elsewhere: Secondary | ICD-10-CM | POA: Diagnosis not present

## 2016-03-17 DIAGNOSIS — E1140 Type 2 diabetes mellitus with diabetic neuropathy, unspecified: Secondary | ICD-10-CM | POA: Diagnosis not present

## 2016-03-17 DIAGNOSIS — I130 Hypertensive heart and chronic kidney disease with heart failure and stage 1 through stage 4 chronic kidney disease, or unspecified chronic kidney disease: Secondary | ICD-10-CM | POA: Diagnosis not present

## 2016-03-17 DIAGNOSIS — I509 Heart failure, unspecified: Secondary | ICD-10-CM | POA: Diagnosis not present

## 2016-03-17 DIAGNOSIS — I339 Acute and subacute endocarditis, unspecified: Secondary | ICD-10-CM | POA: Diagnosis not present

## 2016-03-18 ENCOUNTER — Telehealth: Payer: Self-pay

## 2016-03-18 NOTE — Telephone Encounter (Signed)
[facility] notified.  °

## 2016-03-18 NOTE — Telephone Encounter (Signed)
Margarita Grizzle, pts daughter left v/m (DPR); saw red blood in stool on 03/14/16; have not seen any blood since then; pt has dark stools but pt is on iron; Margarita Grizzle wants to know if [facility] nurse could reck labs this week to ck hgb. Margarita Grizzle request cb.

## 2016-03-18 NOTE — Telephone Encounter (Signed)
Yes plz check CBC, BMP at next [facility] nurse visit.

## 2016-03-19 ENCOUNTER — Encounter: Payer: Self-pay | Admitting: *Deleted

## 2016-03-19 ENCOUNTER — Telehealth: Payer: Self-pay

## 2016-03-19 DIAGNOSIS — I339 Acute and subacute endocarditis, unspecified: Secondary | ICD-10-CM | POA: Diagnosis not present

## 2016-03-19 DIAGNOSIS — I509 Heart failure, unspecified: Secondary | ICD-10-CM | POA: Diagnosis not present

## 2016-03-19 DIAGNOSIS — J441 Chronic obstructive pulmonary disease with (acute) exacerbation: Secondary | ICD-10-CM | POA: Diagnosis not present

## 2016-03-19 DIAGNOSIS — E1140 Type 2 diabetes mellitus with diabetic neuropathy, unspecified: Secondary | ICD-10-CM | POA: Diagnosis not present

## 2016-03-19 DIAGNOSIS — B951 Streptococcus, group B, as the cause of diseases classified elsewhere: Secondary | ICD-10-CM | POA: Diagnosis not present

## 2016-03-19 DIAGNOSIS — I130 Hypertensive heart and chronic kidney disease with heart failure and stage 1 through stage 4 chronic kidney disease, or unspecified chronic kidney disease: Secondary | ICD-10-CM | POA: Diagnosis not present

## 2016-03-19 NOTE — Telephone Encounter (Signed)
Recommend take only atorvastatin as per our latest med list.  Ensure not taking both statin together as that could cause worsening muscle soreness. If ongoing soreness on only lipitor, could try QOD dosing for 1 week and update [place] with effect

## 2016-03-19 NOTE — Telephone Encounter (Signed)
Izora Gala OT with [facility] left v/m; has simvastatin and atorvastatin on pts med list;Nancy wants to know which statin pt should be taking; per 02/26/16 visit pt was to continue Atorvastatin 20 mg once daily. On [facility] current med list only see atorvastatin. Pt having muscle soreness and weakness in hips that radiates down pts legs. Izora Gala wants to know if pt could d/c statin and if not clarify which statin pt should take. Izora Gala request cb.

## 2016-03-20 DIAGNOSIS — I339 Acute and subacute endocarditis, unspecified: Secondary | ICD-10-CM | POA: Diagnosis not present

## 2016-03-20 DIAGNOSIS — I509 Heart failure, unspecified: Secondary | ICD-10-CM | POA: Diagnosis not present

## 2016-03-20 DIAGNOSIS — E1140 Type 2 diabetes mellitus with diabetic neuropathy, unspecified: Secondary | ICD-10-CM | POA: Diagnosis not present

## 2016-03-20 DIAGNOSIS — B951 Streptococcus, group B, as the cause of diseases classified elsewhere: Secondary | ICD-10-CM | POA: Diagnosis not present

## 2016-03-20 DIAGNOSIS — N183 Chronic kidney disease, stage 3 (moderate): Secondary | ICD-10-CM | POA: Diagnosis not present

## 2016-03-20 DIAGNOSIS — I130 Hypertensive heart and chronic kidney disease with heart failure and stage 1 through stage 4 chronic kidney disease, or unspecified chronic kidney disease: Secondary | ICD-10-CM | POA: Diagnosis not present

## 2016-03-20 DIAGNOSIS — J441 Chronic obstructive pulmonary disease with (acute) exacerbation: Secondary | ICD-10-CM | POA: Diagnosis not present

## 2016-03-20 LAB — HEMOGLOBIN: HGB: 9.4 g/dL

## 2016-03-20 NOTE — Telephone Encounter (Signed)
Izora Gala notified and will get clarification. She will have his daughter update [place] in 1 week.

## 2016-03-22 NOTE — Telephone Encounter (Signed)
Received labs - only Hgb/HCT checked, not BMP.  Hgb stable at 9.4. Monitor for recurring blood in stool.

## 2016-03-24 ENCOUNTER — Ambulatory Visit (INDEPENDENT_AMBULATORY_CARE_PROVIDER_SITE_OTHER): Payer: Medicare Other | Admitting: Neurology

## 2016-03-24 ENCOUNTER — Encounter: Payer: Self-pay | Admitting: Neurology

## 2016-03-24 VITALS — BP 115/67 | HR 96 | Ht 66.0 in | Wt 190.8 lb

## 2016-03-24 DIAGNOSIS — I6330 Cerebral infarction due to thrombosis of unspecified cerebral artery: Secondary | ICD-10-CM

## 2016-03-24 DIAGNOSIS — I509 Heart failure, unspecified: Secondary | ICD-10-CM | POA: Diagnosis not present

## 2016-03-24 DIAGNOSIS — E1140 Type 2 diabetes mellitus with diabetic neuropathy, unspecified: Secondary | ICD-10-CM | POA: Diagnosis not present

## 2016-03-24 DIAGNOSIS — B951 Streptococcus, group B, as the cause of diseases classified elsewhere: Secondary | ICD-10-CM | POA: Diagnosis not present

## 2016-03-24 DIAGNOSIS — I63119 Cerebral infarction due to embolism of unspecified vertebral artery: Secondary | ICD-10-CM

## 2016-03-24 DIAGNOSIS — I339 Acute and subacute endocarditis, unspecified: Secondary | ICD-10-CM | POA: Diagnosis not present

## 2016-03-24 DIAGNOSIS — I130 Hypertensive heart and chronic kidney disease with heart failure and stage 1 through stage 4 chronic kidney disease, or unspecified chronic kidney disease: Secondary | ICD-10-CM | POA: Diagnosis not present

## 2016-03-24 DIAGNOSIS — J441 Chronic obstructive pulmonary disease with (acute) exacerbation: Secondary | ICD-10-CM | POA: Diagnosis not present

## 2016-03-24 NOTE — Patient Instructions (Signed)
I had a long d/w patient about his recent embolic strokes. endocarditis, atrial fibrillationrisk for recurrent stroke/TIAs, personally independently reviewed imaging studies and stroke evaluation results and answered questions.Continue aspirin 81 mg daily  for secondary stroke prevention for now but will need to switch to anticoagulation with NOAC once anemia issue is stable and maintain strict control of hypertension with blood pressure goal below 130/90, diabetes with hemoglobin A1c goal below 6.5% and lipids with LDL cholesterol goal below 70 mg/dL. I also advised the patient to eat a healthy diet with plenty of whole grains, cereals, fruits and vegetables, exercise regularly and maintain ideal body weight .he was advised to use his walker at all times and fall and safety precautions. Followup in the future with me in 6 months or call earlier if needed. Stroke Prevention Some medical conditions and behaviors are associated with an increased chance of having a stroke. You may prevent a stroke by making healthy choices and managing medical conditions. HOW CAN I REDUCE MY RISK OF HAVING A STROKE?   Stay physically active. Get at least 30 minutes of activity on most or all days.  Do not smoke. It may also be helpful to avoid exposure to secondhand smoke.  Limit alcohol use. Moderate alcohol use is considered to be:  No more than 2 drinks per day for men.  No more than 1 drink per day for nonpregnant women.  Eat healthy foods. This involves:  Eating 5 or more servings of fruits and vegetables a day.  Making dietary changes that address high blood pressure (hypertension), high cholesterol, diabetes, or obesity.  Manage your cholesterol levels.  Making food choices that are high in fiber and low in saturated fat, trans fat, and cholesterol may control cholesterol levels.  Take any prescribed medicines to control cholesterol as directed by your health care provider.  Manage your  diabetes.  Controlling your carbohydrate and sugar intake is recommended to manage diabetes.  Take any prescribed medicines to control diabetes as directed by your health care provider.  Control your hypertension.  Making food choices that are low in salt (sodium), saturated fat, trans fat, and cholesterol is recommended to manage hypertension.  Ask your health care provider if you need treatment to lower your blood pressure. Take any prescribed medicines to control hypertension as directed by your health care provider.  If you are 38-61 years of age, have your blood pressure checked every 3-5 years. If you are 66 years of age or older, have your blood pressure checked every year.  Maintain a healthy weight.  Reducing calorie intake and making food choices that are low in sodium, saturated fat, trans fat, and cholesterol are recommended to manage weight.  Stop drug abuse.  Avoid taking birth control pills.  Talk to your health care provider about the risks of taking birth control pills if you are over 66 years old, smoke, get migraines, or have ever had a blood clot.  Get evaluated for sleep disorders (sleep apnea).  Talk to your health care provider about getting a sleep evaluation if you snore a lot or have excessive sleepiness.  Take medicines only as directed by your health care provider.  For some people, aspirin or blood thinners (anticoagulants) are helpful in reducing the risk of forming abnormal blood clots that can lead to stroke. If you have the irregular heart rhythm of atrial fibrillation, you should be on a blood thinner unless there is a good reason you cannot take them.  Understand all  your medicine instructions.  Make sure that other conditions (such as anemia or atherosclerosis) are addressed. SEEK IMMEDIATE MEDICAL CARE IF:   You have sudden weakness or numbness of the face, arm, or leg, especially on one side of the body.  Your face or eyelid droops to one  side.  You have sudden confusion.  You have trouble speaking (aphasia) or understanding.  You have sudden trouble seeing in one or both eyes.  You have sudden trouble walking.  You have dizziness.  You have a loss of balance or coordination.  You have a sudden, severe headache with no known cause.  You have new chest pain or an irregular heartbeat. Any of these symptoms may represent a serious problem that is an emergency. Do not wait to see if the symptoms will go away. Get medical help at once. Call your local emergency services (911 in [place] Do not drive yourself to the hospital.   This information is not intended to replace advice given to you by your health care provider. Make sure you discuss any questions you have with your health care provider.   Document Released: 11/13/2004 Document Revised: 10/27/2014 Document Reviewed: 04/08/2013 Elsevier Interactive Patient Education [employer].

## 2016-03-24 NOTE — Progress Notes (Signed)
[facility] [address] [place]. [place] [address] [phone]       OFFICE FOLLOW-UP NOTE  Eric [name] Date of Birth:  1927-09-28 Medical Record Number:  [id number]   HPI: Eric [name] is a 82 year male seen for first office f/u visit after [facility] admission for stroke in March 2017.Complicated hospitalization presented to ER with fall and found to have encephalopathy, SIRS, respiratory failure from COPD exaerbation, acute on chronic kidney disease, acute dCHF exacerbation, and afib with RVR. Also  Hospitalization further complicated by acute  embolic strokes.MRI showed multiple small areas of acute infarct including the right cerebellum, right basal ganglia, and right parietal lobe. Probable emboli  with bacteremia with strep B agalactiae and hospital acquired pneumonia.. Carotid ultrasound was unremarkable. MRA brain showed no large vessel stenosis. Hemoglobin A1c was 7.1. LDL cholesterol is 56 mg percent. Found to have mitral valve vegetation 01/15/2016 (in known h/o aortic bioprosthesis) on TEE but felt not to be a surgical candidate. Had multiple med changes - records reviewed. On vancomycin IV (ampicillin allergy) through [facility] central line line. Last abx dose was 02/29/16 consulted with ID, neurology, cardiology  CHADSvasc score = 7, suggested re evaluate anticoagulant eligibility when abx course complete and fully resolved from endocarditis. Patient states his done well since discharge. His been off antibiotics now for 3 weeks. He has made good physical as well as cognitive recovery. His able to walk with a walker. His balance is not good but he is careful and has not had any falls. His blood pressure medications have been decreased as he was running hyportension.Blood pressure today is 115/67. He did see his primary care physician a month ago and has a follow-up visit tomorrow. He has not seen infectious disease doctor yet. He is had low hemoglobin recently and hence he has  not been switched from aspirin to eliquis yet.  ROS:   14 system review of systems is positive for unsteady gait, anemia only and all other systems negative  PMH:  Past Medical History  Diagnosis Date  . Cerebrovascular disease, unspecified   . Coronary atherosclerosis of unspecified type of vessel, native or graft     s/p CABG 2010  . Esophageal reflux   . Pure hypercholesterolemia   . Essential hypertension, benign   . Type II or unspecified type diabetes mellitus with neurological manifestations, not stated as uncontrolled   . Bilateral hydrocele 3/11    [facility]  . History of aortic stenosis     s/p valve replacment 2010  . Cervicalgia   . Foraminal stenosis of cervical region     bilateral-C4-C5, C5-C6  . Colon polyp   . Shingles 07/27/2012  . 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 Stroke [facility])     Social History:  Social History   Social History  . Marital Status: Widowed    Spouse Name: N/A  . Number of Children: 3  . Years of Education: N/A   Occupational History  . retired-truck driver    Social History Main Topics  . Smoking status: Former Smoker -- 1.00 packs/day for 50 years    Types: Cigarettes    Quit date: 10/20/1993  . Smokeless tobacco: Never Used  . Alcohol Use: 0.6 oz/week    0 Standard drinks or equivalent, 1 Cans of beer per week     Comment: occasional  . Drug Use: No  . Sexual Activity: No  Other Topics Concern  . Not on file   Social History Narrative   Caffeine: 2-3 cups coffee   Lives with son   Retired Educational psychologist, lives with 2 sons, 1 daughter in [place]   Activity: no regular exercise 2/2 hip pain and SOB.    Medications:   Current Outpatient Prescriptions on File Prior to Visit  Medication Sig Dispense Refill  . aspirin 81 MG chewable tablet Chew 81 mg by mouth daily.    Marland Kitchen atorvastatin (LIPITOR) 20 MG tablet Take 1 tablet (20 mg total) by mouth  daily at 6 PM. 90 tablet 1  . diltiazem (CARDIZEM) 60 MG tablet Take 1 tablet (60 mg total) by mouth 2 (two) times daily. Hold if HR < 60/min SBP < 110 60 tablet 3  . FAMOTIDINE PO Take 1 tablet by mouth daily with breakfast.    . ferrous sulfate 325 (65 FE) MG tablet Take 1 tablet (325 mg total) by mouth daily. 90 tablet 1  . glipiZIDE (GLUCOTROL) 10 MG tablet Take 10 mg by mouth daily before breakfast.    . insulin glargine (LANTUS) 100 UNIT/ML injection Check CBG. Inject 30 units sub q daily at bedtime    . nitroGLYCERIN (NITROSTAT) 0.4 MG SL tablet Place 0.4 mg under the tongue every 5 (five) minutes as needed. Call MD/NP if pain unrelieved.    . potassium chloride (K-DUR) 10 MEQ tablet Take 1 tablet (10 mEq total) by mouth daily. 90 tablet 3  . OXYGEN Inhale 3 L into the lungs. Reported on 03/24/2016     No current facility-administered medications on file prior to visit.    Allergies:   Allergies  Allergen Reactions  . Ampicillin Rash    Skin rash    Physical Exam General: well developed, well nourished elderly male seated, in no evident distress Head: head normocephalic and atraumatic.  Neck: supple with no carotid or supraclavicular bruits Cardiovascular: regular rate and rhythm, no murmurs Musculoskeletal: no deformity Skin:  no rash/petichiae Vascular:  Normal pulses all extremities Filed Vitals:   03/24/16 1445  BP: 115/67  Pulse: 96   Neurologic Exam Mental Status: Awake and fully alert. Oriented to place and time. Recent and remote memory intact. Attention span, concentration and fund of knowledge appropriate. Mood and affect appropriate.  Cranial Nerves: Fundoscopic exam reveals sharp disc margins. Pupils equal, briskly reactive to light. Extraocular movements full without nystagmus. Visual fields full to confrontation. Hearing intact. Facial sensation intact. Face, tongue, palate moves normally and symmetrically.  Motor: Normal bulk and tone. Normal strength in all  tested extremity muscles. Sensory.: intact to touch ,pinprick .position and vibratory sensation.  Coordination: Rapid alternating movements normal in all extremities. Finger-to-nose and heel-to-shin performed accurately bilaterally. Gait and Station: Arises from chair without difficulty. Stance is broad-based. Uses a walker.. Gait demonstrates normal stride length and balance . Unable to heel, toe and tandem walk without difficulty.  Reflexes: 1+ and symmetric. Toes downgoing.   NIHSS  0 Modified Rankin  2   ASSESSMENT: 81 year male with embolic cerebral infarcts in April 2017 secondary to bacterial endocarditis involving mitral valve. Clinically he is doing quite well but may need consideration of switching from aspirin to eliquis for anticoagulation given history of atrial fibrillation and CHAD2VASC score of 7    PLAN: I had a long d/w patient about his recent embolic strokes. endocarditis, atrial fibrillationrisk for recurrent stroke/TIAs, personally independently reviewed imaging studies and stroke evaluation results and answered questions.Continue aspirin  81 mg daily  for secondary stroke prevention for now but will need to switch to anticoagulation with NOAC once anemia issue is stable and maintain strict control of hypertension with blood pressure goal below 130/90, diabetes with hemoglobin A1c goal below 6.5% and lipids with LDL cholesterol goal below 70 mg/dL. I also advised the patient to eat a healthy diet with plenty of whole grains, cereals, fruits and vegetables, exercise regularly and maintain ideal body weight .he was advised to use his walker at all times and fall and safety precautions. Followup in the future with me in 6 months or call earlier if needed. Greater than 50% of time during this 25 minute visit was spent on counseling,explanation of diagnosis, planning of further management, discussion with patient and family and coordination of care Antony Contras, MD  [facility] [address] [place], [place] [address]  Phone [phone] Fax [phone] Note: This document was prepared with digital dictation and possible smart phrase technology. Any transcriptional errors that result from this process are unintentional

## 2016-03-24 NOTE — Telephone Encounter (Signed)
Unable to leave message

## 2016-03-25 ENCOUNTER — Ambulatory Visit (INDEPENDENT_AMBULATORY_CARE_PROVIDER_SITE_OTHER): Payer: Medicare Other | Admitting: Family Medicine

## 2016-03-25 ENCOUNTER — Encounter: Payer: Self-pay | Admitting: Family Medicine

## 2016-03-25 VITALS — BP 100/64 | HR 88 | Temp 98.1°F | Wt 190.8 lb

## 2016-03-25 DIAGNOSIS — I639 Cerebral infarction, unspecified: Secondary | ICD-10-CM | POA: Diagnosis not present

## 2016-03-25 DIAGNOSIS — I339 Acute and subacute endocarditis, unspecified: Secondary | ICD-10-CM | POA: Diagnosis not present

## 2016-03-25 DIAGNOSIS — D649 Anemia, unspecified: Secondary | ICD-10-CM

## 2016-03-25 DIAGNOSIS — J441 Chronic obstructive pulmonary disease with (acute) exacerbation: Secondary | ICD-10-CM | POA: Diagnosis not present

## 2016-03-25 DIAGNOSIS — B951 Streptococcus, group B, as the cause of diseases classified elsewhere: Secondary | ICD-10-CM | POA: Diagnosis not present

## 2016-03-25 DIAGNOSIS — E1140 Type 2 diabetes mellitus with diabetic neuropathy, unspecified: Secondary | ICD-10-CM | POA: Diagnosis not present

## 2016-03-25 DIAGNOSIS — E7800 Pure hypercholesterolemia, unspecified: Secondary | ICD-10-CM

## 2016-03-25 DIAGNOSIS — N183 Chronic kidney disease, stage 3 unspecified: Secondary | ICD-10-CM

## 2016-03-25 DIAGNOSIS — I6330 Cerebral infarction due to thrombosis of unspecified cerebral artery: Secondary | ICD-10-CM | POA: Diagnosis not present

## 2016-03-25 DIAGNOSIS — I130 Hypertensive heart and chronic kidney disease with heart failure and stage 1 through stage 4 chronic kidney disease, or unspecified chronic kidney disease: Secondary | ICD-10-CM | POA: Diagnosis not present

## 2016-03-25 DIAGNOSIS — I10 Essential (primary) hypertension: Secondary | ICD-10-CM

## 2016-03-25 DIAGNOSIS — I509 Heart failure, unspecified: Secondary | ICD-10-CM | POA: Diagnosis not present

## 2016-03-25 MED ORDER — DILTIAZEM HCL 60 MG PO TABS: ORAL_TABLET | ORAL | Status: DC

## 2016-03-25 NOTE — Telephone Encounter (Addendum)
See office visit from today. plz call [facility] and d/c supplemental home oxygen order. Thanks.

## 2016-03-25 NOTE — Progress Notes (Signed)
Pre visit review using our clinic review tool, if applicable. No additional management support is needed unless otherwise documented below in the visit note. 

## 2016-03-25 NOTE — Assessment & Plan Note (Signed)
Treatment limited by hypotension now - will decrease diltiazem to 30mg  in am and 60mg  in pm. Update with effect.

## 2016-03-25 NOTE — Assessment & Plan Note (Signed)
Currently on aspirin due to anemia and possible GI blood loss. Will want to start xarelto when able. Check CBC today.

## 2016-03-25 NOTE — Progress Notes (Signed)
BP 100/64 mmHg  Pulse 88  Temp(Src) 98.1 F (36.7 C)  Wt 190 lb 12.8 oz (86.546 kg)  SpO2 98%   CC: 4 wk f/u visit  Subjective:    Patient ID: Eric [name], male    DOB: 1927-01-01, 82 y.o.   MRN: [id number]  HPI: Eric [name] is a 82 y.o. male presenting on 03/25/2016 for Follow-up   See prior note for details. Recent hospitalization predominantly for acute embolic stroke with strep B alagactiae bacteremia (MV vegetation) and HAP. Seen by Dr Leonie Man neurologist yesterday - ongoing right sided weakness. Continues aspirin 81mg  daily due to ongoing concerns over anemia and possible lower GI bleed. Has not returned iFOB.   Staying lightheaded, dizzy, weak.  No abd pain, nausea/vomiting, fevers/chills, diarrhea/constipation. No blood in stool noted. Denies palpitations.   Not using oxygen at home. O2 sats 98% when HH at home even after PT exertion. Requests D/C O2 at home.   DM - not checking sugars.   Relevant past medical, surgical, family and social history reviewed and updated as indicated. Interim medical history since our last visit reviewed. Allergies and medications reviewed and updated. Current Outpatient Prescriptions on File Prior to Visit  Medication Sig  . aspirin 81 MG chewable tablet Chew 81 mg by mouth daily.  Marland Kitchen atorvastatin (LIPITOR) 20 MG tablet Take 1 tablet (20 mg total) by mouth daily at 6 PM.  . FAMOTIDINE PO Take 1 tablet by mouth daily with breakfast.  . ferrous sulfate 325 (65 FE) MG tablet Take 1 tablet (325 mg total) by mouth daily.  Marland Kitchen glipiZIDE (GLUCOTROL) 10 MG tablet Take 10 mg by mouth daily before breakfast.  . insulin glargine (LANTUS) 100 UNIT/ML injection Check CBG. Inject 30 units sub q daily at bedtime  . nitroGLYCERIN (NITROSTAT) 0.4 MG SL tablet Place 0.4 mg under the tongue every 5 (five) minutes as needed. Call MD/NP if pain unrelieved.   No current facility-administered medications on file prior to visit.    Review of Systems Per HPI  unless specifically indicated in ROS section     Objective:    BP 100/64 mmHg  Pulse 88  Temp(Src) 98.1 F (36.7 C)  Wt 190 lb 12.8 oz (86.546 kg)  SpO2 98%  Wt Readings from Last 3 Encounters:  03/25/16 190 lb 12.8 oz (86.546 kg)  03/24/16 190 lb 12.8 oz (86.546 kg)  02/28/16 188 lb (85.276 kg)    Physical Exam  Constitutional: He appears well-developed and well-nourished. No distress.  HENT:  Mouth/Throat: Oropharynx is clear and moist. No oropharyngeal exudate.  Cardiovascular: Normal rate, regular rhythm, normal heart sounds and intact distal pulses.   No murmur heard. Sounds regular  Pulmonary/Chest: Effort normal and breath sounds normal. No respiratory distress. He has no wheezes. He has no rales.  Bibasilar crackles  Musculoskeletal: He exhibits no edema.  Skin: There is pallor.  Nursing note and vitals reviewed.  Results for orders placed or performed in visit on 03/19/16  Hemoglobin  Result Value Ref Range   HGB 9.2 g/dL   Lab Results  Component Value Date   HGBA1C 7.1* 12/25/2015   Lab Results  Component Value Date   LDLCALC 52 01/07/2016       Assessment & Plan:   Problem List Items Addressed This Visit    Type 2 diabetes, controlled, with neuropathy ([place])    Not checking sugars at home. Continue lantus and glipizide. Too early for A1c - check next visit.  HYPERCHOLESTEROLEMIA    Holding atorvastatin has not helped myalgias. Check CPK, restart atorvastatin 20mg  daily.      Relevant Medications   diltiazem (CARDIZEM) 60 MG tablet   Other Relevant Orders   CK   CKD (chronic kidney disease) stage 3, GFR 30-59 ml/min   Essential hypertension    Treatment limited by hypotension now - will decrease diltiazem to 30mg  in am and 60mg  in pm. Update with effect.      Relevant Medications   diltiazem (CARDIZEM) 60 MG tablet   CVA (cerebral infarction) - Primary    Currently on aspirin due to anemia and possible GI blood loss. Will want to start  xarelto when able. Check CBC today.       Anemia, unspecified    Recheck Hgb today. Continue aspirin 81mg  daily for now. ?chronic GI blood loss. Encouraged he complete iFOB.           Follow up plan: Return in about 4 weeks (around 04/22/2016), or as needed, for follow up visit.  Eric Bush, MD

## 2016-03-25 NOTE — Patient Instructions (Addendum)
Labs today Restart atorvastatin 85m daily.  Work on [employer]  Ok to stop home oxygen.  Decrease diltiazem to 368min am and 6075mn pm.  Good to see you today, call us [place] questions. Return as needed or in 1 month for follow up.

## 2016-03-25 NOTE — Assessment & Plan Note (Signed)
Holding atorvastatin has not helped myalgias. Check CPK, restart atorvastatin 20mg  daily.

## 2016-03-25 NOTE — Telephone Encounter (Signed)
Message left advising patient's daughter.  

## 2016-03-25 NOTE — Assessment & Plan Note (Signed)
Not checking sugars at home. Continue lantus and glipizide. Too early for A1c - check next visit.

## 2016-03-25 NOTE — Assessment & Plan Note (Signed)
Recheck Hgb today. Continue aspirin 81mg  daily for now. ?chronic GI blood loss. Encouraged he complete iFOB.

## 2016-03-26 LAB — RENAL FUNCTION PANEL
Albumin: 3.5 g/dL (ref 3.5–5.2)
BUN: 17 mg/dL (ref 6–23)
CALCIUM: 8.8 mg/dL (ref 8.4–10.5)
CO2: 26 meq/L (ref 19–32)
Chloride: 104 mEq/L (ref 96–112)
Creatinine, Ser: 1.47 mg/dL (ref 0.40–1.50)
GFR: 48.13 mL/min — AB (ref >60.00)
GLUCOSE: 116 mg/dL — AB (ref 70–99)
POTASSIUM: 4.1 meq/L (ref 3.5–5.1)
Phosphorus: 3.5 mg/dL (ref 2.3–4.6)
Sodium: 136 mEq/L (ref 135–145)

## 2016-03-26 LAB — CBC WITH DIFFERENTIAL/PLATELET
BASOS ABS: 0 10*3/uL (ref 0.0–0.1)
Basophils Relative: 0.3 % (ref 0.0–3.0)
Eosinophils Absolute: 0.2 10*3/uL (ref 0.0–0.7)
Eosinophils Relative: 3.6 % (ref 0.0–5.0)
HEMATOCRIT: 31.7 % — AB (ref 39.0–52.0)
Hemoglobin: 10.3 g/dL — ABNORMAL LOW (ref 13.0–17.0)
LYMPHS PCT: 11.8 % — AB (ref 12.0–46.0)
Lymphs Abs: 0.7 10*3/uL (ref 0.7–4.0)
MCHC: 32.6 g/dL (ref 30.0–36.0)
MCV: 89.7 fl (ref 78.0–100.0)
MONOS PCT: 8 % (ref 3.0–12.0)
Monocytes Absolute: 0.5 10*3/uL (ref 0.1–1.0)
Neutro Abs: 4.5 10*3/uL (ref 1.4–7.7)
Neutrophils Relative %: 76.3 % (ref 43.0–77.0)
Platelets: 251 10*3/uL (ref 150.0–400.0)
RBC: 3.53 Mil/uL — AB (ref 4.22–5.81)
RDW: 16.2 % — ABNORMAL HIGH (ref 11.5–15.5)
WBC: 5.9 10*3/uL (ref 4.0–10.5)

## 2016-03-26 LAB — CK: Total CK: 22 U/L (ref 7–232)

## 2016-03-26 NOTE — Telephone Encounter (Signed)
Order faxed to D/C O2

## 2016-03-27 ENCOUNTER — Encounter: Payer: Self-pay | Admitting: *Deleted

## 2016-04-01 ENCOUNTER — Ambulatory Visit (INDEPENDENT_AMBULATORY_CARE_PROVIDER_SITE_OTHER): Payer: Medicare Other | Admitting: Internal Medicine

## 2016-04-01 ENCOUNTER — Encounter: Payer: Self-pay | Admitting: Family Medicine

## 2016-04-01 ENCOUNTER — Encounter: Payer: Self-pay | Admitting: Internal Medicine

## 2016-04-01 VITALS — BP 114/68 | HR 94 | Temp 97.1°F

## 2016-04-01 DIAGNOSIS — Z8673 Personal history of transient ischemic attack (TIA), and cerebral infarction without residual deficits: Secondary | ICD-10-CM

## 2016-04-01 DIAGNOSIS — R42 Dizziness and giddiness: Secondary | ICD-10-CM | POA: Diagnosis not present

## 2016-04-01 DIAGNOSIS — I6330 Cerebral infarction due to thrombosis of unspecified cerebral artery: Secondary | ICD-10-CM

## 2016-04-01 DIAGNOSIS — I330 Acute and subacute infective endocarditis: Secondary | ICD-10-CM | POA: Diagnosis not present

## 2016-04-01 NOTE — Progress Notes (Signed)
RFV: hospital follow up for GBS MV endocarditis Subjective:    Patient ID: Eric [name], male    DOB: [birth year], 82 y.o.   MRN: [id number]  HPI 82 yo M who was hospitalized this Spring for acute embolic stroke with strep B alagactiae bacteremia (MV vegetation) and HAP. Seen by Dr Leonie Man for ongoing right sided weakness. Continues aspirin 81mg  daily. For his group b strep native MV endocarditis given 6 wk of treatment through may 1st 2017. He was initially on ampicillin but developed significant rash requiring short burst of prednisone and changed to vancomycin for duration of treatment. Repeat echo showed resolution of vegetation.  Current Outpatient Prescriptions on File Prior to Visit  Medication Sig Dispense Refill  . aspirin 81 MG chewable tablet Chew 81 mg by mouth daily.    Marland Kitchen atorvastatin (LIPITOR) 20 MG tablet Take 1 tablet (20 mg total) by mouth daily at 6 PM. 90 tablet 1  . diltiazem (CARDIZEM) 60 MG tablet Take 1/2 tablet (30mg ) in am and 1 tablet (60mg ) in pm. Hold if HR < 60/min SBP < 110 60 tablet 3  . FAMOTIDINE PO Take 1 tablet by mouth daily with breakfast.    . ferrous sulfate 325 (65 FE) MG tablet Take 1 tablet (325 mg total) by mouth daily. 90 tablet 1  . glipiZIDE (GLUCOTROL) 10 MG tablet Take 10 mg by mouth daily before breakfast.    . insulin glargine (LANTUS) 100 UNIT/ML injection Check CBG. Inject 30 units sub q daily at bedtime    . nitroGLYCERIN (NITROSTAT) 0.4 MG SL tablet Place 0.4 mg under the tongue every 5 (five) minutes as needed. Call MD/NP if pain unrelieved.     No current facility-administered medications on file prior to visit.   Active Ambulatory Problems    Diagnosis Date Noted  . Type 2 diabetes, controlled, with neuropathy ([name]) 12/17/2006  . NEUROPATHY, DIABETIC 12/17/2006  . HYPERCHOLESTEROLEMIA 12/17/2006  . Coronary atherosclerosis 12/17/2006  . Cerebrovascular disease 02/28/2009  . CKD (chronic kidney disease) stage 3, GFR 30-59 ml/min  09/24/2010  . BPH (benign prostatic hypertrophy) with urinary obstruction 01/10/2010  . AORTIC VALVE REPLACEMENT, HX OF 09/03/2010  . Medicare annual wellness visit, subsequent 03/08/2012  . Status post total replacement of left hip 06/30/2014  . Paroxysmal atrial fibrillation ([name]) 07/02/2014  . GERD (gastroesophageal reflux disease) 07/08/2014  . Advanced care planning/counseling discussion 03/29/2015  . S/P CABG (coronary artery bypass graft) 12/25/2015  . S/P AVR (aortic valve replacement) 12/25/2015  . Dilated cardiomyopathy ([name])   . Pulmonary hypertension ([name])   . Essential hypertension   . Acute on chronic renal failure ([name])   . Bacteremia   . CVA (cerebral infarction)   . Acute endocarditis   . Carotid stenosis 02/28/2016  . Anemia, unspecified 02/28/2016   Resolved Ambulatory Problems    Diagnosis Date Noted  . CERUMEN IMPACTION 09/24/2010  . HYPERTENSION, BENIGN SYSTEMIC 12/17/2006  . Skin lesion 12/01/2011  . Cerumen impaction 03/08/2012  . Shingles 07/27/2012  . Left hip pain 11/03/2013  . Elbow laceration 11/03/2013  . Falls 11/03/2013  . Delirium, drug-induced ([name]) 07/02/2014  . Bronchitis 12/25/2015  . Acute respiratory distress ([name] 12/25/2015  . AKI (acute kidney injury) ([name]) 12/25/2015  . Dehydration   . Atrial fibrillation with RVR ([name]) 12/25/2015  . Sepsis ([name])   . COPD exacerbation ([name])   . Metabolic acidosis   . H/O aortic valve replacement   . Uncontrolled type 2 diabetes mellitus with complication ([name])   .  Hyponatremia   . Hypotension   . Delirium   . Acute renal failure ([name])   . Pressure ulcer 01/07/2016  . Slurred speech   . Insulin dependent diabetes mellitus ([name])    Past Medical History  Diagnosis Date  . Cerebrovascular disease, unspecified   . Coronary atherosclerosis of unspecified type of vessel, native or graft   . Esophageal reflux   . Bilateral hydrocele 3/11  . History of aortic stenosis   . Cervicalgia   .  Foraminal stenosis of cervical region   . Colon polyp   . Vegetative endocarditis of mitral valve 12/2015  . Stroke [facility])    Social History  Substance Use Topics  . Smoking status: Former Smoker -- 1.00 packs/day for 50 years    Types: Cigarettes    Quit date: 10/20/1993  . Smokeless tobacco: Never Used  . Alcohol Use: 0.6 oz/week    0 Standard drinks or equivalent, 1 Cans of beer per week     Comment: occasional    Review of Systems  +lightheadedness, no loss of consciousness or syncope. 10 point ros is otherwise negative    Objective:   Physical Exam BP 114/68 mmHg  Pulse 94  Temp(Src) 97.1 F (36.2 C) (Oral) gen = a x o by 3 in NAD HEENT =MMM, oral pharynx is clear Cors:Irreg,irreg, +murmur Pulm= CTAB, no w/c/r Ext= trace edema      Assessment & Plan:  Group b strep native MV endocarditis = Doing well since completion of endocarditis treatment  occ lightheadedness =recent decrease in his BP meds to minimize symptoms  Hx of CVA, right sided weakness = continue to follow up with dr. Leonie Man  rtc as needed

## 2016-04-11 ENCOUNTER — Telehealth: Payer: Self-pay

## 2016-04-11 NOTE — Telephone Encounter (Signed)
Laurie left v/m [facility]; I called Margarita Grizzle and advised diltiazem refill done to [facility] on 03/25/16. Margarita Grizzle said that was not correct that [facility]. I asked Marg[facility]ita Grizzle to contact [facility] to see if pt had available refills that might be on hold; I was looking at hx med list to see if refills might have been sent [facility]; the call was disconnected. I tried to call Margarita Grizzle back no answer and left v/m to see if pt was out of med and to let Margarita Grizzle know from 03/25/16 visit that diltiazem had been decreased and to get update on how pt was doing since med was decreased. I tried multiple times to call Margarita Grizzle back with out success.

## 2016-04-14 ENCOUNTER — Other Ambulatory Visit: Payer: Self-pay | Admitting: *Deleted

## 2016-04-14 MED ORDER — DILTIAZEM HCL 60 MG PO TABS: ORAL_TABLET | ORAL | Status: DC

## 2016-04-14 NOTE — Telephone Encounter (Signed)
See 04/14/16 refill note.

## 2016-04-25 NOTE — Progress Notes (Signed)
HPI: FU AVR and coronary artery disease. His previous workup showed three-vessel coronary disease and severe aortic stenosis. The patient therefore on December 22 2008, underwent aortic valve replacement with #25-mm [facility] Ease pericardial tissue valve as well as coronary artery bypassing graft with a LIMA to the LAD, saphenous vein graft to the circ marginal and the saphenous vein graft to the PDA. Patient seen in the hospital March 2017 with new-onset atrial fibrillation, pneumonia and endocarditis. He received therapy for his endocarditis with antibiotics. Carotid Dopplers in March 2017 showed 1-39 bilateral stenosis. Transesophageal echocardiogram March 2017 showed normal LV systolic function, bioprosthetic aortic valve, moderate to severe mitral annular calcification with mild mitral stenosis, moderate mitral regurgitation and a 2 cm vegetation attached to the posterior mitral valve leaflet. Mild biatrial enlargement. Abdominal CT April 2017 showed abdominal aortic aneurysm 3.2 cm. Last echocardiogram May 2017 showed normal LV systolic function, mitral annular calcification with mild mitral stenosis and mild mitral regurgitation, mild left atrial enlargement and mild tricuspid regurgitation. Since I last saw him,   Current Outpatient Prescriptions  Medication Sig Dispense Refill  . aspirin 81 MG chewable tablet Chew 81 mg by mouth daily.    Marland Kitchen atorvastatin (LIPITOR) 20 MG tablet Take 1 tablet (20 mg total) by mouth daily at 6 PM. 90 tablet 1  . diltiazem (CARDIZEM) 60 MG tablet Take 1/2 tablet (30mg ) in am and 1 tablet (60mg ) in pm. Hold if HR < 60/min SBP < 110 60 tablet 3  . FAMOTIDINE PO Take 1 tablet by mouth daily with breakfast.    . glipiZIDE (GLUCOTROL) 10 MG tablet Take 10 mg by mouth daily before breakfast.    . insulin glargine (LANTUS) 100 UNIT/ML injection Check CBG. Inject 30 units sub q daily at bedtime    . nitroGLYCERIN (NITROSTAT) 0.4 MG SL tablet Place 0.4 mg under the  tongue every 5 (five) minutes as needed. Call MD/NP if pain unrelieved.     No current facility-administered medications for this visit.     Past Medical History  Diagnosis Date  . Cerebrovascular disease, unspecified   . Coronary atherosclerosis of unspecified type of vessel, native or graft     s/p CABG 2010  . Esophageal reflux   . Pure hypercholesterolemia   . Essential hypertension, benign   . Type II or unspecified type diabetes mellitus with neurological manifestations, not stated as uncontrolled   . Bilateral hydrocele 3/11    [facility]  . History of aortic stenosis     s/p valve replacment 2010  . Cervicalgia   . Foraminal stenosis of cervical region     bilateral-C4-C5, C5-C6  . Colon polyp   . Shingles 07/27/2012  . 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 Stroke [facility])     Past Surgical History  Procedure Laterality Date  . Cataract extraction  1990's    bilateral  . Cholecystectomy  1995  . Colonoscopy  1997    Mult. polyps- [place]  . Sphincterectomy  09/20/03    for jaundice  . Ptca  12/99    with stent  . Coronary artery bypass graft  3/10    x3-using a left internal mammary artery to the left anterior descending coronary artery, saphenous vein graft to circumflex marginal branch, spahenous vein graft  to posterior descendingcoronary artery. Endoscopic saphenous vein harvest from bilateral thighs was done.   . Aortic valve replacement  12/2008  with pericardial tissue valve  . Carotid US  10/2009    B ICA stenosis, stable disease, rec rpt 2 years  . Hydrocele excision      bilateral ([facility])  . L leg trauma  1957    truck over left leg  . Total hip arthroplasty Left 06/30/2014    Procedure: LEFT TOTAL HIP ARTHROPLASTY ANTERIOR APPROACH;  Surgeon: Mcarthur Rossetti, MD;  Location: [facility];  Service: Orthopedics;  Laterality: Left;  . Tee without cardioversion N/A 01/09/2016     Procedure: TRANSESOPHAGEAL ECHOCARDIOGRAM (TEE);  Surgeon: Dorothy Spark, MD;  Location: [facility];  Service: Cardiovascular;  Laterality: N/A;  . Tee without cardioversion N/A 01/15/2016    Procedure: TRANSESOPHAGEAL ECHOCARDIOGRAM (TEE);  Surgeon: Larey Dresser, MD;  Location: [facility] ENDOSCOPY;  Service: Cardiovascular;  Laterality: N/A;    Social History   Social History  . Marital Status: Widowed    Spouse Name: N/A  . Number of Children: 3  . Years of Education: N/A   Occupational History  . retired-truck driver    Social History Main Topics  . Smoking status: Former Smoker -- 1.00 packs/day for 50 years    Types: Cigarettes    Quit date: 10/20/1993  . Smokeless tobacco: Never Used  . Alcohol Use: 0.6 oz/week    1 Cans of beer, 0 Standard drinks or equivalent per week     Comment: occasional  . Drug Use: No  . Sexual Activity: No   Other Topics Concern  . Not on file   Social History Narrative   Caffeine: 2-3 cups coffee   Lives with son   Retired Educational psychologist, lives with 2 sons, 1 daughter in [place]   Activity: no regular exercise 2/2 hip pain and SOB.    Family History  Problem Relation Age of Onset  . Heart attack Father 17  . Breast cancer Mother 56  . Brain cancer Sister   . Prostate cancer Brother   . Diabetes Neg Hx   . Stroke Neg Hx     ROS: no fevers or chills, productive cough, hemoptysis, dysphasia, odynophagia, melena, hematochezia, dysuria, hematuria, rash, seizure activity, orthopnea, PND, pedal edema, claudication. Remaining systems are negative.  Physical Exam: Well-developed well-nourished in no acute distress.  Skin is warm and dry.  HEENT is normal.  Neck is supple.  Chest is clear to auscultation with normal expansion.  Cardiovascular exam is irregular Abdominal exam nontender or distended. No masses palpated. Extremities show no edema. neuro grossly intact  Atrial fibrillation at a rate of 87.Left anterior fascicular  block. Cannot rule out septal infarct. A/P  1 Atrial fibrillation-the patient remains in atrial fibrillation. CHADSvasc 7. Long discussion today concerning the risks and benefits of anticoagulation. He is somewhat unsteady but has not fallen. We will add apixaban 2.5 mg twice a day. His creatinine most recently has been 1.5 or higher. We will see him back in approximately 8 weeks and decide whether to pursue cardioversion. Continue Cardizem. Check hemoglobin and renal function in 4 weeks. Note I have elected not to begin Coumadin and instead treat with apixaban. He does have a history of valve disease but does not have a mechanical valve and his aortic valve was placed years ago. 2 Coronary artery disease-continue statin. Discontinue aspirin given need for anticoagulation. 3 hyperlipidemia-continue statin. 4 status post aortic valve replacement with recent endocarditis-he has been treated with antibiotics with resolution of his symptoms. 5 hypertension-blood pressure controlled. Continue present  medications. 6 Carotid artery disease-Continue statin.  Kirk Ruths, MD

## 2016-04-30 ENCOUNTER — Encounter: Payer: Self-pay | Admitting: Cardiology

## 2016-04-30 ENCOUNTER — Ambulatory Visit (INDEPENDENT_AMBULATORY_CARE_PROVIDER_SITE_OTHER): Payer: Medicare Other | Admitting: Cardiology

## 2016-04-30 VITALS — BP 136/76 | HR 90 | Ht 69.0 in | Wt 193.0 lb

## 2016-04-30 DIAGNOSIS — Z951 Presence of aortocoronary bypass graft: Secondary | ICD-10-CM

## 2016-04-30 DIAGNOSIS — Z954 Presence of other heart-valve replacement: Secondary | ICD-10-CM

## 2016-04-30 DIAGNOSIS — I480 Paroxysmal atrial fibrillation: Secondary | ICD-10-CM

## 2016-04-30 DIAGNOSIS — I10 Essential (primary) hypertension: Secondary | ICD-10-CM | POA: Diagnosis not present

## 2016-04-30 DIAGNOSIS — I330 Acute and subacute infective endocarditis: Secondary | ICD-10-CM

## 2016-04-30 DIAGNOSIS — I6330 Cerebral infarction due to thrombosis of unspecified cerebral artery: Secondary | ICD-10-CM

## 2016-04-30 DIAGNOSIS — I679 Cerebrovascular disease, unspecified: Secondary | ICD-10-CM | POA: Diagnosis not present

## 2016-04-30 DIAGNOSIS — Z952 Presence of prosthetic heart valve: Secondary | ICD-10-CM

## 2016-04-30 DIAGNOSIS — E7800 Pure hypercholesterolemia, unspecified: Secondary | ICD-10-CM | POA: Diagnosis not present

## 2016-04-30 MED ORDER — APIXABAN 2.5 MG PO TABS: 2.5000 mg | ORAL_TABLET | Freq: Two times a day (BID) | ORAL | Status: DC

## 2016-04-30 NOTE — Patient Instructions (Signed)
Medications  STOP Aspirin START Apixaban (Eliquis) 2.5 mg twice daily.   Lab work  Return for lab work in 4 weeks.    Follow-up in 8 weeks with Dr. Stanford Breed.

## 2016-05-01 ENCOUNTER — Ambulatory Visit: Payer: Medicare Other | Admitting: Family Medicine

## 2016-05-01 ENCOUNTER — Telehealth: Payer: Self-pay | Admitting: *Deleted

## 2016-05-01 NOTE — Telephone Encounter (Signed)
Prior authorization for eliquis faxed to the company

## 2016-05-06 NOTE — Telephone Encounter (Signed)
eliquis has been approved for one year. 

## 2016-05-09 ENCOUNTER — Ambulatory Visit (INDEPENDENT_AMBULATORY_CARE_PROVIDER_SITE_OTHER): Payer: Medicare Other | Admitting: Family Medicine

## 2016-05-09 ENCOUNTER — Encounter: Payer: Self-pay | Admitting: Family Medicine

## 2016-05-09 VITALS — BP 138/74 | HR 84 | Temp 98.1°F | Wt 194.2 lb

## 2016-05-09 DIAGNOSIS — E1140 Type 2 diabetes mellitus with diabetic neuropathy, unspecified: Secondary | ICD-10-CM | POA: Diagnosis not present

## 2016-05-09 DIAGNOSIS — I639 Cerebral infarction, unspecified: Secondary | ICD-10-CM

## 2016-05-09 DIAGNOSIS — I10 Essential (primary) hypertension: Secondary | ICD-10-CM

## 2016-05-09 DIAGNOSIS — I6330 Cerebral infarction due to thrombosis of unspecified cerebral artery: Secondary | ICD-10-CM | POA: Diagnosis not present

## 2016-05-09 DIAGNOSIS — R202 Paresthesia of skin: Secondary | ICD-10-CM

## 2016-05-09 DIAGNOSIS — E7800 Pure hypercholesterolemia, unspecified: Secondary | ICD-10-CM | POA: Diagnosis not present

## 2016-05-09 DIAGNOSIS — I480 Paroxysmal atrial fibrillation: Secondary | ICD-10-CM

## 2016-05-09 DIAGNOSIS — D649 Anemia, unspecified: Secondary | ICD-10-CM

## 2016-05-09 DIAGNOSIS — E1142 Type 2 diabetes mellitus with diabetic polyneuropathy: Secondary | ICD-10-CM

## 2016-05-09 DIAGNOSIS — I679 Cerebrovascular disease, unspecified: Secondary | ICD-10-CM

## 2016-05-09 NOTE — Progress Notes (Signed)
BP 138/74   Pulse 84 Comment: Irregular  Temp 98.1 F (36.7 C) (Oral)   Wt 194 lb 4 oz (88.1 kg)   BMI 28.69 kg/m     CC: 24mo f/u visit  Subjective:    Patient ID: Eric [name], male    DOB: 12-06-1926, 82 y.o.   MRN: [id number]  HPI: Eric [name] is a 82 y.o. male presenting on 05/09/2016 for Follow-up   Here with daughter today. In good spirits. Lives with son. Walks with walker at home, and cane out of home.   Lab Results  Component Value Date   HGBA1C 7.1 (H) 12/25/2015  will return for labs - requests labs drawn with Dr Jacalyn Lefevre upcoming labs.  Bowels regular. Iron caused constipation, now off this.  Overall continent of bladder.   Off lasix, only on diltiazem 30/60mg .  Not regularly checking sugars. Compliant with lantus and glipizide, denies significant hypoglycemia.   No dyspnea, fevers, cough. Denies significant leg swelling.  Relevant past medical, surgical, family and social history reviewed and updated as indicated. Interim medical history since our last visit reviewed. Allergies and medications reviewed and updated. Current Outpatient Prescriptions on File Prior to Visit  Medication Sig  . apixaban (ELIQUIS) 2.5 MG TABS tablet Take 1 tablet (2.5 mg total) by mouth 2 (two) times daily.  Marland Kitchen atorvastatin (LIPITOR) 20 MG tablet Take 1 tablet (20 mg total) by mouth daily at 6 PM.  . diltiazem (CARDIZEM) 60 MG tablet Take 1/2 tablet (30mg ) in am and 1 tablet (60mg ) in pm. Hold if HR < 60/min SBP < 110  . FAMOTIDINE PO Take 1 tablet by mouth daily with breakfast.  . glipiZIDE (GLUCOTROL) 10 MG tablet Take 10 mg by mouth daily before breakfast.  . insulin glargine (LANTUS) 100 UNIT/ML injection Check CBG. Inject 30 units sub q daily at bedtime  . nitroGLYCERIN (NITROSTAT) 0.4 MG SL tablet Place 0.4 mg under the tongue every 5 (five) minutes as needed. Call MD/NP if pain unrelieved.   No current facility-administered medications on file prior to visit.      Review of Systems Per HPI unless specifically indicated in ROS section     Objective:    BP 138/74   Pulse 84 Comment: Irregular  Temp 98.1 F (36.7 C) (Oral)   Wt 194 lb 4 oz (88.1 kg)   BMI 28.69 kg/m    Wt Readings from Last 3 Encounters:  05/09/16 194 lb 4 oz (88.1 kg)  04/30/16 193 lb (87.5 kg)  03/25/16 190 lb 12.8 oz (86.5 kg)    Physical Exam  Constitutional: He appears well-developed and well-nourished. No distress.  HENT:  Hard of hearing  Cardiovascular: Normal rate, regular rhythm, normal heart sounds and intact distal pulses.   No murmur heard. Pulmonary/Chest: Effort normal and breath sounds normal. No respiratory distress. He has no wheezes. He has no rales.  Musculoskeletal: He exhibits no edema.  Skin: Skin is warm and dry. No rash noted.  Psychiatric: He has a normal mood and affect.  Nursing note and vitals reviewed.  Results for orders placed or performed in visit on 03/27/16  CBC  Result Value Ref Range   WBC 8.7    HGB 8.6 g/dL   platelet count 156       Assessment & Plan:   Problem List Items Addressed This Visit    Anemia, unspecified    Check CBC and further eval anemia with labs.  Has not completed iFOB yet.  Relevant Orders   CBC with Differential/Platelet   Ferritin   Vitamin B12   IBC panel   Folate   Cerebrovascular disease    Continue aspirin/statin      CVA (cerebral infarction)    Now on eliquis. Continue atorvastatin.       Diabetic neuropathy associated with type 2 diabetes mellitus ([place])    Stable off treatment.       Relevant Orders   Vitamin B12   Folate   Essential hypertension - Primary    Chronic, stable. Hypotension better on current regimen of dilt 30/60mg  daily.      Relevant Orders   Basic metabolic panel   HYPERCHOLESTEROLEMIA    Continue atorvastatin. Holding this did not help myalgias.       Paroxysmal atrial fibrillation ([name]    Continue apixiban (Eliquis) - this was recently  started.       Type 2 diabetes, controlled, with neuropathy ([name]    Check A1c with upcoming labs.       Relevant Orders   Vitamin B12   Folate   Hemoglobin A1c    Other Visit Diagnoses    Paresthesia       Relevant Orders   Vitamin B12   Folate       Follow up plan: Return in about 3 months (around 08/09/2016), or as needed, for follow up visit.  Eric Bush, MD

## 2016-05-09 NOTE — Patient Instructions (Addendum)
I will add labs for your upcoming lab visit to draw together with Dr Jacalyn Lefevre labs. Watch weight and leg swelling or shortness of breath - if noticing these changes let us know to start water pill again.  You are doing well today.  Return in 3 months for follow up - sooner if needed.

## 2016-05-11 ENCOUNTER — Encounter: Payer: Self-pay | Admitting: Family Medicine

## 2016-05-11 NOTE — Assessment & Plan Note (Signed)
Stable off treatment.  

## 2016-05-11 NOTE — Assessment & Plan Note (Addendum)
Continue apixiban (Eliquis) - this was recently started.

## 2016-05-11 NOTE — Assessment & Plan Note (Signed)
Continue atorvastatin. Holding this did not help myalgias.

## 2016-05-11 NOTE — Assessment & Plan Note (Addendum)
Now on eliquis. Continue atorvastatin.

## 2016-05-11 NOTE — Assessment & Plan Note (Addendum)
Check CBC and further eval anemia with labs.  Has not completed iFOB yet.

## 2016-05-11 NOTE — Assessment & Plan Note (Signed)
Continue aspirin/statin. 

## 2016-05-11 NOTE — Assessment & Plan Note (Signed)
Check A1c with upcoming labs. 

## 2016-05-11 NOTE — Assessment & Plan Note (Signed)
Chronic, stable. Hypotension better on current regimen of dilt 30/60mg  daily.

## 2016-05-28 ENCOUNTER — Other Ambulatory Visit: Payer: Medicare Other

## 2016-05-29 ENCOUNTER — Other Ambulatory Visit: Payer: Self-pay | Admitting: *Deleted

## 2016-05-29 ENCOUNTER — Other Ambulatory Visit (INDEPENDENT_AMBULATORY_CARE_PROVIDER_SITE_OTHER): Payer: Medicare Other

## 2016-05-29 DIAGNOSIS — E1142 Type 2 diabetes mellitus with diabetic polyneuropathy: Secondary | ICD-10-CM | POA: Diagnosis not present

## 2016-05-29 DIAGNOSIS — E1140 Type 2 diabetes mellitus with diabetic neuropathy, unspecified: Secondary | ICD-10-CM

## 2016-05-29 DIAGNOSIS — D649 Anemia, unspecified: Secondary | ICD-10-CM | POA: Diagnosis not present

## 2016-05-29 DIAGNOSIS — I10 Essential (primary) hypertension: Secondary | ICD-10-CM | POA: Diagnosis not present

## 2016-05-29 DIAGNOSIS — R202 Paresthesia of skin: Secondary | ICD-10-CM

## 2016-05-29 LAB — FERRITIN: FERRITIN: 89.9 ng/mL (ref 22.0–322.0)

## 2016-05-29 LAB — CBC WITH DIFFERENTIAL/PLATELET
BASOS ABS: 0 10*3/uL (ref 0.0–0.1)
Basophils Relative: 0.3 % (ref 0.0–3.0)
EOS ABS: 0.2 10*3/uL (ref 0.0–0.7)
Eosinophils Relative: 2.6 % (ref 0.0–5.0)
HEMATOCRIT: 35.6 % — AB (ref 39.0–52.0)
HEMOGLOBIN: 11.8 g/dL — AB (ref 13.0–17.0)
LYMPHS PCT: 14.2 % (ref 12.0–46.0)
Lymphs Abs: 1 10*3/uL (ref 0.7–4.0)
MCHC: 33.2 g/dL (ref 30.0–36.0)
MCV: 85.8 fl (ref 78.0–100.0)
Monocytes Absolute: 0.6 10*3/uL (ref 0.1–1.0)
Monocytes Relative: 8.9 % (ref 3.0–12.0)
Neutro Abs: 5 10*3/uL (ref 1.4–7.7)
Neutrophils Relative %: 74 % (ref 43.0–77.0)
PLATELETS: 202 10*3/uL (ref 150.0–400.0)
RBC: 4.15 Mil/uL — ABNORMAL LOW (ref 4.22–5.81)
RDW: 15.5 % (ref 11.5–15.5)
WBC: 6.7 10*3/uL (ref 4.0–10.5)

## 2016-05-29 LAB — BASIC METABOLIC PANEL
BUN: 17 mg/dL (ref 6–23)
CHLORIDE: 104 meq/L (ref 96–112)
CO2: 27 mEq/L (ref 19–32)
Calcium: 9.2 mg/dL (ref 8.4–10.5)
Creatinine, Ser: 1.6 mg/dL — ABNORMAL HIGH (ref 0.40–1.50)
GFR: 43.62 mL/min — ABNORMAL LOW (ref >60.00)
GLUCOSE: 164 mg/dL — AB (ref 70–99)
POTASSIUM: 4.4 meq/L (ref 3.5–5.1)
SODIUM: 138 meq/L (ref 135–145)

## 2016-05-29 LAB — FOLATE: Folate: >23.7 ng/mL (ref >5.9)

## 2016-05-29 LAB — VITAMIN B12: Vitamin B-12: 194 pg/mL — ABNORMAL LOW (ref 211–911)

## 2016-05-29 LAB — IBC PANEL
Iron: 38 ug/dL — ABNORMAL LOW (ref 42–165)
Saturation Ratios: 9.7 % — ABNORMAL LOW (ref 20.0–50.0)
Transferrin: 281 mg/dL (ref 212.0–360.0)

## 2016-05-29 LAB — HEMOGLOBIN A1C: Hgb A1c MFr Bld: 6.9 % — ABNORMAL HIGH (ref 4.6–6.5)

## 2016-05-29 MED ORDER — FAMOTIDINE 20 MG PO TABS: 20.0000 mg | ORAL_TABLET | Freq: Every day | ORAL | 3 refills | Status: DC

## 2016-05-29 MED ORDER — GLIPIZIDE 10 MG PO TABS: 10.0000 mg | ORAL_TABLET | Freq: Every day | ORAL | 3 refills | Status: DC

## 2016-06-02 ENCOUNTER — Other Ambulatory Visit: Payer: Self-pay | Admitting: Family Medicine

## 2016-06-02 DIAGNOSIS — D509 Iron deficiency anemia, unspecified: Secondary | ICD-10-CM

## 2016-06-02 DIAGNOSIS — E538 Deficiency of other specified B group vitamins: Secondary | ICD-10-CM | POA: Insufficient documentation

## 2016-06-02 MED ORDER — VITAMIN B-12 1000 MCG PO TABS: 1000.0000 ug | ORAL_TABLET | Freq: Every day | ORAL | Status: DC

## 2016-06-02 MED ORDER — FERROUS SULFATE 325 (65 FE) MG PO TABS: 325.0000 mg | ORAL_TABLET | Freq: Every day | ORAL | Status: DC

## 2016-06-09 ENCOUNTER — Other Ambulatory Visit: Payer: Self-pay | Admitting: *Deleted

## 2016-06-24 NOTE — Progress Notes (Signed)
HPI: FU AVR and coronary artery disease. His previous workup showed three-vessel coronary disease and severe aortic stenosis. The patient therefore on December 22 2008, underwent aortic valve replacement with #25-mm [facility] Ease pericardial tissue valve as well as coronary artery bypassing graft with a LIMA to the LAD, saphenous vein graft to the circ marginal and the saphenous vein graft to the PDA. Patient seen in the hospital March 2017 with new-onset atrial fibrillation, pneumonia and endocarditis. He received therapy for his endocarditis with antibiotics. Carotid Dopplers in March 2017 showed 1-39 bilateral stenosis. Transesophageal echocardiogram March 2017 showed normal LV systolic function, bioprosthetic aortic valve, moderate to severe mitral annular calcification with mild mitral stenosis, moderate mitral regurgitation and a 2 cm vegetation attached to the posterior mitral valve leaflet. Mild biatrial enlargement. Abdominal CT April 2017 showed abdominal aortic aneurysm 3.2 cm. Last echocardiogram May 2017 showed normal LV systolic function, mitral annular calcification with mild mitral stenosis and mild mitral regurgitation, mild left atrial enlargement and mild tricuspid regurgitation. Since I last saw him, Patient has dyspnea with more extreme activities but not routine activities. No orthopnea, PND, pedal edema, exertional chest pain, syncope or bleeding.  Current Outpatient Prescriptions  Medication Sig Dispense Refill  . apixaban (ELIQUIS) 2.5 MG TABS tablet Take 1 tablet (2.5 mg total) by mouth 2 (two) times daily. 180 tablet 3  . atorvastatin (LIPITOR) 20 MG tablet Take 1 tablet (20 mg total) by mouth daily at 6 PM. 90 tablet 1  . diltiazem (CARDIZEM) 60 MG tablet Take 1/2 tablet (30mg ) in am and 1 tablet (60mg ) in pm. Hold if HR < 60/min SBP < 110 60 tablet 3  . famotidine (PEPCID) 20 MG tablet Take 1 tablet (20 mg total) by mouth daily. 90 tablet 3  . ferrous sulfate 325 (65  FE) MG tablet Take 1 tablet (325 mg total) by mouth daily with breakfast.    . glipiZIDE (GLUCOTROL) 10 MG tablet Take 1 tablet (10 mg total) by mouth daily before breakfast. 90 tablet 3  . insulin glargine (LANTUS) 100 UNIT/ML injection Check CBG. Inject 30 units sub q daily at bedtime    . nitroGLYCERIN (NITROSTAT) 0.4 MG SL tablet Place 0.4 mg under the tongue every 5 (five) minutes as needed. Call MD/NP if pain unrelieved.    . vitamin B-12 (CYANOCOBALAMIN) 1000 MCG tablet Take 1 tablet (1,000 mcg total) by mouth daily.     No current facility-administered medications for this visit.      Past Medical History:  Diagnosis Date  . Acute endocarditis   . Bilateral hydrocele 3/11   [facility]  . Cerebrovascular disease, unspecified   . Cervicalgia   . Colon polyp   . Coronary atherosclerosis of unspecified type of vessel, native or graft    s/p CABG 2010  . Esophageal reflux   . Essential hypertension, benign   . Foraminal stenosis of cervical region    bilateral-C4-C5, C5-C6  . History of aortic stenosis    s/p valve replacment 2010  . Pure hypercholesterolemia   . Shingles 07/27/2012  . Stroke ([place])   . Type II or unspecified type diabetes mellitus with neurological manifestations, not stated as uncontrolled   . 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 AORTIC VALVE REPLACEMENT  12/2008   with pericardial tissue valve  . Carotid US  10/2009   B  ICA stenosis, stable disease, rec rpt 2 years  . CATARACT EXTRACTION  1990's   bilateral  . CHOLECYSTECTOMY  1995  . COLONOSCOPY  [place]  . CORONARY ARTERY BYPASS GRAFT  3/10   x3-using a left internal mammary artery to the left anterior descending coronary artery, saphenous vein graft to circumflex marginal branch, spahenous vein graft  to posterior descendingcoronary artery. Endoscopic saphenous  vein harvest from bilateral thighs was done.   Marland Kitchen HYDROCELE EXCISION     bilateral ([facility])  . L leg trauma  1957   truck over left leg  . PTCA  12/99   with stent  . sphincterectomy  09/20/03   for jaundice  . TEE WITHOUT CARDIOVERSION N/A 01/09/2016   Procedure: TRANSESOPHAGEAL ECHOCARDIOGRAM (TEE);  Surgeon: Dorothy Spark, MD;  Location: [place];  Service: Cardiovascular;  Laterality: N/A;  . TEE WITHOUT CARDIOVERSION N/A 01/15/2016   Procedure: TRANSESOPHAGEAL ECHOCARDIOGRAM (TEE);  Surgeon: Larey Dresser, MD;  Location: [place];  Service: Cardiovascular;  Laterality: N/A;  . TOTAL HIP ARTHROPLASTY Left 06/30/2014   Procedure: LEFT TOTAL HIP ARTHROPLASTY ANTERIOR APPROACH;  Surgeon: Mcarthur Rossetti, MD;  Location: [facility];  Service: Orthopedics;  Laterality: Left;    Social History   Social History  . Marital status: Widowed    Spouse name: N/A  . Number of children: 3  . Years of education: N/A   Occupational History  . retired-truck driver    Social History Main Topics  . Smoking status: Former Smoker    Packs/day: 1.00    Years: 50.00    Types: Cigarettes    Quit date: 10/20/1993  . Smokeless tobacco: Never Used  . Alcohol use 0.6 oz/week    1 Cans of beer per week     Comment: occasional  . Drug use: No  . Sexual activity: No   Other Topics Concern  . Not on file   Social History Narrative   Caffeine: 2-3 cups coffee   Lives with son   Retired Educational psychologist, lives with 2 sons, 1 daughter in [place]   Activity: no regular exercise 2/2 hip pain and SOB.    Family History  Problem Relation Age of Onset  . Heart attack Father 19  . Breast cancer Mother 47  . Brain cancer Sister   . Prostate cancer Brother   . Diabetes Neg Hx   . Stroke Neg Hx     ROS: Unsteady gait but no fevers or chills, productive cough, hemoptysis, dysphasia, odynophagia, melena, hematochezia, dysuria, hematuria, rash, seizure activity,  orthopnea, PND, pedal edema, claudication. Remaining systems are negative.  Physical Exam: Well-developed well-nourished in no acute distress.  Skin is warm and dry.  HEENT is normal.  Neck is supple.  Chest is clear to auscultation with normal expansion.  Cardiovascular exam is irregular Abdominal exam nontender or distended. No masses palpated. Extremities show no edema. neuro grossly intact  ECG  A/P  1 Atrial fibrillation-the patient remains in atrial fibrillation on examination. I do not think he is symptomatic. Plan rate control and anticoagulation. Schedule 24-hour Holter monitor to make sure that rate is controlled. Continue apixaban. He is somewhat unsteady but he does not fall.  2 Coronary artery disease-continue statin. No aspirin given need for anticoagulation.  3 hyperlipidemia-continue statin.   4 Prior aortic valve replacement-he has had no recurrent symptoms of endocarditis. Continue SBE prophylaxis.  5 hypertension-blood pressure controlled. Continue present medications.  6 carotid artery disease-continue statin.  Kirk Ruths, MD

## 2016-06-26 ENCOUNTER — Ambulatory Visit (INDEPENDENT_AMBULATORY_CARE_PROVIDER_SITE_OTHER): Payer: Medicare Other | Admitting: Cardiology

## 2016-06-26 ENCOUNTER — Encounter: Payer: Self-pay | Admitting: Cardiology

## 2016-06-26 VITALS — BP 134/80 | HR 86 | Ht 69.0 in | Wt 196.0 lb

## 2016-06-26 DIAGNOSIS — Z951 Presence of aortocoronary bypass graft: Secondary | ICD-10-CM | POA: Diagnosis not present

## 2016-06-26 DIAGNOSIS — I10 Essential (primary) hypertension: Secondary | ICD-10-CM | POA: Diagnosis not present

## 2016-06-26 DIAGNOSIS — Z952 Presence of prosthetic heart valve: Secondary | ICD-10-CM

## 2016-06-26 DIAGNOSIS — I4891 Unspecified atrial fibrillation: Secondary | ICD-10-CM

## 2016-06-26 DIAGNOSIS — I6330 Cerebral infarction due to thrombosis of unspecified cerebral artery: Secondary | ICD-10-CM

## 2016-06-26 DIAGNOSIS — Z954 Presence of other heart-valve replacement: Secondary | ICD-10-CM | POA: Diagnosis not present

## 2016-06-26 NOTE — Patient Instructions (Signed)
Medication Instructions:  NO CHANGES.   Testing/Procedures: Your physician has recommended that you wear a 24 HOUR holter monitor. Holter monitors are medical devices that record the heart's electrical activity. Doctors most often use these monitors to diagnose arrhythmias. Arrhythmias are problems with the speed or rhythm of the heartbeat. The monitor is a small, portable device. You can wear one while you do your normal daily activities. This is usually used to diagnose what is causing palpitations/syncope (passing out).   Follow-Up: Your physician wants you to follow-up in: [place]. You will receive a reminder letter in the mail two months in advance. If you don't receive a letter, please call our office to schedule the follow-up appointment.   If you need a refill on your cardiac medications before your next appointment, please call your pharmacy.

## 2016-06-27 ENCOUNTER — Ambulatory Visit (INDEPENDENT_AMBULATORY_CARE_PROVIDER_SITE_OTHER): Payer: Medicare Other | Admitting: Family Medicine

## 2016-06-27 ENCOUNTER — Encounter: Payer: Self-pay | Admitting: Family Medicine

## 2016-06-27 VITALS — BP 98/60 | HR 85

## 2016-06-27 DIAGNOSIS — S51011A Laceration without foreign body of right elbow, initial encounter: Secondary | ICD-10-CM | POA: Diagnosis not present

## 2016-06-27 DIAGNOSIS — I10 Essential (primary) hypertension: Secondary | ICD-10-CM | POA: Diagnosis not present

## 2016-06-27 DIAGNOSIS — I6330 Cerebral infarction due to thrombosis of unspecified cerebral artery: Secondary | ICD-10-CM

## 2016-06-27 MED ORDER — DILTIAZEM HCL 60 MG PO TABS: ORAL_TABLET | ORAL | 1 refills | Status: DC

## 2016-06-27 NOTE — Progress Notes (Signed)
Subjective:    Patient ID: Eric [name], male    DOB: 05-26-1927, 82 y.o.   MRN: [id number]  HPI This is a pleasant 82 yo male, brought in by his son, who presents today following a fall that occurred about an hour ago. He fell backward and hit his right elbow on his storm door. The fall was caused by a slip. He denies loss of consciousness, no headache, no dizziness or lightheadedness, no visual changes. No hip or back pain.   Patient requests refill of his diltizem to be sent to his mail order pharmacy. Does not check blood pressure at home.   Past Medical History:  Diagnosis Date  . Acute endocarditis   . Bilateral hydrocele 3/11   [facility]  . Cerebrovascular disease, unspecified   . Cervicalgia   . Colon polyp   . Coronary atherosclerosis of unspecified type of vessel, native or graft    s/p CABG 2010  . Esophageal reflux   . Essential hypertension, benign   . Foraminal stenosis of cervical region    bilateral-C4-C5, C5-C6  . History of aortic stenosis    s/p valve replacment 2010  . Pure hypercholesterolemia   . Shingles 07/27/2012  . Stroke ([place])   . Type II or unspecified type diabetes mellitus with neurological manifestations, not stated as uncontrolled   . 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 AORTIC VALVE REPLACEMENT  12/2008   with pericardial tissue valve  . Carotid US  10/2009   B ICA stenosis, stable disease, rec rpt 2 years  . CATARACT EXTRACTION  1990's   bilateral  . CHOLECYSTECTOMY  1995  . COLONOSCOPY  [place]  . CORONARY ARTERY BYPASS GRAFT  3/10   x3-using a left internal mammary artery to the left anterior descending coronary artery, saphenous vein graft to circumflex marginal branch, spahenous vein graft  to posterior descendingcoronary artery. Endoscopic saphenous vein harvest from bilateral thighs was done.   Marland Kitchen  HYDROCELE EXCISION     bilateral ([facility])  . L leg trauma  1957   truck over left leg  . PTCA  12/99   with stent  . sphincterectomy  09/20/03   for jaundice  . TEE WITHOUT CARDIOVERSION N/A 01/09/2016   Procedure: TRANSESOPHAGEAL ECHOCARDIOGRAM (TEE);  Surgeon: Dorothy Spark, MD;  Location: [place];  Service: Cardiovascular;  Laterality: N/A;  . TEE WITHOUT CARDIOVERSION N/A 01/15/2016   Procedure: TRANSESOPHAGEAL ECHOCARDIOGRAM (TEE);  Surgeon: Larey Dresser, MD;  Location: [place];  Service: Cardiovascular;  Laterality: N/A;  . TOTAL HIP ARTHROPLASTY Left 06/30/2014   Procedure: LEFT TOTAL HIP ARTHROPLASTY ANTERIOR APPROACH;  Surgeon: Mcarthur Rossetti, MD;  Location: [facility];  Service: Orthopedics;  Laterality: Left;   Family History  Problem Relation Age of Onset  . Heart attack Father 89  . Breast cancer Mother 64  . Brain cancer Sister   . Prostate cancer Brother   . Diabetes Neg Hx   . Stroke Neg Hx    Social History  Substance Use Topics  . Smoking status: Former Smoker    Packs/day: 1.00    Years: 50.00    Types: Cigarettes    Quit date: 10/20/1993  . Smokeless tobacco: Never Used  . Alcohol use 0.6 oz/week    1 Cans of beer per week     Comment: occasional  Review of Systems Per HPI    Objective:   Physical Exam  Constitutional: He is oriented to person, place, and time. He appears well-developed and well-nourished. No distress.  HENT:  Head: Normocephalic and atraumatic.  Eyes: Conjunctivae are normal.  Cardiovascular: Normal rate.   Pulmonary/Chest: Effort normal.  Neurological: He is alert and oriented to person, place, and time.  Skin: Skin is warm and dry. He is not diaphoretic.  Right elbow with two superficial skin tears. Distal lesion with 5 cm elliptical skin tear, mild bleeding. Proximal skin tear linear, 1.5 cm. Area cleaned with soap and water, dried, bleeding stopped. Steri-strips applied, telfa non adhesive  bandage applied and area wrapped with guaze.   Psychiatric: He has a normal mood and affect. His behavior is normal. Judgment and thought content normal.  Vitals reviewed.     BP 98/60   Pulse 85   SpO2 93%  Wt Readings from Last 3 Encounters:  06/26/16 196 lb (88.9 kg)  05/09/16 194 lb 4 oz (88.1 kg)  04/30/16 193 lb (87.5 kg)       Assessment & Plan:  1. Skin tear of elbow without complication, right, initial encounter - provided written and verbal wound care instructions - RTC precautions reviewed  2. Essential hypertension -  Meds ordered this encounter  Medications  . diltiazem (CARDIZEM) 60 MG tablet    Sig: Take 1/2 tablet (30mg ) in am and 1 tablet (60mg ) in pm. Hold if HR < 60/min SBP < 110    Dispense:  135 tablet    Refill:  1    **NOTE SIG CHANGE** Use this dose    Order Specific Question:   Supervising Provider    Answer:   Tonia Ghent [[id number]]     Clarene Reamer, FNP-BC  [facility], [place]  06/27/2016 5:31 PM

## 2016-06-27 NOTE — Patient Instructions (Signed)
Keep bandage on for 24 hours After 24 hours, remove bandage and gently wash with soap and water, leave steri strip tapes on until they fall off by themselves If the area becomes infected (red, hot, yellow/green drainage) or starts bleeding and won't stop- please let us know or go to the emergency room

## 2016-07-02 ENCOUNTER — Telehealth: Payer: Self-pay

## 2016-07-02 NOTE — Telephone Encounter (Signed)
Pt wants to know status of diltiazem refill for [facility]. Called [phone] spoke with [place] and rx was shipped on 07/01/16. Pt voiced understanding; pt is out of med but does not want med called to local pharmacy. Pt said would wait on mail order.

## 2016-07-03 ENCOUNTER — Ambulatory Visit (INDEPENDENT_AMBULATORY_CARE_PROVIDER_SITE_OTHER): Payer: Medicare Other

## 2016-07-03 DIAGNOSIS — I4891 Unspecified atrial fibrillation: Secondary | ICD-10-CM

## 2016-08-01 ENCOUNTER — Other Ambulatory Visit: Payer: Self-pay

## 2016-08-01 MED ORDER — "INSULIN SYRINGE-NEEDLE U-100 30G X 1/2"" 1 ML MISC": 5 refills | Status: DC

## 2016-08-01 NOTE — Telephone Encounter (Signed)
Pt left note requesting refills on glipizide and insulin syringes BD ultra fine 1 ml 30 G 1/2 ". Advised pt , pts son and Jerold Coombe that [facility] and I will send in insulin syringes. [place] and pt voiced understanding. Seen 05/09/16

## 2016-08-08 ENCOUNTER — Telehealth: Payer: Self-pay

## 2016-08-08 NOTE — Telephone Encounter (Signed)
[facility] said [facility] not [facility] gl[facility]ide 5 mg to pt. Pt asked [facility] to [facility]; [facility] and she said glipizide 10 mg was shipped on 05/30/16; pt is on auto refill and glipizide 10 mg will be shipped again on 08/10/16. Pt said he was taking glipizide 5 mg. Glipizide 5 mg was changed to 10 mg when pt d/c from hospital 01/17/16. Pt will go home and look for glipizide 10 mg to take one daily po. If needed pt will cb [facility]. Pt will keep appt with Dr Darnell Level on 08/12/16 at 4 PM. FYI to Dr Darnell Level.

## 2016-08-12 ENCOUNTER — Encounter: Payer: Self-pay | Admitting: Family Medicine

## 2016-08-12 ENCOUNTER — Ambulatory Visit (INDEPENDENT_AMBULATORY_CARE_PROVIDER_SITE_OTHER): Payer: Medicare Other | Admitting: Family Medicine

## 2016-08-12 VITALS — BP 126/68 | HR 88 | Temp 97.7°F | Wt 197.5 lb

## 2016-08-12 DIAGNOSIS — I6330 Cerebral infarction due to thrombosis of unspecified cerebral artery: Secondary | ICD-10-CM

## 2016-08-12 DIAGNOSIS — I420 Dilated cardiomyopathy: Secondary | ICD-10-CM

## 2016-08-12 DIAGNOSIS — N183 Chronic kidney disease, stage 3 unspecified: Secondary | ICD-10-CM

## 2016-08-12 DIAGNOSIS — D509 Iron deficiency anemia, unspecified: Secondary | ICD-10-CM | POA: Diagnosis not present

## 2016-08-12 DIAGNOSIS — I639 Cerebral infarction, unspecified: Secondary | ICD-10-CM

## 2016-08-12 DIAGNOSIS — E1140 Type 2 diabetes mellitus with diabetic neuropathy, unspecified: Secondary | ICD-10-CM

## 2016-08-12 DIAGNOSIS — H6191 Disorder of right external ear, unspecified: Secondary | ICD-10-CM

## 2016-08-12 DIAGNOSIS — E538 Deficiency of other specified B group vitamins: Secondary | ICD-10-CM | POA: Diagnosis not present

## 2016-08-12 NOTE — Assessment & Plan Note (Signed)
Recurrent, rec f/u with dentist to r/o basal cell cancer.

## 2016-08-12 NOTE — Assessment & Plan Note (Signed)
Pt states he does not tolerate supplemental iron. Discussed iron rich foods, will mail handout.

## 2016-08-12 NOTE — Assessment & Plan Note (Signed)
Pt to restart glipizide, continue lantus. No recent hypoglycemia.

## 2016-08-12 NOTE — Assessment & Plan Note (Signed)
New - pt declines B12 shots. Will start 1000 mcg daily.

## 2016-08-12 NOTE — Patient Instructions (Addendum)
Return in 6 months for physical Increase iron rich foods - handout provided today. Start vitamin B12 1000 mcg daily (over the counter) Make sure you're staying well hydrated. Return to see skin doctor for right ear.  Please ask to cancel MyChart account up front.  Good to see you today.

## 2016-08-12 NOTE — Assessment & Plan Note (Signed)
Stable. Check labs next visit.

## 2016-08-12 NOTE — Progress Notes (Signed)
BP 126/68   Pulse 88   Temp 97.7 F (36.5 C) (Oral)   Wt 197 lb 8 oz (89.6 kg)   BMI 29.17 kg/m    CC: 3 mo f/u visit Subjective:    Patient ID: Eric [name], male    DOB: 01/01/1927, 82 y.o.   MRN: [id number]  HPI: Eric [name] is a 82 y.o. male presenting on 08/12/2016 for Follow-up   Drove here alone today. Last seen 3 months ago.  Dictation #1 [id number]  [id number]  DM - Not regularly checking sugars. Compliant with lantus and glipizide, however he has run out of glip[facility]ide and [facility] not yet sent in new prescription. Told he should receive medicines today or tomorrow.   He never received prior lab results as he does not use MyChart.  Relevant past medical, surgical, family and social history reviewed and updated as indicated. Interim medical history since our last visit reviewed. Allergies and medications reviewed and updated. Current Outpatient Prescriptions on File Prior to Visit  Medication Sig  . apixaban (ELIQUIS) 2.5 MG TABS tablet Take 1 tablet (2.5 mg total) by mouth 2 (two) times daily.  Marland Kitchen atorvastatin (LIPITOR) 20 MG tablet Take 1 tablet (20 mg total) by mouth daily at 6 PM.  . diltiazem (CARDIZEM) 60 MG tablet Take 1/2 tablet (30mg ) in am and 1 tablet (60mg ) in pm. Hold if HR < 60/min SBP < 110  . famotidine (PEPCID) 20 MG tablet Take 1 tablet (20 mg total) by mouth daily.  . ferrous sulfate 325 (65 FE) MG tablet Take 1 tablet (325 mg total) by mouth daily with breakfast.  . insulin glargine (LANTUS) 100 UNIT/ML injection Check CBG. Inject 30 units sub q daily at bedtime  . Insulin Syringe-Needle U-100 (B-D INS SYR ULTRAFINE 1CC/30G) 30G X 1/2" 1 ML MISC Use when taking insulin.Dx E11.40  . glipiZIDE (GLUCOTROL) 10 MG tablet Take 1 tablet (10 mg total) by mouth daily before breakfast. (Patient not taking: Reported on 08/12/2016)  . nitroGLYCERIN (NITROSTAT) 0.4 MG SL tablet Place 0.4 mg under the tongue every 5 (five) minutes as needed.  Call MD/NP if pain unrelieved.  . vitamin B-12 (CYANOCOBALAMIN) 1000 MCG tablet Take 1 tablet (1,000 mcg total) by mouth daily.   No current facility-administered medications on file prior to visit.     Review of Systems Per HPI unless specifically indicated in ROS section     Objective:    BP 126/68   Pulse 88   Temp 97.7 F (36.5 C) (Oral)   Wt 197 lb 8 oz (89.6 kg)   BMI 29.17 kg/m   Wt Readings from Last 3 Encounters:  08/12/16 197 lb 8 oz (89.6 kg)  06/26/16 196 lb (88.9 kg)  05/09/16 194 lb 4 oz (88.1 kg)    Physical Exam  Constitutional: He appears well-developed and well-nourished. No distress.  Here alone today, walks with cane  HENT:  Head: Normocephalic and atraumatic.  Right Ear: Decreased hearing is noted.  Left Ear: Decreased hearing is noted.  Mouth/Throat: Oropharynx is clear and moist. No oropharyngeal exudate.  HOH Poorly healing ulcer at R posterior pinna  Cardiovascular: Normal rate, regular rhythm, normal heart sounds and intact distal pulses.   No murmur heard. Pulmonary/Chest: Effort normal and breath sounds normal. No respiratory distress. He has no wheezes. He has no rales.  Musculoskeletal: He exhibits no edema.  Neurological:  Very unsteady and slowed gait  Skin: Skin is warm and dry. No rash  noted.  Nursing note and vitals reviewed.  Results for orders placed or performed in visit on 05/29/16  CBC with Differential/Platelet  Result Value Ref Range   WBC 6.7 4.0 - 10.5 K/uL   RBC 4.15 (L) 4.22 - 5.81 Mil/uL   Hemoglobin 11.8 (L) 13.0 - 17.0 g/dL   HCT 35.6 (L) 39.0 - 52.0 %   MCV 85.8 78.0 - 100.0 fl   MCHC 33.2 30.0 - 36.0 g/dL   RDW 15.5 11.5 - 15.5 %   Platelets 202.0 150.0 - 400.0 K/uL   Neutrophils Relative % 74.0 43.0 - 77.0 %   Lymphocytes Relative 14.2 12.0 - 46.0 %   Monocytes Relative 8.9 3.0 - 12.0 %   Eosinophils Relative 2.6 0.0 - 5.0 %   Basophils Relative 0.3 0.0 - 3.0 %   Neutro Abs 5.0 1.4 - 7.7 K/uL   Lymphs Abs  1.0 0.7 - 4.0 K/uL   Monocytes Absolute 0.6 0.1 - 1.0 K/uL   Eosinophils Absolute 0.2 0.0 - 0.7 K/uL   Basophils Absolute 0.0 0.0 - 0.1 K/uL  Basic metabolic panel  Result Value Ref Range   Sodium 138 135 - 145 mEq/L   Potassium 4.4 3.5 - 5.1 mEq/L   Chloride 104 96 - 112 mEq/L   CO2 27 19 - 32 mEq/L   Glucose, Bld 164 (H) 70 - 99 mg/dL   BUN 17 6 - 23 mg/dL   Creatinine, Ser 1.60 (H) 0.40 - 1.50 mg/dL   Calcium 9.2 8.4 - 10.5 mg/dL   GFR 43.62 (L) >60.00 mL/min  Ferritin  Result Value Ref Range   Ferritin 89.9 22.0 - 322.0 ng/mL  Vitamin B12  Result Value Ref Range   Vitamin B-12 194 (L) 211 - 911 pg/mL  IBC panel  Result Value Ref Range   Iron 38 (L) 42 - 165 ug/dL   Transferrin 281.0 212.0 - 360.0 mg/dL   Saturation Ratios 9.7 (L) 20.0 - 50.0 %  Folate  Result Value Ref Range   Folate >23.7 >5.9 ng/mL  Hemoglobin A1c  Result Value Ref Range   Hgb A1c MFr Bld 6.9 (H) 4.6 - 6.5 %      Assessment & Plan:  Advised to have MyChart account inactivated. Pt requests postponed f/u to 6 mo Problem List Items Addressed This Visit    Anemia, iron deficiency    Pt states he does not tolerate supplemental iron. Discussed iron rich foods, will mail handout.       B12 deficiency    New - pt declines B12 shots. Will start 1000 mcg daily.      CKD (chronic kidney disease) stage 3, GFR 30-59 ml/min    Stable. Check labs next visit.       Dilated cardiomyopathy ([place])   Lesion of right external ear    Recurrent, rec f/u with dentist to r/o basal cell cancer.       Type 2 diabetes, controlled, with neuropathy ([place]) - Primary    Pt to restart glipizide, continue lantus. No recent hypoglycemia.        Other Visit Diagnoses    Cerebral infarction, unspecified mechanism ([place])           Follow up plan: Return in about 6 months (around 02/10/2017) for annual exam, prior fasting for blood work.  [name], MD

## 2016-08-12 NOTE — Progress Notes (Signed)
Pre visit review using our clinic review tool, if applicable. No additional management support is needed unless otherwise documented below in the visit note. 

## 2016-08-21 ENCOUNTER — Other Ambulatory Visit: Payer: Self-pay | Admitting: Family Medicine

## 2016-08-21 ENCOUNTER — Telehealth: Payer: Self-pay | Admitting: Family Medicine

## 2016-08-21 NOTE — Telephone Encounter (Signed)
Spoke to pt son. They will stop by office tomorrow and schedule AWV + labs with Katha Cabal and CPE with PCP in 6 months

## 2016-08-21 NOTE — Telephone Encounter (Signed)
Scheduled 02/13/16 for AWV + labs  Schedule 02/20/16 for CPE

## 2016-08-25 ENCOUNTER — Telehealth: Payer: Self-pay

## 2016-08-25 NOTE — Telephone Encounter (Signed)
Pt walked in with bag of meds; reviewed pts med list and pt was supposed to be taking meds in back per med list except for lasix. Pt and young man with pt voiced understanding for pt not to take the Lasix. Also pt voiced understanding to take the other meds per instructions on bottles. Pt voiced understanding.

## 2016-09-02 ENCOUNTER — Ambulatory Visit: Payer: Medicare Other | Admitting: Family Medicine

## 2016-09-14 ENCOUNTER — Encounter: Payer: Self-pay | Admitting: Family Medicine

## 2016-09-19 ENCOUNTER — Telehealth: Payer: Self-pay

## 2016-09-19 DIAGNOSIS — H6191 Disorder of right external ear, unspecified: Secondary | ICD-10-CM

## 2016-09-19 NOTE — Telephone Encounter (Signed)
Referral placed.

## 2016-09-19 NOTE — Telephone Encounter (Signed)
Pt walked in ; spot on back side of rt ear bled last night; no bleeding now. Dr Darnell Level said would do referral to skin doctor if OK with pt; pt agreed to see skin doctor in [place]. Pt will wait to hear from [facility] about appt. In meantime pt will put bandaid on ear before going to bed so not to rub ear on pillow and cause to bleed. FYI to Dr Darnell Level.

## 2016-09-24 ENCOUNTER — Ambulatory Visit: Payer: Medicare Other | Admitting: Neurology

## 2016-09-25 NOTE — Telephone Encounter (Signed)
Appt made at [facility]  And patients daughter notified.

## 2016-11-12 ENCOUNTER — Other Ambulatory Visit: Payer: Self-pay

## 2016-11-12 MED ORDER — INSULIN GLARGINE 100 UNIT/ML ~~LOC~~ SOLN: 28.0000 [IU] | Freq: Every day | SUBCUTANEOUS | 0 refills | Status: DC

## 2016-12-07 ENCOUNTER — Emergency Department (HOSPITAL_COMMUNITY)
Admission: EM | Admit: 2016-12-07 | Discharge: 2016-12-07 | Disposition: A | Discharge status: Home / Self Care | Payer: Medicare Other | Attending: Emergency Medicine | Admitting: Emergency Medicine

## 2016-12-07 ENCOUNTER — Encounter (HOSPITAL_COMMUNITY): Payer: Self-pay | Admitting: Emergency Medicine

## 2016-12-07 ENCOUNTER — Emergency Department (HOSPITAL_COMMUNITY): Payer: Medicare Other

## 2016-12-07 DIAGNOSIS — Z8673 Personal history of transient ischemic attack (TIA), and cerebral infarction without residual deficits: Secondary | ICD-10-CM | POA: Diagnosis not present

## 2016-12-07 DIAGNOSIS — Y999 Unspecified external cause status: Secondary | ICD-10-CM | POA: Diagnosis not present

## 2016-12-07 DIAGNOSIS — D649 Anemia, unspecified: Secondary | ICD-10-CM | POA: Diagnosis not present

## 2016-12-07 DIAGNOSIS — W1830XA Fall on same level, unspecified, initial encounter: Secondary | ICD-10-CM | POA: Insufficient documentation

## 2016-12-07 DIAGNOSIS — S0990XA Unspecified injury of head, initial encounter: Secondary | ICD-10-CM | POA: Diagnosis not present

## 2016-12-07 DIAGNOSIS — E1140 Type 2 diabetes mellitus with diabetic neuropathy, unspecified: Secondary | ICD-10-CM | POA: Diagnosis not present

## 2016-12-07 DIAGNOSIS — Z79899 Other long term (current) drug therapy: Secondary | ICD-10-CM | POA: Insufficient documentation

## 2016-12-07 DIAGNOSIS — Y939 Activity, unspecified: Secondary | ICD-10-CM | POA: Diagnosis not present

## 2016-12-07 DIAGNOSIS — S2242XA Multiple fractures of ribs, left side, initial encounter for closed fracture: Secondary | ICD-10-CM | POA: Diagnosis not present

## 2016-12-07 DIAGNOSIS — Z87891 Personal history of nicotine dependence: Secondary | ICD-10-CM | POA: Diagnosis not present

## 2016-12-07 DIAGNOSIS — J449 Chronic obstructive pulmonary disease, unspecified: Secondary | ICD-10-CM | POA: Insufficient documentation

## 2016-12-07 DIAGNOSIS — S20212A Contusion of left front wall of thorax, initial encounter: Secondary | ICD-10-CM | POA: Insufficient documentation

## 2016-12-07 DIAGNOSIS — W19XXXA Unspecified fall, initial encounter: Secondary | ICD-10-CM

## 2016-12-07 DIAGNOSIS — R930 Abnormal findings on diagnostic imaging of skull and head, not elsewhere classified: Secondary | ICD-10-CM | POA: Diagnosis not present

## 2016-12-07 DIAGNOSIS — N289 Disorder of kidney and ureter, unspecified: Secondary | ICD-10-CM

## 2016-12-07 DIAGNOSIS — Z7901 Long term (current) use of anticoagulants: Secondary | ICD-10-CM | POA: Diagnosis not present

## 2016-12-07 DIAGNOSIS — Y92009 Unspecified place in unspecified non-institutional (private) residence as the place of occurrence of the external cause: Secondary | ICD-10-CM | POA: Insufficient documentation

## 2016-12-07 DIAGNOSIS — Z794 Long term (current) use of insulin: Secondary | ICD-10-CM | POA: Insufficient documentation

## 2016-12-07 DIAGNOSIS — E1122 Type 2 diabetes mellitus with diabetic chronic kidney disease: Secondary | ICD-10-CM | POA: Diagnosis not present

## 2016-12-07 DIAGNOSIS — S299XXA Unspecified injury of thorax, initial encounter: Secondary | ICD-10-CM | POA: Diagnosis present

## 2016-12-07 DIAGNOSIS — N183 Chronic kidney disease, stage 3 (moderate): Secondary | ICD-10-CM | POA: Diagnosis not present

## 2016-12-07 DIAGNOSIS — S199XXA Unspecified injury of neck, initial encounter: Secondary | ICD-10-CM | POA: Diagnosis not present

## 2016-12-07 DIAGNOSIS — I129 Hypertensive chronic kidney disease with stage 1 through stage 4 chronic kidney disease, or unspecified chronic kidney disease: Secondary | ICD-10-CM | POA: Diagnosis not present

## 2016-12-07 DIAGNOSIS — M79642 Pain in left hand: Secondary | ICD-10-CM | POA: Diagnosis not present

## 2016-12-07 DIAGNOSIS — R42 Dizziness and giddiness: Secondary | ICD-10-CM | POA: Diagnosis not present

## 2016-12-07 DIAGNOSIS — S60222A Contusion of left hand, initial encounter: Secondary | ICD-10-CM | POA: Insufficient documentation

## 2016-12-07 DIAGNOSIS — R404 Transient alteration of awareness: Secondary | ICD-10-CM | POA: Diagnosis not present

## 2016-12-07 DIAGNOSIS — S6992XA Unspecified injury of left wrist, hand and finger(s), initial encounter: Secondary | ICD-10-CM | POA: Diagnosis not present

## 2016-12-07 LAB — CBC WITH DIFFERENTIAL/PLATELET
Basophils Absolute: 0 10*3/uL (ref 0.0–0.1)
Basophils Relative: 0 %
EOS ABS: 0.3 10*3/uL (ref 0.0–0.7)
EOS PCT: 4 %
HCT: 34.4 % — ABNORMAL LOW (ref 39.0–52.0)
Hemoglobin: 11.3 g/dL — ABNORMAL LOW (ref 13.0–17.0)
LYMPHS ABS: 0.9 10*3/uL (ref 0.7–4.0)
Lymphocytes Relative: 14 %
MCH: 30.5 pg (ref 26.0–34.0)
MCHC: 32.8 g/dL (ref 30.0–36.0)
MCV: 92.7 fL (ref 78.0–100.0)
MONOS PCT: 6 %
Monocytes Absolute: 0.4 10*3/uL (ref 0.1–1.0)
Neutro Abs: 4.8 10*3/uL (ref 1.7–7.7)
Neutrophils Relative %: 76 %
PLATELETS: 173 10*3/uL (ref 150–400)
RBC: 3.71 MIL/uL — ABNORMAL LOW (ref 4.22–5.81)
RDW: 13.6 % (ref 11.5–15.5)
WBC: 6.4 10*3/uL (ref 4.0–10.5)

## 2016-12-07 LAB — BASIC METABOLIC PANEL
Anion gap: 11 (ref 5–15)
BUN: 17 mg/dL (ref 6–20)
CHLORIDE: 105 mmol/L (ref 101–111)
CO2: 22 mmol/L (ref 22–32)
CREATININE: 1.72 mg/dL — AB (ref 0.61–1.24)
Calcium: 8.7 mg/dL — ABNORMAL LOW (ref 8.9–10.3)
GFR calc Af Amer: 39 mL/min — ABNORMAL LOW (ref >60)
GFR calc non Af Amer: 34 mL/min — ABNORMAL LOW (ref >60)
Glucose, Bld: 187 mg/dL — ABNORMAL HIGH (ref 65–99)
Potassium: 4.4 mmol/L (ref 3.5–5.1)
SODIUM: 138 mmol/L (ref 135–145)

## 2016-12-07 NOTE — ED Notes (Signed)
Pt reports that he fell 3 days ago and thought he should get checked out. Pt reports pain to the left lower ribcage, and pain to the left hand. Pt denies LOC.

## 2016-12-07 NOTE — ED Triage Notes (Signed)
[facility] reports the pt called 9-1-1 for falling 3 days ago. EMS reports the pt has bruising to the left lower ribcage, thumbs, and both forearms. EMS reports no orthostatic changes. EMS also reports that the patient told them that he has been having dizziness for 1 year. EMS 12 lead reveals A-fib with no other ectopy noted. Vital signs are 0132/87, p-94, R-18, CBG 199.   Pt reports that he fell 3 days ago and thought he should get checked out. Pt reports pain to the left lower ribcage, and pain to the left hand. Pt denies LOC.

## 2016-12-07 NOTE — ED Provider Notes (Signed)
[facility] DEPT Provider Note   CSN: [id number] Arrival date & time: 12/07/16  0410     History   Chief Complaint Chief Complaint  Patient presents with  . Fall    3 days ago    HPI Eric [name] is a 83 y.o. male.  He fell 3 days ago. He was getting up from a chair and felt dizzy and fell down. He denies hitting his head, but is complaining of pain in the left rib cage and in the left hand. He states he would wanted to watch himself at home to see if there is any problem, but pain was getting worse. He does have intermittent problems with dizziness when he stands up. He is anticoagulated on apixaban for atrial fibrillation.   The history is provided by the patient.  Fall     Past Medical History:  Diagnosis Date  . Acute endocarditis   . Bilateral hydrocele 3/11   [facility]  . Cerebrovascular disease, unspecified   . Cervicalgia   . Colon polyp   . COPD (chronic obstructive pulmonary disease) ([place]) 2017   dx during hospitalization  . Coronary atherosclerosis of unspecified type of vessel, native or graft    s/p CABG 2010  . Esophageal reflux   . Essential hypertension, benign   . Foraminal stenosis of cervical region    bilateral-C4-C5, C5-C6  . History of aortic stenosis    s/p valve replacment 2010  . Pure hypercholesterolemia   . Shingles 07/27/2012  . Stroke ([place])   . Type II or unspecified type diabetes mellitus with neurological manifestations, not stated as uncontrolled(250.60)   . 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 Lesion of right external ear 08/12/2016  . B12 deficiency 06/02/2016  . Carotid stenosis 02/28/2016  . Anemia, iron deficiency 02/28/2016  . Dilated cardiomyopathy ([place])   . Pulmonary hypertension   . Essential hypertension   . Acute on chronic renal failure ([place])   . S/P CABG (coronary artery bypass graft)  12/25/2015  . S/P AVR (aortic valve replacement) 12/25/2015  . COPD (chronic obstructive pulmonary disease) ([place]) 10/21/2015  . Advanced care planning/counseling discussion 03/29/2015  . GERD (gastroesophageal reflux disease) 07/08/2014  . Paroxysmal atrial fibrillation ([place]) 07/02/2014  . Status post total replacement of left hip 06/30/2014  . Medicare annual wellness visit, subsequent 03/08/2012  . CKD (chronic kidney disease) stage 3, GFR 30-59 ml/min 09/24/2010  . BPH (benign prostatic hypertrophy) with urinary obstruction 01/10/2010  . CVA (cerebral vascular accident) ([place]) 02/28/2009  . Type 2 diabetes, controlled, with neuropathy ([place]) 12/17/2006  . Diabetic neuropathy associated with type 2 diabetes mellitus ([place]) 12/17/2006  . HYPERCHOLESTEROLEMIA 12/17/2006  . Coronary atherosclerosis 12/17/2006    Past Surgical History:  Procedure Laterality Date  . AORTIC VALVE REPLACEMENT  12/2008   with pericardial tissue valve  . Carotid US  10/2009   B ICA stenosis, stable disease, rec rpt 2 years  . CATARACT EXTRACTION  1990's   bilateral  . CHOLECYSTECTOMY  1995  . COLONOSCOPY  [place]  . CORONARY ARTERY BYPASS GRAFT  3/10   x3-using a left internal mammary artery to the left anterior descending coronary artery, saphenous vein graft to circumflex marginal branch, spahenous vein graft  to posterior descendingcoronary artery. Endoscopic saphenous vein harvest from bilateral thighs was done.   Marland Kitchen HYDROCELE EXCISION  bilateral ([facility])  . L leg trauma  1957   truck over left leg  . PTCA  12/99   with stent  . sphincterectomy  09/20/03   for jaundice  . TEE WITHOUT CARDIOVERSION N/A 01/09/2016   Procedure: TRANSESOPHAGEAL ECHOCARDIOGRAM (TEE);  Surgeon: Dorothy Spark, MD;  Location: [facility];  Service: Cardiovascular;  Laterality: N/A;  . TEE WITHOUT CARDIOVERSION N/A 01/15/2016   Procedure: TRANSESOPHAGEAL ECHOCARDIOGRAM (TEE);  Surgeon:  Larey Dresser, MD;  Location: [facility];  Service: Cardiovascular;  Laterality: N/A;  . TOTAL HIP ARTHROPLASTY Left 06/30/2014   Procedure: LEFT TOTAL HIP ARTHROPLASTY ANTERIOR APPROACH;  Surgeon: Mcarthur Rossetti, MD;  [facility];  Service: Orthopedics;  Laterality: Left;       Home Medications    Prior to Admission medications   Medication Sig Start Date End Date Taking? Authorizing Provider  apixaban (ELIQUIS) 2.5 MG TABS tablet Take 1 tablet (2.5 mg total) by mouth 2 (two) times daily. 04/30/16   Lelon Perla, MD  atorvastatin (LIPITOR) 20 MG tablet TAKE 1 TABLET DAILY AT 6 P.M. 08/21/16   [name], MD  diltiazem (CARDIZEM) 60 MG tablet Take 1/2 tablet (30mg ) in am and 1 tablet (60mg ) in pm. Hold if HR < 60/min SBP < 110 06/27/16   Elby Beck, FNP  famotidine (PEPCID) 20 MG tablet Take 1 tablet (20 mg total) by mouth daily. 05/29/16   [name], MD  ferrous sulfate 325 (65 FE) MG tablet Take 1 tablet (325 mg total) by mouth daily with breakfast. 06/02/16   [name], MD  glipiZIDE (GLUCOTROL) 10 MG tablet Take 1 tablet (10 mg total) by mouth daily before breakfast. Patient not taking: Reported on 08/12/2016 05/29/16   [name], MD  insulin glargine (LANTUS) 100 UNIT/ML injection Inject 0.28 mLs (28 Units total) into the skin at bedtime. 11/12/16   [name], MD  Insulin Syringe-Needle U-100 (B-D INS SYR ULTRAFINE 1CC/30G) 30G X 1/2" 1 ML MISC Use when taking insulin.Dx E11.40 08/01/16   Tonia Ghent, MD  nitroGLYCERIN (NITROSTAT) 0.4 MG SL tablet Place 0.4 mg under the tongue every 5 (five) minutes as needed. Call MD/NP if pain unrelieved.    Historical Provider, MD  vitamin B-12 (CYANOCOBALAMIN) 1000 MCG tablet Take 1 tablet (1,000 mcg total) by mouth daily. 06/02/16   [name], MD    Family History Family History  Problem Relation Age of Onset  . Heart attack Father 90  . Breast cancer Mother 24  . Brain cancer  Sister   . Prostate cancer Brother   . Diabetes Neg Hx   . Stroke Neg Hx     Social History Social History  Substance Use Topics  . Smoking status: Former Smoker    Packs/day: 1.00    Years: 50.00    Types: Cigarettes    Quit date: 10/20/1993  . Smokeless tobacco: Never Used  . Alcohol use 0.6 oz/week    1 Cans of beer per week     Comment: occasional     Allergies   Ampicillin   Review of Systems Review of Systems  All other systems reviewed and are negative.    Physical Exam Updated Vital Signs BP 126/75 (BP Location: Left Arm)   Pulse 86   Temp 97.8 F (36.6 C) (Oral)   Resp 18   Ht 5\' 9"  (1.753 m)   Wt 198 lb (89.8 kg)   SpO2 99%   BMI 29.24 kg/m  Physical Exam  Nursing note and vitals reviewed.  83 year old male, resting comfortably and in no acute distress. Vital signs are Normal. Oxygen saturation is 99%, which is normal. Head is normocephalic and atraumatic. PERRLA, EOMI. Oropharynx is clear. Neck is nontender without adenopathy or JVD. Back is nontender and there is no CVA tenderness. Lungs are clear without rales, wheezes, or rhonchi. Chest is moderately tender in the left side laterally without crepitus. Heart has an irregular rhythm without murmur. Abdomen is soft, flat, nontender without masses or hepatosplenomegaly and peristalsis is normoactive. Extremities: Slight ecchymosis noted in the thenar area of the left hand without swelling or tenderness. Skin is warm and dry without rash. Neurologic: Mental status is normal, cranial nerves are intact, there are no motor or sensory deficits.  ED Treatments / Results  Labs (all labs ordered are listed, but only abnormal results are displayed) Labs Reviewed  BASIC METABOLIC PANEL  CBC WITH DIFFERENTIAL/PLATELET    EKG  EKG Interpretation  Date/Time:  Sunday December 07 2016 04:15:39 EST Ventricular Rate:  94 PR Interval:    QRS Duration: 109 QT Interval:  374 QTC Calculation: 468 R  Axis:   -72 Text Interpretation:  Atrial fibrillation Ventricular premature complex Incomplete RBBB and LAFB Abnormal R-wave progression, late transition No significant change since last tracing Confirmed by Christy Gentles  MD, DONALD ([id number]) on 12/07/2016 4:41:33 AM       Radiology No results found.  Procedures Procedures (including critical care time)  Medications Ordered in ED Medications - No data to display   Initial Impression / Assessment and Plan / ED Course  I have reviewed the triage vital signs and the nursing notes.  Pertinent labs & imaging results that were available during my care of the patient were reviewed by me and considered in my medical decision making (see chart for details).  Fall at home in the patient was anticoagulated. We'll need to check rib x-rays. Because of anticoagulated state, will check CT of head. Old records are reviewed confirming that the patient is being treated for atrial fibrillation.  CT scans and x-rays are negative for acute process. Laboratory workup does show chronic renal insufficiency and chronic anemia which are unchanged from baseline. Patient is admonished that he should come to the ED promptly should he have a fall because of risk of intracranial bleeding while he is anticoagulated.  CHA2DS2/VAS Stroke Risk Points      8 >= 2 Points: High Risk  1 - 1.99 Points: Medium Risk  0 Points: Low Risk    The patient's score has not changed in the past year.:  No Change         Details    Note: External data might be a factor in metrics not marked with    Points Metrics   This score determines the patient's risk of having a stroke if the  patient has atrial fibrillation.       1 Has Congestive Heart Failure:  Yes   1 Has Vascular Disease:  Yes   1 Has Hypertension:  Yes   2 Age:  78   1 Has Diabetes:  Yes   2 Had Stroke:  Yes Had TIA:  No Had thromboembolism:  No   0 Male:  No          Final Clinical Impressions(s) / ED Diagnoses    Final diagnoses:  Fall at home, initial encounter  Anticoagulant long-term use  Contusion, chest wall, left, initial encounter  Contusion of left hand, initial encounter  Renal insufficiency  Normochromic normocytic anemia    New Prescriptions New Prescriptions   No medications on file     Delora Fuel, MD [id number]

## 2016-12-07 NOTE — Discharge Instructions (Signed)
If you fall, you need to be seen right away - the blood thinner you are taking puts you at increased risk for bleeding in the brain.  Take acetaminophen as needed for pain. Do not take ibuprofen or naproxen because they increase your risk of bleeding.

## 2017-01-08 LAB — BASIC METABOLIC PANEL
BUN: 10 (ref 4–21)
Creatinine: 1.1 (ref 0.6–1.3)
GLUCOSE: 141
POTASSIUM: 4 (ref 3.4–5.3)
SODIUM: 135 — AB (ref 137–147)

## 2017-01-12 ENCOUNTER — Encounter: Payer: Self-pay | Admitting: Physician Assistant

## 2017-01-12 ENCOUNTER — Ambulatory Visit (INDEPENDENT_AMBULATORY_CARE_PROVIDER_SITE_OTHER): Payer: Medicare Other | Admitting: Physician Assistant

## 2017-01-12 VITALS — BP 112/65 | HR 84 | Ht 69.0 in | Wt 207.2 lb

## 2017-01-12 DIAGNOSIS — E119 Type 2 diabetes mellitus without complications: Secondary | ICD-10-CM

## 2017-01-12 DIAGNOSIS — I10 Essential (primary) hypertension: Secondary | ICD-10-CM

## 2017-01-12 DIAGNOSIS — I481 Persistent atrial fibrillation: Secondary | ICD-10-CM

## 2017-01-12 DIAGNOSIS — I25810 Atherosclerosis of coronary artery bypass graft(s) without angina pectoris: Secondary | ICD-10-CM

## 2017-01-12 DIAGNOSIS — E785 Hyperlipidemia, unspecified: Secondary | ICD-10-CM | POA: Diagnosis not present

## 2017-01-12 DIAGNOSIS — Z952 Presence of prosthetic heart valve: Secondary | ICD-10-CM | POA: Diagnosis not present

## 2017-01-12 DIAGNOSIS — IMO0001 Reserved for inherently not codable concepts without codable children: Secondary | ICD-10-CM

## 2017-01-12 DIAGNOSIS — I4819 Other persistent atrial fibrillation: Secondary | ICD-10-CM

## 2017-01-12 DIAGNOSIS — Z794 Long term (current) use of insulin: Secondary | ICD-10-CM

## 2017-01-12 NOTE — Patient Instructions (Signed)
Medication Instructions:  Your physician recommends that you continue on your current medications as directed. Please refer to the Current Medication list given to you today.  If you need a refill on your cardiac medications before your next appointment, please call your pharmacy.  Follow-Up: Your physician wants you to follow-up in: [place].    Thank you for choosing [facility]!!    HAO MENG, PA-C [place], LPN

## 2017-01-12 NOTE — Progress Notes (Signed)
Cardiology Office Note    Date:  01/12/2017   ID:  Eric [name], DOB 11/02/1926, MRN [id number]  PCP:  [name], MD  Cardiologist:  Dr. Stanford Breed  Chief Complaint  Patient presents with  . Follow-up    seen for Dr. Danie Chandler, pt denied chest pain and SOB    History of Present Illness:  Eric [name] is a 83 y.o. male with PMH of COPD, CAD s/p CABG 2010, HTN, HLD, DM II, CKD stage III, h/o CVA, persistent atrial fibrillation, severe AS s/p AVR and h/o mitral valve endocarditis. He underwent aortic valve replacement with #25-mm [facility] Ease pericardial tissue valve as well as CABG with LIMA to LAD, SVG to circ marginal, SVG to PDA on 12/22/2008. He was seen in the hospital in March 2017 with new onset of atrial fibrillation, pneumonia and endocarditis. He finished IV antibiotic for his endocarditis. Carotid Doppler obtained in March 2017 showed mild 1-39% bilateral stenosis. Transesophageal echocardiogram done during the same admission showed normal LV systolic function, bioprosthetic aortic valve, moderate to severe mitral annular calcification with mild MS, moderate MR, and 2 cm vegetation attached to the posterior mitral leaflet. Abdominal CT in April 2017 showed abdominal aortic aneurysm measuring 3.2 cm. He also had a repeat echocardiogram in May 2017 that showed normal LV function, mild mitral stenosis and MR, mild tricuspid regurg. He was last seen on 06/26/2016, he was doing well at the time, a 24 hour Holter monitor was obtained on 07/03/2016 that showed rate controlled atrial fibrillation. He was recently seen in the emergency room on 12/07/2016 after a fall, he did have rib fracture on the left 9th and 10th ribs.   Patient presents today for six-month follow-up, others than some mild soreness under his left rib, he has been doing very well. He denies any significant shortness of breath or chest discomfort. His heart rate is well-controlled on low-dose diltiazem. His blood  pressure is borderline, therefore unable to titrate any further. He says if he walked too fast with his cane, he does have some balance issues, he understands he would need to be more careful on systemic anticoagulation therapy. Otherwise recent CT of head and neck was negative for acute fractures.   Past Medical History:  Diagnosis Date  . Acute endocarditis   . Bilateral hydrocele 3/11   [facility]  . Cerebrovascular disease, unspecified   . Cervicalgia   . Colon polyp   . COPD (chronic obstructive pulmonary disease) ([place]) 2017   dx during hospitalization  . Coronary atherosclerosis of unspecified type of vessel, native or graft    s/p CABG 2010  . Esophageal reflux   . Essential hypertension, benign   . Foraminal stenosis of cervical region    bilateral-C4-C5, C5-C6  . History of aortic stenosis    s/p valve replacment 2010  . Pure hypercholesterolemia   . Shingles 07/27/2012  . Stroke ([place])   . Type II or unspecified type diabetes mellitus with neurological manifestations, not stated as uncontrolled(250.60)   . 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 AORTIC VALVE REPLACEMENT  12/2008   with pericardial tissue valve  . Carotid US  10/2009   B ICA stenosis, stable disease, rec rpt 2 years  . CATARACT EXTRACTION  1990's   bilateral  . CHOLECYSTECTOMY  1995  . COLONOSCOPY  [place]  .  CORONARY ARTERY BYPASS GRAFT  3/10   x3-using a left internal mammary artery to the left anterior descending coronary artery, saphenous vein graft to circumflex marginal branch, spahenous vein graft  to posterior descendingcoronary artery. Endoscopic saphenous vein harvest from bilateral thighs was done.   Marland Kitchen HYDROCELE EXCISION     bilateral ([facility])  . L leg trauma  1957   truck over left leg  . PTCA  12/99   with stent  . sphincterectomy   09/20/03   for jaundice  . TEE WITHOUT CARDIOVERSION N/A 01/09/2016   Procedure: TRANSESOPHAGEAL ECHOCARDIOGRAM (TEE);  Surgeon: Dorothy Spark, MD;  Location: [facility];  Service: Cardiovascular;  Laterality: N/A;  . TEE WITHOUT CARDIOVERSION N/A 01/15/2016   Procedure: TRANSESOPHAGEAL ECHOCARDIOGRAM (TEE);  Surgeon: Larey Dresser, MD;  Location: [facility];  Service: Cardiovascular;  Laterality: N/A;  . TOTAL HIP ARTHROPLASTY Left 06/30/2014   Procedure: LEFT TOTAL HIP ARTHROPLASTY ANTERIOR APPROACH;  Surgeon: Mcarthur Rossetti, MD;  [facility];  Service: Orthopedics;  Laterality: Left;    Current Medications: Outpatient Medications Prior to Visit  Medication Sig Dispense Refill  . apixaban (ELIQUIS) 2.5 MG TABS tablet Take 1 tablet (2.5 mg total) by mouth 2 (two) times daily. 180 tablet 3  . atorvastatin (LIPITOR) 20 MG tablet TAKE 1 TABLET DAILY AT 6 P.M. 90 tablet 1  . diltiazem (CARDIZEM) 60 MG tablet Take 1/2 tablet (30mg ) in am and 1 tablet (60mg ) in pm. Hold if HR < 60/min SBP < 110 135 tablet 1  . famotidine (PEPCID) 20 MG tablet Take 1 tablet (20 mg total) by mouth daily. 90 tablet 3  . ferrous sulfate 325 (65 FE) MG tablet Take 1 tablet (325 mg total) by mouth daily with breakfast.    . glipiZIDE (GLUCOTROL) 10 MG tablet Take 1 tablet (10 mg total) by mouth daily before breakfast. 90 tablet 3  . insulin glargine (LANTUS) 100 UNIT/ML injection Inject 0.28 mLs (28 Units total) into the skin at bedtime. 30 mL 0  . Insulin Syringe-Needle U-100 (B-D INS SYR ULTRAFINE 1CC/30G) 30G X 1/2" 1 ML MISC Use when taking insulin.Dx E11.40 100 each 5  . nitroGLYCERIN (NITROSTAT) 0.4 MG SL tablet Place 0.4 mg under the tongue every 5 (five) minutes as needed. Call MD/NP if pain unrelieved.    . vitamin B-12 (CYANOCOBALAMIN) 1000 MCG tablet Take 1 tablet (1,000 mcg total) by mouth daily.     No facility-administered medications prior to visit.      Allergies:   Ampicillin    Social History   Social History  . Marital status: Widowed    Spouse name: N/A  . Number of children: 3  . Years of education: N/A   Occupational History  . retired-truck driver    Social History Main Topics  . Smoking status: Former Smoker    Packs/day: 1.00    Years: 50.00    Types: Cigarettes    Quit date: 10/20/1993  . Smokeless tobacco: Never Used  . Alcohol use 0.6 oz/week    1 Cans of beer per week     Comment: occasional  . Drug use: No  . Sexual activity: No   Other Topics Concern  . None   Social History Narrative   Caffeine: 2-3 cups coffee   Lives with son   Retired Educational psychologist, lives with 2 sons, 1 daughter in [place]   Activity: no regular exercise 2/2 hip pain and SOB.  Family History:  The patient's family history includes Brain cancer in his sister; Breast cancer (age of onset: 79) in his mother; Heart attack (age of onset: 47) in his father; Prostate cancer in his brother.   ROS:   Please see the history of present illness.    ROS All other systems reviewed and are negative.   PHYSICAL EXAM:   VS:  BP 112/65   Pulse 84   Ht 5\' 9"  (1.753 m)   Wt 207 lb 3.2 oz (94 kg)   BMI 30.60 kg/m    GEN: Well nourished, well developed, in no acute distress  HEENT: normal  Neck: no JVD, carotid bruits, or masses Cardiac: Irregularly irregular; no murmurs, rubs, or gallops,no edema  Respiratory:  clear to auscultation bilaterally, normal work of breathing GI: soft, nontender, nondistended, + BS MS: no deformity or atrophy  Skin: warm and dry, no rash Neuro:  Alert and Oriented x 3, Strength and sensation are intact Psych: euthymic mood, full affect  Wt Readings from Last 3 Encounters:  01/12/17 207 lb 3.2 oz (94 kg)  12/07/16 198 lb (89.8 kg)  08/12/16 197 lb 8 oz (89.6 kg)      Studies/Labs Reviewed:   EKG:  EKG is ordered today.  The ekg ordered today demonstrates Atrial fibrillation with heart rate 84  Recent Labs: 01/30/2016: B  Natriuretic Peptide 193.0 02/18/2016: ALT 7 12/07/2016: BUN 17; Creatinine, Ser 1.72; Hemoglobin 11.3; Platelets 173; Potassium 4.4; Sodium 138   Lipid Panel    Component Value Date/Time   CHOL 107 01/07/2016 0214   TRIG 112 01/07/2016 0214   HDL 33 (L) 01/07/2016 0214   CHOLHDL 3.2 01/07/2016 0214   VLDL 22 01/07/2016 0214   LDLCALC 52 01/07/2016 0214   LDLDIRECT 51.0 03/05/2015 1230    Additional studies/ records that were reviewed today include:   Echo 02/19/2016 LV EF: 50% -   55%  - Left ventricle: The cavity size was normal. There was moderate   concentric hypertrophy. Systolic function was normal. The   estimated ejection fraction was in the range of 50% to 55%. Wall   motion was normal; there were no regional wall motion   abnormalities. The study is not technically sufficient to allow   evaluation of LV diastolic function. - Aortic valve: Mildly calcified leaflets. Transvalvular velocity   was minimally increased. - Mitral valve: Heavily calcified annulus, especially posteriorly   with a calcified mass. Mild stenosis. There was mild   regurgitation. Pressure half-time: 91 ms. Mean gradient (D): 6 mm   Hg. Valve area by pressure half-time: 2.42 cm^2. - Right atrium: The atrium was mildly dilated. - Tricuspid valve: There was mild regurgitation. - Pulmonary arteries: PA peak pressure: 49 mm Hg (S). - Inferior vena cava: The vessel was normal in size. The   respirophasic diameter changes were in the normal range (>= 50%),   consistent with normal central venous pressure.  Impressions:  - Compared to a prior study in 12/2015, the EF is slightly lower at   50-55%. The mitral valve is unchanged with a calcified posterior   mass and mild mitral stenosis.    24 hour holter monitor 07/03/2016  Atrial fibrillation with pvcs or aberrantly conducted beats; rate controlled      ASSESSMENT:    1. Persistent atrial fibrillation ([place])   2. Coronary artery disease  involving coronary bypass graft of native heart without angina pectoris   3. Essential hypertension   4. Hyperlipidemia, unspecified hyperlipidemia  type   5. IDDM (insulin dependent diabetes mellitus) ([place])   6. S/P AVR      PLAN:  In order of problems listed above:  1. Persistent atrial fibrillation on eliquis BID   - On low-dose eliquis 2.5 mg twice a day due to renal issue and age  - This patients CHA2DS2-VASc Score and unadjusted Ischemic Stroke Rate (% per year) is equal to 11.2 % stroke rate/year from a score of 7  Above score calculated as 1 point each if present [CHF, HTN, DM, Vascular=MI/PAD/Aortic Plaque, Age if 65-74, or Male] Above score calculated as 2 points each if present [Age > 75, or Stroke/TIA/TE]  - He is doing very well on rate control, we will continue the current dose of diltiazem.  2. Coronary artery disease s/p CABG LIMA to LAD, SVG to circ marginal, SVG to PDA on 12/22/2008: Denies any recent angina  3. Hypertension: Blood pressure well controlled at 112/65  4. Hyperlipidemia: Continue on Lipitor 20 mg daily  5. IDDM: Continue insulin  6. h/o severe AR s/p AVR: No significant heart murmur noticed on physical exam.    Medication Adjustments/Labs and Tests Ordered: Current medicines are reviewed at length with the patient today.  Concerns regarding medicines are outlined above.  Medication changes, Labs and Tests ordered today are listed in the Patient Instructions below. Patient Instructions  Medication Instructions:  Your physician recommends that you continue on your current medications as directed. Please refer to the Current Medication list given to you today.  If you need a refill on your cardiac medications before your next appointment, please call your pharmacy.  Follow-Up: Your physician wants you to follow-up in: [place].    Thank you for choosing [facility]!!    Josilynn Losh, PA-C [place], LPN      Signed, Almyra Deforest, [place]  01/12/2017 5:27 PM    [facility] [address], [place], [place]  [address] Phone: [phone]; Fax: [phone]

## 2017-02-08 ENCOUNTER — Other Ambulatory Visit: Payer: Self-pay | Admitting: Family Medicine

## 2017-02-08 DIAGNOSIS — E538 Deficiency of other specified B group vitamins: Secondary | ICD-10-CM

## 2017-02-08 DIAGNOSIS — E1140 Type 2 diabetes mellitus with diabetic neuropathy, unspecified: Secondary | ICD-10-CM

## 2017-02-08 DIAGNOSIS — E7800 Pure hypercholesterolemia, unspecified: Secondary | ICD-10-CM

## 2017-02-08 DIAGNOSIS — N183 Chronic kidney disease, stage 3 unspecified: Secondary | ICD-10-CM

## 2017-02-09 ENCOUNTER — Telehealth: Payer: Self-pay

## 2017-02-09 DIAGNOSIS — I10 Essential (primary) hypertension: Secondary | ICD-10-CM

## 2017-02-09 MED ORDER — DILTIAZEM HCL 60 MG PO TABS: ORAL_TABLET | ORAL | 0 refills | Status: DC

## 2017-02-09 NOTE — Telephone Encounter (Signed)
Pt came to office for refills glucotrol and cardizem.pt states he cannot [facility] and will do without med if I cannot call and get refills. Sp[facility] with [facility]ist, [facility]; according to bottles pt brought pt should have available refills on both meds. Pt did not have refill  On diltiazem [facility]; could not call one refill in because I did not have D Carlean Purl FNP state license #. I spoke with Dr Darnell Level who said OK to call in as he would be prescriber. Medication phoned to Hilda Blades at [facility] as instructed. The Glucotrol was shipped to pt on 02/06/17.the diltiazem should be at pts home in 7-10 days. I asked pt if he had enough med to last until then or does he need small qty sent to local pharmacy. Pt said he had enough med to wait on mail order delivery. On phone with [facility] for 56 minutes. Pt has CPX with Dr Darnell Level on 02/19/17.

## 2017-02-12 ENCOUNTER — Ambulatory Visit (INDEPENDENT_AMBULATORY_CARE_PROVIDER_SITE_OTHER): Payer: Medicare Other

## 2017-02-12 VITALS — BP 120/80 | HR 91 | Temp 97.9°F | Ht 66.5 in | Wt 206.8 lb

## 2017-02-12 DIAGNOSIS — E1140 Type 2 diabetes mellitus with diabetic neuropathy, unspecified: Secondary | ICD-10-CM | POA: Diagnosis not present

## 2017-02-12 DIAGNOSIS — N183 Chronic kidney disease, stage 3 unspecified: Secondary | ICD-10-CM

## 2017-02-12 DIAGNOSIS — E7800 Pure hypercholesterolemia, unspecified: Secondary | ICD-10-CM

## 2017-02-12 DIAGNOSIS — Z0000 Encounter for general adult medical examination without abnormal findings: Secondary | ICD-10-CM

## 2017-02-12 DIAGNOSIS — E538 Deficiency of other specified B group vitamins: Secondary | ICD-10-CM

## 2017-02-12 LAB — LIPID PANEL
Cholesterol: 108 mg/dL (ref 0–200)
HDL: 38.3 mg/dL — AB (ref >39.00)
LDL Cholesterol: 32 mg/dL (ref 0–99)
NONHDL: 69.65
Total CHOL/HDL Ratio: 3
Triglycerides: 187 mg/dL — ABNORMAL HIGH (ref 0.0–149.0)
VLDL: 37.4 mg/dL (ref 0.0–40.0)

## 2017-02-12 LAB — COMPREHENSIVE METABOLIC PANEL
ALBUMIN: 4 g/dL (ref 3.5–5.2)
ALK PHOS: 62 U/L (ref 39–117)
ALT: 9 U/L (ref 0–53)
AST: 16 U/L (ref 0–37)
BILIRUBIN TOTAL: 0.9 mg/dL (ref 0.2–1.2)
BUN: 20 mg/dL (ref 6–23)
CO2: 27 mEq/L (ref 19–32)
Calcium: 8.9 mg/dL (ref 8.4–10.5)
Chloride: 103 mEq/L (ref 96–112)
Creatinine, Ser: 1.81 mg/dL — ABNORMAL HIGH (ref 0.40–1.50)
GFR: 37.78 mL/min — AB (ref >60.00)
GLUCOSE: 211 mg/dL — AB (ref 70–99)
Potassium: 4.2 mEq/L (ref 3.5–5.1)
Sodium: 137 mEq/L (ref 135–145)
TOTAL PROTEIN: 6.9 g/dL (ref 6.0–8.3)

## 2017-02-12 LAB — MICROALBUMIN / CREATININE URINE RATIO
CREATININE, U: 180.4 mg/dL
MICROALB UR: 11.8 mg/dL — AB (ref 0.0–1.9)
Microalb Creat Ratio: 6.5 mg/g (ref 0.0–30.0)

## 2017-02-12 LAB — HEMOGLOBIN A1C: HEMOGLOBIN A1C: 7.9 % — AB (ref 4.6–6.5)

## 2017-02-12 LAB — VITAMIN B12: Vitamin B-12: 180 pg/mL — ABNORMAL LOW (ref 211–911)

## 2017-02-12 NOTE — Patient Instructions (Addendum)
Eric [name] , Thank you for taking time to come for your Medicare Wellness Visit. I appreciate your ongoing commitment to your health goals. Please review the following plan we discussed and let me know if I can assist you in the future.   These are the goals we discussed: Goals    . safety          Starting 02/12/2017, I will continue to use cane or walker to reduce risk of falls.        This is a list of the screening recommended for you and due dates:  Health Maintenance  Topic Date Due  . Complete foot exam   02/19/2017*  . Eye exam for diabetics  02/16/2018*  . DTaP/Tdap/Td vaccine (1 - Tdap) 11/20/2020*  . Tetanus Vaccine  11/20/2020*  . Flu Shot  05/20/2017  . Hemoglobin A1C  08/14/2017  . Urine Protein Check  02/12/2018  . Pneumonia vaccines  Completed  *Topic was postponed. The date shown is not the original due date.   Preventive Care for Adults  A healthy lifestyle and preventive care can promote health and wellness. Preventive health guidelines for adults include the following key practices.  . A routine yearly physical is a good way to check with your health care provider about your health and preventive screening. It is a chance to share any concerns and updates on your health and to receive a thorough exam.  . Visit your dentist for a routine exam and preventive care every 6 months. Brush your teeth twice a day and floss once a day. Good oral hygiene prevents tooth decay and gum disease.  . The frequency of eye exams is based on your age, health, family medical history, use  of contact lenses, and other factors. Follow your health care provider's ecommendations for frequency of eye exams.  . Eat a healthy diet. Foods like vegetables, fruits, whole grains, low-fat dairy products, and lean protein foods contain the nutrients you need without too many calories. Decrease your intake of foods high in solid fats, added sugars, and salt. Eat the right amount of calories  for you. Get information about a proper diet from your health care provider, if necessary.  . Regular physical exercise is one of the most important things you can do for your health. Most adults should get at least 150 minutes of moderate-intensity exercise (any activity that increases your heart rate and causes you to sweat) each week. In addition, most adults need muscle-strengthening exercises on 2 or more days a week.  Silver Sneakers may be a benefit available to you. To determine eligibility, you may visit the website: www.silversneakers.com or contact program at (562)617-7426 Mon-Fri between 8AM-8PM.   . Maintain a healthy weight. The body mass index (BMI) is a screening tool to identify possible weight problems. It provides an estimate of body fat based on height and weight. Your health care provider can find your BMI and can help you achieve or maintain a healthy weight.   For adults 20 years and older: ? A BMI below 18.5 is considered underweight. ? A BMI of 18.5 to 24.9 is normal. ? A BMI of 25 to 29.9 is considered overweight. ? A BMI of 30 and above is considered obese.   . Maintain normal blood lipids and cholesterol levels by exercising and minimizing your intake of saturated fat. Eat a balanced diet with plenty of fruit and vegetables. Blood tests for lipids and cholesterol should begin at age 30 and  be repeated every 5 years. If your lipid or cholesterol levels are high, you are over 50, or you are at high risk for heart disease, you may need your cholesterol levels checked more frequently. Ongoing high lipid and cholesterol levels should be treated with medicines if diet and exercise are not working.  . If you smoke, find out from your health care provider how to quit. If you do not use tobacco, please do not start.  . If you choose to drink alcohol, please do not consume more than 2 drinks per day. One drink is considered to be 12 ounces (355 mL) of beer, 5 ounces (148 mL) of  wine, or 1.5 ounces (44 mL) of liquor.  . If you are 86-24 years old, ask your health care provider if you should take aspirin to prevent strokes.  . Use sunscreen. Apply sunscreen liberally and repeatedly throughout the day. You should seek shade when your shadow is shorter than you. Protect yourself by wearing long sleeves, pants, a wide-brimmed hat, and sunglasses year round, whenever you are outdoors.  . Once a month, do a whole body skin exam, using a mirror to look at the skin on your back. Tell your health care provider of new moles, moles that have irregular borders, moles that are larger than a pencil eraser, or moles that have changed in shape or color.
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
## 2017-02-12 NOTE — Progress Notes (Signed)
PCP notes:   Health maintenance:  Foot exam - PCP will address at next appt Eye exam - pt states he sees okay and does not need a visit Tetanus - pt declined A1C - completed Microalbumin - completed  Abnormal screenings:   Hearing - failed Fall risk - hx of multiple falls with injury Mini-Cog score: 17/20  Patient concerns:   None  Nurse concerns:  None  Next PCP appt:   02/19/17 @ 1600

## 2017-02-12 NOTE — Progress Notes (Signed)
Pre visit review using our clinic review tool, if applicable. No additional management support is needed unless otherwise documented below in the visit note. 

## 2017-02-12 NOTE — Progress Notes (Signed)
I reviewed health advisor's note, was available for consultation, and agree with documentation and plan.  

## 2017-02-17 ENCOUNTER — Other Ambulatory Visit: Payer: Self-pay | Admitting: Family Medicine

## 2017-02-19 ENCOUNTER — Encounter: Payer: Self-pay | Admitting: Family Medicine

## 2017-02-19 ENCOUNTER — Ambulatory Visit (INDEPENDENT_AMBULATORY_CARE_PROVIDER_SITE_OTHER): Payer: Medicare Other | Admitting: Family Medicine

## 2017-02-19 VITALS — BP 102/70 | HR 94 | Temp 97.8°F | Ht 66.5 in | Wt 204.8 lb

## 2017-02-19 DIAGNOSIS — N183 Chronic kidney disease, stage 3 unspecified: Secondary | ICD-10-CM

## 2017-02-19 DIAGNOSIS — I272 Pulmonary hypertension, unspecified: Secondary | ICD-10-CM

## 2017-02-19 DIAGNOSIS — I25810 Atherosclerosis of coronary artery bypass graft(s) without angina pectoris: Secondary | ICD-10-CM

## 2017-02-19 DIAGNOSIS — I10 Essential (primary) hypertension: Secondary | ICD-10-CM

## 2017-02-19 DIAGNOSIS — Z7189 Other specified counseling: Secondary | ICD-10-CM

## 2017-02-19 DIAGNOSIS — E1140 Type 2 diabetes mellitus with diabetic neuropathy, unspecified: Secondary | ICD-10-CM

## 2017-02-19 DIAGNOSIS — E7800 Pure hypercholesterolemia, unspecified: Secondary | ICD-10-CM | POA: Diagnosis not present

## 2017-02-19 DIAGNOSIS — J449 Chronic obstructive pulmonary disease, unspecified: Secondary | ICD-10-CM

## 2017-02-19 DIAGNOSIS — E538 Deficiency of other specified B group vitamins: Secondary | ICD-10-CM

## 2017-02-19 DIAGNOSIS — E1142 Type 2 diabetes mellitus with diabetic polyneuropathy: Secondary | ICD-10-CM | POA: Diagnosis not present

## 2017-02-19 DIAGNOSIS — I480 Paroxysmal atrial fibrillation: Secondary | ICD-10-CM

## 2017-02-19 MED ORDER — INSULIN GLARGINE 100 UNIT/ML ~~LOC~~ SOLN: 28.0000 [IU] | Freq: Every day | SUBCUTANEOUS | 11 refills | Status: DC

## 2017-02-19 MED ORDER — DILTIAZEM HCL 30 MG PO TABS: 30.0000 mg | ORAL_TABLET | Freq: Two times a day (BID) | ORAL | 3 refills | Status: DC

## 2017-02-19 NOTE — Patient Instructions (Addendum)
Start vitamin b12 10107mcg over the counter daily.  I would like you to check sugars and blood pressure a bit more frequently.  Insulin refilled.  I want to lower diltiazem dose to 30mg  twice daily - I have sent in new prescription for 30mg  tablets to take twice a day - #180 per 3 months sent to pharmacy.  Return in 6 months for follow up visit.   Health Maintenance, Male A healthy lifestyle and preventive care is important for your health and wellness. Ask your health care provider about what schedule of regular examinations is right for you. What should I know about weight and diet?  Eat a Healthy Diet  Eat plenty of vegetables, fruits, whole grains, low-fat dairy products, and lean protein.  Do not eat a lot of foods high in solid fats, added sugars, or salt. Maintain a Healthy Weight  Regular exercise can help you achieve or maintain a healthy weight. You should:  Do at least 150 minutes of exercise each week. The exercise should increase your heart rate and make you sweat (moderate-intensity exercise).  Do strength-training exercises at least twice a week. Watch Your Levels of Cholesterol and Blood Lipids  Have your blood tested for lipids and cholesterol every 5 years starting at 81 years of age. If you are at high risk for heart disease, you should start having your blood tested when you are 81 years old. You may need to have your cholesterol levels checked more often if:  Your lipid or cholesterol levels are high.  You are older than 81 years of age.  You are at high risk for heart disease. What should I know about cancer screening? Many types of cancers can be detected early and may often be prevented. Lung Cancer  You should be screened every year for lung cancer if:  You are a current smoker who has smoked for at least 30 years.  You are a former smoker who has quit within the past 15 years.  Talk to your health care provider about your screening options, when you  should start screening, and how often you should be screened. Colorectal Cancer  Routine colorectal cancer screening usually begins at 81 years of age and should be repeated every 5-10 years until you are 81 years old. You may need to be screened more often if early forms of precancerous polyps or small growths are found. Your health care provider may recommend screening at an earlier age if you have risk factors for colon cancer.  Your health care provider may recommend using home test kits to check for hidden blood in the stool.  A small camera at the end of a tube can be used to examine your colon (sigmoidoscopy or colonoscopy). This checks for the earliest forms of colorectal cancer. Prostate and Testicular Cancer  Depending on your age and overall health, your health care provider may do certain tests to screen for prostate and testicular cancer.  Talk to your health care provider about any symptoms or concerns you have about testicular or prostate cancer. Skin Cancer  Check your skin from head to toe regularly.  Tell your health care provider about any new moles or changes in moles, especially if:  There is a change in a mole's size, shape, or color.  You have a mole that is larger than a pencil eraser.  Always use sunscreen. Apply sunscreen liberally and repeat throughout the day.  Protect yourself by wearing long sleeves, pants, a wide-brimmed hat, and  sunglasses when outside. What should I know about heart disease, diabetes, and high blood pressure?  If you are 53-34 years of age, have your blood pressure checked every 3-5 years. If you are 44 years of age or older, have your blood pressure checked every year. You should have your blood pressure measured twice-once when you are at a hospital or clinic, and once when you are not at a hospital or clinic. Record the average of the two measurements. To check your blood pressure when you are not at a hospital or clinic, you can  use:  An automated blood pressure machine at a pharmacy.  A home blood pressure monitor.  Talk to your health care provider about your target blood pressure.  If you are between 44-48 years old, ask your health care provider if you should take aspirin to prevent heart disease.  Have regular diabetes screenings by checking your fasting blood sugar level.  If you are at a normal weight and have a low risk for diabetes, have this test once every three years after the age of 4.  If you are overweight and have a high risk for diabetes, consider being tested at a younger age or more often.  A one-time screening for abdominal aortic aneurysm (AAA) by ultrasound is recommended for men aged 1-75 years who are current or former smokers. What should I know about preventing infection? Hepatitis B  If you have a higher risk for hepatitis B, you should be screened for this virus. Talk with your health care provider to find out if you are at risk for hepatitis B infection. Hepatitis C  Blood testing is recommended for:  Everyone born from [birth year] through 1963.  Anyone with known risk factors for hepatitis C. Sexually Transmitted Diseases (STDs)  You should be screened each year for STDs including gonorrhea and chlamydia if:  You are sexually active and are younger than 81 years of age.  You are older than 81 years of age and your health care provider tells you that you are at risk for this type of infection.  Your sexual activity has changed since you were last screened and you are at an increased risk for chlamydia or gonorrhea. Ask your health care provider if you are at risk.  Talk with your health care provider about whether you are at high risk of being infected with HIV. Your health care provider may recommend a prescription medicine to help prevent HIV infection. What else can I do?  Schedule regular health, dental, and eye exams.  Stay current with your vaccines  (immunizations).  Do not use any tobacco products, such as cigarettes, chewing tobacco, and e-cigarettes. If you need help quitting, ask your health care provider.  Limit alcohol intake to no more than 2 drinks per day. One drink equals 12 ounces of beer, 5 ounces of wine, or 1 ounces of hard liquor.  Do not use street drugs.  Do not share needles.  Ask your health care provider for help if you need support or information about quitting drugs.  Tell your health care provider if you often feel depressed.  Tell your health care provider if you have ever been abused or do not feel safe at home. This information is not intended to replace advice given to you by your health care provider. Make sure you discuss any questions you have with your health care provider. Document Released: 04/03/2008 Document Revised: 06/04/2016 Document Reviewed: 07/10/2015 Elsevier Interactive Patient Education  2017 [employer]  

## 2017-02-19 NOTE — Progress Notes (Signed)
BP 102/70 (BP Location: Right Arm, Cuff Size: Large)   Pulse 94   Temp 97.8 F (36.6 C) (Oral)   Ht 5' 6.5" (1.689 m)   Wt 204 lb 12.8 oz (92.9 kg)   SpO2 95%   BMI 32.56 kg/m    CC: AMW f/u visit Subjective:    Patient ID: Eric [name], male    DOB: 12-02-1926, 83 y.o.   MRN: [id number]  HPI: Eric [name] is a 83 y.o. male presenting on 02/19/2017 for Annual Exam (Pt states he would like 90 day supply on his Cardizem. Pt state he has moved to [place] and may like to transfer care in the future due to travel.)   Moving to [place] with son (currently lives with son). He drove here today, with son. Son with hydrocephalus shunt, does not drive.   Saw cardiology 12/2016, note reviewed. Known persistent afib on low dose eliquis, CAD s/p 3v CABG 2010, HTN, HLD, DM, severe AR s/p AVR.   My office was recently on the phone with [facility] for total 56 minutes to help with patient's refills of diltiazem, glucotrol. He has difficulty with med refills, son helps him with medication administration.   Doesn't check blood pressure at home.  BP Readings from Last 3 Encounters:  02/19/17 102/70  02/12/17 120/80  01/12/17 112/65    DM - doesn't check sugars. Denies hypoglycemic symptoms of shaking, dizziness, passing out or falls. Hasn't fallen lately - uses cane regularly.  Lab Results  Component Value Date   HGBA1C 7.9 (H) 02/12/2017   Diabetic Foot Exam - Simple   Simple Foot Form Diabetic Foot exam was performed with the following findings:  Yes 02/19/2017  4:55 PM  Visual Inspection See comments:  Yes Sensation Testing Intact to touch and monofilament testing bilaterally:  Yes Pulse Check See comments:  Yes Comments Diminished DP bilaterally Hallux valgus bilaterally      Saw Lesia last month for medicare wellness visit. Note reviewed.  Failed hearing. Multiple falls with injury. Mini-cog 17/20.  Preventative: Colon screening - last colonoscopy in our  records 1997 with mult polyps. Aged out. Prostate screening - aged out. Flu shot - did not receive.  Pneumovax 2002, prevnar - 03/2015 Tetanus - 2002 Shingles vaccine - declines Advanced directives: Would want daughter to be HCPOA. Has living will at home in safe. Seat belt use discussed Sunscreen use discussed. No changing moles on skin.  Non smoker  Alcohol - occasional  Caffeine: 2-3 cups coffee  Lives with son  Retired [employer] officer, lives with 2 sons, 1 daughter in [place] Activity: no regular exercise   Relevant past medical, surgical, family and social history reviewed and updated as indicated. Interim medical history since our last visit reviewed. Allergies and medications reviewed and updated. Outpatient Medications Prior to Visit  Medication Sig Dispense Refill  . apixaban (ELIQUIS) 2.5 MG TABS tablet Take 1 tablet (2.5 mg total) by mouth 2 (two) times daily. 180 tablet 3  . atorvastatin (LIPITOR) 20 MG tablet TAKE 1 TABLET DAILY AT 6 P.M. 90 tablet 0  . famotidine (PEPCID) 20 MG tablet Take 1 tablet (20 mg total) by mouth daily. 90 tablet 3  . ferrous sulfate 325 (65 FE) MG tablet Take 1 tablet (325 mg total) by mouth daily with breakfast.    . glipiZIDE (GLUCOTROL) 10 MG tablet Take 1 tablet (10 mg total) by mouth daily before breakfast. 90 tablet 3  . Insulin Syringe-Needle U-100 (  B-D INS SYR ULTRAFINE 1CC/30G) 30G X 1/2" 1 ML MISC Use when taking insulin.Dx E11.40 100 each 5  . nitroGLYCERIN (NITROSTAT) 0.4 MG SL tablet Place 0.4 mg under the tongue every 5 (five) minutes as needed. Call MD/NP if pain unrelieved.    . vitamin B-12 (CYANOCOBALAMIN) 1000 MCG tablet Take 1 tablet (1,000 mcg total) by mouth daily.    Marland Kitchen diltiazem (CARDIZEM) 60 MG tablet Take 1/2 tablet (30mg ) in am and 1 tablet (60mg ) in pm. Hold if HR < 60/min SBP < 110 135 tablet 0  . insulin glargine (LANTUS) 100 UNIT/ML injection Inject 0.28 mLs (28 Units total) into the skin at bedtime. 30 mL  0   No facility-administered medications prior to visit.      Per HPI unless specifically indicated in ROS section below Review of Systems     Objective:    BP 102/70 (BP Location: Right Arm, Cuff Size: Large)   Pulse 94   Temp 97.8 F (36.6 C) (Oral)   Ht 5' 6.5" (1.689 m)   Wt 204 lb 12.8 oz (92.9 kg)   SpO2 95%   BMI 32.56 kg/m   Wt Readings from Last 3 Encounters:  02/19/17 204 lb 12.8 oz (92.9 kg)  02/12/17 206 lb 12 oz (93.8 kg)  01/12/17 207 lb 3.2 oz (94 kg)    Physical Exam  Constitutional: He appears well-developed and well-nourished. No distress.  HENT:  Head: Normocephalic and atraumatic.  Right Ear: External ear normal.  Left Ear: External ear normal.  Nose: Nose normal.  Mouth/Throat: Oropharynx is clear and moist. No oropharyngeal exudate.  Eyes: Conjunctivae and EOM are normal. Pupils are equal, round, and reactive to light. No scleral icterus.  Neck: Normal range of motion. Neck supple.  Cardiovascular: Normal rate, normal heart sounds and intact distal pulses.  An irregularly irregular rhythm present.  No murmur heard. Pulmonary/Chest: Effort normal and breath sounds normal. No respiratory distress. He has no wheezes. He has no rales.  Bibasilar crackles R>L  Musculoskeletal: He exhibits no edema.  See HPI for foot exam if done Bunions with hallux valgus deformity bilaterally  Lymphadenopathy:    He has no cervical adenopathy.  Skin: Skin is warm and dry. No rash noted.  Psychiatric: He has a normal mood and affect.  Nursing note and vitals reviewed.  Results for orders placed or performed in visit on 02/12/17  Comprehensive metabolic panel  Result Value Ref Range   Sodium 137 135 - 145 mEq/L   Potassium 4.2 3.5 - 5.1 mEq/L   Chloride 103 96 - 112 mEq/L   CO2 27 19 - 32 mEq/L   Glucose, Bld 211 (H) 70 - 99 mg/dL   BUN 20 6 - 23 mg/dL   Creatinine, Ser 1.81 (H) 0.40 - 1.50 mg/dL   Total Bilirubin 0.9 0.2 - 1.2 mg/dL   Alkaline Phosphatase 62  39 - 117 U/L   AST 16 0 - 37 U/L   ALT 9 0 - 53 U/L   Total Protein 6.9 6.0 - 8.3 g/dL   Albumin 4.0 3.5 - 5.2 g/dL   Calcium 8.9 8.4 - 10.5 mg/dL   GFR 37.78 (L) >60.00 mL/min  Lipid panel  Result Value Ref Range   Cholesterol 108 0 - 200 mg/dL   Triglycerides 187.0 (H) 0.0 - 149.0 mg/dL   HDL 38.30 (L) >39.00 mg/dL   VLDL 37.4 0.0 - 40.0 mg/dL   LDL Cholesterol 32 0 - 99 mg/dL   Total CHOL/HDL  Ratio 3    NonHDL 69.65   Hemoglobin A1c  Result Value Ref Range   Hgb A1c MFr Bld 7.9 (H) 4.6 - 6.5 %  Vitamin B12  Result Value Ref Range   Vitamin B-12 180 (L) 211 - 911 pg/mL  Microalbumin / creatinine urine ratio  Result Value Ref Range   Microalb, Ur 11.8 (H) 0.0 - 1.9 mg/dL   Creatinine,U 180.4 mg/dL   Microalb Creat Ratio 6.5 0.0 - 30.0 mg/g      Assessment & Plan:   Problem List Items Addressed This Visit    Advanced care planning/counseling discussion    Advanced directives: Would want daughter to be HCPOA. Has living will at home in safe.      CKD (chronic kidney disease) stage 3, GFR 30-59 ml/min    Some deteriorated. Will decrease diltiazem, recommended increased fluid intake.       COPD (chronic obstructive pulmonary disease) ([place])    Ex smoker 50 PY hx. Denies dyspnea. Not on respiratory medication.       Diabetic neuropathy associated with type 2 diabetes mellitus ([name]   Relevant Medications   insulin glargine (LANTUS) 100 UNIT/ML injection   Essential hypertension    Hypotensive without endorsed symptoms in office today (90 systolic, on recheck 660). I did suggest we continue to decrease diltiazem dose to 30mg  BID. He does not check blood pressures at home - encouraged he monitor more closely with lower dose.       Relevant Medications   diltiazem (CARDIZEM) 30 MG tablet   HYPERCHOLESTEROLEMIA    Chronic, stable on atorvastatin. LDL at goal. Encouraged increased fatty fish to help lower triglycerides.       Relevant Medications   diltiazem  (CARDIZEM) 30 MG tablet   Paroxysmal atrial fibrillation ([name]    chronic, stable. Continue low dose eliquis      Relevant Medications   diltiazem (CARDIZEM) 30 MG tablet   Pulmonary hypertension ([name]   Relevant Medications   diltiazem (CARDIZEM) 30 MG tablet   Type 2 diabetes, controlled, with neuropathy ([place]) - Primary    Chronic. Does not check sugars. Reviewed recent A1c. Encouraged he check sugars more frequently. Continue lantus 28u and glipizide 10mg  with breakfast. Denies hypoglycemic symptoms.      Relevant Medications   insulin glargine (LANTUS) 100 UNIT/ML injection   Vitamin B12 deficiency    Again reviewed diagnosis with patient, encouraged 1067mcg vit B12 daily. Pt declined B12 shot in office today.           Follow up plan: Return in about 6 months (around 08/22/2017) for follow up visit.  [name], MD    [address] Letta Kocher [phone]

## 2017-02-20 ENCOUNTER — Encounter: Payer: Self-pay | Admitting: Family Medicine

## 2017-02-20 NOTE — Assessment & Plan Note (Signed)
chronic, stable. Continue low dose eliquis

## 2017-02-20 NOTE — Assessment & Plan Note (Signed)
Some deteriorated. Will decrease diltiazem, recommended increased fluid intake.

## 2017-02-20 NOTE — Assessment & Plan Note (Addendum)
Chronic. Does not check sugars. Reviewed recent A1c. Encouraged he check sugars more frequently. Continue lantus 28u and glipizide 10mg  with breakfast. Denies hypoglycemic symptoms.

## 2017-02-20 NOTE — Assessment & Plan Note (Signed)
Advanced directives: Would want daughter to be HCPOA. Has living will at home in safe.

## 2017-02-20 NOTE — Assessment & Plan Note (Signed)
Again reviewed diagnosis with patient, encouraged 1054mcg vit B12 daily. Pt declined B12 shot in office today.

## 2017-02-20 NOTE — Assessment & Plan Note (Addendum)
Chronic, stable on atorvastatin. LDL at goal. Encouraged increased fatty fish to help lower triglycerides.

## 2017-02-20 NOTE — Assessment & Plan Note (Addendum)
Ex smoker 50 PY hx. Denies dyspnea. Not on respiratory medication.

## 2017-02-20 NOTE — Assessment & Plan Note (Signed)
Hypotensive without endorsed symptoms in office today (90 systolic, on recheck 786). I did suggest we continue to decrease diltiazem dose to 30mg  BID. He does not check blood pressures at home - encouraged he monitor more closely with lower dose.

## 2017-02-23 ENCOUNTER — Telehealth: Payer: Self-pay | Admitting: Family Medicine

## 2017-02-23 NOTE — Telephone Encounter (Signed)
Pt walked in and had concerns about B-12 gummy vitamins that he had taken and states that he had leg swelling after taking the B-12 twice.  States he has discontinued use of the medication.  Updated patient address and phone numbers.  Best number to call is [phone]

## 2017-02-24 NOTE — Telephone Encounter (Signed)
Noted. Will remove from med list and update allergies. Would call pt - would offer B12 shots.  Would also call and see how he's doing on lower diltiazem dose 30mg  BID (prior on 30mg  in am and 60mg  in pm)

## 2017-02-24 NOTE — Telephone Encounter (Signed)
Spoke to pt who states he is doing fine on the new dose of diltiazem. Pt is not wanting to receive B12 injections at this time

## 2017-04-01 ENCOUNTER — Emergency Department (HOSPITAL_COMMUNITY)
Admission: EM | Admit: 2017-04-01 | Discharge: 2017-04-02 | Disposition: A | Discharge status: Home / Self Care | Payer: Medicare Other | Attending: Emergency Medicine | Admitting: Emergency Medicine

## 2017-04-01 ENCOUNTER — Telehealth: Payer: Self-pay | Admitting: Family Medicine

## 2017-04-01 ENCOUNTER — Encounter (HOSPITAL_COMMUNITY): Payer: Self-pay

## 2017-04-01 DIAGNOSIS — E1140 Type 2 diabetes mellitus with diabetic neuropathy, unspecified: Secondary | ICD-10-CM | POA: Diagnosis not present

## 2017-04-01 DIAGNOSIS — Z794 Long term (current) use of insulin: Secondary | ICD-10-CM | POA: Diagnosis not present

## 2017-04-01 DIAGNOSIS — R609 Edema, unspecified: Secondary | ICD-10-CM | POA: Diagnosis not present

## 2017-04-01 DIAGNOSIS — Z8673 Personal history of transient ischemic attack (TIA), and cerebral infarction without residual deficits: Secondary | ICD-10-CM | POA: Diagnosis not present

## 2017-04-01 DIAGNOSIS — N183 Chronic kidney disease, stage 3 (moderate): Secondary | ICD-10-CM | POA: Insufficient documentation

## 2017-04-01 DIAGNOSIS — I129 Hypertensive chronic kidney disease with stage 1 through stage 4 chronic kidney disease, or unspecified chronic kidney disease: Secondary | ICD-10-CM | POA: Insufficient documentation

## 2017-04-01 DIAGNOSIS — Z79899 Other long term (current) drug therapy: Secondary | ICD-10-CM | POA: Diagnosis not present

## 2017-04-01 DIAGNOSIS — J449 Chronic obstructive pulmonary disease, unspecified: Secondary | ICD-10-CM | POA: Insufficient documentation

## 2017-04-01 DIAGNOSIS — Z951 Presence of aortocoronary bypass graft: Secondary | ICD-10-CM | POA: Diagnosis not present

## 2017-04-01 DIAGNOSIS — Z96642 Presence of left artificial hip joint: Secondary | ICD-10-CM | POA: Insufficient documentation

## 2017-04-01 DIAGNOSIS — R2243 Localized swelling, mass and lump, lower limb, bilateral: Secondary | ICD-10-CM | POA: Diagnosis present

## 2017-04-01 DIAGNOSIS — Z87891 Personal history of nicotine dependence: Secondary | ICD-10-CM | POA: Diagnosis not present

## 2017-04-01 DIAGNOSIS — R600 Localized edema: Secondary | ICD-10-CM | POA: Diagnosis not present

## 2017-04-01 LAB — I-STAT TROPONIN, ED: Troponin i, poc: 0.01 ng/mL (ref 0.00–0.08)

## 2017-04-01 LAB — CBC
HEMATOCRIT: 37.3 % — AB (ref 39.0–52.0)
Hemoglobin: 11.7 g/dL — ABNORMAL LOW (ref 13.0–17.0)
MCH: 29.5 pg (ref 26.0–34.0)
MCHC: 31.4 g/dL (ref 30.0–36.0)
MCV: 94.2 fL (ref 78.0–100.0)
Platelets: 161 10*3/uL (ref 150–400)
RBC: 3.96 MIL/uL — ABNORMAL LOW (ref 4.22–5.81)
RDW: 14 % (ref 11.5–15.5)
WBC: 5.5 10*3/uL (ref 4.0–10.5)

## 2017-04-01 LAB — BASIC METABOLIC PANEL
Anion gap: 7 (ref 5–15)
BUN: 14 mg/dL (ref 6–20)
CHLORIDE: 106 mmol/L (ref 101–111)
CO2: 22 mmol/L (ref 22–32)
Calcium: 8.6 mg/dL — ABNORMAL LOW (ref 8.9–10.3)
Creatinine, Ser: 1.68 mg/dL — ABNORMAL HIGH (ref 0.61–1.24)
GFR calc Af Amer: 40 mL/min — ABNORMAL LOW (ref >60)
GFR calc non Af Amer: 35 mL/min — ABNORMAL LOW (ref >60)
GLUCOSE: 161 mg/dL — AB (ref 65–99)
POTASSIUM: 3.7 mmol/L (ref 3.5–5.1)
Sodium: 135 mmol/L (ref 135–145)

## 2017-04-01 MED ORDER — FUROSEMIDE 20 MG PO TABS: 20.0000 mg | ORAL_TABLET | Freq: Every day | ORAL | 0 refills | Status: DC

## 2017-04-01 MED ORDER — FUROSEMIDE 20 MG PO TABS
20.0000 mg | ORAL_TABLET | Freq: Once | ORAL | Status: AC
  Administered 2017-04-01: 20 mg via ORAL
  Filled 2017-04-01: qty 1

## 2017-04-01 NOTE — Telephone Encounter (Signed)
[facility]     Patient Name: Eric [name] Initial Comment Patient experiencing pain in the left leg, knee is swollen   DOB: 06/06/1927      Nurse Assessment  Nurse: Arthor Captain RN, Margaret Date/Time (Eastern Time): 04/01/2017 3:46:31 PM  Confirm and document reason for call. If symptomatic, describe symptoms. ---Caller states his left knee is swollen and he is having leg pain. Calf is also swollen. Symptoms started a month ago. Denies fever.   Does the patient have any new or worsening symptoms? ---Yes   Will a triage be completed? ---Yes   Related visit to physician within the last 2 weeks? ---No   Does the PT have any chronic conditions? (i.e. diabetes, asthma, etc.) ---No   Is this a behavioral health or substance abuse call? ---No     Guidelines     Guideline Title Affirmed Question Affirmed Notes   Leg Pain [1] Thigh, calf, or ankle swelling AND [2] bilateral AND [3] 1 side is more swollen    Final Disposition User   See Physician within 4 Hours (or PCP triage) [place], RN, [facility] - ED   Disagree/Comply: Comply
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
## 2017-04-01 NOTE — Discharge Instructions (Signed)
You've been given a prescription for a fluid pill take this once a day in the morning.  Please call Dr. Stanford Breed, your cardiologist to make an appointment for further evaluation

## 2017-04-01 NOTE — ED Triage Notes (Signed)
Pt presents to the ed with complaints of fluid build up on his legs. Denies any chest pain or shortness of breath. Patient has had previous cardiac surgeries. States that he is also having hand numbness in his right hand for two months.

## 2017-04-02 ENCOUNTER — Telehealth: Payer: Self-pay | Admitting: Cardiology

## 2017-04-02 ENCOUNTER — Telehealth: Payer: Self-pay

## 2017-04-02 NOTE — Telephone Encounter (Signed)
s/w with patient and scheduled appointment for 04/13/17 with Kerin Ransom. Pt aware to call if he decides to cancel.   Pt asked for Fredia Beets to call to see if she can get him in earlier then September.

## 2017-04-02 NOTE — Telephone Encounter (Signed)
PLEASE NOTE: All timestamps contained within this report are represented as [place] Standard Time. CONFIDENTIALTY NOTICE: This fax transmission is intended only for the addressee. It contains information that is legally privileged, confidential or otherwise protected from use or disclosure. If you are not the intended recipient, you are strictly prohibited from reviewing, disclosing, copying using or disseminating any of this information or taking any action in reliance on or regarding this information. If you have received this fax in error, please notify us immediately by telephone so that we can arrange for its return to [place]. Phone: [phone], Toll-Free: [phone], Fax: [phone] Page: 1 of 1 Call Id: [id number] [place] Patient Name: Eric [name] Gender: Male DOB: 1927/08/10 Age: 83 Y 77 M 5 D Return Phone Number: [phone] (Primary), [phone] (Secondary) City/State/Zip: [place] Client [place] [facility] - Client Client Site [place] - Day Physician [name] - MD Who Is Calling Patient / Member / Family / Caregiver Call Type Triage / Clinical Relationship To Patient Self Return Phone Number [phone] (Secondary) Chief Complaint Leg Pain Reason for Call Symptomatic / Request for Health Information Initial Comment Patient experiencing pain in the left leg, knee is swollen Appointment Disposition EMR Appointment Not Necessary Info pasted into Epic Yes Nurse Assessment Nurse: Arthor Captain, RN, Margaret Date/Time (Eastern Time): 04/01/2017 3:46:31 PM Confirm and document reason for call. If symptomatic, describe symptoms. ---Caller states his left knee is swollen and he is having leg pain. Calf is also swollen. Symptoms started a month ago. Denies fever. Does the PT have any chronic conditions? (i.e. diabetes, asthma,  etc.) ---No Guidelines Guideline Title Affirmed Question Leg Pain [1] Thigh, calf, or ankle swelling AND [2] bilateral AND [3] 1 side is more swollen Disp. Time Eilene Ghazi Time) Disposition Final User 04/01/2017 3:55:35 PM See Physician within 4 Hours (or PCP triage) Yes Cockrum, RN, Joycelyn Schmid Referrals [facility] - ED Care Advice Given Per Guideline SEE PHYSICIAN WITHIN 4 HOURS (or PCP triage): * IF OFFICE WILL BE CLOSED AND NO PCP TRIAGE: You need to be seen within the next 3 or 4 hours. A nearby Urgent Care Center is often a good source of care. Another choice is to go to the ER. Go sooner if you become worse. CALL EMS IF: You develop any chest pain or shortness of breath. CARE ADVICE given per Leg Pain (Adult) guideline.

## 2017-04-02 NOTE — Telephone Encounter (Signed)
Per chart review tab pt was seen at [facility] ED on 06/01/17.

## 2017-04-02 NOTE — Telephone Encounter (Signed)
New message     Pt is refusing to see APP, he was just discharged from ER and they want him to see Dr Stanford Breed,  He does not feel he can wait until September

## 2017-04-02 NOTE — Telephone Encounter (Signed)
Note reviewed.

## 2017-04-02 NOTE — Telephone Encounter (Signed)
This note is difficult to read and has already been sent to Dr Darnell Level thru [facility] portal.

## 2017-04-03 NOTE — Telephone Encounter (Signed)
Can schedule pt if there is another cancellation Eric [name]

## 2017-04-10 NOTE — Telephone Encounter (Signed)
Spoke with pt, aware there have been no cancellations, he was advised to keep his appt Monday as scheduled.

## 2017-04-13 ENCOUNTER — Other Ambulatory Visit: Payer: Self-pay | Admitting: Pharmacist Clinician (PhC)/ Clinical Pharmacy Specialist

## 2017-04-13 ENCOUNTER — Encounter: Payer: Self-pay | Admitting: Cardiology

## 2017-04-13 ENCOUNTER — Ambulatory Visit (INDEPENDENT_AMBULATORY_CARE_PROVIDER_SITE_OTHER): Payer: Medicare Other | Admitting: Cardiology

## 2017-04-13 VITALS — BP 163/94 | HR 89 | Ht 66.5 in | Wt 214.0 lb

## 2017-04-13 DIAGNOSIS — N183 Chronic kidney disease, stage 3 unspecified: Secondary | ICD-10-CM

## 2017-04-13 DIAGNOSIS — Z8673 Personal history of transient ischemic attack (TIA), and cerebral infarction without residual deficits: Secondary | ICD-10-CM | POA: Diagnosis not present

## 2017-04-13 DIAGNOSIS — I10 Essential (primary) hypertension: Secondary | ICD-10-CM | POA: Diagnosis not present

## 2017-04-13 DIAGNOSIS — E118 Type 2 diabetes mellitus with unspecified complications: Secondary | ICD-10-CM

## 2017-04-13 DIAGNOSIS — I5031 Acute diastolic (congestive) heart failure: Secondary | ICD-10-CM

## 2017-04-13 DIAGNOSIS — Z952 Presence of prosthetic heart valve: Secondary | ICD-10-CM

## 2017-04-13 DIAGNOSIS — Z951 Presence of aortocoronary bypass graft: Secondary | ICD-10-CM | POA: Diagnosis not present

## 2017-04-13 DIAGNOSIS — I480 Paroxysmal atrial fibrillation: Secondary | ICD-10-CM | POA: Diagnosis not present

## 2017-04-13 DIAGNOSIS — I25810 Atherosclerosis of coronary artery bypass graft(s) without angina pectoris: Secondary | ICD-10-CM

## 2017-04-13 DIAGNOSIS — Z794 Long term (current) use of insulin: Secondary | ICD-10-CM | POA: Diagnosis not present

## 2017-04-13 DIAGNOSIS — IMO0001 Reserved for inherently not codable concepts without codable children: Secondary | ICD-10-CM

## 2017-04-13 MED ORDER — APIXABAN 2.5 MG PO TABS: 2.5000 mg | ORAL_TABLET | Freq: Two times a day (BID) | ORAL | 1 refills | Status: DC

## 2017-04-13 MED ORDER — NITROGLYCERIN 0.4 MG SL SUBL: 0.4000 mg | SUBLINGUAL_TABLET | SUBLINGUAL | 3 refills | Status: DC | PRN

## 2017-04-13 NOTE — Assessment & Plan Note (Signed)
B/P running high but pt was upset he wasn't seeing Dr Stanford Breed

## 2017-04-13 NOTE — Assessment & Plan Note (Signed)
H/o CVA- Remote right PCA territory infarct, with additional small remote bilateral cerebellar infarcts.

## 2017-04-13 NOTE — Assessment & Plan Note (Signed)
GFR =35

## 2017-04-13 NOTE — Assessment & Plan Note (Signed)
He still appears volume overloaded

## 2017-04-13 NOTE — Progress Notes (Signed)
04/13/2017 Eric [name]   08/06/1927  [id number]  Primary Physician [name], MD Primary Cardiologist: Dr Stanford Breed  HPI:  83 y/o male here with son to see Dr Stanford Breed for a f/u visit from the ED 04/01/17 for LE edema. Unfortunatly there were no openings on Dr Jacalyn Lefevre schedule and I am seeing him today. The pt has a history of coronary disease and severe aortic stenosis. On December 22 2008 he underwent aortic valve replacement with #25-mm [facility] Ease pericardial tissue valve as well as coronary artery bypassing graft with a LIMA to the LAD, saphenous vein graft to the circ marginal and the saphenous vein graft to the PDA. The patient was seen in the hospital March 2017 with new-onset atrial fibrillation, pneumonia and endocarditis. He received therapy for his endocarditis with antibiotics.  Last echocardiogram May 2017 showed normal LV systolic function, mitral annular calcification with mild mitral stenosis and mild mitral regurgitation, mild left atrial enlargement and mild tricuspid regurgitation.   He presented to the ED 04/01/17 with rt hand numbness and also complained of LE edema which the patient attributed to B 12 shots (which he stopped). No CXR or BNP was done. He was given Lasix 40 mg IV in the ED and discharged on Lasix 20 mg daily. He denies any increased DOE. He still has 1+ LE edema. He admitted to me he has been eating potato chips. His wgt in March 2018 was 204 lbs, now 214 lbs.   Current Outpatient Prescriptions  Medication Sig Dispense Refill  . apixaban (ELIQUIS) 2.5 MG TABS tablet Take 1 tablet (2.5 mg total) by mouth 2 (two) times daily. 180 tablet 1  . atorvastatin (LIPITOR) 20 MG tablet TAKE 1 TABLET DAILY AT 6 P.M. 90 tablet 0  . diltiazem (CARDIZEM) 30 MG tablet Take 1 tablet (30 mg total) by mouth 2 (two) times daily. 180 tablet 3  . famotidine (PEPCID) 20 MG tablet Take 1 tablet (20 mg total) by mouth daily. 90 tablet 3  . ferrous sulfate 325 (65 FE)  MG tablet Take 1 tablet (325 mg total) by mouth daily with breakfast.    . furosemide (LASIX) 20 MG tablet Take 1 tablet (20 mg total) by mouth daily. 5 tablet 0  . furosemide (LASIX) 20 MG tablet Take 1 tablet (20 mg total) by mouth daily. 5 tablet 0  . glipiZIDE (GLUCOTROL) 10 MG tablet Take 1 tablet (10 mg total) by mouth daily before breakfast. 90 tablet 3  . insulin glargine (LANTUS) 100 UNIT/ML injection Inject 0.28 mLs (28 Units total) into the skin at bedtime. 30 mL 11  . Insulin Syringe-Needle U-100 (B-D INS SYR ULTRAFINE 1CC/30G) 30G X 1/2" 1 ML MISC Use when taking insulin.Dx E11.40 100 each 5  . nitroGLYCERIN (NITROSTAT) 0.4 MG SL tablet Place 1 tablet (0.4 mg total) under the tongue every 5 (five) minutes as needed. Call MD/NP if pain unrelieved. 25 tablet 3  . tetrahydrozoline-zinc (VISINE-AC) 0.05-0.25 % ophthalmic solution Place 2 drops into both eyes as needed.     No current facility-administered medications for this visit.     Allergies  Allergen Reactions  . Cyanocobalamin [Vitamin B12] Other (See Comments)    Leg swelling to gummy B12 vitamin  . Ampicillin Rash    Skin rash    Past Medical History:  Diagnosis Date  . Acute endocarditis   . Bilateral hydrocele 3/11   [facility]  . Cerebrovascular disease, unspecified   . Cervicalgia   .  Colon polyp   . COPD (chronic obstructive pulmonary disease) ([place]) 2017   dx during hospitalization  . Coronary atherosclerosis of unspecified type of vessel, native or graft    s/p CABG 2010  . Esophageal reflux   . Essential hypertension, benign   . Foraminal stenosis of cervical region    bilateral-C4-C5, C5-C6  . History of aortic stenosis    s/p valve replacment 2010  . Pure hypercholesterolemia   . Shingles 07/27/2012  . Stroke ([place])   . Type II or unspecified type diabetes mellitus with neurological manifestations, not stated as uncontrolled(250.60)   . 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 Marital status: Widowed    Spouse name: N/A  . Number of children: 3  . Years of education: N/A   Occupational History  . retired-truck driver    Social History Main Topics  . Smoking status: Former Smoker    Packs/day: 1.00    Years: 50.00    Types: Cigarettes    Quit date: 10/20/1993  . Smokeless tobacco: Never Used  . Alcohol use 0.6 oz/week    1 Cans of beer per week     Comment: occasional  . Drug use: No  . Sexual activity: No   Other Topics Concern  . 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     Family History  Problem Relation Age of Onset  . Heart attack Father 87  . Breast cancer Mother 54  . Brain cancer Sister   . Prostate cancer Brother   . Diabetes Neg Hx   . Stroke Neg Hx      Review of Systems: General: negative for chills, fever, night sweats or weight changes.  Cardiovascular: negative for chest pain, dyspnea on exertion, edema, orthopnea, palpitations, paroxysmal nocturnal dyspnea or shortness of breath Dermatological: negative for rash Respiratory: negative for cough or wheezing Urologic: negative for hematuria Abdominal: negative for nausea, vomiting, diarrhea, bright red blood per rectum, melena, or hematemesis Neurologic: negative for visual changes, syncope, or dizziness All other systems reviewed and are otherwise negative except as noted above.    Blood pressure (!) 163/94, pulse 89, height 5' 6.5" (1.689 m), weight 214 lb (97.1 kg).  General appearance: alert, cooperative, no distress and HOH Neck: no JVD Lungs: clear to auscultation bilaterally Heart: irregularly irregular rhythm and systolic murmur LSB Extremities: 1+ LE  edema Neurologic: Grossly normal  EKG 04/01/17- AF with VR 90  ASSESSMENT AND PLAN:   Acute diastolic (congestive) heart failure ([name] He still appears volume overloaded  S/P CABG (coronary artery bypass graft) No angina but he asked for SL NTG refill  S/P AVR (aortic valve replacement) S/P tissue AVR 2010  Paroxysmal atrial fibrillation ([name] No in atrial fibrillation with CVR  History of CVA (cerebrovascular accident) H/o CVA- Remote right PCA territory infarct, with additional small remote bilateral cerebellar infarcts.  Essential hypertension B/P running high but pt was upset he wasn't seeing Dr Stanford Breed  CKD (chronic kidney disease) stage 3, GFR 30-59 ml/min GFR 35  Insulin dependent diabetes mellitus with complications ([name] Goal A1c <6.5% if tolerated given h/o stroke  PLAN  I suggested the pt take Lasix 40 mg daily x 3 days, then cut back to 20 mg daily. I strongly urged him to avoid salty foods. I would like to get an echo and get him in to see Dr Stanford Breed in follow up.   Kerin Ransom PA-C 04/13/2017 4:41 PM

## 2017-04-13 NOTE — Patient Instructions (Addendum)
Medication Instructions:  INCREASE Lasix to 40mg  (2 tablets) daily for 3 days, then resume 20mg  (1 tablet) daily.    If your weight exceeds 215lbs, please call our office.   Labwork: NONE  Testing/Procedures: Your physician has requested that you have an echocardiogram. Echocardiography is a painless test that uses sound waves to create images of your heart. It provides your doctor with information about the size and shape of your heart and how well your heart's chambers and valves are working. This procedure takes approximately one hour. There are no restrictions for this procedure. -this will be completed at our [facility]: [place]: Your physician recommends that you schedule a follow-up appointment in: 4 weeks with Dr. Stanford Breed.  Any Other Special Instructions Will Be Listed Below (If Applicable).     If you need a refill on your cardiac medications before your next appointment, please call your pharmacy.

## 2017-04-13 NOTE — Assessment & Plan Note (Signed)
Goal A1c <6.5% if tolerated given h/o stroke

## 2017-04-13 NOTE — Assessment & Plan Note (Signed)
No angina but he asked for SL NTG refill

## 2017-04-13 NOTE — Assessment & Plan Note (Signed)
No in atrial fibrillation with CVR

## 2017-04-13 NOTE — Assessment & Plan Note (Signed)
S/P tissue AVR 2010

## 2017-04-23 ENCOUNTER — Other Ambulatory Visit: Payer: Self-pay

## 2017-04-23 ENCOUNTER — Ambulatory Visit (HOSPITAL_COMMUNITY): Payer: Medicare Other | Attending: Cardiology

## 2017-04-23 DIAGNOSIS — I130 Hypertensive heart and chronic kidney disease with heart failure and stage 1 through stage 4 chronic kidney disease, or unspecified chronic kidney disease: Secondary | ICD-10-CM | POA: Insufficient documentation

## 2017-04-23 DIAGNOSIS — N183 Chronic kidney disease, stage 3 (moderate): Secondary | ICD-10-CM | POA: Diagnosis not present

## 2017-04-23 DIAGNOSIS — E1122 Type 2 diabetes mellitus with diabetic chronic kidney disease: Secondary | ICD-10-CM | POA: Diagnosis not present

## 2017-04-23 DIAGNOSIS — I340 Nonrheumatic mitral (valve) insufficiency: Secondary | ICD-10-CM | POA: Insufficient documentation

## 2017-04-23 DIAGNOSIS — I371 Nonrheumatic pulmonary valve insufficiency: Secondary | ICD-10-CM | POA: Diagnosis not present

## 2017-04-23 DIAGNOSIS — I5031 Acute diastolic (congestive) heart failure: Secondary | ICD-10-CM | POA: Insufficient documentation

## 2017-04-23 DIAGNOSIS — I6529 Occlusion and stenosis of unspecified carotid artery: Secondary | ICD-10-CM | POA: Diagnosis not present

## 2017-04-23 DIAGNOSIS — I420 Dilated cardiomyopathy: Secondary | ICD-10-CM | POA: Diagnosis not present

## 2017-04-23 DIAGNOSIS — Z8673 Personal history of transient ischemic attack (TIA), and cerebral infarction without residual deficits: Secondary | ICD-10-CM | POA: Diagnosis not present

## 2017-04-23 DIAGNOSIS — E785 Hyperlipidemia, unspecified: Secondary | ICD-10-CM | POA: Insufficient documentation

## 2017-04-23 DIAGNOSIS — I4891 Unspecified atrial fibrillation: Secondary | ICD-10-CM | POA: Diagnosis not present

## 2017-04-23 DIAGNOSIS — I272 Pulmonary hypertension, unspecified: Secondary | ICD-10-CM | POA: Diagnosis not present

## 2017-04-23 DIAGNOSIS — J449 Chronic obstructive pulmonary disease, unspecified: Secondary | ICD-10-CM | POA: Diagnosis not present

## 2017-04-23 DIAGNOSIS — I509 Heart failure, unspecified: Secondary | ICD-10-CM | POA: Diagnosis not present

## 2017-04-23 DIAGNOSIS — Z953 Presence of xenogenic heart valve: Secondary | ICD-10-CM | POA: Diagnosis not present

## 2017-04-23 MED ORDER — PERFLUTREN LIPID MICROSPHERE
1.0000 mL | INTRAVENOUS | Status: AC | PRN
  Administered 2017-04-23: 2 mL via INTRAVENOUS

## 2017-05-07 ENCOUNTER — Other Ambulatory Visit: Payer: Self-pay | Admitting: Family Medicine

## 2017-05-18 ENCOUNTER — Other Ambulatory Visit: Payer: Self-pay | Admitting: Family Medicine

## 2017-05-21 ENCOUNTER — Encounter: Payer: Self-pay | Admitting: Cardiology

## 2017-05-21 ENCOUNTER — Ambulatory Visit (INDEPENDENT_AMBULATORY_CARE_PROVIDER_SITE_OTHER): Payer: Medicare Other | Admitting: Cardiology

## 2017-05-21 VITALS — BP 134/74 | HR 77 | Ht 66.5 in | Wt 220.0 lb

## 2017-05-21 DIAGNOSIS — Z952 Presence of prosthetic heart valve: Secondary | ICD-10-CM

## 2017-05-21 DIAGNOSIS — I481 Persistent atrial fibrillation: Secondary | ICD-10-CM | POA: Diagnosis not present

## 2017-05-21 DIAGNOSIS — I25810 Atherosclerosis of coronary artery bypass graft(s) without angina pectoris: Secondary | ICD-10-CM

## 2017-05-21 DIAGNOSIS — Z951 Presence of aortocoronary bypass graft: Secondary | ICD-10-CM

## 2017-05-21 DIAGNOSIS — I714 Abdominal aortic aneurysm, without rupture, unspecified: Secondary | ICD-10-CM

## 2017-05-21 DIAGNOSIS — I4819 Other persistent atrial fibrillation: Secondary | ICD-10-CM

## 2017-05-21 DIAGNOSIS — I5031 Acute diastolic (congestive) heart failure: Secondary | ICD-10-CM

## 2017-05-21 DIAGNOSIS — I38 Endocarditis, valve unspecified: Secondary | ICD-10-CM

## 2017-05-21 MED ORDER — FUROSEMIDE 20 MG PO TABS
40.0000 mg | ORAL_TABLET | Freq: Every day | ORAL | 3 refills | Status: DC
Start: 2017-05-21 — End: 2017-08-31

## 2017-05-21 NOTE — Patient Instructions (Signed)
Medication Instructions:   INCREASE FUROSEMIDE TO 40 MG ONCE DAILY= 2 OF THE 20 MG TABLETS ONCE DAILY  Labwork:  Your physician recommends that you return for lab work in: [place]  Testing/Procedures:  Your physician has requested that you have an abdominal aorta duplex. During this test, an ultrasound is used to evaluate the aorta. Allow 30 minutes for this exam. Do not eat after midnight the day before and avoid carbonated beverages   Follow-Up:  Your physician recommends that you schedule a follow-up appointment in: [place]   If you need a refill on your cardiac medications before your next appointment, please call your pharmacy.

## 2017-05-21 NOTE — Progress Notes (Signed)
HPI: FU AVR and coronary artery disease. His previous workup showed three-vessel coronary disease and severe aortic stenosis. The patient therefore on December 22 2008, underwent aortic valve replacement with #25-mm [facility] Ease pericardial tissue valve as well as coronary artery bypassing graft with a LIMA to the LAD, saphenous vein graft to the circ marginal and the saphenous vein graft to the PDA. Patient seen in the hospital March 2017 with new-onset atrial fibrillation, pneumonia and endocarditis. He received therapy for his endocarditis with antibiotics. Carotid Dopplers in March 2017 showed 1-39 bilateral stenosis. Transesophageal echocardiogram March 2017 showed normal LV systolic function, bioprosthetic aortic valve, moderate to severe mitral annular calcification with mild mitral stenosis, moderate mitral regurgitation and a 2 cm vegetation attached to the posterior mitral valve leaflet. Mild biatrial enlargement. Abdominal CT April 2017 showed abdominal aortic aneurysm 3.2 cm. Holter monitor September 2017 showed atrial fibrillation with PVCs or aberrantly conducted beats rate controlled. Echocardiogram July 2018 showed normal LV systolic function, bioprosthetic aortic valve with normal gradient and no AI, trivial mitral stenosis with mean gradient 5 mmHg and biatrial enlargement. Patient seen recently for acute on chronic diastolic congestive heart failure and diuretics increased. Since I last saw him, he has some dyspnea on exertion but no orthopnea or PND. Mild pedal edema. No chest pain or syncope.  Current Outpatient Prescriptions  Medication Sig Dispense Refill  . apixaban (ELIQUIS) 2.5 MG TABS tablet Take 1 tablet (2.5 mg total) by mouth 2 (two) times daily. 180 tablet 1  . atorvastatin (LIPITOR) 20 MG tablet TAKE 1 TABLET DAILY AT 6 P.M. 90 tablet 1  . diltiazem (CARDIZEM) 30 MG tablet Take 1 tablet (30 mg total) by mouth 2 (two) times daily. 180 tablet 3  . famotidine (PEPCID)  20 MG tablet Take 1 tablet (20 mg total) by mouth daily. 90 tablet 3  . ferrous sulfate 325 (65 FE) MG tablet Take 1 tablet (325 mg total) by mouth daily with breakfast.    . furosemide (LASIX) 20 MG tablet Take 1 tablet (20 mg total) by mouth daily. 5 tablet 0  . furosemide (LASIX) 20 MG tablet Take 1 tablet (20 mg total) by mouth daily. 5 tablet 0  . glipiZIDE (GLUCOTROL) 10 MG tablet TAKE 1 TABLET DAILY BEFORE BREAKFAST 90 tablet 3  . insulin glargine (LANTUS) 100 UNIT/ML injection Inject 0.28 mLs (28 Units total) into the skin at bedtime. 30 mL 11  . Insulin Syringe-Needle U-100 (B-D INS SYR ULTRAFINE 1CC/30G) 30G X 1/2" 1 ML MISC Use when taking insulin.Dx E11.40 100 each 5  . nitroGLYCERIN (NITROSTAT) 0.4 MG SL tablet Place 1 tablet (0.4 mg total) under the tongue every 5 (five) minutes as needed. Call MD/NP if pain unrelieved. 25 tablet 3  . tetrahydrozoline-zinc (VISINE-AC) 0.05-0.25 % ophthalmic solution Place 2 drops into both eyes as needed.     No current facility-administered medications for this visit.      Past Medical History:  Diagnosis Date  . Acute endocarditis   . Bilateral hydrocele 3/11   [facility]  . Cerebrovascular disease, unspecified   . Cervicalgia   . Colon polyp   . COPD (chronic obstructive pulmonary disease) ([place]) 2017   dx during hospitalization  . Coronary atherosclerosis of unspecified type of vessel, native or graft    s/p CABG 2010  . Esophageal reflux   . Essential hypertension, benign   . Foraminal stenosis of cervical region    bilateral-C4-C5, C5-C6  . History  of aortic stenosis    s/p valve replacment 2010  . Pure hypercholesterolemia   . Shingles 07/27/2012  . Stroke ([place])   . Type II or unspecified type diabetes mellitus with neurological manifestations, not stated as uncontrolled(250.60)   . 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 AORTIC VALVE REPLACEMENT  12/2008   with pericardial tissue valve  . Carotid US  10/2009   B ICA stenosis, stable disease, rec rpt 2 years  . CATARACT EXTRACTION  1990's   bilateral  . CHOLECYSTECTOMY  1995  . COLONOSCOPY  [place]  . CORONARY ARTERY BYPASS GRAFT  3/10   x3-using a left internal mammary artery to the left anterior descending coronary artery, saphenous vein graft to circumflex marginal branch, spahenous vein graft  to posterior descendingcoronary artery. Endoscopic saphenous vein harvest from bilateral thighs was done.   Marland Kitchen HYDROCELE EXCISION     bilateral ([facility])  . L leg trauma  1957   truck over left leg  . PTCA  12/99   with stent  . sphincterectomy  09/20/03   for jaundice  . TEE WITHOUT CARDIOVERSION N/A 01/09/2016   Procedure: TRANSESOPHAGEAL ECHOCARDIOGRAM (TEE);  Surgeon: Dorothy Spark, MD;  Location: [place];  Service: Cardiovascular;  Laterality: N/A;  . TEE WITHOUT CARDIOVERSION N/A 01/15/2016   Procedure: TRANSESOPHAGEAL ECHOCARDIOGRAM (TEE);  Surgeon: Larey Dresser, MD;  Location: [place];  Service: Cardiovascular;  Laterality: N/A;  . TOTAL HIP ARTHROPLASTY Left 06/30/2014   Procedure: LEFT TOTAL HIP ARTHROPLASTY ANTERIOR APPROACH;  Surgeon: Mcarthur Rossetti, MD;  Location: [facility];  Service: Orthopedics;  Laterality: Left;    Social History   Social History  . Marital status: Widowed    Spouse name: N/A  . Number of children: 3  . Years of education: N/A   Occupational History  . retired-truck driver    Social History Main Topics  . Smoking status: Former Smoker    Packs/day: 1.00    Years: 50.00    Types: Cigarettes    Quit date: 10/20/1993  . Smokeless tobacco: Never Used  . Alcohol use 0.6 oz/week    1 Cans of beer per week     Comment: occasional  . Drug use: No  . Sexual activity: No   Other Topics Concern  . 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    Family History  Problem Relation Age of Onset  . Heart attack Father 19  . Breast cancer Mother 70  . Brain cancer Sister   . Prostate cancer Brother   . Diabetes Neg Hx   . Stroke Neg Hx     ROS: no fevers or chills, productive cough, hemoptysis, dysphasia, odynophagia, melena, hematochezia, dysuria, hematuria, rash, seizure activity, orthopnea, PND, claudication. Remaining systems are negative.  Physical Exam: Well-developed well-nourished in no acute distress.  Skin is warm and dry.  HEENT is normal.  Neck is supple.  Chest is clear to auscultation with normal expansion.  Cardiovascular exam is irregular Abdominal exam nontender or distended. No masses palpated. Extremities show 1+ ankle edema. neuro  grossly intact   A/P  1 Atrial fibrillation-continue present medications for rate control. Continue apixaban.  2 coronary artery disease-continue statin. He is not on aspirin given need for anticoagulation.  3 hyperlipidemia-continue statin.  4 status post aortic valve replacement-continue SBE prophylaxis. He has had a previous bout of endocarditis.  5 hypertension-blood pressure is controlled. Continue present medications.  6 carotid artery disease-continue statin.  7 AAA-schedule fu ultrasound.  8 Chronic diastolic congestive heart failure-mildly volume overloaded. Increase Lasix to 40 mg daily. Check potassium and renal function in 1 week.    Kirk Ruths, MD

## 2017-06-09 ENCOUNTER — Ambulatory Visit (HOSPITAL_COMMUNITY)
Admission: RE | Admit: 2017-06-09 | Discharge: 2017-06-09 | Disposition: A | Discharge status: Home / Self Care | Payer: Medicare Other | Source: Ambulatory Visit | Attending: Cardiology | Admitting: Cardiology

## 2017-06-09 DIAGNOSIS — I714 Abdominal aortic aneurysm, without rupture, unspecified: Secondary | ICD-10-CM

## 2017-06-11 ENCOUNTER — Emergency Department (HOSPITAL_COMMUNITY)
Admission: EM | Admit: 2017-06-11 | Discharge: 2017-06-11 | Disposition: A | Discharge status: Home / Self Care | Payer: Medicare Other | Attending: Emergency Medicine | Admitting: Emergency Medicine

## 2017-06-11 ENCOUNTER — Emergency Department (HOSPITAL_COMMUNITY): Payer: Medicare Other

## 2017-06-11 ENCOUNTER — Encounter (HOSPITAL_COMMUNITY): Payer: Self-pay | Admitting: Emergency Medicine

## 2017-06-11 DIAGNOSIS — G6289 Other specified polyneuropathies: Secondary | ICD-10-CM | POA: Diagnosis not present

## 2017-06-11 DIAGNOSIS — I5030 Unspecified diastolic (congestive) heart failure: Secondary | ICD-10-CM | POA: Diagnosis not present

## 2017-06-11 DIAGNOSIS — I2510 Atherosclerotic heart disease of native coronary artery without angina pectoris: Secondary | ICD-10-CM | POA: Diagnosis not present

## 2017-06-11 DIAGNOSIS — F29 Unspecified psychosis not due to a substance or known physiological condition: Secondary | ICD-10-CM | POA: Diagnosis not present

## 2017-06-11 DIAGNOSIS — E1122 Type 2 diabetes mellitus with diabetic chronic kidney disease: Secondary | ICD-10-CM | POA: Insufficient documentation

## 2017-06-11 DIAGNOSIS — Z87891 Personal history of nicotine dependence: Secondary | ICD-10-CM | POA: Diagnosis not present

## 2017-06-11 DIAGNOSIS — Z794 Long term (current) use of insulin: Secondary | ICD-10-CM | POA: Insufficient documentation

## 2017-06-11 DIAGNOSIS — I130 Hypertensive heart and chronic kidney disease with heart failure and stage 1 through stage 4 chronic kidney disease, or unspecified chronic kidney disease: Secondary | ICD-10-CM | POA: Diagnosis not present

## 2017-06-11 DIAGNOSIS — J449 Chronic obstructive pulmonary disease, unspecified: Secondary | ICD-10-CM | POA: Diagnosis not present

## 2017-06-11 DIAGNOSIS — Z79899 Other long term (current) drug therapy: Secondary | ICD-10-CM | POA: Insufficient documentation

## 2017-06-11 DIAGNOSIS — J80 Acute respiratory distress syndrome: Secondary | ICD-10-CM | POA: Diagnosis not present

## 2017-06-11 DIAGNOSIS — R208 Other disturbances of skin sensation: Secondary | ICD-10-CM | POA: Diagnosis present

## 2017-06-11 DIAGNOSIS — I639 Cerebral infarction, unspecified: Secondary | ICD-10-CM | POA: Diagnosis not present

## 2017-06-11 DIAGNOSIS — J441 Chronic obstructive pulmonary disease with (acute) exacerbation: Secondary | ICD-10-CM | POA: Diagnosis not present

## 2017-06-11 DIAGNOSIS — N183 Chronic kidney disease, stage 3 (moderate): Secondary | ICD-10-CM | POA: Diagnosis not present

## 2017-06-11 DIAGNOSIS — R442 Other hallucinations: Secondary | ICD-10-CM | POA: Diagnosis not present

## 2017-06-11 DIAGNOSIS — Z7901 Long term (current) use of anticoagulants: Secondary | ICD-10-CM | POA: Insufficient documentation

## 2017-06-11 HISTORY — DX: Type 2 diabetes mellitus without complications: E11.9

## 2017-06-11 LAB — CBC WITH DIFFERENTIAL/PLATELET
Basophils Absolute: 0 10*3/uL (ref 0.0–0.1)
Basophils Relative: 0 %
EOS ABS: 0.2 10*3/uL (ref 0.0–0.7)
Eosinophils Relative: 4 %
HEMATOCRIT: 35.6 % — AB (ref 39.0–52.0)
HEMOGLOBIN: 11.8 g/dL — AB (ref 13.0–17.0)
LYMPHS ABS: 0.9 10*3/uL (ref 0.7–4.0)
Lymphocytes Relative: 15 %
MCH: 30.9 pg (ref 26.0–34.0)
MCHC: 33.1 g/dL (ref 30.0–36.0)
MCV: 93.2 fL (ref 78.0–100.0)
MONO ABS: 0.7 10*3/uL (ref 0.1–1.0)
MONOS PCT: 11 %
Neutro Abs: 4.3 10*3/uL (ref 1.7–7.7)
Neutrophils Relative %: 70 %
Platelets: 173 10*3/uL (ref 150–400)
RBC: 3.82 MIL/uL — ABNORMAL LOW (ref 4.22–5.81)
RDW: 13.5 % (ref 11.5–15.5)
WBC: 6.1 10*3/uL (ref 4.0–10.5)

## 2017-06-11 LAB — URINALYSIS, ROUTINE W REFLEX MICROSCOPIC
BACTERIA UA: NONE SEEN
Bilirubin Urine: NEGATIVE
Glucose, UA: NEGATIVE mg/dL
KETONES UR: NEGATIVE mg/dL
Leukocytes, UA: NEGATIVE
Nitrite: NEGATIVE
PROTEIN: 30 mg/dL — AB
Specific Gravity, Urine: 1.012 (ref 1.005–1.030)
Squamous Epithelial / LPF: NONE SEEN
pH: 5 (ref 5.0–8.0)

## 2017-06-11 LAB — BASIC METABOLIC PANEL
Anion gap: 8 (ref 5–15)
BUN: 26 mg/dL — AB (ref 6–20)
CO2: 29 mmol/L (ref 22–32)
CREATININE: 2 mg/dL — AB (ref 0.61–1.24)
Calcium: 8.4 mg/dL — ABNORMAL LOW (ref 8.9–10.3)
Chloride: 101 mmol/L (ref 101–111)
GFR calc Af Amer: 33 mL/min — ABNORMAL LOW (ref >60)
GFR calc non Af Amer: 28 mL/min — ABNORMAL LOW (ref >60)
Glucose, Bld: 165 mg/dL — ABNORMAL HIGH (ref 65–99)
Potassium: 3 mmol/L — ABNORMAL LOW (ref 3.5–5.1)
SODIUM: 138 mmol/L (ref 135–145)

## 2017-06-11 MED ORDER — POTASSIUM CHLORIDE CRYS ER 20 MEQ PO TBCR
40.0000 meq | EXTENDED_RELEASE_TABLET | Freq: Once | ORAL | Status: AC
  Administered 2017-06-11: 40 meq via ORAL
  Filled 2017-06-11: qty 2

## 2017-06-11 NOTE — ED Triage Notes (Signed)
Pt from home with complaints of feeling like he has "grit" on his hands and in his hair for the past several months. Patient states at times he feels like he's inhaling it and it is making him cough. Pt attempts to wash hands and the feeling doesn't go away. No grit noted to skin or hair. Patient saw PCP for this and they told him that they did not see anything either. Patient just wants to get checked out. Pt is A&O x4, ambulatory with a cane with no known psych history.

## 2017-06-11 NOTE — ED Provider Notes (Signed)
[facility] DEPT Provider Note   CSN: [id number] Arrival date & time: 06/11/17  5621     History   Chief Complaint Chief Complaint  Patient presents with  . Tactile Disturbance    HPI Eric [name] is a 83 y.o. male.  Patient with past medical history of prior stroke presents to the ED with a chief complaint of right upper extremity and right face abnormal sensations.  He states that it feels like "sand is pouring out of my hands and face."  He acknowledges that he can't actually see any sand, but states that he has tried to wash his hands and soaked them in alcohol.  He states that sometimes it feels like he has the sand over his entire body.  He states that he has had a stroke in the past and has some residual weakness in his RUE from this.  He states that it is difficult for him to sign his name.    Upon chart review, patient has history of neuropathy, and has been seen before for paresthesias.   The history is provided by the patient. No language interpreter was used.    Past Medical History:  Diagnosis Date  . Acute endocarditis   . Bilateral hydrocele 3/11   [facility]  . Cerebrovascular disease, unspecified   . Cervicalgia   . Colon polyp   . COPD (chronic obstructive pulmonary disease) ([place]) 2017   dx during hospitalization  . Coronary atherosclerosis of unspecified type of vessel, native or graft    s/p CABG 2010  . Diabetes mellitus without complication ([place])   . Esophageal reflux   . Essential hypertension, benign   . Foraminal stenosis of cervical region    bilateral-C4-C5, C5-C6  . History of aortic stenosis    s/p valve replacment 2010  . Pure hypercholesterolemia   . Shingles 07/27/2012  . Stroke ([place])   . Type II or unspecified type diabetes mellitus with neurological manifestations, not stated as uncontrolled(250.60)   . 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 Acute diastolic (congestive) heart failure ([place]) 04/13/2017  . Lesion of right external ear 08/12/2016  . Vitamin B12 deficiency 06/02/2016  . Carotid stenosis 02/28/2016  . Anemia, iron deficiency 02/28/2016  . Insulin dependent diabetes mellitus ([place])   . Dilated cardiomyopathy ([place])   . Pulmonary hypertension ([place])   . Essential hypertension   . S/P CABG (coronary artery bypass graft) 12/25/2015  . S/P AVR (aortic valve replacement) 12/25/2015  . COPD (chronic obstructive pulmonary disease) ([place]) 10/21/2015  . Advanced care planning/counseling discussion 03/29/2015  . GERD (gastroesophageal reflux disease) 07/08/2014  . Paroxysmal atrial fibrillation ([place]) 07/02/2014  . Status post total replacement of left hip 06/30/2014  . Medicare annual wellness visit, subsequent 03/08/2012  . CKD (chronic kidney disease) stage 3, GFR 30-59 ml/min 09/24/2010  . BPH (benign prostatic hypertrophy) with urinary obstruction 01/10/2010  . History of CVA (cerebrovascular accident) 02/28/2009  . Insulin dependent diabetes mellitus with complications ([place]) 30/86/5784  . Diabetic neuropathy associated with type 2 diabetes mellitus ([place]) 12/17/2006  . HYPERCHOLESTEROLEMIA 12/17/2006    Past Surgical History:  Procedure Laterality Date  . AORTIC VALVE REPLACEMENT  12/2008   with pericardial tissue valve  . CARDIAC SURGERY    . Carotid US  10/2009   B ICA stenosis, stable disease, rec rpt 2 years  . CATARACT EXTRACTION  1990's   bilateral  . CHOLECYSTECTOMY  1995  . COLONOSCOPY  [place]  . CORONARY ARTERY BYPASS GRAFT  3/10   x3-using a left internal mammary artery to the left anterior descending coronary artery, saphenous vein graft to circumflex marginal branch, spahenous vein graft  to posterior descendingcoronary artery. Endoscopic saphenous vein harvest from bilateral thighs was done.   Marland Kitchen HYDROCELE EXCISION     bilateral  ([facility])  . L leg trauma  1957   truck over left leg  . PTCA  12/99   with stent  . sphincterectomy  09/20/03   for jaundice  . TEE WITHOUT CARDIOVERSION N/A 01/09/2016   Procedure: TRANSESOPHAGEAL ECHOCARDIOGRAM (TEE);  Surgeon: Dorothy Spark, MD;  Location: [facility];  Service: Cardiovascular;  Laterality: N/A;  . TEE WITHOUT CARDIOVERSION N/A 01/15/2016   Procedure: TRANSESOPHAGEAL ECHOCARDIOGRAM (TEE);  Surgeon: Larey Dresser, MD;  Location: [facility];  Service: Cardiovascular;  Laterality: N/A;  . TOTAL HIP ARTHROPLASTY Left 06/30/2014   Procedure: LEFT TOTAL HIP ARTHROPLASTY ANTERIOR APPROACH;  Surgeon: Mcarthur Rossetti, MD;  Location: [facility];  Service: Orthopedics;  Laterality: Left;       Home Medications    Prior to Admission medications   Medication Sig Start Date End Date Taking? Authorizing Provider  apixaban (ELIQUIS) 2.5 MG TABS tablet Take 1 tablet (2.5 mg total) by mouth 2 (two) times daily. 04/13/17   Lelon Perla, MD  atorvastatin (LIPITOR) 20 MG tablet TAKE 1 TABLET DAILY AT 6 P.M. 05/18/17   [name], MD  diltiazem (CARDIZEM) 30 MG tablet Take 1 tablet (30 mg total) by mouth 2 (two) times daily. 02/19/17   [name], MD  famotidine (PEPCID) 20 MG tablet Take 1 tablet (20 mg total) by mouth daily. 05/29/16   [name], MD  ferrous sulfate 325 (65 FE) MG tablet Take 1 tablet (325 mg total) by mouth daily with breakfast. 06/02/16   [name], MD  furosemide (LASIX) 20 MG tablet Take 1 tablet (20 mg total) by mouth daily. 04/01/17   Junius Creamer, NP  furosemide (LASIX) 20 MG tablet Take 2 tablets (40 mg total) by mouth daily. 05/21/17   Lelon Perla, MD  glipiZIDE (GLUCOTROL) 10 MG tablet TAKE 1 TABLET DAILY BEFORE BREAKFAST 05/07/17   [name], MD  insulin glargine (LANTUS) 100 UNIT/ML injection Inject 0.28 mLs (28 Units total) into the skin at bedtime. 02/19/17   [name], MD  Insulin  Syringe-Needle U-100 (B-D INS SYR ULTRAFINE 1CC/30G) 30G X 1/2" 1 ML MISC Use when taking insulin.Dx E11.40 08/01/16   Tonia Ghent, MD  nitroGLYCERIN (NITROSTAT) 0.4 MG SL tablet Place 1 tablet (0.4 mg total) under the tongue every 5 (five) minutes as needed. Call MD/NP if pain unrelieved. 04/13/17   Erlene Quan, PA-C  tetrahydrozoline-zinc (VISINE-AC) 0.05-0.25 % ophthalmic solution Place 2 drops into both eyes as needed.    [provider]    Family History Family History  Problem Relation Age of Onset  . Heart attack Father 11  . Breast cancer Mother 67  . Brain cancer Sister   . Prostate cancer Brother   . Diabetes Neg Hx   . Stroke Neg Hx     Social History Social History  Substance Use Topics  . Smoking status: Former Smoker    Packs/day: 1.00    Years: 50.00    Types: Cigarettes    Quit date: 10/20/1993  . Smokeless tobacco:  Never Used  . Alcohol use 0.6 oz/week    1 Cans of beer per week     Comment: occasional     Allergies   Cyanocobalamin [vitamin b12] and Ampicillin   Review of Systems Review of Systems  All other systems reviewed and are negative.    Physical Exam Updated Vital Signs BP 138/82 (BP Location: Right Arm)   Pulse 97   Temp 98 F (36.7 C) (Oral)   Resp 18   SpO2 97%   Physical Exam  Constitutional: He is oriented to person, place, and time. He appears well-developed and well-nourished.  HENT:  Head: Normocephalic and atraumatic.  Eyes: Pupils are equal, round, and reactive to light. Conjunctivae and EOM are normal. Right eye exhibits no discharge. Left eye exhibits no discharge. No scleral icterus.  Neck: Normal range of motion. Neck supple. No JVD present.  Cardiovascular: Normal rate, regular rhythm and normal heart sounds.  Exam reveals no gallop and no friction rub.   No murmur heard. Pulmonary/Chest: Effort normal and breath sounds normal. No respiratory distress. He has no wheezes. He has no rales. He exhibits no  tenderness.  Abdominal: Soft. He exhibits no distension and no mass. There is no tenderness. There is no rebound and no guarding.  Musculoskeletal: Normal range of motion. He exhibits no edema or tenderness.  Neurological: He is alert and oriented to person, place, and time.  Sensation and strength intact in upper and lower extremities  Skin: Skin is warm and dry.  Psychiatric: He has a normal mood and affect. His behavior is normal. Judgment and thought content normal.  Nursing note and vitals reviewed.    ED Treatments / Results  Labs (all labs ordered are listed, but only abnormal results are displayed) Labs Reviewed  CBC WITH DIFFERENTIAL/PLATELET  BASIC METABOLIC PANEL    EKG  EKG Interpretation None       Radiology No results found.  Procedures Procedures (including critical care time)  Medications Ordered in ED Medications - No data to display   Initial Impression / Assessment and Plan / ED Course  I have reviewed the triage vital signs and the nursing notes.  Pertinent labs & imaging results that were available during my care of the patient were reviewed by me and considered in my medical decision making (see chart for details).    Patient with sensation of sand or grit on fingers and hands.  CT is negative.  Hx of peripheral neuropathy.  It seems that this is what the patient is attempting to describe.  VSS.  He is alert and oriented.  He lives at home with his son.  Potassium repleted.  Patient seen by and discussed with Dr. Christy Gentles, who agrees that patient can follow-up with PCP.  I've instructed the patient to do so.  He understands agrees with the plan.  Final Clinical Impressions(s) / ED Diagnoses   Final diagnoses:  Other polyneuropathy    New Prescriptions New Prescriptions   No medications on file     Delaine Lame 06/11/17 1610    Ripley Fraise, MD 06/11/17 [phone]

## 2017-06-11 NOTE — ED Notes (Signed)
[facility] called for transport.  

## 2017-06-12 LAB — URINE CULTURE: CULTURE: NO GROWTH

## 2017-07-16 ENCOUNTER — Telehealth: Payer: Self-pay | Admitting: *Deleted

## 2017-07-16 NOTE — Telephone Encounter (Signed)
PA paperwork for eliquis faxed to the number provided

## 2017-07-16 NOTE — Telephone Encounter (Signed)
elquis approved until 07/16/18.

## 2017-07-27 ENCOUNTER — Telehealth: Payer: Self-pay

## 2017-07-27 NOTE — Telephone Encounter (Signed)
Pt and caregiver came by office after getting an automated call; not sure what call was about; pt is not out of any medications.Lattie Haw CMA has not tried to contact pt. Pt will cb or comeback if needed.

## 2017-08-01 ENCOUNTER — Emergency Department (HOSPITAL_COMMUNITY)
Admission: EM | Admit: 2017-08-01 | Discharge: 2017-08-01 | Disposition: A | Discharge status: Home / Self Care | Payer: Medicare Other | Attending: Emergency Medicine | Admitting: Emergency Medicine

## 2017-08-01 ENCOUNTER — Encounter (HOSPITAL_COMMUNITY): Payer: Self-pay | Admitting: Emergency Medicine

## 2017-08-01 DIAGNOSIS — R209 Unspecified disturbances of skin sensation: Secondary | ICD-10-CM | POA: Insufficient documentation

## 2017-08-01 DIAGNOSIS — Z5321 Procedure and treatment not carried out due to patient leaving prior to being seen by health care provider: Secondary | ICD-10-CM | POA: Diagnosis not present

## 2017-08-01 NOTE — ED Triage Notes (Signed)
Patient reports "i got grit coming our of me all over that's been year or more but just getting worse". Patient denies any pain at this time. States he is having a harder time closing right hand

## 2017-08-17 DIAGNOSIS — K635 Polyp of colon: Secondary | ICD-10-CM | POA: Insufficient documentation

## 2017-08-17 DIAGNOSIS — E119 Type 2 diabetes mellitus without complications: Secondary | ICD-10-CM | POA: Insufficient documentation

## 2017-08-17 DIAGNOSIS — Z8679 Personal history of other diseases of the circulatory system: Secondary | ICD-10-CM | POA: Insufficient documentation

## 2017-08-17 DIAGNOSIS — I639 Cerebral infarction, unspecified: Secondary | ICD-10-CM | POA: Insufficient documentation

## 2017-08-17 DIAGNOSIS — K219 Gastro-esophageal reflux disease without esophagitis: Secondary | ICD-10-CM | POA: Insufficient documentation

## 2017-08-17 DIAGNOSIS — I10 Essential (primary) hypertension: Secondary | ICD-10-CM | POA: Insufficient documentation

## 2017-08-17 DIAGNOSIS — M542 Cervicalgia: Secondary | ICD-10-CM | POA: Insufficient documentation

## 2017-08-17 DIAGNOSIS — I679 Cerebrovascular disease, unspecified: Secondary | ICD-10-CM | POA: Insufficient documentation

## 2017-08-17 DIAGNOSIS — I339 Acute and subacute endocarditis, unspecified: Secondary | ICD-10-CM | POA: Insufficient documentation

## 2017-08-17 DIAGNOSIS — M4802 Spinal stenosis, cervical region: Secondary | ICD-10-CM | POA: Insufficient documentation

## 2017-08-20 NOTE — Progress Notes (Signed)
HPI: FU AVR and coronary artery disease. His previous workup showed three-vessel coronary disease and severe aortic stenosis. The patient therefore on December 22 2008, underwent aortic valve replacement with #25-mm [facility] Ease pericardial tissue valve as well as coronary artery bypassing graft with a LIMA to the LAD, saphenous vein graft to the circ marginal and the saphenous vein graft to the PDA. Patient seen in the hospital March 2017 with new-onset atrial fibrillation, pneumonia and endocarditis. He received therapy for his endocarditis with antibiotics. Carotid Dopplers in March 2017 showed 1-39 bilateral stenosis. Transesophageal echocardiogram March 2017 showed normal LV systolic function, bioprosthetic aortic valve, moderate to severe mitral annular calcification with mild mitral stenosis, moderate mitral regurgitation and a 2 cm vegetation attached to the posterior mitral valve leaflet. Mild biatrial enlargement. Abdominal CT April 2017 showed abdominal aortic aneurysm 3.2 cm. Holter monitor September 2017 showed atrial fibrillation with PVCs or aberrantly conducted beats rate controlled. Echocardiogram July 2018 showed normal LV systolic function, bioprosthetic aortic valve with normal gradient and no AI, trivial mitral stenosis with mean gradient 5 mmHg and biatrial enlargement. Patient seen recently for acute on chronic diastolic congestive heart failure and diuretics increased. Abdominal ultrasound August 2018 showed 3.2 x 3.2 cm abdominal aortic aneurysm. Since I last saw him, he does have some dyspnea on exertion but has limited mobility. No orthopnea or PND. He does describe pedal edema. No exertional chest pain or syncope.  Current Outpatient Medications  Medication Sig Dispense Refill  . apixaban (ELIQUIS) 2.5 MG TABS tablet Take 1 tablet (2.5 mg total) by mouth 2 (two) times daily. 180 tablet 1  . atorvastatin (LIPITOR) 20 MG tablet TAKE 1 TABLET DAILY AT 6 P.M. 90 tablet 1  .  diltiazem (CARDIZEM) 30 MG tablet Take 1 tablet (30 mg total) by mouth 2 (two) times daily. 180 tablet 3  . famotidine (PEPCID) 20 MG tablet Take 1 tablet (20 mg total) by mouth daily. 90 tablet 3  . ferrous sulfate 325 (65 FE) MG tablet Take 1 tablet (325 mg total) by mouth daily with breakfast.    . furosemide (LASIX) 20 MG tablet Take 1 tablet (20 mg total) by mouth daily. 5 tablet 0  . furosemide (LASIX) 20 MG tablet Take 2 tablets (40 mg total) by mouth daily. 180 tablet 3  . glipiZIDE (GLUCOTROL) 10 MG tablet TAKE 1 TABLET DAILY BEFORE BREAKFAST 90 tablet 3  . insulin glargine (LANTUS) 100 UNIT/ML injection Inject 0.28 mLs (28 Units total) into the skin at bedtime. 30 mL 11  . Insulin Syringe-Needle U-100 (B-D INS SYR ULTRAFINE 1CC/30G) 30G X 1/2" 1 ML MISC Use when taking insulin.Dx E11.40 100 each 5  . nitroGLYCERIN (NITROSTAT) 0.4 MG SL tablet Place 1 tablet (0.4 mg total) under the tongue every 5 (five) minutes as needed. Call MD/NP if pain unrelieved. 25 tablet 3  . tetrahydrozoline-zinc (VISINE-AC) 0.05-0.25 % ophthalmic solution Place 2 drops into both eyes as needed.     No current facility-administered medications for this visit.      Past Medical History:  Diagnosis Date  . Acute endocarditis   . Bilateral hydrocele 3/11   [facility]  . Cerebrovascular disease, unspecified   . Cervicalgia   . Colon polyp   . COPD (chronic obstructive pulmonary disease) ([place]) 2017   dx during hospitalization  . Coronary atherosclerosis of unspecified type of vessel, native or graft    s/p CABG 2010  . Diabetes mellitus without complication ([place])   .  Esophageal reflux   . Essential hypertension, benign   . Foraminal stenosis of cervical region    bilateral-C4-C5, C5-C6  . History of aortic stenosis    s/p valve replacment 2010  . Pure hypercholesterolemia   . Shingles 07/27/2012  . Stroke ([place])   . Type II or unspecified type diabetes mellitus with neurological manifestations, not  stated as uncontrolled(250.60)   . 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 AORTIC VALVE REPLACEMENT  12/2008   with pericardial tissue valve  . CARDIAC SURGERY    . Carotid US  10/2009   B ICA stenosis, stable disease, rec rpt 2 years  . CATARACT EXTRACTION  1990's   bilateral  . CHOLECYSTECTOMY  1995  . COLONOSCOPY  [place]  . CORONARY ARTERY BYPASS GRAFT  3/10   x3-using a left internal mammary artery to the left anterior descending coronary artery, saphenous vein graft to circumflex marginal branch, spahenous vein graft  to posterior descendingcoronary artery. Endoscopic saphenous vein harvest from bilateral thighs was done.   Marland Kitchen HYDROCELE EXCISION     bilateral ([facility])  . L leg trauma  1957   truck over left leg  . PTCA  12/99   with stent  . sphincterectomy  09/20/03   for jaundice    Social History   Socioeconomic History  . Marital status: Widowed    Spouse name: Not on file  . Number of children: 3  . Years of education: Not on file  . Highest education level: Not on file  Social Needs  . Financial resource strain: Not on file  . Food insecurity - worry: Not on file  . Food insecurity - inability: Not on file  . Transportation needs - medical: Not on file  . Transportation needs - non-medical: Not on file  Occupational History  . Occupation: retired-truck Geophysicist/field seismologist  Tobacco Use  . Smoking status: Former Smoker    Packs/day: 1.00    Years: 50.00    Pack years: 50.00    Types: Cigarettes    Last attempt to quit: 10/20/1993    Years since quitting: 23.8  . Smokeless tobacco: Never Used  Substance and Sexual Activity  . Alcohol use: Yes    Alcohol/week: 0.6 oz    Types: 1 Cans of beer per week    Comment: occasional  . Drug use: No  . Sexual activity: No  Other Topics Concern  . 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    Family History  Problem Relation Age of Onset  . Heart attack Father 63  . Breast cancer Mother 63  . Brain cancer Sister   . Prostate cancer Brother   . Diabetes Neg Hx   . Stroke Neg Hx     ROS: peripheral neuropathy but no fevers or chills, productive cough, hemoptysis, dysphasia, odynophagia, melena, hematochezia, dysuria, hematuria, rash, seizure activity, orthopnea, PND, claudication. Remaining systems are negative.  Physical Exam: Well-developed elderly in no acute distress.  Skin is warm and dry.  HEENT is normal.  Neck is supple.  Chest is clear to auscultation with  normal expansion.  Cardiovascular exam is irregular Abdominal exam nontender or distended. No masses palpated. Extremities show 1+-2+ edema. neuro grossly intact  A/P  1 coronary artery disease-continue statin. No aspirin given need for anticoagulation.  2 atrial fibrillation-plan to continue cardizem for rate control. Continue apixaban.  3 status post aortic valve replacement-continue SBE prophylaxis. Patient does have a history of prosthetic valve endocarditis.  4 hyperlipidemia-continue statin.  5 hypertension-blood pressure is controlled. Continue present medications.  6 chronic diastolic congestive heart failure-patient is volume overloaded. Increase Lasix to 80 mg twice a day. Check potassium and renal function in one week.  7 abdominal aortic aneurysm-plan follow-up ultrasound August 2019.  8 chronic stage III kidney disease-follow renal function closely with diuresis.  Kirk Ruths, MD

## 2017-08-25 ENCOUNTER — Ambulatory Visit: Payer: Medicare Other | Admitting: Family Medicine

## 2017-08-31 ENCOUNTER — Ambulatory Visit (INDEPENDENT_AMBULATORY_CARE_PROVIDER_SITE_OTHER): Payer: Medicare Other | Admitting: Cardiology

## 2017-08-31 ENCOUNTER — Encounter: Payer: Self-pay | Admitting: Cardiology

## 2017-08-31 VITALS — BP 136/76 | HR 82 | Ht 66.5 in | Wt 230.0 lb

## 2017-08-31 DIAGNOSIS — E7800 Pure hypercholesterolemia, unspecified: Secondary | ICD-10-CM | POA: Diagnosis not present

## 2017-08-31 DIAGNOSIS — I10 Essential (primary) hypertension: Secondary | ICD-10-CM

## 2017-08-31 DIAGNOSIS — I482 Chronic atrial fibrillation: Secondary | ICD-10-CM

## 2017-08-31 DIAGNOSIS — I2510 Atherosclerotic heart disease of native coronary artery without angina pectoris: Secondary | ICD-10-CM | POA: Diagnosis not present

## 2017-08-31 DIAGNOSIS — I4821 Permanent atrial fibrillation: Secondary | ICD-10-CM

## 2017-08-31 DIAGNOSIS — I25810 Atherosclerosis of coronary artery bypass graft(s) without angina pectoris: Secondary | ICD-10-CM

## 2017-08-31 MED ORDER — FUROSEMIDE 40 MG PO TABS: 80.0000 mg | ORAL_TABLET | Freq: Two times a day (BID) | ORAL | 6 refills | Status: DC

## 2017-08-31 NOTE — Patient Instructions (Signed)
Low-Sodium Eating Plan Sodium, which is an element that makes up salt, helps you maintain a healthy balance of fluids in your body. Too much sodium can increase your blood pressure and cause fluid and waste to be held in your body. Your health care provider or dietitian may recommend following this plan if you have high blood pressure (hypertension), kidney disease, liver disease, or heart failure. Eating less sodium can help lower your blood pressure, reduce swelling, and protect your heart, liver, and kidneys. What are tips for following this plan? General guidelines  Most people on this plan should limit their sodium intake to 1,500-2,000 mg (milligrams) of sodium each day. Reading food labels  The Nutrition Facts label lists the amount of sodium in one serving of the food. If you eat more than one serving, you must multiply the listed amount of sodium by the number of servings.  Choose foods with less than 140 mg of sodium per serving.  Avoid foods with 300 mg of sodium or more per serving. Shopping  Look for lower-sodium products, often labeled as "low-sodium" or "no salt added."  Always check the sodium content even if foods are labeled as "unsalted" or "no salt added".  Buy fresh foods. ? Avoid canned foods and premade or frozen meals. ? Avoid canned, cured, or processed meats  Buy breads that have less than 80 mg of sodium per slice. Cooking  Eat more home-cooked food and less restaurant, buffet, and fast food.  Avoid adding salt when cooking. Use salt-free seasonings or herbs instead of table salt or sea salt. Check with your health care provider or pharmacist before using salt substitutes.  Cook with plant-based oils, such as canola, sunflower, or olive oil. Meal planning  When eating at a restaurant, ask that your food be prepared with less salt or no salt, if possible.  Avoid foods that contain MSG (monosodium glutamate). MSG is sometimes added to Chinese food,  bouillon, and some canned foods. What foods are recommended? The items listed may not be a complete list. Talk with your dietitian about what dietary choices are best for you. Grains Low-sodium cereals, including oats, puffed wheat and rice, and shredded wheat. Low-sodium crackers. Unsalted rice. Unsalted pasta. Low-sodium bread. Whole-grain breads and whole-grain pasta. Vegetables Fresh or frozen vegetables. "No salt added" canned vegetables. "No salt added" tomato sauce and paste. Low-sodium or reduced-sodium tomato and vegetable juice. Fruits Fresh, frozen, or canned fruit. Fruit juice. Meats and other protein foods Fresh or frozen (no salt added) meat, poultry, seafood, and fish. Low-sodium canned tuna and salmon. Unsalted nuts. Dried peas, beans, and lentils without added salt. Unsalted canned beans. Eggs. Unsalted nut butters. Dairy Milk. Soy milk. Cheese that is naturally low in sodium, such as ricotta cheese, fresh mozzarella, or Swiss cheese Low-sodium or reduced-sodium cheese. Cream cheese. Yogurt. Fats and oils Unsalted butter. Unsalted margarine with no trans fat. Vegetable oils such as canola or olive oils. Seasonings and other foods Fresh and dried herbs and spices. Salt-free seasonings. Low-sodium mustard and ketchup. Sodium-free salad dressing. Sodium-free light mayonnaise. Fresh or refrigerated horseradish. Lemon juice. Vinegar. Homemade, reduced-sodium, or low-sodium soups. Unsalted popcorn and pretzels. Low-salt or salt-free chips. What foods are not recommended? The items listed may not be a complete list. Talk with your dietitian about what dietary choices are best for you. Grains Instant hot cereals. Bread stuffing, pancake, and biscuit mixes. Croutons. Seasoned rice or pasta mixes. Noodle soup cups. Boxed or frozen macaroni and cheese. Regular salted crackers.   Self-rising flour. Vegetables Sauerkraut, pickled vegetables, and relishes. Olives. [place] fries. Onion rings.  Regular canned vegetables (not low-sodium or reduced-sodium). Regular canned tomato sauce and paste (not low-sodium or reduced-sodium). Regular tomato and vegetable juice (not low-sodium or reduced-sodium). Frozen vegetables in sauces. Meats and other protein foods Meat or fish that is salted, canned, smoked, spiced, or pickled. Bacon, ham, sausage, hotdogs, corned beef, chipped beef, packaged lunch meats, salt pork, jerky, pickled herring, anchovies, regular canned tuna, sardines, salted nuts. Dairy Processed cheese and cheese spreads. Cheese curds. Blue cheese. Feta cheese. String cheese. Regular cottage cheese. Buttermilk. Canned milk. Fats and oils Salted butter. Regular margarine. Ghee. Bacon fat. Seasonings and other foods Onion salt, garlic salt, seasoned salt, table salt, and sea salt. Canned and packaged gravies. Worcestershire sauce. Tartar sauce. Barbecue sauce. Teriyaki sauce. Soy sauce, including reduced-sodium. Steak sauce. Fish sauce. Oyster sauce. Cocktail sauce. Horseradish that you find on the shelf. Regular ketchup and mustard. Meat flavorings and tenderizers. Bouillon cubes. Hot sauce and Tabasco sauce. Premade or packaged marinades. Premade or packaged taco seasonings. Relishes. Regular salad dressings. Salsa. Potato and tortilla chips. Corn chips and puffs. Salted popcorn and pretzels. Canned or dried soups. Pizza. Frozen entrees and pot pies. Summary  Eating less sodium can help lower your blood pressure, reduce swelling, and protect your heart, liver, and kidneys.  Most people on this plan should limit their sodium intake to 1,500-2,000 mg (milligrams) of sodium each day.  Canned, boxed, and frozen foods are high in sodium. Restaurant foods, fast foods, and pizza are also very high in sodium. You also get sodium by adding salt to food.  Try to cook at home, eat more fresh fruits and vegetables, and eat less fast food, canned, processed, or prepared foods. This information is  not intended to replace advice given to you by your health care provider. Make sure you discuss any questions you have with your health care provider. Document Released: 03/28/2002 Document Revised: 09/29/2016 Document Reviewed: 09/29/2016 Elsevier Interactive Patient Education  2017 [employer]. Fluid Restriction Some health conditions may require you to restrict your fluid intake. This means that you need to limit the amount of fluid you drink each day. When you have a fluid restriction, you must carefully measure and keep track of the amount of fluid you drink. Your health care provider will identify the specific amount of fluid you are allowed each day. This amount may depend on several things, such as:  The amount of urine you produce in a day.  How much fluid you are keeping (retaining) in your body.  Your blood pressure.  What is my plan? Your health care provider recommends that you limit your fluid intake to __________ per day. What counts toward my fluid intake? Your fluid intake includes all liquids that you drink, as well as any foods that become liquid at room temperature. The following are examples of some fluids that you will have to restrict:  Tea, coffee, soda, lemonade, milk, water, juice, sport drinks, and nutritional supplement beverages.  Alcoholic beverages.  Cream.  Gravy.  Ice cubes.  Soup and broth.  The following are examples of foods that become liquid at room temperature. These foods will also count toward your fluid intake.  Ice cream and ice milk.  Frozen yogurt and sherbet.  Frozen ice pops.  Flavored gelatin.  How do I keep track of my fluid intake? Each morning, fill a jug with the amount of water that equals the amount of fluid  you are allowed for the day. You can use this water as a guideline for fluid allowance. Each time you take in any form of fluid, including ice cubes and foods that become liquid at room temperature, pour an equal  amount of water out of the container. This helps you to see how much fluid you are taking in. It also helps you to see how much of your fluid intake is left for the rest of the day. The following conversions may also be helpful in measuring your fluid intake:  1 cup equals 8 oz (240 mL).   cup equals 6 oz (180 mL).  ? cup equals 5? oz (160 mL).   cup equals 4 oz (120 mL).  ? cup equals 2? oz (80 mL).   cup equals 2 oz (60 mL).  2 Tbsp equals 1 oz (30 mL).  What home care instructions should I follow while restricting fluids?  Make sure that you stay within the recommended limit each day. Always measure and keep track of your fluids, as well as any foods that turn liquid at room temperature.  Use small cups and glasses and learn to sip fluids slowly.  Add a slice of fresh lemon or lemon juice to water or ice. This helps to satisfy your thirst.  Freeze fruit juice or water in an ice cube tray. Use this as part of your fluid allowance. These cubes are useful for quenching your thirst. Measure the amount of liquid in each ice cube prior to freezing so you can subtract this amount from your day's allowance when you consume each frozen cube.  Try frozen fruits between meals, such as grapes or strawberries.  Swallow your pills along with meals or soft foods, such as applesauce or mashed potatoes. This helps you to save your fluid allowance for something that you enjoy.  Weigh yourself every day. Keeping track of your daily weight can help you and your health care provider to notice as soon as possible if you are retaining too much fluid in your body. ? Weigh yourself every morning after you urinate but before you eat breakfast. ? Wear the same amount of clothing each time you weigh yourself. ? Write down your daily weight. Give this weight record to your health care provider. If your weight is going up, you may be retaining too much fluid. Every 2 cups (480 mL) of fluid retained in the  body becomes an extra 1 lb (0.45 kg) of body weight.  Avoid salty foods. These foods make you thirsty and make fluid control more difficult.  Brush your teeth often or rinse your mouth with mouthwash to help your dry mouth. Lemon wedges, hard sour candies, chewing gum, or breath spray may also help to moisten your mouth.  Keep the temperature in your home at a cooler level. Dry air increases thirst, so keep the air in your home as humid as possible.  Avoid being out in the hot sun, which can cause you to sweat and become thirsty. What are some signs that I may be taking in too much fluid? You may be taking in too much fluid if:  Your weight increases. Contact your health care provider if your weight increases 3 lb or more in a day or if it increases 5 lb or more in a week.  Your face, hands, legs, feet, and belly (abdomen) start to swell.  You have trouble breathing.  This information is not intended to replace advice given to you  by your health care provider. Make sure you discuss any questions you have with your health care provider. Document Released: 08/03/2007 Document Revised: 03/13/2016 Document Reviewed: 03/07/2014 Elsevier Interactive Patient Education  2018 [employer].  Medication Instructions:   INCREASE FUROSEMIDE TO 80 MG TWICE DAILY= 2 OF THE 40 MG TABLETS TWICE DAILY  Labwork:  Your physician recommends that you return for lab work in: [place]:  Your physician recommends that you schedule a follow-up appointment in: [place] recommends that you schedule a follow-up appointment in: Eric [name]

## 2017-09-02 ENCOUNTER — Ambulatory Visit: Payer: Medicare Other | Admitting: Family Medicine

## 2017-09-17 ENCOUNTER — Telehealth: Payer: Self-pay | Admitting: Cardiology

## 2017-09-17 NOTE — Telephone Encounter (Signed)
New message   Patient son requesting Lasix be sent to [place]* If patient is at the pharmacy, call can be transferred to refill team.   1. Which medications need to be refilled? (please list name of each medication and dose if known) furosemide (LASIX) 40 MG tablet  2. Which pharmacy/location (including street and city if local pharmacy) is medication to be sent to?  [place]  3. Do they need a 30 day or 90 day supply? [place]

## 2017-09-17 NOTE — Telephone Encounter (Signed)
Son calling wants to be sure pt medicine is called in today and transferred to [facility] on [address] if possible please.

## 2017-09-18 ENCOUNTER — Other Ambulatory Visit: Payer: Self-pay

## 2017-09-18 DIAGNOSIS — I2510 Atherosclerotic heart disease of native coronary artery without angina pectoris: Secondary | ICD-10-CM

## 2017-09-18 MED ORDER — FUROSEMIDE 40 MG PO TABS: 80.0000 mg | ORAL_TABLET | Freq: Two times a day (BID) | ORAL | 6 refills | Status: DC

## 2017-10-05 ENCOUNTER — Other Ambulatory Visit: Payer: Self-pay

## 2017-10-05 ENCOUNTER — Emergency Department (HOSPITAL_COMMUNITY)
Admission: EM | Admit: 2017-10-05 | Discharge: 2017-10-05 | Disposition: A | Discharge status: Home / Self Care | Payer: Medicare Other | Attending: Emergency Medicine | Admitting: Emergency Medicine

## 2017-10-05 ENCOUNTER — Emergency Department (HOSPITAL_COMMUNITY): Payer: Medicare Other

## 2017-10-05 ENCOUNTER — Encounter (HOSPITAL_COMMUNITY): Payer: Self-pay

## 2017-10-05 DIAGNOSIS — Z794 Long term (current) use of insulin: Secondary | ICD-10-CM | POA: Diagnosis not present

## 2017-10-05 DIAGNOSIS — Z8673 Personal history of transient ischemic attack (TIA), and cerebral infarction without residual deficits: Secondary | ICD-10-CM | POA: Diagnosis not present

## 2017-10-05 DIAGNOSIS — I130 Hypertensive heart and chronic kidney disease with heart failure and stage 1 through stage 4 chronic kidney disease, or unspecified chronic kidney disease: Secondary | ICD-10-CM | POA: Insufficient documentation

## 2017-10-05 DIAGNOSIS — R600 Localized edema: Secondary | ICD-10-CM | POA: Insufficient documentation

## 2017-10-05 DIAGNOSIS — I5031 Acute diastolic (congestive) heart failure: Secondary | ICD-10-CM | POA: Insufficient documentation

## 2017-10-05 DIAGNOSIS — R2240 Localized swelling, mass and lump, unspecified lower limb: Secondary | ICD-10-CM | POA: Diagnosis not present

## 2017-10-05 DIAGNOSIS — J449 Chronic obstructive pulmonary disease, unspecified: Secondary | ICD-10-CM | POA: Diagnosis not present

## 2017-10-05 DIAGNOSIS — E1122 Type 2 diabetes mellitus with diabetic chronic kidney disease: Secondary | ICD-10-CM | POA: Diagnosis not present

## 2017-10-05 DIAGNOSIS — Z952 Presence of prosthetic heart valve: Secondary | ICD-10-CM | POA: Diagnosis not present

## 2017-10-05 DIAGNOSIS — N183 Chronic kidney disease, stage 3 (moderate): Secondary | ICD-10-CM | POA: Diagnosis not present

## 2017-10-05 DIAGNOSIS — R609 Edema, unspecified: Secondary | ICD-10-CM

## 2017-10-05 DIAGNOSIS — Z951 Presence of aortocoronary bypass graft: Secondary | ICD-10-CM | POA: Diagnosis not present

## 2017-10-05 DIAGNOSIS — M7989 Other specified soft tissue disorders: Secondary | ICD-10-CM | POA: Diagnosis present

## 2017-10-05 DIAGNOSIS — Z87891 Personal history of nicotine dependence: Secondary | ICD-10-CM | POA: Diagnosis not present

## 2017-10-05 DIAGNOSIS — Z7901 Long term (current) use of anticoagulants: Secondary | ICD-10-CM | POA: Insufficient documentation

## 2017-10-05 DIAGNOSIS — Z79899 Other long term (current) drug therapy: Secondary | ICD-10-CM | POA: Diagnosis not present

## 2017-10-05 DIAGNOSIS — Z96642 Presence of left artificial hip joint: Secondary | ICD-10-CM | POA: Diagnosis not present

## 2017-10-05 DIAGNOSIS — R222 Localized swelling, mass and lump, trunk: Secondary | ICD-10-CM | POA: Diagnosis not present

## 2017-10-05 DIAGNOSIS — R9431 Abnormal electrocardiogram [ECG] [EKG]: Secondary | ICD-10-CM | POA: Diagnosis not present

## 2017-10-05 DIAGNOSIS — R030 Elevated blood-pressure reading, without diagnosis of hypertension: Secondary | ICD-10-CM | POA: Diagnosis not present

## 2017-10-05 LAB — COMPREHENSIVE METABOLIC PANEL
ALBUMIN: 4 g/dL (ref 3.5–5.0)
ALK PHOS: 77 U/L (ref 38–126)
ALT: 10 U/L — AB (ref 17–63)
AST: 20 U/L (ref 15–41)
Anion gap: 8 (ref 5–15)
BILIRUBIN TOTAL: 1.2 mg/dL (ref 0.3–1.2)
BUN: 19 mg/dL (ref 6–20)
CO2: 27 mmol/L (ref 22–32)
CREATININE: 1.71 mg/dL — AB (ref 0.61–1.24)
Calcium: 8.7 mg/dL — ABNORMAL LOW (ref 8.9–10.3)
Chloride: 102 mmol/L (ref 101–111)
GFR calc Af Amer: 39 mL/min — ABNORMAL LOW (ref >60)
GFR, EST NON AFRICAN AMERICAN: 34 mL/min — AB (ref >60)
GLUCOSE: 90 mg/dL (ref 65–99)
Potassium: 3.5 mmol/L (ref 3.5–5.1)
Sodium: 137 mmol/L (ref 135–145)
TOTAL PROTEIN: 7.5 g/dL (ref 6.5–8.1)

## 2017-10-05 LAB — CBC WITH DIFFERENTIAL/PLATELET
BASOS ABS: 0 10*3/uL (ref 0.0–0.1)
BASOS PCT: 0 %
EOS PCT: 3 %
Eosinophils Absolute: 0.2 10*3/uL (ref 0.0–0.7)
HEMATOCRIT: 36.8 % — AB (ref 39.0–52.0)
Hemoglobin: 11.8 g/dL — ABNORMAL LOW (ref 13.0–17.0)
Lymphocytes Relative: 20 %
Lymphs Abs: 1 10*3/uL (ref 0.7–4.0)
MCH: 30.6 pg (ref 26.0–34.0)
MCHC: 32.1 g/dL (ref 30.0–36.0)
MCV: 95.6 fL (ref 78.0–100.0)
MONO ABS: 0.3 10*3/uL (ref 0.1–1.0)
MONOS PCT: 6 %
NEUTROS ABS: 3.7 10*3/uL (ref 1.7–7.7)
Neutrophils Relative %: 71 %
PLATELETS: 160 10*3/uL (ref 150–400)
RBC: 3.85 MIL/uL — ABNORMAL LOW (ref 4.22–5.81)
RDW: 14.1 % (ref 11.5–15.5)
WBC: 5.2 10*3/uL (ref 4.0–10.5)

## 2017-10-05 LAB — I-STAT TROPONIN, ED: Troponin i, poc: 0.01 ng/mL (ref 0.00–0.08)

## 2017-10-05 LAB — BRAIN NATRIURETIC PEPTIDE: B NATRIURETIC PEPTIDE 5: 101 pg/mL — AB (ref 0.0–100.0)

## 2017-10-05 MED ORDER — FUROSEMIDE 10 MG/ML IJ SOLN
40.0000 mg | Freq: Once | INTRAMUSCULAR | Status: AC
  Administered 2017-10-05: 40 mg via INTRAVENOUS
  Filled 2017-10-05: qty 4

## 2017-10-05 NOTE — ED Notes (Signed)
Patient transported to X-ray 

## 2017-10-05 NOTE — ED Triage Notes (Addendum)
BIB EMS from Home w/ c/o bilateral leg swelling. Pt reports intermittent leg pain x3 days. Pt denies n/v, sob, and cp.    EMS Vitals  BP 168/84 P 92 RR 18 SPO2  [id number]
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
## 2017-10-05 NOTE — ED Notes (Signed)
[employer] notified pt requesting self-pay service.

## 2017-10-05 NOTE — Discharge Instructions (Signed)
Increase your dose of Lasix to 120 mg twice daily for the next 5 days.  Reduce your fluid intake by 16 ounces per day for the next 5 days.  Follow-up with your primary doctor the end of this week for a recheck, and return to the ER if you develop difficulty breathing, worsening swelling, fever, redness, or other new and concerning symptoms.

## 2017-10-07 ENCOUNTER — Telehealth: Payer: Self-pay | Admitting: Family Medicine

## 2017-10-07 NOTE — Telephone Encounter (Signed)
Pt calling stating he needs some help. Pt states he has been in and out of the hospital and "they just run test and send me back home", and feels like no one is listening to him. Pt states he has been up all night and can not sleep. Pt unable to voice what is going on with him, he just states he needs to talk to someone because he feels like he is going to blow up.Offered to make pt appt to address concerns but he states he does not transportation to get to the appt. Pt placed son on the phone to talk for him. Pt's son given [facility] number so that the pt could talk to someone about current issues.

## 2017-10-08 ENCOUNTER — Other Ambulatory Visit: Payer: Self-pay

## 2017-10-08 ENCOUNTER — Emergency Department (HOSPITAL_COMMUNITY): Payer: Medicare Other

## 2017-10-08 ENCOUNTER — Ambulatory Visit: Payer: Self-pay | Admitting: *Deleted

## 2017-10-08 ENCOUNTER — Telehealth: Payer: Self-pay | Admitting: Family Medicine

## 2017-10-08 ENCOUNTER — Emergency Department (HOSPITAL_COMMUNITY)
Admission: EM | Admit: 2017-10-08 | Discharge: 2017-10-10 | Disposition: A | Discharge status: Other Acute Inpatient Hospital | Payer: Medicare Other | Attending: Emergency Medicine | Admitting: Emergency Medicine

## 2017-10-08 DIAGNOSIS — Z9861 Coronary angioplasty status: Secondary | ICD-10-CM | POA: Insufficient documentation

## 2017-10-08 DIAGNOSIS — F3289 Other specified depressive episodes: Secondary | ICD-10-CM

## 2017-10-08 DIAGNOSIS — R404 Transient alteration of awareness: Secondary | ICD-10-CM | POA: Diagnosis not present

## 2017-10-08 DIAGNOSIS — Z7901 Long term (current) use of anticoagulants: Secondary | ICD-10-CM | POA: Diagnosis not present

## 2017-10-08 DIAGNOSIS — F329 Major depressive disorder, single episode, unspecified: Secondary | ICD-10-CM | POA: Diagnosis not present

## 2017-10-08 DIAGNOSIS — N183 Chronic kidney disease, stage 3 (moderate): Secondary | ICD-10-CM | POA: Insufficient documentation

## 2017-10-08 DIAGNOSIS — F322 Major depressive disorder, single episode, severe without psychotic features: Secondary | ICD-10-CM | POA: Diagnosis not present

## 2017-10-08 DIAGNOSIS — R531 Weakness: Secondary | ICD-10-CM

## 2017-10-08 DIAGNOSIS — J449 Chronic obstructive pulmonary disease, unspecified: Secondary | ICD-10-CM | POA: Insufficient documentation

## 2017-10-08 DIAGNOSIS — Z049 Encounter for examination and observation for unspecified reason: Secondary | ICD-10-CM

## 2017-10-08 DIAGNOSIS — E1140 Type 2 diabetes mellitus with diabetic neuropathy, unspecified: Secondary | ICD-10-CM | POA: Diagnosis not present

## 2017-10-08 DIAGNOSIS — Z794 Long term (current) use of insulin: Secondary | ICD-10-CM | POA: Diagnosis not present

## 2017-10-08 DIAGNOSIS — M6281 Muscle weakness (generalized): Secondary | ICD-10-CM | POA: Diagnosis present

## 2017-10-08 DIAGNOSIS — J810 Acute pulmonary edema: Secondary | ICD-10-CM | POA: Diagnosis not present

## 2017-10-08 DIAGNOSIS — I129 Hypertensive chronic kidney disease with stage 1 through stage 4 chronic kidney disease, or unspecified chronic kidney disease: Secondary | ICD-10-CM | POA: Insufficient documentation

## 2017-10-08 DIAGNOSIS — Z87891 Personal history of nicotine dependence: Secondary | ICD-10-CM | POA: Insufficient documentation

## 2017-10-08 DIAGNOSIS — R45851 Suicidal ideations: Secondary | ICD-10-CM | POA: Diagnosis not present

## 2017-10-08 DIAGNOSIS — R4587 Impulsiveness: Secondary | ICD-10-CM | POA: Diagnosis not present

## 2017-10-08 LAB — COMPREHENSIVE METABOLIC PANEL
ALK PHOS: 66 U/L (ref 38–126)
ALT: 11 U/L — AB (ref 17–63)
AST: 18 U/L (ref 15–41)
Albumin: 3.5 g/dL (ref 3.5–5.0)
Anion gap: 7 (ref 5–15)
BILIRUBIN TOTAL: 1.6 mg/dL — AB (ref 0.3–1.2)
BUN: 18 mg/dL (ref 6–20)
CALCIUM: 8.5 mg/dL — AB (ref 8.9–10.3)
CO2: 27 mmol/L (ref 22–32)
CREATININE: 1.81 mg/dL — AB (ref 0.61–1.24)
Chloride: 102 mmol/L (ref 101–111)
GFR, EST AFRICAN AMERICAN: 37 mL/min — AB (ref >60)
GFR, EST NON AFRICAN AMERICAN: 32 mL/min — AB (ref >60)
Glucose, Bld: 110 mg/dL — ABNORMAL HIGH (ref 65–99)
Potassium: 3.3 mmol/L — ABNORMAL LOW (ref 3.5–5.1)
Sodium: 136 mmol/L (ref 135–145)
TOTAL PROTEIN: 6.3 g/dL — AB (ref 6.5–8.1)

## 2017-10-08 LAB — URINALYSIS, ROUTINE W REFLEX MICROSCOPIC
Bacteria, UA: NONE SEEN
Bilirubin Urine: NEGATIVE
GLUCOSE, UA: NEGATIVE mg/dL
Hgb urine dipstick: NEGATIVE
Ketones, ur: NEGATIVE mg/dL
Leukocytes, UA: NEGATIVE
Nitrite: NEGATIVE
PH: 7 (ref 5.0–8.0)
PROTEIN: 30 mg/dL — AB
SQUAMOUS EPITHELIAL / LPF: NONE SEEN
Specific Gravity, Urine: 1.012 (ref 1.005–1.030)

## 2017-10-08 LAB — CBG MONITORING, ED
GLUCOSE-CAPILLARY: 103 mg/dL — AB (ref 65–99)
GLUCOSE-CAPILLARY: 194 mg/dL — AB (ref 65–99)

## 2017-10-08 LAB — CBC
HCT: 33.9 % — ABNORMAL LOW (ref 39.0–52.0)
HEMOGLOBIN: 10.9 g/dL — AB (ref 13.0–17.0)
MCH: 30.4 pg (ref 26.0–34.0)
MCHC: 32.2 g/dL (ref 30.0–36.0)
MCV: 94.7 fL (ref 78.0–100.0)
Platelets: 140 10*3/uL — ABNORMAL LOW (ref 150–400)
RBC: 3.58 MIL/uL — AB (ref 4.22–5.81)
RDW: 13.8 % (ref 11.5–15.5)
WBC: 4.9 10*3/uL (ref 4.0–10.5)

## 2017-10-08 LAB — I-STAT TROPONIN, ED: TROPONIN I, POC: 0.03 ng/mL (ref 0.00–0.08)

## 2017-10-08 MED ORDER — ATORVASTATIN CALCIUM 20 MG PO TABS
20.0000 mg | ORAL_TABLET | Freq: Every day | ORAL | Status: DC
  Administered 2017-10-09: 20 mg via ORAL
  Filled 2017-10-08 (×2): qty 1

## 2017-10-08 MED ORDER — ACETAMINOPHEN 500 MG PO TABS
500.0000 mg | ORAL_TABLET | Freq: Four times a day (QID) | ORAL | Status: DC | PRN
  Administered 2017-10-08 – 2017-10-09 (×2): 500 mg via ORAL
  Filled 2017-10-08 (×2): qty 1

## 2017-10-08 MED ORDER — FUROSEMIDE 40 MG PO TABS
80.0000 mg | ORAL_TABLET | Freq: Two times a day (BID) | ORAL | Status: DC
  Administered 2017-10-08 – 2017-10-10 (×4): 80 mg via ORAL
  Filled 2017-10-08 (×4): qty 2

## 2017-10-08 MED ORDER — INSULIN GLARGINE 100 UNIT/ML ~~LOC~~ SOLN
28.0000 [IU] | Freq: Every day | SUBCUTANEOUS | Status: DC
  Administered 2017-10-08 – 2017-10-09 (×2): 28 [IU] via SUBCUTANEOUS
  Filled 2017-10-08 (×3): qty 0.28

## 2017-10-08 MED ORDER — FAMOTIDINE 20 MG PO TABS
20.0000 mg | ORAL_TABLET | Freq: Every day | ORAL | Status: DC
  Administered 2017-10-08 – 2017-10-10 (×3): 20 mg via ORAL
  Filled 2017-10-08 (×3): qty 1

## 2017-10-08 MED ORDER — SODIUM CHLORIDE 0.9 % IV SOLN
INTRAVENOUS | Status: DC
  Administered 2017-10-08: 20 mL via INTRAVENOUS

## 2017-10-08 MED ORDER — GLIPIZIDE 10 MG PO TABS
10.0000 mg | ORAL_TABLET | Freq: Every day | ORAL | Status: DC
  Administered 2017-10-09 – 2017-10-10 (×2): 10 mg via ORAL
  Filled 2017-10-08 (×2): qty 1

## 2017-10-08 MED ORDER — APIXABAN 2.5 MG PO TABS
2.5000 mg | ORAL_TABLET | Freq: Two times a day (BID) | ORAL | Status: DC
  Administered 2017-10-08 – 2017-10-10 (×4): 2.5 mg via ORAL
  Filled 2017-10-08 (×5): qty 1

## 2017-10-08 MED ORDER — CITALOPRAM HYDROBROMIDE 10 MG PO TABS
10.0000 mg | ORAL_TABLET | Freq: Every day | ORAL | Status: DC
  Administered 2017-10-08 – 2017-10-10 (×3): 10 mg via ORAL
  Filled 2017-10-08 (×3): qty 1

## 2017-10-08 NOTE — Progress Notes (Signed)
TTS faxed a copy of the pt's EKG to [facility] for review.   Lind Covert, MSW, LCSW Therapeutic Triage Specialist  [phone]

## 2017-10-08 NOTE — ED Notes (Signed)
Bed: [facility] Expected date: 10/08/17 Expected time:  Means of arrival:  Comments: EMS-dizziness

## 2017-10-08 NOTE — ED Notes (Addendum)
URINE CULTURE WAS SENT DOWN WITH A URINE SPECIMEN.  NURSE AWARE.

## 2017-10-08 NOTE — ED Notes (Signed)
Bed: [facility] Expected date:  Expected time:  Means of arrival:  Comments: Hold for hall c

## 2017-10-08 NOTE — Progress Notes (Signed)
Pt chart reviewed.  Pt meets inpatient criteria per Waylan Boga, NP.  Patient referrals sent to the following hospitals:  [facility] and [facility].  Areatha Keas. Judi Cong, MSW, [place] Disposition Clinical Social Work [phone] (cell) [phone] (office)

## 2017-10-08 NOTE — ED Notes (Signed)
Pt. Aware of urine specimen. Will collect urine when pt. voids. Nurses aware.

## 2017-10-08 NOTE — Care Management Note (Signed)
Case Management Note  Patient Details  Name: Eric [name] MRN: [id number] Date of Birth: 1926/11/18  Subjective/Objective:                  83 y.o. male c/o generalized weakness for the past few days  Action/Plan: CM consulted for HHS.  Spoke with pt who is agreeable to having help.  He admitted he cannot take care of himself anymore and that he's the only one in the house that drives but shouldn't be doing that anymore either.  He states his son Eric [name] who lives with him can help give him his medications and help dress him but that is it.  Pt was tearful during conversation and states "I'm at the end of the road and I'm ready to go and have been for a few years now."  Pt appears to be struggling with pain "all over especially my hand" and possibly depression.  Pt reports he has used [facility] in the past and would like to use them again but seems despairing that HHS will help him improve for feel any better.  CM contacted Tim with [facility] who accepted the pt.  CM received a call from pt's daughter voicing her concerns for the pt's welfare at home and that she had spoken with the pt's doctor about facility placement and also having his DL revoked.  CM discussed with her that POA is not able to force the pt to agree to help, that his doctor and the court system would have to make that decision if he was not competent.  CM and daughter discussed that pt was A&Ox4 and capable of making his own decisions at this time.  Her concern was that he has not been open to additional help up until the last day or so.  Discussed [facility], her preference as well without knowledge that pt had chosen it too, and the process to additional help in the home and/or facility placement, both with assistance from pt's PCP and [facility] team.  Pt's daughter understood the process better.  She advised that her brother Eric [name] would be coming to the ED shortly while she was at work.  CM gained permission from pt to  speak in greater detail about pt's condition/choices with daughter and advised pt that his son was coming to the ED to be with him.  CM advised her that the pt was agreeable to [facility] and [facility].  CM did not discuss with pt going to a facility in the near future at this time due to current emotional condition.  Updated Dr. Ashok Cordia.  No further CM needs noted at this time.  Expected Discharge Date:   10/08/2017               Expected Discharge Plan:  [place]  Discharge planning Services  CM Consult  Post Acute Care Choice:  Home Health Choice offered to:  Patient, Adult Children  HH Arranged:  RN, PT, OT, Nurse's Aide, Social Work [employer] Agency:  [facility] [employer]  Status of Service:  Completed, signed off  Eric [name], Benjaman Lobe, RN 10/08/2017, 11:08 AM
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
## 2017-10-08 NOTE — Telephone Encounter (Signed)
Pt's daughter Eric [name]' called stating family needs help with home care for father. Father is presently at [facility].  Advised to speak with Case Manager at [place].

## 2017-10-08 NOTE — ED Notes (Signed)
Patient ambulatory to restroom with use of cane plus one assist. Patient also given a snack and oral fluids. Plan of care reviewed with patient, no other acute needs identified.

## 2017-10-08 NOTE — ED Notes (Signed)
Pt. Had a condom cath put on due to pt. Staying dry while laying in the bed. Nurse aware.

## 2017-10-08 NOTE — ED Notes (Signed)
Pt stated "The doc wanted me to stay the night.  I live @ home with my son.  I just damn old.  I'm at the end of the road."    Pt denies SI/HI.

## 2017-10-08 NOTE — ED Provider Notes (Signed)
83 yo M with a cc of weakness.  The patient was cleared medically but the patient was concerning enough that he was severely depressed and wished to speak with the mental health professionals.  TTS consult was ordered.  Patient in signout from Dr. Ashok [name].   Patient meets inpatient criteria.  Will be admitted.  The patients results and plan were reviewed and discussed.   Any x-rays performed were independently reviewed by myself.   Differential diagnosis were considered with the presenting HPI.  Medications  0.9 %  sodium chloride infusion (20 mLs Intravenous New Bag/Given 10/08/17 0952)  apixaban (ELIQUIS) tablet 2.5 mg (not administered)  atorvastatin (LIPITOR) tablet 20 mg (not administered)  famotidine (PEPCID) tablet 20 mg (not administered)  furosemide (LASIX) tablet 80 mg (not administered)  glipiZIDE (GLUCOTROL) tablet 10 mg (not administered)  insulin glargine (LANTUS) injection 28 Units (not administered)  citalopram (CELEXA) tablet 10 mg (not administered)    Vitals:   10/08/17 0911 10/08/17 1116 10/08/17 1402 10/08/17 1613  BP: 129/70 125/65 (!) 148/82 (!) 162/99  Pulse: 93 90 90 88  Resp: 18 18 20 18   Temp: 98 F (36.7 C)     TempSrc: Oral     SpO2: 98% 98% 97% 98%    Final diagnoses:  Generalized weakness  Other depression    Admission/ observation were discussed with the admitting physician, patient and/or family and they are comfortable with the plan.     Eric Etienne, DO 10/08/17 [phone]

## 2017-10-08 NOTE — ED Notes (Signed)
Patient transported to X-ray 
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
## 2017-10-08 NOTE — ED Triage Notes (Signed)
Transported by [facility] from home, pt reports dizziness and weakness since yesterday. No other physical complaints. Alert and oriented x 4. Hx of A-fib, EMS reported a rate between 80-100 en route.

## 2017-10-08 NOTE — ED Notes (Signed)
Report given to Regino Ramirez, Therapist, sports. Care transferred at this time.

## 2017-10-09 ENCOUNTER — Emergency Department (HOSPITAL_COMMUNITY): Payer: Medicare Other

## 2017-10-09 DIAGNOSIS — F322 Major depressive disorder, single episode, severe without psychotic features: Secondary | ICD-10-CM | POA: Diagnosis not present

## 2017-10-09 DIAGNOSIS — Z87891 Personal history of nicotine dependence: Secondary | ICD-10-CM

## 2017-10-09 DIAGNOSIS — R4587 Impulsiveness: Secondary | ICD-10-CM | POA: Diagnosis not present

## 2017-10-09 DIAGNOSIS — R45851 Suicidal ideations: Secondary | ICD-10-CM | POA: Diagnosis not present

## 2017-10-09 LAB — CBG MONITORING, ED
GLUCOSE-CAPILLARY: 119 mg/dL — AB (ref 65–99)
GLUCOSE-CAPILLARY: 145 mg/dL — AB (ref 65–99)
GLUCOSE-CAPILLARY: 226 mg/dL — AB (ref 65–99)
Glucose-Capillary: 176 mg/dL — ABNORMAL HIGH (ref 65–99)
Glucose-Capillary: 220 mg/dL — ABNORMAL HIGH (ref 65–99)

## 2017-10-09 LAB — BASIC METABOLIC PANEL
Anion gap: 10 (ref 5–15)
BUN: 17 mg/dL (ref 6–20)
CALCIUM: 8.6 mg/dL — AB (ref 8.9–10.3)
CO2: 26 mmol/L (ref 22–32)
CREATININE: 1.82 mg/dL — AB (ref 0.61–1.24)
Chloride: 98 mmol/L — ABNORMAL LOW (ref 101–111)
GFR calc Af Amer: 37 mL/min — ABNORMAL LOW (ref >60)
GFR, EST NON AFRICAN AMERICAN: 32 mL/min — AB (ref >60)
Glucose, Bld: 146 mg/dL — ABNORMAL HIGH (ref 65–99)
Potassium: 3.9 mmol/L (ref 3.5–5.1)
SODIUM: 134 mmol/L — AB (ref 135–145)

## 2017-10-09 LAB — BLOOD GAS, ARTERIAL
Acid-Base Excess: 2.5 mmol/L — ABNORMAL HIGH (ref 0.0–2.0)
Bicarbonate: 26.3 mmol/L (ref 20.0–28.0)
DRAWN BY: 257881
O2 SAT: 94.7 %
PATIENT TEMPERATURE: 98.6
PH ART: 7.436 (ref 7.350–7.450)
pCO2 arterial: 39.8 mmHg (ref 32.0–48.0)
pO2, Arterial: 73.8 mmHg — ABNORMAL LOW (ref 83.0–108.0)

## 2017-10-09 LAB — TROPONIN I: TROPONIN I: 0.03 ng/mL — AB (ref <0.03)

## 2017-10-09 MED ORDER — ACETAMINOPHEN 500 MG PO TABS
500.0000 mg | ORAL_TABLET | ORAL | Status: DC | PRN
  Administered 2017-10-10 (×2): 500 mg via ORAL
  Filled 2017-10-09 (×2): qty 1

## 2017-10-09 NOTE — ED Notes (Signed)
Received call from [place], [place] stating room now ready.  Informed Pamala Hurry, d/t IVC & need for [facility] transport, pt will not arrive until 10/10/17.
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
## 2017-10-09 NOTE — ED Notes (Signed)
Family at bedside. 

## 2017-10-09 NOTE — BHH Counselor (Signed)
10/09/2017: Per RN pt has been accepted to [facility], assigned to room/bed: 9443. Nursing report to [phone]. Pt can come tomorrow, 10/10/2017.    Vertell Novak, MS, [facility], [facility] Triage Specialist [phone]

## 2017-10-09 NOTE — BH Assessment (Signed)
[facility] Assessment Progress Note  Per Buford Dresser, DO, this pt requires psychiatric hospitalization at this time.  She also finds that pt meets criteria for IVC, which she as initiated.  IVC documents have been faxed to [facility], and at 10:56 Vevelyn Royals confirms receipt.  As of this writing, service of Findings and Custody Order is pending.  At 12:39 Carlyon Shadow calls from [facility] to report that pt has been pre-accepted to their facility by Dr Michae Kava in anticipation of a bed that will become available later today.  Darlene will call when the bed is open, and at that time she will provide the phone number for report.  Waylan Boga, DNP concurs with this decision.  Pt's nurse, Lala Lund, has been notified.  Pt is to be transported via [facility] when the time comes.  Jalene Mullet, [place] Coordinator [phone]

## 2017-10-09 NOTE — ED Notes (Signed)
Placed phone call to [facility]: [phone] Pamala Hurry) states they will call when bed is clean.

## 2017-10-09 NOTE — ED Notes (Signed)
[facility], [phone].  Transferred to [facility], was provided room assignment 9443, call report to [phone].

## 2017-10-09 NOTE — ED Notes (Signed)
Spoke with daughter, Sudie Bailey, [phone]. Will call daughter to report when patient leaves for [facility] and room assignment.

## 2017-10-09 NOTE — ED Notes (Signed)
Dialed [phone], [facility], [employer] requesting return call for further information r/t pt transport to [facility].

## 2017-10-09 NOTE — ED Notes (Addendum)
Pt stating "I take tylenol 500 mg every 4 hours.  My belly's all bloated.  I need a BM."  Informed will speak to MD.

## 2017-10-10 ENCOUNTER — Other Ambulatory Visit: Payer: Self-pay | Admitting: Cardiology

## 2017-10-10 DIAGNOSIS — I129 Hypertensive chronic kidney disease with stage 1 through stage 4 chronic kidney disease, or unspecified chronic kidney disease: Secondary | ICD-10-CM | POA: Diagnosis present

## 2017-10-10 DIAGNOSIS — E1140 Type 2 diabetes mellitus with diabetic neuropathy, unspecified: Secondary | ICD-10-CM | POA: Diagnosis present

## 2017-10-10 DIAGNOSIS — F0151 Vascular dementia with behavioral disturbance: Secondary | ICD-10-CM | POA: Diagnosis present

## 2017-10-10 DIAGNOSIS — Z87438 Personal history of other diseases of male genital organs: Secondary | ICD-10-CM | POA: Diagnosis not present

## 2017-10-10 DIAGNOSIS — Z951 Presence of aortocoronary bypass graft: Secondary | ICD-10-CM | POA: Diagnosis not present

## 2017-10-10 DIAGNOSIS — J449 Chronic obstructive pulmonary disease, unspecified: Secondary | ICD-10-CM | POA: Diagnosis not present

## 2017-10-10 DIAGNOSIS — E538 Deficiency of other specified B group vitamins: Secondary | ICD-10-CM | POA: Diagnosis present

## 2017-10-10 DIAGNOSIS — I10 Essential (primary) hypertension: Secondary | ICD-10-CM | POA: Diagnosis not present

## 2017-10-10 DIAGNOSIS — I480 Paroxysmal atrial fibrillation: Secondary | ICD-10-CM | POA: Diagnosis present

## 2017-10-10 DIAGNOSIS — R45851 Suicidal ideations: Secondary | ICD-10-CM | POA: Diagnosis present

## 2017-10-10 DIAGNOSIS — Z8679 Personal history of other diseases of the circulatory system: Secondary | ICD-10-CM | POA: Diagnosis not present

## 2017-10-10 DIAGNOSIS — Z794 Long term (current) use of insulin: Secondary | ICD-10-CM | POA: Diagnosis not present

## 2017-10-10 DIAGNOSIS — Z96642 Presence of left artificial hip joint: Secondary | ICD-10-CM | POA: Diagnosis present

## 2017-10-10 DIAGNOSIS — Z880 Allergy status to penicillin: Secondary | ICD-10-CM | POA: Diagnosis not present

## 2017-10-10 DIAGNOSIS — Z888 Allergy status to other drugs, medicaments and biological substances status: Secondary | ICD-10-CM | POA: Diagnosis not present

## 2017-10-10 DIAGNOSIS — G319 Degenerative disease of nervous system, unspecified: Secondary | ICD-10-CM | POA: Diagnosis not present

## 2017-10-10 DIAGNOSIS — R05 Cough: Secondary | ICD-10-CM | POA: Diagnosis not present

## 2017-10-10 DIAGNOSIS — I2510 Atherosclerotic heart disease of native coronary artery without angina pectoris: Secondary | ICD-10-CM | POA: Diagnosis present

## 2017-10-10 DIAGNOSIS — E1142 Type 2 diabetes mellitus with diabetic polyneuropathy: Secondary | ICD-10-CM | POA: Diagnosis not present

## 2017-10-10 DIAGNOSIS — Z79899 Other long term (current) drug therapy: Secondary | ICD-10-CM | POA: Diagnosis not present

## 2017-10-10 DIAGNOSIS — F0391 Unspecified dementia with behavioral disturbance: Secondary | ICD-10-CM | POA: Diagnosis not present

## 2017-10-10 DIAGNOSIS — Z8673 Personal history of transient ischemic attack (TIA), and cerebral infarction without residual deficits: Secondary | ICD-10-CM | POA: Diagnosis not present

## 2017-10-10 DIAGNOSIS — Z7901 Long term (current) use of anticoagulants: Secondary | ICD-10-CM | POA: Diagnosis not present

## 2017-10-10 DIAGNOSIS — Z87891 Personal history of nicotine dependence: Secondary | ICD-10-CM | POA: Diagnosis not present

## 2017-10-10 DIAGNOSIS — E7800 Pure hypercholesterolemia, unspecified: Secondary | ICD-10-CM | POA: Diagnosis present

## 2017-10-10 DIAGNOSIS — G9340 Encephalopathy, unspecified: Secondary | ICD-10-CM | POA: Diagnosis not present

## 2017-10-10 DIAGNOSIS — F329 Major depressive disorder, single episode, unspecified: Secondary | ICD-10-CM | POA: Diagnosis not present

## 2017-10-10 DIAGNOSIS — F322 Major depressive disorder, single episode, severe without psychotic features: Secondary | ICD-10-CM | POA: Diagnosis present

## 2017-10-10 DIAGNOSIS — I679 Cerebrovascular disease, unspecified: Secondary | ICD-10-CM | POA: Diagnosis not present

## 2017-10-10 DIAGNOSIS — E1122 Type 2 diabetes mellitus with diabetic chronic kidney disease: Secondary | ICD-10-CM | POA: Diagnosis present

## 2017-10-10 DIAGNOSIS — N183 Chronic kidney disease, stage 3 (moderate): Secondary | ICD-10-CM | POA: Diagnosis present

## 2017-10-10 DIAGNOSIS — D509 Iron deficiency anemia, unspecified: Secondary | ICD-10-CM | POA: Diagnosis present

## 2017-10-10 LAB — CBG MONITORING, ED: Glucose-Capillary: 124 mg/dL — ABNORMAL HIGH (ref 65–99)

## 2017-10-10 MED ORDER — OLANZAPINE 5 MG PO TBDP
5.0000 mg | ORAL_TABLET | Freq: Once | ORAL | Status: AC
  Administered 2017-10-10: 5 mg via ORAL
  Filled 2017-10-10 (×2): qty 1

## 2017-10-10 NOTE — ED Notes (Signed)
Dr. Leonides Schanz informed of pt slamming doors and threatening staff.  Orders received.

## 2017-10-10 NOTE — ED Notes (Signed)
Son called, message left about transport.

## 2017-10-10 NOTE — ED Notes (Signed)
Pt stated "they think I'm crazy and want to kill myself.  I have 2 guns.  I don't want to kill myself.  My son that lives with me used to work but he has this thing in his head so I asked them if he could put him on mine and he gets $100 less than me."

## 2017-10-10 NOTE — ED Notes (Signed)
Received call from [facility] stating he would call back with a pickup time.  Informed Sgt Paschal, Lisette Grinder will be the next shift RN.

## 2017-10-10 NOTE — ED Notes (Signed)
Pt slamming door threatening staff.

## 2017-10-10 NOTE — ED Notes (Signed)
Pt up Eric [name], [place] door & closing curtain stating "Shut the damn door."  Attempted to reorient pt.  Pt stated "I'm at home.  I'm not at the damn hospital."

## 2017-10-10 NOTE — ED Provider Notes (Signed)
Pt accepted to [facility]. Will transfer stable.    Francine Graven, DO 10/10/17 [phone]

## 2017-10-10 NOTE — ED Notes (Signed)
Pt refused med.

## 2017-10-10 NOTE — ED Notes (Signed)
Report given to RN Cierra.

## 2017-10-10 NOTE — ED Notes (Signed)
Pt assisted back to bed

## 2017-10-10 NOTE — ED Notes (Signed)
Pt agitated, getting up OOB, cursing stating "turn off the damn lights.  We don't need to pay a $350.00 power bill.  I need to pee."  Pt provided assistance with urinal.

## 2017-10-10 NOTE — ED Notes (Signed)
Pt stating "I have a big bottle of tylenol in the kitchen.  500 mg.  I don't have a nurse.  I've got 3 doctors."  Security @ BS.  Pt stating to [employer], "yea, you were out at my house the other day and stayed 2-3 hours."

## 2017-10-10 NOTE — ED Notes (Signed)
Dr. Leonides Schanz informed of pt's b/p.

## 2017-10-14 DIAGNOSIS — I480 Paroxysmal atrial fibrillation: Secondary | ICD-10-CM | POA: Diagnosis not present

## 2017-10-14 DIAGNOSIS — F0391 Unspecified dementia with behavioral disturbance: Secondary | ICD-10-CM | POA: Diagnosis not present

## 2017-10-14 DIAGNOSIS — E1142 Type 2 diabetes mellitus with diabetic polyneuropathy: Secondary | ICD-10-CM | POA: Diagnosis not present

## 2017-10-14 DIAGNOSIS — F322 Major depressive disorder, single episode, severe without psychotic features: Secondary | ICD-10-CM | POA: Diagnosis not present

## 2017-10-14 DIAGNOSIS — F0151 Vascular dementia with behavioral disturbance: Secondary | ICD-10-CM | POA: Diagnosis not present

## 2017-10-14 DIAGNOSIS — Z8673 Personal history of transient ischemic attack (TIA), and cerebral infarction without residual deficits: Secondary | ICD-10-CM | POA: Diagnosis not present

## 2017-10-14 DIAGNOSIS — N183 Chronic kidney disease, stage 3 (moderate): Secondary | ICD-10-CM | POA: Diagnosis not present

## 2017-10-15 DIAGNOSIS — F322 Major depressive disorder, single episode, severe without psychotic features: Secondary | ICD-10-CM | POA: Diagnosis not present

## 2017-10-15 DIAGNOSIS — E1142 Type 2 diabetes mellitus with diabetic polyneuropathy: Secondary | ICD-10-CM | POA: Diagnosis not present

## 2017-10-15 DIAGNOSIS — N183 Chronic kidney disease, stage 3 (moderate): Secondary | ICD-10-CM | POA: Diagnosis not present

## 2017-10-15 DIAGNOSIS — F0391 Unspecified dementia with behavioral disturbance: Secondary | ICD-10-CM | POA: Diagnosis not present

## 2017-10-15 DIAGNOSIS — I480 Paroxysmal atrial fibrillation: Secondary | ICD-10-CM | POA: Diagnosis not present

## 2017-10-16 DIAGNOSIS — I480 Paroxysmal atrial fibrillation: Secondary | ICD-10-CM | POA: Diagnosis not present

## 2017-10-16 DIAGNOSIS — F322 Major depressive disorder, single episode, severe without psychotic features: Secondary | ICD-10-CM | POA: Diagnosis not present

## 2017-10-16 DIAGNOSIS — N183 Chronic kidney disease, stage 3 (moderate): Secondary | ICD-10-CM | POA: Diagnosis not present

## 2017-10-16 DIAGNOSIS — E1142 Type 2 diabetes mellitus with diabetic polyneuropathy: Secondary | ICD-10-CM | POA: Diagnosis not present

## 2017-10-17 DIAGNOSIS — I480 Paroxysmal atrial fibrillation: Secondary | ICD-10-CM | POA: Diagnosis not present

## 2017-10-17 DIAGNOSIS — E1142 Type 2 diabetes mellitus with diabetic polyneuropathy: Secondary | ICD-10-CM | POA: Diagnosis not present

## 2017-10-17 DIAGNOSIS — F322 Major depressive disorder, single episode, severe without psychotic features: Secondary | ICD-10-CM | POA: Diagnosis not present

## 2017-10-17 DIAGNOSIS — N183 Chronic kidney disease, stage 3 (moderate): Secondary | ICD-10-CM | POA: Diagnosis not present

## 2017-10-18 DIAGNOSIS — Z8673 Personal history of transient ischemic attack (TIA), and cerebral infarction without residual deficits: Secondary | ICD-10-CM | POA: Diagnosis not present

## 2017-10-18 DIAGNOSIS — F322 Major depressive disorder, single episode, severe without psychotic features: Secondary | ICD-10-CM | POA: Diagnosis not present

## 2017-10-18 DIAGNOSIS — F0391 Unspecified dementia with behavioral disturbance: Secondary | ICD-10-CM | POA: Diagnosis not present

## 2017-10-18 DIAGNOSIS — F0151 Vascular dementia with behavioral disturbance: Secondary | ICD-10-CM | POA: Diagnosis not present

## 2017-10-19 DIAGNOSIS — E1142 Type 2 diabetes mellitus with diabetic polyneuropathy: Secondary | ICD-10-CM | POA: Diagnosis not present

## 2017-10-19 DIAGNOSIS — I10 Essential (primary) hypertension: Secondary | ICD-10-CM | POA: Diagnosis not present

## 2017-10-19 DIAGNOSIS — I679 Cerebrovascular disease, unspecified: Secondary | ICD-10-CM | POA: Diagnosis not present

## 2017-10-19 DIAGNOSIS — Z8673 Personal history of transient ischemic attack (TIA), and cerebral infarction without residual deficits: Secondary | ICD-10-CM | POA: Diagnosis not present

## 2017-10-19 DIAGNOSIS — I480 Paroxysmal atrial fibrillation: Secondary | ICD-10-CM | POA: Diagnosis not present

## 2017-10-19 DIAGNOSIS — F0151 Vascular dementia with behavioral disturbance: Secondary | ICD-10-CM | POA: Diagnosis not present

## 2017-10-19 DIAGNOSIS — F0391 Unspecified dementia with behavioral disturbance: Secondary | ICD-10-CM | POA: Diagnosis not present

## 2017-10-19 DIAGNOSIS — N183 Chronic kidney disease, stage 3 (moderate): Secondary | ICD-10-CM | POA: Diagnosis not present

## 2017-10-19 DIAGNOSIS — F322 Major depressive disorder, single episode, severe without psychotic features: Secondary | ICD-10-CM | POA: Diagnosis not present

## 2017-10-20 DIAGNOSIS — F322 Major depressive disorder, single episode, severe without psychotic features: Secondary | ICD-10-CM | POA: Diagnosis not present

## 2017-10-20 DIAGNOSIS — I10 Essential (primary) hypertension: Secondary | ICD-10-CM | POA: Diagnosis not present

## 2017-10-20 DIAGNOSIS — E1142 Type 2 diabetes mellitus with diabetic polyneuropathy: Secondary | ICD-10-CM | POA: Diagnosis not present

## 2017-10-20 DIAGNOSIS — I679 Cerebrovascular disease, unspecified: Secondary | ICD-10-CM | POA: Diagnosis not present

## 2017-10-20 DIAGNOSIS — E538 Deficiency of other specified B group vitamins: Secondary | ICD-10-CM | POA: Diagnosis not present

## 2017-10-20 DIAGNOSIS — D509 Iron deficiency anemia, unspecified: Secondary | ICD-10-CM | POA: Diagnosis not present

## 2017-10-20 DIAGNOSIS — E7800 Pure hypercholesterolemia, unspecified: Secondary | ICD-10-CM | POA: Diagnosis not present

## 2017-10-20 DIAGNOSIS — N183 Chronic kidney disease, stage 3 (moderate): Secondary | ICD-10-CM | POA: Diagnosis not present

## 2017-10-20 DIAGNOSIS — F0391 Unspecified dementia with behavioral disturbance: Secondary | ICD-10-CM | POA: Diagnosis not present

## 2017-10-21 DIAGNOSIS — F0151 Vascular dementia with behavioral disturbance: Secondary | ICD-10-CM | POA: Diagnosis not present

## 2017-10-21 DIAGNOSIS — F322 Major depressive disorder, single episode, severe without psychotic features: Secondary | ICD-10-CM | POA: Diagnosis not present

## 2017-10-21 DIAGNOSIS — E1142 Type 2 diabetes mellitus with diabetic polyneuropathy: Secondary | ICD-10-CM | POA: Diagnosis not present

## 2017-10-21 DIAGNOSIS — I10 Essential (primary) hypertension: Secondary | ICD-10-CM | POA: Diagnosis not present

## 2017-10-22 DIAGNOSIS — I10 Essential (primary) hypertension: Secondary | ICD-10-CM | POA: Diagnosis not present

## 2017-10-22 DIAGNOSIS — I480 Paroxysmal atrial fibrillation: Secondary | ICD-10-CM | POA: Diagnosis not present

## 2017-10-22 DIAGNOSIS — F0391 Unspecified dementia with behavioral disturbance: Secondary | ICD-10-CM | POA: Diagnosis not present

## 2017-10-22 DIAGNOSIS — I679 Cerebrovascular disease, unspecified: Secondary | ICD-10-CM | POA: Diagnosis not present

## 2017-10-22 DIAGNOSIS — N183 Chronic kidney disease, stage 3 (moderate): Secondary | ICD-10-CM | POA: Diagnosis not present

## 2017-10-22 DIAGNOSIS — E1142 Type 2 diabetes mellitus with diabetic polyneuropathy: Secondary | ICD-10-CM | POA: Diagnosis not present

## 2017-10-22 DIAGNOSIS — F322 Major depressive disorder, single episode, severe without psychotic features: Secondary | ICD-10-CM | POA: Diagnosis not present

## 2017-10-23 DIAGNOSIS — I10 Essential (primary) hypertension: Secondary | ICD-10-CM | POA: Diagnosis not present

## 2017-10-23 DIAGNOSIS — N183 Chronic kidney disease, stage 3 (moderate): Secondary | ICD-10-CM | POA: Diagnosis not present

## 2017-10-23 DIAGNOSIS — I480 Paroxysmal atrial fibrillation: Secondary | ICD-10-CM | POA: Diagnosis not present

## 2017-10-23 DIAGNOSIS — R4182 Altered mental status, unspecified: Secondary | ICD-10-CM | POA: Diagnosis not present

## 2017-10-23 DIAGNOSIS — I679 Cerebrovascular disease, unspecified: Secondary | ICD-10-CM | POA: Diagnosis not present

## 2017-10-23 DIAGNOSIS — Z7409 Other reduced mobility: Secondary | ICD-10-CM | POA: Diagnosis not present

## 2017-10-23 DIAGNOSIS — E1142 Type 2 diabetes mellitus with diabetic polyneuropathy: Secondary | ICD-10-CM | POA: Diagnosis not present

## 2017-10-23 DIAGNOSIS — F322 Major depressive disorder, single episode, severe without psychotic features: Secondary | ICD-10-CM | POA: Diagnosis not present

## 2017-10-23 DIAGNOSIS — F0151 Vascular dementia with behavioral disturbance: Secondary | ICD-10-CM | POA: Diagnosis not present

## 2017-10-25 ENCOUNTER — Emergency Department (HOSPITAL_COMMUNITY): Payer: Medicare Other

## 2017-10-25 ENCOUNTER — Inpatient Hospital Stay (HOSPITAL_COMMUNITY)
Admission: EM | Admit: 2017-10-25 | Discharge: 2017-10-30 | DRG: 871 | Disposition: A | Discharge status: Skilled Nursing Facility | Payer: Medicare Other | Attending: Internal Medicine | Admitting: Internal Medicine

## 2017-10-25 ENCOUNTER — Inpatient Hospital Stay (HOSPITAL_COMMUNITY): Payer: Medicare Other

## 2017-10-25 ENCOUNTER — Encounter (HOSPITAL_COMMUNITY): Payer: Self-pay | Admitting: Emergency Medicine

## 2017-10-25 DIAGNOSIS — IMO0001 Reserved for inherently not codable concepts without codable children: Secondary | ICD-10-CM

## 2017-10-25 DIAGNOSIS — F0280 Dementia in other diseases classified elsewhere without behavioral disturbance: Secondary | ICD-10-CM | POA: Diagnosis not present

## 2017-10-25 DIAGNOSIS — R069 Unspecified abnormalities of breathing: Secondary | ICD-10-CM | POA: Diagnosis not present

## 2017-10-25 DIAGNOSIS — I2510 Atherosclerotic heart disease of native coronary artery without angina pectoris: Secondary | ICD-10-CM | POA: Diagnosis present

## 2017-10-25 DIAGNOSIS — J189 Pneumonia, unspecified organism: Secondary | ICD-10-CM | POA: Diagnosis not present

## 2017-10-25 DIAGNOSIS — R32 Unspecified urinary incontinence: Secondary | ICD-10-CM | POA: Diagnosis present

## 2017-10-25 DIAGNOSIS — G9341 Metabolic encephalopathy: Secondary | ICD-10-CM | POA: Diagnosis not present

## 2017-10-25 DIAGNOSIS — N189 Chronic kidney disease, unspecified: Secondary | ICD-10-CM

## 2017-10-25 DIAGNOSIS — E1142 Type 2 diabetes mellitus with diabetic polyneuropathy: Secondary | ICD-10-CM

## 2017-10-25 DIAGNOSIS — N183 Chronic kidney disease, stage 3 unspecified: Secondary | ICD-10-CM | POA: Diagnosis present

## 2017-10-25 DIAGNOSIS — I679 Cerebrovascular disease, unspecified: Secondary | ICD-10-CM | POA: Diagnosis present

## 2017-10-25 DIAGNOSIS — Z96642 Presence of left artificial hip joint: Secondary | ICD-10-CM | POA: Diagnosis present

## 2017-10-25 DIAGNOSIS — Z880 Allergy status to penicillin: Secondary | ICD-10-CM

## 2017-10-25 DIAGNOSIS — Z8673 Personal history of transient ischemic attack (TIA), and cerebral infarction without residual deficits: Secondary | ICD-10-CM

## 2017-10-25 DIAGNOSIS — R6520 Severe sepsis without septic shock: Secondary | ICD-10-CM | POA: Diagnosis not present

## 2017-10-25 DIAGNOSIS — I080 Rheumatic disorders of both mitral and aortic valves: Secondary | ICD-10-CM | POA: Diagnosis present

## 2017-10-25 DIAGNOSIS — F0391 Unspecified dementia with behavioral disturbance: Secondary | ICD-10-CM | POA: Diagnosis present

## 2017-10-25 DIAGNOSIS — Z794 Long term (current) use of insulin: Secondary | ICD-10-CM

## 2017-10-25 DIAGNOSIS — K219 Gastro-esophageal reflux disease without esophagitis: Secondary | ICD-10-CM | POA: Diagnosis present

## 2017-10-25 DIAGNOSIS — J440 Chronic obstructive pulmonary disease with acute lower respiratory infection: Secondary | ICD-10-CM | POA: Diagnosis present

## 2017-10-25 DIAGNOSIS — L89159 Pressure ulcer of sacral region, unspecified stage: Secondary | ICD-10-CM | POA: Diagnosis present

## 2017-10-25 DIAGNOSIS — E1122 Type 2 diabetes mellitus with diabetic chronic kidney disease: Secondary | ICD-10-CM | POA: Diagnosis present

## 2017-10-25 DIAGNOSIS — F0390 Unspecified dementia without behavioral disturbance: Secondary | ICD-10-CM | POA: Diagnosis present

## 2017-10-25 DIAGNOSIS — J449 Chronic obstructive pulmonary disease, unspecified: Secondary | ICD-10-CM | POA: Diagnosis present

## 2017-10-25 DIAGNOSIS — E1149 Type 2 diabetes mellitus with other diabetic neurological complication: Secondary | ICD-10-CM | POA: Diagnosis present

## 2017-10-25 DIAGNOSIS — A419 Sepsis, unspecified organism: Secondary | ICD-10-CM | POA: Diagnosis not present

## 2017-10-25 DIAGNOSIS — E1151 Type 2 diabetes mellitus with diabetic peripheral angiopathy without gangrene: Secondary | ICD-10-CM | POA: Diagnosis present

## 2017-10-25 DIAGNOSIS — J181 Lobar pneumonia, unspecified organism: Secondary | ICD-10-CM | POA: Diagnosis present

## 2017-10-25 DIAGNOSIS — Z888 Allergy status to other drugs, medicaments and biological substances status: Secondary | ICD-10-CM

## 2017-10-25 DIAGNOSIS — E7800 Pure hypercholesterolemia, unspecified: Secondary | ICD-10-CM | POA: Diagnosis present

## 2017-10-25 DIAGNOSIS — R000 Tachycardia, unspecified: Secondary | ICD-10-CM | POA: Diagnosis not present

## 2017-10-25 DIAGNOSIS — I5032 Chronic diastolic (congestive) heart failure: Secondary | ICD-10-CM | POA: Diagnosis present

## 2017-10-25 DIAGNOSIS — Z79899 Other long term (current) drug therapy: Secondary | ICD-10-CM

## 2017-10-25 DIAGNOSIS — D631 Anemia in chronic kidney disease: Secondary | ICD-10-CM | POA: Diagnosis present

## 2017-10-25 DIAGNOSIS — R402413 Glasgow coma scale score 13-15, at hospital admission: Secondary | ICD-10-CM | POA: Diagnosis present

## 2017-10-25 DIAGNOSIS — I4891 Unspecified atrial fibrillation: Secondary | ICD-10-CM | POA: Diagnosis not present

## 2017-10-25 DIAGNOSIS — Z951 Presence of aortocoronary bypass graft: Secondary | ICD-10-CM | POA: Diagnosis not present

## 2017-10-25 DIAGNOSIS — E1340 Other specified diabetes mellitus with diabetic neuropathy, unspecified: Secondary | ICD-10-CM | POA: Diagnosis not present

## 2017-10-25 DIAGNOSIS — Z8679 Personal history of other diseases of the circulatory system: Secondary | ICD-10-CM

## 2017-10-25 DIAGNOSIS — I5030 Unspecified diastolic (congestive) heart failure: Secondary | ICD-10-CM | POA: Diagnosis not present

## 2017-10-25 DIAGNOSIS — R1311 Dysphagia, oral phase: Secondary | ICD-10-CM | POA: Diagnosis present

## 2017-10-25 DIAGNOSIS — Z87891 Personal history of nicotine dependence: Secondary | ICD-10-CM

## 2017-10-25 DIAGNOSIS — R05 Cough: Secondary | ICD-10-CM | POA: Diagnosis not present

## 2017-10-25 DIAGNOSIS — I5031 Acute diastolic (congestive) heart failure: Secondary | ICD-10-CM | POA: Diagnosis present

## 2017-10-25 DIAGNOSIS — I130 Hypertensive heart and chronic kidney disease with heart failure and stage 1 through stage 4 chronic kidney disease, or unspecified chronic kidney disease: Secondary | ICD-10-CM | POA: Diagnosis present

## 2017-10-25 DIAGNOSIS — E119 Type 2 diabetes mellitus without complications: Secondary | ICD-10-CM | POA: Diagnosis not present

## 2017-10-25 DIAGNOSIS — F322 Major depressive disorder, single episode, severe without psychotic features: Secondary | ICD-10-CM | POA: Diagnosis not present

## 2017-10-25 DIAGNOSIS — E785 Hyperlipidemia, unspecified: Secondary | ICD-10-CM | POA: Diagnosis present

## 2017-10-25 DIAGNOSIS — D509 Iron deficiency anemia, unspecified: Secondary | ICD-10-CM | POA: Diagnosis not present

## 2017-10-25 DIAGNOSIS — I700 Atherosclerosis of aorta: Secondary | ICD-10-CM | POA: Diagnosis present

## 2017-10-25 DIAGNOSIS — Y95 Nosocomial condition: Secondary | ICD-10-CM | POA: Diagnosis present

## 2017-10-25 DIAGNOSIS — R4182 Altered mental status, unspecified: Secondary | ICD-10-CM | POA: Diagnosis not present

## 2017-10-25 DIAGNOSIS — M6281 Muscle weakness (generalized): Secondary | ICD-10-CM | POA: Diagnosis present

## 2017-10-25 DIAGNOSIS — Z7901 Long term (current) use of anticoagulants: Secondary | ICD-10-CM

## 2017-10-25 DIAGNOSIS — D649 Anemia, unspecified: Secondary | ICD-10-CM | POA: Diagnosis present

## 2017-10-25 DIAGNOSIS — N179 Acute kidney failure, unspecified: Secondary | ICD-10-CM | POA: Diagnosis present

## 2017-10-25 DIAGNOSIS — I10 Essential (primary) hypertension: Secondary | ICD-10-CM | POA: Diagnosis not present

## 2017-10-25 DIAGNOSIS — R531 Weakness: Secondary | ICD-10-CM | POA: Diagnosis not present

## 2017-10-25 DIAGNOSIS — Z955 Presence of coronary angioplasty implant and graft: Secondary | ICD-10-CM

## 2017-10-25 DIAGNOSIS — F339 Major depressive disorder, recurrent, unspecified: Secondary | ICD-10-CM | POA: Diagnosis not present

## 2017-10-25 DIAGNOSIS — E1159 Type 2 diabetes mellitus with other circulatory complications: Secondary | ICD-10-CM | POA: Diagnosis not present

## 2017-10-25 DIAGNOSIS — J180 Bronchopneumonia, unspecified organism: Secondary | ICD-10-CM | POA: Diagnosis not present

## 2017-10-25 DIAGNOSIS — E11649 Type 2 diabetes mellitus with hypoglycemia without coma: Secondary | ICD-10-CM | POA: Diagnosis not present

## 2017-10-25 DIAGNOSIS — I480 Paroxysmal atrial fibrillation: Secondary | ICD-10-CM | POA: Diagnosis present

## 2017-10-25 DIAGNOSIS — E118 Type 2 diabetes mellitus with unspecified complications: Secondary | ICD-10-CM | POA: Diagnosis not present

## 2017-10-25 DIAGNOSIS — E1140 Type 2 diabetes mellitus with diabetic neuropathy, unspecified: Secondary | ICD-10-CM | POA: Diagnosis present

## 2017-10-25 DIAGNOSIS — F0151 Vascular dementia with behavioral disturbance: Secondary | ICD-10-CM | POA: Diagnosis not present

## 2017-10-25 DIAGNOSIS — N200 Calculus of kidney: Secondary | ICD-10-CM | POA: Diagnosis not present

## 2017-10-25 DIAGNOSIS — Z952 Presence of prosthetic heart valve: Secondary | ICD-10-CM

## 2017-10-25 HISTORY — DX: Unspecified atrial fibrillation: I48.91

## 2017-10-25 LAB — COMPREHENSIVE METABOLIC PANEL
ALBUMIN: 3.3 g/dL — AB (ref 3.5–5.0)
ALK PHOS: 81 U/L (ref 38–126)
ALT: 9 U/L — ABNORMAL LOW (ref 17–63)
ANION GAP: 17 — AB (ref 5–15)
AST: 21 U/L (ref 15–41)
BILIRUBIN TOTAL: 1.2 mg/dL (ref 0.3–1.2)
BUN: 22 mg/dL — ABNORMAL HIGH (ref 6–20)
CALCIUM: 9.1 mg/dL (ref 8.9–10.3)
CO2: 22 mmol/L (ref 22–32)
Chloride: 98 mmol/L — ABNORMAL LOW (ref 101–111)
Creatinine, Ser: 1.98 mg/dL — ABNORMAL HIGH (ref 0.61–1.24)
GFR calc non Af Amer: 28 mL/min — ABNORMAL LOW (ref >60)
GFR, EST AFRICAN AMERICAN: 33 mL/min — AB (ref >60)
GLUCOSE: 271 mg/dL — AB (ref 65–99)
POTASSIUM: 3.7 mmol/L (ref 3.5–5.1)
SODIUM: 137 mmol/L (ref 135–145)
TOTAL PROTEIN: 7.8 g/dL (ref 6.5–8.1)

## 2017-10-25 LAB — I-STAT CG4 LACTIC ACID, ED
Lactic Acid, Venous: 5.07 mmol/L (ref 0.5–1.9)
Lactic Acid, Venous: 4.85 mmol/L (ref 0.5–1.9)

## 2017-10-25 LAB — CBC WITH DIFFERENTIAL/PLATELET
BASOS PCT: 0 %
Basophils Absolute: 0 10*3/uL (ref 0.0–0.1)
EOS ABS: 0.1 10*3/uL (ref 0.0–0.7)
Eosinophils Relative: 1 %
HEMATOCRIT: 39.5 % (ref 39.0–52.0)
Hemoglobin: 12.8 g/dL — ABNORMAL LOW (ref 13.0–17.0)
LYMPHS ABS: 1.7 10*3/uL (ref 0.7–4.0)
Lymphocytes Relative: 17 %
MCH: 31.1 pg (ref 26.0–34.0)
MCHC: 32.4 g/dL (ref 30.0–36.0)
MCV: 95.9 fL (ref 78.0–100.0)
MONO ABS: 0.6 10*3/uL (ref 0.1–1.0)
Monocytes Relative: 6 %
Neutro Abs: 7.9 10*3/uL — ABNORMAL HIGH (ref 1.7–7.7)
Neutrophils Relative %: 76 %
Platelets: 363 10*3/uL (ref 150–400)
RBC: 4.12 MIL/uL — ABNORMAL LOW (ref 4.22–5.81)
RDW: 13.6 % (ref 11.5–15.5)
WBC: 10.4 10*3/uL (ref 4.0–10.5)

## 2017-10-25 LAB — URINALYSIS, ROUTINE W REFLEX MICROSCOPIC
BILIRUBIN URINE: NEGATIVE
Glucose, UA: 50 mg/dL — AB
Hgb urine dipstick: NEGATIVE
Ketones, ur: 5 mg/dL — AB
LEUKOCYTES UA: NEGATIVE
Nitrite: NEGATIVE
PH: 6 (ref 5.0–8.0)
Protein, ur: 30 mg/dL — AB
SPECIFIC GRAVITY, URINE: 1.015 (ref 1.005–1.030)
SQUAMOUS EPITHELIAL / LPF: NONE SEEN

## 2017-10-25 LAB — I-STAT TROPONIN, ED: Troponin i, poc: 0.03 ng/mL (ref 0.00–0.08)

## 2017-10-25 MED ORDER — GENERIC EXTERNAL MEDICATION
Status: DC
Start: ? — End: 2017-10-25

## 2017-10-25 MED ORDER — ACETAMINOPHEN 325 MG PO TABS
650.00 | ORAL_TABLET | ORAL | Status: DC
Start: ? — End: 2017-10-25

## 2017-10-25 MED ORDER — DEXTROSE 5 % IV SOLN
1.0000 g | Freq: Once | INTRAVENOUS | Status: AC
  Administered 2017-10-25: 1 g via INTRAVENOUS
  Filled 2017-10-25: qty 10

## 2017-10-25 MED ORDER — LOPERAMIDE HCL 2 MG PO CAPS
2.00 | ORAL_CAPSULE | ORAL | Status: DC
Start: ? — End: 2017-10-25

## 2017-10-25 MED ORDER — ONDANSETRON HCL 4 MG/2ML IJ SOLN
4.0000 mg | Freq: Once | INTRAMUSCULAR | Status: AC
  Administered 2017-10-25 (×2): 4 mg via INTRAVENOUS

## 2017-10-25 MED ORDER — ICY HOT BALM EXTRA STRENGTH 7.6-29 % EX OINT
TOPICAL_OINTMENT | CUTANEOUS | Status: DC
Start: ? — End: 2017-10-25

## 2017-10-25 MED ORDER — DEXTROSE 5 % IV SOLN: 1.0000 g | INTRAVENOUS | Status: DC

## 2017-10-25 MED ORDER — ANTACID & ANTIGAS 200-200-20 MG/5ML PO SUSP
30.00 | ORAL | Status: DC
Start: ? — End: 2017-10-25

## 2017-10-25 MED ORDER — TRAZODONE HCL 50 MG PO TABS
50.00 | ORAL_TABLET | ORAL | Status: DC
Start: ? — End: 2017-10-25

## 2017-10-25 MED ORDER — INSULIN LISPRO 100 UNIT/ML ~~LOC~~ SOLN
SUBCUTANEOUS | Status: DC
Start: 2017-10-23 — End: 2017-10-25

## 2017-10-25 MED ORDER — SODIUM CHLORIDE 0.9 % IV BOLUS (SEPSIS): 1000.0000 mL | Freq: Once | INTRAVENOUS | Status: DC

## 2017-10-25 MED ORDER — LORATADINE 10 MG PO TABS
10.00 | ORAL_TABLET | ORAL | Status: DC
Start: ? — End: 2017-10-25

## 2017-10-25 MED ORDER — ALBUTEROL SULFATE HFA 108 (90 BASE) MCG/ACT IN AERS
INHALATION_SPRAY | RESPIRATORY_TRACT | Status: DC
Start: ? — End: 2017-10-25

## 2017-10-25 MED ORDER — FLUOXETINE HCL 20 MG PO CAPS
40.00 | ORAL_CAPSULE | ORAL | Status: DC
Start: 2017-10-24 — End: 2017-10-25

## 2017-10-25 MED ORDER — NITROGLYCERIN 0.4 MG SL SUBL
0.40 | SUBLINGUAL_TABLET | SUBLINGUAL | Status: DC
Start: ? — End: 2017-10-25

## 2017-10-25 MED ORDER — INSULIN GLARGINE 100 UNIT/ML ~~LOC~~ SOLN
SUBCUTANEOUS | Status: DC
Start: 2017-10-23 — End: 2017-10-25

## 2017-10-25 MED ORDER — POTASSIUM CHLORIDE CRYS ER 20 MEQ PO TBCR
EXTENDED_RELEASE_TABLET | ORAL | Status: DC
Start: 2017-10-24 — End: 2017-10-25

## 2017-10-25 MED ORDER — DEXTROSE 5 % IV SOLN
500.0000 mg | INTRAVENOUS | Status: DC
  Administered 2017-10-26 – 2017-10-28 (×3): 500 mg via INTRAVENOUS
  Filled 2017-10-25 (×3): qty 500

## 2017-10-25 MED ORDER — INSULIN LISPRO 100 UNIT/ML ~~LOC~~ SOLN
SUBCUTANEOUS | Status: DC
Start: ? — End: 2017-10-25

## 2017-10-25 MED ORDER — DONEPEZIL HCL 10 MG PO TABS
5.00 | ORAL_TABLET | ORAL | Status: DC
Start: 2017-10-23 — End: 2017-10-25

## 2017-10-25 MED ORDER — IBUPROFEN 200 MG PO TABS
200.00 | ORAL_TABLET | ORAL | Status: DC
Start: ? — End: 2017-10-25

## 2017-10-25 MED ORDER — VANCOMYCIN HCL IN DEXTROSE 1-5 GM/200ML-% IV SOLN
1000.0000 mg | Freq: Once | INTRAVENOUS | Status: AC
  Administered 2017-10-25: 1000 mg via INTRAVENOUS
  Filled 2017-10-25: qty 200

## 2017-10-25 MED ORDER — POLYVINYL ALCOHOL-POVIDONE PF 1.4-0.6 % OP SOLN
OPHTHALMIC | Status: DC
Start: ? — End: 2017-10-25

## 2017-10-25 MED ORDER — FERROUS SULFATE 325 (65 FE) MG PO TABS
325.00 | ORAL_TABLET | ORAL | Status: DC
Start: 2017-10-24 — End: 2017-10-25

## 2017-10-25 MED ORDER — GUAIFENESIN 100 MG/5ML PO LIQD
200.00 | ORAL | Status: DC
Start: ? — End: 2017-10-25

## 2017-10-25 MED ORDER — BENZOCAINE-MENTHOL 15-3.6 MG MT LOZG
LOZENGE | OROMUCOSAL | Status: DC
Start: ? — End: 2017-10-25

## 2017-10-25 MED ORDER — SODIUM CHLORIDE 0.9 % IV BOLUS (SEPSIS)
500.0000 mL | Freq: Once | INTRAVENOUS | Status: AC
  Administered 2017-10-25: 500 mL via INTRAVENOUS

## 2017-10-25 MED ORDER — SODIUM CHLORIDE 0.9 % IV BOLUS (SEPSIS)
1000.0000 mL | Freq: Once | INTRAVENOUS | Status: AC
  Administered 2017-10-25: 1000 mL via INTRAVENOUS

## 2017-10-25 MED ORDER — FAMOTIDINE 20 MG PO TABS
20.00 | ORAL_TABLET | ORAL | Status: DC
Start: 2017-10-24 — End: 2017-10-25

## 2017-10-25 MED ORDER — ONDANSETRON HCL 4 MG/2ML IJ SOLN
INTRAMUSCULAR | Status: AC
  Filled 2017-10-25: qty 2

## 2017-10-25 MED ORDER — ONDANSETRON HCL 4 MG/2ML IJ SOLN: 4.0000 mg | Freq: Once | INTRAMUSCULAR | Status: DC

## 2017-10-25 MED ORDER — ONDANSETRON HCL 4 MG/2ML IJ SOLN
INTRAMUSCULAR | Status: AC
  Administered 2017-10-25: 4 mg via INTRAVENOUS
  Filled 2017-10-25: qty 2

## 2017-10-25 MED ORDER — FUROSEMIDE 20 MG PO TABS
20.00 | ORAL_TABLET | ORAL | Status: DC
Start: 2017-10-23 — End: 2017-10-25

## 2017-10-25 MED ORDER — DILTIAZEM HCL 30 MG PO TABS
30.00 | ORAL_TABLET | ORAL | Status: DC
Start: 2017-10-23 — End: 2017-10-25

## 2017-10-25 MED ORDER — DEXTROSE 5 % IV SOLN
500.0000 mg | Freq: Once | INTRAVENOUS | Status: AC
  Administered 2017-10-25: 500 mg via INTRAVENOUS
  Filled 2017-10-25: qty 500

## 2017-10-25 MED ORDER — APIXABAN 2.5 MG PO TABS
2.50 | ORAL_TABLET | ORAL | Status: DC
Start: 2017-10-23 — End: 2017-10-25

## 2017-10-25 NOTE — ED Triage Notes (Signed)
Patient here from home via EMS with complaints of altered mental status. Family thinks that it may be UTI (incontinent). Also reports wheezing, 10 mg Albuterol, 5mg  Atrovent given. Denies pain.
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
## 2017-10-25 NOTE — ED Notes (Signed)
Pt currently in radiology.
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
## 2017-10-25 NOTE — Progress Notes (Signed)
PHARMACY NOTE -  Ceftriaxone/Azithromycin  Pharmacy has been consulted to dose Ceftriaxone and Azithromycin for CAP. Will order Ceftriaxone 1g IV q24h and Azithromycin 500 mg IV q24h x 7 days.  No renal adjustment needed; therefore, pharmacy will sign off since need for further dosage adjustment appears unlikely at present.    Please reconsult if a change in clinical status warrants re-evaluation of dosage.  Thank you for the consult.  Peggyann Juba, PharmD, BCPS Pager: [phone] 10/25/2017 4:09 PM

## 2017-10-25 NOTE — ED Notes (Signed)
Pt's step son(Eric [name]) and wife [phone]) called to inquire about pt's status. Eric [name] states pt's POA is pt's daughter Eric [name] [phone]), but unsure if HCPOA or just POA. Pt was just d/c home from a rehab facility on Friday 1/4. Pt's family is concerned pt needs 24/7 care. I have attempted to call Eric [name] and speak with her, but left a voice mail as she did not answer.

## 2017-10-26 ENCOUNTER — Ambulatory Visit: Payer: Medicare Other | Admitting: Physician Assistant

## 2017-10-26 ENCOUNTER — Other Ambulatory Visit: Payer: Self-pay

## 2017-10-26 ENCOUNTER — Inpatient Hospital Stay (HOSPITAL_COMMUNITY): Payer: Medicare Other

## 2017-10-26 DIAGNOSIS — I4891 Unspecified atrial fibrillation: Secondary | ICD-10-CM

## 2017-10-26 LAB — URINE CULTURE: Culture: NO GROWTH

## 2017-10-26 LAB — GLUCOSE, CAPILLARY
GLUCOSE-CAPILLARY: 372 mg/dL — AB (ref 65–99)
GLUCOSE-CAPILLARY: 261 mg/dL — AB (ref 65–99)
GLUCOSE-CAPILLARY: 264 mg/dL — AB (ref 65–99)
Glucose-Capillary: 96 mg/dL (ref 65–99)
Glucose-Capillary: 168 mg/dL — ABNORMAL HIGH (ref 65–99)

## 2017-10-26 LAB — COMPREHENSIVE METABOLIC PANEL
ALK PHOS: 66 U/L (ref 38–126)
ALT: 8 U/L — AB (ref 17–63)
ANION GAP: 7 (ref 5–15)
AST: 14 U/L — ABNORMAL LOW (ref 15–41)
Albumin: 2.6 g/dL — ABNORMAL LOW (ref 3.5–5.0)
BUN: 23 mg/dL — ABNORMAL HIGH (ref 6–20)
CALCIUM: 8 mg/dL — AB (ref 8.9–10.3)
CO2: 24 mmol/L (ref 22–32)
CREATININE: 1.83 mg/dL — AB (ref 0.61–1.24)
Chloride: 106 mmol/L (ref 101–111)
GFR calc non Af Amer: 31 mL/min — ABNORMAL LOW (ref >60)
GFR, EST AFRICAN AMERICAN: 36 mL/min — AB (ref >60)
Glucose, Bld: 300 mg/dL — ABNORMAL HIGH (ref 65–99)
Potassium: 4.6 mmol/L (ref 3.5–5.1)
Sodium: 137 mmol/L (ref 135–145)
TOTAL PROTEIN: 6.4 g/dL — AB (ref 6.5–8.1)
Total Bilirubin: 0.6 mg/dL (ref 0.3–1.2)

## 2017-10-26 LAB — TROPONIN I
Troponin I: <0.03 ng/mL (ref <0.03)
Troponin I: 0.03 ng/mL (ref <0.03)
Troponin I: <0.03 ng/mL (ref <0.03)

## 2017-10-26 LAB — ECHOCARDIOGRAM COMPLETE
CHL CUP DOP CALC LVOT VTI: 19.7 cm
CHL CUP RV SYS PRESS: 38 mmHg
E decel time: 292 msec
FS: 30 % (ref 28–44)
Height: 69 in
IVS/LV PW RATIO, ED: 1.08
LA ID, A-P, ES: 38 mm
LA diam end sys: 38 mm
LA diam index: 1.82 cm/m2
LAVOL: 66.8 mL
LAVOLA4C: 77.9 mL
LAVOLIN: 32 mL/m2
LDCA: 2.54 cm2
LV PW d: 12.3 mm — AB (ref 0.6–1.1)
LV SIMPSON'S DISK: 58
LV dias vol index: 34 mL/m2
LV dias vol: 71 mL (ref 62–150)
LVOT SV: 50 mL
LVOT diameter: 18 mm
LVOT peak vel: 106 cm/s
LVSYSVOL: 30 mL (ref 21–61)
LVSYSVOLIN: 14 mL/m2
MV Dec: 292
MVPG: 16 mmHg
MVPKEVEL: 198 m/s
RV TAPSE: 14.7 mm
Reg peak vel: 274 cm/s
Stroke v: 41 ml
TRMAXVEL: 274 cm/s
Weight: 3104.08 oz

## 2017-10-26 LAB — CBC
HCT: 32.4 % — ABNORMAL LOW (ref 39.0–52.0)
HEMOGLOBIN: 10.5 g/dL — AB (ref 13.0–17.0)
MCH: 31.2 pg (ref 26.0–34.0)
MCHC: 32.4 g/dL (ref 30.0–36.0)
MCV: 96.1 fL (ref 78.0–100.0)
Platelets: 298 10*3/uL (ref 150–400)
RBC: 3.37 MIL/uL — AB (ref 4.22–5.81)
RDW: 14.1 % (ref 11.5–15.5)
WBC: 7.6 10*3/uL (ref 4.0–10.5)

## 2017-10-26 LAB — LACTIC ACID, PLASMA: LACTIC ACID, VENOUS: 1 mmol/L (ref 0.5–1.9)

## 2017-10-26 LAB — MRSA PCR SCREENING: MRSA by PCR: NEGATIVE

## 2017-10-26 LAB — TSH: TSH: 1.046 u[IU]/mL (ref 0.350–4.500)

## 2017-10-26 LAB — STREP PNEUMONIAE URINARY ANTIGEN: STREP PNEUMO URINARY ANTIGEN: NEGATIVE

## 2017-10-26 LAB — HIV ANTIBODY (ROUTINE TESTING W REFLEX): HIV Screen 4th Generation wRfx: NONREACTIVE

## 2017-10-26 MED ORDER — SODIUM CHLORIDE 0.9 % IV SOLN
INTRAVENOUS | Status: DC
  Administered 2017-10-26: 15:00:00 via INTRAVENOUS

## 2017-10-26 MED ORDER — FAMOTIDINE 20 MG PO TABS
20.0000 mg | ORAL_TABLET | Freq: Every day | ORAL | Status: DC
  Administered 2017-10-26 – 2017-10-30 (×5): 20 mg via ORAL
  Filled 2017-10-26 (×5): qty 1

## 2017-10-26 MED ORDER — GENERIC EXTERNAL MEDICATION
Status: DC
Start: ? — End: 2017-10-26

## 2017-10-26 MED ORDER — INSULIN ASPART 100 UNIT/ML ~~LOC~~ SOLN
0.0000 [IU] | Freq: Three times a day (TID) | SUBCUTANEOUS | Status: DC
  Administered 2017-10-26: 8 [IU] via SUBCUTANEOUS
  Administered 2017-10-27 – 2017-10-28 (×4): 3 [IU] via SUBCUTANEOUS
  Administered 2017-10-29: 5 [IU] via SUBCUTANEOUS
  Administered 2017-10-29 (×2): 3 [IU] via SUBCUTANEOUS
  Administered 2017-10-30: 5 [IU] via SUBCUTANEOUS
  Administered 2017-10-30: 8 [IU] via SUBCUTANEOUS
  Administered 2017-10-30: 15 [IU] via SUBCUTANEOUS

## 2017-10-26 MED ORDER — INSULIN GLARGINE 100 UNIT/ML ~~LOC~~ SOLN
32.0000 [IU] | Freq: Every day | SUBCUTANEOUS | Status: DC
  Administered 2017-10-26: 32 [IU] via SUBCUTANEOUS
  Filled 2017-10-26 (×2): qty 0.32

## 2017-10-26 MED ORDER — APIXABAN 2.5 MG PO TABS
2.5000 mg | ORAL_TABLET | Freq: Two times a day (BID) | ORAL | Status: DC
  Administered 2017-10-26 – 2017-10-30 (×10): 2.5 mg via ORAL
  Filled 2017-10-26 (×10): qty 1

## 2017-10-26 MED ORDER — DILTIAZEM HCL 30 MG PO TABS
30.0000 mg | ORAL_TABLET | Freq: Two times a day (BID) | ORAL | Status: DC
  Administered 2017-10-26 – 2017-10-30 (×9): 30 mg via ORAL
  Filled 2017-10-26 (×10): qty 1

## 2017-10-26 MED ORDER — INSULIN ASPART 100 UNIT/ML ~~LOC~~ SOLN
0.0000 [IU] | Freq: Every day | SUBCUTANEOUS | Status: DC
  Administered 2017-10-28: 3 [IU] via SUBCUTANEOUS
  Administered 2017-10-29: 2 [IU] via SUBCUTANEOUS
  Administered 2017-10-30: 3 [IU] via SUBCUTANEOUS

## 2017-10-26 MED ORDER — SODIUM CHLORIDE 0.9 % IV SOLN
INTRAVENOUS | Status: AC
  Administered 2017-10-26: 01:00:00 via INTRAVENOUS

## 2017-10-26 MED ORDER — DEXTROSE 5 % IV SOLN
1.0000 g | Freq: Two times a day (BID) | INTRAVENOUS | Status: DC
  Administered 2017-10-27 – 2017-10-29 (×5): 1 g via INTRAVENOUS
  Filled 2017-10-26 (×5): qty 1

## 2017-10-26 MED ORDER — INSULIN GLARGINE 100 UNIT/ML ~~LOC~~ SOLN
29.0000 [IU] | Freq: Every day | SUBCUTANEOUS | Status: DC
  Administered 2017-10-26: 29 [IU] via SUBCUTANEOUS
  Filled 2017-10-26 (×2): qty 0.29

## 2017-10-26 MED ORDER — NAPHAZOLINE-GLYCERIN 0.012-0.2 % OP SOLN
1.0000 [drp] | Freq: Four times a day (QID) | OPHTHALMIC | Status: DC | PRN
  Filled 2017-10-26: qty 15

## 2017-10-26 MED ORDER — SODIUM CHLORIDE 0.9 % IV SOLN
INTRAVENOUS | Status: DC
  Administered 2017-10-26: 18:00:00 via INTRAVENOUS

## 2017-10-26 MED ORDER — DEXTROSE 5 % IV SOLN: 1.0000 g | Freq: Two times a day (BID) | INTRAVENOUS | Status: DC

## 2017-10-26 MED ORDER — FERROUS SULFATE 325 (65 FE) MG PO TABS
325.0000 mg | ORAL_TABLET | Freq: Every day | ORAL | Status: DC
  Administered 2017-10-26 – 2017-10-30 (×5): 325 mg via ORAL
  Filled 2017-10-26 (×5): qty 1

## 2017-10-26 MED ORDER — FUROSEMIDE 20 MG PO TABS
20.0000 mg | ORAL_TABLET | Freq: Two times a day (BID) | ORAL | Status: DC
  Administered 2017-10-26: 20 mg via ORAL
  Filled 2017-10-26: qty 1

## 2017-10-26 MED ORDER — ACETAMINOPHEN 325 MG PO TABS
650.0000 mg | ORAL_TABLET | Freq: Four times a day (QID) | ORAL | Status: DC | PRN
  Administered 2017-10-26 – 2017-10-28 (×5): 650 mg via ORAL
  Filled 2017-10-26 (×6): qty 2

## 2017-10-26 MED ORDER — ATORVASTATIN CALCIUM 20 MG PO TABS
20.0000 mg | ORAL_TABLET | Freq: Every day | ORAL | Status: DC
  Administered 2017-10-26 – 2017-10-30 (×5): 20 mg via ORAL
  Filled 2017-10-26: qty 1
  Filled 2017-10-26 (×3): qty 2
  Filled 2017-10-26: qty 1

## 2017-10-26 MED ORDER — CALCIUM CARBONATE ANTACID 500 MG PO CHEW
1.0000 | CHEWABLE_TABLET | Freq: Once | ORAL | Status: AC
  Administered 2017-10-26: 200 mg via ORAL
  Filled 2017-10-26: qty 1

## 2017-10-26 MED ORDER — PHENOL 1.4 % MT LIQD
1.0000 | OROMUCOSAL | Status: DC | PRN
  Filled 2017-10-26: qty 177

## 2017-10-26 MED ORDER — DEXTROSE 5 % IV SOLN
2.0000 g | Freq: Two times a day (BID) | INTRAVENOUS | Status: DC
  Administered 2017-10-26 (×2): 2 g via INTRAVENOUS
  Filled 2017-10-26 (×2): qty 2

## 2017-10-26 MED ORDER — VANCOMYCIN HCL 10 G IV SOLR: 1250.0000 mg | INTRAVENOUS | Status: DC

## 2017-10-26 NOTE — Progress Notes (Signed)
PROGRESS NOTE    Eric [name]  IRC:[id number] DOB: 1927/06/17 DOA: 10/25/2017 PCP: [name], MD  Brief Narrative:   84 y.o. male, w dm2, hypertension, hyperlipidemia, CAD s/p CABG, h/o endocarditis, h/o AVR, Copd, apparently was recent at [facility] psych unit for dementia and now transitioned to home, and was brought in tonight for generalized weakness, and some confusion.  EMS apparently noted some wheezing and gave albuterol and atrovent.  Pt has been coughing .  In ED,  CT brain  IMPRESSION: 1. No acute intracranial pathology.  CXR IMPRESSION: Postsurgical changes of CABG and AVR.  Mild LEFT basilar atelectasis versus infiltrate.  Na 137, K 3.7 Bun 22, Creatinine 1.98 Ast 21, Alt 9 Glucose 271 Wbc 10.4, Hgb 12.8, Plt 363  Trop 0.03  Urinalysis , prot 30, wbc 0-5  La 4.85  Pt will be admitted for ? Sepsis (tachycardia, elevation in lactic acid, CXR=> infiltrate), and Hcap     Assessment & Plan:   Principal Problem:   Pneumonia Active Problems:   Diabetic neuropathy associated with type 2 diabetes mellitus ([name]   Atrial fibrillation with RVR ([name]   Acute renal failure (ARF) ([name]   Tachycardia   Anemia   HCAP (healthcare-associated pneumonia)  1]LLL Pneumonia/HCAP/SEPSIS Patient is on maxipime and azithro.received rocephin in er.mrsa pcr negative.received a dose of vanco.will dc vanco. 2]afib rvr resolved.continue cardizem and eliquis. 3]dm-continue lantus and ssi 4]acute on chronic ckd-slow ivf.monitor daily 5]diastolic chf stable.ef 55%.hold lasix for now.slow iv hydration.restatrt lasix when appropriate. 6]hyperlipedemia-continue lipitor. 7]dementia on aricept     DVT prophylaxis: lovenox Code Status: full Family Communication: I do not have number for the daughter who wanted me to call her.i called the son who kept saying he does not have his sisters number infront of him.. Disposition Plan:  tbd Consultants: none  Procedures:  none Antimicrobials: maxipime azithro Subjective: Says didn't sleep last night..  Objective: Vitals:   10/26/17 1000 10/26/17 1100 10/26/17 1200 10/26/17 1300  BP: (!) 100/52 (!) 101/57  124/67  Pulse: 80 80  77  Resp: 19 19  20   Temp:   98 F (36.7 C)   TempSrc:   Axillary   SpO2: 98% 98%  99%  Weight:      Height:        Intake/Output Summary (Last 24 hours) at 10/26/2017 1417 Last data filed at 10/26/2017 1300 Gross per 24 hour  Intake 4624.17 ml  Output 460 ml  Net 4164.17 ml   Filed Weights   10/26/17 0115  Weight: 88 kg (194 lb 0.1 oz)    Examination:  General exam: Appears calm and comfortable  Respiratory system: few rhonchi auscultation. Respiratory effort normal. Cardiovascular system: S1 & S2 heard, RRR. No JVD, murmurs, rubs, gallops or clicks. No pedal edema. Gastrointestinal system: Abdomen is nondistended, soft and nontender. No organomegaly or masses felt. Normal bowel sounds heard. Central nervous system: Alert and oriented. No focal neurological deficits. Extremities: Symmetric 5 x 5 power. Skin: No rashes, lesions or ulcers Psychiatry: Judgement and insight appear normal. Mood & affect appropriate.     Data Reviewed: I have personally reviewed following labs and imaging studies  CBC: Recent Labs  Lab 10/25/17 1640 10/26/17 0750  WBC 10.4 7.6  NEUTROABS 7.9*  --   HGB 12.8* 10.5*  HCT 39.5 32.4*  MCV 95.9 96.1  PLT 363 510   Basic Metabolic Panel: Recent Labs  Lab 10/25/17 1640 10/26/17 0750  NA 137 137  K 3.7 4.6  CL 98* 106  CO2 22 24  GLUCOSE 271* 300*  BUN 22* 23*  CREATININE 1.98* 1.83*  CALCIUM 9.1 8.0*   GFR: Estimated Creatinine Clearance: 30.6 mL/min (A) (by C-G formula based on SCr of 1.83 mg/dL (H)). Liver Function Tests: Recent Labs  Lab 10/25/17 1640 10/26/17 0750  AST 21 14*  ALT 9* 8*  ALKPHOS 81 66  BILITOT 1.2 0.6  PROT 7.8 6.4*  ALBUMIN 3.3* 2.6*   No results for input(s): LIPASE, AMYLASE in the  last 168 hours. No results for input(s): AMMONIA in the last 168 hours. Coagulation Profile: No results for input(s): INR, PROTIME in the last 168 hours. Cardiac Enzymes: Recent Labs  Lab 10/26/17 0215  TROPONINI <0.03   BNP (last 3 results) No results for input(s): PROBNP in the last 8760 hours. HbA1C: No results for input(s): HGBA1C in the last 72 hours. CBG: Recent Labs  Lab 10/26/17 0115 10/26/17 0820 10/26/17 1207  GLUCAP 372* 261* 264*   Lipid Profile: No results for input(s): CHOL, HDL, LDLCALC, TRIG, CHOLHDL, LDLDIRECT in the last 72 hours. Thyroid Function Tests: Recent Labs    10/26/17 0215  TSH 1.046   Anemia Panel: No results for input(s): VITAMINB12, FOLATE, FERRITIN, TIBC, IRON, RETICCTPCT in the last 72 hours. Sepsis Labs: Recent Labs  Lab 10/25/17 1648 10/25/17 1819 10/26/17 1044  LATICACIDVEN 5.07* 4.85* 1.0    Recent Results (from the past 240 hour(s))  Blood culture (routine x 2)     Status: None (Preliminary result)   Collection Time: 10/25/17  4:34 PM  Result Value Ref Range Status   Specimen Description BLOOD RIGHT ANTECUBITAL  Final   Special Requests   Final    BOTTLES DRAWN AEROBIC AND ANAEROBIC Blood Culture adequate volume   Culture   Final    NO GROWTH < 24 HOURS Performed at [facility], [place] [address] [place], [place] [address]    Report Status PENDING  Incomplete  Blood culture (routine x 2)     Status: None (Preliminary result)   Collection Time: 10/25/17  4:40 PM  Result Value Ref Range Status   Specimen Description BLOOD LEFT ANTECUBITAL  Final   Special Requests   Final    BOTTLES DRAWN AEROBIC AND ANAEROBIC Blood Culture adequate volume   Culture   Final    NO GROWTH < 24 HOURS Performed at [facility], [place] [address], [place], [place] [address]    Report Status PENDING  Incomplete  MRSA PCR Screening     Status: None   Collection Time: 10/26/17  1:00 AM  Result Value Ref Range Status   MRSA by PCR  NEGATIVE NEGATIVE Final    Comment:        The GeneXpert MRSA Assay (FDA approved for NASAL specimens only), is one component of a comprehensive MRSA colonization surveillance program. It is not intended to diagnose MRSA infection nor to guide or monitor treatment for MRSA infections.          Radiology Studies: Ct Abdomen Pelvis Wo Contrast  Result Date: 10/25/2017 CLINICAL DATA:  Unresolved pneumonia.  Pain. EXAM: CT CHEST, ABDOMEN AND PELVIS WITHOUT CONTRAST TECHNIQUE: Multidetector CT imaging of the chest, abdomen and pelvis was performed following the standard protocol without IV contrast. COMPARISON:  CT AP 01/30/2016 FINDINGS: CT CHEST FINDINGS Cardiovascular: Borderline cardiomegaly with native coronary arteriosclerosis. Status post aortic valvular replacement. Dense calcifications involving the mitral valve and annulus. No pericardial effusion. Status post CABG. Moderate atherosclerosis  of the thoracic aorta without aneurysm. Mild atherosclerosis of the right and left subclavian arteries. Mediastinum/Nodes: No lymphadenopathy. Patent trachea and mainstem bronchi. No significant esophageal mucosal thickening. Thyroid is not enlarged. Lungs/Pleura: Trace bilateral pleural effusions with subpleural pneumonic consolidations in the posterior aspect of the left lower lobe, posterior segment of right upper lobe and periphery of the right middle lobe. No dominant mass. Mild peribronchial thickening. Musculoskeletal: The status post median sternotomy. Mild thoracic spondylosis with multilevel disc space narrowing. No acute nor suspicious osseous abnormalities. CT ABDOMEN PELVIS FINDINGS Hepatobiliary: No focal liver abnormality is seen. Status post cholecystectomy. No biliary dilatation. Pancreas: Mild pancreatic atrophy. Spleen: No splenomegaly or mass given limitations of a noncontrast study. Adrenals/Urinary Tract: Normal bilateral adrenal glands. 14 mm exophytic hypodensity off the interpolar  left kidney likely to represent a small cyst and is unchanged in appearance though slightly more conspicuous. Punctate nonobstructing renal calculus is seen within the right kidney. No hydronephrosis. Urinary bladder is unremarkable. Stomach/Bowel: Nondistended stomach with normal small bowel rotation and ligament of Treitz. Normal appendix. Descending and sigmoid diverticulosis without acute diverticulitis. Vascular/Lymphatic: Moderate aortoiliac atherosclerosis with 3.3 cm infrarenal abdominal aortic aneurysm, stable in appearance. No lymphadenopathy. Reproductive:  Enlarged prostate Other: No free air free fluid. Subcutaneous soft tissue induration along the lower abdominal wall consistent with iatrogenic delivery of subcutaneous medication. Musculoskeletal: Lumbar spondylosis with multilevel degenerative disc disease. Left hip arthroplasty. No acute nor suspicious osseous abnormalities. IMPRESSION: 1. Status post CABG with borderline cardiomegaly.  Status post AVR. 2. Trace bilateral pleural effusions with subpleural pneumonic consolidations in the posterior segment of right upper lobe, left lower lobe and along the periphery of the right middle lobe. Associated mild peribronchial thickening that may represent superimposed bronchitic change. 3. Colonic diverticulosis without acute diverticulitis. 4. Stable fusiform 3.3 cm infrarenal abdominal aortic aneurysm. 5. Thoracolumbar spondylosis. 6. Status post cholecystectomy. 7. Stable cyst off the posterior aspect of the left kidney measuring 14 mm. Punctate nonobstructing right renal calculus. 8. Enlarged prostate, stable in appearance. Electronically Signed   By: Ashley Royalty M.D.   On: 10/25/2017 21:25   Dg Chest 2 View  Result Date: 10/25/2017 CLINICAL DATA:  Cough for 2 weeks, weakness, COPD, type II diabetes mellitus, stroke, former smoker, prior CABG EXAM: CHEST  2 VIEW COMPARISON:  10/09/2017 FINDINGS: Upper normal size of cardiac silhouette post CABG and  AVR. Atherosclerotic calcification aorta. Pulmonary vascularity normal. Mild atelectasis versus infiltrate at LEFT base. Remaining lungs clear. No acute infiltrate, pleural effusion or pneumothorax. Bones appear demineralized. IMPRESSION: Postsurgical changes of CABG and AVR. Mild LEFT basilar atelectasis versus infiltrate. Electronically Signed   By: Lavonia Dana M.D.   On: 10/25/2017 16:26   Ct Head Wo Contrast  Result Date: 10/25/2017 CLINICAL DATA:  Altered mental status, possible UTI, no pain EXAM: CT HEAD WITHOUT CONTRAST TECHNIQUE: Contiguous axial images were obtained from the base of the skull through the vertex without intravenous contrast. COMPARISON:  06/11/2017 FINDINGS: Brain: No evidence of acute infarction, hemorrhage, extra-axial collection, ventriculomegaly, or mass effect. Chronic right parietooccipital lobe infarct with encephalomalacia. Generalized cerebral atrophy. Periventricular white matter low attenuation likely secondary to microangiopathy. Vascular: Cerebrovascular atherosclerotic calcifications are noted. Skull: Negative for fracture or focal lesion. Sinuses/Orbits: Visualized portions of the orbits are unremarkable. Visualized portions of the paranasal sinuses and mastoid air cells are unremarkable. Other: None. IMPRESSION: 1. No acute intracranial pathology. Electronically Signed   By: Kathreen Devoid   On: 10/25/2017 16:33   Ct Chest Wo Contrast  Result Date: 10/25/2017 CLINICAL DATA:  Unresolved pneumonia.  Pain. EXAM: CT CHEST, ABDOMEN AND PELVIS WITHOUT CONTRAST TECHNIQUE: Multidetector CT imaging of the chest, abdomen and pelvis was performed following the standard protocol without IV contrast. COMPARISON:  CT AP 01/30/2016 FINDINGS: CT CHEST FINDINGS Cardiovascular: Borderline cardiomegaly with native coronary arteriosclerosis. Status post aortic valvular replacement. Dense calcifications involving the mitral valve and annulus. No pericardial effusion. Status post CABG.  Moderate atherosclerosis of the thoracic aorta without aneurysm. Mild atherosclerosis of the right and left subclavian arteries. Mediastinum/Nodes: No lymphadenopathy. Patent trachea and mainstem bronchi. No significant esophageal mucosal thickening. Thyroid is not enlarged. Lungs/Pleura: Trace bilateral pleural effusions with subpleural pneumonic consolidations in the posterior aspect of the left lower lobe, posterior segment of right upper lobe and periphery of the right middle lobe. No dominant mass. Mild peribronchial thickening. Musculoskeletal: The status post median sternotomy. Mild thoracic spondylosis with multilevel disc space narrowing. No acute nor suspicious osseous abnormalities. CT ABDOMEN PELVIS FINDINGS Hepatobiliary: No focal liver abnormality is seen. Status post cholecystectomy. No biliary dilatation. Pancreas: Mild pancreatic atrophy. Spleen: No splenomegaly or mass given limitations of a noncontrast study. Adrenals/Urinary Tract: Normal bilateral adrenal glands. 14 mm exophytic hypodensity off the interpolar left kidney likely to represent a small cyst and is unchanged in appearance though slightly more conspicuous. Punctate nonobstructing renal calculus is seen within the right kidney. No hydronephrosis. Urinary bladder is unremarkable. Stomach/Bowel: Nondistended stomach with normal small bowel rotation and ligament of Treitz. Normal appendix. Descending and sigmoid diverticulosis without acute diverticulitis. Vascular/Lymphatic: Moderate aortoiliac atherosclerosis with 3.3 cm infrarenal abdominal aortic aneurysm, stable in appearance. No lymphadenopathy. Reproductive:  Enlarged prostate Other: No free air free fluid. Subcutaneous soft tissue induration along the lower abdominal wall consistent with iatrogenic delivery of subcutaneous medication. Musculoskeletal: Lumbar spondylosis with multilevel degenerative disc disease. Left hip arthroplasty. No acute nor suspicious osseous abnormalities.  IMPRESSION: 1. Status post CABG with borderline cardiomegaly.  Status post AVR. 2. Trace bilateral pleural effusions with subpleural pneumonic consolidations in the posterior segment of right upper lobe, left lower lobe and along the periphery of the right middle lobe. Associated mild peribronchial thickening that may represent superimposed bronchitic change. 3. Colonic diverticulosis without acute diverticulitis. 4. Stable fusiform 3.3 cm infrarenal abdominal aortic aneurysm. 5. Thoracolumbar spondylosis. 6. Status post cholecystectomy. 7. Stable cyst off the posterior aspect of the left kidney measuring 14 mm. Punctate nonobstructing right renal calculus. 8. Enlarged prostate, stable in appearance. Electronically Signed   By: Ashley Royalty M.D.   On: 10/25/2017 21:25        Scheduled Meds: . apixaban  2.5 mg Oral BID  . atorvastatin  20 mg Oral q1800  . diltiazem  30 mg Oral BID  . famotidine  20 mg Oral Daily  . ferrous sulfate  325 mg Oral Q breakfast  . furosemide  20 mg Oral BID  . insulin aspart  0-15 Units Subcutaneous TID WC  . insulin aspart  0-5 Units Subcutaneous QHS  . insulin glargine  29 Units Subcutaneous QHS  . ondansetron (ZOFRAN) IV  4 mg Intravenous Once   Continuous Infusions: . azithromycin    . [START ON 10/27/2017] ceFEPime (MAXIPIME) IV       LOS: 1 day     Georgette Shell, MD [facility] If 7PM-7AM, please contact night-coverage [email or web address] Password [email or web address] 10/26/2017, 2:17 PM

## 2017-10-26 NOTE — Progress Notes (Signed)
Pharmacy Antibiotic Note  Eric [name] is a 84 y.o. male admitted on 10/25/2017 with pneumonia.  Pharmacy has been consulted for Vancomycin dosing.  Plan: Vancomycin 1gm iv x1, then 1250mg  iv q48hr  Goal AUC = 400 - 500 for all indications, except meningitis (goal AUC > 500 and Cmin 15-20 mcg/mL)   Height: 5\' 9"  (175.3 cm) Weight: 194 lb 0.1 oz (88 kg) IBW/kg (Calculated) : 70.7  Temp (24hrs), Avg:99 F (37.2 C), Min:98.4 F (36.9 C), Max:99.5 F (37.5 C)  Recent Labs  Lab 10/25/17 1640 10/25/17 1648 10/25/17 1819  WBC 10.4  --   --   CREATININE 1.98*  --   --   LATICACIDVEN  --  5.07* 4.85*    Estimated Creatinine Clearance: 28.3 mL/min (A) (by C-G formula based on SCr of 1.98 mg/dL (H)).    Allergies  Allergen Reactions  . Cyanocobalamin [Vitamin B12] Other (See Comments)    Leg swelling to gummy B12 vitamin  . Ampicillin Rash    Skin rash    Antimicrobials this admission: Ceftriaxone 10/25/2017  x1 Azithromycin 10/25/2017  >>  Vancomycin 10/25/2017 >> Cefepime 10/26/2017 >>  Dose adjustments this admission: -  Microbiology results: pending  Thank you for allowing pharmacy to be a part of this patient's care.  Nani Skillern Crowford 10/26/2017 4:01 AM

## 2017-10-26 NOTE — Progress Notes (Signed)
*  PRELIMINARY RESULTS* Echocardiogram 2D Echocardiogram has been performed.  Eric [name] 10/26/2017, 2:59 PM

## 2017-10-26 NOTE — Care Management Note (Signed)
Case Management Note  Patient Details  Name: FOXX KLARICH MRN: [id number] Date of Birth: 1927-01-18  Subjective/Objective:                  ams Action/Plan: Date:  October 26, 2017 Chart reviewed for concurrent status and case management needs.  Will continue to follow patient progress.  Discharge Planning: following for needs  Expected discharge date: October 29 2017 Velva Harman, BSN, [place], [place]   Expected Discharge Date:                  Expected Discharge Plan:  [place]  In-House Referral:  Clinical Social Work  Discharge planning Services  CM Consult  Post Acute Care Choice:    Choice offered to:     DME Arranged:    DME Agency:     HH Arranged:    [place] Agency:     Status of Service:  In process, will continue to follow  If discussed at Long Length of Stay Meetings, dates discussed:    Additional Comments:  Leeroy Cha, RN 10/26/2017, 9:28 AM

## 2017-10-26 NOTE — Progress Notes (Signed)
PHARMACY NOTE:  ANTIMICROBIAL RENAL DOSAGE ADJUSTMENT  Current antimicrobial regimen includes a mismatch between antimicrobial dosage and estimated renal function.  As per policy approved by the Pharmacy & Therapeutics and Medical Executive Committees, the antimicrobial dosage will be adjusted accordingly.  Current antimicrobial dosage:  Cefepime 2g IV q12h  Indication: HCAP  Renal Function:  Estimated Creatinine Clearance: 30.6 mL/min (A) (by C-G formula based on SCr of 1.83 mg/dL (H)). []      On intermittent HD, scheduled: []      On CRRT    Antimicrobial dosage has been changed to:  Cefepime 1g IV q12h to keep max 2g/24 hour for CrCl 30-50 ml/min   Thank you for allowing pharmacy to be a part of this patient's care.  Hershal Coria, [facility] 10/26/2017 9:36 AM

## 2017-10-26 NOTE — Progress Notes (Signed)
Inpatient Diabetes Program Recommendations  AACE/ADA: New Consensus Statement on Inpatient Glycemic Control (2015)  Target Ranges:  Prepandial:   less than 140 mg/dL      Peak postprandial:   less than 180 mg/dL (1-2 hours)      Critically ill patients:  140 - 180 mg/dL   Lab Results  Component Value Date   GLUCAP 264 (H) 10/26/2017   HGBA1C 7.9 (H) 02/12/2017    Review of Glycemic Control  Diabetes history: DM2 Outpatient Diabetes medications: Lantus 29 units QHS, glipizide 10 mg QAM (not taking) Current orders for Inpatient glycemic control: Lantus 29 units QHS, Novolog 0-15 units tidwc and hs  HgbA1C indicates pretty good control at home.   Inpatient Diabetes Program Recommendations:    Increase Lantus to 32 units QHS If po intake > 50%, add Novolog 3 units tidwc for meal coverage insulin.  Continue to follow while inpatient.   Thank you. Lorenda Peck, RD, LDN, CDE Inpatient Diabetes Coordinator [phone]

## 2017-10-27 LAB — GLUCOSE, CAPILLARY
GLUCOSE-CAPILLARY: 118 mg/dL — AB (ref 65–99)
Glucose-Capillary: 118 mg/dL — ABNORMAL HIGH (ref 65–99)
Glucose-Capillary: 164 mg/dL — ABNORMAL HIGH (ref 65–99)
Glucose-Capillary: 56 mg/dL — ABNORMAL LOW (ref 65–99)
Glucose-Capillary: 67 mg/dL (ref 65–99)

## 2017-10-27 LAB — CBC
HCT: 33.3 % — ABNORMAL LOW (ref 39.0–52.0)
HEMOGLOBIN: 10.5 g/dL — AB (ref 13.0–17.0)
MCH: 30.7 pg (ref 26.0–34.0)
MCHC: 31.5 g/dL (ref 30.0–36.0)
MCV: 97.4 fL (ref 78.0–100.0)
PLATELETS: 307 10*3/uL (ref 150–400)
RBC: 3.42 MIL/uL — ABNORMAL LOW (ref 4.22–5.81)
RDW: 14.4 % (ref 11.5–15.5)
WBC: 6.8 10*3/uL (ref 4.0–10.5)

## 2017-10-27 LAB — BASIC METABOLIC PANEL
Anion gap: 5 (ref 5–15)
BUN: 16 mg/dL (ref 6–20)
CALCIUM: 8.1 mg/dL — AB (ref 8.9–10.3)
CO2: 25 mmol/L (ref 22–32)
CREATININE: 1.48 mg/dL — AB (ref 0.61–1.24)
Chloride: 109 mmol/L (ref 101–111)
GFR calc Af Amer: 47 mL/min — ABNORMAL LOW (ref >60)
GFR, EST NON AFRICAN AMERICAN: 40 mL/min — AB (ref >60)
GLUCOSE: 167 mg/dL — AB (ref 65–99)
Potassium: 3.6 mmol/L (ref 3.5–5.1)
Sodium: 139 mmol/L (ref 135–145)

## 2017-10-27 MED ORDER — ALBUTEROL SULFATE (2.5 MG/3ML) 0.083% IN NEBU: 2.5000 mg | INHALATION_SOLUTION | RESPIRATORY_TRACT | Status: DC | PRN

## 2017-10-27 MED ORDER — PREMIER PROTEIN SHAKE
11.0000 [oz_av] | ORAL | Status: DC
  Administered 2017-10-28 – 2017-10-30 (×2): 11 [oz_av] via ORAL
  Filled 2017-10-27 (×4): qty 325.31

## 2017-10-27 NOTE — Progress Notes (Signed)
Occupational Therapy Evaluation Patient Details Name: Eric [name] MRN: [id number] DOB: 30-May-1927 Today's Date: 10/27/2017    History of Present Illness    84 y.o. male, w dm2, hypertension, hyperlipidemia, CAD s/p CABG, h/o endocarditis, h/o AVR, Copd, stroke with right residual, recent stay in  [facility] psych unit for dementia and SI, and now transitioned to home, and was brought 1/06/ in tonight for generalized weakness, and some confusion,  found to have CAP.   Clinical Impression   PTA, pt was living at home with his son and states he was using a RW for mobility and completed his own bathing and dressing with "a little help" from his son. Pt currently max A with mobility and ADL. Pt currently having difficulty with self feeding and may benefit form AE to assist with feeding. Recommend rehab at SNF to maximize functional level of independence. Will follow acutely to address goals.     Follow Up Recommendations  SNF;Supervision/Assistance - 24 hour    Equipment Recommendations  Other (comment)(TBA)    Recommendations for Other Services       Precautions / Restrictions Precautions Precautions: Fall Precaution Comments: monitor sats, right side weaker Restrictions Weight Bearing Restrictions: No      Mobility Bed Mobility Overal bed mobility: Needs Assistance Bed Mobility: Rolling;Supine to Sit;Sit to Supine Rolling: Max assist   Supine to sit: Max assist;HOB elevated Sit to supine: Total assist   General bed mobility comments: the patient is quite stiff in the legs and trunk, requires assist for sitting up and going back to supine.   Transfers Overall transfer level: Needs assistance Equipment used: Rolling walker (2 wheeled) Transfers: Sit to/from Stand          General transfer comment: +2 Assist needed; not attmepted at this time    Balance Overall balance assessment: Needs assistance;History of Falls Sitting-balance support: Bilateral upper extremity  supported;Feet supported Sitting balance-Leahy Scale: Poor Sitting balance - Comments: gradually able to self support by holding toe RW    Standing balance support: During functional activity;Bilateral upper extremity supported Standing balance-Leahy Scale: Poor Standing balance comment: tendency to lean backward.                           ADL either performed or assessed with clinical judgement   ADL Overall ADL's : Needs assistance/impaired     Grooming: Maximal assistance;Bed level   Upper Body Bathing: Moderate assistance;Bed level   Lower Body Bathing: Maximal assistance;Bed level   Upper Body Dressing : Moderate assistance;Bed level   Lower Body Dressing: Maximal assistance;Bed level               Functional mobility during ADLs: Maximal assistance;+2 for safety/equipment General ADL Comments: Pt has difficulty grasping utnesils; may benefit form built up tubing and sippy cup. Believe pt has built up utnesils at home?     Vision         Perception     Praxis      Pertinent Vitals/Pain Pain Assessment: Faces Faces Pain Scale: Hurts little more Pain Location: right hand, periarea( edema) Pain Descriptors / Indicators: Discomfort;Grimacing;Guarding Pain Intervention(s): Limited activity within patient's tolerance     Hand Dominance Left(stroke affected R)   Extremity/Trunk Assessment Upper Extremity Assessment Upper Extremity Assessment: RUE deficits/detail;LUE deficits/detail RUE Deficits / Details: residual affects from CVA. Uses as a functional assist LUE Deficits / Details: generalized weakness; able to touch hand to mouth and top of head  but has difficulty sefl feedingDifficulty making full fist LUE Coordination: decreased fine motor;decreased gross motor   Lower Extremity Assessment Lower Extremity Assessment: RLE deficits/detail;LLE deficits/detail RLE Deficits / Details: knee flexion at about 90, decreased extension length, bears  weight in standing with RW LLE Deficits / Details: knee flexion ~ 90 *, bears weight to stand and step sideways       Communication Communication Communication: HOH   Cognition Arousal/Alertness: Awake/alert Behavior During Therapy: WFL for tasks assessed/performed Overall Cognitive Status: No family/caregiver present to determine baseline cognitive functioning                                     General Comments       Exercises General Exercises - Upper Extremity Shoulder Flexion: AAROM;Both;10 reps General Exercises - Lower Extremity Heel Slides: AAROM;Both;10 reps Straight Leg Raises: AAROM;Both;10 reps   Shoulder Instructions      Home Living Family/patient expects to be discharged to:: Skilled nursing facility Living Arrangements: Children Available Help at Discharge: Family Type of Home: House Home Access: Stairs to enter [employer] of Steps: 2   Home Layout: One level               Home Equipment: Blair - 2 wheels;Cane - single point          Prior Functioning/Environment Level of Independence: Needs assistance  Gait / Transfers Assistance Needed: states son assists with meals and B/D, uses a cane or RW. ADL's / Homemaking Assistance Needed: states he was bathing himself with assistance for his back only; could feed himself Communication / Swallowing Assistance Needed: HOH          OT Problem List: Decreased strength;Decreased range of motion;Decreased activity tolerance;Impaired balance (sitting and/or standing);Decreased coordination;Decreased cognition;Decreased safety awareness;Cardiopulmonary status limiting activity;Obesity;Pain      OT Treatment/Interventions: Self-care/ADL training;Therapeutic exercise;Energy conservation;DME and/or AE instruction;Therapeutic activities;Cognitive remediation/compensation;Patient/family education;Balance training    OT Goals(Current goals can be found in the care plan section)  Acute Rehab OT Goals Patient Stated Goal: to get better OT Goal Formulation: With patient Time For Goal Achievement: 11/10/17 Potential to Achieve Goals: Good  OT Frequency: Min 2X/week   Barriers to D/C:            Co-evaluation              AM-PAC PT "6 Clicks" Daily Activity     Outcome Measure Help from another person eating meals?: A Lot Help from another person taking care of personal grooming?: A Lot Help from another person toileting, which includes using toliet, bedpan, or urinal?: Total Help from another person bathing (including washing, rinsing, drying)?: A Lot Help from another person to put on and taking off regular upper body clothing?: A Lot Help from another person to put on and taking off regular lower body clothing?: A Lot 6 Click Score: 11   End of Session Equipment Utilized During Treatment: Oxygen Nurse Communication: Mobility status  Activity Tolerance: Patient limited by fatigue Patient left: in bed;with call bell/phone within reach;with bed alarm set;with SCD's reapplied  OT Visit Diagnosis: Other abnormalities of gait and mobility (R26.89);Muscle weakness (generalized) (M62.81);Other symptoms and signs involving cognitive function;Pain Pain - part of body: (general discomfort)                Time: 1445-1501 OT Time Calculation (min): 16 min Charges:  OT General Charges $OT Visit: 1 Visit OT  Evaluation $OT Eval Moderate Complexity: 1 Mod G-Codes:     [facility], OT/L  [phone] 10/27/2017  Saraya Tirey,HILLARY 10/27/2017, 3:32 PM

## 2017-10-27 NOTE — Evaluation (Signed)
Physical Therapy Evaluation Patient Details Name: Eric [name] MRN: [id number] DOB: [birth year] Today's Date: 10/27/2017   History of Present Illness     84 y.o. male, w dm2, hypertension, hyperlipidemia, CAD s/p CABG, h/o endocarditis, h/o AVR, Copd, stroke with right residual, recent stay in  [place] psych unit for dementia and SI, and now transitioned to home, and was brought 1/06/ for generalized weakness, and some confusion,  found to have CAP.  Clinical Impression  The patient is very deconditioned today, requires 2 assist for bed mobility, did stand with RW and 2 max assist from bed. Pt admitted with above diagnosis. Pt currently with functional limitations due to the deficits listed below (see PT Problem List). Pt will benefit from skilled PT to increase their independence and safety with mobility to allow discharge to the venue listed below.       Follow Up Recommendations SNF    Equipment Recommendations  None recommended by PT    Recommendations for Other Services       Precautions / Restrictions Precautions Precautions: Fall Precaution Comments: monitor sats, right side weaker      Mobility  Bed Mobility Overal bed mobility: Needs Assistance Bed Mobility: Rolling;Supine to Sit;Sit to Supine Rolling: Max assist   Supine to sit: Max assist;HOB elevated Sit to supine: Total assist;+2 for physical assistance;+2 for safety/equipment   General bed mobility comments: the patient is quite stiff in the legs and trunk, requires assist for sitting up and going back to supine.   Transfers Overall transfer level: Needs assistance Equipment used: Rolling walker (2 wheeled) Transfers: Sit to/from Stand Sit to Stand: Mod assist;+2 physical assistance;+2 safety/equipment;From elevated surface         General transfer comment: at Rw, assist to power up to stand from bed. took 4 small sidesteps along the bed requiring 2 assist for balance. Much assist to descend with difficulty  sitting forward after sitting down  and began to slide off of the bed edge.  Ambulation/Gait                Stairs            Wheelchair Mobility    Modified Rankin (Stroke Patients Only)       Balance Overall balance assessment: Needs assistance;History of Falls Sitting-balance support: Bilateral upper extremity supported;Feet supported Sitting balance-Leahy Scale: Poor Sitting balance - Comments: gradually able to self support by holding toe RW    Standing balance support: During functional activity;Bilateral upper extremity supported Standing balance-Leahy Scale: Poor Standing balance comment: tendency to lean backward.                             Pertinent Vitals/Pain Pain Assessment: Faces Faces Pain Scale: Hurts little more Pain Location: right hand, periarea( edema) Pain Descriptors / Indicators: Discomfort;Grimacing;Guarding Pain Intervention(s): Repositioned;Monitored during session    Home Living Family/patient expects to be discharged to:: Private residence Living Arrangements: Children Available Help at Discharge: Family Type of Home: House Home Access: Stairs to enter   Technical brewer of Steps: 2 Home Layout: One level Home Equipment: Environmental consultant - 2 wheels;Cane - single point      Prior Function Level of Independence: Needs assistance   Gait / Transfers Assistance Needed: states son assists with meals and B/D, uses a cane or RW.           Hand Dominance        Extremity/Trunk Assessment  Upper Extremity Assessment Upper Extremity Assessment: Defer to OT evaluation    Lower Extremity Assessment Lower Extremity Assessment: RLE deficits/detail;LLE deficits/detail RLE Deficits / Details: knee flexion at about 90, decreased extension length, bears weight in standing with RW LLE Deficits / Details: knee flexion ~ 90 *, bears weight to stand and step sideways       Communication      Cognition Arousal/Alertness:  Awake/alert Behavior During Therapy: WFL for tasks assessed/performed Overall Cognitive Status:  oriented to WL and day of week, gives information about recent mobility and being in facility in [place] and getting PNA. No family present.                                    General Comments      Exercises General Exercises - Upper Extremity Shoulder Flexion: AAROM;Both;10 reps General Exercises - Lower Extremity Heel Slides: AAROM;Both;10 reps Straight Leg Raises: AAROM;Both;10 reps   Assessment/Plan    PT Assessment Patient needs continued PT services  PT Problem List Decreased strength;Decreased range of motion;Decreased activity tolerance;Decreased balance;Decreased mobility;Decreased knowledge of precautions;Decreased safety awareness;Decreased knowledge of use of DME;Pain       PT Treatment Interventions DME instruction;Gait training;Functional mobility training;Therapeutic activities;Patient/family education;Balance training;Therapeutic exercise    PT Goals (Current goals can be found in the Care Plan section)  Acute Rehab PT Goals Patient Stated Goal: none stated PT Goal Formulation: With patient Time For Goal Achievement: 11/10/17 Potential to Achieve Goals: Fair    Frequency Min 2X/week   Barriers to discharge Decreased caregiver support      Co-evaluation               AM-PAC PT "6 Clicks" Daily Activity  Outcome Measure Difficulty turning over in bed (including adjusting bedclothes, sheets and blankets)?: Unable Difficulty moving from lying on back to sitting on the side of the bed? : Unable Difficulty sitting down on and standing up from a chair with arms (e.g., wheelchair, bedside commode, etc,.)?: Unable Help needed moving to and from a bed to chair (including a wheelchair)?: Total Help needed walking in hospital room?: Total Help needed climbing 3-5 steps with a railing? : Total 6 Click Score: 6    End of Session Equipment Utilized During  Treatment: Gait belt Activity Tolerance: Patient tolerated treatment well Patient left: in bed;with call bell/phone within reach;with bed alarm set Nurse Communication: Mobility status PT Visit Diagnosis: Difficulty in walking, not elsewhere classified (R26.2)    Time: 7939-0300 PT Time Calculation (min) (ACUTE ONLY): 55 min   Charges:   PT Evaluation $PT Eval Moderate Complexity: 1 Mod PT Treatments $Therapeutic Exercise: 8-22 mins $Therapeutic Activity: 23-37 mins   PT G CodesTresa Endo PT [phone]  Claretha Cooper 10/27/2017, 1:02 PM

## 2017-10-27 NOTE — Progress Notes (Signed)
PROGRESS NOTE    Eric [name]  TDD:[id number] DOB: 14-May-1927 DOA: 10/25/2017 PCP: [name], MD  Brief Narrative: 84 y.o.male,w dm2, hypertension, hyperlipidemia, CAD s/p CABG, h/o endocarditis, h/o AVR, Copd, apparently was recent at [facility] psych unit for dementia and now transitioned to home, and was brought in tonight for generalized weakness, and some confusion. EMS apparently noted some wheezing and gave albuterol and atrovent. Pt has been coughing .  In ED,  CT brain IMPRESSION: 1. No acute intracranial pathology.  CXR IMPRESSION: Postsurgical changes of CABG and AVR.  Mild LEFT basilar atelectasis versus infiltrate.  Na 137, K 3.7 Bun 22, Creatinine 1.98  Ast 21, Alt 9 Glucose 271 Wbc 10.4, Hgb 12.8, Plt 363  Trop 0.03  Urinalysis , prot 30, wbc 0-5  La 4.85  Pt will be admitted for ? Sepsis (tachycardia, elevation in lactic acid, CXR=> infiltrate), and Hcap  10/27/2017 patient reports he is feeling better.  He is much more awake and alert.  His breathing is better.  His blood pressure has improved on IV hydration.  Assessment & Plan:   Principal Problem:   Pneumonia Active Problems:   Diabetic neuropathy associated with type 2 diabetes mellitus ([name]   Atrial fibrillation with RVR ([name]   Acute renal failure (ARF) ([name]   Tachycardia   Anemia   HCAP (healthcare-associated pneumonia)  1]LLL Pneumonia/HCAP/SEPSIS Patient is on maxipime and azithro.received rocephin in er.mrsa pcr negative.will dc vanco.  His chest x-ray showed left basilar infiltrate.start svn treatments. 2]afib rvr resolved.continue cardizem and eliquis. 3]dm-continue lantus and ssi.  Lantus dose increased yesterday. 4]acute on chronic ckd-slow ivf.monitor daily creatinine is better with IV hydration.dc ivf. 5]diastolic chf stable.ef 55%.hold lasix for now.slow iv hydration.restatrt lasix when appropriate. 6]hyperlipedemia-continue lipitor. 7]dementia on  aricept      DVT prophylaxis: Lovenox Code Status: Full code Family Communication: Discussed with daughter today over the phone Disposition Plan: I have placed a PT and OT eval the family would like him to go to a skilled nursing facility before he can be discharged home. Consultants:  None Procedures: None Antimicrobials: Maxipime and azithromycin Subjective: Much more awake and alert feels better  Objective: Vitals:   10/27/17 0700 10/27/17 0800 10/27/17 0900 10/27/17 1000  BP: 107/76 104/62 (!) 94/50 114/63  Pulse: 85 69 61 64  Resp: 20 14 15 11   Temp:  97.6 F (36.4 C)    TempSrc:  Oral    SpO2: 100% 100% 98% 100%  Weight:      Height:        Intake/Output Summary (Last 24 hours) at 10/27/2017 1236 Last data filed at 10/27/2017 1015 Gross per 24 hour  Intake 2475 ml  Output 550 ml  Net 1925 ml   Filed Weights   10/26/17 0115  Weight: 88 kg (194 lb 0.1 oz)    Examination:  General exam: Appears calm and comfortable  Respiratory system: wheezing and rhonchi auscultation. Respiratory effort normal. Cardiovascular system: S1 & S2 heard, RRR. No JVD, murmurs, rubs, gallops or clicks. No pedal edema. Gastrointestinal system: Abdomen is nondistended, soft and nontender. No organomegaly or masses felt. Normal bowel sounds heard. Central nervous system: Alert and oriented. No focal neurological deficits. Extremities: Symmetric 5 x 5 power. Skin: No rashes, lesions or ulcers Psychiatry: Judgement and insight appear normal. Mood & affect appropriate.     Data Reviewed: I have personally reviewed following labs and imaging studies  CBC: Recent Labs  Lab 10/25/17 1640 10/26/17 0750 10/27/17 0303  WBC 10.4 7.6 6.8  NEUTROABS 7.9*  --   --   HGB 12.8* 10.5* 10.5*  HCT 39.5 32.4* 33.3*  MCV 95.9 96.1 97.4  PLT 363 298 893   Basic Metabolic Panel: Recent Labs  Lab 10/25/17 1640 10/26/17 0750 10/27/17 0303  NA 137 137 139  K 3.7 4.6 3.6  CL 98* 106 109   CO2 22 24 25   GLUCOSE 271* 300* 167*  BUN 22* 23* 16  CREATININE 1.98* 1.83* 1.48*  CALCIUM 9.1 8.0* 8.1*   GFR: Estimated Creatinine Clearance: 37.9 mL/min (A) (by C-G formula based on SCr of 1.48 mg/dL (H)). Liver Function Tests: Recent Labs  Lab 10/25/17 1640 10/26/17 0750  AST 21 14*  ALT 9* 8*  ALKPHOS 81 66  BILITOT 1.2 0.6  PROT 7.8 6.4*  ALBUMIN 3.3* 2.6*   No results for input(s): LIPASE, AMYLASE in the last 168 hours. No results for input(s): AMMONIA in the last 168 hours. Coagulation Profile: No results for input(s): INR, PROTIME in the last 168 hours. Cardiac Enzymes: Recent Labs  Lab 10/26/17 0215 10/26/17 1332 10/26/17 1813  TROPONINI <0.03 0.03* <0.03   BNP (last 3 results) No results for input(s): PROBNP in the last 8760 hours. HbA1C: No results for input(s): HGBA1C in the last 72 hours. CBG: Recent Labs  Lab 10/26/17 0820 10/26/17 1207 10/26/17 1652 10/26/17 2252 10/27/17 0748  GLUCAP 261* 264* 96 168* 118*   Lipid Profile: No results for input(s): CHOL, HDL, LDLCALC, TRIG, CHOLHDL, LDLDIRECT in the last 72 hours. Thyroid Function Tests: Recent Labs    10/26/17 0215  TSH 1.046   Anemia Panel: No results for input(s): VITAMINB12, FOLATE, FERRITIN, TIBC, IRON, RETICCTPCT in the last 72 hours. Sepsis Labs: Recent Labs  Lab 10/25/17 1648 10/25/17 1819 10/26/17 1044  LATICACIDVEN 5.07* 4.85* 1.0    Recent Results (from the past 240 hour(s))  Blood culture (routine x 2)     Status: None (Preliminary result)   Collection Time: 10/25/17  4:34 PM  Result Value Ref Range Status   Specimen Description BLOOD RIGHT ANTECUBITAL  Final   Special Requests   Final    BOTTLES DRAWN AEROBIC AND ANAEROBIC Blood Culture adequate volume   Culture   Final    NO GROWTH 2 DAYS Performed at [facility], [address] [place], [place] [address]    Report Status PENDING  Incomplete  Blood culture (routine x 2)     Status: None (Preliminary  result)   Collection Time: 10/25/17  4:40 PM  Result Value Ref Range Status   Specimen Description BLOOD LEFT ANTECUBITAL  Final   Special Requests   Final    BOTTLES DRAWN AEROBIC AND ANAEROBIC Blood Culture adequate volume   Culture   Final    NO GROWTH 2 DAYS Performed at [facility], [place] [address], [place], [place] [address]    Report Status PENDING  Incomplete  Urine culture     Status: None   Collection Time: 10/25/17  4:50 PM  Result Value Ref Range Status   Specimen Description URINE, CATHETERIZED  Final   Special Requests NONE  Final   Culture   Final    NO GROWTH Performed at [facility], [place] [address], [place], [place] [address]    Report Status 10/26/2017 FINAL  Final  MRSA PCR Screening     Status: None   Collection Time: 10/26/17  1:00 AM  Result Value Ref Range Status  MRSA by PCR NEGATIVE NEGATIVE Final    Comment:        The GeneXpert MRSA Assay (FDA approved for NASAL specimens only), is one component of a comprehensive MRSA colonization surveillance program. It is not intended to diagnose MRSA infection nor to guide or monitor treatment for MRSA infections.   Culture, blood (routine x 2) Call MD if unable to obtain prior to antibiotics being given     Status: None (Preliminary result)   Collection Time: 10/26/17  2:15 AM  Result Value Ref Range Status   Specimen Description BLOOD RIGHT HAND  Final   Special Requests IN PEDIATRIC BOTTLE Blood Culture adequate volume  Final   Culture   Final    NO GROWTH 1 DAY Performed at [facility], [address] [place], [place] [address]    Report Status PENDING  Incomplete  Culture, blood (routine x 2) Call MD if unable to obtain prior to antibiotics being given     Status: None (Preliminary result)   Collection Time: 10/26/17  2:15 AM  Result Value Ref Range Status   Specimen Description BLOOD LEFT HAND  Final   Special Requests   Final    IN PEDIATRIC BOTTLE Blood Culture results  may not be optimal due to an excessive volume of blood received in culture bottles   Culture   Final    NO GROWTH 1 DAY Performed at [facility], [place] [address], [place], [place] [address]    Report Status PENDING  Incomplete         Radiology Studies: Ct Abdomen Pelvis Wo Contrast  Result Date: 10/25/2017 CLINICAL DATA:  Unresolved pneumonia.  Pain. EXAM: CT CHEST, ABDOMEN AND PELVIS WITHOUT CONTRAST TECHNIQUE: Multidetector CT imaging of the chest, abdomen and pelvis was performed following the standard protocol without IV contrast. COMPARISON:  CT AP 01/30/2016 FINDINGS: CT CHEST FINDINGS Cardiovascular: Borderline cardiomegaly with native coronary arteriosclerosis. Status post aortic valvular replacement. Dense calcifications involving the mitral valve and annulus. No pericardial effusion. Status post CABG. Moderate atherosclerosis of the thoracic aorta without aneurysm. Mild atherosclerosis of the right and left subclavian arteries. Mediastinum/Nodes: No lymphadenopathy. Patent trachea and mainstem bronchi. No significant esophageal mucosal thickening. Thyroid is not enlarged. Lungs/Pleura: Trace bilateral pleural effusions with subpleural pneumonic consolidations in the posterior aspect of the left lower lobe, posterior segment of right upper lobe and periphery of the right middle lobe. No dominant mass. Mild peribronchial thickening. Musculoskeletal: The status post median sternotomy. Mild thoracic spondylosis with multilevel disc space narrowing. No acute nor suspicious osseous abnormalities. CT ABDOMEN PELVIS FINDINGS Hepatobiliary: No focal liver abnormality is seen. Status post cholecystectomy. No biliary dilatation. Pancreas: Mild pancreatic atrophy. Spleen: No splenomegaly or mass given limitations of a noncontrast study. Adrenals/Urinary Tract: Normal bilateral adrenal glands. 14 mm exophytic hypodensity off the interpolar left kidney likely to represent a small cyst and is  unchanged in appearance though slightly more conspicuous. Punctate nonobstructing renal calculus is seen within the right kidney. No hydronephrosis. Urinary bladder is unremarkable. Stomach/Bowel: Nondistended stomach with normal small bowel rotation and ligament of Treitz. Normal appendix. Descending and sigmoid diverticulosis without acute diverticulitis. Vascular/Lymphatic: Moderate aortoiliac atherosclerosis with 3.3 cm infrarenal abdominal aortic aneurysm, stable in appearance. No lymphadenopathy. Reproductive:  Enlarged prostate Other: No free air free fluid. Subcutaneous soft tissue induration along the lower abdominal wall consistent with iatrogenic delivery of subcutaneous medication. Musculoskeletal: Lumbar spondylosis with multilevel degenerative disc disease. Left hip arthroplasty. No acute nor suspicious  osseous abnormalities. IMPRESSION: 1. Status post CABG with borderline cardiomegaly.  Status post AVR. 2. Trace bilateral pleural effusions with subpleural pneumonic consolidations in the posterior segment of right upper lobe, left lower lobe and along the periphery of the right middle lobe. Associated mild peribronchial thickening that may represent superimposed bronchitic change. 3. Colonic diverticulosis without acute diverticulitis. 4. Stable fusiform 3.3 cm infrarenal abdominal aortic aneurysm. 5. Thoracolumbar spondylosis. 6. Status post cholecystectomy. 7. Stable cyst off the posterior aspect of the left kidney measuring 14 mm. Punctate nonobstructing right renal calculus. 8. Enlarged prostate, stable in appearance. Electronically Signed   By: Ashley Royalty M.D.   On: 10/25/2017 21:25   Dg Chest 2 View  Result Date: 10/25/2017 CLINICAL DATA:  Cough for 2 weeks, weakness, COPD, type II diabetes mellitus, stroke, former smoker, prior CABG EXAM: CHEST  2 VIEW COMPARISON:  10/09/2017 FINDINGS: Upper normal size of cardiac silhouette post CABG and AVR. Atherosclerotic calcification aorta. Pulmonary  vascularity normal. Mild atelectasis versus infiltrate at LEFT base. Remaining lungs clear. No acute infiltrate, pleural effusion or pneumothorax. Bones appear demineralized. IMPRESSION: Postsurgical changes of CABG and AVR. Mild LEFT basilar atelectasis versus infiltrate. Electronically Signed   By: Lavonia Dana M.D.   On: 10/25/2017 16:26   Ct Head Wo Contrast  Result Date: 10/25/2017 CLINICAL DATA:  Altered mental status, possible UTI, no pain EXAM: CT HEAD WITHOUT CONTRAST TECHNIQUE: Contiguous axial images were obtained from the base of the skull through the vertex without intravenous contrast. COMPARISON:  06/11/2017 FINDINGS: Brain: No evidence of acute infarction, hemorrhage, extra-axial collection, ventriculomegaly, or mass effect. Chronic right parietooccipital lobe infarct with encephalomalacia. Generalized cerebral atrophy. Periventricular white matter low attenuation likely secondary to microangiopathy. Vascular: Cerebrovascular atherosclerotic calcifications are noted. Skull: Negative for fracture or focal lesion. Sinuses/Orbits: Visualized portions of the orbits are unremarkable. Visualized portions of the paranasal sinuses and mastoid air cells are unremarkable. Other: None. IMPRESSION: 1. No acute intracranial pathology. Electronically Signed   By: Kathreen Devoid   On: 10/25/2017 16:33   Ct Chest Wo Contrast  Result Date: 10/25/2017 CLINICAL DATA:  Unresolved pneumonia.  Pain. EXAM: CT CHEST, ABDOMEN AND PELVIS WITHOUT CONTRAST TECHNIQUE: Multidetector CT imaging of the chest, abdomen and pelvis was performed following the standard protocol without IV contrast. COMPARISON:  CT AP 01/30/2016 FINDINGS: CT CHEST FINDINGS Cardiovascular: Borderline cardiomegaly with native coronary arteriosclerosis. Status post aortic valvular replacement. Dense calcifications involving the mitral valve and annulus. No pericardial effusion. Status post CABG. Moderate atherosclerosis of the thoracic aorta without  aneurysm. Mild atherosclerosis of the right and left subclavian arteries. Mediastinum/Nodes: No lymphadenopathy. Patent trachea and mainstem bronchi. No significant esophageal mucosal thickening. Thyroid is not enlarged. Lungs/Pleura: Trace bilateral pleural effusions with subpleural pneumonic consolidations in the posterior aspect of the left lower lobe, posterior segment of right upper lobe and periphery of the right middle lobe. No dominant mass. Mild peribronchial thickening. Musculoskeletal: The status post median sternotomy. Mild thoracic spondylosis with multilevel disc space narrowing. No acute nor suspicious osseous abnormalities. CT ABDOMEN PELVIS FINDINGS Hepatobiliary: No focal liver abnormality is seen. Status post cholecystectomy. No biliary dilatation. Pancreas: Mild pancreatic atrophy. Spleen: No splenomegaly or mass given limitations of a noncontrast study. Adrenals/Urinary Tract: Normal bilateral adrenal glands. 14 mm exophytic hypodensity off the interpolar left kidney likely to represent a small cyst and is unchanged in appearance though slightly more conspicuous. Punctate nonobstructing renal calculus is seen within the right kidney. No hydronephrosis. Urinary bladder is unremarkable. Stomach/Bowel: Nondistended stomach  with normal small bowel rotation and ligament of Treitz. Normal appendix. Descending and sigmoid diverticulosis without acute diverticulitis. Vascular/Lymphatic: Moderate aortoiliac atherosclerosis with 3.3 cm infrarenal abdominal aortic aneurysm, stable in appearance. No lymphadenopathy. Reproductive:  Enlarged prostate Other: No free air free fluid. Subcutaneous soft tissue induration along the lower abdominal wall consistent with iatrogenic delivery of subcutaneous medication. Musculoskeletal: Lumbar spondylosis with multilevel degenerative disc disease. Left hip arthroplasty. No acute nor suspicious osseous abnormalities. IMPRESSION: 1. Status post CABG with borderline  cardiomegaly.  Status post AVR. 2. Trace bilateral pleural effusions with subpleural pneumonic consolidations in the posterior segment of right upper lobe, left lower lobe and along the periphery of the right middle lobe. Associated mild peribronchial thickening that may represent superimposed bronchitic change. 3. Colonic diverticulosis without acute diverticulitis. 4. Stable fusiform 3.3 cm infrarenal abdominal aortic aneurysm. 5. Thoracolumbar spondylosis. 6. Status post cholecystectomy. 7. Stable cyst off the posterior aspect of the left kidney measuring 14 mm. Punctate nonobstructing right renal calculus. 8. Enlarged prostate, stable in appearance. Electronically Signed   By: Ashley Royalty M.D.   On: 10/25/2017 21:25        Scheduled Meds: . apixaban  2.5 mg Oral BID  . atorvastatin  20 mg Oral q1800  . diltiazem  30 mg Oral BID  . famotidine  20 mg Oral Daily  . ferrous sulfate  325 mg Oral Q breakfast  . insulin aspart  0-15 Units Subcutaneous TID WC  . insulin aspart  0-5 Units Subcutaneous QHS  . insulin glargine  32 Units Subcutaneous QHS  . ondansetron (ZOFRAN) IV  4 mg Intravenous Once  . protein supplement shake  11 oz Oral Q24H   Continuous Infusions: . sodium chloride Stopped (10/27/17 0838)  . azithromycin 500 mg (10/26/17 1738)  . ceFEPime (MAXIPIME) IV Stopped (10/27/17 1122)     LOS: 2 days       Georgette Shell, MD [facility]  If 7PM-7AM, please contact night-coverage [email or web address] Password [facility] 10/27/2017, 12:36 PM

## 2017-10-27 NOTE — Progress Notes (Signed)
Initial Nutrition Assessment  DOCUMENTATION CODES:   Not applicable  INTERVENTION:  - Will order Premier Protein once/day, this supplement provides 160 kcal and 30 grams of protein.  - Continue to encourage PO intakes.   NUTRITION DIAGNOSIS:   Inadequate oral intake related to acute illness(pneumonia) as evidenced by meal completion < 50%.  GOAL:   Patient will meet greater than or equal to 90% of their needs  MONITOR:   PO intake, Supplement acceptance, Weight trends, Labs  REASON FOR ASSESSMENT:   Malnutrition Screening Tool  ASSESSMENT:   84 y.o. male, w dm2, hypertension, hyperlipidemia, CAD s/p CABG, h/o endocarditis, h/o AVR, Copd, apparently was recent at [facility] psych unit for dementia and now transitioned to home, and was brought in tonight for generalized weakness, and some confusion.  BMI indicates overweight status (appropriate for advanced age). Per chart review, pt consumed 25% of breakfast yesterday. He states that he believes he ate 100% of scrambled eggs this AM and did not eat breakfast potatoes. He needs assistance with feeding d/t bilateral arm weakness following hx of 2 strokes, reports most recent was 2 years ago. Pt lives with his son and reports he usually has a good appetite and that his son cooks anything he wants (specifies he greatly enjoys bacon and eggs for breakfast).   Per chart review, current weight consistent with weight from 11/2016 followed by weight gain through at least 02/2017. It appears that weights on 05/21/17 and 08/31/17 were stated weights. Based on weight from 02/19/17, pt has lost 10 lbs (5% body weight) in the past 8 months which is not significant for time frame. Also noted current need for high-rate IVF. Will monitor weight trends closely.   Medications reviewed; 20 mg oral Pepcid/day, 325 mg ferrous sulfate/day, sliding scale Novolog, 32 units Lantus/day. Labs reviewed; creatinine: 1.48 mg/d;, Ca: 8.1 mg/dL, GFR: 40 mL/min.   IVF: NS @  150 mL/hr.     NUTRITION - FOCUSED PHYSICAL EXAM:  Completed/assessed; no muscle or fat wasting, mild edema to BLE.  Diet Order:  Diet heart healthy/carb modified Room service appropriate? Yes; Fluid consistency: Thin  EDUCATION NEEDS:   No education needs have been identified at this time  Skin:  Skin Assessment: Reviewed RN Assessment  Last BM:  10/26/17  Height:   Ht Readings from Last 1 Encounters:  10/26/17 5\' 9"  (1.753 m)    Weight:   Wt Readings from Last 1 Encounters:  10/26/17 194 lb 0.1 oz (88 kg)    Ideal Body Weight:  72.73 kg  BMI:  Body mass index is 28.65 kg/m.  Estimated Nutritional Needs:   Kcal:  1585-1850 (18-21 kcal/kg)  Protein:  60-70 grams  Fluid:  >/= 1.8 L/day     Jarome Matin, MS, RD, LDN, [facility] Inpatient Clinical Dietitian Pager # [phone] After hours/weekend pager # [phone]

## 2017-10-28 DIAGNOSIS — G9341 Metabolic encephalopathy: Secondary | ICD-10-CM | POA: Diagnosis present

## 2017-10-28 DIAGNOSIS — K219 Gastro-esophageal reflux disease without esophagitis: Secondary | ICD-10-CM

## 2017-10-28 DIAGNOSIS — A419 Sepsis, unspecified organism: Principal | ICD-10-CM

## 2017-10-28 DIAGNOSIS — J189 Pneumonia, unspecified organism: Secondary | ICD-10-CM

## 2017-10-28 DIAGNOSIS — F322 Major depressive disorder, single episode, severe without psychotic features: Secondary | ICD-10-CM

## 2017-10-28 DIAGNOSIS — Z794 Long term (current) use of insulin: Secondary | ICD-10-CM

## 2017-10-28 DIAGNOSIS — N189 Chronic kidney disease, unspecified: Secondary | ICD-10-CM

## 2017-10-28 DIAGNOSIS — I4891 Unspecified atrial fibrillation: Secondary | ICD-10-CM

## 2017-10-28 DIAGNOSIS — I10 Essential (primary) hypertension: Secondary | ICD-10-CM

## 2017-10-28 DIAGNOSIS — N179 Acute kidney failure, unspecified: Secondary | ICD-10-CM | POA: Diagnosis present

## 2017-10-28 DIAGNOSIS — I5032 Chronic diastolic (congestive) heart failure: Secondary | ICD-10-CM | POA: Diagnosis present

## 2017-10-28 DIAGNOSIS — F0390 Unspecified dementia without behavioral disturbance: Secondary | ICD-10-CM | POA: Diagnosis present

## 2017-10-28 DIAGNOSIS — D509 Iron deficiency anemia, unspecified: Secondary | ICD-10-CM

## 2017-10-28 DIAGNOSIS — E118 Type 2 diabetes mellitus with unspecified complications: Secondary | ICD-10-CM

## 2017-10-28 LAB — GLUCOSE, CAPILLARY
GLUCOSE-CAPILLARY: 167 mg/dL — AB (ref 65–99)
GLUCOSE-CAPILLARY: 178 mg/dL — AB (ref 65–99)
Glucose-Capillary: 188 mg/dL — ABNORMAL HIGH (ref 65–99)
Glucose-Capillary: 166 mg/dL — ABNORMAL HIGH (ref 65–99)
Glucose-Capillary: 279 mg/dL — ABNORMAL HIGH (ref 65–99)

## 2017-10-28 LAB — CBC
HCT: 33 % — ABNORMAL LOW (ref 39.0–52.0)
Hemoglobin: 10.3 g/dL — ABNORMAL LOW (ref 13.0–17.0)
MCH: 30.4 pg (ref 26.0–34.0)
MCHC: 31.2 g/dL (ref 30.0–36.0)
MCV: 97.3 fL (ref 78.0–100.0)
PLATELETS: 303 10*3/uL (ref 150–400)
RBC: 3.39 MIL/uL — AB (ref 4.22–5.81)
RDW: 14.2 % (ref 11.5–15.5)
WBC: 7 10*3/uL (ref 4.0–10.5)

## 2017-10-28 LAB — BASIC METABOLIC PANEL
Anion gap: 5 (ref 5–15)
BUN: 18 mg/dL (ref 6–20)
CHLORIDE: 106 mmol/L (ref 101–111)
CO2: 26 mmol/L (ref 22–32)
CREATININE: 1.46 mg/dL — AB (ref 0.61–1.24)
Calcium: 8.5 mg/dL — ABNORMAL LOW (ref 8.9–10.3)
GFR calc Af Amer: 48 mL/min — ABNORMAL LOW (ref >60)
GFR, EST NON AFRICAN AMERICAN: 41 mL/min — AB (ref >60)
GLUCOSE: 204 mg/dL — AB (ref 65–99)
Potassium: 3.9 mmol/L (ref 3.5–5.1)
Sodium: 137 mmol/L (ref 135–145)

## 2017-10-28 MED ORDER — LEVALBUTEROL HCL 0.63 MG/3ML IN NEBU
0.6300 mg | INHALATION_SOLUTION | RESPIRATORY_TRACT | Status: DC | PRN
Start: 2017-10-28 — End: 2017-10-29

## 2017-10-28 MED ORDER — INSULIN GLARGINE 100 UNIT/ML ~~LOC~~ SOLN
15.0000 [IU] | Freq: Every day | SUBCUTANEOUS | Status: DC
  Administered 2017-10-28 – 2017-10-30 (×3): 15 [IU] via SUBCUTANEOUS
  Filled 2017-10-28 (×3): qty 0.15

## 2017-10-28 MED ORDER — IPRATROPIUM BROMIDE 0.02 % IN SOLN
0.5000 mg | Freq: Four times a day (QID) | RESPIRATORY_TRACT | Status: DC
  Filled 2017-10-28: qty 2.5

## 2017-10-28 MED ORDER — FLUOXETINE HCL 20 MG PO CAPS
40.0000 mg | ORAL_CAPSULE | Freq: Every day | ORAL | Status: DC
  Administered 2017-10-28 – 2017-10-30 (×3): 40 mg via ORAL
  Filled 2017-10-28 (×3): qty 2

## 2017-10-28 MED ORDER — LEVALBUTEROL HCL 0.63 MG/3ML IN NEBU
0.6300 mg | INHALATION_SOLUTION | Freq: Four times a day (QID) | RESPIRATORY_TRACT | Status: DC
  Filled 2017-10-28: qty 3

## 2017-10-28 MED ORDER — SENNOSIDES-DOCUSATE SODIUM 8.6-50 MG PO TABS
1.0000 | ORAL_TABLET | Freq: Two times a day (BID) | ORAL | Status: DC
  Administered 2017-10-28 – 2017-10-30 (×6): 1 via ORAL
  Filled 2017-10-28 (×6): qty 1

## 2017-10-28 MED ORDER — GUAIFENESIN ER 600 MG PO TB12
1200.0000 mg | ORAL_TABLET | Freq: Two times a day (BID) | ORAL | Status: DC
  Administered 2017-10-28 – 2017-10-30 (×6): 1200 mg via ORAL
  Filled 2017-10-28 (×6): qty 2

## 2017-10-28 MED ORDER — IPRATROPIUM BROMIDE 0.02 % IN SOLN: 0.5000 mg | RESPIRATORY_TRACT | Status: DC | PRN

## 2017-10-28 MED ORDER — METHYLPREDNISOLONE SODIUM SUCC 125 MG IJ SOLR
60.0000 mg | Freq: Once | INTRAMUSCULAR | Status: AC
  Administered 2017-10-28: 60 mg via INTRAVENOUS
  Filled 2017-10-28: qty 2

## 2017-10-28 MED ORDER — FLUTICASONE PROPIONATE 50 MCG/ACT NA SUSP
2.0000 | Freq: Every day | NASAL | Status: DC
  Administered 2017-10-29 – 2017-10-30 (×2): 2 via NASAL
  Filled 2017-10-28 (×2): qty 16

## 2017-10-28 MED ORDER — FUROSEMIDE 10 MG/ML IJ SOLN
20.0000 mg | Freq: Two times a day (BID) | INTRAMUSCULAR | Status: AC
  Administered 2017-10-28 (×2): 20 mg via INTRAVENOUS
  Filled 2017-10-28 (×2): qty 2

## 2017-10-28 MED ORDER — DONEPEZIL HCL 5 MG PO TABS
5.0000 mg | ORAL_TABLET | Freq: Every day | ORAL | Status: DC
  Administered 2017-10-28 – 2017-10-30 (×3): 5 mg via ORAL
  Filled 2017-10-28 (×3): qty 1

## 2017-10-28 MED ORDER — POLYVINYL ALCOHOL 1.4 % OP SOLN
1.0000 [drp] | OPHTHALMIC | Status: DC | PRN
  Administered 2017-10-28 – 2017-10-30 (×2): 1 [drp] via OPHTHALMIC
  Filled 2017-10-28 (×2): qty 15

## 2017-10-28 MED ORDER — PANTOPRAZOLE SODIUM 40 MG PO TBEC
40.0000 mg | DELAYED_RELEASE_TABLET | Freq: Every day | ORAL | Status: DC
Start: 2017-10-28 — End: 2017-10-31
  Administered 2017-10-28 – 2017-10-30 (×3): 40 mg via ORAL
  Filled 2017-10-28 (×3): qty 1

## 2017-10-28 NOTE — Progress Notes (Signed)
PROGRESS NOTE    Eric [name]  JIR:[id number] DOB: 01-10-1927 DOA: 10/25/2017 PCP: [name], MD    Brief Narrative:  84 y.o.male,w dm2, hypertension, hyperlipidemia, CAD s/p CABG, h/o endocarditis, h/o AVR, Copd, apparently was recent at [facility] psych unit for dementia and now transitioned to home, and was brought in tonight for generalized weakness, and some confusion. EMS apparently noted some wheezing and gave albuterol and atrovent. Pt has been coughing .  In ED, CT head negative.  Chest x-ray showed post surgical changes of CABG and AVR.  Mild left basilar atelectasis versus infiltrate. CT angiogram of chest and CT abdomen and pelvis were done which were consistent with multifocal pneumonia.  Patient placed empirically on IV antibiotics.     Assessment & Plan:   Principal Problem:   Sepsis ([place]) Active Problems:   HCAP (healthcare-associated pneumonia)   Insulin dependent diabetes mellitus with complications ([place])   Diabetic neuropathy associated with type 2 diabetes mellitus ([place])   HYPERCHOLESTEROLEMIA   History of CVA (cerebrovascular accident)   CKD (chronic kidney disease) stage 3, GFR 30-59 ml/min ([name]   Paroxysmal atrial fibrillation ([name]   GERD (gastroesophageal reflux disease)   Atrial fibrillation with RVR ([name]   Acute renal failure (ARF) ([name]   COPD (chronic obstructive pulmonary disease) ([name]   Essential hypertension, benign   Major depressive disorder, single episode, severe without psychotic features ([place])   Pneumonia   Tachycardia   Anemia   Acute kidney injury superimposed on CKD ([name]   Chronic diastolic CHF (congestive heart failure) ([place])   Dementia   Acute metabolic encephalopathy  #1 sepsis secondary to healthcare associated pneumonia/multifactorial pneumonia Patient with some clinical improvement however not at baseline.  Patient currently afebrile.  Normal white count.  Urine pneumococcus antigen negative.  Cultures with no growth  to date.  Urine cultures are negative.  MRSA PCR was negative.  Patient with poor air movement, some coarse scattered breath sounds.  Improved oxygenation.  Will place on Mucinex 1200 mg twice daily, Flonase, Protonix.  Give a dose of IV Solu-Medrol 60 mg x1 as patient with some wheezing noted on examination.  Discontinue albuterol nebs as patient with A. fib with RVR and placed on Atrovent and Xopenex scheduled nebulizers.  Continue empiric IV cefepime and IV azithromycin.  Given a dose of Lasix 20 mg IV every 12 hours x2 doses and follow.  2.  A. fib with RVR Patient currently rate controlled on home regimen of oral Cardizem.  Continue eliquis for anticoagulation.  3. Diabetes mellitus Patient noted overnight to have a CBG of 56.  Likely secondary to poor oral intake.  Hemoglobin A1c was 7.9 on February 12, 2017.  Decrease long-acting Lantus to 15 units daily.  Continue sliding scale insulin.  4.  Acute on chronic kidney disease stage III Likely secondary to a prerenal azotemia.  Renal function improved with hydration.  IV fluids have been discontinued.  Follow closely.  5.  Chronic diastolic heart failure Patient denies any chest pain.  2D echo done 10/26/2017 with a EF of 55-60%, no wall motion abnormalities, bioprosthesis present and functioning normally, right ventricular systolic pressure was increased consistent with mild pulmonary hypertension.  Patient diuretics have been on hold.  We will give patient Lasix 20 mg IV every 12 hours x2 doses.  Likely resume home regimen of diuretics tomorrow.  6.  Hyperlipidemia Continue statin.  7.  Gastroesophageal reflux disease Continue Pepcid.  Resume Protonix.  8.  Dementia Resume home dose  Aricept.  9.  Major depressive disorder Currently stable.  Resume home regimen of Prozac.  10.  Hypertension Stable.  Continue Cardizem.  Monitor closely on diuretics.  11.  Iron deficiency anemia To oral iron supplementation.  Place on stool  softener.  12.  Metabolic encephalopathy Patient noted on admission to have presented with some confusion.  Likely secondary to pneumonia in the setting of dementia.  Mentation has improved.  Follow.    DVT prophylaxis: eliquis Code Status: Full Family Communication: Updated patient.  No family present. Disposition Plan: Remain in stepdown unit today.  If continued improvement likely transfer to telemetry floor tomorrow.  Likely need skilled nursing facility once medically stable.   Consultants:   None  Procedures:   CT abdomen and pelvis 10/25/2017  CT chest without contrast 10/25/2017  CT head without contrast 10/25/2017  Chest x-ray 10/25/2017  2D echo 10/26/2017    Antimicrobials:   IV azithromycin 10/26/2017>>>>> 11/01/2017  IV cefepime 10/26/2017>>>>>>> 11/02/2017  IV vancomycin 10/25/2017>>>>> 10/26/2017   Subjective: Patient complaining of some congestion.  Patient denies any chest pain.  Patient complaining of cough.  When asked whether he was short of breath patient just kept complaining about his nasal congestion.  Noted to have a CBG of 56 overnight.  Vision also with some complaints of abdominal discomfort.  Patient with complaint of generalized weakness.  Objective: Vitals:   10/28/17 0500 10/28/17 0600 10/28/17 0800 10/28/17 0956  BP: 119/61 (!) 100/45    Pulse: 70 80    Resp: (!) 21 (!) 21    Temp:   97.9 F (36.6 C)   TempSrc:   Oral   SpO2: 95% 96%    Weight:    91.3 kg (201 lb 4.5 oz)  Height:        Intake/Output Summary (Last 24 hours) at 10/28/2017 1015 Last data filed at 10/27/2017 2000 Gross per 24 hour  Intake 580 ml  Output 600 ml  Net -20 ml   Filed Weights   10/26/17 0115 10/28/17 0956  Weight: 88 kg (194 lb 0.1 oz) 91.3 kg (201 lb 4.5 oz)    Examination:  General exam: Appears calm and comfortable  Respiratory system: Poor air movement.  Some scattered crackles.  Minimal expiratory wheezing.  Some coarse diffuse rhonchorous breath sounds.   Respiratory effort normal. Cardiovascular system: Irregularly irregular. No JVD, murmurs, rubs, gallops or clicks. No pedal edema. Gastrointestinal system: Abdomen is nondistended, soft and nontender. No organomegaly or masses felt. Normal bowel sounds heard. Central nervous system: Alert and oriented. No focal neurological deficits. Extremities: Symmetric 5 x 5 power. Skin: No rashes, lesions or ulcers Psychiatry: Judgement and insight appear normal. Mood & affect appropriate.     Data Reviewed: I have personally reviewed following labs and imaging studies  CBC: Recent Labs  Lab 10/25/17 1640 10/26/17 0750 10/27/17 0303 10/28/17 0256  WBC 10.4 7.6 6.8 7.0  NEUTROABS 7.9*  --   --   --   HGB 12.8* 10.5* 10.5* 10.3*  HCT 39.5 32.4* 33.3* 33.0*  MCV 95.9 96.1 97.4 97.3  PLT 363 298 307 540   Basic Metabolic Panel: Recent Labs  Lab 10/25/17 1640 10/26/17 0750 10/27/17 0303 10/28/17 0256  NA 137 137 139 137  K 3.7 4.6 3.6 3.9  CL 98* 106 109 106  CO2 22 24 25 26   GLUCOSE 271* 300* 167* 204*  BUN 22* 23* 16 18  CREATININE 1.98* 1.83* 1.48* 1.46*  CALCIUM 9.1 8.0* 8.1* 8.5*  GFR: Estimated Creatinine Clearance: 39 mL/min (A) (by C-G formula based on SCr of 1.46 mg/dL (H)). Liver Function Tests: Recent Labs  Lab 10/25/17 1640 10/26/17 0750  AST 21 14*  ALT 9* 8*  ALKPHOS 81 66  BILITOT 1.2 0.6  PROT 7.8 6.4*  ALBUMIN 3.3* 2.6*   No results for input(s): LIPASE, AMYLASE in the last 168 hours. No results for input(s): AMMONIA in the last 168 hours. Coagulation Profile: No results for input(s): INR, PROTIME in the last 168 hours. Cardiac Enzymes: Recent Labs  Lab 10/26/17 0215 10/26/17 1332 10/26/17 1813  TROPONINI <0.03 0.03* <0.03   BNP (last 3 results) No results for input(s): PROBNP in the last 8760 hours. HbA1C: No results for input(s): HGBA1C in the last 72 hours. CBG: Recent Labs  Lab 10/27/17 1639 10/27/17 2156 10/27/17 2226 10/28/17 0254  10/28/17 0730  GLUCAP 164* 56* 67 188* 166*   Lipid Profile: No results for input(s): CHOL, HDL, LDLCALC, TRIG, CHOLHDL, LDLDIRECT in the last 72 hours. Thyroid Function Tests: Recent Labs    10/26/17 0215  TSH 1.046   Anemia Panel: No results for input(s): VITAMINB12, FOLATE, FERRITIN, TIBC, IRON, RETICCTPCT in the last 72 hours. Sepsis Labs: Recent Labs  Lab 10/25/17 1648 10/25/17 1819 10/26/17 1044  LATICACIDVEN 5.07* 4.85* 1.0    Recent Results (from the past 240 hour(s))  Blood culture (routine x 2)     Status: None (Preliminary result)   Collection Time: 10/25/17  4:34 PM  Result Value Ref Range Status   Specimen Description BLOOD RIGHT ANTECUBITAL  Final   Special Requests   Final    BOTTLES DRAWN AEROBIC AND ANAEROBIC Blood Culture adequate volume   Culture   Final    NO GROWTH 2 DAYS Performed at [facility], [address] [place], [place] [address]    Report Status PENDING  Incomplete  Blood culture (routine x 2)     Status: None (Preliminary result)   Collection Time: 10/25/17  4:40 PM  Result Value Ref Range Status   Specimen Description BLOOD LEFT ANTECUBITAL  Final   Special Requests   Final    BOTTLES DRAWN AEROBIC AND ANAEROBIC Blood Culture adequate volume   Culture   Final    NO GROWTH 2 DAYS Performed at [facility], [place] [address], [place], [place] [address]    Report Status PENDING  Incomplete  Urine culture     Status: None   Collection Time: 10/25/17  4:50 PM  Result Value Ref Range Status   Specimen Description URINE, CATHETERIZED  Final   Special Requests NONE  Final   Culture   Final    NO GROWTH Performed at [facility], [place] [address], [place], [place] [address]    Report Status 10/26/2017 FINAL  Final  MRSA PCR Screening     Status: None   Collection Time: 10/26/17  1:00 AM  Result Value Ref Range Status   MRSA by PCR NEGATIVE NEGATIVE Final    Comment:        The GeneXpert MRSA Assay (FDA approved for  NASAL specimens only), is one component of a comprehensive MRSA colonization surveillance program. It is not intended to diagnose MRSA infection nor to guide or monitor treatment for MRSA infections.   Culture, blood (routine x 2) Call MD if unable to obtain prior to antibiotics being given     Status: None (Preliminary result)   Collection Time: 10/26/17  2:15 AM  Result Value Ref Range Status   Specimen Description BLOOD RIGHT HAND  Final   Special Requests IN PEDIATRIC BOTTLE Blood Culture adequate volume  Final   Culture   Final    NO GROWTH 1 DAY Performed at [facility], [address] [place], [place] [address]    Report Status PENDING  Incomplete  Culture, blood (routine x 2) Call MD if unable to obtain prior to antibiotics being given     Status: None (Preliminary result)   Collection Time: 10/26/17  2:15 AM  Result Value Ref Range Status   Specimen Description BLOOD LEFT HAND  Final   Special Requests   Final    IN PEDIATRIC BOTTLE Blood Culture results may not be optimal due to an excessive volume of blood received in culture bottles   Culture   Final    NO GROWTH 1 DAY Performed at [facility], [place] [address] [place], [place] [address]    Report Status PENDING  Incomplete         Radiology Studies: No results found.      Scheduled Meds: . apixaban  2.5 mg Oral BID  . atorvastatin  20 mg Oral q1800  . diltiazem  30 mg Oral BID  . famotidine  20 mg Oral Daily  . ferrous sulfate  325 mg Oral Q breakfast  . fluticasone  2 spray Each Nare Daily  . furosemide  20 mg Intravenous Q12H  . guaiFENesin  1,200 mg Oral BID  . insulin aspart  0-15 Units Subcutaneous TID WC  . insulin aspart  0-5 Units Subcutaneous QHS  . insulin glargine  15 Units Subcutaneous QHS  . methylPREDNISolone (SOLU-MEDROL) injection  60 mg Intravenous Once  . ondansetron (ZOFRAN) IV  4 mg Intravenous Once  . pantoprazole  40 mg Oral Q0600  . protein supplement shake   11 oz Oral Q24H   Continuous Infusions: . azithromycin Stopped (10/27/17 1816)  . ceFEPime (MAXIPIME) IV Stopped (10/27/17 2238)     LOS: 3 days    Time spent: 63 minutes    Irine Seal, MD [facility] Pager [phone]  If 7PM-7AM, please contact night-coverage [email or web address] Password [email or web address] 10/28/2017, 10:15 AM

## 2017-10-28 NOTE — Progress Notes (Signed)
Pt has refused breathing treatments multiple times today. He says he is not wheezing. He also says his nose is unclogged and will let us know if he wants to take a nebulizer treatment.

## 2017-10-29 ENCOUNTER — Inpatient Hospital Stay: Payer: Self-pay | Admitting: Family Medicine

## 2017-10-29 LAB — BASIC METABOLIC PANEL
ANION GAP: 11 (ref 5–15)
BUN: 25 mg/dL — ABNORMAL HIGH (ref 6–20)
CHLORIDE: 99 mmol/L — AB (ref 101–111)
CO2: 24 mmol/L (ref 22–32)
Calcium: 8.6 mg/dL — ABNORMAL LOW (ref 8.9–10.3)
Creatinine, Ser: 1.56 mg/dL — ABNORMAL HIGH (ref 0.61–1.24)
GFR calc Af Amer: 44 mL/min — ABNORMAL LOW (ref >60)
GFR calc non Af Amer: 38 mL/min — ABNORMAL LOW (ref >60)
GLUCOSE: 246 mg/dL — AB (ref 65–99)
POTASSIUM: 4.2 mmol/L (ref 3.5–5.1)
Sodium: 134 mmol/L — ABNORMAL LOW (ref 135–145)

## 2017-10-29 LAB — CBC WITH DIFFERENTIAL/PLATELET
BASOS ABS: 0 10*3/uL (ref 0.0–0.1)
Basophils Relative: 0 %
Eosinophils Absolute: 0 10*3/uL (ref 0.0–0.7)
Eosinophils Relative: 0 %
HEMATOCRIT: 32.5 % — AB (ref 39.0–52.0)
Hemoglobin: 10.6 g/dL — ABNORMAL LOW (ref 13.0–17.0)
LYMPHS PCT: 7 %
Lymphs Abs: 0.4 10*3/uL — ABNORMAL LOW (ref 0.7–4.0)
MCH: 30.5 pg (ref 26.0–34.0)
MCHC: 32.6 g/dL (ref 30.0–36.0)
MCV: 93.4 fL (ref 78.0–100.0)
Monocytes Absolute: 0.1 10*3/uL (ref 0.1–1.0)
Monocytes Relative: 3 %
NEUTROS ABS: 4.9 10*3/uL (ref 1.7–7.7)
Neutrophils Relative %: 90 %
Platelets: 303 10*3/uL (ref 150–400)
RBC: 3.48 MIL/uL — AB (ref 4.22–5.81)
RDW: 13.8 % (ref 11.5–15.5)
WBC: 5.4 10*3/uL (ref 4.0–10.5)

## 2017-10-29 LAB — GLUCOSE, CAPILLARY
GLUCOSE-CAPILLARY: 194 mg/dL — AB (ref 65–99)
GLUCOSE-CAPILLARY: 204 mg/dL — AB (ref 65–99)
Glucose-Capillary: 208 mg/dL — ABNORMAL HIGH (ref 65–99)
Glucose-Capillary: 182 mg/dL — ABNORMAL HIGH (ref 65–99)

## 2017-10-29 LAB — LEGIONELLA PNEUMOPHILA SEROGP 1 UR AG: L. PNEUMOPHILA SEROGP 1 UR AG: NEGATIVE

## 2017-10-29 MED ORDER — CEFPODOXIME PROXETIL 200 MG PO TABS
200.0000 mg | ORAL_TABLET | Freq: Two times a day (BID) | ORAL | Status: DC
  Administered 2017-10-29 – 2017-10-30 (×3): 200 mg via ORAL
  Filled 2017-10-29 (×3): qty 1

## 2017-10-29 MED ORDER — FUROSEMIDE 20 MG PO TABS
20.0000 mg | ORAL_TABLET | Freq: Two times a day (BID) | ORAL | Status: DC
  Administered 2017-10-29 – 2017-10-30 (×4): 20 mg via ORAL
  Filled 2017-10-29 (×4): qty 1

## 2017-10-29 MED ORDER — AZITHROMYCIN 250 MG PO TABS
500.0000 mg | ORAL_TABLET | Freq: Every day | ORAL | Status: AC
  Administered 2017-10-29: 500 mg via ORAL
  Filled 2017-10-29: qty 2

## 2017-10-29 MED ORDER — METHYLPREDNISOLONE SODIUM SUCC 125 MG IJ SOLR
60.0000 mg | Freq: Two times a day (BID) | INTRAMUSCULAR | Status: DC
  Administered 2017-10-30: 60 mg via INTRAVENOUS
  Filled 2017-10-29: qty 2

## 2017-10-29 MED ORDER — LEVALBUTEROL HCL 0.63 MG/3ML IN NEBU: 0.6300 mg | INHALATION_SOLUTION | RESPIRATORY_TRACT | Status: DC | PRN

## 2017-10-29 MED ORDER — IPRATROPIUM BROMIDE 0.02 % IN SOLN: 0.5000 mg | RESPIRATORY_TRACT | Status: DC | PRN

## 2017-10-29 MED ORDER — METHYLPREDNISOLONE SODIUM SUCC 125 MG IJ SOLR
60.0000 mg | Freq: Three times a day (TID) | INTRAMUSCULAR | Status: DC
  Administered 2017-10-29 (×2): 60 mg via INTRAVENOUS
  Filled 2017-10-29: qty 2

## 2017-10-29 NOTE — Progress Notes (Signed)
PROGRESS NOTE    Eric [name]  WUJ:[id number] DOB: August 28, 1927 DOA: 10/25/2017 PCP: [name], MD    Brief Narrative:  84 y.o.male,w dm2, hypertension, hyperlipidemia, CAD s/p CABG, h/o endocarditis, h/o AVR, Copd, apparently was recent at [facility] psych unit for dementia and now transitioned to home, and was brought in tonight for generalized weakness, and some confusion. EMS apparently noted some wheezing and gave albuterol and atrovent. Pt has been coughing .  In ED, CT head negative.  Chest x-ray showed post surgical changes of CABG and AVR.  Mild left basilar atelectasis versus infiltrate. CT angiogram of chest and CT abdomen and pelvis were done which were consistent with multifocal pneumonia.  Patient placed empirically on IV antibiotics.     Assessment & Plan:   Principal Problem:   Sepsis ([place]) Active Problems:   HCAP (healthcare-associated pneumonia)   Insulin dependent diabetes mellitus with complications ([place])   Diabetic neuropathy associated with type 2 diabetes mellitus ([place])   HYPERCHOLESTEROLEMIA   History of CVA (cerebrovascular accident)   CKD (chronic kidney disease) stage 3, GFR 30-59 ml/min ([name]   Paroxysmal atrial fibrillation ([name]   GERD (gastroesophageal reflux disease)   Atrial fibrillation with RVR ([name]   Acute renal failure (ARF) ([name]   COPD (chronic obstructive pulmonary disease) ([name]   Essential hypertension, benign   Major depressive disorder, single episode, severe without psychotic features ([place])   Pneumonia   Tachycardia   Anemia   Acute kidney injury superimposed on CKD ([name]   Chronic diastolic CHF (congestive heart failure) ([place])   Dementia   Acute metabolic encephalopathy  #1 sepsis secondary to healthcare associated pneumonia/multifactorial pneumonia Patient with some clinical improvement however not at baseline.  Patient currently afebrile.  Normal white count.  Urine pneumococcus antigen negative.  Cultures with no growth  to date.  Urine cultures are negative.  MRSA PCR was negative.  Patient with poor air movement, some coarse scattered breath sounds.  Improved oxygenation.  Will place on Mucinex 1200 mg twice daily, Flonase, Protonix.  Give a dose of IV Solu-Medrol 60 mg x1 as patient with some wheezing noted on examination.  Discontinued albuterol nebs as patient with A. fib with RVR and placed on Atrovent and Xopenex scheduled nebulizers.  Status post Lasix 20 mg IV every 12 hours x2 doses a urine output of 2.6 L over the past 24 hours.  Placed on home dose oral Lasix 20 mg twice daily.  Discontinue IV antibiotics and placed on oral dose of Vantin to complete a course of antibiotic treatment.  Change IV Solu-Medrol to oral's prednisone taper.   2.  A. fib with RVR Patient currently rate controlled on home regimen of oral Cardizem.  Continue eliquis for anticoagulation.  3. Diabetes mellitus Patient noted overnight of 11/06/2017, to have a CBG of 56.  Likely secondary to poor oral intake.  Hemoglobin A1c was 7.9 on February 12, 2017.  This was decreased to 15 units daily with improvement with hypoglycemia.  Continue sliding scale insulin.   4.  Acute on chronic kidney disease stage III Likely secondary to a prerenal azotemia.  Renal function improved with hydration.  IV fluids have been discontinued.  Monitor closely with diuretics.   5.  Chronic diastolic heart failure Patient denies any chest pain.  2D echo done 10/26/2017 with a EF of 55-60%, no wall motion abnormalities, bioprosthesis present and functioning normally, right ventricular systolic pressure was increased consistent with mild pulmonary hypertension.  Patient diuretics have been on  hold.  Status post Lasix 20 mg IV every 12 hours x2 doses.  Place on home dose oral Lasix 20 mg twice daily and follow.  6.  Hyperlipidemia Continue statin.  7.  Gastroesophageal reflux disease Continue Pepcid and Protonix.  8.  Dementia Continue Aricept.    9.  Major  depressive disorder Currently stable.  Continue Prozac.  10.  Hypertension Stable.  Continue Cardizem.  Monitor closely on diuretics.  11.  Iron deficiency anemia Continue oral iron supplementation.   12.  Metabolic encephalopathy Patient noted on admission to have presented with some confusion.  Likely secondary to pneumonia in the setting of dementia.  Mentation has improved.  Patient probably close to his baseline.  Follow.    DVT prophylaxis: eliquis Code Status: Full Family Communication: Updated patient.  No family present. Disposition Plan: Likely to skilled nursing facility when medically stable in the next 24-48 hours.     Consultants:   None  Procedures:   CT abdomen and pelvis 10/25/2017  CT chest without contrast 10/25/2017  CT head without contrast 10/25/2017  Chest x-ray 10/25/2017  2D echo 10/26/2017    Antimicrobials:   IV azithromycin 10/26/2017>>>>> 10/29/2017  IV cefepime 10/26/2017>>>>>>> 10/29/2017  IV vancomycin 10/25/2017>>>>> 10/26/2017  Vantin 10/29/2017>>>>> 11/01/2017   Subjective: Patient up in bed.  Denies chest pain.  States shortness of breath improving.  Cough improving.  Complaint of generalized weakness.    Objective: Vitals:   10/29/17 0200 10/29/17 0300 10/29/17 0356 10/29/17 0500  BP: 121/65 128/73 133/77   Pulse: 80 79 84   Resp: 17 16 20    Temp:   98.1 F (36.7 C)   TempSrc:   Oral   SpO2: 95% 95% 95%   Weight:    91.2 kg (201 lb 1 oz)  Height:        Intake/Output Summary (Last 24 hours) at 10/29/2017 1151 Last data filed at 10/29/2017 0927 Gross per 24 hour  Intake 240 ml  Output 2950 ml  Net -2710 ml   Filed Weights   10/26/17 0115 10/28/17 0956 10/29/17 0500  Weight: 88 kg (194 lb 0.1 oz) 91.3 kg (201 lb 4.5 oz) 91.2 kg (201 lb 1 oz)    Examination:  General exam: Appears calm and comfortable  Respiratory system: Fair air movement.  Some scattered crackles.  Mild expiratory wheezing.  Less coarse rhonchorous breath  sounds. Respiratory effort normal. Cardiovascular system: Irregularly irregular. No JVD, murmurs, rubs, gallops or clicks. No pedal edema. Gastrointestinal system: Abdomen is soft, nontender, nondistended, positive bowel sounds.  Central nervous system: Alert and oriented. No focal neurological deficits. Extremities: Symmetric 5 x 5 power. Skin: No rashes, lesions or ulcers Psychiatry: Judgement and insight appear normal. Mood & affect appropriate.     Data Reviewed: I have personally reviewed following labs and imaging studies  CBC: Recent Labs  Lab 10/25/17 1640 10/26/17 0750 10/27/17 0303 10/28/17 0256 10/29/17 0306  WBC 10.4 7.6 6.8 7.0 5.4  NEUTROABS 7.9*  --   --   --  4.9  HGB 12.8* 10.5* 10.5* 10.3* 10.6*  HCT 39.5 32.4* 33.3* 33.0* 32.5*  MCV 95.9 96.1 97.4 97.3 93.4  PLT 363 298 307 303 096   Basic Metabolic Panel: Recent Labs  Lab 10/25/17 1640 10/26/17 0750 10/27/17 0303 10/28/17 0256 10/29/17 0306  NA 137 137 139 137 134*  K 3.7 4.6 3.6 3.9 4.2  CL 98* 106 109 106 99*  CO2 22 24 25 26 24   GLUCOSE 271* 300*  167* 204* 246*  BUN 22* 23* 16 18 25*  CREATININE 1.98* 1.83* 1.48* 1.46* 1.56*  CALCIUM 9.1 8.0* 8.1* 8.5* 8.6*   GFR: Estimated Creatinine Clearance: 36.5 mL/min (A) (by C-G formula based on SCr of 1.56 mg/dL (H)). Liver Function Tests: Recent Labs  Lab 10/25/17 1640 10/26/17 0750  AST 21 14*  ALT 9* 8*  ALKPHOS 81 66  BILITOT 1.2 0.6  PROT 7.8 6.4*  ALBUMIN 3.3* 2.6*   No results for input(s): LIPASE, AMYLASE in the last 168 hours. No results for input(s): AMMONIA in the last 168 hours. Coagulation Profile: No results for input(s): INR, PROTIME in the last 168 hours. Cardiac Enzymes: Recent Labs  Lab 10/26/17 0215 10/26/17 1332 10/26/17 1813  TROPONINI <0.03 0.03* <0.03   BNP (last 3 results) No results for input(s): PROBNP in the last 8760 hours. HbA1C: No results for input(s): HGBA1C in the last 72 hours. CBG: Recent Labs    Lab 10/28/17 0730 10/28/17 1235 10/28/17 1631 10/28/17 2110 10/29/17 0809  GLUCAP 166* 167* 178* 279* 208*   Lipid Profile: No results for input(s): CHOL, HDL, LDLCALC, TRIG, CHOLHDL, LDLDIRECT in the last 72 hours. Thyroid Function Tests: No results for input(s): TSH, T4TOTAL, FREET4, T3FREE, THYROIDAB in the last 72 hours. Anemia Panel: No results for input(s): VITAMINB12, FOLATE, FERRITIN, TIBC, IRON, RETICCTPCT in the last 72 hours. Sepsis Labs: Recent Labs  Lab 10/25/17 1648 10/25/17 1819 10/26/17 1044  LATICACIDVEN 5.07* 4.85* 1.0    Recent Results (from the past 240 hour(s))  Blood culture (routine x 2)     Status: None (Preliminary result)   Collection Time: 10/25/17  4:34 PM  Result Value Ref Range Status   Specimen Description BLOOD RIGHT ANTECUBITAL  Final   Special Requests   Final    BOTTLES DRAWN AEROBIC AND ANAEROBIC Blood Culture adequate volume   Culture   Final    NO GROWTH 3 DAYS Performed at [facility], [address], [place], [place] [address]    Report Status PENDING  Incomplete  Blood culture (routine x 2)     Status: None (Preliminary result)   Collection Time: 10/25/17  4:40 PM  Result Value Ref Range Status   Specimen Description BLOOD LEFT ANTECUBITAL  Final   Special Requests   Final    BOTTLES DRAWN AEROBIC AND ANAEROBIC Blood Culture adequate volume   Culture   Final    NO GROWTH 3 DAYS Performed at [facility], [place] [address] [place], [place] [address]    Report Status PENDING  Incomplete  Urine culture     Status: None   Collection Time: 10/25/17  4:50 PM  Result Value Ref Range Status   Specimen Description URINE, CATHETERIZED  Final   Special Requests NONE  Final   Culture   Final    NO GROWTH Performed at [facility], [place] [address], [place], [place] [address]    Report Status 10/26/2017 FINAL  Final  MRSA PCR Screening     Status: None   Collection Time: 10/26/17  1:00 AM  Result Value Ref Range  Status   MRSA by PCR NEGATIVE NEGATIVE Final    Comment:        The GeneXpert MRSA Assay (FDA approved for NASAL specimens only), is one component of a comprehensive MRSA colonization surveillance program. It is not intended to diagnose MRSA infection nor to guide or monitor treatment for MRSA infections.   Culture, blood (routine x  2) Call MD if unable to obtain prior to antibiotics being given     Status: None (Preliminary result)   Collection Time: 10/26/17  2:15 AM  Result Value Ref Range Status   Specimen Description BLOOD RIGHT HAND  Final   Special Requests IN PEDIATRIC BOTTLE Blood Culture adequate volume  Final   Culture   Final    NO GROWTH 2 DAYS Performed at [facility], [address] [place], [place] [address]    Report Status PENDING  Incomplete  Culture, blood (routine x 2) Call MD if unable to obtain prior to antibiotics being given     Status: None (Preliminary result)   Collection Time: 10/26/17  2:15 AM  Result Value Ref Range Status   Specimen Description BLOOD LEFT HAND  Final   Special Requests   Final    IN PEDIATRIC BOTTLE Blood Culture results may not be optimal due to an excessive volume of blood received in culture bottles   Culture   Final    NO GROWTH 2 DAYS Performed at [facility], [place] [address] [place], [place] [address]    Report Status PENDING  Incomplete         Radiology Studies: No results found.      Scheduled Meds: . apixaban  2.5 mg Oral BID  . atorvastatin  20 mg Oral q1800  . diltiazem  30 mg Oral BID  . donepezil  5 mg Oral QHS  . famotidine  20 mg Oral Daily  . ferrous sulfate  325 mg Oral Q breakfast  . FLUoxetine  40 mg Oral Daily  . fluticasone  2 spray Each Nare Daily  . furosemide  20 mg Oral BID  . guaiFENesin  1,200 mg Oral BID  . insulin aspart  0-15 Units Subcutaneous TID WC  . insulin aspart  0-5 Units Subcutaneous QHS  . insulin glargine  15 Units Subcutaneous QHS  . ipratropium  0.5  mg Nebulization Q6H  . levalbuterol  0.63 mg Nebulization Q6H  . ondansetron (ZOFRAN) IV  4 mg Intravenous Once  . pantoprazole  40 mg Oral Q0600  . protein supplement shake  11 oz Oral Q24H  . senna-docusate  1 tablet Oral BID   Continuous Infusions: . azithromycin 500 mg (10/28/17 1759)  . ceFEPime (MAXIPIME) IV Stopped (10/29/17 1045)     LOS: 4 days    Time spent: 40 minutes    Irine Seal, MD [facility] Pager [phone]  If 7PM-7AM, please contact night-coverage [email or web address] Password [email or web address] 10/29/2017, 11:51 AM

## 2017-10-29 NOTE — Progress Notes (Signed)
Physical Therapy Treatment Patient Details Name: MAAN ZARCONE MRN: [id number] DOB: 12/22/1926 Today's Date: 10/29/2017    History of Present Illness    84 y.o. male, w dm2, hypertension, hyperlipidemia, CAD s/p CABG, h/o endocarditis, h/o AVR, Copd, stroke with right residual, recent stay in  [facility] psych unit for dementia and SI, and now transitioned to home, and was brought 1/06/ in tonight for generalized weakness, and some confusion,  found to have CAP.    PT Comments    Pt was less confused, talkative and more willing to participate today. RN in room upon arrival, reported he was weaned off O2. Took extra time upon standing to self-correct balance with hands on RW.  Pt is still quite deconditioned but was able to transfer from bed to recliner with 2 assist for maintaining balance.    Follow Up Recommendations  SNF     Equipment Recommendations  None recommended by PT    Recommendations for Other Services       Precautions / Restrictions Precautions Precautions: Fall Precaution Comments: right side weaker Restrictions Weight Bearing Restrictions: No    Mobility  Bed Mobility Overal bed mobility: Needs Assistance Bed Mobility: Supine to Sit     Supine to sit: HOB elevated;+2 for safety/equipment;Mod assist Sit to supine: Mod assist;+2 for safety/equipment   General bed mobility comments: requires assist for trunk upright  Transfers Overall transfer level: Needs assistance Equipment used: Rolling walker (2 wheeled) Transfers: Sit to/from Stand Sit to Stand: Mod assist;+2 physical assistance Stand pivot transfers: Min assist;+2 safety/equipment       General transfer comment:  requires assist for maintaining balance while standing, multimodal cues for technique  Ambulation/Gait Ambulation/Gait assistance: Mod assist;+2 safety/equipment Ambulation Distance (Feet): 3 Feet Assistive device: Rolling walker (2 wheeled) Gait Pattern/deviations: Step-to pattern     General Gait Details: verbal cues for sequence, UE positioning on RW, taking bigger steps, pt took some steps from bed over to recliner   Stairs            Wheelchair Mobility    Modified Rankin (Stroke Patients Only)       Balance Overall balance assessment: Needs assistance;History of Falls Sitting-balance support: Bilateral upper extremity supported;Feet supported Sitting balance-Leahy Scale: Poor Sitting balance - Comments: able to self support by holding RW/EOB   Standing balance support: During functional activity;Bilateral upper extremity supported Standing balance-Leahy Scale: Poor Standing balance comment: tendency to lean backward upon standing, able to self correct with cues, requires UE support                            Cognition Arousal/Alertness: Awake/alert Behavior During Therapy: WFL for tasks assessed/performed Overall Cognitive Status: No family/caregiver present to determine baseline cognitive functioning                                 General Comments: pt is HOH, talkative, follows commands      Exercises      General Comments        Pertinent Vitals/Pain Pain Assessment: No/denies pain Faces Pain Scale: Hurts a little bit Pain Location: a little bit here and a little bit there Pain Intervention(s): Limited activity within patient's tolerance;Monitored during session    Home Living                      Prior Function  PT Goals (current goals can now be found in the care plan section) Acute Rehab PT Goals Patient Stated Goal: to get better PT Goal Formulation: With patient Time For Goal Achievement: 11/10/17 Potential to Achieve Goals: Fair Progress towards PT goals: Progressing toward goals    Frequency    Min 2X/week      PT Plan Current plan remains appropriate    Co-evaluation              AM-PAC PT "6 Clicks" Daily Activity  Outcome Measure  Difficulty turning  over in bed (including adjusting bedclothes, sheets and blankets)?: Unable Difficulty moving from lying on back to sitting on the side of the bed? : Unable Difficulty sitting down on and standing up from a chair with arms (e.g., wheelchair, bedside commode, etc,.)?: Unable Help needed moving to and from a bed to chair (including a wheelchair)?: A Lot Help needed walking in hospital room?: A Lot Help needed climbing 3-5 steps with a railing? : Total 6 Click Score: 8    End of Session Equipment Utilized During Treatment: Gait belt Activity Tolerance: Patient tolerated treatment well Patient left: in chair;with chair alarm set;with call bell/phone within reach   PT Visit Diagnosis: Difficulty in walking, not elsewhere classified (R26.2)     Time: 8341-9622 PT Time Calculation (min) (ACUTE ONLY): 21 min  Charges:  $Therapeutic Activity: 8-22 mins                    G Codes:       [place] Zafira Munos, SPT   [place] Katilynn Sinkler 10/29/2017, 4:11 PM

## 2017-10-29 NOTE — Care Management Important Message (Signed)
Important Message  Patient Details  Name: Eric [name] MRN: [id number] Date of Birth: 09/15/1927   Medicare Important Message Given:  Yes    Kerin Salen 10/29/2017, 10:35 AMImportant Message  Patient Details  Name: Eric [name] MRN: [id number] Date of Birth: March 28, 1927   Medicare Important Message Given:  Yes    Kerin Salen 10/29/2017, 10:34 AM

## 2017-10-29 NOTE — [place] FL2 (Signed)
[place] LEVEL OF CARE SCREENING TOOL     IDENTIFICATION  Patient Name: Eric [name] Birthdate: [birth year] Sex: male Admission Date (Current Location): 10/25/2017  [facility] and [place] Number:  Herbalist and Address:  [facility],  [place], [place]      Provider Number: [id number]  Attending Physician Name and Address:  Eugenie Filler, MD  Relative Name and Phone Number:       Current Level of Care: Hospital Recommended Level of Care: [place] Prior Approval Number:    Date Approved/Denied:   PASRR Number: [id number]  Discharge Plan: SNF    Current Diagnoses: Patient Active Problem List   Diagnosis Date Noted  . Acute kidney injury superimposed on CKD ([place]) 10/28/2017  . Chronic diastolic CHF (congestive heart failure) ([place]) 10/28/2017  . Dementia 10/28/2017  . Acute metabolic encephalopathy 73/41/9379  . Pneumonia 10/25/2017  . Tachycardia 10/25/2017  . Anemia 10/25/2017  . HCAP (healthcare-associated pneumonia) 10/25/2017  . Major depressive disorder, single episode, severe without psychotic features ([place]) 10/09/2017  . Stroke ([place])   . History of aortic stenosis   . Foraminal stenosis of cervical region   . Essential hypertension, benign   . Esophageal reflux   . Diabetes mellitus without complication ([place])   . Colon polyp   . Cervicalgia   . Cerebrovascular disease, unspecified   . Acute endocarditis   . Acute diastolic (congestive) heart failure ([place]) 04/13/2017  . Lesion of right external ear 08/12/2016  . Vitamin B12 deficiency 06/02/2016  . Carotid stenosis 02/28/2016  . Anemia, iron deficiency 02/28/2016  . Insulin dependent diabetes mellitus ([place])   . Acute renal failure (ARF) ([place])   . Sepsis ([place])   . Dilated cardiomyopathy ([place])   . Pulmonary hypertension ([place])   . Essential hypertension   . S/P CABG (coronary artery bypass graft) 12/25/2015  . S/P AVR (aortic  valve replacement) 12/25/2015  . Atrial fibrillation with RVR ([place]) 12/25/2015  . 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  . COPD (chronic obstructive pulmonary disease) ([place]) 10/21/2015  . Advanced care planning/counseling discussion 03/29/2015  . GERD (gastroesophageal reflux disease) 07/08/2014  . Paroxysmal atrial fibrillation ([place]) 07/02/2014  . Status post total replacement of left hip 06/30/2014  . Shingles 07/27/2012  . Medicare annual wellness visit, subsequent 03/08/2012  . CKD (chronic kidney disease) stage 3, GFR 30-59 ml/min ([name] 09/24/2010  . BPH (benign prostatic hypertrophy) with urinary obstruction 01/10/2010  . Bilateral hydrocele 12/18/2009  . History of CVA (cerebrovascular accident) 02/28/2009  . Insulin dependent diabetes mellitus with complications ([place]) 02/40/9735  . Diabetic neuropathy associated with type 2 diabetes mellitus ([place]) 12/17/2006  . HYPERCHOLESTEROLEMIA 12/17/2006    Orientation RESPIRATION BLADDER Height & Weight     Self, Time, Situation, Place  Normal Continent, External catheter Weight: 201 lb 1 oz (91.2 kg) Height:  5\' 9"  (175.3 cm)  BEHAVIORAL SYMPTOMS/MOOD NEUROLOGICAL BOWEL NUTRITION STATUS      Continent Diet(heart healthy)  AMBULATORY STATUS COMMUNICATION OF NEEDS Skin   Extensive Assist Verbally Normal                       Personal Care Assistance Level of Assistance  Bathing, Feeding, Dressing Bathing Assistance: Limited assistance Feeding assistance: Independent Dressing Assistance: Independent     Functional Limitations Info  Sight, Hearing, Speech Sight Info: Adequate Hearing Info: Adequate Speech Info: Adequate    SPECIAL CARE FACTORS FREQUENCY  PT (  By licensed PT), OT (By licensed OT)     PT Frequency: 5x OT Frequency: 5x            Contractures Contractures Info: Not present    Additional Factors Info  Code Status, Allergies Code Status Info: full code Allergies Info: Cyanocobalamin  Vitamin B12, Ampicillin           Current Medications (10/29/2017):  This is the current hospital active medication list Current Facility-Administered Medications  Medication Dose Route Frequency Provider Last Rate Last Dose  . acetaminophen (TYLENOL) tablet 650 mg  650 mg Oral Q6H PRN Arby Barrette A, NP   650 mg at 10/28/17 1522  . apixaban (ELIQUIS) tablet 2.5 mg  2.5 mg Oral BID Jani Gravel, MD   2.5 mg at 10/29/17 1011  . atorvastatin (LIPITOR) tablet 20 mg  20 mg Oral q1800 Jani Gravel, MD   20 mg at 10/28/17 1756  . azithromycin (ZITHROMAX) tablet 500 mg  500 mg Oral Daily Eugenie Filler, MD      . cefpodoxime Bennie Pierini) tablet 200 mg  200 mg Oral Q12H Eugenie Filler, MD      . diltiazem (CARDIZEM) tablet 30 mg  30 mg Oral BID Jani Gravel, MD   30 mg at 10/29/17 1011  . donepezil (ARICEPT) tablet 5 mg  5 mg Oral QHS Eugenie Filler, MD   5 mg at 10/28/17 2201  . famotidine (PEPCID) tablet 20 mg  20 mg Oral Daily Jani Gravel, MD   20 mg at 10/29/17 1011  . ferrous sulfate tablet 325 mg  325 mg Oral Q breakfast Jani Gravel, MD   325 mg at 10/29/17 0900  . FLUoxetine (PROZAC) capsule 40 mg  40 mg Oral Daily Eugenie Filler, MD   40 mg at 10/29/17 1011  . fluticasone (FLONASE) 50 MCG/ACT nasal spray 2 spray  2 spray Each Nare Daily Eugenie Filler, MD   2 spray at 10/29/17 1118  . furosemide (LASIX) tablet 20 mg  20 mg Oral BID Eugenie Filler, MD      . guaiFENesin [facility]) 12 hr tablet 1,200 mg  1,200 mg Oral BID Eugenie Filler, MD   1,200 mg at 10/29/17 1011  . insulin aspart (novoLOG) injection 0-15 Units  0-15 Units Subcutaneous TID WC Georgette Shell, MD   5 Units at 10/29/17 0900  . insulin aspart (novoLOG) injection 0-5 Units  0-5 Units Subcutaneous QHS Georgette Shell, MD   3 Units at 10/28/17 2159  . insulin glargine (LANTUS) injection 15 Units  15 Units Subcutaneous QHS Eugenie Filler, MD   15 Units at 10/28/17 2200  . ipratropium  (ATROVENT) nebulizer solution 0.5 mg  0.5 mg Nebulization Q2H PRN Eugenie Filler, MD      . ipratropium (ATROVENT) nebulizer solution 0.5 mg  0.5 mg Nebulization Q6H Eugenie Filler, MD      . levalbuterol Penne Lash) nebulizer solution 0.63 mg  0.63 mg Nebulization Q2H PRN Eugenie Filler, MD      . levalbuterol Penne Lash) nebulizer solution 0.63 mg  0.63 mg Nebulization Q6H Eugenie Filler, MD      . methylPREDNISolone sodium succinate (SOLU-MEDROL) 125 mg/2 mL injection 60 mg  60 mg Intravenous TID Eugenie Filler, MD      . naphazoline-glycerin (CLEAR EYES REDNESS) ophth solution 1 drop  1 drop Both Eyes QID PRN Jani Gravel, MD      . ondansetron [facility]) injection 4 mg  4 mg Intravenous Once Drenda Freeze, MD      . pantoprazole (PROTONIX) EC tablet 40 mg  40 mg Oral Q0600 Eugenie Filler, MD   40 mg at 10/29/17 8563  . phenol (CHLORASEPTIC) mouth spray 1 spray  1 spray Mouth/Throat PRN Jani Gravel, MD      . polyvinyl alcohol (LIQUIFILM TEARS) 1.4 % ophthalmic solution 1 drop  1 drop Both Eyes PRN Georgette Shell, MD   1 drop at 10/28/17 2205  . protein supplement (PREMIER PROTEIN) liquid  11 oz Oral Q24H Georgette Shell, MD   11 oz at 10/28/17 1051  . senna-docusate (Senokot-S) tablet 1 tablet  1 tablet Oral BID Eugenie Filler, MD   1 tablet at 10/29/17 1011     Discharge Medications: Please see discharge summary for a list of discharge medications.  Relevant Imaging Results:  Relevant Lab Results:   Additional Information SSN [id number]  Nila Nephew, LCSW

## 2017-10-29 NOTE — Clinical Social Work Note (Signed)
Clinical Social Work Assessment  Patient Details  Name: Eric [name] MRN: [id number] Date of Birth: [birth year]  Date of referral:  10/29/17               Reason for consult:  Facility Placement                Permission sought to share information with:  Family Supports Permission granted to share information::  Yes, Verbal Permission Granted  Name::     daughter Margarita Grizzle  Agency::     Relationship::     Contact Information:     Housing/Transportation Living arrangements for the past 2 months:  [place] of Information:  Patient, Adult Children Patient Interpreter Needed:  None Criminal Activity/Legal Involvement Pertinent to Current Situation/Hospitalization:  No - Comment as needed Significant Relationships:  Adult Children Lives with:  Self, Adult Children Do you feel safe going back to the place where you live?  Yes Need for family participation in patient care:  Yes (Comment)(asks that daughter be involved)  Care giving concerns:  Pt from home where he resides with his son. At baseline he can ambulate but "not far" and has his son usually run errands for him and manage household things. States he does still drive. Was recently in [facility] unit (09/2017) to treat depression and SI. Denies any SI today and states his mood is "better."  Facilities manager / plan:  CSW consulted to assist with SNF placement. Met with pt at bedside and daughter participated in assessment via phone. Both familiar with SNF placement process for ST rehab as pt went to [facility] for rehab on 2017 and had good experience there.  Both also in agreement this is best disposition plan for him as he is not at baseline re: ability to ambulate (See above) Completed FL2 and made referrals. Family preference is for [facility]. Will follow and assist with transition to SNF at [place].   Employment status:  Retired Forensic scientist:  [employer] PT Recommendations:  [place] / Referral to community resources:  [place]  Patient/Family's Response to care:  Appreciative of care. Pt states "it is better here than some other hospitals"  Patient/Family's Understanding of and Emotional Response to Diagnosis, Current Treatment, and Prognosis:  Pt demonstrates adequate understanding of plan. Is somewhat tangential but still appropriate. Discusses his life and career and his family. Positive and jokes appropriately with CSW. Daughter emotionally calm and appropriate as well.   Emotional Assessment Appearance:  Appears stated age Attitude/Demeanor/Rapport:  Engaged Affect (typically observed):    Orientation:  Oriented to Self, Oriented to Place, Oriented to  Time, Oriented to Situation Alcohol / Substance use:  Not Applicable Psych involvement (Current and /or in the community):  No (Comment)  Discharge Needs  Concerns to be addressed:  Discharge Planning Concerns Readmission within the last 30 days:  No Current discharge risk:  Dependent with Mobility Barriers to Discharge:  Continued Medical Work up   [employer], LCSW 10/29/2017, 12:34 PM  [phone]

## 2017-10-29 NOTE — Progress Notes (Signed)
Occupational Therapy Treatment Patient Details Name: Eric [name] MRN: [id number] DOB: March 26, 1927 Today's Date: 10/29/2017    History of present illness    84 y.o. male, w dm2, hypertension, hyperlipidemia, CAD s/p CABG, h/o endocarditis, h/o AVR, Copd, stroke with right residual, recent stay in  [facility] psych unit for dementia and SI, and now transitioned to home, and was brought 1/06/ in tonight for generalized weakness, and some confusion,  found to have CAP.   OT comments  Pt up in chair feeding himself.  He has tremors and takes his time; tremors did not interfere with self feeding. NT came at end of session; educated that she can built up surface under L elbow if tremor interferes, to decrease degrees of motion and help him stabilize  Follow Up Recommendations  SNF;Supervision/Assistance - 24 hour    Equipment Recommendations  3 in 1 bedside commode    Recommendations for Other Services      Precautions / Restrictions Precautions Precautions: Fall Precaution Comments: monitor sats, right side weaker Restrictions Weight Bearing Restrictions: No       Mobility Bed Mobility           Sit to supine: Mod assist;+2 for safety/equipment      Transfers   Equipment used: Rolling walker (2 wheeled)   Sit to Stand: Mod assist;+2 physical assistance Stand pivot transfers: Min assist;+2 safety/equipment       General transfer comment: assist to power up from recliner; min A pivotal steps to bed. +2 physical to stand and +2 safety for transfer.  Cues for UE placement    Balance                                           ADL either performed or assessed with clinical judgement   ADL   Eating/Feeding: Set up;Sitting                       Toilet Transfer: Moderate assistance;+2 for physical assistance;RW;Stand-pivot(chair to bed)             General ADL Comments: pt was up in chair, eating. He was able to manage cup with lid and straw  as well as self feed pudding with LUE without adaptation.  Pt has increased tremor on RUE but uses it as a gross assist     Vision       Perception     Praxis      Cognition Arousal/Alertness: Awake/alert Behavior During Therapy: WFL for tasks assessed/performed Overall Cognitive Status: No family/caregiver present to determine baseline cognitive functioning                                 General Comments: follows commands; pt is HOH and very talkative        Exercises     Shoulder Instructions       General Comments      Pertinent Vitals/ Pain       Pain Assessment: Faces Faces Pain Scale: Hurts a little bit Pain Location: a little bit here and a little bit there Pain Intervention(s): Limited activity within patient's tolerance;Monitored during session  Home Living  Prior Functioning/Environment              Frequency           Progress Toward Goals  OT Goals(current goals can now be found in the care plan section)  Progress towards OT goals: Progressing toward goals     Plan      Co-evaluation                 AM-PAC PT "6 Clicks" Daily Activity     Outcome Measure   Help from another person eating meals?: A Little Help from another person taking care of personal grooming?: A Little Help from another person toileting, which includes using toliet, bedpan, or urinal?: A Lot Help from another person bathing (including washing, rinsing, drying)?: A Lot Help from another person to put on and taking off regular upper body clothing?: A Lot Help from another person to put on and taking off regular lower body clothing?: A Lot 6 Click Score: 14    End of Session    OT Visit Diagnosis: Other abnormalities of gait and mobility (R26.89);Muscle weakness (generalized) (M62.81)   Activity Tolerance Patient tolerated treatment well   Patient Left in bed;with call bell/phone  within reach;with bed alarm set;with SCD's reapplied   Nurse Communication          Time: 0071-2197 OT Time Calculation (min): 29 min  Charges: OT General Charges $OT Visit: 1 Visit OT Treatments $Self Care/Home Management : 23-37 mins  Lesle Chris, OTR/L [phone] 10/29/2017   Mozell Hardacre 10/29/2017, 1:43 PM

## 2017-10-29 NOTE — Progress Notes (Signed)
Pt doesn't take HHN at home and stated that he would call if he needed a treatment. RT made the HHN PRN. Rt will continue to monitor.

## 2017-10-30 DIAGNOSIS — I480 Paroxysmal atrial fibrillation: Secondary | ICD-10-CM

## 2017-10-30 DIAGNOSIS — F0151 Vascular dementia with behavioral disturbance: Secondary | ICD-10-CM | POA: Diagnosis not present

## 2017-10-30 DIAGNOSIS — E1169 Type 2 diabetes mellitus with other specified complication: Secondary | ICD-10-CM | POA: Diagnosis not present

## 2017-10-30 DIAGNOSIS — F0390 Unspecified dementia without behavioral disturbance: Secondary | ICD-10-CM | POA: Diagnosis not present

## 2017-10-30 DIAGNOSIS — K219 Gastro-esophageal reflux disease without esophagitis: Secondary | ICD-10-CM | POA: Diagnosis not present

## 2017-10-30 DIAGNOSIS — N179 Acute kidney failure, unspecified: Secondary | ICD-10-CM | POA: Diagnosis not present

## 2017-10-30 DIAGNOSIS — E1165 Type 2 diabetes mellitus with hyperglycemia: Secondary | ICD-10-CM | POA: Diagnosis not present

## 2017-10-30 DIAGNOSIS — E43 Unspecified severe protein-calorie malnutrition: Secondary | ICD-10-CM | POA: Diagnosis not present

## 2017-10-30 DIAGNOSIS — I4891 Unspecified atrial fibrillation: Secondary | ICD-10-CM | POA: Diagnosis not present

## 2017-10-30 DIAGNOSIS — J189 Pneumonia, unspecified organism: Secondary | ICD-10-CM | POA: Diagnosis not present

## 2017-10-30 DIAGNOSIS — I25119 Atherosclerotic heart disease of native coronary artery with unspecified angina pectoris: Secondary | ICD-10-CM | POA: Diagnosis not present

## 2017-10-30 DIAGNOSIS — F0280 Dementia in other diseases classified elsewhere without behavioral disturbance: Secondary | ICD-10-CM | POA: Diagnosis not present

## 2017-10-30 DIAGNOSIS — F339 Major depressive disorder, recurrent, unspecified: Secondary | ICD-10-CM | POA: Diagnosis not present

## 2017-10-30 DIAGNOSIS — I5032 Chronic diastolic (congestive) heart failure: Secondary | ICD-10-CM | POA: Diagnosis not present

## 2017-10-30 DIAGNOSIS — E119 Type 2 diabetes mellitus without complications: Secondary | ICD-10-CM | POA: Diagnosis not present

## 2017-10-30 DIAGNOSIS — E1340 Other specified diabetes mellitus with diabetic neuropathy, unspecified: Secondary | ICD-10-CM | POA: Diagnosis not present

## 2017-10-30 DIAGNOSIS — E785 Hyperlipidemia, unspecified: Secondary | ICD-10-CM | POA: Diagnosis not present

## 2017-10-30 DIAGNOSIS — J180 Bronchopneumonia, unspecified organism: Secondary | ICD-10-CM | POA: Diagnosis not present

## 2017-10-30 DIAGNOSIS — R000 Tachycardia, unspecified: Secondary | ICD-10-CM

## 2017-10-30 DIAGNOSIS — Z8673 Personal history of transient ischemic attack (TIA), and cerebral infarction without residual deficits: Secondary | ICD-10-CM | POA: Diagnosis not present

## 2017-10-30 DIAGNOSIS — J181 Lobar pneumonia, unspecified organism: Secondary | ICD-10-CM

## 2017-10-30 DIAGNOSIS — E7800 Pure hypercholesterolemia, unspecified: Secondary | ICD-10-CM

## 2017-10-30 DIAGNOSIS — E1142 Type 2 diabetes mellitus with diabetic polyneuropathy: Secondary | ICD-10-CM

## 2017-10-30 DIAGNOSIS — J449 Chronic obstructive pulmonary disease, unspecified: Secondary | ICD-10-CM

## 2017-10-30 DIAGNOSIS — A419 Sepsis, unspecified organism: Secondary | ICD-10-CM | POA: Diagnosis not present

## 2017-10-30 DIAGNOSIS — R0989 Other specified symptoms and signs involving the circulatory and respiratory systems: Secondary | ICD-10-CM | POA: Diagnosis not present

## 2017-10-30 DIAGNOSIS — F329 Major depressive disorder, single episode, unspecified: Secondary | ICD-10-CM | POA: Diagnosis not present

## 2017-10-30 DIAGNOSIS — G9341 Metabolic encephalopathy: Secondary | ICD-10-CM | POA: Diagnosis not present

## 2017-10-30 DIAGNOSIS — Z794 Long term (current) use of insulin: Secondary | ICD-10-CM | POA: Diagnosis not present

## 2017-10-30 DIAGNOSIS — R6520 Severe sepsis without septic shock: Secondary | ICD-10-CM | POA: Diagnosis not present

## 2017-10-30 DIAGNOSIS — D509 Iron deficiency anemia, unspecified: Secondary | ICD-10-CM | POA: Diagnosis not present

## 2017-10-30 DIAGNOSIS — E1159 Type 2 diabetes mellitus with other circulatory complications: Secondary | ICD-10-CM | POA: Diagnosis not present

## 2017-10-30 DIAGNOSIS — B370 Candidal stomatitis: Secondary | ICD-10-CM | POA: Diagnosis not present

## 2017-10-30 DIAGNOSIS — J440 Chronic obstructive pulmonary disease with acute lower respiratory infection: Secondary | ICD-10-CM | POA: Diagnosis not present

## 2017-10-30 DIAGNOSIS — I10 Essential (primary) hypertension: Secondary | ICD-10-CM | POA: Diagnosis not present

## 2017-10-30 DIAGNOSIS — I5031 Acute diastolic (congestive) heart failure: Secondary | ICD-10-CM | POA: Diagnosis not present

## 2017-10-30 DIAGNOSIS — N183 Chronic kidney disease, stage 3 (moderate): Secondary | ICD-10-CM | POA: Diagnosis not present

## 2017-10-30 DIAGNOSIS — I639 Cerebral infarction, unspecified: Secondary | ICD-10-CM | POA: Diagnosis not present

## 2017-10-30 DIAGNOSIS — K5909 Other constipation: Secondary | ICD-10-CM | POA: Diagnosis not present

## 2017-10-30 LAB — CULTURE, BLOOD (ROUTINE X 2)
CULTURE: NO GROWTH
Culture: NO GROWTH
Special Requests: ADEQUATE
Special Requests: ADEQUATE

## 2017-10-30 LAB — GLUCOSE, CAPILLARY
GLUCOSE-CAPILLARY: 264 mg/dL — AB (ref 65–99)
Glucose-Capillary: 243 mg/dL — ABNORMAL HIGH (ref 65–99)
Glucose-Capillary: 370 mg/dL — ABNORMAL HIGH (ref 65–99)
Glucose-Capillary: 258 mg/dL — ABNORMAL HIGH (ref 65–99)

## 2017-10-30 LAB — BASIC METABOLIC PANEL
Anion gap: 8 (ref 5–15)
BUN: 30 mg/dL — AB (ref 6–20)
CO2: 28 mmol/L (ref 22–32)
CREATININE: 1.55 mg/dL — AB (ref 0.61–1.24)
Calcium: 8.6 mg/dL — ABNORMAL LOW (ref 8.9–10.3)
Chloride: 97 mmol/L — ABNORMAL LOW (ref 101–111)
GFR calc Af Amer: 44 mL/min — ABNORMAL LOW (ref >60)
GFR, EST NON AFRICAN AMERICAN: 38 mL/min — AB (ref >60)
GLUCOSE: 258 mg/dL — AB (ref 65–99)
Potassium: 4.3 mmol/L (ref 3.5–5.1)
SODIUM: 133 mmol/L — AB (ref 135–145)

## 2017-10-30 MED ORDER — LEVALBUTEROL HCL 0.63 MG/3ML IN NEBU: 0.6300 mg | INHALATION_SOLUTION | RESPIRATORY_TRACT | 0 refills | Status: DC | PRN

## 2017-10-30 MED ORDER — GUAIFENESIN ER 600 MG PO TB12: 1200.0000 mg | ORAL_TABLET | Freq: Two times a day (BID) | ORAL | 0 refills | Status: AC

## 2017-10-30 MED ORDER — FLUTICASONE PROPIONATE 50 MCG/ACT NA SUSP: 2.0000 | Freq: Every day | NASAL | 0 refills | Status: DC

## 2017-10-30 MED ORDER — IPRATROPIUM BROMIDE 0.02 % IN SOLN: 0.5000 mg | RESPIRATORY_TRACT | 0 refills | Status: DC | PRN

## 2017-10-30 MED ORDER — SENNOSIDES-DOCUSATE SODIUM 8.6-50 MG PO TABS: 1.0000 | ORAL_TABLET | Freq: Two times a day (BID) | ORAL | Status: DC

## 2017-10-30 MED ORDER — PREDNISONE 50 MG PO TABS: 60.0000 mg | ORAL_TABLET | Freq: Every day | ORAL | Status: DC

## 2017-10-30 MED ORDER — INSULIN GLARGINE 100 UNIT/ML ~~LOC~~ SOLN: 20.0000 [IU] | Freq: Every day | SUBCUTANEOUS | 0 refills | Status: DC

## 2017-10-30 MED ORDER — CEFPODOXIME PROXETIL 200 MG PO TABS: 200.0000 mg | ORAL_TABLET | Freq: Two times a day (BID) | ORAL | 0 refills | Status: AC

## 2017-10-30 MED ORDER — PREDNISONE 20 MG PO TABS: 20.0000 mg | ORAL_TABLET | Freq: Every day | ORAL | 0 refills | Status: DC

## 2017-10-30 MED ORDER — PREMIER PROTEIN SHAKE: 11.0000 [oz_av] | ORAL | 0 refills | Status: DC

## 2017-10-30 NOTE — Discharge Summary (Signed)
Physician Discharge Summary  Eric [name] YBO:[id number] DOB: Apr 26, 1927 DOA: 10/25/2017  PCP: [name], MD  Admit date: 10/25/2017 Discharge date: 10/30/2017  Time spent: 65 minutes  Recommendations for Outpatient Follow-up:  1. Follow-up with MD at skilled nursing facility.   Discharge Diagnoses:  Principal Problem:   Sepsis ([place]) Active Problems:   HCAP (healthcare-associated pneumonia)   Insulin dependent diabetes mellitus with complications ([place])   Diabetic neuropathy associated with type 2 diabetes mellitus ([place])   HYPERCHOLESTEROLEMIA   History of CVA (cerebrovascular accident)   CKD (chronic kidney disease) stage 3, GFR 30-59 ml/min ([name]   Paroxysmal atrial fibrillation ([name]   GERD (gastroesophageal reflux disease)   Atrial fibrillation with RVR ([name]   Acute renal failure (ARF) ([name]   COPD (chronic obstructive pulmonary disease) ([name]   Essential hypertension, benign   Major depressive disorder, single episode, severe without psychotic features ([place])   Pneumonia   Tachycardia   Anemia   Acute kidney injury superimposed on CKD ([name]   Chronic diastolic CHF (congestive heart failure) ([name]   Dementia   Acute metabolic encephalopathy   Discharge Condition: Stable and improved.  Diet recommendation: Carb modified diet.  Filed Weights   10/28/17 0956 10/29/17 0500 10/30/17 0539  Weight: 91.3 kg (201 lb 4.5 oz) 91.2 kg (201 lb 1 oz) 87.5 kg (192 lb 14.4 oz)    History of present illness:  Per Dr Clayton Bibles  is a 84 y.o. male, w dm2, hypertension, hyperlipidemia, CAD s/p CABG, h/o endocarditis, h/o AVR, Copd, apparently was recent at [facility] psych unit for dementia and now transitioned to home, and was brought in tonight for generalized weakness, and some confusion.  EMS apparently noted some wheezing and gave albuterol and atrovent.  Pt had been coughing .   In ED,  CT brain  IMPRESSION: 1. No acute intracranial  pathology.  CXR IMPRESSION: Postsurgical changes of CABG and AVR.  Mild LEFT basilar atelectasis versus infiltrate.    Hospital Course:  #1 sepsis secondary to healthcare associated pneumonia/multilobar pneumonia Patient presented with acute encephalopathy.  Patient was noted to be septic felt likely secondary to healthcare associated pneumonia/multi lobar pneumonia. Patient placed empirically on IV vancomycin, IV cefepime, IV azithromycin.  Urine pneumococcus antigen negative.  Cultures with no growth to date.  Urine cultures are negative.  MRSA PCR was negative.  Patient with poor air movement, some coarse scattered breath sounds.  Patient was placed on Mucinex as well as Flonase, PPI and a IV steroid taper due to concerns for possible bronchospasms.  Patient improved clinically.  Albuterol nebs were changed to Xopenex nebs due to A. fib with RVR.  Patient was subsequently placed on scheduled Atrovent and Xopenex nebulizers.  Patient received Lasix 20 mg IV every 12 hours x2 doses with good urine output.  Patient was subsequently transitioned to home dose oral diuretics.  IV steroids were tapered down to oral steroids and patient was discharged on a prednisone taper.  IV antibiotics were subsequently transitioned to oral Vantin and patient will be discharged on 3-4 more days of oral antibiotics to complete a course of antibiotic treatment.  Patient will be discharged on Vantin.  Patient will be discharged to a skilled nursing facility in stable and improved condition.   2.  A. fib with RVR Patient was rate controlled on home regimen of oral Cardizem.  Continued on eliquis for anticoagulation.  3. Diabetes mellitus Patient initially on admission due to his acute illness had poor oral intake.  Patient was initially placed on 32 units of Lantus as well as sliding scale insulin.  Due to patient's poor oral intake it was noted that the night of 11/06/2017, to have a CBG of 56. Hemoglobin A1c was  7.9 on February 12, 2017.  Lantus was decreased to 15 units daily and patient maintained on sliding scale insulin.  Patient needs steroids were tapered to oral steroid taper.  CBGs improved.  Patient will be discharged to a skilled nursing facility on 20 units of Lantus daily as well as his oral hypoglycemic agents.  Outpatient follow-up with MD at skilled nursing facility.   4.  Acute on chronic kidney disease stage III Likely secondary to a prerenal azotemia.  Renal function improved with hydration.  IV fluids were subsequently discontinued.  Diuretics held.  Diuretics were resumed and chronic kidney function remained stable.   5.  Chronic diastolic heart failure Patient denied any chest pain.  2D echo done 10/26/2017 with a EF of 55-60%, no wall motion abnormalities, bioprosthesis present and functioning normally, right ventricular systolic pressure was increased consistent with mild pulmonary hypertension.  Patient diuretics were initially held on admission.  Patient subsequently received Lasix 20 mg IV every 12 hours x2 doses and subsequently placed on his home regimen of oral Lasix.  Patient was discharged on home regimen of oral Lasix 20 mg twice daily.  Patient's chronic diastolic heart failure remained compensated throughout the hospitalization.   6.  Hyperlipidemia Continued on statin.  7.  Gastroesophageal reflux disease Continued on Pepcid and Protonix.  8.  Dementia Continued on home regimen of Aricept.    9.  Major depressive disorder Currently stable.  Continued on home regimen of Prozac.  10.  Hypertension Maintained on home regimen of Cardizem.  Blood pressure remained stable.   11.  Iron deficiency anemia Patient was maintained on oral iron supplementation.   12.  Metabolic encephalopathy Patient noted on admission to have presented with some confusion.  Likely secondary to pneumonia in the setting of dementia.  Mentation proved with treatment of pneumonia.  Patient's  mental status improved by day of discharge such that he was close to his baseline.        Procedures:  CT abdomen and pelvis 10/25/2017  CT chest without contrast 10/25/2017  CT head without contrast 10/25/2017  Chest x-ray 10/25/2017  2D echo 10/26/2017        Consultations:  None  Discharge Exam: Vitals:   10/29/17 2124 10/30/17 0539  BP: 122/66 129/77  Pulse: 86 80  Resp: 18 18  Temp: 98.5 F (36.9 C) 98.1 F (36.7 C)  SpO2: 93% 95%    General: NAD Cardiovascular: RRR Respiratory: CTAB  Discharge Instructions   Discharge Instructions    Diet Carb Modified   Complete by:  As directed    Increase activity slowly   Complete by:  As directed      Allergies as of 10/30/2017      Reactions   Cyanocobalamin [vitamin B12] Other (See Comments)   Leg swelling to gummy B12 vitamin   Ampicillin Rash   Skin rash      Medication List    TAKE these medications   atorvastatin 20 MG tablet Commonly known as:  LIPITOR TAKE 1 TABLET DAILY AT 6 P.M.   cefpodoxime 200 MG tablet Commonly known as:  VANTIN Take 1 tablet (200 mg total) by mouth every 12 (twelve) hours for 3 days.   Dexlansoprazole 30 MG capsule Take 30 mg by  mouth daily.   diltiazem 30 MG tablet Commonly known as:  CARDIZEM Take 1 tablet (30 mg total) by mouth 2 (two) times daily.   donepezil 5 MG tablet Commonly known as:  ARICEPT Take 5 mg by mouth at bedtime.   ELIQUIS 2.5 MG Tabs tablet Generic drug:  apixaban TAKE 1 TABLET TWICE A DAY   famotidine 20 MG tablet Commonly known as:  PEPCID Take 1 tablet (20 mg total) by mouth daily.   ferrous sulfate 325 (65 FE) MG tablet Take 1 tablet (325 mg total) by mouth daily with breakfast.   FLUoxetine 40 MG capsule Commonly known as:  PROZAC Take 40 mg by mouth daily.   fluticasone 50 MCG/ACT nasal spray Commonly known as:  FLONASE Place 2 sprays into both nostrils daily. Start taking on:  10/31/2017   furosemide 20 MG  tablet Commonly known as:  LASIX Take 20 mg by mouth 2 (two) times daily. What changed:  Another medication with the same name was removed. Continue taking this medication, and follow the directions you see here.   glipiZIDE 10 MG tablet Commonly known as:  GLUCOTROL TAKE 1 TABLET DAILY BEFORE BREAKFAST   guaiFENesin 600 MG 12 hr tablet Commonly known as:  MUCINEX Take 2 tablets (1,200 mg total) by mouth 2 (two) times daily for 3 days.   insulin glargine 100 UNIT/ML injection Commonly known as:  LANTUS Inject 0.2 mLs (20 Units total) into the skin at bedtime. What changed:  how much to take   ipratropium 0.02 % nebulizer solution Commonly known as:  ATROVENT Take 2.5 mLs (0.5 mg total) by nebulization every 2 (two) hours as needed for wheezing or shortness of breath (give with Xopenex).   levalbuterol 0.63 MG/3ML nebulizer solution Commonly known as:  XOPENEX Take 3 mLs (0.63 mg total) by nebulization every 2 (two) hours as needed for wheezing or shortness of breath (give with atrovent).   nitroGLYCERIN 0.4 MG SL tablet Commonly known as:  NITROSTAT Place 1 tablet (0.4 mg total) under the tongue every 5 (five) minutes as needed. Call MD/NP if pain unrelieved.   predniSONE 20 MG tablet Commonly known as:  DELTASONE Take 1-3 tablets (20-60 mg total) by mouth daily before breakfast. Take 3 tablets (60mg ) daily x 2 days, then 2 tablets (40mg ) daily x 3 days, then 1 tablet (20mg ) daily x 3 day then stop. Start taking on:  10/31/2017   protein supplement shake Liqd Commonly known as:  PREMIER PROTEIN Take 325 mLs (11 oz total) by mouth daily. Start taking on:  10/31/2017   senna-docusate 8.6-50 MG tablet Commonly known as:  Senokot-S Take 1 tablet by mouth 2 (two) times daily.   tetrahydrozoline-zinc 0.05-0.25 % ophthalmic solution Commonly known as:  VISINE-AC Place 2 drops into both eyes as needed (dry eyes).   traZODone 50 MG tablet Commonly known as:  DESYREL Take 50 mg  by mouth at bedtime.      Allergies  Allergen Reactions  . Cyanocobalamin [Vitamin B12] Other (See Comments)    Leg swelling to gummy B12 vitamin  . Ampicillin Rash    Skin rash    Contact information for follow-up providers    MD AT SNF Follow up.            Contact information for after-discharge care    [facility] .   Service:  Skilled Nursing Contact information: [address] [place] [phone]  The results of significant diagnostics from this hospitalization (including imaging, microbiology, ancillary and laboratory) are listed below for reference.    Significant Diagnostic Studies: Ct Abdomen Pelvis Wo Contrast  Result Date: 10/25/2017 CLINICAL DATA:  Unresolved pneumonia.  Pain. EXAM: CT CHEST, ABDOMEN AND PELVIS WITHOUT CONTRAST TECHNIQUE: Multidetector CT imaging of the chest, abdomen and pelvis was performed following the standard protocol without IV contrast. COMPARISON:  CT AP 01/30/2016 FINDINGS: CT CHEST FINDINGS Cardiovascular: Borderline cardiomegaly with native coronary arteriosclerosis. Status post aortic valvular replacement. Dense calcifications involving the mitral valve and annulus. No pericardial effusion. Status post CABG. Moderate atherosclerosis of the thoracic aorta without aneurysm. Mild atherosclerosis of the right and left subclavian arteries. Mediastinum/Nodes: No lymphadenopathy. Patent trachea and mainstem bronchi. No significant esophageal mucosal thickening. Thyroid is not enlarged. Lungs/Pleura: Trace bilateral pleural effusions with subpleural pneumonic consolidations in the posterior aspect of the left lower lobe, posterior segment of right upper lobe and periphery of the right middle lobe. No dominant mass. Mild peribronchial thickening. Musculoskeletal: The status post median sternotomy. Mild thoracic spondylosis with multilevel disc space  narrowing. No acute nor suspicious osseous abnormalities. CT ABDOMEN PELVIS FINDINGS Hepatobiliary: No focal liver abnormality is seen. Status post cholecystectomy. No biliary dilatation. Pancreas: Mild pancreatic atrophy. Spleen: No splenomegaly or mass given limitations of a noncontrast study. Adrenals/Urinary Tract: Normal bilateral adrenal glands. 14 mm exophytic hypodensity off the interpolar left kidney likely to represent a small cyst and is unchanged in appearance though slightly more conspicuous. Punctate nonobstructing renal calculus is seen within the right kidney. No hydronephrosis. Urinary bladder is unremarkable. Stomach/Bowel: Nondistended stomach with normal small bowel rotation and ligament of Treitz. Normal appendix. Descending and sigmoid diverticulosis without acute diverticulitis. Vascular/Lymphatic: Moderate aortoiliac atherosclerosis with 3.3 cm infrarenal abdominal aortic aneurysm, stable in appearance. No lymphadenopathy. Reproductive:  Enlarged prostate Other: No free air free fluid. Subcutaneous soft tissue induration along the lower abdominal wall consistent with iatrogenic delivery of subcutaneous medication. Musculoskeletal: Lumbar spondylosis with multilevel degenerative disc disease. Left hip arthroplasty. No acute nor suspicious osseous abnormalities. IMPRESSION: 1. Status post CABG with borderline cardiomegaly.  Status post AVR. 2. Trace bilateral pleural effusions with subpleural pneumonic consolidations in the posterior segment of right upper lobe, left lower lobe and along the periphery of the right middle lobe. Associated mild peribronchial thickening that may represent superimposed bronchitic change. 3. Colonic diverticulosis without acute diverticulitis. 4. Stable fusiform 3.3 cm infrarenal abdominal aortic aneurysm. 5. Thoracolumbar spondylosis. 6. Status post cholecystectomy. 7. Stable cyst off the posterior aspect of the left kidney measuring 14 mm. Punctate nonobstructing  right renal calculus. 8. Enlarged prostate, stable in appearance. Electronically Signed   By: Ashley Royalty M.D.   On: 10/25/2017 21:25   Dg Chest 2 View  Result Date: 10/25/2017 CLINICAL DATA:  Cough for 2 weeks, weakness, COPD, type II diabetes mellitus, stroke, former smoker, prior CABG EXAM: CHEST  2 VIEW COMPARISON:  10/09/2017 FINDINGS: Upper normal size of cardiac silhouette post CABG and AVR. Atherosclerotic calcification aorta. Pulmonary vascularity normal. Mild atelectasis versus infiltrate at LEFT base. Remaining lungs clear. No acute infiltrate, pleural effusion or pneumothorax. Bones appear demineralized. IMPRESSION: Postsurgical changes of CABG and AVR. Mild LEFT basilar atelectasis versus infiltrate. Electronically Signed   By: Lavonia Dana M.D.   On: 10/25/2017 16:26   Dg Chest 2 View  Result Date: 10/09/2017 CLINICAL DATA:  Patient threatened suicide EXAM: CHEST  2 VIEW COMPARISON:  10/08/2017 FINDINGS: Stable changes from cardiac surgery. Cardiac silhouette is mildly  enlarged. No mediastinal or hilar masses. No evidence of adenopathy. Clear lungs.  No pleural effusion or pneumothorax. Skeletal structures are grossly intact. IMPRESSION: No acute cardiopulmonary disease. Electronically Signed   By: Lajean Manes M.D.   On: 10/09/2017 11:09   Dg Chest 2 View  Result Date: 10/08/2017 CLINICAL DATA:  Weakness and dizziness for 2 days, history COPD, diabetes mellitus, hypertension, former smoker EXAM: CHEST  2 VIEW COMPARISON:  10/05/2017 FINDINGS: Enlargement of cardiac silhouette post CABG and AVR. Atherosclerotic calcification aorta. Mediastinal contours normal. Slight pulmonary vascular congestion Lungs grossly clear. No acute infiltrate, pleural effusion or pneumothorax. Degenerative changes of BILATERAL AC joints. IMPRESSION: Enlargement of cardiac silhouette post CABG and AVR with mild pulmonary vascular congestion. No acute abnormalities. Electronically Signed   By: Lavonia Dana M.D.    On: 10/08/2017 17:32   Dg Chest 2 View  Result Date: 10/05/2017 CLINICAL DATA:  Bilateral leg swelling. History of coronary artery disease. EXAM: CHEST  2 VIEW COMPARISON:  Radiograph 12/07/2016 FINDINGS: Post median sternotomy. Cardiomegaly is similar. Aortic arch atherosclerosis, prostatic aortic valve. Opacity abutting the right heart border is likely atelectasis. Vascular congestion without overt pulmonary edema. No pleural effusion. No pneumothorax. Degenerative change in the spine. IMPRESSION: 1. Cardiomegaly with aortic atherosclerosis. Mild vascular congestion. 2. Probable right perihilar atelectasis. Electronically Signed   By: Jeb Levering M.D.   On: 10/05/2017 02:13   Ct Head Wo Contrast  Result Date: 10/25/2017 CLINICAL DATA:  Altered mental status, possible UTI, no pain EXAM: CT HEAD WITHOUT CONTRAST TECHNIQUE: Contiguous axial images were obtained from the base of the skull through the vertex without intravenous contrast. COMPARISON:  06/11/2017 FINDINGS: Brain: No evidence of acute infarction, hemorrhage, extra-axial collection, ventriculomegaly, or mass effect. Chronic right parietooccipital lobe infarct with encephalomalacia. Generalized cerebral atrophy. Periventricular white matter low attenuation likely secondary to microangiopathy. Vascular: Cerebrovascular atherosclerotic calcifications are noted. Skull: Negative for fracture or focal lesion. Sinuses/Orbits: Visualized portions of the orbits are unremarkable. Visualized portions of the paranasal sinuses and mastoid air cells are unremarkable. Other: None. IMPRESSION: 1. No acute intracranial pathology. Electronically Signed   By: Kathreen Devoid   On: 10/25/2017 16:33   Ct Chest Wo Contrast  Result Date: 10/25/2017 CLINICAL DATA:  Unresolved pneumonia.  Pain. EXAM: CT CHEST, ABDOMEN AND PELVIS WITHOUT CONTRAST TECHNIQUE: Multidetector CT imaging of the chest, abdomen and pelvis was performed following the standard protocol without  IV contrast. COMPARISON:  CT AP 01/30/2016 FINDINGS: CT CHEST FINDINGS Cardiovascular: Borderline cardiomegaly with native coronary arteriosclerosis. Status post aortic valvular replacement. Dense calcifications involving the mitral valve and annulus. No pericardial effusion. Status post CABG. Moderate atherosclerosis of the thoracic aorta without aneurysm. Mild atherosclerosis of the right and left subclavian arteries. Mediastinum/Nodes: No lymphadenopathy. Patent trachea and mainstem bronchi. No significant esophageal mucosal thickening. Thyroid is not enlarged. Lungs/Pleura: Trace bilateral pleural effusions with subpleural pneumonic consolidations in the posterior aspect of the left lower lobe, posterior segment of right upper lobe and periphery of the right middle lobe. No dominant mass. Mild peribronchial thickening. Musculoskeletal: The status post median sternotomy. Mild thoracic spondylosis with multilevel disc space narrowing. No acute nor suspicious osseous abnormalities. CT ABDOMEN PELVIS FINDINGS Hepatobiliary: No focal liver abnormality is seen. Status post cholecystectomy. No biliary dilatation. Pancreas: Mild pancreatic atrophy. Spleen: No splenomegaly or mass given limitations of a noncontrast study. Adrenals/Urinary Tract: Normal bilateral adrenal glands. 14 mm exophytic hypodensity off the interpolar left kidney likely to represent a small cyst and is unchanged in  appearance though slightly more conspicuous. Punctate nonobstructing renal calculus is seen within the right kidney. No hydronephrosis. Urinary bladder is unremarkable. Stomach/Bowel: Nondistended stomach with normal small bowel rotation and ligament of Treitz. Normal appendix. Descending and sigmoid diverticulosis without acute diverticulitis. Vascular/Lymphatic: Moderate aortoiliac atherosclerosis with 3.3 cm infrarenal abdominal aortic aneurysm, stable in appearance. No lymphadenopathy. Reproductive:  Enlarged prostate Other: No free  air free fluid. Subcutaneous soft tissue induration along the lower abdominal wall consistent with iatrogenic delivery of subcutaneous medication. Musculoskeletal: Lumbar spondylosis with multilevel degenerative disc disease. Left hip arthroplasty. No acute nor suspicious osseous abnormalities. IMPRESSION: 1. Status post CABG with borderline cardiomegaly.  Status post AVR. 2. Trace bilateral pleural effusions with subpleural pneumonic consolidations in the posterior segment of right upper lobe, left lower lobe and along the periphery of the right middle lobe. Associated mild peribronchial thickening that may represent superimposed bronchitic change. 3. Colonic diverticulosis without acute diverticulitis. 4. Stable fusiform 3.3 cm infrarenal abdominal aortic aneurysm. 5. Thoracolumbar spondylosis. 6. Status post cholecystectomy. 7. Stable cyst off the posterior aspect of the left kidney measuring 14 mm. Punctate nonobstructing right renal calculus. 8. Enlarged prostate, stable in appearance. Electronically Signed   By: Ashley Royalty M.D.   On: 10/25/2017 21:25    Microbiology: Recent Results (from the past 240 hour(s))  Blood culture (routine x 2)     Status: None   Collection Time: 10/25/17  4:34 PM  Result Value Ref Range Status   Specimen Description BLOOD RIGHT ANTECUBITAL  Final   Special Requests   Final    BOTTLES DRAWN AEROBIC AND ANAEROBIC Blood Culture adequate volume   Culture   Final    NO GROWTH 5 DAYS Performed at [facility], [address] [place], [place] [address]    Report Status 10/30/2017 FINAL  Final  Blood culture (routine x 2)     Status: None   Collection Time: 10/25/17  4:40 PM  Result Value Ref Range Status   Specimen Description BLOOD LEFT ANTECUBITAL  Final   Special Requests   Final    BOTTLES DRAWN AEROBIC AND ANAEROBIC Blood Culture adequate volume   Culture   Final    NO GROWTH 5 DAYS Performed at [facility], [place] [address], [place], [place]  [id number]    Report Status 10/30/2017 FINAL  Final  Urine culture     Status: None   Collection Time: 10/25/17  4:50 PM  Result Value Ref Range Status   Specimen Description URINE, CATHETERIZED  Final   Special Requests NONE  Final   Culture   Final    NO GROWTH Performed at [facility], [place] [address], [place], [place] [address]    Report Status 10/26/2017 FINAL  Final  MRSA PCR Screening     Status: None   Collection Time: 10/26/17  1:00 AM  Result Value Ref Range Status   MRSA by PCR NEGATIVE NEGATIVE Final    Comment:        The GeneXpert MRSA Assay (FDA approved for NASAL specimens only), is one component of a comprehensive MRSA colonization surveillance program. It is not intended to diagnose MRSA infection nor to guide or monitor treatment for MRSA infections.   Culture, blood (routine x 2) Call MD if unable to obtain prior to antibiotics being given     Status: None (Preliminary result)   Collection Time: 10/26/17  2:15 AM  Result Value Ref Range Status   Specimen Description BLOOD  RIGHT HAND  Final   Special Requests IN PEDIATRIC BOTTLE Blood Culture adequate volume  Final   Culture   Final    NO GROWTH 4 DAYS Performed at [facility], [place] [address] [place], [place] [address]    Report Status PENDING  Incomplete  Culture, blood (routine x 2) Call MD if unable to obtain prior to antibiotics being given     Status: None (Preliminary result)   Collection Time: 10/26/17  2:15 AM  Result Value Ref Range Status   Specimen Description BLOOD LEFT HAND  Final   Special Requests   Final    IN PEDIATRIC BOTTLE Blood Culture results may not be optimal due to an excessive volume of blood received in culture bottles   Culture   Final    NO GROWTH 4 DAYS Performed at [facility], [place] [address], [place], [place] [address]    Report Status PENDING  Incomplete     Labs: Basic Metabolic Panel: Recent Labs  Lab 10/26/17 0750 10/27/17 0303  10/28/17 0256 10/29/17 0306 10/30/17 0448  NA 137 139 137 134* 133*  K 4.6 3.6 3.9 4.2 4.3  CL 106 109 106 99* 97*  CO2 24 25 26 24 28   GLUCOSE 300* 167* 204* 246* 258*  BUN 23* 16 18 25* 30*  CREATININE 1.83* 1.48* 1.46* 1.56* 1.55*  CALCIUM 8.0* 8.1* 8.5* 8.6* 8.6*   Liver Function Tests: Recent Labs  Lab 10/25/17 1640 10/26/17 0750  AST 21 14*  ALT 9* 8*  ALKPHOS 81 66  BILITOT 1.2 0.6  PROT 7.8 6.4*  ALBUMIN 3.3* 2.6*   No results for input(s): LIPASE, AMYLASE in the last 168 hours. No results for input(s): AMMONIA in the last 168 hours. CBC: Recent Labs  Lab 10/25/17 1640 10/26/17 0750 10/27/17 0303 10/28/17 0256 10/29/17 0306  WBC 10.4 7.6 6.8 7.0 5.4  NEUTROABS 7.9*  --   --   --  4.9  HGB 12.8* 10.5* 10.5* 10.3* 10.6*  HCT 39.5 32.4* 33.3* 33.0* 32.5*  MCV 95.9 96.1 97.4 97.3 93.4  PLT 363 298 307 303 303   Cardiac Enzymes: Recent Labs  Lab 10/26/17 0215 10/26/17 1332 10/26/17 1813  TROPONINI <0.03 0.03* <0.03   BNP: BNP (last 3 results) Recent Labs    10/05/17 0127  BNP 101.0*    ProBNP (last 3 results) No results for input(s): PROBNP in the last 8760 hours.  CBG: Recent Labs  Lab 10/29/17 1217 10/29/17 1757 10/29/17 2131 10/30/17 0823 10/30/17 1203  GLUCAP 182* 194* 204* 264* 243*       Signed:  Irine Seal MD.  [facility] 10/30/2017, 4:58 PM

## 2017-10-30 NOTE — Progress Notes (Signed)
Report given to [facility] at [place].  Awaiting PTAR pickup. Andre Lefort

## 2017-10-30 NOTE — Progress Notes (Addendum)
CSW following for disposition. Plan for SNF at [facility]. Pt' family had selected [facility] as 2nd choice. Unable to reach [facility] to confirm bed availability for pt today after several attempts. Eric [name] has no male beds available today or over the weekend. Left voicemail with daughter Eric [name] to discuss other bed offers (confirmed that other offers from [facility], [facility], and [facility] have beds available today).  Disposition: pending. Awaiting SNF selection/bed availability.  Eric [name], MSW, LCSW Clinical Social Work 10/30/2017 [phone]  04:54- Pt selects [facility] for rehab- confirmed with admissions bed is available for him at [facility] today. Left voicemails for pt's son Eric [name] and daughter Eric [name] (numbers listed on facesheet)- awaiting call back to update them. Reached son Eric [name]- informed him of pt's choice to go to [facility]- he is agreeable and states he will inform his siblings.

## 2017-10-30 NOTE — Progress Notes (Signed)
[facility] is here to transport patient to [facility] SN[facility].  Patient is to go to room 128-A.

## 2017-10-30 NOTE — Care Management Note (Signed)
Case Management Note  Patient Details  Name: Eric [name] MRN: [id number] Date of Birth: 03-Jan-1927  Subjective/Objective:                    Action/Plan:   Expected Discharge Date:  (unknown)               Expected Discharge Plan:  Skilled Nursing Facility  In-House Referral:  Clinical Social Work  Discharge planning Services  CM Consult  Post Acute Care Choice:    Choice offered to:     DME Arranged:    DME Agency:     HH Arranged:    [place] Agency:     Status of Service:  In process, will continue to follow  If discussed at Long Length of Stay Meetings, dates discussed:    Additional CommentsPurcell Mouton, RN 10/30/2017, 4:05 PM

## 2017-10-30 NOTE — Progress Notes (Signed)
Inpatient Diabetes Program Recommendations  AACE/ADA: New Consensus Statement on Inpatient Glycemic Control (2015)  Target Ranges:  Prepandial:   less than 140 mg/dL      Peak postprandial:   less than 180 mg/dL (1-2 hours)      Critically ill patients:  140 - 180 mg/dL   Lab Results  Component Value Date   GLUCAP 264 (H) 10/30/2017   HGBA1C 7.9 (H) 02/12/2017    Review of Glycemic Control Results for Eric [name], Eric [name] (MRN [id number]) as of 10/30/2017 09:51  Ref. Range 10/29/2017 17:57 10/29/2017 21:31 10/30/2017 04:48 10/30/2017 08:23  Glucose-Capillary Latest Ref Range: 65 - 99 mg/dL 194 (H) 204 (H)  264 (H)   Diabetes history: Type 2 DM Outpatient Diabetes medications: Glipizide 10 mg in AM, Lantus 29 Units QHS Current orders for Inpatient glycemic control: Novolog 0-15 Units TIDWC, Novolog 0-5 Units QHS, Lantus 15 Units QHS  Inpatient Diabetes Program Recommendations:    Patient usually takes Lantus 29 Units QHS, with that was having lows here inpatient. So, provider decreased Lantus to 15 Units QHS and there was improvement to hypoglycemic episodes. However, Solumedrol 60 mg Q12H was initiatied and I would anticipate continued increases to blood sugar.   Would recommend increasing Lantus to 20 Units QHS and Novolog 3 Units TIDWC and can adjust insulin needs once Solumedrol is decreased.   Thanks, Bronson Curb, MSN, RNC-OB Diabetes Coordinator [phone] (8a-5p)

## 2017-10-30 NOTE — Clinical Social Work Placement (Signed)
Pt discharged with plan to admit to [facility] SNF room 128-A Report [phone] Arranged PTAR transport Spoke with pt's son Ena Dawley - aware and agreeable to plan- states he will inform pt's daughter and other sons today. See below for placement information    CLINICAL SOCIAL WORK PLACEMENT  NOTE  Date:  10/30/2017  Patient Details  Name: Eric [name] MRN: [id number] Date of Birth: Mar 13, 1927  Clinical Social Work is seeking post-discharge placement for this patient at the [place] level of care (*CSW will initial, date and re-position this form in  chart as items are completed):  Yes   Patient/family provided with [place] Work Department's list of facilities offering this level of care within the geographic area requested by the patient (or if unable, by the patient's family).  Yes   Patient/family informed of their freedom to choose among providers that offer the needed level of care, that participate in Medicare, Medicaid or managed care program needed by the patient, have an available bed and are willing to accept the patient.  Yes   Patient/family informed of [facility]'s ownership interest in [facility] and [facility], as well as of the fact that they are under no obligation to receive care at these facilities.  PASRR submitted to EDS on       PASRR number received on       Existing PASRR number confirmed on 10/29/17     FL2 transmitted to all facilities in geographic area requested by pt/family on 10/29/17     FL2 transmitted to all facilities within larger geographic area on       Patient informed that his/her managed care company has contracts with or will negotiate with certain facilities, including the following:        Yes   Patient/family informed of bed offers received.  Patient chooses bed at [facility]     Physician recommends and patient chooses bed at [facility]    Patient to be transferred to [facility]  on  .  Patient to be transferred to facility by       Patient family notified on   of transfer.  Name of family member notified:  daughter Margarita Grizzle     PHYSICIAN       Additional Comment:    _______________________________________________ Nila Nephew, LCSW 10/30/2017, 5:28 PM

## 2017-10-31 LAB — CULTURE, BLOOD (ROUTINE X 2)
CULTURE: NO GROWTH
CULTURE: NO GROWTH
SPECIAL REQUESTS: ADEQUATE

## 2017-11-02 ENCOUNTER — Encounter: Payer: Self-pay | Admitting: Adult Health

## 2017-11-02 ENCOUNTER — Non-Acute Institutional Stay (SKILLED_NURSING_FACILITY): Payer: Medicare Other | Admitting: Adult Health

## 2017-11-02 DIAGNOSIS — E1169 Type 2 diabetes mellitus with other specified complication: Secondary | ICD-10-CM | POA: Diagnosis not present

## 2017-11-02 DIAGNOSIS — I639 Cerebral infarction, unspecified: Secondary | ICD-10-CM

## 2017-11-02 DIAGNOSIS — I5032 Chronic diastolic (congestive) heart failure: Secondary | ICD-10-CM | POA: Diagnosis not present

## 2017-11-02 DIAGNOSIS — I25119 Atherosclerotic heart disease of native coronary artery with unspecified angina pectoris: Secondary | ICD-10-CM | POA: Diagnosis not present

## 2017-11-02 DIAGNOSIS — F0151 Vascular dementia with behavioral disturbance: Secondary | ICD-10-CM

## 2017-11-02 DIAGNOSIS — N183 Chronic kidney disease, stage 3 unspecified: Secondary | ICD-10-CM

## 2017-11-02 DIAGNOSIS — K219 Gastro-esophageal reflux disease without esophagitis: Secondary | ICD-10-CM | POA: Diagnosis not present

## 2017-11-02 DIAGNOSIS — I4891 Unspecified atrial fibrillation: Secondary | ICD-10-CM | POA: Diagnosis not present

## 2017-11-02 DIAGNOSIS — I2510 Atherosclerotic heart disease of native coronary artery without angina pectoris: Secondary | ICD-10-CM | POA: Insufficient documentation

## 2017-11-02 DIAGNOSIS — K5909 Other constipation: Secondary | ICD-10-CM | POA: Insufficient documentation

## 2017-11-02 DIAGNOSIS — E785 Hyperlipidemia, unspecified: Secondary | ICD-10-CM

## 2017-11-02 DIAGNOSIS — F01518 Vascular dementia, unspecified severity, with other behavioral disturbance: Secondary | ICD-10-CM | POA: Insufficient documentation

## 2017-11-02 DIAGNOSIS — J449 Chronic obstructive pulmonary disease, unspecified: Secondary | ICD-10-CM

## 2017-11-02 DIAGNOSIS — D509 Iron deficiency anemia, unspecified: Secondary | ICD-10-CM | POA: Diagnosis not present

## 2017-11-02 DIAGNOSIS — I209 Angina pectoris, unspecified: Secondary | ICD-10-CM
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
## 2017-11-05 ENCOUNTER — Encounter: Payer: Self-pay | Admitting: Internal Medicine

## 2017-11-05 ENCOUNTER — Non-Acute Institutional Stay (SKILLED_NURSING_FACILITY): Payer: Medicare Other | Admitting: Internal Medicine

## 2017-11-05 DIAGNOSIS — E1165 Type 2 diabetes mellitus with hyperglycemia: Secondary | ICD-10-CM

## 2017-11-05 DIAGNOSIS — Z794 Long term (current) use of insulin: Secondary | ICD-10-CM

## 2017-11-05 DIAGNOSIS — E43 Unspecified severe protein-calorie malnutrition: Secondary | ICD-10-CM | POA: Diagnosis not present

## 2017-11-05 DIAGNOSIS — I480 Paroxysmal atrial fibrillation: Secondary | ICD-10-CM | POA: Diagnosis not present

## 2017-11-05 DIAGNOSIS — Z8673 Personal history of transient ischemic attack (TIA), and cerebral infarction without residual deficits: Secondary | ICD-10-CM

## 2017-11-05 DIAGNOSIS — B370 Candidal stomatitis: Secondary | ICD-10-CM

## 2017-11-05 NOTE — Progress Notes (Signed)
Patient ID: Eric [name], male   DOB: [birth year], 84 y.o.   MRN: [id number]   Provider:  DR Arletha Grippe Location:  [facility] Room Number: 128 A Place of Service:  SNF (31)  PCP: [name], MD Patient Care Team: [name], MD as PCP - General (Family Medicine) Stanford Breed Denice Bors, MD as PCP - Cardiology (Cardiology)  Extended Emergency Contact Information Primary Emergency Contact: Baugher,Joel Address: [address]          [place],  [address] Johnnette Litter of [place] Phone: [phone] Mobile Phone: [phone] Relation: Son Secondary Emergency Contact: Russell of [place] Mobile Phone: [phone] Relation: Son  Code Status: DNR Goals of Care: Advanced Directive information Advanced Directives 11/05/2017  Does Patient Have a Medical Advance Directive? Yes  Type of Advance Directive Out of facility DNR (pink MOST or yellow form)  Does patient want to make changes to medical advance directive? No - Patient declined  Copy of [place] in Chart? -  Would patient like information on creating a medical advance directive? No - Patient declined  Pre-existing out of facility DNR order (yellow form or pink MOST form) Yellow form placed in chart (order not valid for inpatient use)      Chief Complaint  Patient presents with  . New Admit To SNF    Admission    HPI: Patient is a 84 y.o. male seen today for admission to SNF following hospital stay for sepsis, HCAP,  AKI, afib with RVR, tachycardia, dementia, DM, hx CVA, hyperlipidemia, COPD, HTN. In the ED he was placed on IV vanco, cefepime and azithromycin. He was given neb tx's. Cr 1.55; Hgb 10.6; albumin 2.6. He presents to SNF for short term rehab.  Today he c/o weakness, numbness in his feet and thrush. Nursing reports oral thrush. CBG 300-400. No low BS reactions. Appetite ok and sleeps well. No falls. He is on prednisone  taper and vantin  PAF - rate controlled on Cardizem 30 mg twice daily; eliquis 2.5 mg twice daily for anticoagulation  Chronic diastolic heart failure - stable. EF 55-60% ( 10-26-17); takes lasix 20 mg twice daily  Dyslipidemia - stable on lipitor 20 mg daily   CAD, native coronary artery - s/p CABG; hx acute endocarditis. He is asymptomatic and takes prn SLNTG  Hx CVA - stable. takes long term eliquis 2.54 mg twice daily   COPD - stable on prn atrovent neb and xopenex neb every 2 hours as needed; mucinex 1200 mg twice daily and flonase daily   GERD without esophagitis - stable on pepcid 20 mg daily and prilosec 20 mg daily   DM - borderline controlled. A1c 7.9%; takes lantus 20 units nightly and glipizide 10 mg daily. No low BS reactions. CBG >300 most days.  Vascular dementia with behavioral disturbance -stable on aricept 5 mg nightly   CKD - stage 3. Cr1.56  Iron deficiency anemia - stable on iron daily. Hgb 10.5  MDD single episode, severe, without psychotic features - mood stable on prozac 40 mg daily and trazodone 50 mg nightly   Chronic constipation - stable on senna s twice daily   Past Medical History:  Diagnosis Date  . Acute endocarditis   . Atrial fibrillation ([place])   . Bilateral hydrocele 3/11   [facility]  . Cerebrovascular disease, unspecified   . Cervicalgia   . Colon polyp   . COPD (chronic obstructive pulmonary disease) ([facility]) 2017  dx during hospitalization  . Coronary atherosclerosis of unspecified type of vessel, native or graft    s/p CABG 2010  . Diabetes mellitus without complication ([facility])   . Esophageal reflux   . Essential hypertension, benign   . Foraminal stenosis of cervical region    bilateral-C4-C5, C5-C6  . History of aortic stenosis    s/p valve replacment 2010  . Pure hypercholesterolemia   . Shingles 07/27/2012  . Stroke ([facility])   . Type II or unspecified type diabetes mellitus with neurological manifestations, not stated as  uncontrolled(250.60)   . 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 AORTIC VALVE REPLACEMENT  12/2008   with pericardial tissue valve  . CARDIAC SURGERY    . Carotid US  10/2009   B ICA stenosis, stable disease, rec rpt 2 years  . CATARACT EXTRACTION  1990's   bilateral  . CHOLECYSTECTOMY  1995  . COLONOSCOPY  [facility]  . CORONARY ARTERY BYPASS GRAFT  3/10   x3-using a left internal mammary artery to the left anterior descending coronary artery, saphenous vein graft to circumflex marginal branch, spahenous vein graft  to posterior descendingcoronary artery. Endoscopic saphenous vein harvest from bilateral thighs was done.   Marland Kitchen HYDROCELE EXCISION     bilateral ([facility])  . L leg trauma  1957   truck over left leg  . PTCA  12/99   with stent  . sphincterectomy  09/20/03   for jaundice  . TEE WITHOUT CARDIOVERSION N/A 01/09/2016   Procedure: TRANSESOPHAGEAL ECHOCARDIOGRAM (TEE);  Surgeon: Dorothy Spark, MD;  Location: [facility];  Service: Cardiovascular;  Laterality: N/A;  . TEE WITHOUT CARDIOVERSION N/A 01/15/2016   Procedure: TRANSESOPHAGEAL ECHOCARDIOGRAM (TEE);  Surgeon: Larey Dresser, MD;  Location: [facility];  Service: Cardiovascular;  Laterality: N/A;  . TOTAL HIP ARTHROPLASTY Left 06/30/2014   Procedure: LEFT TOTAL HIP ARTHROPLASTY ANTERIOR APPROACH;  Surgeon: Mcarthur Rossetti, MD;  Location: [facility];  Service: Orthopedics;  Laterality: Left;    reports that he quit smoking about 24 years ago. His smoking use included cigarettes. He has a 50.00 pack-year smoking history. he has never used smokeless tobacco. He reports that he drinks about 0.6 oz of alcohol per week. He reports that he does not use drugs. Social History   Socioeconomic History  . Marital status: Widowed    Spouse name: Not on file  . Number of children: 3  .  Years of education: Not on file  . Highest education level: Not on file  Social Needs  . Financial resource strain: Not on file  . Food insecurity - worry: Not on file  . Food insecurity - inability: Not on file  . Transportation needs - medical: Not on file  . Transportation needs - non-medical: Not on file  Occupational History  . Occupation: retired-truck Geophysicist/field seismologist  Tobacco Use  . Smoking status: Former Smoker    Packs/day: 1.00    Years: 50.00    Pack years: 50.00    Types: Cigarettes    Last attempt to quit: 10/20/1993    Years since quitting: 24.0  . Smokeless tobacco: Never Used  Substance and Sexual Activity  . Alcohol use: Yes    Alcohol/week: 0.6 oz    Types: 1 Cans of beer per week    Comment: occasional  . Drug use: No  . Sexual activity:  No  Other Topics Concern  . Not on file  Social History Narrative  . Not on file    Functional Status Survey:    Family History  Problem Relation Age of Onset  . Heart attack Father 53  . Breast cancer Mother 8  . Brain cancer Sister   . Prostate cancer Brother   . Diabetes Neg Hx   . Stroke Neg Hx     Health Maintenance  Topic Date Due  . OPHTHALMOLOGY EXAM  02/16/2018 (Originally 11/27/1936)  . INFLUENZA VACCINE  10/29/2018 (Originally 05/20/2017)  . HEMOGLOBIN A1C  11/08/2018 (Originally 08/14/2017)  . DTaP/Tdap/Td (1 - Tdap) 11/20/2020 (Originally 11/22/2000)  . TETANUS/TDAP  11/20/2020 (Originally 11/21/2010)  . URINE MICROALBUMIN  02/12/2018  . FOOT EXAM  02/19/2018  . PNA vac Low Risk Adult  Completed    Allergies  Allergen Reactions  . Cyanocobalamin [Vitamin B12] Other (See Comments)    Leg swelling to gummy B12 vitamin  . Ampicillin Rash    Skin rash    Outpatient Encounter Medications as of 11/05/2017  Medication Sig  . Amino Acids-Protein Hydrolys (FEEDING SUPPLEMENT, PRO-STAT SUGAR FREE 64,) LIQD Take 30 mLs by mouth daily.  Marland Kitchen atorvastatin (LIPITOR) 20 MG tablet TAKE 1 TABLET DAILY AT 6 P.M.  .  diltiazem (CARDIZEM) 30 MG tablet Take 1 tablet (30 mg total) by mouth 2 (two) times daily.  Marland Kitchen donepezil (ARICEPT) 5 MG tablet Take 5 mg by mouth at bedtime.  Marland Kitchen ELIQUIS 2.5 MG TABS tablet TAKE 1 TABLET TWICE A DAY  . famotidine (PEPCID) 20 MG tablet Take 1 tablet (20 mg total) by mouth daily.  . ferrous sulfate 325 (65 FE) MG tablet Take 1 tablet (325 mg total) by mouth daily with breakfast.  . FLUoxetine (PROZAC) 40 MG capsule Take 40 mg by mouth daily.  . fluticasone (FLONASE) 50 MCG/ACT nasal spray Place 2 sprays into both nostrils daily.  . furosemide (LASIX) 20 MG tablet Take 20 mg by mouth 2 (two) times daily.  Marland Kitchen glipiZIDE (GLUCOTROL) 10 MG tablet TAKE 1 TABLET DAILY BEFORE BREAKFAST  . insulin glargine (LANTUS) 100 UNIT/ML injection Inject 0.2 mLs (20 Units total) into the skin at bedtime.  Marland Kitchen ipratropium (ATROVENT) 0.02 % nebulizer solution Take 2.5 mLs (0.5 mg total) by nebulization every 2 (two) hours as needed for wheezing or shortness of breath (give with Xopenex).  Marland Kitchen levalbuterol (XOPENEX) 0.63 MG/3ML nebulizer solution Take 3 mLs (0.63 mg total) by nebulization every 2 (two) hours as needed for wheezing or shortness of breath (give with atrovent).  . nitroGLYCERIN (NITROSTAT) 0.4 MG SL tablet Place 1 tablet (0.4 mg total) under the tongue every 5 (five) minutes as needed. Call MD/NP if pain unrelieved.  . Nutritional Supplements (NUTRITIONAL SUPPLEMENT PO) HSG CCD NAS Diet - HSG Puree texture, Regular Consistency  . omeprazole (PRILOSEC) 20 MG capsule Take 20 mg by mouth daily.  . predniSONE (DELTASONE) 20 MG tablet Take 1-3 tablets (20-60 mg total) by mouth daily before breakfast. Take 3 tablets (60mg ) daily x 2 days, then 2 tablets (40mg ) daily x 3 days, then 1 tablet (20mg ) daily x 3 day then stop.  Marland Kitchen senna-docusate (SENOKOT-S) 8.6-50 MG tablet Take 1 tablet by mouth 2 (two) times daily.  Marland Kitchen tetrahydrozoline-zinc (VISINE-AC) 0.05-0.25 % ophthalmic solution Place 2 drops into both eyes  as needed (dry eyes).   . traZODone (DESYREL) 50 MG tablet Take 50 mg by mouth at bedtime.   No facility-administered encounter medications on file  as of 11/05/2017.     Review of Systems  Unable to perform ROS: Dementia    Vitals:   11/05/17 0956  BP: 136/66  Pulse: 68  Resp: 16  Temp: 98.7 F (37.1 C)  SpO2: 98%  Weight: 180 lb (81.6 kg)  Height: 5\' 9"  (1.753 m)   Body mass index is 26.58 kg/m. Physical Exam  Constitutional: He appears well-developed.  Frail appearing in NAD sitting up in bed  HENT:  Mouth/Throat: Oropharynx is clear and moist.  (+) oral thrush - tongue white plaques; MMM  Eyes: Pupils are equal, round, and reactive to light. No scleral icterus.  Neck: Neck supple. Carotid bruit is not present. No thyromegaly present.  Cardiovascular: Normal rate, regular rhythm and intact distal pulses. Exam reveals no gallop and no friction rub.  Murmur (1/6 SEM) heard. Trace LE edema b/l. No calf TTP  Pulmonary/Chest: Effort normal and breath sounds normal. He has no wheezes. He has no rales. He exhibits no tenderness.  Reduced BS at base left  Abdominal: Soft. Normal appearance and bowel sounds are normal. He exhibits distension. He exhibits no abdominal bruit, no pulsatile midline mass and no mass. There is no hepatomegaly. There is tenderness (generally). There is no rigidity, no rebound and no guarding. No hernia.  obese  Musculoskeletal: He exhibits edema (left leg swelling with reduced ROM) and tenderness.  Lymphadenopathy:    He has no cervical adenopathy.  Neurological: He is alert.  Skin: Skin is warm and dry. No rash noted.  Psychiatric: He has a normal mood and affect. His behavior is normal. Thought content normal.   Diabetic Foot Exam - Simple   Simple Foot Form Diabetic Foot exam was performed with the following findings:  Yes 11/05/2017  3:51 PM  Visual Inspection See comments:  Yes Sensation Testing Pulse Check Posterior Tibialis and Dorsalis  pulse intact bilaterally:  Yes Comments B/l large bunion with reduced ROM; hammer toes present      Labs reviewed: Basic Metabolic Panel: Recent Labs    10/28/17 0256 10/29/17 0306 10/30/17 0448  NA 137 134* 133*  K 3.9 4.2 4.3  CL 106 99* 97*  CO2 26 24 28   GLUCOSE 204* 246* 258*  BUN 18 25* 30*  CREATININE 1.46* 1.56* 1.55*  CALCIUM 8.5* 8.6* 8.6*   Liver Function Tests: Recent Labs    10/08/17 0937 10/25/17 1640 10/26/17 0750  AST 18 21 14*  ALT 11* 9* 8*  ALKPHOS 66 81 66  BILITOT 1.6* 1.2 0.6  PROT 6.3* 7.8 6.4*  ALBUMIN 3.5 3.3* 2.6*   No results for input(s): LIPASE, AMYLASE in the last 8760 hours. No results for input(s): AMMONIA in the last 8760 hours. CBC: Recent Labs    10/05/17 0127  10/25/17 1640  10/27/17 0303 10/28/17 0256 10/29/17 0306  WBC 5.2   < > 10.4   < > 6.8 7.0 5.4  NEUTROABS 3.7  --  7.9*  --   --   --  4.9  HGB 11.8*   < > 12.8*   < > 10.5* 10.3* 10.6*  HCT 36.8*   < > 39.5   < > 33.3* 33.0* 32.5*  MCV 95.6   < > 95.9   < > 97.4 97.3 93.4  PLT 160   < > 363   < > 307 303 303   < > = values in this interval not displayed.   Cardiac Enzymes: Recent Labs    10/26/17 0215 10/26/17 1332 10/26/17  [place] <0.03 0.03* <0.03   BNP: Invalid input(s): POCBNP Lab Results  Component Value Date   HGBA1C 7.9 (H) 02/12/2017   Lab Results  Component Value Date   TSH 1.046 10/26/2017   Lab Results  Component Value Date   VITAMINB12 180 (L) 02/12/2017   Lab Results  Component Value Date   FOLATE >23.7 05/29/2016   Lab Results  Component Value Date   IRON 38 (L) 05/29/2016   FERRITIN 89.9 05/29/2016    Imaging and Procedures obtained prior to SNF admission: Ct Abdomen Pelvis Wo Contrast  Result Date: 10/25/2017 CLINICAL DATA:  Unresolved pneumonia.  Pain. EXAM: CT CHEST, ABDOMEN AND PELVIS WITHOUT CONTRAST TECHNIQUE: Multidetector CT imaging of the chest, abdomen and pelvis was performed following the standard  protocol without IV contrast. COMPARISON:  CT AP 01/30/2016 FINDINGS: CT CHEST FINDINGS Cardiovascular: Borderline cardiomegaly with native coronary arteriosclerosis. Status post aortic valvular replacement. Dense calcifications involving the mitral valve and annulus. No pericardial effusion. Status post CABG. Moderate atherosclerosis of the thoracic aorta without aneurysm. Mild atherosclerosis of the right and left subclavian arteries. Mediastinum/Nodes: No lymphadenopathy. Patent trachea and mainstem bronchi. No significant esophageal mucosal thickening. Thyroid is not enlarged. Lungs/Pleura: Trace bilateral pleural effusions with subpleural pneumonic consolidations in the posterior aspect of the left lower lobe, posterior segment of right upper lobe and periphery of the right middle lobe. No dominant mass. Mild peribronchial thickening. Musculoskeletal: The status post median sternotomy. Mild thoracic spondylosis with multilevel disc space narrowing. No acute nor suspicious osseous abnormalities. CT ABDOMEN PELVIS FINDINGS Hepatobiliary: No focal liver abnormality is seen. Status post cholecystectomy. No biliary dilatation. Pancreas: Mild pancreatic atrophy. Spleen: No splenomegaly or mass given limitations of a noncontrast study. Adrenals/Urinary Tract: Normal bilateral adrenal glands. 14 mm exophytic hypodensity off the interpolar left kidney likely to represent a small cyst and is unchanged in appearance though slightly more conspicuous. Punctate nonobstructing renal calculus is seen within the right kidney. No hydronephrosis. Urinary bladder is unremarkable. Stomach/Bowel: Nondistended stomach with normal small bowel rotation and ligament of Treitz. Normal appendix. Descending and sigmoid diverticulosis without acute diverticulitis. Vascular/Lymphatic: Moderate aortoiliac atherosclerosis with 3.3 cm infrarenal abdominal aortic aneurysm, stable in appearance. No lymphadenopathy. Reproductive:  Enlarged  prostate Other: No free air free fluid. Subcutaneous soft tissue induration along the lower abdominal wall consistent with iatrogenic delivery of subcutaneous medication. Musculoskeletal: Lumbar spondylosis with multilevel degenerative disc disease. Left hip arthroplasty. No acute nor suspicious osseous abnormalities. IMPRESSION: 1. Status post CABG with borderline cardiomegaly.  Status post AVR. 2. Trace bilateral pleural effusions with subpleural pneumonic consolidations in the posterior segment of right upper lobe, left lower lobe and along the periphery of the right middle lobe. Associated mild peribronchial thickening that may represent superimposed bronchitic change. 3. Colonic diverticulosis without acute diverticulitis. 4. Stable fusiform 3.3 cm infrarenal abdominal aortic aneurysm. 5. Thoracolumbar spondylosis. 6. Status post cholecystectomy. 7. Stable cyst off the posterior aspect of the left kidney measuring 14 mm. Punctate nonobstructing right renal calculus. 8. Enlarged prostate, stable in appearance. Electronically Signed   By: Ashley Royalty M.D.   On: 10/25/2017 21:25   Dg Chest 2 View  Result Date: 10/25/2017 CLINICAL DATA:  Cough for 2 weeks, weakness, COPD, type II diabetes mellitus, stroke, former smoker, prior CABG EXAM: CHEST  2 VIEW COMPARISON:  10/09/2017 FINDINGS: Upper normal size of cardiac silhouette post CABG and AVR. Atherosclerotic calcification aorta. Pulmonary vascularity normal. Mild atelectasis versus infiltrate at LEFT base. Remaining lungs clear. No acute  infiltrate, pleural effusion or pneumothorax. Bones appear demineralized. IMPRESSION: Postsurgical changes of CABG and AVR. Mild LEFT basilar atelectasis versus infiltrate. Electronically Signed   By: Lavonia Dana M.D.   On: 10/25/2017 16:26   Ct Head Wo Contrast  Result Date: 10/25/2017 CLINICAL DATA:  Altered mental status, possible UTI, no pain EXAM: CT HEAD WITHOUT CONTRAST TECHNIQUE: Contiguous axial images were obtained  from the base of the skull through the vertex without intravenous contrast. COMPARISON:  06/11/2017 FINDINGS: Brain: No evidence of acute infarction, hemorrhage, extra-axial collection, ventriculomegaly, or mass effect. Chronic right parietooccipital lobe infarct with encephalomalacia. Generalized cerebral atrophy. Periventricular white matter low attenuation likely secondary to microangiopathy. Vascular: Cerebrovascular atherosclerotic calcifications are noted. Skull: Negative for fracture or focal lesion. Sinuses/Orbits: Visualized portions of the orbits are unremarkable. Visualized portions of the paranasal sinuses and mastoid air cells are unremarkable. Other: None. IMPRESSION: 1. No acute intracranial pathology. Electronically Signed   By: Kathreen Devoid   On: 10/25/2017 16:33   Ct Chest Wo Contrast  Result Date: 10/25/2017 CLINICAL DATA:  Unresolved pneumonia.  Pain. EXAM: CT CHEST, ABDOMEN AND PELVIS WITHOUT CONTRAST TECHNIQUE: Multidetector CT imaging of the chest, abdomen and pelvis was performed following the standard protocol without IV contrast. COMPARISON:  CT AP 01/30/2016 FINDINGS: CT CHEST FINDINGS Cardiovascular: Borderline cardiomegaly with native coronary arteriosclerosis. Status post aortic valvular replacement. Dense calcifications involving the mitral valve and annulus. No pericardial effusion. Status post CABG. Moderate atherosclerosis of the thoracic aorta without aneurysm. Mild atherosclerosis of the right and left subclavian arteries. Mediastinum/Nodes: No lymphadenopathy. Patent trachea and mainstem bronchi. No significant esophageal mucosal thickening. Thyroid is not enlarged. Lungs/Pleura: Trace bilateral pleural effusions with subpleural pneumonic consolidations in the posterior aspect of the left lower lobe, posterior segment of right upper lobe and periphery of the right middle lobe. No dominant mass. Mild peribronchial thickening. Musculoskeletal: The status post median sternotomy.  Mild thoracic spondylosis with multilevel disc space narrowing. No acute nor suspicious osseous abnormalities. CT ABDOMEN PELVIS FINDINGS Hepatobiliary: No focal liver abnormality is seen. Status post cholecystectomy. No biliary dilatation. Pancreas: Mild pancreatic atrophy. Spleen: No splenomegaly or mass given limitations of a noncontrast study. Adrenals/Urinary Tract: Normal bilateral adrenal glands. 14 mm exophytic hypodensity off the interpolar left kidney likely to represent a small cyst and is unchanged in appearance though slightly more conspicuous. Punctate nonobstructing renal calculus is seen within the right kidney. No hydronephrosis. Urinary bladder is unremarkable. Stomach/Bowel: Nondistended stomach with normal small bowel rotation and ligament of Treitz. Normal appendix. Descending and sigmoid diverticulosis without acute diverticulitis. Vascular/Lymphatic: Moderate aortoiliac atherosclerosis with 3.3 cm infrarenal abdominal aortic aneurysm, stable in appearance. No lymphadenopathy. Reproductive:  Enlarged prostate Other: No free air free fluid. Subcutaneous soft tissue induration along the lower abdominal wall consistent with iatrogenic delivery of subcutaneous medication. Musculoskeletal: Lumbar spondylosis with multilevel degenerative disc disease. Left hip arthroplasty. No acute nor suspicious osseous abnormalities. IMPRESSION: 1. Status post CABG with borderline cardiomegaly.  Status post AVR. 2. Trace bilateral pleural effusions with subpleural pneumonic consolidations in the posterior segment of right upper lobe, left lower lobe and along the periphery of the right middle lobe. Associated mild peribronchial thickening that may represent superimposed bronchitic change. 3. Colonic diverticulosis without acute diverticulitis. 4. Stable fusiform 3.3 cm infrarenal abdominal aortic aneurysm. 5. Thoracolumbar spondylosis. 6. Status post cholecystectomy. 7. Stable cyst off the posterior aspect of the  left kidney measuring 14 mm. Punctate nonobstructing right renal calculus. 8. Enlarged prostate, stable in appearance. Electronically Signed  By: Ashley Royalty M.D.   On: 10/25/2017 21:25    Assessment/Plan   ICD-10-CM   1. Oral candidiasis B37.0   2. Type 2 diabetes mellitus with hyperglycemia, with long-term current use of insulin ([name] E11.65    Z79.4   3. Severe protein-calorie malnutrition ([place]) E43   4. PAF (paroxysmal atrial fibrillation) ([name] I48.0   5. History of CVA in adulthood Z86.73      Increase lantus to 26 units qhs  START CLOTRIMAZOLE TROCHES DISSOLVE 1 ON TONGUE 5 TIMES DAILY X 7 DAYS  Cont other meds as ordered  PT/OT/ST as ordered  Wound care as directed  Cont nutritional supplements as ordered  GOAL: short term rehab and d/c home when medically appropriate. Communicated with pt and nursing.  Will follow  Labs/tests ordered: none   Real Cona S. Perlie Gold  [facility] [address] [place], [place] [address] [phone] Cell (Monday-Friday 8 AM - 5 PM) [phone] After 5 PM and follow prompts

## 2017-11-11 ENCOUNTER — Non-Acute Institutional Stay (SKILLED_NURSING_FACILITY): Payer: Medicare Other | Admitting: Adult Health

## 2017-11-11 ENCOUNTER — Encounter: Payer: Self-pay | Admitting: Adult Health

## 2017-11-11 DIAGNOSIS — J440 Chronic obstructive pulmonary disease with acute lower respiratory infection: Secondary | ICD-10-CM
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
## 2017-11-12 ENCOUNTER — Non-Acute Institutional Stay (SKILLED_NURSING_FACILITY): Payer: Medicare Other | Admitting: Adult Health

## 2017-11-12 ENCOUNTER — Encounter: Payer: Self-pay | Admitting: Adult Health

## 2017-11-12 DIAGNOSIS — J440 Chronic obstructive pulmonary disease with acute lower respiratory infection: Secondary | ICD-10-CM | POA: Diagnosis not present
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
## 2017-11-14 ENCOUNTER — Other Ambulatory Visit: Payer: Self-pay | Admitting: Family Medicine

## 2017-11-18 IMAGING — CT CT HEAD W/O CM
2 series · 15 of 30 positions shown, 17 images · non-contrast
Comparison: None.

CLINICAL DATA: 89-year-old with several recent falls. Confusion and
left hip pain.

EXAM:
CT HEAD WITHOUT CONTRAST
TECHNIQUE: Contiguous axial images were obtained from the base of the skull
through the vertex without intravenous contrast.

[Series 3: head bone · axial · 0.48mm/px · z∈[-90,+24]mm · 8 of 73 slices shown]
[im 8/73  bone]
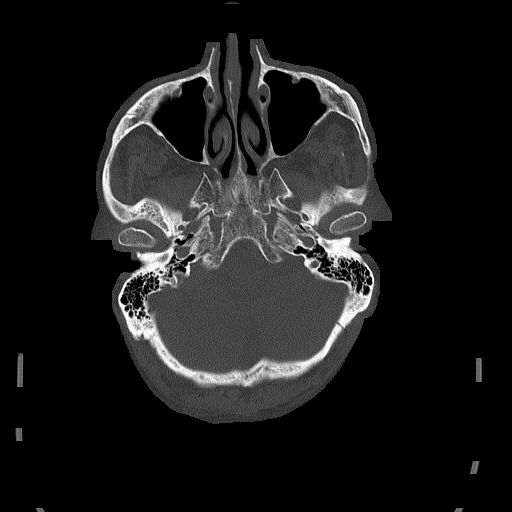
[im 15/73  bone]
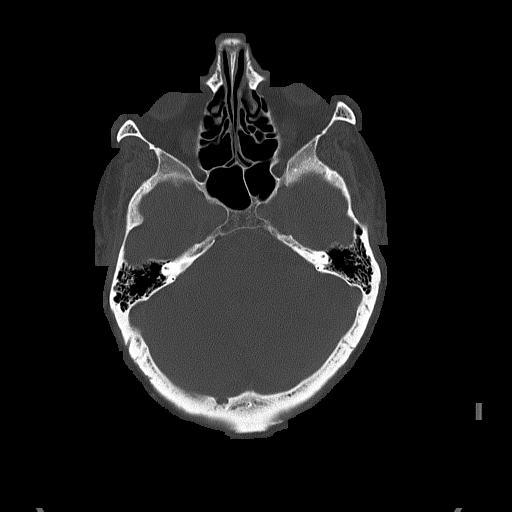
[im 22/73  bone]
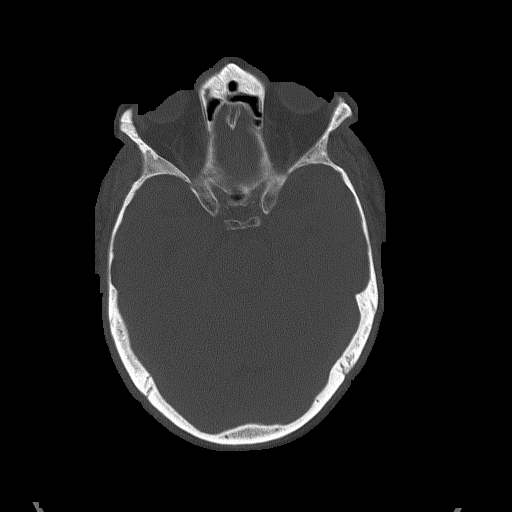
[im 33/73  bone]
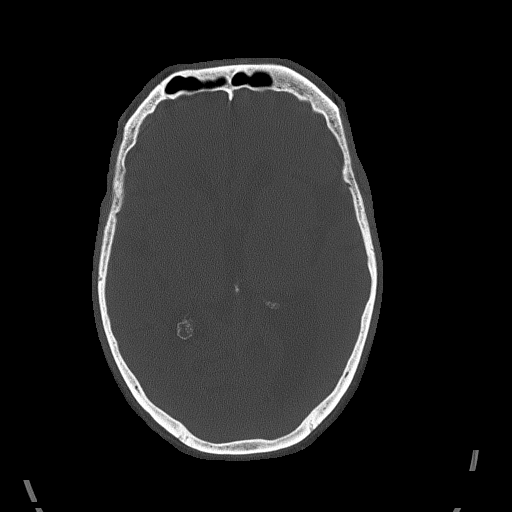
[im 40/73  bone]
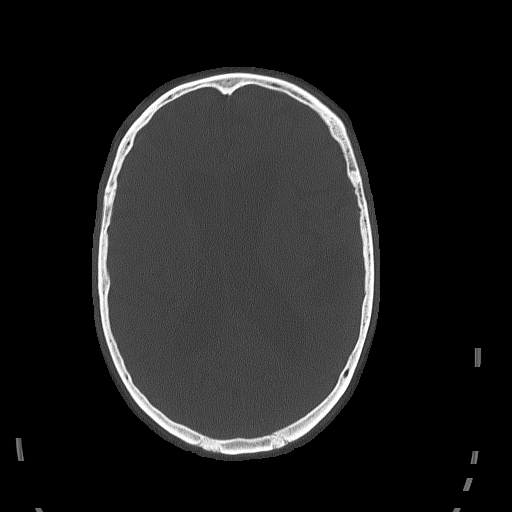
[im 51/73  bone]
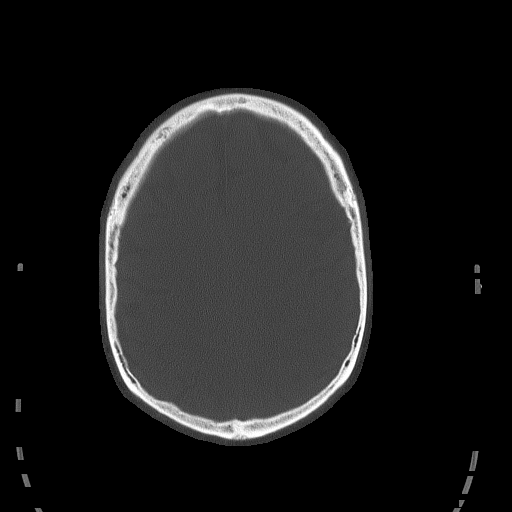
[im 58/73  bone]
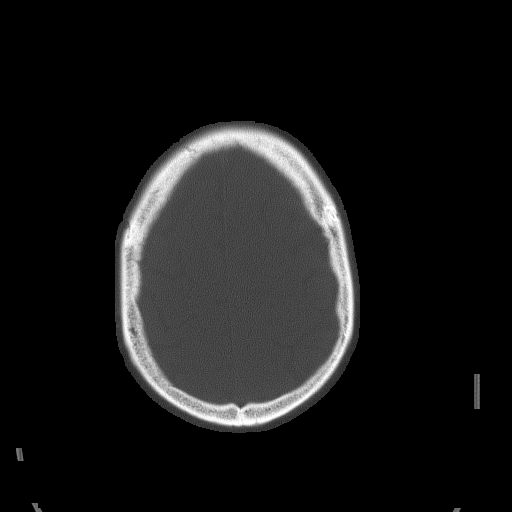
[im 65/73  bone]
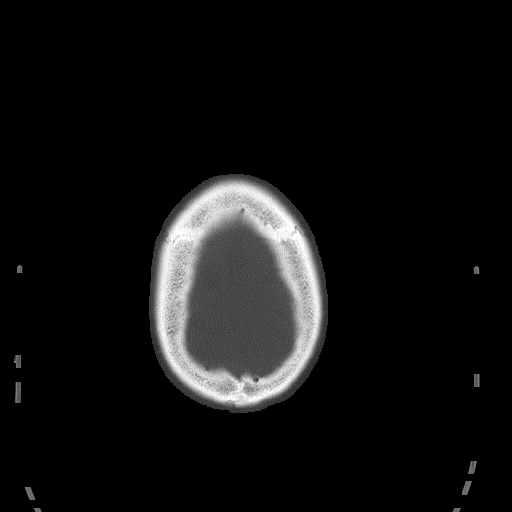

[Series 4: head without · axial · non-contrast · 0.48mm/px · z∈[-89,+16]mm · 7 of 29 slices shown, 9 images]
[im 4/29  brain]
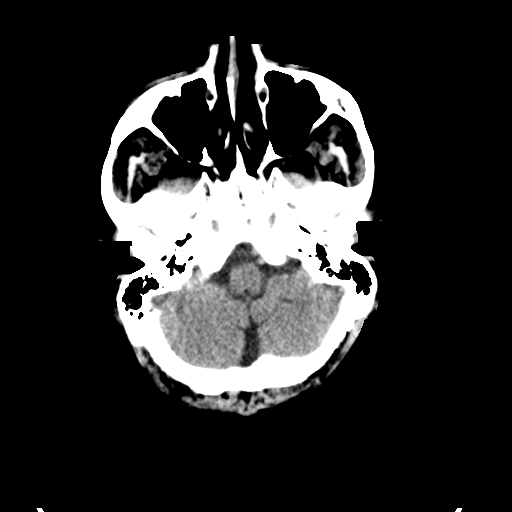
[im 4/29  bone]
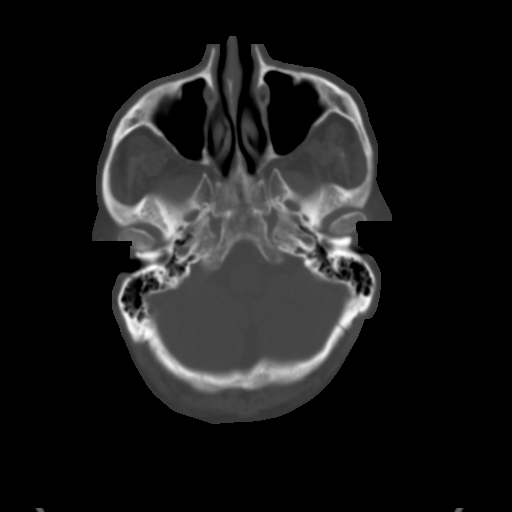
[im 8/29  brain]
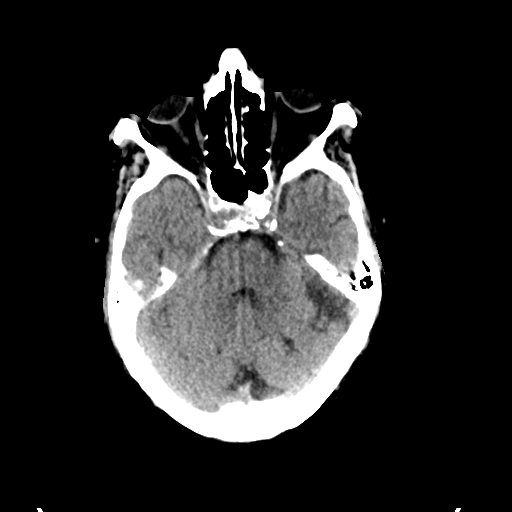
[im 11/29  brain]
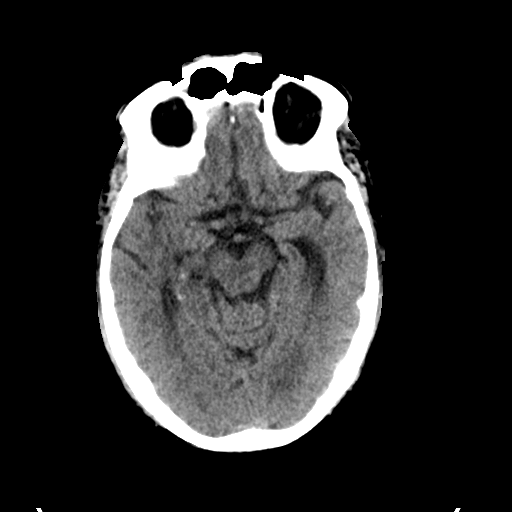
[im 15/29  brain]
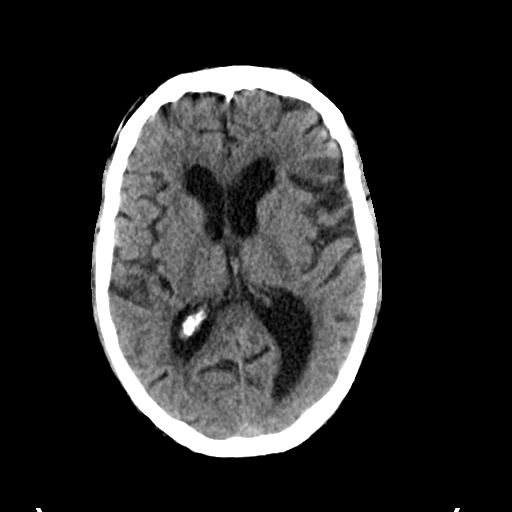
[im 18/29  brain]
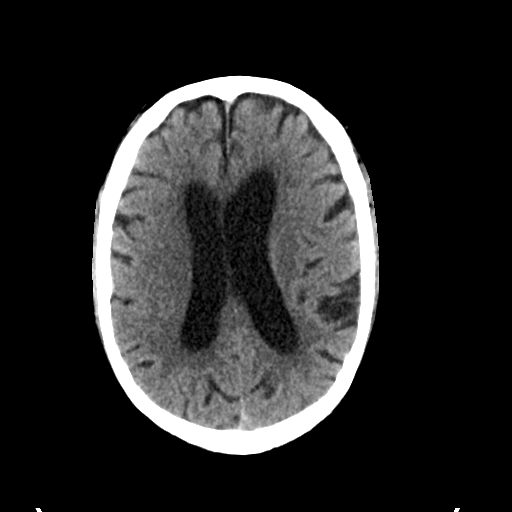
[im 18/29  bone]
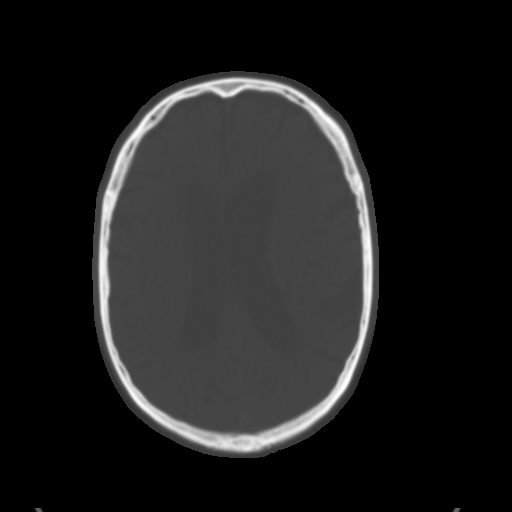
[im 22/29  brain]
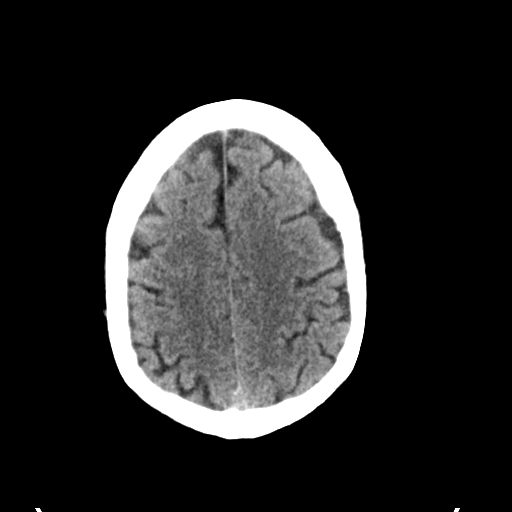
[im 25/29  brain]
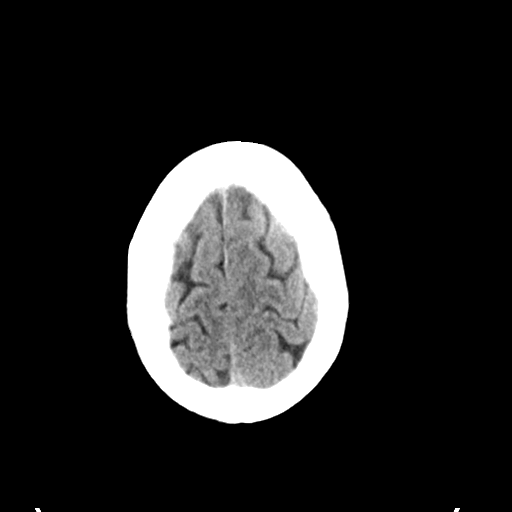

[15 of 30 positions shown; findings below may reference images not displayed]

FINDINGS: Brain: There is no evidence of acute intracranial hemorrhage, mass
lesion, brain edema or extra-axial fluid collection. The ventricles
and subarachnoid spaces are appropriately sized for age. There is no
CT evidence of acute cortical infarction. There is low-density
within the periventricular white matter, likely secondary to chronic
small vessel ischemic changes. Intracranial vascular calcifications
are present.

Bones/sinuses/visualized face: The visualized paranasal sinuses,
mastoid air cells and middle ears are clear. The calvarium is
intact.
IMPRESSION: No acute intracranial findings. Age-appropriate atrophy and mild
periventricular white matter disease, likely due to chronic small
vessel ischemic changes.

## 2017-11-18 IMAGING — CR DG CHEST 1V PORT
1 series · 1 of 1 positions shown · non-contrast
Comparison: 12/25/2015 at [DATE] p.m..

CLINICAL DATA: Sudden on set of expiratory wheezing post CT scan.
Pt is here for A-fib, SOB, and generalized weakness

EXAM:
PORTABLE CHEST 1 VIEW

[AP]
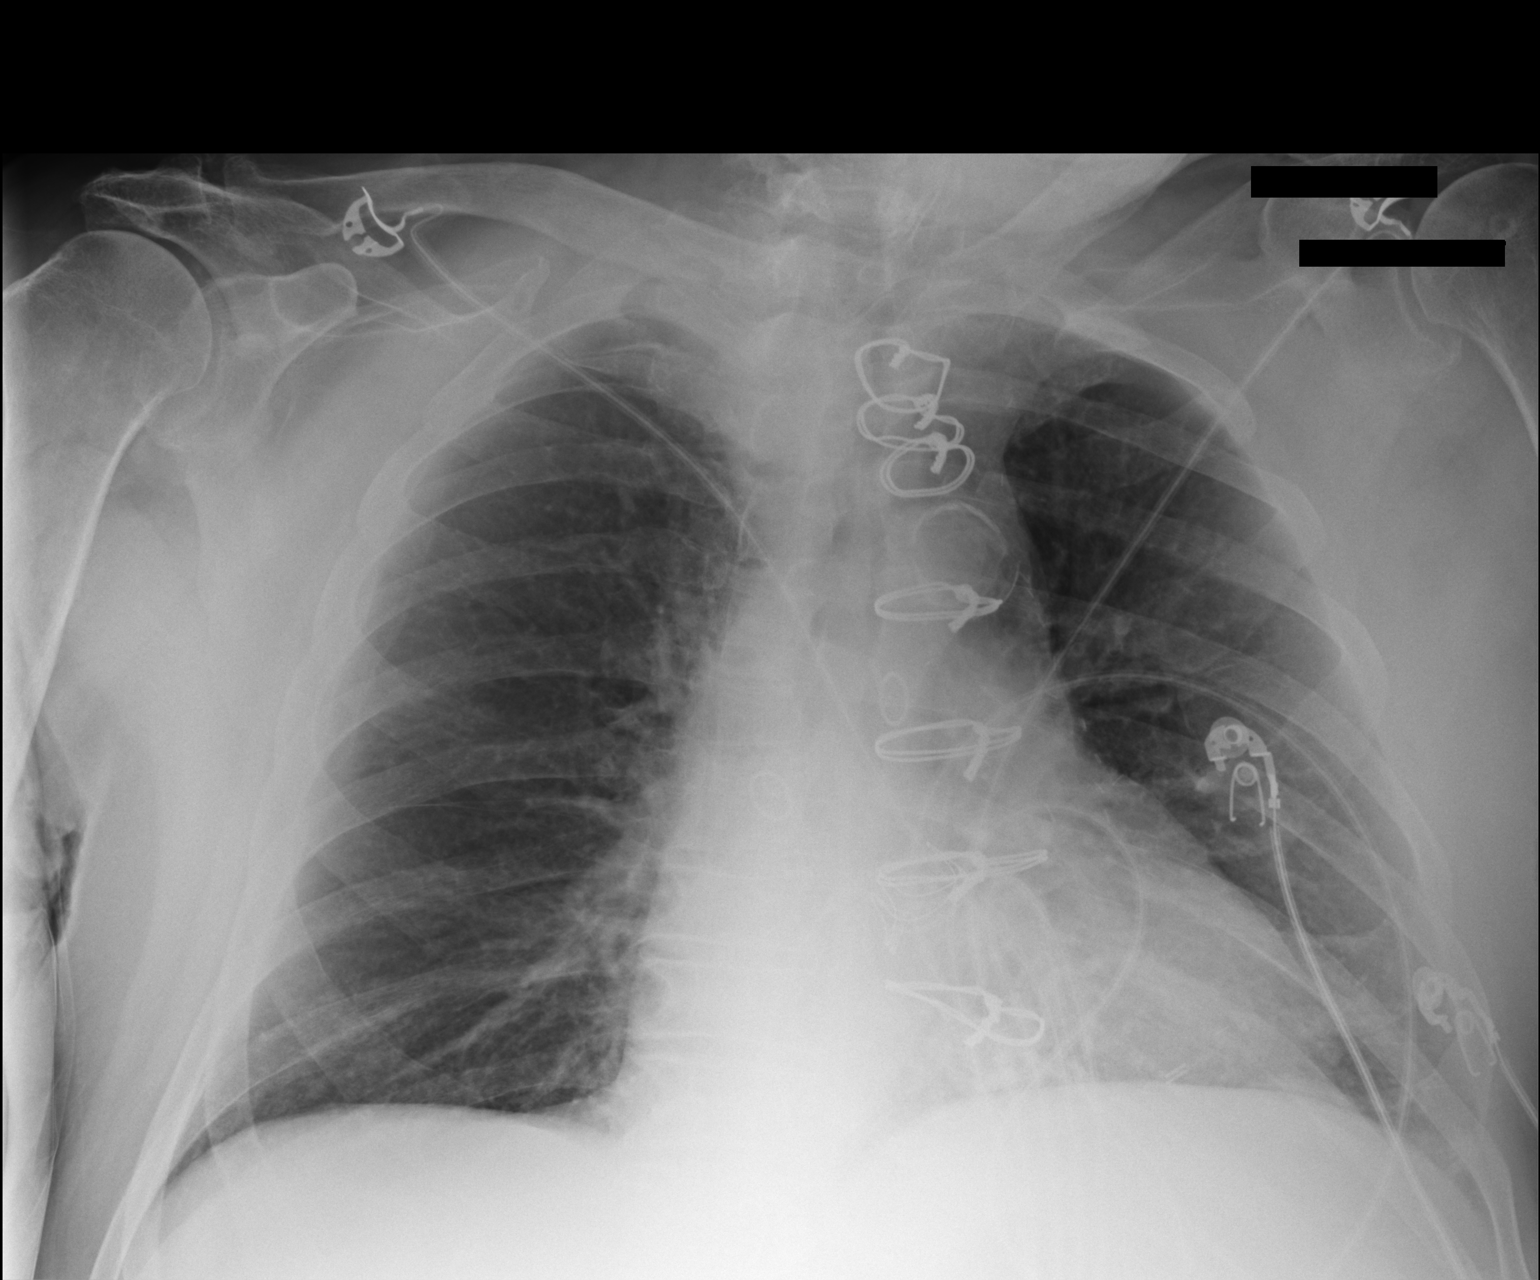

[1 of 1 positions shown; findings below may reference images not displayed]

FINDINGS: Changes from cardiac surgery and valve replacement are stable. No
mediastinal or hilar masses or evidence of adenopathy.

Clear lungs.  No pleural effusion or pneumothorax.

Bony thorax is grossly intact.
IMPRESSION: No acute cardiopulmonary disease.

## 2017-11-19 ENCOUNTER — Non-Acute Institutional Stay (SKILLED_NURSING_FACILITY): Payer: Medicare Other | Admitting: Internal Medicine

## 2017-11-19 DIAGNOSIS — F01518 Vascular dementia, unspecified severity, with other behavioral disturbance: Secondary | ICD-10-CM

## 2017-11-19 DIAGNOSIS — F0151 Vascular dementia with behavioral disturbance: Secondary | ICD-10-CM

## 2017-11-19 DIAGNOSIS — R4589 Other symptoms and signs involving emotional state: Secondary | ICD-10-CM

## 2017-11-19 DIAGNOSIS — F329 Major depressive disorder, single episode, unspecified: Secondary | ICD-10-CM

## 2017-11-19 IMAGING — CR DG ABD PORTABLE 1V
1 series · 1 of 1 positions shown · non-contrast
Comparison: Abdominal radiograph 12/24/2008

CLINICAL DATA: Hematemesis.

EXAM:
PORTABLE ABDOMEN - 1 VIEW

[AP]
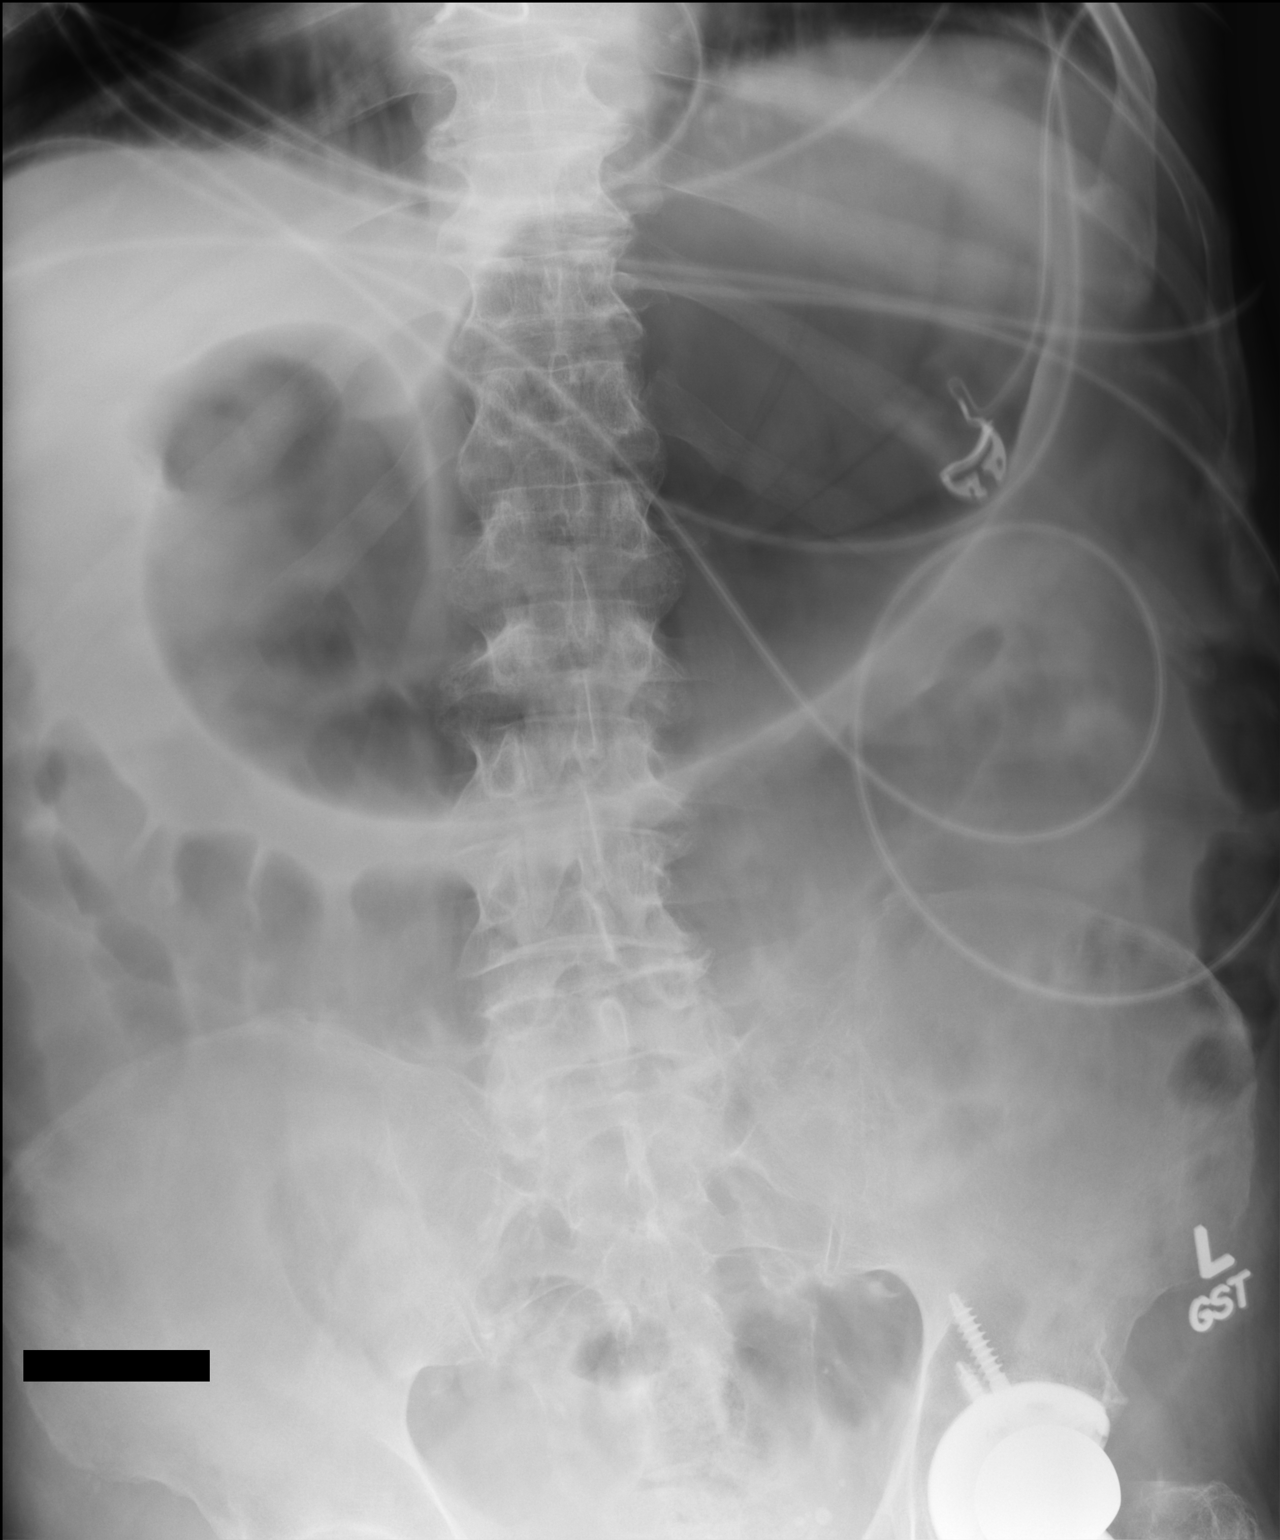

[1 of 1 positions shown; findings below may reference images not displayed]

FINDINGS: There is gaseous gastric distension. Air-filled bowel in the central
abdomen likely transverse colon. No definite small bowel dilatation.
Limited assessment for free air given portable imaging. Right
abdomen and lower most pelvis not included in the field of view.
IMPRESSION: Gaseous gastric distention.  No definite small bowel dilatation.

## 2017-11-20 DIAGNOSIS — R634 Abnormal weight loss: Secondary | ICD-10-CM | POA: Diagnosis not present

## 2017-11-20 DIAGNOSIS — F5105 Insomnia due to other mental disorder: Secondary | ICD-10-CM | POA: Diagnosis not present

## 2017-11-20 DIAGNOSIS — Z87891 Personal history of nicotine dependence: Secondary | ICD-10-CM | POA: Diagnosis not present

## 2017-11-20 DIAGNOSIS — Z96642 Presence of left artificial hip joint: Secondary | ICD-10-CM | POA: Diagnosis not present

## 2017-11-20 DIAGNOSIS — Z7901 Long term (current) use of anticoagulants: Secondary | ICD-10-CM | POA: Diagnosis not present

## 2017-11-20 DIAGNOSIS — F0390 Unspecified dementia without behavioral disturbance: Secondary | ICD-10-CM | POA: Diagnosis present

## 2017-11-20 DIAGNOSIS — S0990XA Unspecified injury of head, initial encounter: Secondary | ICD-10-CM | POA: Diagnosis not present

## 2017-11-20 DIAGNOSIS — S0181XA Laceration without foreign body of other part of head, initial encounter: Secondary | ICD-10-CM | POA: Diagnosis not present

## 2017-11-20 DIAGNOSIS — F0150 Vascular dementia without behavioral disturbance: Secondary | ICD-10-CM | POA: Diagnosis not present

## 2017-11-20 DIAGNOSIS — Z794 Long term (current) use of insulin: Secondary | ICD-10-CM | POA: Diagnosis not present

## 2017-11-20 DIAGNOSIS — N183 Chronic kidney disease, stage 3 (moderate): Secondary | ICD-10-CM | POA: Diagnosis present

## 2017-11-20 DIAGNOSIS — R05 Cough: Secondary | ICD-10-CM | POA: Diagnosis not present

## 2017-11-20 DIAGNOSIS — I2510 Atherosclerotic heart disease of native coronary artery without angina pectoris: Secondary | ICD-10-CM | POA: Diagnosis not present

## 2017-11-20 DIAGNOSIS — M25511 Pain in right shoulder: Secondary | ICD-10-CM | POA: Diagnosis not present

## 2017-11-20 DIAGNOSIS — I679 Cerebrovascular disease, unspecified: Secondary | ICD-10-CM | POA: Diagnosis not present

## 2017-11-20 DIAGNOSIS — J189 Pneumonia, unspecified organism: Secondary | ICD-10-CM | POA: Diagnosis not present

## 2017-11-20 DIAGNOSIS — E876 Hypokalemia: Secondary | ICD-10-CM | POA: Diagnosis not present

## 2017-11-20 DIAGNOSIS — F0151 Vascular dementia with behavioral disturbance: Secondary | ICD-10-CM | POA: Diagnosis not present

## 2017-11-20 DIAGNOSIS — E7800 Pure hypercholesterolemia, unspecified: Secondary | ICD-10-CM | POA: Diagnosis not present

## 2017-11-20 DIAGNOSIS — Y939 Activity, unspecified: Secondary | ICD-10-CM | POA: Diagnosis not present

## 2017-11-20 DIAGNOSIS — W06XXXA Fall from bed, initial encounter: Secondary | ICD-10-CM | POA: Diagnosis not present

## 2017-11-20 DIAGNOSIS — I4891 Unspecified atrial fibrillation: Secondary | ICD-10-CM | POA: Diagnosis not present

## 2017-11-20 DIAGNOSIS — E1122 Type 2 diabetes mellitus with diabetic chronic kidney disease: Secondary | ICD-10-CM | POA: Diagnosis not present

## 2017-11-20 DIAGNOSIS — F320 Major depressive disorder, single episode, mild: Secondary | ICD-10-CM | POA: Diagnosis not present

## 2017-11-20 DIAGNOSIS — I5032 Chronic diastolic (congestive) heart failure: Secondary | ICD-10-CM | POA: Diagnosis not present

## 2017-11-20 DIAGNOSIS — E118 Type 2 diabetes mellitus with unspecified complications: Secondary | ICD-10-CM | POA: Diagnosis not present

## 2017-11-20 DIAGNOSIS — Z951 Presence of aortocoronary bypass graft: Secondary | ICD-10-CM | POA: Diagnosis not present

## 2017-11-20 DIAGNOSIS — M6281 Muscle weakness (generalized): Secondary | ICD-10-CM | POA: Diagnosis present

## 2017-11-20 DIAGNOSIS — I10 Essential (primary) hypertension: Secondary | ICD-10-CM | POA: Diagnosis present

## 2017-11-20 DIAGNOSIS — R1311 Dysphagia, oral phase: Secondary | ICD-10-CM | POA: Diagnosis present

## 2017-11-20 DIAGNOSIS — I5031 Acute diastolic (congestive) heart failure: Secondary | ICD-10-CM | POA: Diagnosis present

## 2017-11-20 DIAGNOSIS — S299XXA Unspecified injury of thorax, initial encounter: Secondary | ICD-10-CM | POA: Diagnosis not present

## 2017-11-20 DIAGNOSIS — J449 Chronic obstructive pulmonary disease, unspecified: Secondary | ICD-10-CM | POA: Diagnosis present

## 2017-11-20 DIAGNOSIS — S199XXA Unspecified injury of neck, initial encounter: Secondary | ICD-10-CM | POA: Diagnosis not present

## 2017-11-20 DIAGNOSIS — I25119 Atherosclerotic heart disease of native coronary artery with unspecified angina pectoris: Secondary | ICD-10-CM | POA: Diagnosis not present

## 2017-11-20 DIAGNOSIS — I639 Cerebral infarction, unspecified: Secondary | ICD-10-CM | POA: Diagnosis not present

## 2017-11-20 DIAGNOSIS — K219 Gastro-esophageal reflux disease without esophagitis: Secondary | ICD-10-CM | POA: Diagnosis not present

## 2017-11-20 DIAGNOSIS — M19032 Primary osteoarthritis, left wrist: Secondary | ICD-10-CM | POA: Diagnosis not present

## 2017-11-20 DIAGNOSIS — I130 Hypertensive heart and chronic kidney disease with heart failure and stage 1 through stage 4 chronic kidney disease, or unspecified chronic kidney disease: Secondary | ICD-10-CM | POA: Diagnosis not present

## 2017-11-20 DIAGNOSIS — Y999 Unspecified external cause status: Secondary | ICD-10-CM | POA: Diagnosis not present

## 2017-11-20 DIAGNOSIS — J984 Other disorders of lung: Secondary | ICD-10-CM | POA: Diagnosis not present

## 2017-11-20 DIAGNOSIS — Y92122 Bedroom in nursing home as the place of occurrence of the external cause: Secondary | ICD-10-CM | POA: Diagnosis not present

## 2017-11-20 DIAGNOSIS — Z79899 Other long term (current) drug therapy: Secondary | ICD-10-CM | POA: Diagnosis not present

## 2017-11-20 DIAGNOSIS — E1140 Type 2 diabetes mellitus with diabetic neuropathy, unspecified: Secondary | ICD-10-CM | POA: Diagnosis not present

## 2017-11-24 ENCOUNTER — Non-Acute Institutional Stay (SKILLED_NURSING_FACILITY): Payer: Medicare Other | Admitting: Adult Health

## 2017-11-24 DIAGNOSIS — E7800 Pure hypercholesterolemia, unspecified: Secondary | ICD-10-CM

## 2017-11-24 DIAGNOSIS — E118 Type 2 diabetes mellitus with unspecified complications: Secondary | ICD-10-CM | POA: Diagnosis not present

## 2017-11-24 DIAGNOSIS — R634 Abnormal weight loss: Secondary | ICD-10-CM

## 2017-11-24 DIAGNOSIS — J449 Chronic obstructive pulmonary disease, unspecified: Secondary | ICD-10-CM

## 2017-11-24 DIAGNOSIS — Z794 Long term (current) use of insulin: Secondary | ICD-10-CM

## 2017-11-24 DIAGNOSIS — M25511 Pain in right shoulder: Secondary | ICD-10-CM | POA: Diagnosis not present

## 2017-11-24 DIAGNOSIS — IMO0001 Reserved for inherently not codable concepts without codable children: Secondary | ICD-10-CM

## 2017-11-24 LAB — HEPATIC FUNCTION PANEL
ALT: 8 — AB (ref 10–40)
AST: 12 — AB (ref 14–40)
Alkaline Phosphatase: 83 (ref 25–125)
Bilirubin, Total: 1.3

## 2017-11-24 LAB — CBC AND DIFFERENTIAL
HEMATOCRIT: 34 — AB (ref 41–53)
Hemoglobin: 12.2 — AB (ref 13.5–17.5)
Platelets: 137 — AB (ref 150–399)
WBC: 10

## 2017-11-24 LAB — BASIC METABOLIC PANEL
BUN: 24 — AB (ref 4–21)
CREATININE: 1.6 — AB (ref 0.6–1.3)
Glucose: 229
Potassium: 2.8 — AB (ref 3.4–5.3)
SODIUM: 128 — AB (ref 137–147)
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
## 2017-11-26 ENCOUNTER — Non-Acute Institutional Stay (SKILLED_NURSING_FACILITY): Payer: Medicare Other | Admitting: Adult Health

## 2017-11-26 ENCOUNTER — Encounter: Payer: Self-pay | Admitting: Adult Health

## 2017-11-26 DIAGNOSIS — J189 Pneumonia, unspecified organism: Secondary | ICD-10-CM

## 2017-11-26 DIAGNOSIS — Z794 Long term (current) use of insulin: Secondary | ICD-10-CM

## 2017-11-26 DIAGNOSIS — E876 Hypokalemia: Secondary | ICD-10-CM

## 2017-11-26 DIAGNOSIS — N183 Chronic kidney disease, stage 3 unspecified: Secondary | ICD-10-CM

## 2017-11-26 DIAGNOSIS — IMO0001 Reserved for inherently not codable concepts without codable children: Secondary | ICD-10-CM

## 2017-11-26 DIAGNOSIS — I5032 Chronic diastolic (congestive) heart failure: Secondary | ICD-10-CM | POA: Diagnosis not present

## 2017-11-26 DIAGNOSIS — E118 Type 2 diabetes mellitus with unspecified complications: Secondary | ICD-10-CM | POA: Diagnosis not present
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
## 2017-11-27 LAB — BASIC METABOLIC PANEL
BUN: 20 (ref 4–21)
Creatinine: 1.4 — AB (ref 0.6–1.3)
Glucose: 103
POTASSIUM: 4.5 (ref 3.4–5.3)
SODIUM: 133 — AB (ref 137–147)

## 2017-12-01 ENCOUNTER — Non-Acute Institutional Stay (SKILLED_NURSING_FACILITY): Payer: Medicare Other | Admitting: Adult Health

## 2017-12-01 DIAGNOSIS — F0151 Vascular dementia with behavioral disturbance: Secondary | ICD-10-CM | POA: Diagnosis not present

## 2017-12-01 DIAGNOSIS — F01518 Vascular dementia, unspecified severity, with other behavioral disturbance: Secondary | ICD-10-CM

## 2017-12-01 DIAGNOSIS — I639 Cerebral infarction, unspecified: Secondary | ICD-10-CM

## 2017-12-01 DIAGNOSIS — I5032 Chronic diastolic (congestive) heart failure: Secondary | ICD-10-CM
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
## 2017-12-03 ENCOUNTER — Non-Acute Institutional Stay (SKILLED_NURSING_FACILITY): Payer: Medicare Other | Admitting: Adult Health

## 2017-12-03 ENCOUNTER — Encounter: Payer: Self-pay | Admitting: Adult Health

## 2017-12-03 DIAGNOSIS — I639 Cerebral infarction, unspecified: Secondary | ICD-10-CM | POA: Diagnosis not present

## 2017-12-03 DIAGNOSIS — J449 Chronic obstructive pulmonary disease, unspecified: Secondary | ICD-10-CM

## 2017-12-03 DIAGNOSIS — I4891 Unspecified atrial fibrillation: Secondary | ICD-10-CM | POA: Diagnosis not present
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
## 2017-12-08 ENCOUNTER — Ambulatory Visit: Payer: Medicare Other | Admitting: Cardiology

## 2017-12-09 ENCOUNTER — Encounter: Payer: Self-pay | Admitting: *Deleted

## 2017-12-11 ENCOUNTER — Encounter: Payer: Self-pay | Admitting: Adult Health

## 2017-12-11 ENCOUNTER — Telehealth: Payer: Self-pay

## 2017-12-11 ENCOUNTER — Non-Acute Institutional Stay (SKILLED_NURSING_FACILITY): Payer: Medicare Other | Admitting: Adult Health

## 2017-12-11 ENCOUNTER — Encounter: Payer: Self-pay | Admitting: Physician Assistant

## 2017-12-11 DIAGNOSIS — F0151 Vascular dementia with behavioral disturbance: Secondary | ICD-10-CM

## 2017-12-11 DIAGNOSIS — J449 Chronic obstructive pulmonary disease, unspecified: Secondary | ICD-10-CM

## 2017-12-11 DIAGNOSIS — R634 Abnormal weight loss: Secondary | ICD-10-CM

## 2017-12-11 DIAGNOSIS — F01518 Vascular dementia, unspecified severity, with other behavioral disturbance: Secondary | ICD-10-CM

## 2017-12-11 DIAGNOSIS — I5032 Chronic diastolic (congestive) heart failure: Secondary | ICD-10-CM | POA: Diagnosis not present

## 2017-12-11 NOTE — Telephone Encounter (Signed)
Copied from [place] [id number]. Topic: Quick Communication - Office Called Patient >> Dec 11, 2017  9:51 AM Ramond Craver B wrote: Reason for CRM: left message asking pt to call office please reschedule 5/10 appointment with dr g >> Dec 11, 2017 10:32 AM Scherrie Gerlach wrote: Son called to advise pt would not be able to come anyway.  Pt is a rehab, and probably will not get out. appt cancelled
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
## 2017-12-11 NOTE — Telephone Encounter (Signed)
FYI to Dr. G. 

## 2017-12-23 ENCOUNTER — Ambulatory Visit: Payer: Medicare Other | Admitting: Physician Assistant

## 2017-12-28 ENCOUNTER — Non-Acute Institutional Stay: Payer: Medicare Other | Admitting: Adult Health

## 2017-12-28 ENCOUNTER — Encounter: Payer: Self-pay | Admitting: Adult Health

## 2017-12-28 DIAGNOSIS — M19032 Primary osteoarthritis, left wrist: Secondary | ICD-10-CM
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
## 2018-01-01 ENCOUNTER — Non-Acute Institutional Stay (SKILLED_NURSING_FACILITY): Payer: Medicare Other | Admitting: Adult Health

## 2018-01-01 ENCOUNTER — Encounter: Payer: Self-pay | Admitting: Adult Health

## 2018-01-01 DIAGNOSIS — I679 Cerebrovascular disease, unspecified: Secondary | ICD-10-CM

## 2018-01-01 DIAGNOSIS — I25119 Atherosclerotic heart disease of native coronary artery with unspecified angina pectoris: Secondary | ICD-10-CM

## 2018-01-01 DIAGNOSIS — K219 Gastro-esophageal reflux disease without esophagitis: Secondary | ICD-10-CM | POA: Diagnosis not present

## 2018-01-01 DIAGNOSIS — J449 Chronic obstructive pulmonary disease, unspecified: Secondary | ICD-10-CM | POA: Diagnosis not present
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
## 2018-01-02 ENCOUNTER — Emergency Department (HOSPITAL_COMMUNITY)
Admission: EM | Admit: 2018-01-02 | Discharge: 2018-01-03 | Disposition: A | Discharge status: Home / Self Care | Payer: Medicare Other | Attending: Emergency Medicine | Admitting: Emergency Medicine

## 2018-01-02 ENCOUNTER — Emergency Department (HOSPITAL_COMMUNITY): Payer: Medicare Other

## 2018-01-02 ENCOUNTER — Encounter (HOSPITAL_COMMUNITY): Payer: Self-pay

## 2018-01-02 DIAGNOSIS — J449 Chronic obstructive pulmonary disease, unspecified: Secondary | ICD-10-CM | POA: Insufficient documentation

## 2018-01-02 DIAGNOSIS — E1140 Type 2 diabetes mellitus with diabetic neuropathy, unspecified: Secondary | ICD-10-CM | POA: Insufficient documentation

## 2018-01-02 DIAGNOSIS — M25511 Pain in right shoulder: Secondary | ICD-10-CM | POA: Diagnosis not present

## 2018-01-02 DIAGNOSIS — S199XXA Unspecified injury of neck, initial encounter: Secondary | ICD-10-CM | POA: Diagnosis not present

## 2018-01-02 DIAGNOSIS — S0990XA Unspecified injury of head, initial encounter: Secondary | ICD-10-CM | POA: Insufficient documentation

## 2018-01-02 DIAGNOSIS — E876 Hypokalemia: Secondary | ICD-10-CM | POA: Insufficient documentation

## 2018-01-02 DIAGNOSIS — I2510 Atherosclerotic heart disease of native coronary artery without angina pectoris: Secondary | ICD-10-CM | POA: Insufficient documentation

## 2018-01-02 DIAGNOSIS — Z96642 Presence of left artificial hip joint: Secondary | ICD-10-CM | POA: Insufficient documentation

## 2018-01-02 DIAGNOSIS — I5032 Chronic diastolic (congestive) heart failure: Secondary | ICD-10-CM | POA: Insufficient documentation

## 2018-01-02 DIAGNOSIS — I4891 Unspecified atrial fibrillation: Secondary | ICD-10-CM | POA: Diagnosis not present

## 2018-01-02 DIAGNOSIS — Z794 Long term (current) use of insulin: Secondary | ICD-10-CM | POA: Insufficient documentation

## 2018-01-02 DIAGNOSIS — I130 Hypertensive heart and chronic kidney disease with heart failure and stage 1 through stage 4 chronic kidney disease, or unspecified chronic kidney disease: Secondary | ICD-10-CM | POA: Insufficient documentation

## 2018-01-02 DIAGNOSIS — Y92129 Unspecified place in nursing home as the place of occurrence of the external cause: Secondary | ICD-10-CM

## 2018-01-02 DIAGNOSIS — W06XXXA Fall from bed, initial encounter: Secondary | ICD-10-CM | POA: Insufficient documentation

## 2018-01-02 DIAGNOSIS — S299XXA Unspecified injury of thorax, initial encounter: Secondary | ICD-10-CM | POA: Diagnosis not present

## 2018-01-02 DIAGNOSIS — N183 Chronic kidney disease, stage 3 (moderate): Secondary | ICD-10-CM | POA: Diagnosis not present

## 2018-01-02 DIAGNOSIS — Y999 Unspecified external cause status: Secondary | ICD-10-CM | POA: Insufficient documentation

## 2018-01-02 DIAGNOSIS — Z79899 Other long term (current) drug therapy: Secondary | ICD-10-CM | POA: Insufficient documentation

## 2018-01-02 DIAGNOSIS — W19XXXA Unspecified fall, initial encounter: Secondary | ICD-10-CM

## 2018-01-02 DIAGNOSIS — E1122 Type 2 diabetes mellitus with diabetic chronic kidney disease: Secondary | ICD-10-CM | POA: Insufficient documentation

## 2018-01-02 DIAGNOSIS — Z87891 Personal history of nicotine dependence: Secondary | ICD-10-CM | POA: Insufficient documentation

## 2018-01-02 DIAGNOSIS — Z951 Presence of aortocoronary bypass graft: Secondary | ICD-10-CM | POA: Insufficient documentation

## 2018-01-02 DIAGNOSIS — Y939 Activity, unspecified: Secondary | ICD-10-CM | POA: Insufficient documentation

## 2018-01-02 DIAGNOSIS — Y92122 Bedroom in nursing home as the place of occurrence of the external cause: Secondary | ICD-10-CM | POA: Insufficient documentation

## 2018-01-02 DIAGNOSIS — Z7901 Long term (current) use of anticoagulants: Secondary | ICD-10-CM | POA: Insufficient documentation

## 2018-01-02 LAB — CBC WITH DIFFERENTIAL/PLATELET
BASOS PCT: 0 %
Basophils Absolute: 0 10*3/uL (ref 0.0–0.1)
Eosinophils Absolute: 0.1 10*3/uL (ref 0.0–0.7)
Eosinophils Relative: 1 %
HEMATOCRIT: 30.7 % — AB (ref 39.0–52.0)
Hemoglobin: 9.9 g/dL — ABNORMAL LOW (ref 13.0–17.0)
LYMPHS PCT: 7 %
Lymphs Abs: 0.6 10*3/uL — ABNORMAL LOW (ref 0.7–4.0)
MCH: 30 pg (ref 26.0–34.0)
MCHC: 32.2 g/dL (ref 30.0–36.0)
MCV: 93 fL (ref 78.0–100.0)
Monocytes Absolute: 0.5 10*3/uL (ref 0.1–1.0)
Monocytes Relative: 6 %
NEUTROS ABS: 6.7 10*3/uL (ref 1.7–7.7)
Neutrophils Relative %: 86 %
PLATELETS: 217 10*3/uL (ref 150–400)
RBC: 3.3 MIL/uL — AB (ref 4.22–5.81)
RDW: 15.6 % — AB (ref 11.5–15.5)
WBC: 7.8 10*3/uL (ref 4.0–10.5)

## 2018-01-02 LAB — BASIC METABOLIC PANEL
ANION GAP: 13 (ref 5–15)
BUN: 14 mg/dL (ref 6–20)
CALCIUM: 8.2 mg/dL — AB (ref 8.9–10.3)
CO2: 21 mmol/L — ABNORMAL LOW (ref 22–32)
Chloride: 102 mmol/L (ref 101–111)
Creatinine, Ser: 1.23 mg/dL (ref 0.61–1.24)
GFR, EST AFRICAN AMERICAN: 58 mL/min — AB (ref >60)
GFR, EST NON AFRICAN AMERICAN: 50 mL/min — AB (ref >60)
GLUCOSE: 119 mg/dL — AB (ref 65–99)
POTASSIUM: 2.9 mmol/L — AB (ref 3.5–5.1)
Sodium: 136 mmol/L (ref 135–145)

## 2018-01-02 NOTE — ED Triage Notes (Signed)
To room via EMS from [facility].  Pt was found on floor beside bed.  Pt reports slipped out of bed.  Staff put pt back in bed.  Skin tear to posterior right shoulder, staff applied tegaderm.  Pt has slight lump on back of head, no bleeding.  A&O to person, place, situation.
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
## 2018-01-02 NOTE — ED Notes (Signed)
Pt returned from xray

## 2018-01-03 DIAGNOSIS — M6281 Muscle weakness (generalized): Secondary | ICD-10-CM | POA: Diagnosis present

## 2018-01-03 DIAGNOSIS — R634 Abnormal weight loss: Secondary | ICD-10-CM | POA: Diagnosis not present

## 2018-01-03 DIAGNOSIS — E43 Unspecified severe protein-calorie malnutrition: Secondary | ICD-10-CM | POA: Diagnosis not present

## 2018-01-03 DIAGNOSIS — F0150 Vascular dementia without behavioral disturbance: Secondary | ICD-10-CM | POA: Diagnosis not present

## 2018-01-03 DIAGNOSIS — I4891 Unspecified atrial fibrillation: Secondary | ICD-10-CM | POA: Diagnosis not present

## 2018-01-03 DIAGNOSIS — F0390 Unspecified dementia without behavioral disturbance: Secondary | ICD-10-CM | POA: Diagnosis present

## 2018-01-03 DIAGNOSIS — H524 Presbyopia: Secondary | ICD-10-CM | POA: Diagnosis not present

## 2018-01-03 DIAGNOSIS — S0990XA Unspecified injury of head, initial encounter: Secondary | ICD-10-CM | POA: Diagnosis not present

## 2018-01-03 DIAGNOSIS — F0151 Vascular dementia with behavioral disturbance: Secondary | ICD-10-CM | POA: Diagnosis not present

## 2018-01-03 DIAGNOSIS — J449 Chronic obstructive pulmonary disease, unspecified: Secondary | ICD-10-CM | POA: Diagnosis not present

## 2018-01-03 DIAGNOSIS — I10 Essential (primary) hypertension: Secondary | ICD-10-CM | POA: Diagnosis present

## 2018-01-03 DIAGNOSIS — E785 Hyperlipidemia, unspecified: Secondary | ICD-10-CM | POA: Diagnosis present

## 2018-01-03 DIAGNOSIS — I130 Hypertensive heart and chronic kidney disease with heart failure and stage 1 through stage 4 chronic kidney disease, or unspecified chronic kidney disease: Secondary | ICD-10-CM | POA: Diagnosis not present

## 2018-01-03 DIAGNOSIS — M19042 Primary osteoarthritis, left hand: Secondary | ICD-10-CM | POA: Diagnosis present

## 2018-01-03 DIAGNOSIS — Z7984 Long term (current) use of oral hypoglycemic drugs: Secondary | ICD-10-CM | POA: Diagnosis not present

## 2018-01-03 DIAGNOSIS — E876 Hypokalemia: Secondary | ICD-10-CM | POA: Diagnosis not present

## 2018-01-03 DIAGNOSIS — E1159 Type 2 diabetes mellitus with other circulatory complications: Secondary | ICD-10-CM | POA: Diagnosis present

## 2018-01-03 DIAGNOSIS — E113293 Type 2 diabetes mellitus with mild nonproliferative diabetic retinopathy without macular edema, bilateral: Secondary | ICD-10-CM | POA: Diagnosis not present

## 2018-01-03 DIAGNOSIS — W19XXXA Unspecified fall, initial encounter: Secondary | ICD-10-CM | POA: Diagnosis not present

## 2018-01-03 DIAGNOSIS — Z794 Long term (current) use of insulin: Secondary | ICD-10-CM | POA: Diagnosis not present

## 2018-01-03 DIAGNOSIS — N183 Chronic kidney disease, stage 3 (moderate): Secondary | ICD-10-CM | POA: Diagnosis not present

## 2018-01-03 DIAGNOSIS — I5032 Chronic diastolic (congestive) heart failure: Secondary | ICD-10-CM | POA: Diagnosis present

## 2018-01-03 DIAGNOSIS — Y92129 Unspecified place in nursing home as the place of occurrence of the external cause: Secondary | ICD-10-CM | POA: Diagnosis not present

## 2018-01-03 DIAGNOSIS — K219 Gastro-esophageal reflux disease without esophagitis: Secondary | ICD-10-CM | POA: Diagnosis present

## 2018-01-03 DIAGNOSIS — R1311 Dysphagia, oral phase: Secondary | ICD-10-CM | POA: Diagnosis present

## 2018-01-03 DIAGNOSIS — F5105 Insomnia due to other mental disorder: Secondary | ICD-10-CM | POA: Diagnosis not present

## 2018-01-03 DIAGNOSIS — F320 Major depressive disorder, single episode, mild: Secondary | ICD-10-CM | POA: Diagnosis not present

## 2018-01-03 DIAGNOSIS — M19041 Primary osteoarthritis, right hand: Secondary | ICD-10-CM | POA: Diagnosis present

## 2018-01-03 DIAGNOSIS — I5031 Acute diastolic (congestive) heart failure: Secondary | ICD-10-CM | POA: Diagnosis present

## 2018-01-03 DIAGNOSIS — Z7901 Long term (current) use of anticoagulants: Secondary | ICD-10-CM | POA: Diagnosis not present

## 2018-01-03 DIAGNOSIS — J180 Bronchopneumonia, unspecified organism: Secondary | ICD-10-CM | POA: Diagnosis present

## 2018-01-03 LAB — I-STAT CHEM 8, ED
BUN: 14 mg/dL (ref 6–20)
CALCIUM ION: 1.05 mmol/L — AB (ref 1.15–1.40)
Chloride: 103 mmol/L (ref 101–111)
Creatinine, Ser: 1.1 mg/dL (ref 0.61–1.24)
Glucose, Bld: 107 mg/dL — ABNORMAL HIGH (ref 65–99)
HCT: 33 % — ABNORMAL LOW (ref 39.0–52.0)
HEMOGLOBIN: 11.2 g/dL — AB (ref 13.0–17.0)
Potassium: 3.6 mmol/L (ref 3.5–5.1)
Sodium: 139 mmol/L (ref 135–145)
TCO2: 22 mmol/L (ref 22–32)

## 2018-01-03 LAB — URINALYSIS, ROUTINE W REFLEX MICROSCOPIC
Bacteria, UA: NONE SEEN
Bilirubin Urine: NEGATIVE
Glucose, UA: NEGATIVE mg/dL
Ketones, ur: 5 mg/dL — AB
Leukocytes, UA: NEGATIVE
NITRITE: NEGATIVE
Protein, ur: 30 mg/dL — AB
SPECIFIC GRAVITY, URINE: 1.02 (ref 1.005–1.030)
Squamous Epithelial / LPF: NONE SEEN
pH: 5 (ref 5.0–8.0)

## 2018-01-03 MED ORDER — POTASSIUM CHLORIDE CRYS ER 20 MEQ PO TBCR
40.0000 meq | EXTENDED_RELEASE_TABLET | Freq: Once | ORAL | Status: AC
  Administered 2018-01-03: 40 meq via ORAL
  Filled 2018-01-03: qty 2

## 2018-01-03 MED ORDER — POTASSIUM CHLORIDE 10 MEQ/100ML IV SOLN
10.0000 meq | Freq: Once | INTRAVENOUS | Status: AC
  Administered 2018-01-03: 10 meq via INTRAVENOUS
  Filled 2018-01-03: qty 100

## 2018-01-03 MED ORDER — POTASSIUM CHLORIDE CRYS ER 20 MEQ PO TBCR: 20.0000 meq | EXTENDED_RELEASE_TABLET | Freq: Every day | ORAL | 0 refills | Status: DC

## 2018-01-03 NOTE — ED Notes (Signed)
[facility] called for transport.  

## 2018-01-03 NOTE — Discharge Instructions (Signed)
You have been evaluated for your fall.  Fortunately no significant injury were noted.  Your CT scans are normal.  Your potassium level is low today, please take supplementation as prescribed.  Follow up with your doctor for further care.

## 2018-01-03 NOTE — ED Notes (Signed)
Patient denies pain and is resting comfortably.  

## 2018-01-05 ENCOUNTER — Encounter: Payer: Self-pay | Admitting: Adult Health

## 2018-01-05 ENCOUNTER — Non-Acute Institutional Stay (SKILLED_NURSING_FACILITY): Payer: Medicare Other | Admitting: Adult Health

## 2018-01-05 DIAGNOSIS — I4891 Unspecified atrial fibrillation: Secondary | ICD-10-CM | POA: Diagnosis not present

## 2018-01-05 DIAGNOSIS — E876 Hypokalemia: Secondary | ICD-10-CM

## 2018-01-05 DIAGNOSIS — F01518 Vascular dementia, unspecified severity, with other behavioral disturbance: Secondary | ICD-10-CM

## 2018-01-05 DIAGNOSIS — F0151 Vascular dementia with behavioral disturbance: Secondary | ICD-10-CM | POA: Diagnosis not present

## 2018-01-05 DIAGNOSIS — Y92129 Unspecified place in nursing home as the place of occurrence of the external cause: Secondary | ICD-10-CM | POA: Diagnosis not present

## 2018-01-05 DIAGNOSIS — W19XXXA Unspecified fall, initial encounter: Secondary | ICD-10-CM | POA: Insufficient documentation
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
## 2018-01-06 DIAGNOSIS — E43 Unspecified severe protein-calorie malnutrition: Secondary | ICD-10-CM | POA: Insufficient documentation

## 2018-01-13 DIAGNOSIS — F0150 Vascular dementia without behavioral disturbance: Secondary | ICD-10-CM | POA: Diagnosis not present

## 2018-01-13 DIAGNOSIS — F320 Major depressive disorder, single episode, mild: Secondary | ICD-10-CM | POA: Diagnosis not present

## 2018-01-17 ENCOUNTER — Encounter: Payer: Self-pay | Admitting: Internal Medicine
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
## 2018-01-19 ENCOUNTER — Non-Acute Institutional Stay (SKILLED_NURSING_FACILITY): Payer: Medicare Other | Admitting: Adult Health

## 2018-01-19 ENCOUNTER — Encounter: Payer: Self-pay | Admitting: Adult Health

## 2018-01-19 DIAGNOSIS — E43 Unspecified severe protein-calorie malnutrition: Secondary | ICD-10-CM | POA: Diagnosis not present

## 2018-01-19 DIAGNOSIS — F01518 Vascular dementia, unspecified severity, with other behavioral disturbance: Secondary | ICD-10-CM

## 2018-01-19 DIAGNOSIS — F0151 Vascular dementia with behavioral disturbance: Secondary | ICD-10-CM | POA: Diagnosis not present

## 2018-01-19 DIAGNOSIS — R634 Abnormal weight loss: Secondary | ICD-10-CM | POA: Diagnosis not present
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
## 2018-01-20 LAB — VITAMIN B12: Vitamin B-12: 527

## 2018-01-20 LAB — HEPATIC FUNCTION PANEL
ALT: 5 — AB (ref 10–40)
AST: 8 — AB (ref 14–40)
Alkaline Phosphatase: 90 (ref 25–125)
Bilirubin, Total: 0.9

## 2018-01-20 LAB — IRON,TIBC AND FERRITIN PANEL
FERRITIN: 2000
Iron: 47
TIBC: 189
UIBC: 142

## 2018-01-20 LAB — BASIC METABOLIC PANEL
BUN: 19 (ref 4–21)
CREATININE: 1.2 (ref 0.6–1.3)
Glucose: 197
POTASSIUM: 3.7 (ref 3.4–5.3)
SODIUM: 140 (ref 137–147)

## 2018-01-25 DIAGNOSIS — Z7901 Long term (current) use of anticoagulants: Secondary | ICD-10-CM | POA: Diagnosis not present

## 2018-01-25 DIAGNOSIS — H524 Presbyopia: Secondary | ICD-10-CM | POA: Diagnosis not present

## 2018-01-25 DIAGNOSIS — Z794 Long term (current) use of insulin: Secondary | ICD-10-CM | POA: Diagnosis not present

## 2018-01-25 DIAGNOSIS — Z7984 Long term (current) use of oral hypoglycemic drugs: Secondary | ICD-10-CM | POA: Diagnosis not present

## 2018-01-25 DIAGNOSIS — E113293 Type 2 diabetes mellitus with mild nonproliferative diabetic retinopathy without macular edema, bilateral: Secondary | ICD-10-CM | POA: Diagnosis not present

## 2018-02-03 ENCOUNTER — Non-Acute Institutional Stay (SKILLED_NURSING_FACILITY): Payer: Medicare Other | Admitting: Adult Health

## 2018-02-03 ENCOUNTER — Encounter: Payer: Self-pay | Admitting: Adult Health

## 2018-02-03 DIAGNOSIS — F01518 Vascular dementia, unspecified severity, with other behavioral disturbance: Secondary | ICD-10-CM

## 2018-02-03 DIAGNOSIS — F0151 Vascular dementia with behavioral disturbance: Secondary | ICD-10-CM | POA: Diagnosis not present

## 2018-02-03 DIAGNOSIS — E43 Unspecified severe protein-calorie malnutrition: Secondary | ICD-10-CM | POA: Diagnosis not present

## 2018-02-03 DIAGNOSIS — R634 Abnormal weight loss: Secondary | ICD-10-CM
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
## 2018-02-04 DIAGNOSIS — F320 Major depressive disorder, single episode, mild: Secondary | ICD-10-CM | POA: Diagnosis not present

## 2018-02-04 DIAGNOSIS — F5105 Insomnia due to other mental disorder: Secondary | ICD-10-CM | POA: Diagnosis not present

## 2018-02-04 DIAGNOSIS — F0150 Vascular dementia without behavioral disturbance: Secondary | ICD-10-CM | POA: Diagnosis not present

## 2018-02-10 ENCOUNTER — Non-Acute Institutional Stay (SKILLED_NURSING_FACILITY): Payer: Medicare Other | Admitting: Adult Health

## 2018-02-10 ENCOUNTER — Encounter: Payer: Self-pay | Admitting: Adult Health

## 2018-02-10 DIAGNOSIS — D509 Iron deficiency anemia, unspecified: Secondary | ICD-10-CM | POA: Diagnosis not present

## 2018-02-10 DIAGNOSIS — E118 Type 2 diabetes mellitus with unspecified complications: Secondary | ICD-10-CM | POA: Diagnosis not present

## 2018-02-10 DIAGNOSIS — N183 Chronic kidney disease, stage 3 unspecified: Secondary | ICD-10-CM

## 2018-02-10 DIAGNOSIS — F0151 Vascular dementia with behavioral disturbance: Secondary | ICD-10-CM

## 2018-02-10 DIAGNOSIS — F01518 Vascular dementia, unspecified severity, with other behavioral disturbance: Secondary | ICD-10-CM

## 2018-02-10 DIAGNOSIS — Z794 Long term (current) use of insulin: Secondary | ICD-10-CM | POA: Diagnosis not present

## 2018-02-10 DIAGNOSIS — IMO0001 Reserved for inherently not codable concepts without codable children: Secondary | ICD-10-CM

## 2018-02-10 NOTE — Progress Notes (Addendum)
Location:   [facility] Room Number: 128 A Place of Service:  SNF (31)   CODE STATUS: DNR  Allergies  Allergen Reactions  . Cyanocobalamin [Vitamin B12] Other (See Comments)    Leg swelling to gummy B12 vitamin  . Ampicillin Rash    Skin rash    Chief Complaint  Patient presents with  . Care plan meeting.         HPI:   We have come together for his care plan meeting. He does have family present. He cannot fully participate in the hpi or ros; but did deny; any pain; no change in appetite; no chest pain. His care plan has been reviewed; he remains a DNR. There are no nursing concerns at this time.   Past Medical History:  Diagnosis Date  . Acute endocarditis   . Atrial fibrillation ([place])   . Bilateral hydrocele 3/11   [facility]  . Cerebrovascular disease, unspecified   . Cervicalgia   . Colon polyp   . COPD (chronic obstructive pulmonary disease) ([place]) 2017   dx during hospitalization  . Coronary atherosclerosis of unspecified type of vessel, native or graft    s/p CABG 2010  . Diabetes mellitus without complication ([place])   . Esophageal reflux   . Essential hypertension, benign   . Foraminal stenosis of cervical region    bilateral-C4-C5, C5-C6  . History of aortic stenosis    s/p valve replacment 2010  . Pure hypercholesterolemia   . Shingles 07/27/2012  . Stroke ([place])   . Type II or unspecified type diabetes mellitus with neurological manifestations, not stated as uncontrolled(250.60)   . 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 AORTIC VALVE REPLACEMENT  12/2008   with pericardial tissue valve  . CARDIAC SURGERY    . Carotid US  10/2009   B ICA stenosis, stable disease, rec rpt 2 years  . CATARACT EXTRACTION  1990's   bilateral  . CHOLECYSTECTOMY  1995  . COLONOSCOPY  [place]  . CORONARY ARTERY BYPASS  GRAFT  3/10   x3-using a left internal mammary artery to the left anterior descending coronary artery, saphenous vein graft to circumflex marginal branch, spahenous vein graft  to posterior descendingcoronary artery. Endoscopic saphenous vein harvest from bilateral thighs was done.   Marland Kitchen HYDROCELE EXCISION     bilateral ([facility])  . L leg trauma  1957   truck over left leg  . PTCA  12/99   with stent  . sphincterectomy  09/20/03   for jaundice  . TEE WITHOUT CARDIOVERSION N/A 01/09/2016   Procedure: TRANSESOPHAGEAL ECHOCARDIOGRAM (TEE);  Surgeon: Dorothy Spark, MD;  Location: [facility];  Service: Cardiovascular;  Laterality: N/A;  . TEE WITHOUT CARDIOVERSION N/A 01/15/2016   Procedure: TRANSESOPHAGEAL ECHOCARDIOGRAM (TEE);  Surgeon: Larey Dresser, MD;  Location: [facility];  Service: Cardiovascular;  Laterality: N/A;  . TOTAL HIP ARTHROPLASTY Left 06/30/2014   Procedure: LEFT TOTAL HIP ARTHROPLASTY ANTERIOR APPROACH;  Surgeon: Mcarthur Rossetti, MD;  Location: [facility];  Service: Orthopedics;  Laterality: Left;    Social History   Socioeconomic History  . Marital status: Widowed    Spouse name: Not on file  . Number of children: 3  . Years of education: Not on file  . Highest education level: Not on file  Occupational History  . Occupation: retired-truck Geophysicist/field seismologist  Social Needs  . Financial resource strain: Not on file  . Food insecurity:    Worry: Not on file    Inability: Not on file  . Transportation needs:    Medical: Not on file    Non-medical: Not on file  Tobacco Use  . Smoking status: Former Smoker    Packs/day: 1.00    Years: 50.00    Pack years: 50.00    Types: Cigarettes    Last attempt to quit: 10/20/1993    Years since quitting: 24.3  . Smokeless tobacco: Never Used  Substance and Sexual Activity  . Alcohol use: Yes    Alcohol/week: 0.6 oz    Types: 1 Cans of beer per week    Comment: occasional  . Drug use: No  . Sexual activity: Never   Lifestyle  . Physical activity:    Days per week: Not on file    Minutes per session: Not on file  . Stress: Not on file  Relationships  . Social connections:    Talks on phone: Not on file    Gets together: Not on file    Attends religious service: Not on file    Active member of club or organization: Not on file    Attends meetings of clubs or organizations: Not on file    Relationship status: Not on file  . Intimate partner violence:    Fear of current or ex partner: Not on file    Emotionally abused: Not on file    Physically abused: Not on file    Forced sexual activity: Not on file  Other Topics Concern  . Not on file  Social History Narrative  . Not on file   Family History  Problem Relation Age of Onset  . Heart attack Father 40  . Breast cancer Mother 28  . Brain cancer Sister   . Prostate cancer Brother   . Diabetes Neg Hx   . Stroke Neg Hx       VITAL SIGNS BP 120/68   Pulse 70   Temp 97.9 F (36.6 C)   Resp 20   Ht 5\' 9"  (1.753 m)   Wt 152 lb 4.8 oz (69.1 kg)   SpO2 98%   BMI 22.49 kg/m    Outpatient Encounter Medications as of 02/10/2018  Medication Sig  . acetaminophen (TYLENOL) 325 MG tablet Take 650 mg by mouth 3 (three) times daily.   Marland Kitchen amLODipine (NORVASC) 5 MG tablet Take 5 mg by mouth daily.   . diclofenac sodium (VOLTAREN) 1 % GEL Apply 2 g topically 4 (four) times daily as needed. Apply to left wrist  . diltiazem (CARDIZEM) 30 MG tablet Take 1 tablet (30 mg total) by mouth 2 (two) times daily.  Marland Kitchen dronabinol (MARINOL) 2.5 MG capsule Take 2.5 mg by mouth every morning.  Marland Kitchen ELIQUIS 2.5 MG TABS tablet TAKE 1 TABLET TWICE A DAY  . famotidine (PEPCID) 40 MG tablet Take 40 mg by mouth daily.  Marland Kitchen FLUoxetine (PROZAC) 40 MG capsule Take 40 mg by mouth daily.  . fluticasone (FLONASE) 50 MCG/ACT nasal spray Place 2 sprays into both nostrils daily.  Marland Kitchen GLUCERNA (GLUCERNA) LIQD Give 120 ml by mouth after meals for Supplement  . insulin glargine  (LANTUS) 100 UNIT/ML injection Inject 31 Units into the skin at bedtime.  Marland Kitchen ipratropium (ATROVENT) 0.02 % nebulizer solution Take 2.5 mLs (0.5 mg total) by nebulization every 2 (two) hours as needed for wheezing or shortness of breath (give  with Xopenex).  Marland Kitchen levalbuterol (XOPENEX) 0.63 MG/3ML nebulizer solution Take 3 mLs (0.63 mg total) by nebulization every 2 (two) hours as needed for wheezing or shortness of breath (give with atrovent).  . nitroGLYCERIN (NITROSTAT) 0.4 MG SL tablet Place 1 tablet (0.4 mg total) under the tongue every 5 (five) minutes as needed. Call MD/NP if pain unrelieved.  . Nutritional Supplements (NUTRITIONAL SUPPLEMENT PO) HSG CCD NAS Diet - HSG mech soft, Regular Consistency  . ondansetron (ZOFRAN) 4 MG tablet Take 4 mg by mouth every 6 (six) hours as needed for nausea or vomiting.  . potassium chloride SA (K-DUR,KLOR-CON) 20 MEQ tablet Take 1 tablet (20 mEq total) by mouth daily.  . sennosides-docusate sodium (SENOKOT-S) 8.6-50 MG tablet Take 1 tablet by mouth at bedtime.  . simethicone (MYLICON) 811 MG chewable tablet Give 1 tablet by mouth before meals and at bedtime  . tetrahydrozoline-zinc (VISINE-AC) 0.05-0.25 % ophthalmic solution Place 2 drops into both eyes as needed (dry eyes).   Marland Kitchen tiotropium (SPIRIVA) 18 MCG inhalation capsule Place 18 mcg into inhaler and inhale daily.  . traZODone (DESYREL) 50 MG tablet Take 50 mg by mouth at bedtime.  . mirtazapine (REMERON) 7.5 MG tablet Take one tablet by mouth every other night for 2 weeks then D/C  . [DISCONTINUED] famotidine (PEPCID) 20 MG tablet Take 1 tablet (20 mg total) by mouth daily. (Patient not taking: Reported on 02/10/2018)   No facility-administered encounter medications on file as of 02/10/2018.      SIGNIFICANT DIAGNOSTIC EXAMS  PREVIOUS:   10-25-17: chest x-ray: Postsurgical changes of CABG and AVR. Mild LEFT basilar atelectasis versus infiltrate.  10-25-17: ct of head: 1. No acute intracranial pathology.    10-25-17: ct of chest abdomen and pelvis:  1. Status post CABG with borderline cardiomegaly.  Status post AVR. 2. Trace bilateral pleural effusions with subpleural pneumonic consolidations in the posterior segment of right upper lobe, left lower lobe and along the periphery of the right middle lobe. Associated mild peribronchial thickening that may represent superimposed bronchitic change. 3. Colonic diverticulosis without acute diverticulitis. 4. Stable fusiform 3.3 cm infrarenal abdominal aortic aneurysm. 5. Thoracolumbar spondylosis. 6. Status post cholecystectomy. 7. Stable cyst off the posterior aspect of the left kidney measuring 14 mm. Punctate nonobstructing right renal calculus. 8. Enlarged prostate, stable in appearance.   10-26-17: 2-d echo: -  Left ventricle: The cavity size was normal. There was moderate  concentric hypertrophy. Systolic function was normal. The estimated ejection fraction was in the range of 55% to 60%. Wall motion was normal; there were no regional wall motion abnormalities. - Aortic valve: A bioprosthesis was present and functioning normally. - Mitral valve: Severely calcified annulus. There was mild regurgitation. - Pulmonary arteries: PA peak pressure: 38 mm Hg (S).  11-24-17: chest x-ray: left lower lobe airspace disease can be related to pneumonia or atelectasis. New since prior exam.   11-27-17: chest x-ray: findings suspicious for bronchopneumonia on the left. this is similar or mildly worse than before   12-07-17: FEES: excessive opening in the UES which was present 90% of swallows  01-02-18: chest x-ray: Low lung volumes. Improved left basilar aeration from prior exam. No new abnormality.  01-02-18: right shoulder x-ray: Chronic change about the right shoulder without acute fracture or Dislocation.  01-02-18: ct of head and cervical spine:  CT HEAD: 1. No evidence of acute intracranial abnormality. No evidence of calvarial fracture. 2. Generalized  cerebral volume loss and moderate chronic small vessel ischemic changes  in the cerebral white matter. 3. Stable right occipital lobe encephalomalacia. 4. Minimal left maxillary sinusitis, possibly acute. CT CERVICAL SPINE: 1. No fracture or acute malalignment in the cervical spine. 2. Advanced multilevel degenerative changes in the cervical spine as detailed. 3. Mild degenerative spondylolisthesis in the upper cervical spine is stable.  NO NEW EXAMS     LABS REVIEWED: PREVIOUS:    02-12-17: hgb a1c 7.9;  Urine micro-albumin 1.8 10-25-17: wbc 10.4; hgb 12.8; hct 39.5; mcv 95.9; plt 363; glucose 271; bun 22; creat 1.98; k+ 3.7; na++ 137; ca 9.1; liver normal albumin 3.3 10-26-17; tsh 1.046 10-27-17: wbc 6.8; hgb 10.5; hct 33.3; mcv 97.4; plt 307; glucose 167; bun 16; creat 1.48; k+ 3.6; na++139; ca 8.6  10-29-17: glucose 246; bun 25; creat 1.56; k+ 4.2; na++ 134; ca 8.6 11-24-17: wbc 10.0; hgb 12.2; hct 34.4; mcv 86.6 ;plt 137; glucose 229; bun 23.7; creat 1.55; k+ 2..8; na++128 ca 8.5    01-02-18: wbc 7.8; hgb 9.9; hct 30.7; mcv 93.0; plt 217; glucose 119; bun 14; creat 1.23; k+ 2.9; na++ 136; ca 8.2 01-03-18: hgb 11.2; hct 33.0; glucose 107; bun 14; creat 1.10; k+ 3.6; na++ 139 ionized ca 1.05;    NO NEW LABS.    Review of Systems  Unable to perform ROS: Dementia (confusion )    Physical Exam  Constitutional: He appears well-developed and well-nourished. No distress.  Neck: No thyromegaly present.  Cardiovascular: Normal rate, regular rhythm and intact distal pulses.  Murmur heard. 1/6  Pulmonary/Chest: Effort normal and breath sounds normal. No respiratory distress.  Abdominal: Soft. Bowel sounds are normal. He exhibits no distension. There is no tenderness.  Musculoskeletal: He exhibits no edema.  Is able to move all extremities   Lymphadenopathy:    He has no cervical adenopathy.  Neurological: He is alert.  Skin: Skin is warm and dry. He is not diaphoretic.    .  ASSESSMENT/  PLAN:  TODAY:    1. Insulin dependent diabetes mellitus with complications:   2. Vascular dementia with behavioral disturbance:  3. CKD stage III:  4. Iron deficiency anemia:    Will continue his current plan of care. Time spent with care plan meeting  30 minutes.    MD is aware of resident's narcotic use and is in agreement with current plan of care. We will attempt to wean resident as apropriate   Ok Edwards NP [facility]  Contact [phone] Monday through Friday 8am- 5pm  After hours call [phone]

## 2018-02-16 ENCOUNTER — Ambulatory Visit: Payer: Medicare Other

## 2018-02-16 ENCOUNTER — Non-Acute Institutional Stay (SKILLED_NURSING_FACILITY): Payer: Medicare Other | Admitting: Adult Health

## 2018-02-16 ENCOUNTER — Encounter: Payer: Self-pay | Admitting: Adult Health

## 2018-02-16 DIAGNOSIS — F322 Major depressive disorder, single episode, severe without psychotic features: Secondary | ICD-10-CM

## 2018-02-16 DIAGNOSIS — K5909 Other constipation: Secondary | ICD-10-CM

## 2018-02-16 DIAGNOSIS — I5032 Chronic diastolic (congestive) heart failure: Secondary | ICD-10-CM

## 2018-02-16 DIAGNOSIS — I4891 Unspecified atrial fibrillation: Secondary | ICD-10-CM | POA: Diagnosis not present
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
## 2018-02-17 ENCOUNTER — Other Ambulatory Visit: Payer: Self-pay

## 2018-02-17 LAB — HEMOGLOBIN A1C: Hemoglobin A1C: 6.5

## 2018-02-17 MED ORDER — DRONABINOL 2.5 MG PO CAPS: 2.5000 mg | ORAL_CAPSULE | ORAL | 0 refills | Status: AC

## 2018-02-17 NOTE — Telephone Encounter (Signed)
Rx faxed to [facility] (p) [phone], (f) [phone]  

## 2018-02-26 ENCOUNTER — Encounter: Payer: Medicare Other | Admitting: Family Medicine

## 2018-03-02 DIAGNOSIS — F0150 Vascular dementia without behavioral disturbance: Secondary | ICD-10-CM | POA: Diagnosis not present

## 2018-03-02 DIAGNOSIS — F320 Major depressive disorder, single episode, mild: Secondary | ICD-10-CM | POA: Diagnosis not present

## 2018-03-02 DIAGNOSIS — F5105 Insomnia due to other mental disorder: Secondary | ICD-10-CM | POA: Diagnosis not present

## 2018-03-10 DIAGNOSIS — R262 Difficulty in walking, not elsewhere classified: Secondary | ICD-10-CM | POA: Diagnosis not present

## 2018-03-10 DIAGNOSIS — E1051 Type 1 diabetes mellitus with diabetic peripheral angiopathy without gangrene: Secondary | ICD-10-CM | POA: Diagnosis not present

## 2018-03-10 DIAGNOSIS — B351 Tinea unguium: Secondary | ICD-10-CM | POA: Diagnosis not present

## 2018-03-12 ENCOUNTER — Non-Acute Institutional Stay (SKILLED_NURSING_FACILITY): Payer: Medicare Other | Admitting: Adult Health

## 2018-03-12 ENCOUNTER — Encounter: Payer: Self-pay | Admitting: Adult Health

## 2018-03-12 DIAGNOSIS — F0151 Vascular dementia with behavioral disturbance: Secondary | ICD-10-CM | POA: Diagnosis not present

## 2018-03-12 DIAGNOSIS — F01518 Vascular dementia, unspecified severity, with other behavioral disturbance: Secondary | ICD-10-CM
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
## 2018-03-17 ENCOUNTER — Non-Acute Institutional Stay (SKILLED_NURSING_FACILITY): Payer: Medicare Other | Admitting: Adult Health

## 2018-03-17 ENCOUNTER — Encounter: Payer: Self-pay | Admitting: Adult Health

## 2018-03-17 DIAGNOSIS — I25119 Atherosclerotic heart disease of native coronary artery with unspecified angina pectoris: Secondary | ICD-10-CM | POA: Diagnosis not present

## 2018-03-17 DIAGNOSIS — E785 Hyperlipidemia, unspecified: Secondary | ICD-10-CM

## 2018-03-17 DIAGNOSIS — J449 Chronic obstructive pulmonary disease, unspecified: Secondary | ICD-10-CM

## 2018-03-17 DIAGNOSIS — I639 Cerebral infarction, unspecified: Secondary | ICD-10-CM

## 2018-03-17 DIAGNOSIS — E1169 Type 2 diabetes mellitus with other specified complication: Secondary | ICD-10-CM

## 2018-03-17 NOTE — Progress Notes (Signed)
Location:   [facility] Room Number: 221 A Place of Service:  SNF (31)   CODE STATUS: DNR  Allergies  Allergen Reactions  . Cyanocobalamin [Vitamin B12] Other (See Comments)    Leg swelling to gummy B12 vitamin  . Ampicillin Rash    Skin rash    Chief Complaint  Patient presents with  . Medical Management of Chronic Issues    Cad; cva; copd; dyslipidemia     HPI:  He is a 84 year old long term resident of this facility being seen for the management of his chronic illnesses: cad; cva; copd; dyslipidemia. He is unable to fully participate in the ros. He denies any cough; no chest pain; no weakness. There are no nursing concerns at this time.   Past Medical History:  Diagnosis Date  . Acute endocarditis   . Atrial fibrillation ([place])   . Bilateral hydrocele 3/11   [facility]  . Cerebrovascular disease, unspecified   . Cervicalgia   . Colon polyp   . COPD (chronic obstructive pulmonary disease) ([place]) 2017   dx during hospitalization  . Coronary atherosclerosis of unspecified type of vessel, native or graft    s/p CABG 2010  . Diabetes mellitus without complication ([place])   . Esophageal reflux   . Essential hypertension, benign   . Foraminal stenosis of cervical region    bilateral-C4-C5, C5-C6  . History of aortic stenosis    s/p valve replacment 2010  . Pure hypercholesterolemia   . Shingles 07/27/2012  . Stroke ([place])   . Type II or unspecified type diabetes mellitus with neurological manifestations, not stated as uncontrolled(250.60)   . 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 AORTIC VALVE REPLACEMENT  12/2008   with pericardial tissue valve  . CARDIAC SURGERY    . Carotid US  10/2009   B ICA stenosis, stable disease, rec rpt 2 years  . CATARACT EXTRACTION  1990's   bilateral  . CHOLECYSTECTOMY  1995  . COLONOSCOPY  [place]  . CORONARY ARTERY BYPASS GRAFT  3/10   x3-using a left internal mammary artery to the left anterior descending coronary artery, saphenous vein graft to circumflex marginal branch, spahenous vein graft  to posterior descendingcoronary artery. Endoscopic saphenous vein harvest from bilateral thighs was done.   Marland Kitchen HYDROCELE EXCISION     bilateral ([facility])  . L leg trauma  1957   truck over left leg  . PTCA  12/99   with stent  . sphincterectomy  09/20/03   for jaundice  . TEE WITHOUT CARDIOVERSION N/A 01/09/2016   Procedure: TRANSESOPHAGEAL ECHOCARDIOGRAM (TEE);  Surgeon: Dorothy Spark, MD;  Location: [place];  Service: Cardiovascular;  Laterality: N/A;  . TEE WITHOUT CARDIOVERSION N/A 01/15/2016   Procedure: TRANSESOPHAGEAL ECHOCARDIOGRAM (TEE);  Surgeon: Larey Dresser, MD;  Location: [place];  Service: Cardiovascular;  Laterality: N/A;  . TOTAL HIP ARTHROPLASTY Left 06/30/2014   Procedure: LEFT TOTAL HIP ARTHROPLASTY ANTERIOR APPROACH;  Surgeon: Mcarthur Rossetti, MD;  Location: [facility];  Service: Orthopedics;  Laterality: Left;    Social History   Socioeconomic History  . Marital status: Widowed    Spouse name: Not on file  . Number of children: 3  . Years of education: Not on file  . Highest education level: Not on file  Occupational History  . Occupation:  retired-truck driver  Social Needs  . Financial resource strain: Not on file  . Food insecurity:    Worry: Not on file    Inability: Not on file  . Transportation needs:    Medical: Not on file    Non-medical: Not on file  Tobacco Use  . Smoking status: Former Smoker    Packs/day: 1.00    Years: 50.00    Pack years: 50.00    Types: Cigarettes    Last attempt to quit: 10/20/1993    Years since quitting: 24.4  . Smokeless tobacco: Never Used  Substance and Sexual Activity  . Alcohol use: Yes    Alcohol/week: 0.6 oz    Types: 1 Cans of beer per week    Comment: occasional    . Drug use: No  . Sexual activity: Never  Lifestyle  . Physical activity:    Days per week: Not on file    Minutes per session: Not on file  . Stress: Not on file  Relationships  . Social connections:    Talks on phone: Not on file    Gets together: Not on file    Attends religious service: Not on file    Active member of club or organization: Not on file    Attends meetings of clubs or organizations: Not on file    Relationship status: Not on file  . Intimate partner violence:    Fear of current or ex partner: Not on file    Emotionally abused: Not on file    Physically abused: Not on file    Forced sexual activity: Not on file  Other Topics Concern  . Not on file  Social History Narrative  . Not on file   Family History  Problem Relation Age of Onset  . Heart attack Father 9  . Breast cancer Mother 13  . Brain cancer Sister   . Prostate cancer Brother   . Diabetes Neg Hx   . Stroke Neg Hx       VITAL SIGNS BP 124/78   Pulse 79   Temp 97.7 F (36.5 C)   Resp 19   Ht 5\' 9"  (1.753 m)   Wt 149 lb (67.6 kg)   SpO2 96%   BMI 22.00 kg/m   Outpatient Encounter Medications as of 03/17/2018  Medication Sig  . acetaminophen (TYLENOL) 325 MG tablet Take 650 mg by mouth 3 (three) times daily.   Marland Kitchen amLODipine (NORVASC) 5 MG tablet Take 5 mg by mouth daily.   . diclofenac sodium (VOLTAREN) 1 % GEL Apply 2 g topically 4 (four) times daily as needed. Apply to left wrist  . diltiazem (CARDIZEM) 30 MG tablet Take 1 tablet (30 mg total) by mouth 2 (two) times daily.  Marland Kitchen ELIQUIS 2.5 MG TABS tablet TAKE 1 TABLET TWICE A DAY  . famotidine (PEPCID) 40 MG tablet Take 40 mg by mouth daily.  Marland Kitchen FLUoxetine (PROZAC) 40 MG capsule Take 40 mg by mouth daily.  . fluticasone (FLONASE) 50 MCG/ACT nasal spray Place 2 sprays into both nostrils daily.  Marland Kitchen GLUCERNA (GLUCERNA) LIQD Give 120 ml by mouth after meals for Supplement  . insulin glargine (LANTUS) 100 UNIT/ML injection Inject 20 Units  into the skin at bedtime.   Marland Kitchen ipratropium (ATROVENT) 0.02 % nebulizer solution Take 2.5 mLs (0.5 mg total) by nebulization every 2 (two) hours as needed for wheezing or shortness of breath (give with Xopenex).  Marland Kitchen levalbuterol (XOPENEX) 0.63 MG/3ML nebulizer solution Take 3  mLs (0.63 mg total) by nebulization every 2 (two) hours as needed for wheezing or shortness of breath (give with atrovent).  . nitroGLYCERIN (NITROSTAT) 0.4 MG SL tablet Place 1 tablet (0.4 mg total) under the tongue every 5 (five) minutes as needed. Call MD/NP if pain unrelieved.  . Nutritional Supplements (NUTRITIONAL SUPPLEMENT PO) Regular Diet - HSG mech soft texture, Regular / thin consistency  . ondansetron (ZOFRAN) 4 MG tablet Take 4 mg by mouth every 6 (six) hours as needed for nausea or vomiting.  . potassium chloride SA (K-DUR,KLOR-CON) 20 MEQ tablet Take 1 tablet (20 mEq total) by mouth daily.  . sennosides-docusate sodium (SENOKOT-S) 8.6-50 MG tablet Take 1 tablet by mouth at bedtime. Hold for loose or liquid stools  . simethicone (MYLICON) 595 MG chewable tablet Give 1 tablet by mouth before meals and at bedtime  . tetrahydrozoline-zinc (VISINE-AC) 0.05-0.25 % ophthalmic solution Place 2 drops into both eyes as needed (dry eyes).   Marland Kitchen tiotropium (SPIRIVA) 18 MCG inhalation capsule Place 18 mcg into inhaler and inhale daily.  . traZODone (DESYREL) 50 MG tablet Take 50 mg by mouth at bedtime.  . mirtazapine (REMERON) 7.5 MG tablet Take one tablet by mouth every other night for 2 weeks then D/C   No facility-administered encounter medications on file as of 03/17/2018.      SIGNIFICANT DIAGNOSTIC EXAMS   PREVIOUS:   10-25-17: chest x-ray: Postsurgical changes of CABG and AVR. Mild LEFT basilar atelectasis versus infiltrate.  10-25-17: ct of head: 1. No acute intracranial pathology.   10-25-17: ct of chest abdomen and pelvis:  1. Status post CABG with borderline cardiomegaly.  Status post AVR. 2. Trace bilateral  pleural effusions with subpleural pneumonic consolidations in the posterior segment of right upper lobe, left lower lobe and along the periphery of the right middle lobe. Associated mild peribronchial thickening that may represent superimposed bronchitic change. 3. Colonic diverticulosis without acute diverticulitis. 4. Stable fusiform 3.3 cm infrarenal abdominal aortic aneurysm. 5. Thoracolumbar spondylosis. 6. Status post cholecystectomy. 7. Stable cyst off the posterior aspect of the left kidney measuring 14 mm. Punctate nonobstructing right renal calculus. 8. Enlarged prostate, stable in appearance.   10-26-17: 2-d echo: -  Left ventricle: The cavity size was normal. There was moderate  concentric hypertrophy. Systolic function was normal. The estimated ejection fraction was in the range of 55% to 60%. Wall motion was normal; there were no regional wall motion abnormalities. - Aortic valve: A bioprosthesis was present and functioning normally. - Mitral valve: Severely calcified annulus. There was mild regurgitation. - Pulmonary arteries: PA peak pressure: 38 mm Hg (S).  11-24-17: chest x-ray: left lower lobe airspace disease can be related to pneumonia or atelectasis. New since prior exam.   11-27-17: chest x-ray: findings suspicious for bronchopneumonia on the left. this is similar or mildly worse than before   12-07-17: FEES: excessive opening in the UES which was present 90% of swallows  01-02-18: chest x-ray: Low lung volumes. Improved left basilar aeration from prior exam. No new abnormality.  01-02-18: right shoulder x-ray: Chronic change about the right shoulder without acute fracture or Dislocation.  01-02-18: ct of head and cervical spine:  CT HEAD: 1. No evidence of acute intracranial abnormality. No evidence of calvarial fracture. 2. Generalized cerebral volume loss and moderate chronic small vessel ischemic changes in the cerebral white matter. 3. Stable right occipital lobe  encephalomalacia. 4. Minimal left maxillary sinusitis, possibly acute. CT CERVICAL SPINE: 1. No fracture or acute malalignment  in the cervical spine. 2. Advanced multilevel degenerative changes in the cervical spine as detailed. 3. Mild degenerative spondylolisthesis in the upper cervical spine is stable.  NO NEW EXAMS     LABS REVIEWED: PREVIOUS:   10-25-17: wbc 10.4; hgb 12.8; hct 39.5; mcv 95.9; plt 363; glucose 271; bun 22; creat 1.98; k+ 3.7; na++ 137; ca 9.1; liver normal albumin 3.3 10-26-17; tsh 1.046 10-27-17: wbc 6.8; hgb 10.5; hct 33.3; mcv 97.4; plt 307; glucose 167; bun 16; creat 1.48; k+ 3.6; na++139; ca 8.6  10-29-17: glucose 246; bun 25; creat 1.56; k+ 4.2; na++ 134; ca 8.6 11-24-17: wbc 10.0; hgb 12.2; hct 34.4; mcv 86.6 ;plt 137; glucose 229; bun 23.7; creat 1.55; k+ 2..8; na++128 ca 8.5    01-02-18: wbc 7.8; hgb 9.9; hct 30.7; mcv 93.0; plt 217; glucose 119; bun 14; creat 1.23; k+ 2.9; na++ 136; ca 8.2 01-03-18: hgb 11.2; hct 33.0; glucose 107; bun 14; creat 1.10; k+ 3.6; na++ 139 ionized ca 1.05;    NO NEW LABS.   Review of Systems  Unable to perform ROS: Dementia (confusion )   Physical Exam  Constitutional: He appears well-developed and well-nourished. No distress.  Neck: No thyromegaly present.  Cardiovascular: Normal rate, regular rhythm and intact distal pulses.  Murmur heard. 1/6  Pulmonary/Chest: Effort normal and breath sounds normal. No respiratory distress.  Abdominal: Soft. Bowel sounds are normal. He exhibits no distension. There is no tenderness.  Musculoskeletal: Normal range of motion. He exhibits no edema.  Lymphadenopathy:    He has no cervical adenopathy.  Neurological: He is alert.  Skin: Skin is warm and dry. He is not diaphoretic.      ASSESSMENT/ PLAN:  TODAY:   1. Dyslipidemia associated with type 2 diabetes: is presently off medications.    2. CAD, native coronary artery: is status post CABG and status post acte endocarditis: is  stable will continue prn ntg  3. Stroke: is neurologically stable: is on long term eliquis 2.5 mg twice daily   4. COPD: is stable will continue as needed atrovent neb and xopenex neb every 2 hours as needed will continue spiriva   flonase daily for allergies   PREVIOUS  5.  GERD without esophagitis: stable will continue pepcid 40 mg daily  6. Essential hypertension: worse; b/p 124/78: will continue norvasc 5 mg daily and will monitor his status.    7. Insulin dependent diabetes mellitus with complications: hgb K2H 7.9; stable will continue lantus 20 units nightly     8. Vascular dementia with behavioral disturbance: he is continuing to lose weight over time; his aricept has been stopped due to his weight loss. His weight is presently stable.   9. CKD stage III: stable bun 23.7 creat 1.56  10. Iron deficiency anemia: hgb 10.5 will continue iron daily  11. Major depression disorder, single episode, severe, without psychotic features: stable will continue prozac 40 mg daily and trazodone 50 mg nightly   12. Chronic constipation: stable will continue senna s nightly    13. Atrial fibrillation with RVR: heart rate stable is taking Cardizem 30 mg twice daily for rate control will continue eliquis 2.5 mg twice daily  14. Chronic diastolic heart failure: stable EF 55-60% ( 10-26-17):is off lasix will not make changes will monitor his status.   Will get use for micro-albumin    MD is aware of resident's narcotic use and is in agreement with current plan of care. We will attempt to wean resident as apropriate  Ok Edwards NP [facility]  Contact [phone] Monday through Friday 8am- 5pm  After hours call [phone]

## 2018-03-18 ENCOUNTER — Non-Acute Institutional Stay (SKILLED_NURSING_FACILITY): Payer: Medicare Other

## 2018-03-18 DIAGNOSIS — Z0000 Encounter for general adult medical examination without abnormal findings: Secondary | ICD-10-CM | POA: Diagnosis not present
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
## 2018-03-18 NOTE — Patient Instructions (Signed)
[name] , Thank you for taking time to come for your Medicare Wellness Visit. I appreciate your ongoing commitment to your health goals. Please review the following plan we discussed and let me know if I can assist you in the future.   Screening recommendations/referrals: Colonoscopy excluded, over age 82 Recommended yearly ophthalmology/optometry visit for glaucoma screening and checkup Recommended yearly dental visit for hygiene and checkup  Vaccinations: Influenza vaccine due 2019 fall season Pneumococcal vaccine up to date, completed Tdap vaccine due, declined by facility Shingles vaccine not in past records    Advanced directives: in chart  Conditions/risks identified: none  Next appointment: Dr. Eulas Post makes rounds  Preventive Care 33 Years and Older, Male Preventive care refers to lifestyle choices and visits with your health care provider that can promote health and wellness. What does preventive care include?  A yearly physical exam. This is also called an annual well check.  Dental exams once or twice a year.  Routine eye exams. Ask your health care provider how often you should have your eyes checked.  Personal lifestyle choices, including:  Daily care of your teeth and gums.  Regular physical activity.  Eating a healthy diet.  Avoiding tobacco and drug use.  Limiting alcohol use.  Practicing safe sex.  Taking low doses of aspirin every day.  Taking vitamin and mineral supplements as recommended by your health care provider. What happens during an annual well check? The services and screenings done by your health care provider during your annual well check will depend on your age, overall health, lifestyle risk factors, and family history of disease. Counseling  Your health care provider may ask you questions about your:  Alcohol use.  Tobacco use.  Drug use.  Emotional well-being.  Home and relationship well-being.  Sexual  activity.  Eating habits.  History of falls.  Memory and ability to understand (cognition).  Work and work Statistician. Screening  You may have the following tests or measurements:  Height, weight, and BMI.  Blood pressure.  Lipid and cholesterol levels. These may be checked every 5 years, or more frequently if you are over 54 years old.  Skin check.  Lung cancer screening. You may have this screening every year starting at age 60 if you have a 30-pack-year history of smoking and currently smoke or have quit within the past 15 years.  Fecal occult blood test (FOBT) of the stool. You may have this test every year starting at age 68.  Flexible sigmoidoscopy or colonoscopy. You may have a sigmoidoscopy every 5 years or a colonoscopy every 10 years starting at age 37.  Prostate cancer screening. Recommendations will vary depending on your family history and other risks.  Hepatitis C blood test.  Hepatitis B blood test.  Sexually transmitted disease (STD) testing.  Diabetes screening. This is done by checking your blood sugar (glucose) after you have not eaten for a while (fasting). You may have this done every 1-3 years.  Abdominal aortic aneurysm (AAA) screening. You may need this if you are a current or former smoker.  Osteoporosis. You may be screened starting at age 13 if you are at high risk. Talk with your health care provider about your test results, treatment options, and if necessary, the need for more tests. Vaccines  Your health care provider may recommend certain vaccines, such as:  Influenza vaccine. This is recommended every year.  Tetanus, diphtheria, and acellular pertussis (Tdap, Td) vaccine. You may need a Td booster every 10  years.  Zoster vaccine. You may need this after age 92.  Pneumococcal 13-valent conjugate (PCV13) vaccine. One dose is recommended after age 61.  Pneumococcal polysaccharide (PPSV23) vaccine. One dose is recommended after age  72. Talk to your health care provider about which screenings and vaccines you need and how often you need them. This information is not intended to replace advice given to you by your health care provider. Make sure you discuss any questions you have with your health care provider. Document Released: 11/02/2015 Document Revised: 06/25/2016 Document Reviewed: 08/07/2015 Elsevier Interactive Patient Education  2017 [place] Prevention in the Home Falls can cause injuries. They can happen to people of all ages. There are many things you can do to make your home safe and to help prevent falls. What can I do on the outside of my home?  Regularly fix the edges of walkways and driveways and fix any cracks.  Remove anything that might make you trip as you walk through a door, such as a raised step or threshold.  Trim any bushes or trees on the path to your home.  Use bright outdoor lighting.  Clear any walking paths of anything that might make someone trip, such as rocks or tools.  Regularly check to see if handrails are loose or broken. Make sure that both sides of any steps have handrails.  Any raised decks and porches should have guardrails on the edges.  Have any leaves, snow, or ice cleared regularly.  Use sand or salt on walking paths during winter.  Clean up any spills in your garage right away. This includes oil or grease spills. What can I do in the bathroom?  Use night lights.  Install grab bars by the toilet and in the tub and shower. Do not use towel bars as grab bars.  Use non-skid mats or decals in the tub or shower.  If you need to sit down in the shower, use a plastic, non-slip stool.  Keep the floor dry. Clean up any water that spills on the floor as soon as it happens.  Remove soap buildup in the tub or shower regularly.  Attach bath mats securely with double-sided non-slip rug tape.  Do not have throw rugs and other things on the floor that can make  you trip. What can I do in the bedroom?  Use night lights.  Make sure that you have a light by your bed that is easy to reach.  Do not use any sheets or blankets that are too big for your bed. They should not hang down onto the floor.  Have a firm chair that has side arms. You can use this for support while you get dressed.  Do not have throw rugs and other things on the floor that can make you trip. What can I do in the kitchen?  Clean up any spills right away.  Avoid walking on wet floors.  Keep items that you use a lot in easy-to-reach places.  If you need to reach something above you, use a strong step stool that has a grab bar.  Keep electrical cords out of the way.  Do not use floor polish or wax that makes floors slippery. If you must use wax, use non-skid floor wax.  Do not have throw rugs and other things on the floor that can make you trip. What can I do with my stairs?  Do not leave any items on the stairs.  Make sure that  there are handrails on both sides of the stairs and use them. Fix handrails that are broken or loose. Make sure that handrails are as long as the stairways.  Check any carpeting to make sure that it is firmly attached to the stairs. Fix any carpet that is loose or worn.  Avoid having throw rugs at the top or bottom of the stairs. If you do have throw rugs, attach them to the floor with carpet tape.  Make sure that you have a light switch at the top of the stairs and the bottom of the stairs. If you do not have them, ask someone to add them for you. What else can I do to help prevent falls?  Wear shoes that:  Do not have high heels.  Have rubber bottoms.  Are comfortable and fit you well.  Are closed at the toe. Do not wear sandals.  If you use a stepladder:  Make sure that it is fully opened. Do not climb a closed stepladder.  Make sure that both sides of the stepladder are locked into place.  Ask someone to hold it for you, if  possible.  Clearly mark and make sure that you can see:  Any grab bars or handrails.  First and last steps.  Where the edge of each step is.  Use tools that help you move around (mobility aids) if they are needed. These include:  Canes.  Walkers.  Scooters.  Crutches.  Turn on the lights when you go into a dark area. Replace any light bulbs as soon as they burn out.  Set up your furniture so you have a clear path. Avoid moving your furniture around.  If any of your floors are uneven, fix them.  If there are any pets around you, be aware of where they are.  Review your medicines with your doctor. Some medicines can make you feel dizzy. This can increase your chance of falling. Ask your doctor what other things that you can do to help prevent falls. This information is not intended to replace advice given to you by your health care provider. Make sure you discuss any questions you have with your health care provider. Document Released: 08/02/2009 Document Revised: 03/13/2016 Document Reviewed: 11/10/2014 Elsevier Interactive Patient Education  2017 [employer].

## 2018-03-25 DIAGNOSIS — E119 Type 2 diabetes mellitus without complications: Secondary | ICD-10-CM | POA: Diagnosis not present

## 2018-03-25 LAB — MICROALBUMIN, URINE: MICROALB UR: 1.2

## 2018-04-01 DIAGNOSIS — F5105 Insomnia due to other mental disorder: Secondary | ICD-10-CM | POA: Diagnosis not present

## 2018-04-01 DIAGNOSIS — F320 Major depressive disorder, single episode, mild: Secondary | ICD-10-CM | POA: Diagnosis not present

## 2018-04-01 DIAGNOSIS — F0150 Vascular dementia without behavioral disturbance: Secondary | ICD-10-CM | POA: Diagnosis not present

## 2018-04-16 ENCOUNTER — Encounter: Payer: Self-pay | Admitting: Adult Health

## 2018-04-16 ENCOUNTER — Non-Acute Institutional Stay (SKILLED_NURSING_FACILITY): Payer: Medicare Other | Admitting: Adult Health

## 2018-04-16 DIAGNOSIS — F0151 Vascular dementia with behavioral disturbance: Secondary | ICD-10-CM

## 2018-04-16 DIAGNOSIS — Z794 Long term (current) use of insulin: Secondary | ICD-10-CM

## 2018-04-16 DIAGNOSIS — IMO0001 Reserved for inherently not codable concepts without codable children: Secondary | ICD-10-CM

## 2018-04-16 DIAGNOSIS — E118 Type 2 diabetes mellitus with unspecified complications: Secondary | ICD-10-CM | POA: Diagnosis not present

## 2018-04-16 DIAGNOSIS — I10 Essential (primary) hypertension: Secondary | ICD-10-CM

## 2018-04-16 DIAGNOSIS — K219 Gastro-esophageal reflux disease without esophagitis: Secondary | ICD-10-CM | POA: Diagnosis not present

## 2018-04-16 DIAGNOSIS — F01518 Vascular dementia, unspecified severity, with other behavioral disturbance: Secondary | ICD-10-CM
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
## 2018-04-29 DIAGNOSIS — I5031 Acute diastolic (congestive) heart failure: Secondary | ICD-10-CM | POA: Diagnosis not present

## 2018-04-29 DIAGNOSIS — J449 Chronic obstructive pulmonary disease, unspecified: Secondary | ICD-10-CM | POA: Diagnosis not present

## 2018-04-29 DIAGNOSIS — Z741 Need for assistance with personal care: Secondary | ICD-10-CM | POA: Diagnosis not present

## 2018-04-29 DIAGNOSIS — R279 Unspecified lack of coordination: Secondary | ICD-10-CM | POA: Diagnosis not present

## 2018-04-29 DIAGNOSIS — R2689 Other abnormalities of gait and mobility: Secondary | ICD-10-CM | POA: Diagnosis not present

## 2018-05-03 DIAGNOSIS — J449 Chronic obstructive pulmonary disease, unspecified: Secondary | ICD-10-CM | POA: Diagnosis not present

## 2018-05-03 DIAGNOSIS — R2689 Other abnormalities of gait and mobility: Secondary | ICD-10-CM | POA: Diagnosis not present

## 2018-05-03 DIAGNOSIS — Z741 Need for assistance with personal care: Secondary | ICD-10-CM | POA: Diagnosis not present

## 2018-05-03 DIAGNOSIS — I5031 Acute diastolic (congestive) heart failure: Secondary | ICD-10-CM | POA: Diagnosis not present

## 2018-05-03 DIAGNOSIS — R279 Unspecified lack of coordination: Secondary | ICD-10-CM | POA: Diagnosis not present

## 2018-05-04 DIAGNOSIS — I5031 Acute diastolic (congestive) heart failure: Secondary | ICD-10-CM | POA: Diagnosis not present

## 2018-05-04 DIAGNOSIS — R2689 Other abnormalities of gait and mobility: Secondary | ICD-10-CM | POA: Diagnosis not present

## 2018-05-04 DIAGNOSIS — R279 Unspecified lack of coordination: Secondary | ICD-10-CM | POA: Diagnosis not present

## 2018-05-04 DIAGNOSIS — Z741 Need for assistance with personal care: Secondary | ICD-10-CM | POA: Diagnosis not present

## 2018-05-04 DIAGNOSIS — J449 Chronic obstructive pulmonary disease, unspecified: Secondary | ICD-10-CM | POA: Diagnosis not present

## 2018-05-05 DIAGNOSIS — Z741 Need for assistance with personal care: Secondary | ICD-10-CM | POA: Diagnosis not present

## 2018-05-05 DIAGNOSIS — I5031 Acute diastolic (congestive) heart failure: Secondary | ICD-10-CM | POA: Diagnosis not present

## 2018-05-05 DIAGNOSIS — R279 Unspecified lack of coordination: Secondary | ICD-10-CM | POA: Diagnosis not present

## 2018-05-05 DIAGNOSIS — R2689 Other abnormalities of gait and mobility: Secondary | ICD-10-CM | POA: Diagnosis not present

## 2018-05-05 DIAGNOSIS — J449 Chronic obstructive pulmonary disease, unspecified: Secondary | ICD-10-CM | POA: Diagnosis not present

## 2018-05-06 DIAGNOSIS — F5105 Insomnia due to other mental disorder: Secondary | ICD-10-CM | POA: Diagnosis not present

## 2018-05-06 DIAGNOSIS — F0150 Vascular dementia without behavioral disturbance: Secondary | ICD-10-CM | POA: Diagnosis not present

## 2018-05-06 DIAGNOSIS — F320 Major depressive disorder, single episode, mild: Secondary | ICD-10-CM | POA: Diagnosis not present

## 2018-05-10 DIAGNOSIS — Z741 Need for assistance with personal care: Secondary | ICD-10-CM | POA: Diagnosis not present

## 2018-05-10 DIAGNOSIS — J449 Chronic obstructive pulmonary disease, unspecified: Secondary | ICD-10-CM | POA: Diagnosis not present

## 2018-05-10 DIAGNOSIS — R279 Unspecified lack of coordination: Secondary | ICD-10-CM | POA: Diagnosis not present

## 2018-05-10 DIAGNOSIS — I5031 Acute diastolic (congestive) heart failure: Secondary | ICD-10-CM | POA: Diagnosis not present

## 2018-05-10 DIAGNOSIS — R2689 Other abnormalities of gait and mobility: Secondary | ICD-10-CM | POA: Diagnosis not present

## 2018-05-11 DIAGNOSIS — I5031 Acute diastolic (congestive) heart failure: Secondary | ICD-10-CM | POA: Diagnosis not present

## 2018-05-11 DIAGNOSIS — J449 Chronic obstructive pulmonary disease, unspecified: Secondary | ICD-10-CM | POA: Diagnosis not present

## 2018-05-11 DIAGNOSIS — R279 Unspecified lack of coordination: Secondary | ICD-10-CM | POA: Diagnosis not present

## 2018-05-11 DIAGNOSIS — Z741 Need for assistance with personal care: Secondary | ICD-10-CM | POA: Diagnosis not present

## 2018-05-11 DIAGNOSIS — R2689 Other abnormalities of gait and mobility: Secondary | ICD-10-CM | POA: Diagnosis not present

## 2018-05-12 DIAGNOSIS — Z741 Need for assistance with personal care: Secondary | ICD-10-CM | POA: Diagnosis not present

## 2018-05-12 DIAGNOSIS — J449 Chronic obstructive pulmonary disease, unspecified: Secondary | ICD-10-CM | POA: Diagnosis not present

## 2018-05-12 DIAGNOSIS — R279 Unspecified lack of coordination: Secondary | ICD-10-CM | POA: Diagnosis not present

## 2018-05-12 DIAGNOSIS — I5031 Acute diastolic (congestive) heart failure: Secondary | ICD-10-CM | POA: Diagnosis not present

## 2018-05-12 DIAGNOSIS — R2689 Other abnormalities of gait and mobility: Secondary | ICD-10-CM | POA: Diagnosis not present

## 2018-05-13 DIAGNOSIS — J449 Chronic obstructive pulmonary disease, unspecified: Secondary | ICD-10-CM | POA: Diagnosis not present

## 2018-05-13 DIAGNOSIS — R2689 Other abnormalities of gait and mobility: Secondary | ICD-10-CM | POA: Diagnosis not present

## 2018-05-13 DIAGNOSIS — R279 Unspecified lack of coordination: Secondary | ICD-10-CM | POA: Diagnosis not present

## 2018-05-13 DIAGNOSIS — Z741 Need for assistance with personal care: Secondary | ICD-10-CM | POA: Diagnosis not present

## 2018-05-13 DIAGNOSIS — I5031 Acute diastolic (congestive) heart failure: Secondary | ICD-10-CM | POA: Diagnosis not present

## 2018-05-14 DIAGNOSIS — R2689 Other abnormalities of gait and mobility: Secondary | ICD-10-CM | POA: Diagnosis not present

## 2018-05-14 DIAGNOSIS — Z741 Need for assistance with personal care: Secondary | ICD-10-CM | POA: Diagnosis not present

## 2018-05-14 DIAGNOSIS — J449 Chronic obstructive pulmonary disease, unspecified: Secondary | ICD-10-CM | POA: Diagnosis not present

## 2018-05-14 DIAGNOSIS — R279 Unspecified lack of coordination: Secondary | ICD-10-CM | POA: Diagnosis not present

## 2018-05-14 DIAGNOSIS — I5031 Acute diastolic (congestive) heart failure: Secondary | ICD-10-CM | POA: Diagnosis not present

## 2018-05-17 ENCOUNTER — Non-Acute Institutional Stay (SKILLED_NURSING_FACILITY): Payer: Medicare Other | Admitting: Adult Health

## 2018-05-17 ENCOUNTER — Encounter: Payer: Self-pay | Admitting: Adult Health

## 2018-05-17 DIAGNOSIS — F322 Major depressive disorder, single episode, severe without psychotic features: Secondary | ICD-10-CM | POA: Diagnosis not present

## 2018-05-17 DIAGNOSIS — K5909 Other constipation: Secondary | ICD-10-CM | POA: Diagnosis not present

## 2018-05-17 DIAGNOSIS — Z741 Need for assistance with personal care: Secondary | ICD-10-CM | POA: Diagnosis not present

## 2018-05-17 DIAGNOSIS — R2689 Other abnormalities of gait and mobility: Secondary | ICD-10-CM | POA: Diagnosis not present

## 2018-05-17 DIAGNOSIS — R279 Unspecified lack of coordination: Secondary | ICD-10-CM | POA: Diagnosis not present

## 2018-05-17 DIAGNOSIS — N183 Chronic kidney disease, stage 3 unspecified: Secondary | ICD-10-CM

## 2018-05-17 DIAGNOSIS — D509 Iron deficiency anemia, unspecified: Secondary | ICD-10-CM | POA: Diagnosis not present

## 2018-05-17 DIAGNOSIS — I5031 Acute diastolic (congestive) heart failure: Secondary | ICD-10-CM | POA: Diagnosis not present

## 2018-05-17 DIAGNOSIS — J449 Chronic obstructive pulmonary disease, unspecified: Secondary | ICD-10-CM | POA: Diagnosis not present
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
## 2018-05-18 DIAGNOSIS — J449 Chronic obstructive pulmonary disease, unspecified: Secondary | ICD-10-CM | POA: Diagnosis not present

## 2018-05-18 DIAGNOSIS — Z741 Need for assistance with personal care: Secondary | ICD-10-CM | POA: Diagnosis not present

## 2018-05-18 DIAGNOSIS — R279 Unspecified lack of coordination: Secondary | ICD-10-CM | POA: Diagnosis not present

## 2018-05-18 DIAGNOSIS — R2689 Other abnormalities of gait and mobility: Secondary | ICD-10-CM | POA: Diagnosis not present

## 2018-05-18 DIAGNOSIS — I5031 Acute diastolic (congestive) heart failure: Secondary | ICD-10-CM | POA: Diagnosis not present

## 2018-05-19 DIAGNOSIS — R2689 Other abnormalities of gait and mobility: Secondary | ICD-10-CM | POA: Diagnosis not present

## 2018-05-19 DIAGNOSIS — R279 Unspecified lack of coordination: Secondary | ICD-10-CM | POA: Diagnosis not present

## 2018-05-19 DIAGNOSIS — I5031 Acute diastolic (congestive) heart failure: Secondary | ICD-10-CM | POA: Diagnosis not present

## 2018-05-19 DIAGNOSIS — Z741 Need for assistance with personal care: Secondary | ICD-10-CM | POA: Diagnosis not present

## 2018-05-19 DIAGNOSIS — J449 Chronic obstructive pulmonary disease, unspecified: Secondary | ICD-10-CM | POA: Diagnosis not present

## 2018-05-20 DIAGNOSIS — M6281 Muscle weakness (generalized): Secondary | ICD-10-CM | POA: Diagnosis not present

## 2018-05-20 DIAGNOSIS — R2689 Other abnormalities of gait and mobility: Secondary | ICD-10-CM | POA: Diagnosis not present

## 2018-05-20 DIAGNOSIS — J449 Chronic obstructive pulmonary disease, unspecified: Secondary | ICD-10-CM | POA: Diagnosis not present

## 2018-05-20 DIAGNOSIS — R279 Unspecified lack of coordination: Secondary | ICD-10-CM | POA: Diagnosis not present

## 2018-05-20 DIAGNOSIS — I5031 Acute diastolic (congestive) heart failure: Secondary | ICD-10-CM | POA: Diagnosis not present

## 2018-05-20 DIAGNOSIS — Z741 Need for assistance with personal care: Secondary | ICD-10-CM | POA: Diagnosis not present

## 2018-05-21 DIAGNOSIS — J449 Chronic obstructive pulmonary disease, unspecified: Secondary | ICD-10-CM | POA: Diagnosis not present

## 2018-05-21 DIAGNOSIS — M6281 Muscle weakness (generalized): Secondary | ICD-10-CM | POA: Diagnosis not present

## 2018-05-21 DIAGNOSIS — Z741 Need for assistance with personal care: Secondary | ICD-10-CM | POA: Diagnosis not present

## 2018-05-21 DIAGNOSIS — R279 Unspecified lack of coordination: Secondary | ICD-10-CM | POA: Diagnosis not present

## 2018-05-21 DIAGNOSIS — I5031 Acute diastolic (congestive) heart failure: Secondary | ICD-10-CM | POA: Diagnosis not present

## 2018-05-21 DIAGNOSIS — R2689 Other abnormalities of gait and mobility: Secondary | ICD-10-CM | POA: Diagnosis not present

## 2018-05-24 DIAGNOSIS — I5031 Acute diastolic (congestive) heart failure: Secondary | ICD-10-CM | POA: Diagnosis not present

## 2018-05-24 DIAGNOSIS — M6281 Muscle weakness (generalized): Secondary | ICD-10-CM | POA: Diagnosis not present

## 2018-05-24 DIAGNOSIS — Z741 Need for assistance with personal care: Secondary | ICD-10-CM | POA: Diagnosis not present

## 2018-05-24 DIAGNOSIS — J449 Chronic obstructive pulmonary disease, unspecified: Secondary | ICD-10-CM | POA: Diagnosis not present

## 2018-05-24 DIAGNOSIS — R279 Unspecified lack of coordination: Secondary | ICD-10-CM | POA: Diagnosis not present

## 2018-05-24 DIAGNOSIS — R2689 Other abnormalities of gait and mobility: Secondary | ICD-10-CM | POA: Diagnosis not present

## 2018-05-25 DIAGNOSIS — Z741 Need for assistance with personal care: Secondary | ICD-10-CM | POA: Diagnosis not present

## 2018-05-25 DIAGNOSIS — I5031 Acute diastolic (congestive) heart failure: Secondary | ICD-10-CM | POA: Diagnosis not present

## 2018-05-25 DIAGNOSIS — R279 Unspecified lack of coordination: Secondary | ICD-10-CM | POA: Diagnosis not present

## 2018-05-25 DIAGNOSIS — R2689 Other abnormalities of gait and mobility: Secondary | ICD-10-CM | POA: Diagnosis not present

## 2018-05-25 DIAGNOSIS — J449 Chronic obstructive pulmonary disease, unspecified: Secondary | ICD-10-CM | POA: Diagnosis not present

## 2018-05-25 DIAGNOSIS — M6281 Muscle weakness (generalized): Secondary | ICD-10-CM | POA: Diagnosis not present

## 2018-05-26 DIAGNOSIS — I5031 Acute diastolic (congestive) heart failure: Secondary | ICD-10-CM | POA: Diagnosis not present

## 2018-05-26 DIAGNOSIS — Z741 Need for assistance with personal care: Secondary | ICD-10-CM | POA: Diagnosis not present

## 2018-05-26 DIAGNOSIS — J449 Chronic obstructive pulmonary disease, unspecified: Secondary | ICD-10-CM | POA: Diagnosis not present

## 2018-05-26 DIAGNOSIS — R2689 Other abnormalities of gait and mobility: Secondary | ICD-10-CM | POA: Diagnosis not present

## 2018-05-26 DIAGNOSIS — M6281 Muscle weakness (generalized): Secondary | ICD-10-CM | POA: Diagnosis not present

## 2018-05-26 DIAGNOSIS — R279 Unspecified lack of coordination: Secondary | ICD-10-CM | POA: Diagnosis not present

## 2018-05-27 DIAGNOSIS — B351 Tinea unguium: Secondary | ICD-10-CM | POA: Diagnosis not present

## 2018-05-27 DIAGNOSIS — R2689 Other abnormalities of gait and mobility: Secondary | ICD-10-CM | POA: Diagnosis not present

## 2018-05-27 DIAGNOSIS — Z741 Need for assistance with personal care: Secondary | ICD-10-CM | POA: Diagnosis not present

## 2018-05-27 DIAGNOSIS — I5031 Acute diastolic (congestive) heart failure: Secondary | ICD-10-CM | POA: Diagnosis not present

## 2018-05-27 DIAGNOSIS — M6281 Muscle weakness (generalized): Secondary | ICD-10-CM | POA: Diagnosis not present

## 2018-05-27 DIAGNOSIS — R279 Unspecified lack of coordination: Secondary | ICD-10-CM | POA: Diagnosis not present

## 2018-05-27 DIAGNOSIS — J449 Chronic obstructive pulmonary disease, unspecified: Secondary | ICD-10-CM | POA: Diagnosis not present

## 2018-05-27 DIAGNOSIS — E1051 Type 1 diabetes mellitus with diabetic peripheral angiopathy without gangrene: Secondary | ICD-10-CM | POA: Diagnosis not present

## 2018-05-28 DIAGNOSIS — R2689 Other abnormalities of gait and mobility: Secondary | ICD-10-CM | POA: Diagnosis not present

## 2018-05-28 DIAGNOSIS — Z741 Need for assistance with personal care: Secondary | ICD-10-CM | POA: Diagnosis not present

## 2018-05-28 DIAGNOSIS — I5031 Acute diastolic (congestive) heart failure: Secondary | ICD-10-CM | POA: Diagnosis not present

## 2018-05-28 DIAGNOSIS — J449 Chronic obstructive pulmonary disease, unspecified: Secondary | ICD-10-CM | POA: Diagnosis not present

## 2018-05-28 DIAGNOSIS — M6281 Muscle weakness (generalized): Secondary | ICD-10-CM | POA: Diagnosis not present

## 2018-05-28 DIAGNOSIS — R279 Unspecified lack of coordination: Secondary | ICD-10-CM | POA: Diagnosis not present

## 2018-05-29 DIAGNOSIS — I5031 Acute diastolic (congestive) heart failure: Secondary | ICD-10-CM | POA: Diagnosis not present

## 2018-05-29 DIAGNOSIS — M6281 Muscle weakness (generalized): Secondary | ICD-10-CM | POA: Diagnosis not present

## 2018-05-29 DIAGNOSIS — J449 Chronic obstructive pulmonary disease, unspecified: Secondary | ICD-10-CM | POA: Diagnosis not present

## 2018-05-29 DIAGNOSIS — R279 Unspecified lack of coordination: Secondary | ICD-10-CM | POA: Diagnosis not present

## 2018-05-29 DIAGNOSIS — R2689 Other abnormalities of gait and mobility: Secondary | ICD-10-CM | POA: Diagnosis not present

## 2018-05-29 DIAGNOSIS — Z741 Need for assistance with personal care: Secondary | ICD-10-CM | POA: Diagnosis not present

## 2018-05-31 DIAGNOSIS — M6281 Muscle weakness (generalized): Secondary | ICD-10-CM | POA: Diagnosis not present

## 2018-05-31 DIAGNOSIS — R2689 Other abnormalities of gait and mobility: Secondary | ICD-10-CM | POA: Diagnosis not present

## 2018-05-31 DIAGNOSIS — R279 Unspecified lack of coordination: Secondary | ICD-10-CM | POA: Diagnosis not present

## 2018-05-31 DIAGNOSIS — I5031 Acute diastolic (congestive) heart failure: Secondary | ICD-10-CM | POA: Diagnosis not present

## 2018-05-31 DIAGNOSIS — Z741 Need for assistance with personal care: Secondary | ICD-10-CM | POA: Diagnosis not present

## 2018-05-31 DIAGNOSIS — J449 Chronic obstructive pulmonary disease, unspecified: Secondary | ICD-10-CM | POA: Diagnosis not present

## 2018-06-01 DIAGNOSIS — M6281 Muscle weakness (generalized): Secondary | ICD-10-CM | POA: Diagnosis not present

## 2018-06-01 DIAGNOSIS — I5031 Acute diastolic (congestive) heart failure: Secondary | ICD-10-CM | POA: Diagnosis not present

## 2018-06-01 DIAGNOSIS — J449 Chronic obstructive pulmonary disease, unspecified: Secondary | ICD-10-CM | POA: Diagnosis not present

## 2018-06-01 DIAGNOSIS — Z741 Need for assistance with personal care: Secondary | ICD-10-CM | POA: Diagnosis not present

## 2018-06-01 DIAGNOSIS — R2689 Other abnormalities of gait and mobility: Secondary | ICD-10-CM | POA: Diagnosis not present

## 2018-06-01 DIAGNOSIS — E1159 Type 2 diabetes mellitus with other circulatory complications: Secondary | ICD-10-CM | POA: Diagnosis not present

## 2018-06-01 DIAGNOSIS — I5032 Chronic diastolic (congestive) heart failure: Secondary | ICD-10-CM | POA: Diagnosis not present

## 2018-06-01 DIAGNOSIS — R279 Unspecified lack of coordination: Secondary | ICD-10-CM | POA: Diagnosis not present

## 2018-06-02 DIAGNOSIS — J449 Chronic obstructive pulmonary disease, unspecified: Secondary | ICD-10-CM | POA: Diagnosis not present

## 2018-06-02 DIAGNOSIS — M6281 Muscle weakness (generalized): Secondary | ICD-10-CM | POA: Diagnosis not present

## 2018-06-02 DIAGNOSIS — Z741 Need for assistance with personal care: Secondary | ICD-10-CM | POA: Diagnosis not present

## 2018-06-02 DIAGNOSIS — R279 Unspecified lack of coordination: Secondary | ICD-10-CM | POA: Diagnosis not present

## 2018-06-02 DIAGNOSIS — R2689 Other abnormalities of gait and mobility: Secondary | ICD-10-CM | POA: Diagnosis not present

## 2018-06-02 DIAGNOSIS — I5031 Acute diastolic (congestive) heart failure: Secondary | ICD-10-CM | POA: Diagnosis not present

## 2018-06-03 DIAGNOSIS — Z741 Need for assistance with personal care: Secondary | ICD-10-CM | POA: Diagnosis not present

## 2018-06-03 DIAGNOSIS — R279 Unspecified lack of coordination: Secondary | ICD-10-CM | POA: Diagnosis not present

## 2018-06-03 DIAGNOSIS — F0150 Vascular dementia without behavioral disturbance: Secondary | ICD-10-CM | POA: Diagnosis not present

## 2018-06-03 DIAGNOSIS — F5109 Other insomnia not due to a substance or known physiological condition: Secondary | ICD-10-CM | POA: Diagnosis not present

## 2018-06-03 DIAGNOSIS — R2689 Other abnormalities of gait and mobility: Secondary | ICD-10-CM | POA: Diagnosis not present

## 2018-06-03 DIAGNOSIS — I5031 Acute diastolic (congestive) heart failure: Secondary | ICD-10-CM | POA: Diagnosis not present

## 2018-06-03 DIAGNOSIS — F320 Major depressive disorder, single episode, mild: Secondary | ICD-10-CM | POA: Diagnosis not present

## 2018-06-03 DIAGNOSIS — M6281 Muscle weakness (generalized): Secondary | ICD-10-CM | POA: Diagnosis not present

## 2018-06-03 DIAGNOSIS — J449 Chronic obstructive pulmonary disease, unspecified: Secondary | ICD-10-CM | POA: Diagnosis not present

## 2018-06-04 DIAGNOSIS — R279 Unspecified lack of coordination: Secondary | ICD-10-CM | POA: Diagnosis not present

## 2018-06-04 DIAGNOSIS — Z741 Need for assistance with personal care: Secondary | ICD-10-CM | POA: Diagnosis not present

## 2018-06-04 DIAGNOSIS — M6281 Muscle weakness (generalized): Secondary | ICD-10-CM | POA: Diagnosis not present

## 2018-06-04 DIAGNOSIS — J449 Chronic obstructive pulmonary disease, unspecified: Secondary | ICD-10-CM | POA: Diagnosis not present

## 2018-06-04 DIAGNOSIS — I5031 Acute diastolic (congestive) heart failure: Secondary | ICD-10-CM | POA: Diagnosis not present

## 2018-06-04 DIAGNOSIS — R2689 Other abnormalities of gait and mobility: Secondary | ICD-10-CM | POA: Diagnosis not present

## 2018-06-06 DIAGNOSIS — R2689 Other abnormalities of gait and mobility: Secondary | ICD-10-CM | POA: Diagnosis not present

## 2018-06-06 DIAGNOSIS — J449 Chronic obstructive pulmonary disease, unspecified: Secondary | ICD-10-CM | POA: Diagnosis not present

## 2018-06-06 DIAGNOSIS — R279 Unspecified lack of coordination: Secondary | ICD-10-CM | POA: Diagnosis not present

## 2018-06-06 DIAGNOSIS — Z741 Need for assistance with personal care: Secondary | ICD-10-CM | POA: Diagnosis not present

## 2018-06-06 DIAGNOSIS — M6281 Muscle weakness (generalized): Secondary | ICD-10-CM | POA: Diagnosis not present

## 2018-06-06 DIAGNOSIS — I5031 Acute diastolic (congestive) heart failure: Secondary | ICD-10-CM | POA: Diagnosis not present

## 2018-06-07 DIAGNOSIS — J449 Chronic obstructive pulmonary disease, unspecified: Secondary | ICD-10-CM | POA: Diagnosis not present

## 2018-06-07 DIAGNOSIS — I5031 Acute diastolic (congestive) heart failure: Secondary | ICD-10-CM | POA: Diagnosis not present

## 2018-06-07 DIAGNOSIS — R279 Unspecified lack of coordination: Secondary | ICD-10-CM | POA: Diagnosis not present

## 2018-06-07 DIAGNOSIS — M6281 Muscle weakness (generalized): Secondary | ICD-10-CM | POA: Diagnosis not present

## 2018-06-07 DIAGNOSIS — Z741 Need for assistance with personal care: Secondary | ICD-10-CM | POA: Diagnosis not present

## 2018-06-07 DIAGNOSIS — R2689 Other abnormalities of gait and mobility: Secondary | ICD-10-CM | POA: Diagnosis not present

## 2018-06-08 DIAGNOSIS — R2689 Other abnormalities of gait and mobility: Secondary | ICD-10-CM | POA: Diagnosis not present

## 2018-06-08 DIAGNOSIS — I5032 Chronic diastolic (congestive) heart failure: Secondary | ICD-10-CM | POA: Diagnosis not present

## 2018-06-08 DIAGNOSIS — L89899 Pressure ulcer of other site, unspecified stage: Secondary | ICD-10-CM | POA: Diagnosis not present

## 2018-06-08 DIAGNOSIS — J449 Chronic obstructive pulmonary disease, unspecified: Secondary | ICD-10-CM | POA: Diagnosis not present

## 2018-06-08 DIAGNOSIS — I5031 Acute diastolic (congestive) heart failure: Secondary | ICD-10-CM | POA: Diagnosis not present

## 2018-06-08 DIAGNOSIS — R279 Unspecified lack of coordination: Secondary | ICD-10-CM | POA: Diagnosis not present

## 2018-06-08 DIAGNOSIS — Z741 Need for assistance with personal care: Secondary | ICD-10-CM | POA: Diagnosis not present

## 2018-06-08 DIAGNOSIS — E1159 Type 2 diabetes mellitus with other circulatory complications: Secondary | ICD-10-CM | POA: Diagnosis not present

## 2018-06-08 DIAGNOSIS — M6281 Muscle weakness (generalized): Secondary | ICD-10-CM | POA: Diagnosis not present

## 2018-06-09 ENCOUNTER — Encounter: Payer: Self-pay | Admitting: Internal Medicine

## 2018-06-09 DIAGNOSIS — I5031 Acute diastolic (congestive) heart failure: Secondary | ICD-10-CM | POA: Diagnosis not present

## 2018-06-09 DIAGNOSIS — Z741 Need for assistance with personal care: Secondary | ICD-10-CM | POA: Diagnosis not present

## 2018-06-09 DIAGNOSIS — M6281 Muscle weakness (generalized): Secondary | ICD-10-CM | POA: Diagnosis not present

## 2018-06-09 DIAGNOSIS — R2689 Other abnormalities of gait and mobility: Secondary | ICD-10-CM | POA: Diagnosis not present

## 2018-06-09 DIAGNOSIS — R279 Unspecified lack of coordination: Secondary | ICD-10-CM | POA: Diagnosis not present

## 2018-06-09 DIAGNOSIS — J449 Chronic obstructive pulmonary disease, unspecified: Secondary | ICD-10-CM | POA: Diagnosis not present

## 2018-06-10 ENCOUNTER — Encounter: Payer: Self-pay | Admitting: Internal Medicine

## 2018-06-10 ENCOUNTER — Non-Acute Institutional Stay (SKILLED_NURSING_FACILITY): Payer: Medicare Other | Admitting: Internal Medicine

## 2018-06-10 DIAGNOSIS — J449 Chronic obstructive pulmonary disease, unspecified: Secondary | ICD-10-CM | POA: Diagnosis not present

## 2018-06-10 DIAGNOSIS — H6191 Disorder of right external ear, unspecified: Secondary | ICD-10-CM

## 2018-06-10 DIAGNOSIS — L989 Disorder of the skin and subcutaneous tissue, unspecified: Secondary | ICD-10-CM

## 2018-06-10 DIAGNOSIS — R279 Unspecified lack of coordination: Secondary | ICD-10-CM | POA: Diagnosis not present

## 2018-06-10 DIAGNOSIS — R2689 Other abnormalities of gait and mobility: Secondary | ICD-10-CM | POA: Diagnosis not present

## 2018-06-10 DIAGNOSIS — Z741 Need for assistance with personal care: Secondary | ICD-10-CM | POA: Diagnosis not present

## 2018-06-10 DIAGNOSIS — M6281 Muscle weakness (generalized): Secondary | ICD-10-CM | POA: Diagnosis not present

## 2018-06-10 DIAGNOSIS — F01518 Vascular dementia, unspecified severity, with other behavioral disturbance: Secondary | ICD-10-CM

## 2018-06-10 DIAGNOSIS — F0151 Vascular dementia with behavioral disturbance: Secondary | ICD-10-CM | POA: Diagnosis not present

## 2018-06-10 DIAGNOSIS — I5031 Acute diastolic (congestive) heart failure: Secondary | ICD-10-CM | POA: Diagnosis not present
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
## 2018-06-11 DIAGNOSIS — Z741 Need for assistance with personal care: Secondary | ICD-10-CM | POA: Diagnosis not present

## 2018-06-11 DIAGNOSIS — R279 Unspecified lack of coordination: Secondary | ICD-10-CM | POA: Diagnosis not present

## 2018-06-11 DIAGNOSIS — M6281 Muscle weakness (generalized): Secondary | ICD-10-CM | POA: Diagnosis not present

## 2018-06-11 DIAGNOSIS — I5031 Acute diastolic (congestive) heart failure: Secondary | ICD-10-CM | POA: Diagnosis not present

## 2018-06-11 DIAGNOSIS — J449 Chronic obstructive pulmonary disease, unspecified: Secondary | ICD-10-CM | POA: Diagnosis not present

## 2018-06-11 DIAGNOSIS — R2689 Other abnormalities of gait and mobility: Secondary | ICD-10-CM | POA: Diagnosis not present

## 2018-06-12 DIAGNOSIS — J449 Chronic obstructive pulmonary disease, unspecified: Secondary | ICD-10-CM | POA: Diagnosis not present

## 2018-06-12 DIAGNOSIS — I5031 Acute diastolic (congestive) heart failure: Secondary | ICD-10-CM | POA: Diagnosis not present

## 2018-06-12 DIAGNOSIS — R2689 Other abnormalities of gait and mobility: Secondary | ICD-10-CM | POA: Diagnosis not present

## 2018-06-12 DIAGNOSIS — Z741 Need for assistance with personal care: Secondary | ICD-10-CM | POA: Diagnosis not present

## 2018-06-12 DIAGNOSIS — R279 Unspecified lack of coordination: Secondary | ICD-10-CM | POA: Diagnosis not present

## 2018-06-12 DIAGNOSIS — M6281 Muscle weakness (generalized): Secondary | ICD-10-CM | POA: Diagnosis not present

## 2018-06-13 DIAGNOSIS — Z741 Need for assistance with personal care: Secondary | ICD-10-CM | POA: Diagnosis not present

## 2018-06-13 DIAGNOSIS — R2689 Other abnormalities of gait and mobility: Secondary | ICD-10-CM | POA: Diagnosis not present

## 2018-06-13 DIAGNOSIS — R279 Unspecified lack of coordination: Secondary | ICD-10-CM | POA: Diagnosis not present

## 2018-06-13 DIAGNOSIS — M6281 Muscle weakness (generalized): Secondary | ICD-10-CM | POA: Diagnosis not present

## 2018-06-13 DIAGNOSIS — J449 Chronic obstructive pulmonary disease, unspecified: Secondary | ICD-10-CM | POA: Diagnosis not present

## 2018-06-13 DIAGNOSIS — I5031 Acute diastolic (congestive) heart failure: Secondary | ICD-10-CM | POA: Diagnosis not present

## 2018-06-14 DIAGNOSIS — Z741 Need for assistance with personal care: Secondary | ICD-10-CM | POA: Diagnosis not present

## 2018-06-14 DIAGNOSIS — M6281 Muscle weakness (generalized): Secondary | ICD-10-CM | POA: Diagnosis not present

## 2018-06-14 DIAGNOSIS — J449 Chronic obstructive pulmonary disease, unspecified: Secondary | ICD-10-CM | POA: Diagnosis not present

## 2018-06-14 DIAGNOSIS — R2689 Other abnormalities of gait and mobility: Secondary | ICD-10-CM | POA: Diagnosis not present

## 2018-06-14 DIAGNOSIS — I5031 Acute diastolic (congestive) heart failure: Secondary | ICD-10-CM | POA: Diagnosis not present

## 2018-06-14 DIAGNOSIS — R279 Unspecified lack of coordination: Secondary | ICD-10-CM | POA: Diagnosis not present

## 2018-06-15 DIAGNOSIS — J449 Chronic obstructive pulmonary disease, unspecified: Secondary | ICD-10-CM | POA: Diagnosis not present

## 2018-06-15 DIAGNOSIS — R2689 Other abnormalities of gait and mobility: Secondary | ICD-10-CM | POA: Diagnosis not present

## 2018-06-15 DIAGNOSIS — Z741 Need for assistance with personal care: Secondary | ICD-10-CM | POA: Diagnosis not present

## 2018-06-15 DIAGNOSIS — M6281 Muscle weakness (generalized): Secondary | ICD-10-CM | POA: Diagnosis not present

## 2018-06-15 DIAGNOSIS — R279 Unspecified lack of coordination: Secondary | ICD-10-CM | POA: Diagnosis not present

## 2018-06-15 DIAGNOSIS — I5032 Chronic diastolic (congestive) heart failure: Secondary | ICD-10-CM | POA: Diagnosis not present

## 2018-06-15 DIAGNOSIS — I5031 Acute diastolic (congestive) heart failure: Secondary | ICD-10-CM | POA: Diagnosis not present

## 2018-06-15 DIAGNOSIS — L89899 Pressure ulcer of other site, unspecified stage: Secondary | ICD-10-CM | POA: Diagnosis not present

## 2018-06-15 DIAGNOSIS — E1159 Type 2 diabetes mellitus with other circulatory complications: Secondary | ICD-10-CM | POA: Diagnosis not present

## 2018-06-16 DIAGNOSIS — J449 Chronic obstructive pulmonary disease, unspecified: Secondary | ICD-10-CM | POA: Diagnosis not present

## 2018-06-16 DIAGNOSIS — R279 Unspecified lack of coordination: Secondary | ICD-10-CM | POA: Diagnosis not present

## 2018-06-16 DIAGNOSIS — I5031 Acute diastolic (congestive) heart failure: Secondary | ICD-10-CM | POA: Diagnosis not present

## 2018-06-16 DIAGNOSIS — Z741 Need for assistance with personal care: Secondary | ICD-10-CM | POA: Diagnosis not present

## 2018-06-16 DIAGNOSIS — M6281 Muscle weakness (generalized): Secondary | ICD-10-CM | POA: Diagnosis not present

## 2018-06-16 DIAGNOSIS — R2689 Other abnormalities of gait and mobility: Secondary | ICD-10-CM | POA: Diagnosis not present

## 2018-06-17 DIAGNOSIS — M6281 Muscle weakness (generalized): Secondary | ICD-10-CM | POA: Diagnosis not present

## 2018-06-17 DIAGNOSIS — J449 Chronic obstructive pulmonary disease, unspecified: Secondary | ICD-10-CM | POA: Diagnosis not present

## 2018-06-17 DIAGNOSIS — F0150 Vascular dementia without behavioral disturbance: Secondary | ICD-10-CM | POA: Diagnosis not present

## 2018-06-17 DIAGNOSIS — I5031 Acute diastolic (congestive) heart failure: Secondary | ICD-10-CM | POA: Diagnosis not present

## 2018-06-17 DIAGNOSIS — F320 Major depressive disorder, single episode, mild: Secondary | ICD-10-CM | POA: Diagnosis not present

## 2018-06-17 DIAGNOSIS — Z741 Need for assistance with personal care: Secondary | ICD-10-CM | POA: Diagnosis not present

## 2018-06-17 DIAGNOSIS — R279 Unspecified lack of coordination: Secondary | ICD-10-CM | POA: Diagnosis not present

## 2018-06-17 DIAGNOSIS — F5109 Other insomnia not due to a substance or known physiological condition: Secondary | ICD-10-CM | POA: Diagnosis not present

## 2018-06-17 DIAGNOSIS — R2689 Other abnormalities of gait and mobility: Secondary | ICD-10-CM | POA: Diagnosis not present

## 2018-06-18 ENCOUNTER — Non-Acute Institutional Stay (SKILLED_NURSING_FACILITY): Payer: Medicare Other | Admitting: Adult Health

## 2018-06-18 ENCOUNTER — Encounter: Payer: Self-pay | Admitting: Adult Health

## 2018-06-18 DIAGNOSIS — E1169 Type 2 diabetes mellitus with other specified complication: Secondary | ICD-10-CM

## 2018-06-18 DIAGNOSIS — I5032 Chronic diastolic (congestive) heart failure: Secondary | ICD-10-CM

## 2018-06-18 DIAGNOSIS — E785 Hyperlipidemia, unspecified: Secondary | ICD-10-CM | POA: Diagnosis not present

## 2018-06-18 DIAGNOSIS — M6281 Muscle weakness (generalized): Secondary | ICD-10-CM | POA: Diagnosis not present

## 2018-06-18 DIAGNOSIS — R279 Unspecified lack of coordination: Secondary | ICD-10-CM | POA: Diagnosis not present

## 2018-06-18 DIAGNOSIS — E876 Hypokalemia: Secondary | ICD-10-CM | POA: Diagnosis not present

## 2018-06-18 DIAGNOSIS — I5031 Acute diastolic (congestive) heart failure: Secondary | ICD-10-CM | POA: Diagnosis not present

## 2018-06-18 DIAGNOSIS — J449 Chronic obstructive pulmonary disease, unspecified: Secondary | ICD-10-CM | POA: Diagnosis not present

## 2018-06-18 DIAGNOSIS — I4891 Unspecified atrial fibrillation: Secondary | ICD-10-CM

## 2018-06-18 DIAGNOSIS — R2689 Other abnormalities of gait and mobility: Secondary | ICD-10-CM | POA: Diagnosis not present

## 2018-06-18 DIAGNOSIS — Z741 Need for assistance with personal care: Secondary | ICD-10-CM | POA: Diagnosis not present

## 2018-06-21 DIAGNOSIS — R279 Unspecified lack of coordination: Secondary | ICD-10-CM | POA: Diagnosis not present

## 2018-06-21 DIAGNOSIS — J449 Chronic obstructive pulmonary disease, unspecified: Secondary | ICD-10-CM | POA: Diagnosis not present

## 2018-06-21 DIAGNOSIS — Z741 Need for assistance with personal care: Secondary | ICD-10-CM | POA: Diagnosis not present

## 2018-06-21 DIAGNOSIS — M6281 Muscle weakness (generalized): Secondary | ICD-10-CM | POA: Diagnosis not present

## 2018-06-21 DIAGNOSIS — R2689 Other abnormalities of gait and mobility: Secondary | ICD-10-CM | POA: Diagnosis not present

## 2018-06-21 DIAGNOSIS — I5031 Acute diastolic (congestive) heart failure: Secondary | ICD-10-CM | POA: Diagnosis not present

## 2018-06-22 DIAGNOSIS — M6281 Muscle weakness (generalized): Secondary | ICD-10-CM | POA: Diagnosis not present

## 2018-06-22 DIAGNOSIS — R279 Unspecified lack of coordination: Secondary | ICD-10-CM | POA: Diagnosis not present

## 2018-06-22 DIAGNOSIS — L89899 Pressure ulcer of other site, unspecified stage: Secondary | ICD-10-CM | POA: Diagnosis not present

## 2018-06-22 DIAGNOSIS — I5031 Acute diastolic (congestive) heart failure: Secondary | ICD-10-CM | POA: Diagnosis not present

## 2018-06-22 DIAGNOSIS — I5032 Chronic diastolic (congestive) heart failure: Secondary | ICD-10-CM | POA: Diagnosis not present

## 2018-06-22 DIAGNOSIS — J449 Chronic obstructive pulmonary disease, unspecified: Secondary | ICD-10-CM | POA: Diagnosis not present

## 2018-06-22 DIAGNOSIS — Z741 Need for assistance with personal care: Secondary | ICD-10-CM | POA: Diagnosis not present

## 2018-06-22 DIAGNOSIS — E1159 Type 2 diabetes mellitus with other circulatory complications: Secondary | ICD-10-CM | POA: Diagnosis not present

## 2018-06-22 DIAGNOSIS — R2689 Other abnormalities of gait and mobility: Secondary | ICD-10-CM | POA: Diagnosis not present

## 2018-06-23 DIAGNOSIS — Z741 Need for assistance with personal care: Secondary | ICD-10-CM | POA: Diagnosis not present

## 2018-06-23 DIAGNOSIS — I5031 Acute diastolic (congestive) heart failure: Secondary | ICD-10-CM | POA: Diagnosis not present

## 2018-06-23 DIAGNOSIS — R279 Unspecified lack of coordination: Secondary | ICD-10-CM | POA: Diagnosis not present

## 2018-06-23 DIAGNOSIS — M6281 Muscle weakness (generalized): Secondary | ICD-10-CM | POA: Diagnosis not present

## 2018-06-23 DIAGNOSIS — J449 Chronic obstructive pulmonary disease, unspecified: Secondary | ICD-10-CM | POA: Diagnosis not present

## 2018-06-23 DIAGNOSIS — R2689 Other abnormalities of gait and mobility: Secondary | ICD-10-CM | POA: Diagnosis not present

## 2018-06-24 DIAGNOSIS — J449 Chronic obstructive pulmonary disease, unspecified: Secondary | ICD-10-CM | POA: Diagnosis not present

## 2018-06-24 DIAGNOSIS — R279 Unspecified lack of coordination: Secondary | ICD-10-CM | POA: Diagnosis not present

## 2018-06-24 DIAGNOSIS — Z741 Need for assistance with personal care: Secondary | ICD-10-CM | POA: Diagnosis not present

## 2018-06-24 DIAGNOSIS — R2689 Other abnormalities of gait and mobility: Secondary | ICD-10-CM | POA: Diagnosis not present

## 2018-06-24 DIAGNOSIS — M6281 Muscle weakness (generalized): Secondary | ICD-10-CM | POA: Diagnosis not present

## 2018-06-24 DIAGNOSIS — I5031 Acute diastolic (congestive) heart failure: Secondary | ICD-10-CM | POA: Diagnosis not present

## 2018-06-25 DIAGNOSIS — Z741 Need for assistance with personal care: Secondary | ICD-10-CM | POA: Diagnosis not present

## 2018-06-25 DIAGNOSIS — R279 Unspecified lack of coordination: Secondary | ICD-10-CM | POA: Diagnosis not present

## 2018-06-25 DIAGNOSIS — I5031 Acute diastolic (congestive) heart failure: Secondary | ICD-10-CM | POA: Diagnosis not present

## 2018-06-25 DIAGNOSIS — M6281 Muscle weakness (generalized): Secondary | ICD-10-CM | POA: Diagnosis not present

## 2018-06-25 DIAGNOSIS — R2689 Other abnormalities of gait and mobility: Secondary | ICD-10-CM | POA: Diagnosis not present

## 2018-06-25 DIAGNOSIS — J449 Chronic obstructive pulmonary disease, unspecified: Secondary | ICD-10-CM | POA: Diagnosis not present

## 2018-06-28 DIAGNOSIS — R2689 Other abnormalities of gait and mobility: Secondary | ICD-10-CM | POA: Diagnosis not present

## 2018-06-28 DIAGNOSIS — I5031 Acute diastolic (congestive) heart failure: Secondary | ICD-10-CM | POA: Diagnosis not present

## 2018-06-28 DIAGNOSIS — J449 Chronic obstructive pulmonary disease, unspecified: Secondary | ICD-10-CM | POA: Diagnosis not present

## 2018-06-28 DIAGNOSIS — R279 Unspecified lack of coordination: Secondary | ICD-10-CM | POA: Diagnosis not present

## 2018-06-28 DIAGNOSIS — Z741 Need for assistance with personal care: Secondary | ICD-10-CM | POA: Diagnosis not present

## 2018-06-28 DIAGNOSIS — M6281 Muscle weakness (generalized): Secondary | ICD-10-CM | POA: Diagnosis not present

## 2018-06-29 DIAGNOSIS — I5031 Acute diastolic (congestive) heart failure: Secondary | ICD-10-CM | POA: Diagnosis not present

## 2018-06-29 DIAGNOSIS — R279 Unspecified lack of coordination: Secondary | ICD-10-CM | POA: Diagnosis not present

## 2018-06-29 DIAGNOSIS — R2689 Other abnormalities of gait and mobility: Secondary | ICD-10-CM | POA: Diagnosis not present

## 2018-06-29 DIAGNOSIS — M6281 Muscle weakness (generalized): Secondary | ICD-10-CM | POA: Diagnosis not present

## 2018-06-29 DIAGNOSIS — I5032 Chronic diastolic (congestive) heart failure: Secondary | ICD-10-CM | POA: Diagnosis not present

## 2018-06-29 DIAGNOSIS — E1159 Type 2 diabetes mellitus with other circulatory complications: Secondary | ICD-10-CM | POA: Diagnosis not present

## 2018-06-29 DIAGNOSIS — J449 Chronic obstructive pulmonary disease, unspecified: Secondary | ICD-10-CM | POA: Diagnosis not present

## 2018-06-29 DIAGNOSIS — L89899 Pressure ulcer of other site, unspecified stage: Secondary | ICD-10-CM | POA: Diagnosis not present

## 2018-06-29 DIAGNOSIS — Z741 Need for assistance with personal care: Secondary | ICD-10-CM | POA: Diagnosis not present
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
## 2018-06-30 ENCOUNTER — Non-Acute Institutional Stay (SKILLED_NURSING_FACILITY): Payer: Medicare Other | Admitting: Adult Health

## 2018-06-30 ENCOUNTER — Encounter: Payer: Self-pay | Admitting: Adult Health

## 2018-06-30 DIAGNOSIS — Z741 Need for assistance with personal care: Secondary | ICD-10-CM | POA: Diagnosis not present

## 2018-06-30 DIAGNOSIS — I5032 Chronic diastolic (congestive) heart failure: Secondary | ICD-10-CM

## 2018-06-30 DIAGNOSIS — J449 Chronic obstructive pulmonary disease, unspecified: Secondary | ICD-10-CM | POA: Diagnosis not present

## 2018-06-30 DIAGNOSIS — I25119 Atherosclerotic heart disease of native coronary artery with unspecified angina pectoris: Secondary | ICD-10-CM | POA: Diagnosis not present

## 2018-06-30 DIAGNOSIS — F01518 Vascular dementia, unspecified severity, with other behavioral disturbance: Secondary | ICD-10-CM

## 2018-06-30 DIAGNOSIS — M6281 Muscle weakness (generalized): Secondary | ICD-10-CM | POA: Diagnosis not present

## 2018-06-30 DIAGNOSIS — F0151 Vascular dementia with behavioral disturbance: Secondary | ICD-10-CM

## 2018-06-30 DIAGNOSIS — R279 Unspecified lack of coordination: Secondary | ICD-10-CM | POA: Diagnosis not present

## 2018-06-30 DIAGNOSIS — R2689 Other abnormalities of gait and mobility: Secondary | ICD-10-CM | POA: Diagnosis not present

## 2018-06-30 DIAGNOSIS — I5031 Acute diastolic (congestive) heart failure: Secondary | ICD-10-CM | POA: Diagnosis not present
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
## 2018-07-01 DIAGNOSIS — M6281 Muscle weakness (generalized): Secondary | ICD-10-CM | POA: Diagnosis not present

## 2018-07-01 DIAGNOSIS — F5109 Other insomnia not due to a substance or known physiological condition: Secondary | ICD-10-CM | POA: Diagnosis not present

## 2018-07-01 DIAGNOSIS — R2689 Other abnormalities of gait and mobility: Secondary | ICD-10-CM | POA: Diagnosis not present

## 2018-07-01 DIAGNOSIS — F0150 Vascular dementia without behavioral disturbance: Secondary | ICD-10-CM | POA: Diagnosis not present

## 2018-07-01 DIAGNOSIS — I5031 Acute diastolic (congestive) heart failure: Secondary | ICD-10-CM | POA: Diagnosis not present

## 2018-07-01 DIAGNOSIS — R279 Unspecified lack of coordination: Secondary | ICD-10-CM | POA: Diagnosis not present

## 2018-07-01 DIAGNOSIS — F320 Major depressive disorder, single episode, mild: Secondary | ICD-10-CM | POA: Diagnosis not present

## 2018-07-01 DIAGNOSIS — Z741 Need for assistance with personal care: Secondary | ICD-10-CM | POA: Diagnosis not present

## 2018-07-01 DIAGNOSIS — J449 Chronic obstructive pulmonary disease, unspecified: Secondary | ICD-10-CM | POA: Diagnosis not present

## 2018-07-02 DIAGNOSIS — R2689 Other abnormalities of gait and mobility: Secondary | ICD-10-CM | POA: Diagnosis not present

## 2018-07-02 DIAGNOSIS — M6281 Muscle weakness (generalized): Secondary | ICD-10-CM | POA: Diagnosis not present

## 2018-07-02 DIAGNOSIS — R279 Unspecified lack of coordination: Secondary | ICD-10-CM | POA: Diagnosis not present

## 2018-07-02 DIAGNOSIS — J449 Chronic obstructive pulmonary disease, unspecified: Secondary | ICD-10-CM | POA: Diagnosis not present

## 2018-07-02 DIAGNOSIS — I5031 Acute diastolic (congestive) heart failure: Secondary | ICD-10-CM | POA: Diagnosis not present

## 2018-07-02 DIAGNOSIS — Z741 Need for assistance with personal care: Secondary | ICD-10-CM | POA: Diagnosis not present

## 2018-07-04 DIAGNOSIS — M6281 Muscle weakness (generalized): Secondary | ICD-10-CM | POA: Diagnosis not present

## 2018-07-04 DIAGNOSIS — R2689 Other abnormalities of gait and mobility: Secondary | ICD-10-CM | POA: Diagnosis not present

## 2018-07-04 DIAGNOSIS — I5031 Acute diastolic (congestive) heart failure: Secondary | ICD-10-CM | POA: Diagnosis not present

## 2018-07-04 DIAGNOSIS — J449 Chronic obstructive pulmonary disease, unspecified: Secondary | ICD-10-CM | POA: Diagnosis not present

## 2018-07-04 DIAGNOSIS — Z741 Need for assistance with personal care: Secondary | ICD-10-CM | POA: Diagnosis not present

## 2018-07-04 DIAGNOSIS — R279 Unspecified lack of coordination: Secondary | ICD-10-CM | POA: Diagnosis not present

## 2018-07-05 DIAGNOSIS — J449 Chronic obstructive pulmonary disease, unspecified: Secondary | ICD-10-CM | POA: Diagnosis not present

## 2018-07-05 DIAGNOSIS — I5031 Acute diastolic (congestive) heart failure: Secondary | ICD-10-CM | POA: Diagnosis not present

## 2018-07-05 DIAGNOSIS — R2689 Other abnormalities of gait and mobility: Secondary | ICD-10-CM | POA: Diagnosis not present

## 2018-07-05 DIAGNOSIS — M6281 Muscle weakness (generalized): Secondary | ICD-10-CM | POA: Diagnosis not present

## 2018-07-05 DIAGNOSIS — Z741 Need for assistance with personal care: Secondary | ICD-10-CM | POA: Diagnosis not present

## 2018-07-05 DIAGNOSIS — R279 Unspecified lack of coordination: Secondary | ICD-10-CM | POA: Diagnosis not present

## 2018-07-06 DIAGNOSIS — R2689 Other abnormalities of gait and mobility: Secondary | ICD-10-CM | POA: Diagnosis not present

## 2018-07-06 DIAGNOSIS — I5032 Chronic diastolic (congestive) heart failure: Secondary | ICD-10-CM | POA: Diagnosis not present

## 2018-07-06 DIAGNOSIS — E1159 Type 2 diabetes mellitus with other circulatory complications: Secondary | ICD-10-CM | POA: Diagnosis not present

## 2018-07-06 DIAGNOSIS — L89899 Pressure ulcer of other site, unspecified stage: Secondary | ICD-10-CM | POA: Diagnosis not present

## 2018-07-06 DIAGNOSIS — I5031 Acute diastolic (congestive) heart failure: Secondary | ICD-10-CM | POA: Diagnosis not present

## 2018-07-06 DIAGNOSIS — Z741 Need for assistance with personal care: Secondary | ICD-10-CM | POA: Diagnosis not present

## 2018-07-06 DIAGNOSIS — M6281 Muscle weakness (generalized): Secondary | ICD-10-CM | POA: Diagnosis not present

## 2018-07-06 DIAGNOSIS — R279 Unspecified lack of coordination: Secondary | ICD-10-CM | POA: Diagnosis not present

## 2018-07-06 DIAGNOSIS — J449 Chronic obstructive pulmonary disease, unspecified: Secondary | ICD-10-CM | POA: Diagnosis not present

## 2018-07-07 DIAGNOSIS — R2689 Other abnormalities of gait and mobility: Secondary | ICD-10-CM | POA: Diagnosis not present

## 2018-07-07 DIAGNOSIS — I5031 Acute diastolic (congestive) heart failure: Secondary | ICD-10-CM | POA: Diagnosis not present

## 2018-07-07 DIAGNOSIS — M6281 Muscle weakness (generalized): Secondary | ICD-10-CM | POA: Diagnosis not present

## 2018-07-07 DIAGNOSIS — Z741 Need for assistance with personal care: Secondary | ICD-10-CM | POA: Diagnosis not present

## 2018-07-07 DIAGNOSIS — R279 Unspecified lack of coordination: Secondary | ICD-10-CM | POA: Diagnosis not present

## 2018-07-07 DIAGNOSIS — J449 Chronic obstructive pulmonary disease, unspecified: Secondary | ICD-10-CM | POA: Diagnosis not present

## 2018-07-08 DIAGNOSIS — M6281 Muscle weakness (generalized): Secondary | ICD-10-CM | POA: Diagnosis not present

## 2018-07-08 DIAGNOSIS — Z741 Need for assistance with personal care: Secondary | ICD-10-CM | POA: Diagnosis not present

## 2018-07-08 DIAGNOSIS — R2689 Other abnormalities of gait and mobility: Secondary | ICD-10-CM | POA: Diagnosis not present

## 2018-07-08 DIAGNOSIS — R279 Unspecified lack of coordination: Secondary | ICD-10-CM | POA: Diagnosis not present

## 2018-07-08 DIAGNOSIS — J449 Chronic obstructive pulmonary disease, unspecified: Secondary | ICD-10-CM | POA: Diagnosis not present

## 2018-07-08 DIAGNOSIS — I5031 Acute diastolic (congestive) heart failure: Secondary | ICD-10-CM | POA: Diagnosis not present

## 2018-07-09 DIAGNOSIS — J449 Chronic obstructive pulmonary disease, unspecified: Secondary | ICD-10-CM | POA: Diagnosis not present

## 2018-07-09 DIAGNOSIS — R279 Unspecified lack of coordination: Secondary | ICD-10-CM | POA: Diagnosis not present

## 2018-07-09 DIAGNOSIS — Z741 Need for assistance with personal care: Secondary | ICD-10-CM | POA: Diagnosis not present

## 2018-07-09 DIAGNOSIS — R2689 Other abnormalities of gait and mobility: Secondary | ICD-10-CM | POA: Diagnosis not present

## 2018-07-09 DIAGNOSIS — I5031 Acute diastolic (congestive) heart failure: Secondary | ICD-10-CM | POA: Diagnosis not present

## 2018-07-09 DIAGNOSIS — M6281 Muscle weakness (generalized): Secondary | ICD-10-CM | POA: Diagnosis not present

## 2018-07-12 DIAGNOSIS — E1159 Type 2 diabetes mellitus with other circulatory complications: Secondary | ICD-10-CM | POA: Diagnosis not present

## 2018-07-12 DIAGNOSIS — L89899 Pressure ulcer of other site, unspecified stage: Secondary | ICD-10-CM | POA: Diagnosis not present

## 2018-07-12 DIAGNOSIS — I5032 Chronic diastolic (congestive) heart failure: Secondary | ICD-10-CM | POA: Diagnosis not present

## 2018-07-12 DIAGNOSIS — R2689 Other abnormalities of gait and mobility: Secondary | ICD-10-CM | POA: Diagnosis not present

## 2018-07-12 DIAGNOSIS — Z741 Need for assistance with personal care: Secondary | ICD-10-CM | POA: Diagnosis not present

## 2018-07-12 DIAGNOSIS — R279 Unspecified lack of coordination: Secondary | ICD-10-CM | POA: Diagnosis not present

## 2018-07-12 DIAGNOSIS — I5031 Acute diastolic (congestive) heart failure: Secondary | ICD-10-CM | POA: Diagnosis not present

## 2018-07-12 DIAGNOSIS — M6281 Muscle weakness (generalized): Secondary | ICD-10-CM | POA: Diagnosis not present

## 2018-07-12 DIAGNOSIS — J449 Chronic obstructive pulmonary disease, unspecified: Secondary | ICD-10-CM | POA: Diagnosis not present

## 2018-07-13 DIAGNOSIS — R279 Unspecified lack of coordination: Secondary | ICD-10-CM | POA: Diagnosis not present

## 2018-07-13 DIAGNOSIS — J449 Chronic obstructive pulmonary disease, unspecified: Secondary | ICD-10-CM | POA: Diagnosis not present

## 2018-07-13 DIAGNOSIS — M6281 Muscle weakness (generalized): Secondary | ICD-10-CM | POA: Diagnosis not present

## 2018-07-13 DIAGNOSIS — Z741 Need for assistance with personal care: Secondary | ICD-10-CM | POA: Diagnosis not present

## 2018-07-13 DIAGNOSIS — I5031 Acute diastolic (congestive) heart failure: Secondary | ICD-10-CM | POA: Diagnosis not present

## 2018-07-13 DIAGNOSIS — R2689 Other abnormalities of gait and mobility: Secondary | ICD-10-CM | POA: Diagnosis not present

## 2018-07-14 DIAGNOSIS — J449 Chronic obstructive pulmonary disease, unspecified: Secondary | ICD-10-CM | POA: Diagnosis not present

## 2018-07-14 DIAGNOSIS — M6281 Muscle weakness (generalized): Secondary | ICD-10-CM | POA: Diagnosis not present

## 2018-07-14 DIAGNOSIS — R2689 Other abnormalities of gait and mobility: Secondary | ICD-10-CM | POA: Diagnosis not present

## 2018-07-14 DIAGNOSIS — Z741 Need for assistance with personal care: Secondary | ICD-10-CM | POA: Diagnosis not present

## 2018-07-14 DIAGNOSIS — R279 Unspecified lack of coordination: Secondary | ICD-10-CM | POA: Diagnosis not present

## 2018-07-14 DIAGNOSIS — I5031 Acute diastolic (congestive) heart failure: Secondary | ICD-10-CM | POA: Diagnosis not present

## 2018-07-15 ENCOUNTER — Encounter: Payer: Self-pay | Admitting: Internal Medicine

## 2018-07-15 ENCOUNTER — Non-Acute Institutional Stay (SKILLED_NURSING_FACILITY): Payer: Medicare Other | Admitting: Internal Medicine

## 2018-07-15 DIAGNOSIS — E1159 Type 2 diabetes mellitus with other circulatory complications: Secondary | ICD-10-CM | POA: Diagnosis not present

## 2018-07-15 DIAGNOSIS — Z794 Long term (current) use of insulin: Secondary | ICD-10-CM | POA: Diagnosis not present

## 2018-07-15 DIAGNOSIS — E118 Type 2 diabetes mellitus with unspecified complications: Secondary | ICD-10-CM | POA: Diagnosis not present

## 2018-07-15 DIAGNOSIS — I10 Essential (primary) hypertension: Secondary | ICD-10-CM | POA: Diagnosis not present

## 2018-07-15 DIAGNOSIS — F01518 Vascular dementia, unspecified severity, with other behavioral disturbance: Secondary | ICD-10-CM

## 2018-07-15 DIAGNOSIS — R279 Unspecified lack of coordination: Secondary | ICD-10-CM | POA: Diagnosis not present

## 2018-07-15 DIAGNOSIS — F0151 Vascular dementia with behavioral disturbance: Secondary | ICD-10-CM | POA: Diagnosis not present

## 2018-07-15 DIAGNOSIS — F322 Major depressive disorder, single episode, severe without psychotic features: Secondary | ICD-10-CM

## 2018-07-15 DIAGNOSIS — Z741 Need for assistance with personal care: Secondary | ICD-10-CM | POA: Diagnosis not present

## 2018-07-15 DIAGNOSIS — IMO0001 Reserved for inherently not codable concepts without codable children: Secondary | ICD-10-CM

## 2018-07-15 DIAGNOSIS — J449 Chronic obstructive pulmonary disease, unspecified: Secondary | ICD-10-CM | POA: Diagnosis not present

## 2018-07-15 DIAGNOSIS — M6281 Muscle weakness (generalized): Secondary | ICD-10-CM | POA: Diagnosis not present

## 2018-07-15 DIAGNOSIS — I5031 Acute diastolic (congestive) heart failure: Secondary | ICD-10-CM | POA: Diagnosis not present

## 2018-07-15 DIAGNOSIS — R2689 Other abnormalities of gait and mobility: Secondary | ICD-10-CM | POA: Diagnosis not present

## 2018-07-15 DIAGNOSIS — I480 Paroxysmal atrial fibrillation: Secondary | ICD-10-CM | POA: Diagnosis not present
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
## 2018-07-16 DIAGNOSIS — I5031 Acute diastolic (congestive) heart failure: Secondary | ICD-10-CM | POA: Diagnosis not present

## 2018-07-16 DIAGNOSIS — M6281 Muscle weakness (generalized): Secondary | ICD-10-CM | POA: Diagnosis not present

## 2018-07-16 DIAGNOSIS — F320 Major depressive disorder, single episode, mild: Secondary | ICD-10-CM | POA: Diagnosis not present

## 2018-07-16 DIAGNOSIS — R2689 Other abnormalities of gait and mobility: Secondary | ICD-10-CM | POA: Diagnosis not present

## 2018-07-16 DIAGNOSIS — F0150 Vascular dementia without behavioral disturbance: Secondary | ICD-10-CM | POA: Diagnosis not present

## 2018-07-16 DIAGNOSIS — Z741 Need for assistance with personal care: Secondary | ICD-10-CM | POA: Diagnosis not present

## 2018-07-16 DIAGNOSIS — R279 Unspecified lack of coordination: Secondary | ICD-10-CM | POA: Diagnosis not present

## 2018-07-16 DIAGNOSIS — F5105 Insomnia due to other mental disorder: Secondary | ICD-10-CM | POA: Diagnosis not present

## 2018-07-16 DIAGNOSIS — J449 Chronic obstructive pulmonary disease, unspecified: Secondary | ICD-10-CM | POA: Diagnosis not present

## 2018-07-20 DIAGNOSIS — J449 Chronic obstructive pulmonary disease, unspecified: Secondary | ICD-10-CM | POA: Diagnosis not present

## 2018-07-20 DIAGNOSIS — E1159 Type 2 diabetes mellitus with other circulatory complications: Secondary | ICD-10-CM | POA: Diagnosis not present

## 2018-07-20 DIAGNOSIS — F0280 Dementia in other diseases classified elsewhere without behavioral disturbance: Secondary | ICD-10-CM | POA: Diagnosis not present

## 2018-07-20 DIAGNOSIS — I5031 Acute diastolic (congestive) heart failure: Secondary | ICD-10-CM | POA: Diagnosis not present

## 2018-07-20 DIAGNOSIS — I5032 Chronic diastolic (congestive) heart failure: Secondary | ICD-10-CM | POA: Diagnosis not present

## 2018-07-20 DIAGNOSIS — R488 Other symbolic dysfunctions: Secondary | ICD-10-CM | POA: Diagnosis not present

## 2018-07-20 DIAGNOSIS — L89899 Pressure ulcer of other site, unspecified stage: Secondary | ICD-10-CM | POA: Diagnosis not present

## 2018-07-20 DIAGNOSIS — M6281 Muscle weakness (generalized): Secondary | ICD-10-CM | POA: Diagnosis not present

## 2018-07-20 DIAGNOSIS — F0390 Unspecified dementia without behavioral disturbance: Secondary | ICD-10-CM | POA: Diagnosis not present

## 2018-07-22 DIAGNOSIS — M6281 Muscle weakness (generalized): Secondary | ICD-10-CM | POA: Diagnosis not present

## 2018-07-22 DIAGNOSIS — J449 Chronic obstructive pulmonary disease, unspecified: Secondary | ICD-10-CM | POA: Diagnosis not present

## 2018-07-22 DIAGNOSIS — F0280 Dementia in other diseases classified elsewhere without behavioral disturbance: Secondary | ICD-10-CM | POA: Diagnosis not present

## 2018-07-22 DIAGNOSIS — R488 Other symbolic dysfunctions: Secondary | ICD-10-CM | POA: Diagnosis not present

## 2018-07-22 DIAGNOSIS — I5031 Acute diastolic (congestive) heart failure: Secondary | ICD-10-CM | POA: Diagnosis not present

## 2018-07-22 DIAGNOSIS — F0390 Unspecified dementia without behavioral disturbance: Secondary | ICD-10-CM | POA: Diagnosis not present

## 2018-07-23 DIAGNOSIS — M6281 Muscle weakness (generalized): Secondary | ICD-10-CM | POA: Diagnosis not present

## 2018-07-23 DIAGNOSIS — I5031 Acute diastolic (congestive) heart failure: Secondary | ICD-10-CM | POA: Diagnosis not present

## 2018-07-23 DIAGNOSIS — J449 Chronic obstructive pulmonary disease, unspecified: Secondary | ICD-10-CM | POA: Diagnosis not present

## 2018-07-23 DIAGNOSIS — R488 Other symbolic dysfunctions: Secondary | ICD-10-CM | POA: Diagnosis not present

## 2018-07-23 DIAGNOSIS — F0390 Unspecified dementia without behavioral disturbance: Secondary | ICD-10-CM | POA: Diagnosis not present

## 2018-07-23 DIAGNOSIS — F0280 Dementia in other diseases classified elsewhere without behavioral disturbance: Secondary | ICD-10-CM | POA: Diagnosis not present

## 2018-07-25 DIAGNOSIS — F0280 Dementia in other diseases classified elsewhere without behavioral disturbance: Secondary | ICD-10-CM | POA: Diagnosis not present

## 2018-07-25 DIAGNOSIS — R488 Other symbolic dysfunctions: Secondary | ICD-10-CM | POA: Diagnosis not present

## 2018-07-25 DIAGNOSIS — I5031 Acute diastolic (congestive) heart failure: Secondary | ICD-10-CM | POA: Diagnosis not present

## 2018-07-25 DIAGNOSIS — F0390 Unspecified dementia without behavioral disturbance: Secondary | ICD-10-CM | POA: Diagnosis not present

## 2018-07-25 DIAGNOSIS — J449 Chronic obstructive pulmonary disease, unspecified: Secondary | ICD-10-CM | POA: Diagnosis not present

## 2018-07-25 DIAGNOSIS — M6281 Muscle weakness (generalized): Secondary | ICD-10-CM | POA: Diagnosis not present

## 2018-07-26 DIAGNOSIS — J449 Chronic obstructive pulmonary disease, unspecified: Secondary | ICD-10-CM | POA: Diagnosis not present

## 2018-07-26 DIAGNOSIS — F0390 Unspecified dementia without behavioral disturbance: Secondary | ICD-10-CM | POA: Diagnosis not present

## 2018-07-26 DIAGNOSIS — F0280 Dementia in other diseases classified elsewhere without behavioral disturbance: Secondary | ICD-10-CM | POA: Diagnosis not present

## 2018-07-26 DIAGNOSIS — I5031 Acute diastolic (congestive) heart failure: Secondary | ICD-10-CM | POA: Diagnosis not present

## 2018-07-26 DIAGNOSIS — M6281 Muscle weakness (generalized): Secondary | ICD-10-CM | POA: Diagnosis not present

## 2018-07-26 DIAGNOSIS — R488 Other symbolic dysfunctions: Secondary | ICD-10-CM | POA: Diagnosis not present

## 2018-07-27 DIAGNOSIS — I5032 Chronic diastolic (congestive) heart failure: Secondary | ICD-10-CM | POA: Diagnosis not present

## 2018-07-27 DIAGNOSIS — M6281 Muscle weakness (generalized): Secondary | ICD-10-CM | POA: Diagnosis not present

## 2018-07-27 DIAGNOSIS — F0390 Unspecified dementia without behavioral disturbance: Secondary | ICD-10-CM | POA: Diagnosis not present

## 2018-07-27 DIAGNOSIS — L89899 Pressure ulcer of other site, unspecified stage: Secondary | ICD-10-CM | POA: Diagnosis not present

## 2018-07-27 DIAGNOSIS — F0280 Dementia in other diseases classified elsewhere without behavioral disturbance: Secondary | ICD-10-CM | POA: Diagnosis not present

## 2018-07-27 DIAGNOSIS — I5031 Acute diastolic (congestive) heart failure: Secondary | ICD-10-CM | POA: Diagnosis not present

## 2018-07-27 DIAGNOSIS — E1159 Type 2 diabetes mellitus with other circulatory complications: Secondary | ICD-10-CM | POA: Diagnosis not present

## 2018-07-27 DIAGNOSIS — R488 Other symbolic dysfunctions: Secondary | ICD-10-CM | POA: Diagnosis not present

## 2018-07-27 DIAGNOSIS — J449 Chronic obstructive pulmonary disease, unspecified: Secondary | ICD-10-CM | POA: Diagnosis not present

## 2018-07-28 DIAGNOSIS — I5031 Acute diastolic (congestive) heart failure: Secondary | ICD-10-CM | POA: Diagnosis not present

## 2018-07-28 DIAGNOSIS — R488 Other symbolic dysfunctions: Secondary | ICD-10-CM | POA: Diagnosis not present

## 2018-07-28 DIAGNOSIS — M6281 Muscle weakness (generalized): Secondary | ICD-10-CM | POA: Diagnosis not present

## 2018-07-28 DIAGNOSIS — J449 Chronic obstructive pulmonary disease, unspecified: Secondary | ICD-10-CM | POA: Diagnosis not present

## 2018-07-28 DIAGNOSIS — F0280 Dementia in other diseases classified elsewhere without behavioral disturbance: Secondary | ICD-10-CM | POA: Diagnosis not present

## 2018-07-28 DIAGNOSIS — F0390 Unspecified dementia without behavioral disturbance: Secondary | ICD-10-CM | POA: Diagnosis not present

## 2018-07-29 DIAGNOSIS — F5109 Other insomnia not due to a substance or known physiological condition: Secondary | ICD-10-CM | POA: Diagnosis not present

## 2018-07-29 DIAGNOSIS — F320 Major depressive disorder, single episode, mild: Secondary | ICD-10-CM | POA: Diagnosis not present

## 2018-07-29 DIAGNOSIS — M6281 Muscle weakness (generalized): Secondary | ICD-10-CM | POA: Diagnosis not present

## 2018-07-29 DIAGNOSIS — F0150 Vascular dementia without behavioral disturbance: Secondary | ICD-10-CM | POA: Diagnosis not present

## 2018-07-29 DIAGNOSIS — I5031 Acute diastolic (congestive) heart failure: Secondary | ICD-10-CM | POA: Diagnosis not present

## 2018-07-29 DIAGNOSIS — J449 Chronic obstructive pulmonary disease, unspecified: Secondary | ICD-10-CM | POA: Diagnosis not present

## 2018-07-29 DIAGNOSIS — F0390 Unspecified dementia without behavioral disturbance: Secondary | ICD-10-CM | POA: Diagnosis not present

## 2018-07-29 DIAGNOSIS — R488 Other symbolic dysfunctions: Secondary | ICD-10-CM | POA: Diagnosis not present

## 2018-07-29 DIAGNOSIS — F0280 Dementia in other diseases classified elsewhere without behavioral disturbance: Secondary | ICD-10-CM | POA: Diagnosis not present

## 2018-07-30 DIAGNOSIS — M6281 Muscle weakness (generalized): Secondary | ICD-10-CM | POA: Diagnosis not present

## 2018-07-30 DIAGNOSIS — F0280 Dementia in other diseases classified elsewhere without behavioral disturbance: Secondary | ICD-10-CM | POA: Diagnosis not present

## 2018-07-30 DIAGNOSIS — R488 Other symbolic dysfunctions: Secondary | ICD-10-CM | POA: Diagnosis not present

## 2018-07-30 DIAGNOSIS — I5031 Acute diastolic (congestive) heart failure: Secondary | ICD-10-CM | POA: Diagnosis not present

## 2018-07-30 DIAGNOSIS — J449 Chronic obstructive pulmonary disease, unspecified: Secondary | ICD-10-CM | POA: Diagnosis not present

## 2018-07-30 DIAGNOSIS — F0390 Unspecified dementia without behavioral disturbance: Secondary | ICD-10-CM | POA: Diagnosis not present

## 2018-08-02 DIAGNOSIS — F0280 Dementia in other diseases classified elsewhere without behavioral disturbance: Secondary | ICD-10-CM | POA: Diagnosis not present

## 2018-08-02 DIAGNOSIS — J449 Chronic obstructive pulmonary disease, unspecified: Secondary | ICD-10-CM | POA: Diagnosis not present

## 2018-08-02 DIAGNOSIS — F0390 Unspecified dementia without behavioral disturbance: Secondary | ICD-10-CM | POA: Diagnosis not present

## 2018-08-02 DIAGNOSIS — M6281 Muscle weakness (generalized): Secondary | ICD-10-CM | POA: Diagnosis not present

## 2018-08-02 DIAGNOSIS — I5031 Acute diastolic (congestive) heart failure: Secondary | ICD-10-CM | POA: Diagnosis not present

## 2018-08-02 DIAGNOSIS — R488 Other symbolic dysfunctions: Secondary | ICD-10-CM | POA: Diagnosis not present

## 2018-08-03 DIAGNOSIS — J449 Chronic obstructive pulmonary disease, unspecified: Secondary | ICD-10-CM | POA: Diagnosis not present

## 2018-08-03 DIAGNOSIS — E1159 Type 2 diabetes mellitus with other circulatory complications: Secondary | ICD-10-CM | POA: Diagnosis not present

## 2018-08-03 DIAGNOSIS — F0390 Unspecified dementia without behavioral disturbance: Secondary | ICD-10-CM | POA: Diagnosis not present

## 2018-08-03 DIAGNOSIS — M6281 Muscle weakness (generalized): Secondary | ICD-10-CM | POA: Diagnosis not present

## 2018-08-03 DIAGNOSIS — F0280 Dementia in other diseases classified elsewhere without behavioral disturbance: Secondary | ICD-10-CM | POA: Diagnosis not present

## 2018-08-03 DIAGNOSIS — R488 Other symbolic dysfunctions: Secondary | ICD-10-CM | POA: Diagnosis not present

## 2018-08-03 DIAGNOSIS — I5032 Chronic diastolic (congestive) heart failure: Secondary | ICD-10-CM | POA: Diagnosis not present

## 2018-08-03 DIAGNOSIS — L89899 Pressure ulcer of other site, unspecified stage: Secondary | ICD-10-CM | POA: Diagnosis not present

## 2018-08-03 DIAGNOSIS — I5031 Acute diastolic (congestive) heart failure: Secondary | ICD-10-CM | POA: Diagnosis not present

## 2018-08-04 DIAGNOSIS — I5031 Acute diastolic (congestive) heart failure: Secondary | ICD-10-CM | POA: Diagnosis not present

## 2018-08-04 DIAGNOSIS — J449 Chronic obstructive pulmonary disease, unspecified: Secondary | ICD-10-CM | POA: Diagnosis not present

## 2018-08-04 DIAGNOSIS — R488 Other symbolic dysfunctions: Secondary | ICD-10-CM | POA: Diagnosis not present

## 2018-08-04 DIAGNOSIS — M6281 Muscle weakness (generalized): Secondary | ICD-10-CM | POA: Diagnosis not present

## 2018-08-04 DIAGNOSIS — F0390 Unspecified dementia without behavioral disturbance: Secondary | ICD-10-CM | POA: Diagnosis not present

## 2018-08-04 DIAGNOSIS — F0280 Dementia in other diseases classified elsewhere without behavioral disturbance: Secondary | ICD-10-CM | POA: Diagnosis not present

## 2018-08-05 DIAGNOSIS — M6281 Muscle weakness (generalized): Secondary | ICD-10-CM | POA: Diagnosis not present

## 2018-08-05 DIAGNOSIS — R488 Other symbolic dysfunctions: Secondary | ICD-10-CM | POA: Diagnosis not present

## 2018-08-05 DIAGNOSIS — J449 Chronic obstructive pulmonary disease, unspecified: Secondary | ICD-10-CM | POA: Diagnosis not present

## 2018-08-05 DIAGNOSIS — F0280 Dementia in other diseases classified elsewhere without behavioral disturbance: Secondary | ICD-10-CM | POA: Diagnosis not present

## 2018-08-05 DIAGNOSIS — I5031 Acute diastolic (congestive) heart failure: Secondary | ICD-10-CM | POA: Diagnosis not present

## 2018-08-05 DIAGNOSIS — F0390 Unspecified dementia without behavioral disturbance: Secondary | ICD-10-CM | POA: Diagnosis not present

## 2018-08-06 DIAGNOSIS — I5031 Acute diastolic (congestive) heart failure: Secondary | ICD-10-CM | POA: Diagnosis not present

## 2018-08-06 DIAGNOSIS — M6281 Muscle weakness (generalized): Secondary | ICD-10-CM | POA: Diagnosis not present

## 2018-08-06 DIAGNOSIS — C44212 Basal cell carcinoma of skin of right ear and external auricular canal: Secondary | ICD-10-CM | POA: Diagnosis not present

## 2018-08-06 DIAGNOSIS — F0390 Unspecified dementia without behavioral disturbance: Secondary | ICD-10-CM | POA: Diagnosis not present

## 2018-08-06 DIAGNOSIS — R488 Other symbolic dysfunctions: Secondary | ICD-10-CM | POA: Diagnosis not present

## 2018-08-06 DIAGNOSIS — F0280 Dementia in other diseases classified elsewhere without behavioral disturbance: Secondary | ICD-10-CM | POA: Diagnosis not present

## 2018-08-06 DIAGNOSIS — J449 Chronic obstructive pulmonary disease, unspecified: Secondary | ICD-10-CM | POA: Diagnosis not present

## 2018-08-09 DIAGNOSIS — M6281 Muscle weakness (generalized): Secondary | ICD-10-CM | POA: Diagnosis not present

## 2018-08-09 DIAGNOSIS — J449 Chronic obstructive pulmonary disease, unspecified: Secondary | ICD-10-CM | POA: Diagnosis not present

## 2018-08-09 DIAGNOSIS — F0280 Dementia in other diseases classified elsewhere without behavioral disturbance: Secondary | ICD-10-CM | POA: Diagnosis not present

## 2018-08-09 DIAGNOSIS — F0390 Unspecified dementia without behavioral disturbance: Secondary | ICD-10-CM | POA: Diagnosis not present

## 2018-08-09 DIAGNOSIS — R488 Other symbolic dysfunctions: Secondary | ICD-10-CM | POA: Diagnosis not present

## 2018-08-09 DIAGNOSIS — I5031 Acute diastolic (congestive) heart failure: Secondary | ICD-10-CM | POA: Diagnosis not present

## 2018-08-10 DIAGNOSIS — F0390 Unspecified dementia without behavioral disturbance: Secondary | ICD-10-CM | POA: Diagnosis not present

## 2018-08-10 DIAGNOSIS — F0280 Dementia in other diseases classified elsewhere without behavioral disturbance: Secondary | ICD-10-CM | POA: Diagnosis not present

## 2018-08-10 DIAGNOSIS — J449 Chronic obstructive pulmonary disease, unspecified: Secondary | ICD-10-CM | POA: Diagnosis not present

## 2018-08-10 DIAGNOSIS — R488 Other symbolic dysfunctions: Secondary | ICD-10-CM | POA: Diagnosis not present

## 2018-08-10 DIAGNOSIS — M6281 Muscle weakness (generalized): Secondary | ICD-10-CM | POA: Diagnosis not present

## 2018-08-10 DIAGNOSIS — I5031 Acute diastolic (congestive) heart failure: Secondary | ICD-10-CM | POA: Diagnosis not present

## 2018-08-11 DIAGNOSIS — E1159 Type 2 diabetes mellitus with other circulatory complications: Secondary | ICD-10-CM | POA: Diagnosis not present

## 2018-08-11 DIAGNOSIS — I5032 Chronic diastolic (congestive) heart failure: Secondary | ICD-10-CM | POA: Diagnosis not present

## 2018-08-11 DIAGNOSIS — I5031 Acute diastolic (congestive) heart failure: Secondary | ICD-10-CM | POA: Diagnosis not present

## 2018-08-11 DIAGNOSIS — J449 Chronic obstructive pulmonary disease, unspecified: Secondary | ICD-10-CM | POA: Diagnosis not present

## 2018-08-11 DIAGNOSIS — R488 Other symbolic dysfunctions: Secondary | ICD-10-CM | POA: Diagnosis not present

## 2018-08-11 DIAGNOSIS — L89899 Pressure ulcer of other site, unspecified stage: Secondary | ICD-10-CM | POA: Diagnosis not present

## 2018-08-11 DIAGNOSIS — M6281 Muscle weakness (generalized): Secondary | ICD-10-CM | POA: Diagnosis not present

## 2018-08-11 DIAGNOSIS — F0390 Unspecified dementia without behavioral disturbance: Secondary | ICD-10-CM | POA: Diagnosis not present

## 2018-08-11 DIAGNOSIS — F0280 Dementia in other diseases classified elsewhere without behavioral disturbance: Secondary | ICD-10-CM | POA: Diagnosis not present

## 2018-08-12 DIAGNOSIS — F0390 Unspecified dementia without behavioral disturbance: Secondary | ICD-10-CM | POA: Diagnosis not present

## 2018-08-12 DIAGNOSIS — R488 Other symbolic dysfunctions: Secondary | ICD-10-CM | POA: Diagnosis not present

## 2018-08-12 DIAGNOSIS — I5031 Acute diastolic (congestive) heart failure: Secondary | ICD-10-CM | POA: Diagnosis not present

## 2018-08-12 DIAGNOSIS — F0280 Dementia in other diseases classified elsewhere without behavioral disturbance: Secondary | ICD-10-CM | POA: Diagnosis not present

## 2018-08-12 DIAGNOSIS — J449 Chronic obstructive pulmonary disease, unspecified: Secondary | ICD-10-CM | POA: Diagnosis not present

## 2018-08-12 DIAGNOSIS — M6281 Muscle weakness (generalized): Secondary | ICD-10-CM | POA: Diagnosis not present

## 2018-08-13 DIAGNOSIS — I5031 Acute diastolic (congestive) heart failure: Secondary | ICD-10-CM | POA: Diagnosis not present

## 2018-08-13 DIAGNOSIS — F0280 Dementia in other diseases classified elsewhere without behavioral disturbance: Secondary | ICD-10-CM | POA: Diagnosis not present

## 2018-08-13 DIAGNOSIS — M6281 Muscle weakness (generalized): Secondary | ICD-10-CM | POA: Diagnosis not present

## 2018-08-13 DIAGNOSIS — F0390 Unspecified dementia without behavioral disturbance: Secondary | ICD-10-CM | POA: Diagnosis not present

## 2018-08-13 DIAGNOSIS — J449 Chronic obstructive pulmonary disease, unspecified: Secondary | ICD-10-CM | POA: Diagnosis not present

## 2018-08-13 DIAGNOSIS — R488 Other symbolic dysfunctions: Secondary | ICD-10-CM | POA: Diagnosis not present

## 2018-08-16 DIAGNOSIS — M6281 Muscle weakness (generalized): Secondary | ICD-10-CM | POA: Diagnosis not present

## 2018-08-16 DIAGNOSIS — J449 Chronic obstructive pulmonary disease, unspecified: Secondary | ICD-10-CM | POA: Diagnosis not present

## 2018-08-16 DIAGNOSIS — I5031 Acute diastolic (congestive) heart failure: Secondary | ICD-10-CM | POA: Diagnosis not present

## 2018-08-16 DIAGNOSIS — F0390 Unspecified dementia without behavioral disturbance: Secondary | ICD-10-CM | POA: Diagnosis not present

## 2018-08-16 DIAGNOSIS — F0280 Dementia in other diseases classified elsewhere without behavioral disturbance: Secondary | ICD-10-CM | POA: Diagnosis not present

## 2018-08-16 DIAGNOSIS — R488 Other symbolic dysfunctions: Secondary | ICD-10-CM | POA: Diagnosis not present

## 2018-08-17 DIAGNOSIS — F0390 Unspecified dementia without behavioral disturbance: Secondary | ICD-10-CM | POA: Diagnosis not present

## 2018-08-17 DIAGNOSIS — R488 Other symbolic dysfunctions: Secondary | ICD-10-CM | POA: Diagnosis not present

## 2018-08-17 DIAGNOSIS — F0280 Dementia in other diseases classified elsewhere without behavioral disturbance: Secondary | ICD-10-CM | POA: Diagnosis not present

## 2018-08-17 DIAGNOSIS — I5031 Acute diastolic (congestive) heart failure: Secondary | ICD-10-CM | POA: Diagnosis not present

## 2018-08-17 DIAGNOSIS — M6281 Muscle weakness (generalized): Secondary | ICD-10-CM | POA: Diagnosis not present

## 2018-08-17 DIAGNOSIS — J449 Chronic obstructive pulmonary disease, unspecified: Secondary | ICD-10-CM | POA: Diagnosis not present

## 2018-08-18 ENCOUNTER — Encounter: Payer: Self-pay | Admitting: Adult Health

## 2018-08-18 ENCOUNTER — Non-Acute Institutional Stay (SKILLED_NURSING_FACILITY): Payer: Medicare Other | Admitting: Adult Health

## 2018-08-18 DIAGNOSIS — K219 Gastro-esophageal reflux disease without esophagitis: Secondary | ICD-10-CM | POA: Diagnosis not present

## 2018-08-18 DIAGNOSIS — I25119 Atherosclerotic heart disease of native coronary artery with unspecified angina pectoris: Secondary | ICD-10-CM | POA: Diagnosis not present

## 2018-08-18 DIAGNOSIS — I5031 Acute diastolic (congestive) heart failure: Secondary | ICD-10-CM | POA: Diagnosis not present

## 2018-08-18 DIAGNOSIS — E1159 Type 2 diabetes mellitus with other circulatory complications: Secondary | ICD-10-CM | POA: Diagnosis not present

## 2018-08-18 DIAGNOSIS — L89899 Pressure ulcer of other site, unspecified stage: Secondary | ICD-10-CM | POA: Diagnosis not present

## 2018-08-18 DIAGNOSIS — F0280 Dementia in other diseases classified elsewhere without behavioral disturbance: Secondary | ICD-10-CM | POA: Diagnosis not present

## 2018-08-18 DIAGNOSIS — J449 Chronic obstructive pulmonary disease, unspecified: Secondary | ICD-10-CM

## 2018-08-18 DIAGNOSIS — F0390 Unspecified dementia without behavioral disturbance: Secondary | ICD-10-CM | POA: Diagnosis not present

## 2018-08-18 DIAGNOSIS — I679 Cerebrovascular disease, unspecified: Secondary | ICD-10-CM

## 2018-08-18 DIAGNOSIS — I5032 Chronic diastolic (congestive) heart failure: Secondary | ICD-10-CM | POA: Diagnosis not present

## 2018-08-18 DIAGNOSIS — M6281 Muscle weakness (generalized): Secondary | ICD-10-CM | POA: Diagnosis not present

## 2018-08-18 DIAGNOSIS — R488 Other symbolic dysfunctions: Secondary | ICD-10-CM | POA: Diagnosis not present

## 2018-08-18 NOTE — Progress Notes (Signed)
Location:   [facility] Room Number: 221 A Place of Service:  SNF (31)   CODE STATUS: DNR  Allergies  Allergen Reactions  . Cyanocobalamin [Vitamin B12] Other (See Comments)    Leg swelling to gummy B12 vitamin  . Ampicillin Rash    Skin rash    Chief Complaint  Patient presents with  . Medical Management of Chronic Issues    Coronary artery disease involving native coronary artery of native heart with angina pectoris; cerebrovascular disease, unspecified; chronic obstructive pulmonary disease unspecified COPD type; gastroesophageal feflux disease without esophagitis.     HPI:  He is a 84 year old long term resident of this facility being seen for the management of her chronic illnesses: cad; cerebrovascular disease; copd; gerd. He is unable to fully participate in the hpi or ros. There are no reports of heart burn present; no changes in appetite; no uncontrolled pain.   Past Medical History:  Diagnosis Date  . Acute endocarditis   . Atrial fibrillation ([place])   . Bilateral hydrocele 3/11   [facility]  . Cerebrovascular disease, unspecified   . Cervicalgia   . Colon polyp   . COPD (chronic obstructive pulmonary disease) ([place]) 2017   dx during hospitalization  . Coronary atherosclerosis of unspecified type of vessel, native or graft    s/p CABG 2010  . Diabetes mellitus without complication ([place])   . Esophageal reflux   . Essential hypertension, benign   . Foraminal stenosis of cervical region    bilateral-C4-C5, C5-C6  . History of aortic stenosis    s/p valve replacment 2010  . Pure hypercholesterolemia   . Shingles 07/27/2012  . Stroke ([place])   . Type II or unspecified type diabetes mellitus with neurological manifestations, not stated as uncontrolled(250.60)   . 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 AORTIC VALVE REPLACEMENT   12/2008   with pericardial tissue valve  . CARDIAC SURGERY    . Carotid US  10/2009   B ICA stenosis, stable disease, rec rpt 2 years  . CATARACT EXTRACTION  1990's   bilateral  . CHOLECYSTECTOMY  1995  . COLONOSCOPY  [place]  . CORONARY ARTERY BYPASS GRAFT  3/10   x3-using a left internal mammary artery to the left anterior descending coronary artery, saphenous vein graft to circumflex marginal branch, spahenous vein graft  to posterior descendingcoronary artery. Endoscopic saphenous vein harvest from bilateral thighs was done.   Marland Kitchen HYDROCELE EXCISION     bilateral ([facility])  . L leg trauma  1957   truck over left leg  . PTCA  12/99   with stent  . sphincterectomy  09/20/03   for jaundice  . TEE WITHOUT CARDIOVERSION N/A 01/09/2016   Procedure: TRANSESOPHAGEAL ECHOCARDIOGRAM (TEE);  Surgeon: Dorothy Spark, MD;  Location: [place];  Service: Cardiovascular;  Laterality: N/A;  . TEE WITHOUT CARDIOVERSION N/A 01/15/2016   Procedure: TRANSESOPHAGEAL ECHOCARDIOGRAM (TEE);  Surgeon: Larey Dresser, MD;  Location: [place];  Service: Cardiovascular;  Laterality: N/A;  . TOTAL HIP ARTHROPLASTY Left 06/30/2014   Procedure: LEFT TOTAL HIP ARTHROPLASTY ANTERIOR APPROACH;  Surgeon: Mcarthur Rossetti, MD;  Location: [facility];  Service: Orthopedics;  Laterality: Left;    Social History   Socioeconomic History  . Marital status: Widowed    Spouse name: Not on file  . Number  of children: 3  . Years of education: Not on file  . Highest education level: Not on file  Occupational History  . 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 Financial resource strain: Not hard at all  . Food insecurity:    Worry: Never true    Inability: Never true  . Transportation needs:    Medical: No    Non-medical: No  Tobacco Use  . Smoking status: Former Smoker    Packs/day: 1.00    Years: 50.00    Pack years: 50.00    Types: Cigarettes    Last attempt  to quit: 10/20/1993    Years since quitting: 24.8  . Smokeless tobacco: Never Used  Substance and Sexual Activity  . Alcohol use: Yes    Alcohol/week: 1.0 standard drinks    Types: 1 Cans of beer per week    Comment: occasional  . Drug use: No  . Sexual activity: Never  Lifestyle  . Physical activity:    Days per week: 0 days    Minutes per session: 0 min  . Stress: Only a little  Relationships  . Social connections:    Talks on phone: Twice a week    Gets together: Twice a week    Attends religious service: Never    Active member of club or organization: No    Attends meetings of clubs or organizations: Never    Relationship status: Widowed  . Intimate partner violence:    Fear of current or ex partner: No    Emotionally abused: No    Physically abused: No    Forced sexual activity: No  Other Topics Concern  . Not on file  Social History Narrative  . Not on file   Family History  Problem Relation Age of Onset  . Heart attack Father 24  . Breast cancer Mother 6  . Brain cancer Sister   . Prostate cancer Brother   . Diabetes Neg Hx   . Stroke Neg Hx       VITAL SIGNS BP 102/60   Pulse 74   Temp 97.8 F (36.6 C)   Resp 16   Ht 5\' 9"  (1.753 m)   Wt 147 lb 6.4 oz (66.9 kg)   SpO2 99%   BMI 21.77 kg/m   Outpatient Encounter Medications as of 08/18/2018  Medication Sig  . acetaminophen (TYLENOL) 325 MG tablet Take 650 mg by mouth 3 (three) times daily.   Marland Kitchen amLODipine (NORVASC) 5 MG tablet Take 5 mg by mouth daily.   . diclofenac sodium (VOLTAREN) 1 % GEL Apply 2 g topically every 6 (six) hours as needed. Apply to left wrist   . diltiazem (CARDIZEM) 30 MG tablet Take 1 tablet (30 mg total) by mouth 2 (two) times daily.  Marland Kitchen ELIQUIS 2.5 MG TABS tablet TAKE 1 TABLET TWICE A DAY  . famotidine (PEPCID) 40 MG tablet Take 40 mg by mouth daily.  Marland Kitchen FLUoxetine (PROZAC) 20 MG tablet Take 20 mg by mouth daily.   . fluticasone (FLONASE) 50 MCG/ACT nasal spray Place 1 spray  into both nostrils daily as needed.   Marland Kitchen GLUCERNA (GLUCERNA) LIQD Give 120 ml by mouth 4 times daily  . insulin glargine (LANTUS) 100 UNIT/ML injection Inject 20 Units into the skin at bedtime.   Marland Kitchen ipratropium (ATROVENT) 0.02 % nebulizer solution Take 2.5 mLs (0.5 mg total) by nebulization every 2 (two) hours as needed for wheezing or shortness of breath (give with Xopenex).  Marland Kitchen  levalbuterol (XOPENEX) 0.63 MG/3ML nebulizer solution Take 3 mLs (0.63 mg total) by nebulization every 2 (two) hours as needed for wheezing or shortness of breath (give with atrovent).  . Multiple Vitamin (MULTIVITAMIN) tablet Take 1 tablet by mouth daily.  . nitroGLYCERIN (NITROSTAT) 0.4 MG SL tablet Place 1 tablet (0.4 mg total) under the tongue every 5 (five) minutes as needed. Call MD/NP if pain unrelieved.  . Nutritional Supplements (NUTRITIONAL SUPPLEMENT PO) Regular Diet - mech soft texture  . Nutritional Supplements (PROMOD) LIQD Take 30 mLs by mouth 2 (two) times daily.  . ondansetron (ZOFRAN) 4 MG tablet Take 4 mg by mouth every 6 (six) hours as needed for nausea or vomiting.  . OXYGEN Give 2 liters via nasal cannula as needed for sats below 90%  . potassium chloride SA (K-DUR,KLOR-CON) 20 MEQ tablet Take 1 tablet (20 mEq total) by mouth daily.  . sennosides-docusate sodium (SENOKOT-S) 8.6-50 MG tablet Take 1 tablet by mouth at bedtime. Hold for loose or liquid stools  . Simethicone 80 MG TABS 80 mg. Give 1 tablet by mouth before meals and at bedtime   . tetrahydrozoline-zinc (VISINE-AC) 0.05-0.25 % ophthalmic solution Place 2 drops into both eyes as needed (dry eyes).   Marland Kitchen tiotropium (SPIRIVA) 18 MCG inhalation capsule Place 18 mcg into inhaler and inhale daily.  . traZODone (DESYREL) 50 MG tablet Take 50 mg by mouth at bedtime.  . mirtazapine (REMERON) 7.5 MG tablet Take one tablet by mouth every other night for 2 weeks then D/C   No facility-administered encounter medications on file as of 08/18/2018.       SIGNIFICANT DIAGNOSTIC EXAMS  PREVIOUS:   10-25-17: chest x-ray: Postsurgical changes of CABG and AVR. Mild LEFT basilar atelectasis versus infiltrate.  10-25-17: ct of head: 1. No acute intracranial pathology.   10-25-17: ct of chest abdomen and pelvis:  1. Status post CABG with borderline cardiomegaly.  Status post AVR. 2. Trace bilateral pleural effusions with subpleural pneumonic consolidations in the posterior segment of right upper lobe, left lower lobe and along the periphery of the right middle lobe. Associated mild peribronchial thickening that may represent superimposed bronchitic change. 3. Colonic diverticulosis without acute diverticulitis. 4. Stable fusiform 3.3 cm infrarenal abdominal aortic aneurysm. 5. Thoracolumbar spondylosis. 6. Status post cholecystectomy. 7. Stable cyst off the posterior aspect of the left kidney measuring 14 mm. Punctate nonobstructing right renal calculus. 8. Enlarged prostate, stable in appearance.   10-26-17: 2-d echo: -  Left ventricle: The cavity size was normal. There was moderate  concentric hypertrophy. Systolic function was normal. The estimated ejection fraction was in the range of 55% to 60%. Wall motion was normal; there were no regional wall motion abnormalities. - Aortic valve: A bioprosthesis was present and functioning normally. - Mitral valve: Severely calcified annulus. There was mild regurgitation. - Pulmonary arteries: PA peak pressure: 38 mm Hg (S).  11-24-17: chest x-ray: left lower lobe airspace disease can be related to pneumonia or atelectasis. New since prior exam.   11-27-17: chest x-ray: findings suspicious for bronchopneumonia on the left. this is similar or mildly worse than before   12-07-17: FEES: excessive opening in the UES which was present 90% of swallows  01-02-18: chest x-ray: Low lung volumes. Improved left basilar aeration from prior exam. No new abnormality.  01-02-18: right shoulder x-ray: Chronic change about  the right shoulder without acute fracture or Dislocation.  01-02-18: ct of head and cervical spine:  CT HEAD: 1. No evidence of acute intracranial  abnormality. No evidence of calvarial fracture. 2. Generalized cerebral volume loss and moderate chronic small vessel ischemic changes in the cerebral white matter. 3. Stable right occipital lobe encephalomalacia. 4. Minimal left maxillary sinusitis, possibly acute. CT CERVICAL SPINE: 1. No fracture or acute malalignment in the cervical spine. 2. Advanced multilevel degenerative changes in the cervical spine as detailed. 3. Mild degenerative spondylolisthesis in the upper cervical spine is stable.  NO NEW EXAMS     LABS REVIEWED: PREVIOUS:   10-25-17: wbc 10.4; hgb 12.8; hct 39.5; mcv 95.9; plt 363; glucose 271; bun 22; creat 1.98; k+ 3.7; na++ 137; ca 9.1; liver normal albumin 3.3 10-26-17; tsh 1.046 10-27-17: wbc 6.8; hgb 10.5; hct 33.3; mcv 97.4; plt 307; glucose 167; bun 16; creat 1.48; k+ 3.6; na++139; ca 8.6  10-29-17: glucose 246; bun 25; creat 1.56; k+ 4.2; na++ 134; ca 8.6 11-24-17: wbc 10.0; hgb 12.2; hct 34.4; mcv 86.6 ;plt 137; glucose 229; bun 23.7; creat 1.55; k+ 2..8; na++128 ca 8.5    01-02-18: wbc 7.8; hgb 9.9; hct 30.7; mcv 93.0; plt 217; glucose 119; bun 14; creat 1.23; k+ 2.9; na++ 136; ca 8.2 01-03-18: hgb 11.2; hct 33.0; glucose 107; bun 14; creat 1.10; k+ 3.6; na++ 139 ionized ca 1.05;  01-20-18: glucose 197; bun 19; creat 1.2; k+ 3.7; na++ 140; liver normal vit B 12: 527; iron 47; tibc 189; 02-17-18: hgb a1c 6.5 03-25-18: urine micro-albumin 1.2   NO NEW LABS.    Review of Systems  Unable to perform ROS: Dementia (confusion )    Physical Exam  Constitutional: He appears well-developed and well-nourished. No distress.  Neck: No thyromegaly present.  Cardiovascular: Normal rate, regular rhythm and intact distal pulses.  Murmur heard. 1/6  Pulmonary/Chest: Effort normal and breath sounds normal. No respiratory distress.   Abdominal: Soft. Bowel sounds are normal. He exhibits no distension. There is no tenderness.  Musculoskeletal: He exhibits no edema.  Is able to move all extremities  Uses wheelchair   Lymphadenopathy:    He has no cervical adenopathy.  Neurological: He is alert.  Skin: Skin is warm and dry. He is not diaphoretic.  Psychiatric: He has a normal mood and affect.      ASSESSMENT/ PLAN:  TODAY:   1. CAD, native coronary artery: is status post CABG and status post acute endocarditis: is stable will continue prn ntg  2. Stroke: is neurologically stable: is on long term eliquis 2.5 mg twice daily   3. COPD: is stable will continue as needed atrovent neb and xopenex neb every 2 hours as needed will continue spiriva   flonase daily for allergies   4.  GERD without esophagitis: stable will continue pepcid 40 mg daily  PREVIOUS  5. Essential hypertension: stable; b/p 102/60: will continue norvasc 5 mg daily and will monitor his status.    6. Insulin dependent diabetes mellitus with complications: hgb M3T 7.9; stable will continue lantus 20 units nightly     7. Vascular dementia with behavioral disturbance: he is continuing to lose weight over time; his aricept has been stopped due to his weight loss. His weight is presently 146 lb is presently stable.   8. CKD stage III: stable bun 19 creat 1.2  9. Iron deficiency anemia: hgb 11.2 will monitor  10. Major depression disorder, single episode, severe, without psychotic features: stable will continue prozac 40 mg daily and trazodone 50 mg nightly   11. Chronic constipation: stable will continue senna s nightly  12. Atrial fibrillation with RVR: heart rate stable is taking Cardizem 30 mg twice daily for rate control will continue eliquis 2.5 mg twice daily  13. Chronic diastolic heart failure: stable EF 55-60% ( 10-26-17):is off lasix will not make changes will monitor his status.   14. Hypokalemia: stable k+ 3.7; will continue k+ 20  meq daily   15. Dyslipidemia associated with type 2 diabetes: is presently off medications.       MD is aware of resident's narcotic use and is in agreement with current plan of care. We will attempt to wean resident as apropriate   Ok Edwards NP [facility]  Contact [phone] Monday through Friday 8am- 5pm  After hours call [phone]

## 2018-08-19 DIAGNOSIS — I5031 Acute diastolic (congestive) heart failure: Secondary | ICD-10-CM | POA: Diagnosis not present

## 2018-08-19 DIAGNOSIS — B351 Tinea unguium: Secondary | ICD-10-CM | POA: Diagnosis not present

## 2018-08-19 DIAGNOSIS — E1051 Type 1 diabetes mellitus with diabetic peripheral angiopathy without gangrene: Secondary | ICD-10-CM | POA: Diagnosis not present

## 2018-08-19 DIAGNOSIS — F0280 Dementia in other diseases classified elsewhere without behavioral disturbance: Secondary | ICD-10-CM | POA: Diagnosis not present

## 2018-08-19 DIAGNOSIS — M6281 Muscle weakness (generalized): Secondary | ICD-10-CM | POA: Diagnosis not present

## 2018-08-19 DIAGNOSIS — F0390 Unspecified dementia without behavioral disturbance: Secondary | ICD-10-CM | POA: Diagnosis not present

## 2018-08-19 DIAGNOSIS — J449 Chronic obstructive pulmonary disease, unspecified: Secondary | ICD-10-CM | POA: Diagnosis not present

## 2018-08-19 DIAGNOSIS — R488 Other symbolic dysfunctions: Secondary | ICD-10-CM | POA: Diagnosis not present

## 2018-08-19 DIAGNOSIS — Z794 Long term (current) use of insulin: Secondary | ICD-10-CM | POA: Diagnosis not present

## 2018-08-20 DIAGNOSIS — R488 Other symbolic dysfunctions: Secondary | ICD-10-CM | POA: Diagnosis not present

## 2018-08-20 DIAGNOSIS — F0280 Dementia in other diseases classified elsewhere without behavioral disturbance: Secondary | ICD-10-CM | POA: Diagnosis not present

## 2018-08-20 DIAGNOSIS — I5031 Acute diastolic (congestive) heart failure: Secondary | ICD-10-CM | POA: Diagnosis not present

## 2018-08-20 DIAGNOSIS — M6281 Muscle weakness (generalized): Secondary | ICD-10-CM | POA: Diagnosis not present

## 2018-08-20 DIAGNOSIS — J449 Chronic obstructive pulmonary disease, unspecified: Secondary | ICD-10-CM | POA: Diagnosis not present

## 2018-08-20 DIAGNOSIS — F0390 Unspecified dementia without behavioral disturbance: Secondary | ICD-10-CM | POA: Diagnosis not present

## 2018-08-22 DIAGNOSIS — R488 Other symbolic dysfunctions: Secondary | ICD-10-CM | POA: Diagnosis not present

## 2018-08-22 DIAGNOSIS — J449 Chronic obstructive pulmonary disease, unspecified: Secondary | ICD-10-CM | POA: Diagnosis not present

## 2018-08-22 DIAGNOSIS — I5031 Acute diastolic (congestive) heart failure: Secondary | ICD-10-CM | POA: Diagnosis not present

## 2018-08-22 DIAGNOSIS — M6281 Muscle weakness (generalized): Secondary | ICD-10-CM | POA: Diagnosis not present

## 2018-08-22 DIAGNOSIS — F0390 Unspecified dementia without behavioral disturbance: Secondary | ICD-10-CM | POA: Diagnosis not present

## 2018-08-22 DIAGNOSIS — F0280 Dementia in other diseases classified elsewhere without behavioral disturbance: Secondary | ICD-10-CM | POA: Diagnosis not present

## 2018-08-23 DIAGNOSIS — F0280 Dementia in other diseases classified elsewhere without behavioral disturbance: Secondary | ICD-10-CM | POA: Diagnosis not present

## 2018-08-23 DIAGNOSIS — M6281 Muscle weakness (generalized): Secondary | ICD-10-CM | POA: Diagnosis not present

## 2018-08-23 DIAGNOSIS — R488 Other symbolic dysfunctions: Secondary | ICD-10-CM | POA: Diagnosis not present

## 2018-08-23 DIAGNOSIS — I5031 Acute diastolic (congestive) heart failure: Secondary | ICD-10-CM | POA: Diagnosis not present

## 2018-08-23 DIAGNOSIS — J449 Chronic obstructive pulmonary disease, unspecified: Secondary | ICD-10-CM | POA: Diagnosis not present

## 2018-08-23 DIAGNOSIS — F0390 Unspecified dementia without behavioral disturbance: Secondary | ICD-10-CM | POA: Diagnosis not present

## 2018-08-24 DIAGNOSIS — J449 Chronic obstructive pulmonary disease, unspecified: Secondary | ICD-10-CM | POA: Diagnosis not present

## 2018-08-24 DIAGNOSIS — R488 Other symbolic dysfunctions: Secondary | ICD-10-CM | POA: Diagnosis not present

## 2018-08-24 DIAGNOSIS — M6281 Muscle weakness (generalized): Secondary | ICD-10-CM | POA: Diagnosis not present

## 2018-08-24 DIAGNOSIS — I5031 Acute diastolic (congestive) heart failure: Secondary | ICD-10-CM | POA: Diagnosis not present

## 2018-08-24 DIAGNOSIS — F0280 Dementia in other diseases classified elsewhere without behavioral disturbance: Secondary | ICD-10-CM | POA: Diagnosis not present

## 2018-08-24 DIAGNOSIS — F0390 Unspecified dementia without behavioral disturbance: Secondary | ICD-10-CM | POA: Diagnosis not present

## 2018-08-25 DIAGNOSIS — I4811 Longstanding persistent atrial fibrillation: Secondary | ICD-10-CM | POA: Diagnosis not present

## 2018-08-25 DIAGNOSIS — J449 Chronic obstructive pulmonary disease, unspecified: Secondary | ICD-10-CM | POA: Diagnosis not present

## 2018-08-25 DIAGNOSIS — R488 Other symbolic dysfunctions: Secondary | ICD-10-CM | POA: Diagnosis not present

## 2018-08-25 DIAGNOSIS — I5031 Acute diastolic (congestive) heart failure: Secondary | ICD-10-CM | POA: Diagnosis not present

## 2018-08-25 DIAGNOSIS — I5032 Chronic diastolic (congestive) heart failure: Secondary | ICD-10-CM | POA: Diagnosis not present

## 2018-08-25 DIAGNOSIS — F0390 Unspecified dementia without behavioral disturbance: Secondary | ICD-10-CM | POA: Diagnosis not present

## 2018-08-25 DIAGNOSIS — I10 Essential (primary) hypertension: Secondary | ICD-10-CM | POA: Diagnosis not present

## 2018-08-25 DIAGNOSIS — M6281 Muscle weakness (generalized): Secondary | ICD-10-CM | POA: Diagnosis not present

## 2018-08-25 DIAGNOSIS — F0280 Dementia in other diseases classified elsewhere without behavioral disturbance: Secondary | ICD-10-CM | POA: Diagnosis not present

## 2018-08-25 DIAGNOSIS — I2510 Atherosclerotic heart disease of native coronary artery without angina pectoris: Secondary | ICD-10-CM | POA: Diagnosis not present

## 2018-08-26 DIAGNOSIS — M6281 Muscle weakness (generalized): Secondary | ICD-10-CM | POA: Diagnosis not present

## 2018-08-26 DIAGNOSIS — F0280 Dementia in other diseases classified elsewhere without behavioral disturbance: Secondary | ICD-10-CM | POA: Diagnosis not present

## 2018-08-26 DIAGNOSIS — I5031 Acute diastolic (congestive) heart failure: Secondary | ICD-10-CM | POA: Diagnosis not present

## 2018-08-26 DIAGNOSIS — J449 Chronic obstructive pulmonary disease, unspecified: Secondary | ICD-10-CM | POA: Diagnosis not present

## 2018-08-26 DIAGNOSIS — F0390 Unspecified dementia without behavioral disturbance: Secondary | ICD-10-CM | POA: Diagnosis not present

## 2018-08-26 DIAGNOSIS — R488 Other symbolic dysfunctions: Secondary | ICD-10-CM | POA: Diagnosis not present

## 2018-08-27 DIAGNOSIS — F0390 Unspecified dementia without behavioral disturbance: Secondary | ICD-10-CM | POA: Diagnosis not present

## 2018-08-27 DIAGNOSIS — J449 Chronic obstructive pulmonary disease, unspecified: Secondary | ICD-10-CM | POA: Diagnosis not present

## 2018-08-27 DIAGNOSIS — R488 Other symbolic dysfunctions: Secondary | ICD-10-CM | POA: Diagnosis not present

## 2018-08-27 DIAGNOSIS — F0280 Dementia in other diseases classified elsewhere without behavioral disturbance: Secondary | ICD-10-CM | POA: Diagnosis not present

## 2018-08-27 DIAGNOSIS — M6281 Muscle weakness (generalized): Secondary | ICD-10-CM | POA: Diagnosis not present

## 2018-08-27 DIAGNOSIS — I5031 Acute diastolic (congestive) heart failure: Secondary | ICD-10-CM | POA: Diagnosis not present

## 2018-08-30 DIAGNOSIS — R488 Other symbolic dysfunctions: Secondary | ICD-10-CM | POA: Diagnosis not present

## 2018-08-30 DIAGNOSIS — F0280 Dementia in other diseases classified elsewhere without behavioral disturbance: Secondary | ICD-10-CM | POA: Diagnosis not present

## 2018-08-30 DIAGNOSIS — F0390 Unspecified dementia without behavioral disturbance: Secondary | ICD-10-CM | POA: Diagnosis not present

## 2018-08-30 DIAGNOSIS — M6281 Muscle weakness (generalized): Secondary | ICD-10-CM | POA: Diagnosis not present

## 2018-08-30 DIAGNOSIS — I5031 Acute diastolic (congestive) heart failure: Secondary | ICD-10-CM | POA: Diagnosis not present

## 2018-08-30 DIAGNOSIS — J449 Chronic obstructive pulmonary disease, unspecified: Secondary | ICD-10-CM | POA: Diagnosis not present

## 2018-08-31 DIAGNOSIS — I5031 Acute diastolic (congestive) heart failure: Secondary | ICD-10-CM | POA: Diagnosis not present

## 2018-08-31 DIAGNOSIS — R488 Other symbolic dysfunctions: Secondary | ICD-10-CM | POA: Diagnosis not present

## 2018-08-31 DIAGNOSIS — J449 Chronic obstructive pulmonary disease, unspecified: Secondary | ICD-10-CM | POA: Diagnosis not present

## 2018-08-31 DIAGNOSIS — F0390 Unspecified dementia without behavioral disturbance: Secondary | ICD-10-CM | POA: Diagnosis not present

## 2018-08-31 DIAGNOSIS — F0280 Dementia in other diseases classified elsewhere without behavioral disturbance: Secondary | ICD-10-CM | POA: Diagnosis not present

## 2018-08-31 DIAGNOSIS — M6281 Muscle weakness (generalized): Secondary | ICD-10-CM | POA: Diagnosis not present

## 2018-09-01 DIAGNOSIS — I5031 Acute diastolic (congestive) heart failure: Secondary | ICD-10-CM | POA: Diagnosis not present

## 2018-09-01 DIAGNOSIS — M6281 Muscle weakness (generalized): Secondary | ICD-10-CM | POA: Diagnosis not present

## 2018-09-01 DIAGNOSIS — R488 Other symbolic dysfunctions: Secondary | ICD-10-CM | POA: Diagnosis not present

## 2018-09-01 DIAGNOSIS — F0390 Unspecified dementia without behavioral disturbance: Secondary | ICD-10-CM | POA: Diagnosis not present

## 2018-09-01 DIAGNOSIS — F0280 Dementia in other diseases classified elsewhere without behavioral disturbance: Secondary | ICD-10-CM | POA: Diagnosis not present

## 2018-09-01 DIAGNOSIS — J449 Chronic obstructive pulmonary disease, unspecified: Secondary | ICD-10-CM | POA: Diagnosis not present

## 2018-09-02 DIAGNOSIS — F0390 Unspecified dementia without behavioral disturbance: Secondary | ICD-10-CM | POA: Diagnosis not present

## 2018-09-02 DIAGNOSIS — M6281 Muscle weakness (generalized): Secondary | ICD-10-CM | POA: Diagnosis not present

## 2018-09-02 DIAGNOSIS — I5031 Acute diastolic (congestive) heart failure: Secondary | ICD-10-CM | POA: Diagnosis not present

## 2018-09-02 DIAGNOSIS — F0280 Dementia in other diseases classified elsewhere without behavioral disturbance: Secondary | ICD-10-CM | POA: Diagnosis not present

## 2018-09-02 DIAGNOSIS — J449 Chronic obstructive pulmonary disease, unspecified: Secondary | ICD-10-CM | POA: Diagnosis not present

## 2018-09-02 DIAGNOSIS — R488 Other symbolic dysfunctions: Secondary | ICD-10-CM | POA: Diagnosis not present

## 2018-09-03 DIAGNOSIS — J449 Chronic obstructive pulmonary disease, unspecified: Secondary | ICD-10-CM | POA: Diagnosis not present

## 2018-09-03 DIAGNOSIS — F0280 Dementia in other diseases classified elsewhere without behavioral disturbance: Secondary | ICD-10-CM | POA: Diagnosis not present

## 2018-09-03 DIAGNOSIS — F0390 Unspecified dementia without behavioral disturbance: Secondary | ICD-10-CM | POA: Diagnosis not present

## 2018-09-03 DIAGNOSIS — I5031 Acute diastolic (congestive) heart failure: Secondary | ICD-10-CM | POA: Diagnosis not present

## 2018-09-03 DIAGNOSIS — M6281 Muscle weakness (generalized): Secondary | ICD-10-CM | POA: Diagnosis not present

## 2018-09-03 DIAGNOSIS — R488 Other symbolic dysfunctions: Secondary | ICD-10-CM | POA: Diagnosis not present

## 2018-09-06 DIAGNOSIS — R488 Other symbolic dysfunctions: Secondary | ICD-10-CM | POA: Diagnosis not present

## 2018-09-06 DIAGNOSIS — F0390 Unspecified dementia without behavioral disturbance: Secondary | ICD-10-CM | POA: Diagnosis not present

## 2018-09-06 DIAGNOSIS — J449 Chronic obstructive pulmonary disease, unspecified: Secondary | ICD-10-CM | POA: Diagnosis not present

## 2018-09-06 DIAGNOSIS — M6281 Muscle weakness (generalized): Secondary | ICD-10-CM | POA: Diagnosis not present

## 2018-09-06 DIAGNOSIS — I5031 Acute diastolic (congestive) heart failure: Secondary | ICD-10-CM | POA: Diagnosis not present

## 2018-09-06 DIAGNOSIS — F0280 Dementia in other diseases classified elsewhere without behavioral disturbance: Secondary | ICD-10-CM | POA: Diagnosis not present

## 2018-09-07 DIAGNOSIS — J449 Chronic obstructive pulmonary disease, unspecified: Secondary | ICD-10-CM | POA: Diagnosis not present

## 2018-09-07 DIAGNOSIS — R488 Other symbolic dysfunctions: Secondary | ICD-10-CM | POA: Diagnosis not present

## 2018-09-07 DIAGNOSIS — M6281 Muscle weakness (generalized): Secondary | ICD-10-CM | POA: Diagnosis not present

## 2018-09-07 DIAGNOSIS — F0280 Dementia in other diseases classified elsewhere without behavioral disturbance: Secondary | ICD-10-CM | POA: Diagnosis not present

## 2018-09-07 DIAGNOSIS — I5031 Acute diastolic (congestive) heart failure: Secondary | ICD-10-CM | POA: Diagnosis not present

## 2018-09-07 DIAGNOSIS — F0390 Unspecified dementia without behavioral disturbance: Secondary | ICD-10-CM | POA: Diagnosis not present

## 2018-09-30 DIAGNOSIS — I5032 Chronic diastolic (congestive) heart failure: Secondary | ICD-10-CM | POA: Diagnosis not present

## 2018-09-30 DIAGNOSIS — I10 Essential (primary) hypertension: Secondary | ICD-10-CM | POA: Diagnosis not present

## 2018-09-30 DIAGNOSIS — I2510 Atherosclerotic heart disease of native coronary artery without angina pectoris: Secondary | ICD-10-CM | POA: Diagnosis not present

## 2018-09-30 DIAGNOSIS — I4811 Longstanding persistent atrial fibrillation: Secondary | ICD-10-CM | POA: Diagnosis not present

## 2018-10-04 DIAGNOSIS — I2510 Atherosclerotic heart disease of native coronary artery without angina pectoris: Secondary | ICD-10-CM | POA: Diagnosis not present

## 2018-10-04 DIAGNOSIS — I5032 Chronic diastolic (congestive) heart failure: Secondary | ICD-10-CM | POA: Diagnosis not present

## 2018-10-04 DIAGNOSIS — E782 Mixed hyperlipidemia: Secondary | ICD-10-CM | POA: Diagnosis not present

## 2018-10-04 DIAGNOSIS — I10 Essential (primary) hypertension: Secondary | ICD-10-CM | POA: Diagnosis not present

## 2018-10-05 DIAGNOSIS — F5109 Other insomnia not due to a substance or known physiological condition: Secondary | ICD-10-CM | POA: Diagnosis not present

## 2018-10-05 DIAGNOSIS — F0150 Vascular dementia without behavioral disturbance: Secondary | ICD-10-CM | POA: Diagnosis not present

## 2018-10-05 DIAGNOSIS — F320 Major depressive disorder, single episode, mild: Secondary | ICD-10-CM | POA: Diagnosis not present

## 2018-10-06 DIAGNOSIS — D649 Anemia, unspecified: Secondary | ICD-10-CM | POA: Diagnosis not present

## 2018-10-06 DIAGNOSIS — E559 Vitamin D deficiency, unspecified: Secondary | ICD-10-CM | POA: Diagnosis not present

## 2018-10-06 DIAGNOSIS — E039 Hypothyroidism, unspecified: Secondary | ICD-10-CM | POA: Diagnosis not present

## 2018-10-06 DIAGNOSIS — E119 Type 2 diabetes mellitus without complications: Secondary | ICD-10-CM | POA: Diagnosis not present

## 2018-10-06 DIAGNOSIS — E785 Hyperlipidemia, unspecified: Secondary | ICD-10-CM | POA: Diagnosis not present

## 2018-10-28 DIAGNOSIS — F0150 Vascular dementia without behavioral disturbance: Secondary | ICD-10-CM | POA: Diagnosis not present

## 2018-10-28 DIAGNOSIS — I2510 Atherosclerotic heart disease of native coronary artery without angina pectoris: Secondary | ICD-10-CM | POA: Diagnosis not present

## 2018-10-28 DIAGNOSIS — I10 Essential (primary) hypertension: Secondary | ICD-10-CM | POA: Diagnosis not present

## 2018-10-28 DIAGNOSIS — I5032 Chronic diastolic (congestive) heart failure: Secondary | ICD-10-CM | POA: Diagnosis not present

## 2018-10-28 DIAGNOSIS — F5109 Other insomnia not due to a substance or known physiological condition: Secondary | ICD-10-CM | POA: Diagnosis not present

## 2018-10-28 DIAGNOSIS — F320 Major depressive disorder, single episode, mild: Secondary | ICD-10-CM | POA: Diagnosis not present

## 2018-11-01 DIAGNOSIS — F0150 Vascular dementia without behavioral disturbance: Secondary | ICD-10-CM | POA: Diagnosis not present

## 2018-11-01 DIAGNOSIS — I10 Essential (primary) hypertension: Secondary | ICD-10-CM | POA: Diagnosis not present

## 2018-11-01 DIAGNOSIS — I5032 Chronic diastolic (congestive) heart failure: Secondary | ICD-10-CM | POA: Diagnosis not present

## 2018-11-01 DIAGNOSIS — I2510 Atherosclerotic heart disease of native coronary artery without angina pectoris: Secondary | ICD-10-CM | POA: Diagnosis not present

## 2018-11-10 DIAGNOSIS — F339 Major depressive disorder, recurrent, unspecified: Secondary | ICD-10-CM | POA: Diagnosis not present

## 2018-11-11 DIAGNOSIS — F5109 Other insomnia not due to a substance or known physiological condition: Secondary | ICD-10-CM | POA: Diagnosis not present

## 2018-11-11 DIAGNOSIS — F320 Major depressive disorder, single episode, mild: Secondary | ICD-10-CM | POA: Diagnosis not present

## 2018-11-11 DIAGNOSIS — F0150 Vascular dementia without behavioral disturbance: Secondary | ICD-10-CM | POA: Diagnosis not present

## 2018-11-16 DIAGNOSIS — L309 Dermatitis, unspecified: Secondary | ICD-10-CM | POA: Diagnosis not present

## 2018-11-16 DIAGNOSIS — S01301A Unspecified open wound of right ear, initial encounter: Secondary | ICD-10-CM | POA: Diagnosis not present

## 2018-11-16 DIAGNOSIS — Z85828 Personal history of other malignant neoplasm of skin: Secondary | ICD-10-CM | POA: Diagnosis not present

## 2018-11-16 DIAGNOSIS — C44212 Basal cell carcinoma of skin of right ear and external auricular canal: Secondary | ICD-10-CM | POA: Diagnosis not present

## 2018-11-18 DIAGNOSIS — F339 Major depressive disorder, recurrent, unspecified: Secondary | ICD-10-CM | POA: Diagnosis not present

## 2018-11-25 DIAGNOSIS — I5032 Chronic diastolic (congestive) heart failure: Secondary | ICD-10-CM | POA: Diagnosis not present

## 2018-11-25 DIAGNOSIS — I10 Essential (primary) hypertension: Secondary | ICD-10-CM | POA: Diagnosis not present

## 2018-11-25 DIAGNOSIS — I2510 Atherosclerotic heart disease of native coronary artery without angina pectoris: Secondary | ICD-10-CM | POA: Diagnosis not present

## 2018-11-25 DIAGNOSIS — F339 Major depressive disorder, recurrent, unspecified: Secondary | ICD-10-CM | POA: Diagnosis not present

## 2018-11-25 DIAGNOSIS — H6010 Cellulitis of external ear, unspecified ear: Secondary | ICD-10-CM | POA: Diagnosis not present

## 2018-11-29 DIAGNOSIS — H6191 Disorder of right external ear, unspecified: Secondary | ICD-10-CM | POA: Diagnosis not present

## 2018-11-29 DIAGNOSIS — I5032 Chronic diastolic (congestive) heart failure: Secondary | ICD-10-CM | POA: Diagnosis not present

## 2018-11-29 DIAGNOSIS — E113293 Type 2 diabetes mellitus with mild nonproliferative diabetic retinopathy without macular edema, bilateral: Secondary | ICD-10-CM | POA: Diagnosis not present

## 2018-11-29 DIAGNOSIS — I2510 Atherosclerotic heart disease of native coronary artery without angina pectoris: Secondary | ICD-10-CM | POA: Diagnosis not present

## 2018-11-29 DIAGNOSIS — Z794 Long term (current) use of insulin: Secondary | ICD-10-CM | POA: Diagnosis not present

## 2018-11-29 DIAGNOSIS — H04123 Dry eye syndrome of bilateral lacrimal glands: Secondary | ICD-10-CM | POA: Diagnosis not present

## 2018-11-29 DIAGNOSIS — F0150 Vascular dementia without behavioral disturbance: Secondary | ICD-10-CM | POA: Diagnosis not present

## 2018-11-29 DIAGNOSIS — Z7984 Long term (current) use of oral hypoglycemic drugs: Secondary | ICD-10-CM | POA: Diagnosis not present

## 2018-12-01 DIAGNOSIS — F339 Major depressive disorder, recurrent, unspecified: Secondary | ICD-10-CM | POA: Diagnosis not present

## 2018-12-07 DIAGNOSIS — F339 Major depressive disorder, recurrent, unspecified: Secondary | ICD-10-CM | POA: Diagnosis not present

## 2018-12-07 DIAGNOSIS — S01301A Unspecified open wound of right ear, initial encounter: Secondary | ICD-10-CM | POA: Diagnosis not present

## 2018-12-15 DIAGNOSIS — F0280 Dementia in other diseases classified elsewhere without behavioral disturbance: Secondary | ICD-10-CM | POA: Diagnosis not present

## 2018-12-15 DIAGNOSIS — F0390 Unspecified dementia without behavioral disturbance: Secondary | ICD-10-CM | POA: Diagnosis not present

## 2018-12-15 DIAGNOSIS — R1311 Dysphagia, oral phase: Secondary | ICD-10-CM | POA: Diagnosis not present

## 2018-12-15 DIAGNOSIS — I5031 Acute diastolic (congestive) heart failure: Secondary | ICD-10-CM | POA: Diagnosis not present

## 2018-12-16 DIAGNOSIS — I5031 Acute diastolic (congestive) heart failure: Secondary | ICD-10-CM | POA: Diagnosis not present

## 2018-12-16 DIAGNOSIS — R1311 Dysphagia, oral phase: Secondary | ICD-10-CM | POA: Diagnosis not present

## 2018-12-16 DIAGNOSIS — F0390 Unspecified dementia without behavioral disturbance: Secondary | ICD-10-CM | POA: Diagnosis not present

## 2018-12-16 DIAGNOSIS — F0280 Dementia in other diseases classified elsewhere without behavioral disturbance: Secondary | ICD-10-CM | POA: Diagnosis not present

## 2018-12-17 DIAGNOSIS — R1311 Dysphagia, oral phase: Secondary | ICD-10-CM | POA: Diagnosis not present

## 2018-12-17 DIAGNOSIS — F0280 Dementia in other diseases classified elsewhere without behavioral disturbance: Secondary | ICD-10-CM | POA: Diagnosis not present

## 2018-12-17 DIAGNOSIS — F0390 Unspecified dementia without behavioral disturbance: Secondary | ICD-10-CM | POA: Diagnosis not present

## 2018-12-17 DIAGNOSIS — I5031 Acute diastolic (congestive) heart failure: Secondary | ICD-10-CM | POA: Diagnosis not present

## 2018-12-18 DIAGNOSIS — F339 Major depressive disorder, recurrent, unspecified: Secondary | ICD-10-CM | POA: Diagnosis not present

## 2018-12-20 DIAGNOSIS — R1311 Dysphagia, oral phase: Secondary | ICD-10-CM | POA: Diagnosis not present

## 2018-12-20 DIAGNOSIS — F0280 Dementia in other diseases classified elsewhere without behavioral disturbance: Secondary | ICD-10-CM | POA: Diagnosis not present

## 2018-12-20 DIAGNOSIS — F0390 Unspecified dementia without behavioral disturbance: Secondary | ICD-10-CM | POA: Diagnosis not present

## 2018-12-20 DIAGNOSIS — I5031 Acute diastolic (congestive) heart failure: Secondary | ICD-10-CM | POA: Diagnosis not present

## 2018-12-21 DIAGNOSIS — F0390 Unspecified dementia without behavioral disturbance: Secondary | ICD-10-CM | POA: Diagnosis not present

## 2018-12-21 DIAGNOSIS — I5031 Acute diastolic (congestive) heart failure: Secondary | ICD-10-CM | POA: Diagnosis not present

## 2018-12-21 DIAGNOSIS — R1311 Dysphagia, oral phase: Secondary | ICD-10-CM | POA: Diagnosis not present

## 2018-12-21 DIAGNOSIS — F0280 Dementia in other diseases classified elsewhere without behavioral disturbance: Secondary | ICD-10-CM | POA: Diagnosis not present

## 2018-12-22 DIAGNOSIS — F0280 Dementia in other diseases classified elsewhere without behavioral disturbance: Secondary | ICD-10-CM | POA: Diagnosis not present

## 2018-12-22 DIAGNOSIS — I5031 Acute diastolic (congestive) heart failure: Secondary | ICD-10-CM | POA: Diagnosis not present

## 2018-12-22 DIAGNOSIS — R1311 Dysphagia, oral phase: Secondary | ICD-10-CM | POA: Diagnosis not present

## 2018-12-22 DIAGNOSIS — F0390 Unspecified dementia without behavioral disturbance: Secondary | ICD-10-CM | POA: Diagnosis not present

## 2018-12-23 DIAGNOSIS — F0280 Dementia in other diseases classified elsewhere without behavioral disturbance: Secondary | ICD-10-CM | POA: Diagnosis not present

## 2018-12-23 DIAGNOSIS — I10 Essential (primary) hypertension: Secondary | ICD-10-CM | POA: Diagnosis not present

## 2018-12-23 DIAGNOSIS — H6191 Disorder of right external ear, unspecified: Secondary | ICD-10-CM | POA: Diagnosis not present

## 2018-12-23 DIAGNOSIS — F0390 Unspecified dementia without behavioral disturbance: Secondary | ICD-10-CM | POA: Diagnosis not present

## 2018-12-23 DIAGNOSIS — R1311 Dysphagia, oral phase: Secondary | ICD-10-CM | POA: Diagnosis not present

## 2018-12-23 DIAGNOSIS — I5032 Chronic diastolic (congestive) heart failure: Secondary | ICD-10-CM | POA: Diagnosis not present

## 2018-12-23 DIAGNOSIS — I5031 Acute diastolic (congestive) heart failure: Secondary | ICD-10-CM | POA: Diagnosis not present

## 2018-12-23 DIAGNOSIS — J449 Chronic obstructive pulmonary disease, unspecified: Secondary | ICD-10-CM | POA: Diagnosis not present

## 2018-12-24 DIAGNOSIS — F339 Major depressive disorder, recurrent, unspecified: Secondary | ICD-10-CM | POA: Diagnosis not present

## 2018-12-24 DIAGNOSIS — I5031 Acute diastolic (congestive) heart failure: Secondary | ICD-10-CM | POA: Diagnosis not present

## 2018-12-24 DIAGNOSIS — F0390 Unspecified dementia without behavioral disturbance: Secondary | ICD-10-CM | POA: Diagnosis not present

## 2018-12-24 DIAGNOSIS — R1311 Dysphagia, oral phase: Secondary | ICD-10-CM | POA: Diagnosis not present

## 2018-12-24 DIAGNOSIS — F0280 Dementia in other diseases classified elsewhere without behavioral disturbance: Secondary | ICD-10-CM | POA: Diagnosis not present

## 2018-12-27 DIAGNOSIS — R1311 Dysphagia, oral phase: Secondary | ICD-10-CM | POA: Diagnosis not present

## 2018-12-27 DIAGNOSIS — I5031 Acute diastolic (congestive) heart failure: Secondary | ICD-10-CM | POA: Diagnosis not present

## 2018-12-27 DIAGNOSIS — F0390 Unspecified dementia without behavioral disturbance: Secondary | ICD-10-CM | POA: Diagnosis not present

## 2018-12-27 DIAGNOSIS — F0280 Dementia in other diseases classified elsewhere without behavioral disturbance: Secondary | ICD-10-CM | POA: Diagnosis not present

## 2018-12-28 DIAGNOSIS — F0280 Dementia in other diseases classified elsewhere without behavioral disturbance: Secondary | ICD-10-CM | POA: Diagnosis not present

## 2018-12-28 DIAGNOSIS — F339 Major depressive disorder, recurrent, unspecified: Secondary | ICD-10-CM | POA: Diagnosis not present

## 2018-12-28 DIAGNOSIS — I5031 Acute diastolic (congestive) heart failure: Secondary | ICD-10-CM | POA: Diagnosis not present

## 2018-12-28 DIAGNOSIS — R1311 Dysphagia, oral phase: Secondary | ICD-10-CM | POA: Diagnosis not present

## 2018-12-28 DIAGNOSIS — F0390 Unspecified dementia without behavioral disturbance: Secondary | ICD-10-CM | POA: Diagnosis not present

## 2018-12-29 DIAGNOSIS — F0390 Unspecified dementia without behavioral disturbance: Secondary | ICD-10-CM | POA: Diagnosis not present

## 2018-12-29 DIAGNOSIS — F0280 Dementia in other diseases classified elsewhere without behavioral disturbance: Secondary | ICD-10-CM | POA: Diagnosis not present

## 2018-12-29 DIAGNOSIS — I5031 Acute diastolic (congestive) heart failure: Secondary | ICD-10-CM | POA: Diagnosis not present

## 2018-12-29 DIAGNOSIS — R1311 Dysphagia, oral phase: Secondary | ICD-10-CM | POA: Diagnosis not present

## 2018-12-30 DIAGNOSIS — F0390 Unspecified dementia without behavioral disturbance: Secondary | ICD-10-CM | POA: Diagnosis not present

## 2018-12-30 DIAGNOSIS — F0150 Vascular dementia without behavioral disturbance: Secondary | ICD-10-CM | POA: Diagnosis not present

## 2018-12-30 DIAGNOSIS — R1311 Dysphagia, oral phase: Secondary | ICD-10-CM | POA: Diagnosis not present

## 2018-12-30 DIAGNOSIS — I5031 Acute diastolic (congestive) heart failure: Secondary | ICD-10-CM | POA: Diagnosis not present

## 2018-12-30 DIAGNOSIS — F320 Major depressive disorder, single episode, mild: Secondary | ICD-10-CM | POA: Diagnosis not present

## 2018-12-30 DIAGNOSIS — F5109 Other insomnia not due to a substance or known physiological condition: Secondary | ICD-10-CM | POA: Diagnosis not present

## 2018-12-30 DIAGNOSIS — F0280 Dementia in other diseases classified elsewhere without behavioral disturbance: Secondary | ICD-10-CM | POA: Diagnosis not present

## 2018-12-31 DIAGNOSIS — R1311 Dysphagia, oral phase: Secondary | ICD-10-CM | POA: Diagnosis not present

## 2018-12-31 DIAGNOSIS — F0390 Unspecified dementia without behavioral disturbance: Secondary | ICD-10-CM | POA: Diagnosis not present

## 2018-12-31 DIAGNOSIS — I5031 Acute diastolic (congestive) heart failure: Secondary | ICD-10-CM | POA: Diagnosis not present

## 2018-12-31 DIAGNOSIS — F0280 Dementia in other diseases classified elsewhere without behavioral disturbance: Secondary | ICD-10-CM | POA: Diagnosis not present

## 2019-01-02 DIAGNOSIS — F0390 Unspecified dementia without behavioral disturbance: Secondary | ICD-10-CM | POA: Diagnosis not present

## 2019-01-02 DIAGNOSIS — R1311 Dysphagia, oral phase: Secondary | ICD-10-CM | POA: Diagnosis not present

## 2019-01-02 DIAGNOSIS — F0280 Dementia in other diseases classified elsewhere without behavioral disturbance: Secondary | ICD-10-CM | POA: Diagnosis not present

## 2019-01-02 DIAGNOSIS — I5031 Acute diastolic (congestive) heart failure: Secondary | ICD-10-CM | POA: Diagnosis not present

## 2019-01-04 DIAGNOSIS — R1311 Dysphagia, oral phase: Secondary | ICD-10-CM | POA: Diagnosis not present

## 2019-01-04 DIAGNOSIS — F0390 Unspecified dementia without behavioral disturbance: Secondary | ICD-10-CM | POA: Diagnosis not present

## 2019-01-04 DIAGNOSIS — F0280 Dementia in other diseases classified elsewhere without behavioral disturbance: Secondary | ICD-10-CM | POA: Diagnosis not present

## 2019-01-04 DIAGNOSIS — I5031 Acute diastolic (congestive) heart failure: Secondary | ICD-10-CM | POA: Diagnosis not present

## 2019-01-05 DIAGNOSIS — F0280 Dementia in other diseases classified elsewhere without behavioral disturbance: Secondary | ICD-10-CM | POA: Diagnosis not present

## 2019-01-05 DIAGNOSIS — R1311 Dysphagia, oral phase: Secondary | ICD-10-CM | POA: Diagnosis not present

## 2019-01-05 DIAGNOSIS — F0390 Unspecified dementia without behavioral disturbance: Secondary | ICD-10-CM | POA: Diagnosis not present

## 2019-01-05 DIAGNOSIS — I5031 Acute diastolic (congestive) heart failure: Secondary | ICD-10-CM | POA: Diagnosis not present

## 2019-01-06 DIAGNOSIS — I5031 Acute diastolic (congestive) heart failure: Secondary | ICD-10-CM | POA: Diagnosis not present

## 2019-01-06 DIAGNOSIS — F0280 Dementia in other diseases classified elsewhere without behavioral disturbance: Secondary | ICD-10-CM | POA: Diagnosis not present

## 2019-01-06 DIAGNOSIS — R1311 Dysphagia, oral phase: Secondary | ICD-10-CM | POA: Diagnosis not present

## 2019-01-06 DIAGNOSIS — F0390 Unspecified dementia without behavioral disturbance: Secondary | ICD-10-CM | POA: Diagnosis not present

## 2019-01-07 DIAGNOSIS — I5031 Acute diastolic (congestive) heart failure: Secondary | ICD-10-CM | POA: Diagnosis not present

## 2019-01-07 DIAGNOSIS — F339 Major depressive disorder, recurrent, unspecified: Secondary | ICD-10-CM | POA: Diagnosis not present

## 2019-01-07 DIAGNOSIS — F0280 Dementia in other diseases classified elsewhere without behavioral disturbance: Secondary | ICD-10-CM | POA: Diagnosis not present

## 2019-01-07 DIAGNOSIS — R1311 Dysphagia, oral phase: Secondary | ICD-10-CM | POA: Diagnosis not present

## 2019-01-07 DIAGNOSIS — F0390 Unspecified dementia without behavioral disturbance: Secondary | ICD-10-CM | POA: Diagnosis not present

## 2019-01-09 DIAGNOSIS — F0280 Dementia in other diseases classified elsewhere without behavioral disturbance: Secondary | ICD-10-CM | POA: Diagnosis not present

## 2019-01-09 DIAGNOSIS — I5031 Acute diastolic (congestive) heart failure: Secondary | ICD-10-CM | POA: Diagnosis not present

## 2019-01-09 DIAGNOSIS — R1311 Dysphagia, oral phase: Secondary | ICD-10-CM | POA: Diagnosis not present

## 2019-01-09 DIAGNOSIS — F0390 Unspecified dementia without behavioral disturbance: Secondary | ICD-10-CM | POA: Diagnosis not present

## 2019-01-10 DIAGNOSIS — I5031 Acute diastolic (congestive) heart failure: Secondary | ICD-10-CM | POA: Diagnosis not present

## 2019-01-10 DIAGNOSIS — R1311 Dysphagia, oral phase: Secondary | ICD-10-CM | POA: Diagnosis not present

## 2019-01-10 DIAGNOSIS — F0390 Unspecified dementia without behavioral disturbance: Secondary | ICD-10-CM | POA: Diagnosis not present

## 2019-01-10 DIAGNOSIS — F0280 Dementia in other diseases classified elsewhere without behavioral disturbance: Secondary | ICD-10-CM | POA: Diagnosis not present

## 2019-01-12 DIAGNOSIS — R1311 Dysphagia, oral phase: Secondary | ICD-10-CM | POA: Diagnosis not present

## 2019-01-12 DIAGNOSIS — I5031 Acute diastolic (congestive) heart failure: Secondary | ICD-10-CM | POA: Diagnosis not present

## 2019-01-12 DIAGNOSIS — F0390 Unspecified dementia without behavioral disturbance: Secondary | ICD-10-CM | POA: Diagnosis not present

## 2019-01-12 DIAGNOSIS — F0280 Dementia in other diseases classified elsewhere without behavioral disturbance: Secondary | ICD-10-CM | POA: Diagnosis not present

## 2019-01-13 DIAGNOSIS — R1311 Dysphagia, oral phase: Secondary | ICD-10-CM | POA: Diagnosis not present

## 2019-01-13 DIAGNOSIS — I5031 Acute diastolic (congestive) heart failure: Secondary | ICD-10-CM | POA: Diagnosis not present

## 2019-01-13 DIAGNOSIS — F0280 Dementia in other diseases classified elsewhere without behavioral disturbance: Secondary | ICD-10-CM | POA: Diagnosis not present

## 2019-01-13 DIAGNOSIS — F0390 Unspecified dementia without behavioral disturbance: Secondary | ICD-10-CM | POA: Diagnosis not present

## 2019-01-14 DIAGNOSIS — I2510 Atherosclerotic heart disease of native coronary artery without angina pectoris: Secondary | ICD-10-CM | POA: Diagnosis not present

## 2019-01-14 DIAGNOSIS — I5032 Chronic diastolic (congestive) heart failure: Secondary | ICD-10-CM | POA: Diagnosis not present

## 2019-01-14 DIAGNOSIS — I10 Essential (primary) hypertension: Secondary | ICD-10-CM | POA: Diagnosis not present

## 2019-01-14 DIAGNOSIS — H6191 Disorder of right external ear, unspecified: Secondary | ICD-10-CM | POA: Diagnosis not present

## 2019-01-14 DIAGNOSIS — R1311 Dysphagia, oral phase: Secondary | ICD-10-CM | POA: Diagnosis not present

## 2019-01-14 DIAGNOSIS — F0280 Dementia in other diseases classified elsewhere without behavioral disturbance: Secondary | ICD-10-CM | POA: Diagnosis not present

## 2019-01-14 DIAGNOSIS — F0390 Unspecified dementia without behavioral disturbance: Secondary | ICD-10-CM | POA: Diagnosis not present

## 2019-01-14 DIAGNOSIS — I5031 Acute diastolic (congestive) heart failure: Secondary | ICD-10-CM | POA: Diagnosis not present

## 2019-01-16 DIAGNOSIS — F339 Major depressive disorder, recurrent, unspecified: Secondary | ICD-10-CM | POA: Diagnosis not present

## 2019-01-17 DIAGNOSIS — F0280 Dementia in other diseases classified elsewhere without behavioral disturbance: Secondary | ICD-10-CM | POA: Diagnosis not present

## 2019-01-17 DIAGNOSIS — R1311 Dysphagia, oral phase: Secondary | ICD-10-CM | POA: Diagnosis not present

## 2019-01-17 DIAGNOSIS — F0390 Unspecified dementia without behavioral disturbance: Secondary | ICD-10-CM | POA: Diagnosis not present

## 2019-01-17 DIAGNOSIS — I5031 Acute diastolic (congestive) heart failure: Secondary | ICD-10-CM | POA: Diagnosis not present

## 2019-01-18 DIAGNOSIS — F0390 Unspecified dementia without behavioral disturbance: Secondary | ICD-10-CM | POA: Diagnosis not present

## 2019-01-18 DIAGNOSIS — I5031 Acute diastolic (congestive) heart failure: Secondary | ICD-10-CM | POA: Diagnosis not present

## 2019-01-18 DIAGNOSIS — R1311 Dysphagia, oral phase: Secondary | ICD-10-CM | POA: Diagnosis not present

## 2019-01-18 DIAGNOSIS — F0280 Dementia in other diseases classified elsewhere without behavioral disturbance: Secondary | ICD-10-CM | POA: Diagnosis not present

## 2019-01-20 DIAGNOSIS — E785 Hyperlipidemia, unspecified: Secondary | ICD-10-CM | POA: Diagnosis not present

## 2019-01-20 DIAGNOSIS — D649 Anemia, unspecified: Secondary | ICD-10-CM | POA: Diagnosis not present

## 2019-01-20 DIAGNOSIS — E039 Hypothyroidism, unspecified: Secondary | ICD-10-CM | POA: Diagnosis not present

## 2019-01-20 DIAGNOSIS — E559 Vitamin D deficiency, unspecified: Secondary | ICD-10-CM | POA: Diagnosis not present

## 2019-01-20 DIAGNOSIS — I5032 Chronic diastolic (congestive) heart failure: Secondary | ICD-10-CM | POA: Diagnosis not present

## 2019-01-23 DIAGNOSIS — F339 Major depressive disorder, recurrent, unspecified: Secondary | ICD-10-CM | POA: Diagnosis not present

## 2019-01-24 DIAGNOSIS — R1311 Dysphagia, oral phase: Secondary | ICD-10-CM | POA: Diagnosis not present

## 2019-01-24 DIAGNOSIS — F0280 Dementia in other diseases classified elsewhere without behavioral disturbance: Secondary | ICD-10-CM | POA: Diagnosis not present

## 2019-01-24 DIAGNOSIS — F0390 Unspecified dementia without behavioral disturbance: Secondary | ICD-10-CM | POA: Diagnosis not present

## 2019-01-24 DIAGNOSIS — I5031 Acute diastolic (congestive) heart failure: Secondary | ICD-10-CM | POA: Diagnosis not present

## 2019-01-25 DIAGNOSIS — R1311 Dysphagia, oral phase: Secondary | ICD-10-CM | POA: Diagnosis not present

## 2019-01-25 DIAGNOSIS — I5031 Acute diastolic (congestive) heart failure: Secondary | ICD-10-CM | POA: Diagnosis not present

## 2019-01-25 DIAGNOSIS — F0280 Dementia in other diseases classified elsewhere without behavioral disturbance: Secondary | ICD-10-CM | POA: Diagnosis not present

## 2019-01-25 DIAGNOSIS — F0390 Unspecified dementia without behavioral disturbance: Secondary | ICD-10-CM | POA: Diagnosis not present

## 2019-01-26 DIAGNOSIS — F0280 Dementia in other diseases classified elsewhere without behavioral disturbance: Secondary | ICD-10-CM | POA: Diagnosis not present

## 2019-01-26 DIAGNOSIS — R1311 Dysphagia, oral phase: Secondary | ICD-10-CM | POA: Diagnosis not present

## 2019-01-26 DIAGNOSIS — F0390 Unspecified dementia without behavioral disturbance: Secondary | ICD-10-CM | POA: Diagnosis not present

## 2019-01-26 DIAGNOSIS — I5031 Acute diastolic (congestive) heart failure: Secondary | ICD-10-CM | POA: Diagnosis not present

## 2019-01-27 DIAGNOSIS — F0390 Unspecified dementia without behavioral disturbance: Secondary | ICD-10-CM | POA: Diagnosis not present

## 2019-01-27 DIAGNOSIS — R1311 Dysphagia, oral phase: Secondary | ICD-10-CM | POA: Diagnosis not present

## 2019-01-27 DIAGNOSIS — I5031 Acute diastolic (congestive) heart failure: Secondary | ICD-10-CM | POA: Diagnosis not present

## 2019-01-27 DIAGNOSIS — F0280 Dementia in other diseases classified elsewhere without behavioral disturbance: Secondary | ICD-10-CM | POA: Diagnosis not present

## 2019-02-05 DIAGNOSIS — F339 Major depressive disorder, recurrent, unspecified: Secondary | ICD-10-CM | POA: Diagnosis not present

## 2019-02-10 DIAGNOSIS — F0150 Vascular dementia without behavioral disturbance: Secondary | ICD-10-CM | POA: Diagnosis not present

## 2019-02-10 DIAGNOSIS — F320 Major depressive disorder, single episode, mild: Secondary | ICD-10-CM | POA: Diagnosis not present

## 2019-02-10 DIAGNOSIS — F339 Major depressive disorder, recurrent, unspecified: Secondary | ICD-10-CM | POA: Diagnosis not present

## 2019-02-10 DIAGNOSIS — F5109 Other insomnia not due to a substance or known physiological condition: Secondary | ICD-10-CM | POA: Diagnosis not present

## 2019-02-11 DIAGNOSIS — I5032 Chronic diastolic (congestive) heart failure: Secondary | ICD-10-CM | POA: Diagnosis not present

## 2019-02-11 DIAGNOSIS — I2510 Atherosclerotic heart disease of native coronary artery without angina pectoris: Secondary | ICD-10-CM | POA: Diagnosis not present

## 2019-02-11 DIAGNOSIS — F0150 Vascular dementia without behavioral disturbance: Secondary | ICD-10-CM | POA: Diagnosis not present

## 2019-02-11 DIAGNOSIS — I10 Essential (primary) hypertension: Secondary | ICD-10-CM | POA: Diagnosis not present

## 2019-02-17 DIAGNOSIS — I2510 Atherosclerotic heart disease of native coronary artery without angina pectoris: Secondary | ICD-10-CM | POA: Diagnosis not present

## 2019-02-17 DIAGNOSIS — K219 Gastro-esophageal reflux disease without esophagitis: Secondary | ICD-10-CM | POA: Diagnosis not present

## 2019-02-17 DIAGNOSIS — I5032 Chronic diastolic (congestive) heart failure: Secondary | ICD-10-CM | POA: Diagnosis not present

## 2019-02-17 DIAGNOSIS — I10 Essential (primary) hypertension: Secondary | ICD-10-CM | POA: Diagnosis not present

## 2019-02-19 DIAGNOSIS — F339 Major depressive disorder, recurrent, unspecified: Secondary | ICD-10-CM | POA: Diagnosis not present

## 2019-02-23 DIAGNOSIS — M6281 Muscle weakness (generalized): Secondary | ICD-10-CM | POA: Diagnosis not present

## 2019-02-23 DIAGNOSIS — R2681 Unsteadiness on feet: Secondary | ICD-10-CM | POA: Diagnosis not present

## 2019-02-23 DIAGNOSIS — I5032 Chronic diastolic (congestive) heart failure: Secondary | ICD-10-CM | POA: Diagnosis not present

## 2019-02-24 DIAGNOSIS — M6281 Muscle weakness (generalized): Secondary | ICD-10-CM | POA: Diagnosis not present

## 2019-02-24 DIAGNOSIS — R2681 Unsteadiness on feet: Secondary | ICD-10-CM | POA: Diagnosis not present

## 2019-02-24 DIAGNOSIS — I5032 Chronic diastolic (congestive) heart failure: Secondary | ICD-10-CM | POA: Diagnosis not present

## 2019-02-24 DIAGNOSIS — F339 Major depressive disorder, recurrent, unspecified: Secondary | ICD-10-CM | POA: Diagnosis not present

## 2019-02-25 DIAGNOSIS — R2681 Unsteadiness on feet: Secondary | ICD-10-CM | POA: Diagnosis not present

## 2019-02-25 DIAGNOSIS — I5032 Chronic diastolic (congestive) heart failure: Secondary | ICD-10-CM | POA: Diagnosis not present

## 2019-02-25 DIAGNOSIS — M6281 Muscle weakness (generalized): Secondary | ICD-10-CM | POA: Diagnosis not present

## 2019-02-28 DIAGNOSIS — M6281 Muscle weakness (generalized): Secondary | ICD-10-CM | POA: Diagnosis not present

## 2019-02-28 DIAGNOSIS — R2681 Unsteadiness on feet: Secondary | ICD-10-CM | POA: Diagnosis not present

## 2019-02-28 DIAGNOSIS — I5032 Chronic diastolic (congestive) heart failure: Secondary | ICD-10-CM | POA: Diagnosis not present

## 2019-03-01 DIAGNOSIS — M6281 Muscle weakness (generalized): Secondary | ICD-10-CM | POA: Diagnosis not present

## 2019-03-01 DIAGNOSIS — R2681 Unsteadiness on feet: Secondary | ICD-10-CM | POA: Diagnosis not present

## 2019-03-01 DIAGNOSIS — I5032 Chronic diastolic (congestive) heart failure: Secondary | ICD-10-CM | POA: Diagnosis not present

## 2019-03-02 DIAGNOSIS — M6281 Muscle weakness (generalized): Secondary | ICD-10-CM | POA: Diagnosis not present

## 2019-03-02 DIAGNOSIS — I5032 Chronic diastolic (congestive) heart failure: Secondary | ICD-10-CM | POA: Diagnosis not present

## 2019-03-02 DIAGNOSIS — R2681 Unsteadiness on feet: Secondary | ICD-10-CM | POA: Diagnosis not present

## 2019-03-03 DIAGNOSIS — M6281 Muscle weakness (generalized): Secondary | ICD-10-CM | POA: Diagnosis not present

## 2019-03-03 DIAGNOSIS — I5032 Chronic diastolic (congestive) heart failure: Secondary | ICD-10-CM | POA: Diagnosis not present

## 2019-03-03 DIAGNOSIS — R2681 Unsteadiness on feet: Secondary | ICD-10-CM | POA: Diagnosis not present

## 2019-03-04 DIAGNOSIS — M6281 Muscle weakness (generalized): Secondary | ICD-10-CM | POA: Diagnosis not present

## 2019-03-04 DIAGNOSIS — R2681 Unsteadiness on feet: Secondary | ICD-10-CM | POA: Diagnosis not present

## 2019-03-04 DIAGNOSIS — I5032 Chronic diastolic (congestive) heart failure: Secondary | ICD-10-CM | POA: Diagnosis not present

## 2019-03-05 DIAGNOSIS — F339 Major depressive disorder, recurrent, unspecified: Secondary | ICD-10-CM | POA: Diagnosis not present

## 2019-03-07 DIAGNOSIS — M6281 Muscle weakness (generalized): Secondary | ICD-10-CM | POA: Diagnosis not present

## 2019-03-07 DIAGNOSIS — R2681 Unsteadiness on feet: Secondary | ICD-10-CM | POA: Diagnosis not present

## 2019-03-07 DIAGNOSIS — I5032 Chronic diastolic (congestive) heart failure: Secondary | ICD-10-CM | POA: Diagnosis not present

## 2019-03-08 DIAGNOSIS — I5032 Chronic diastolic (congestive) heart failure: Secondary | ICD-10-CM | POA: Diagnosis not present

## 2019-03-08 DIAGNOSIS — M6281 Muscle weakness (generalized): Secondary | ICD-10-CM | POA: Diagnosis not present

## 2019-03-08 DIAGNOSIS — R2681 Unsteadiness on feet: Secondary | ICD-10-CM | POA: Diagnosis not present

## 2019-03-10 DIAGNOSIS — I5032 Chronic diastolic (congestive) heart failure: Secondary | ICD-10-CM | POA: Diagnosis not present

## 2019-03-10 DIAGNOSIS — R2681 Unsteadiness on feet: Secondary | ICD-10-CM | POA: Diagnosis not present

## 2019-03-10 DIAGNOSIS — F5109 Other insomnia not due to a substance or known physiological condition: Secondary | ICD-10-CM | POA: Diagnosis not present

## 2019-03-10 DIAGNOSIS — F320 Major depressive disorder, single episode, mild: Secondary | ICD-10-CM | POA: Diagnosis not present

## 2019-03-10 DIAGNOSIS — M6281 Muscle weakness (generalized): Secondary | ICD-10-CM | POA: Diagnosis not present

## 2019-03-10 DIAGNOSIS — F0150 Vascular dementia without behavioral disturbance: Secondary | ICD-10-CM | POA: Diagnosis not present

## 2019-03-11 DIAGNOSIS — R2681 Unsteadiness on feet: Secondary | ICD-10-CM | POA: Diagnosis not present

## 2019-03-11 DIAGNOSIS — M6281 Muscle weakness (generalized): Secondary | ICD-10-CM | POA: Diagnosis not present

## 2019-03-11 DIAGNOSIS — I5032 Chronic diastolic (congestive) heart failure: Secondary | ICD-10-CM | POA: Diagnosis not present

## 2019-03-12 DIAGNOSIS — F339 Major depressive disorder, recurrent, unspecified: Secondary | ICD-10-CM | POA: Diagnosis not present

## 2019-03-14 DIAGNOSIS — M6281 Muscle weakness (generalized): Secondary | ICD-10-CM | POA: Diagnosis not present

## 2019-03-14 DIAGNOSIS — R2681 Unsteadiness on feet: Secondary | ICD-10-CM | POA: Diagnosis not present

## 2019-03-14 DIAGNOSIS — I5032 Chronic diastolic (congestive) heart failure: Secondary | ICD-10-CM | POA: Diagnosis not present

## 2019-03-15 DIAGNOSIS — R2681 Unsteadiness on feet: Secondary | ICD-10-CM | POA: Diagnosis not present

## 2019-03-15 DIAGNOSIS — M6281 Muscle weakness (generalized): Secondary | ICD-10-CM | POA: Diagnosis not present

## 2019-03-15 DIAGNOSIS — I5032 Chronic diastolic (congestive) heart failure: Secondary | ICD-10-CM | POA: Diagnosis not present

## 2019-03-16 DIAGNOSIS — I4811 Longstanding persistent atrial fibrillation: Secondary | ICD-10-CM | POA: Diagnosis not present

## 2019-03-16 DIAGNOSIS — K219 Gastro-esophageal reflux disease without esophagitis: Secondary | ICD-10-CM | POA: Diagnosis not present

## 2019-03-16 DIAGNOSIS — I2510 Atherosclerotic heart disease of native coronary artery without angina pectoris: Secondary | ICD-10-CM | POA: Diagnosis not present

## 2019-03-16 DIAGNOSIS — M6281 Muscle weakness (generalized): Secondary | ICD-10-CM | POA: Diagnosis not present

## 2019-03-16 DIAGNOSIS — I5032 Chronic diastolic (congestive) heart failure: Secondary | ICD-10-CM | POA: Diagnosis not present

## 2019-03-16 DIAGNOSIS — R2681 Unsteadiness on feet: Secondary | ICD-10-CM | POA: Diagnosis not present

## 2019-03-16 DIAGNOSIS — I10 Essential (primary) hypertension: Secondary | ICD-10-CM | POA: Diagnosis not present

## 2019-03-17 DIAGNOSIS — M6281 Muscle weakness (generalized): Secondary | ICD-10-CM | POA: Diagnosis not present

## 2019-03-17 DIAGNOSIS — I5032 Chronic diastolic (congestive) heart failure: Secondary | ICD-10-CM | POA: Diagnosis not present

## 2019-03-17 DIAGNOSIS — R2681 Unsteadiness on feet: Secondary | ICD-10-CM | POA: Diagnosis not present

## 2019-03-19 DIAGNOSIS — I5032 Chronic diastolic (congestive) heart failure: Secondary | ICD-10-CM | POA: Diagnosis not present

## 2019-03-19 DIAGNOSIS — M6281 Muscle weakness (generalized): Secondary | ICD-10-CM | POA: Diagnosis not present

## 2019-03-19 DIAGNOSIS — F339 Major depressive disorder, recurrent, unspecified: Secondary | ICD-10-CM | POA: Diagnosis not present

## 2019-03-19 DIAGNOSIS — R2681 Unsteadiness on feet: Secondary | ICD-10-CM | POA: Diagnosis not present

## 2019-03-21 DIAGNOSIS — M6281 Muscle weakness (generalized): Secondary | ICD-10-CM | POA: Diagnosis not present

## 2019-03-21 DIAGNOSIS — I5032 Chronic diastolic (congestive) heart failure: Secondary | ICD-10-CM | POA: Diagnosis not present

## 2019-03-21 DIAGNOSIS — R2681 Unsteadiness on feet: Secondary | ICD-10-CM | POA: Diagnosis not present

## 2019-03-22 DIAGNOSIS — M6281 Muscle weakness (generalized): Secondary | ICD-10-CM | POA: Diagnosis not present

## 2019-03-22 DIAGNOSIS — I5032 Chronic diastolic (congestive) heart failure: Secondary | ICD-10-CM | POA: Diagnosis not present

## 2019-03-22 DIAGNOSIS — R2681 Unsteadiness on feet: Secondary | ICD-10-CM | POA: Diagnosis not present

## 2019-03-23 DIAGNOSIS — I5032 Chronic diastolic (congestive) heart failure: Secondary | ICD-10-CM | POA: Diagnosis not present

## 2019-03-23 DIAGNOSIS — R2681 Unsteadiness on feet: Secondary | ICD-10-CM | POA: Diagnosis not present

## 2019-03-23 DIAGNOSIS — M6281 Muscle weakness (generalized): Secondary | ICD-10-CM | POA: Diagnosis not present

## 2019-03-24 DIAGNOSIS — I5032 Chronic diastolic (congestive) heart failure: Secondary | ICD-10-CM | POA: Diagnosis not present

## 2019-03-24 DIAGNOSIS — M6281 Muscle weakness (generalized): Secondary | ICD-10-CM | POA: Diagnosis not present

## 2019-03-24 DIAGNOSIS — I4811 Longstanding persistent atrial fibrillation: Secondary | ICD-10-CM | POA: Diagnosis not present

## 2019-03-24 DIAGNOSIS — K219 Gastro-esophageal reflux disease without esophagitis: Secondary | ICD-10-CM | POA: Diagnosis not present

## 2019-03-24 DIAGNOSIS — R2681 Unsteadiness on feet: Secondary | ICD-10-CM | POA: Diagnosis not present

## 2019-03-24 DIAGNOSIS — I10 Essential (primary) hypertension: Secondary | ICD-10-CM | POA: Diagnosis not present

## 2019-03-24 DIAGNOSIS — I2510 Atherosclerotic heart disease of native coronary artery without angina pectoris: Secondary | ICD-10-CM | POA: Diagnosis not present

## 2019-03-25 DIAGNOSIS — R2681 Unsteadiness on feet: Secondary | ICD-10-CM | POA: Diagnosis not present

## 2019-03-25 DIAGNOSIS — I5032 Chronic diastolic (congestive) heart failure: Secondary | ICD-10-CM | POA: Diagnosis not present

## 2019-03-25 DIAGNOSIS — M6281 Muscle weakness (generalized): Secondary | ICD-10-CM | POA: Diagnosis not present

## 2019-03-26 DIAGNOSIS — F339 Major depressive disorder, recurrent, unspecified: Secondary | ICD-10-CM | POA: Diagnosis not present

## 2019-03-28 DIAGNOSIS — M6281 Muscle weakness (generalized): Secondary | ICD-10-CM | POA: Diagnosis not present

## 2019-03-28 DIAGNOSIS — I5032 Chronic diastolic (congestive) heart failure: Secondary | ICD-10-CM | POA: Diagnosis not present

## 2019-03-28 DIAGNOSIS — R2681 Unsteadiness on feet: Secondary | ICD-10-CM | POA: Diagnosis not present

## 2019-03-29 DIAGNOSIS — R2681 Unsteadiness on feet: Secondary | ICD-10-CM | POA: Diagnosis not present

## 2019-03-29 DIAGNOSIS — I5032 Chronic diastolic (congestive) heart failure: Secondary | ICD-10-CM | POA: Diagnosis not present

## 2019-03-29 DIAGNOSIS — M6281 Muscle weakness (generalized): Secondary | ICD-10-CM | POA: Diagnosis not present

## 2019-03-30 DIAGNOSIS — R2681 Unsteadiness on feet: Secondary | ICD-10-CM | POA: Diagnosis not present

## 2019-03-30 DIAGNOSIS — M6281 Muscle weakness (generalized): Secondary | ICD-10-CM | POA: Diagnosis not present

## 2019-03-30 DIAGNOSIS — I5032 Chronic diastolic (congestive) heart failure: Secondary | ICD-10-CM | POA: Diagnosis not present

## 2019-03-31 DIAGNOSIS — R2681 Unsteadiness on feet: Secondary | ICD-10-CM | POA: Diagnosis not present

## 2019-03-31 DIAGNOSIS — M6281 Muscle weakness (generalized): Secondary | ICD-10-CM | POA: Diagnosis not present

## 2019-03-31 DIAGNOSIS — I5032 Chronic diastolic (congestive) heart failure: Secondary | ICD-10-CM | POA: Diagnosis not present

## 2019-04-01 DIAGNOSIS — M6281 Muscle weakness (generalized): Secondary | ICD-10-CM | POA: Diagnosis not present

## 2019-04-01 DIAGNOSIS — R2681 Unsteadiness on feet: Secondary | ICD-10-CM | POA: Diagnosis not present

## 2019-04-01 DIAGNOSIS — I5032 Chronic diastolic (congestive) heart failure: Secondary | ICD-10-CM | POA: Diagnosis not present

## 2019-04-02 DIAGNOSIS — F339 Major depressive disorder, recurrent, unspecified: Secondary | ICD-10-CM | POA: Diagnosis not present

## 2019-04-04 DIAGNOSIS — M6281 Muscle weakness (generalized): Secondary | ICD-10-CM | POA: Diagnosis not present

## 2019-04-04 DIAGNOSIS — F0150 Vascular dementia without behavioral disturbance: Secondary | ICD-10-CM | POA: Diagnosis not present

## 2019-04-04 DIAGNOSIS — F320 Major depressive disorder, single episode, mild: Secondary | ICD-10-CM | POA: Diagnosis not present

## 2019-04-04 DIAGNOSIS — F5109 Other insomnia not due to a substance or known physiological condition: Secondary | ICD-10-CM | POA: Diagnosis not present

## 2019-04-04 DIAGNOSIS — R2681 Unsteadiness on feet: Secondary | ICD-10-CM | POA: Diagnosis not present

## 2019-04-04 DIAGNOSIS — I5032 Chronic diastolic (congestive) heart failure: Secondary | ICD-10-CM | POA: Diagnosis not present

## 2019-04-05 DIAGNOSIS — I10 Essential (primary) hypertension: Secondary | ICD-10-CM | POA: Diagnosis not present

## 2019-04-05 DIAGNOSIS — G9340 Encephalopathy, unspecified: Secondary | ICD-10-CM | POA: Diagnosis not present

## 2019-04-05 DIAGNOSIS — E1159 Type 2 diabetes mellitus with other circulatory complications: Secondary | ICD-10-CM | POA: Diagnosis not present

## 2019-04-05 DIAGNOSIS — I4811 Longstanding persistent atrial fibrillation: Secondary | ICD-10-CM | POA: Diagnosis not present

## 2019-04-05 DIAGNOSIS — R5383 Other fatigue: Secondary | ICD-10-CM | POA: Diagnosis not present

## 2019-04-05 DIAGNOSIS — R319 Hematuria, unspecified: Secondary | ICD-10-CM | POA: Diagnosis not present

## 2019-04-05 DIAGNOSIS — D649 Anemia, unspecified: Secondary | ICD-10-CM | POA: Diagnosis not present

## 2019-04-05 DIAGNOSIS — E119 Type 2 diabetes mellitus without complications: Secondary | ICD-10-CM | POA: Diagnosis not present

## 2019-04-06 DIAGNOSIS — R2681 Unsteadiness on feet: Secondary | ICD-10-CM | POA: Diagnosis not present

## 2019-04-06 DIAGNOSIS — M6281 Muscle weakness (generalized): Secondary | ICD-10-CM | POA: Diagnosis not present

## 2019-04-06 DIAGNOSIS — I5032 Chronic diastolic (congestive) heart failure: Secondary | ICD-10-CM | POA: Diagnosis not present

## 2019-04-07 DIAGNOSIS — M6281 Muscle weakness (generalized): Secondary | ICD-10-CM | POA: Diagnosis not present

## 2019-04-07 DIAGNOSIS — I5032 Chronic diastolic (congestive) heart failure: Secondary | ICD-10-CM | POA: Diagnosis not present

## 2019-04-07 DIAGNOSIS — R2681 Unsteadiness on feet: Secondary | ICD-10-CM | POA: Diagnosis not present

## 2019-04-08 DIAGNOSIS — I5032 Chronic diastolic (congestive) heart failure: Secondary | ICD-10-CM | POA: Diagnosis not present

## 2019-04-08 DIAGNOSIS — R2681 Unsteadiness on feet: Secondary | ICD-10-CM | POA: Diagnosis not present

## 2019-04-08 DIAGNOSIS — K219 Gastro-esophageal reflux disease without esophagitis: Secondary | ICD-10-CM | POA: Diagnosis not present

## 2019-04-08 DIAGNOSIS — I2510 Atherosclerotic heart disease of native coronary artery without angina pectoris: Secondary | ICD-10-CM | POA: Diagnosis not present

## 2019-04-08 DIAGNOSIS — I10 Essential (primary) hypertension: Secondary | ICD-10-CM | POA: Diagnosis not present

## 2019-04-08 DIAGNOSIS — M6281 Muscle weakness (generalized): Secondary | ICD-10-CM | POA: Diagnosis not present

## 2019-04-08 DIAGNOSIS — I4811 Longstanding persistent atrial fibrillation: Secondary | ICD-10-CM | POA: Diagnosis not present

## 2019-04-11 DIAGNOSIS — M6281 Muscle weakness (generalized): Secondary | ICD-10-CM | POA: Diagnosis not present

## 2019-04-11 DIAGNOSIS — R2681 Unsteadiness on feet: Secondary | ICD-10-CM | POA: Diagnosis not present

## 2019-04-11 DIAGNOSIS — I5032 Chronic diastolic (congestive) heart failure: Secondary | ICD-10-CM | POA: Diagnosis not present

## 2019-04-12 DIAGNOSIS — I5032 Chronic diastolic (congestive) heart failure: Secondary | ICD-10-CM | POA: Diagnosis not present

## 2019-04-12 DIAGNOSIS — M6281 Muscle weakness (generalized): Secondary | ICD-10-CM | POA: Diagnosis not present

## 2019-04-12 DIAGNOSIS — R2681 Unsteadiness on feet: Secondary | ICD-10-CM | POA: Diagnosis not present

## 2019-04-14 DIAGNOSIS — M6281 Muscle weakness (generalized): Secondary | ICD-10-CM | POA: Diagnosis not present

## 2019-04-14 DIAGNOSIS — R2681 Unsteadiness on feet: Secondary | ICD-10-CM | POA: Diagnosis not present

## 2019-04-14 DIAGNOSIS — I5032 Chronic diastolic (congestive) heart failure: Secondary | ICD-10-CM | POA: Diagnosis not present

## 2019-04-15 DIAGNOSIS — F339 Major depressive disorder, recurrent, unspecified: Secondary | ICD-10-CM | POA: Diagnosis not present

## 2019-04-15 DIAGNOSIS — M6281 Muscle weakness (generalized): Secondary | ICD-10-CM | POA: Diagnosis not present

## 2019-04-15 DIAGNOSIS — R2681 Unsteadiness on feet: Secondary | ICD-10-CM | POA: Diagnosis not present

## 2019-04-15 DIAGNOSIS — I5032 Chronic diastolic (congestive) heart failure: Secondary | ICD-10-CM | POA: Diagnosis not present

## 2019-04-17 DIAGNOSIS — I10 Essential (primary) hypertension: Secondary | ICD-10-CM | POA: Diagnosis not present

## 2019-04-17 DIAGNOSIS — J449 Chronic obstructive pulmonary disease, unspecified: Secondary | ICD-10-CM | POA: Diagnosis not present

## 2019-04-18 DIAGNOSIS — R2681 Unsteadiness on feet: Secondary | ICD-10-CM | POA: Diagnosis not present

## 2019-04-18 DIAGNOSIS — M6281 Muscle weakness (generalized): Secondary | ICD-10-CM | POA: Diagnosis not present

## 2019-04-18 DIAGNOSIS — I5032 Chronic diastolic (congestive) heart failure: Secondary | ICD-10-CM | POA: Diagnosis not present

## 2019-04-20 DIAGNOSIS — I5032 Chronic diastolic (congestive) heart failure: Secondary | ICD-10-CM | POA: Diagnosis not present

## 2019-04-20 DIAGNOSIS — M6281 Muscle weakness (generalized): Secondary | ICD-10-CM | POA: Diagnosis not present

## 2019-04-20 DIAGNOSIS — R2681 Unsteadiness on feet: Secondary | ICD-10-CM | POA: Diagnosis not present

## 2019-04-21 DIAGNOSIS — I4811 Longstanding persistent atrial fibrillation: Secondary | ICD-10-CM | POA: Diagnosis not present

## 2019-04-21 DIAGNOSIS — K219 Gastro-esophageal reflux disease without esophagitis: Secondary | ICD-10-CM | POA: Diagnosis not present

## 2019-04-21 DIAGNOSIS — I10 Essential (primary) hypertension: Secondary | ICD-10-CM | POA: Diagnosis not present

## 2019-04-21 DIAGNOSIS — M6281 Muscle weakness (generalized): Secondary | ICD-10-CM | POA: Diagnosis not present

## 2019-04-21 DIAGNOSIS — R001 Bradycardia, unspecified: Secondary | ICD-10-CM | POA: Diagnosis not present

## 2019-04-21 DIAGNOSIS — I5032 Chronic diastolic (congestive) heart failure: Secondary | ICD-10-CM | POA: Diagnosis not present

## 2019-04-21 DIAGNOSIS — R2681 Unsteadiness on feet: Secondary | ICD-10-CM | POA: Diagnosis not present

## 2019-04-22 DIAGNOSIS — M6281 Muscle weakness (generalized): Secondary | ICD-10-CM | POA: Diagnosis not present

## 2019-04-22 DIAGNOSIS — F339 Major depressive disorder, recurrent, unspecified: Secondary | ICD-10-CM | POA: Diagnosis not present

## 2019-04-22 DIAGNOSIS — R2681 Unsteadiness on feet: Secondary | ICD-10-CM | POA: Diagnosis not present

## 2019-04-22 DIAGNOSIS — I5032 Chronic diastolic (congestive) heart failure: Secondary | ICD-10-CM | POA: Diagnosis not present

## 2019-04-25 DIAGNOSIS — M6281 Muscle weakness (generalized): Secondary | ICD-10-CM | POA: Diagnosis not present

## 2019-04-25 DIAGNOSIS — I5032 Chronic diastolic (congestive) heart failure: Secondary | ICD-10-CM | POA: Diagnosis not present

## 2019-04-25 DIAGNOSIS — R2681 Unsteadiness on feet: Secondary | ICD-10-CM | POA: Diagnosis not present

## 2019-04-26 DIAGNOSIS — R2681 Unsteadiness on feet: Secondary | ICD-10-CM | POA: Diagnosis not present

## 2019-04-26 DIAGNOSIS — I5032 Chronic diastolic (congestive) heart failure: Secondary | ICD-10-CM | POA: Diagnosis not present

## 2019-04-26 DIAGNOSIS — M6281 Muscle weakness (generalized): Secondary | ICD-10-CM | POA: Diagnosis not present

## 2019-04-27 DIAGNOSIS — I5032 Chronic diastolic (congestive) heart failure: Secondary | ICD-10-CM | POA: Diagnosis not present

## 2019-04-27 DIAGNOSIS — M6281 Muscle weakness (generalized): Secondary | ICD-10-CM | POA: Diagnosis not present

## 2019-04-27 DIAGNOSIS — R2681 Unsteadiness on feet: Secondary | ICD-10-CM | POA: Diagnosis not present

## 2019-04-28 DIAGNOSIS — R2681 Unsteadiness on feet: Secondary | ICD-10-CM | POA: Diagnosis not present

## 2019-04-28 DIAGNOSIS — I5032 Chronic diastolic (congestive) heart failure: Secondary | ICD-10-CM | POA: Diagnosis not present

## 2019-04-28 DIAGNOSIS — M6281 Muscle weakness (generalized): Secondary | ICD-10-CM | POA: Diagnosis not present

## 2019-04-29 DIAGNOSIS — R2681 Unsteadiness on feet: Secondary | ICD-10-CM | POA: Diagnosis not present

## 2019-04-29 DIAGNOSIS — M6281 Muscle weakness (generalized): Secondary | ICD-10-CM | POA: Diagnosis not present

## 2019-04-29 DIAGNOSIS — I5032 Chronic diastolic (congestive) heart failure: Secondary | ICD-10-CM | POA: Diagnosis not present

## 2019-04-30 DIAGNOSIS — F339 Major depressive disorder, recurrent, unspecified: Secondary | ICD-10-CM | POA: Diagnosis not present

## 2019-05-02 DIAGNOSIS — I5032 Chronic diastolic (congestive) heart failure: Secondary | ICD-10-CM | POA: Diagnosis not present

## 2019-05-02 DIAGNOSIS — M6281 Muscle weakness (generalized): Secondary | ICD-10-CM | POA: Diagnosis not present

## 2019-05-02 DIAGNOSIS — R2681 Unsteadiness on feet: Secondary | ICD-10-CM | POA: Diagnosis not present

## 2019-05-03 DIAGNOSIS — I5032 Chronic diastolic (congestive) heart failure: Secondary | ICD-10-CM | POA: Diagnosis not present

## 2019-05-03 DIAGNOSIS — R2681 Unsteadiness on feet: Secondary | ICD-10-CM | POA: Diagnosis not present

## 2019-05-03 DIAGNOSIS — M6281 Muscle weakness (generalized): Secondary | ICD-10-CM | POA: Diagnosis not present

## 2019-05-04 DIAGNOSIS — R2681 Unsteadiness on feet: Secondary | ICD-10-CM | POA: Diagnosis not present

## 2019-05-04 DIAGNOSIS — I5032 Chronic diastolic (congestive) heart failure: Secondary | ICD-10-CM | POA: Diagnosis not present

## 2019-05-04 DIAGNOSIS — M6281 Muscle weakness (generalized): Secondary | ICD-10-CM | POA: Diagnosis not present

## 2019-05-05 DIAGNOSIS — R2681 Unsteadiness on feet: Secondary | ICD-10-CM | POA: Diagnosis not present

## 2019-05-05 DIAGNOSIS — I5032 Chronic diastolic (congestive) heart failure: Secondary | ICD-10-CM | POA: Diagnosis not present

## 2019-05-05 DIAGNOSIS — M6281 Muscle weakness (generalized): Secondary | ICD-10-CM | POA: Diagnosis not present

## 2019-05-06 DIAGNOSIS — F70 Mild intellectual disabilities: Secondary | ICD-10-CM | POA: Diagnosis not present

## 2019-05-06 DIAGNOSIS — R2681 Unsteadiness on feet: Secondary | ICD-10-CM | POA: Diagnosis not present

## 2019-05-06 DIAGNOSIS — I2510 Atherosclerotic heart disease of native coronary artery without angina pectoris: Secondary | ICD-10-CM | POA: Diagnosis not present

## 2019-05-06 DIAGNOSIS — F330 Major depressive disorder, recurrent, mild: Secondary | ICD-10-CM | POA: Diagnosis not present

## 2019-05-06 DIAGNOSIS — K219 Gastro-esophageal reflux disease without esophagitis: Secondary | ICD-10-CM | POA: Diagnosis not present

## 2019-05-06 DIAGNOSIS — M6281 Muscle weakness (generalized): Secondary | ICD-10-CM | POA: Diagnosis not present

## 2019-05-06 DIAGNOSIS — I4811 Longstanding persistent atrial fibrillation: Secondary | ICD-10-CM | POA: Diagnosis not present

## 2019-05-06 DIAGNOSIS — F339 Major depressive disorder, recurrent, unspecified: Secondary | ICD-10-CM | POA: Diagnosis not present

## 2019-05-06 DIAGNOSIS — R001 Bradycardia, unspecified: Secondary | ICD-10-CM | POA: Diagnosis not present

## 2019-05-06 DIAGNOSIS — I5032 Chronic diastolic (congestive) heart failure: Secondary | ICD-10-CM | POA: Diagnosis not present

## 2019-05-09 DIAGNOSIS — M6281 Muscle weakness (generalized): Secondary | ICD-10-CM | POA: Diagnosis not present

## 2019-05-09 DIAGNOSIS — R2681 Unsteadiness on feet: Secondary | ICD-10-CM | POA: Diagnosis not present

## 2019-05-09 DIAGNOSIS — I5032 Chronic diastolic (congestive) heart failure: Secondary | ICD-10-CM | POA: Diagnosis not present

## 2019-05-10 DIAGNOSIS — I5032 Chronic diastolic (congestive) heart failure: Secondary | ICD-10-CM | POA: Diagnosis not present

## 2019-05-10 DIAGNOSIS — M6281 Muscle weakness (generalized): Secondary | ICD-10-CM | POA: Diagnosis not present

## 2019-05-10 DIAGNOSIS — R2681 Unsteadiness on feet: Secondary | ICD-10-CM | POA: Diagnosis not present

## 2019-05-11 DIAGNOSIS — M6281 Muscle weakness (generalized): Secondary | ICD-10-CM | POA: Diagnosis not present

## 2019-05-11 DIAGNOSIS — I5032 Chronic diastolic (congestive) heart failure: Secondary | ICD-10-CM | POA: Diagnosis not present

## 2019-05-11 DIAGNOSIS — R2681 Unsteadiness on feet: Secondary | ICD-10-CM | POA: Diagnosis not present

## 2019-05-11 DIAGNOSIS — F339 Major depressive disorder, recurrent, unspecified: Secondary | ICD-10-CM | POA: Diagnosis not present

## 2019-05-12 DIAGNOSIS — R2681 Unsteadiness on feet: Secondary | ICD-10-CM | POA: Diagnosis not present

## 2019-05-12 DIAGNOSIS — M6281 Muscle weakness (generalized): Secondary | ICD-10-CM | POA: Diagnosis not present

## 2019-05-12 DIAGNOSIS — I5032 Chronic diastolic (congestive) heart failure: Secondary | ICD-10-CM | POA: Diagnosis not present

## 2019-05-12 DIAGNOSIS — F339 Major depressive disorder, recurrent, unspecified: Secondary | ICD-10-CM | POA: Diagnosis not present

## 2019-05-13 DIAGNOSIS — M6281 Muscle weakness (generalized): Secondary | ICD-10-CM | POA: Diagnosis not present

## 2019-05-13 DIAGNOSIS — I5032 Chronic diastolic (congestive) heart failure: Secondary | ICD-10-CM | POA: Diagnosis not present

## 2019-05-13 DIAGNOSIS — R2681 Unsteadiness on feet: Secondary | ICD-10-CM | POA: Diagnosis not present

## 2019-05-16 DIAGNOSIS — J449 Chronic obstructive pulmonary disease, unspecified: Secondary | ICD-10-CM | POA: Diagnosis not present

## 2019-05-16 DIAGNOSIS — R2681 Unsteadiness on feet: Secondary | ICD-10-CM | POA: Diagnosis not present

## 2019-05-16 DIAGNOSIS — M6281 Muscle weakness (generalized): Secondary | ICD-10-CM | POA: Diagnosis not present

## 2019-05-16 DIAGNOSIS — I5032 Chronic diastolic (congestive) heart failure: Secondary | ICD-10-CM | POA: Diagnosis not present

## 2019-05-16 DIAGNOSIS — I10 Essential (primary) hypertension: Secondary | ICD-10-CM | POA: Diagnosis not present

## 2019-05-17 DIAGNOSIS — M6281 Muscle weakness (generalized): Secondary | ICD-10-CM | POA: Diagnosis not present

## 2019-05-17 DIAGNOSIS — I5032 Chronic diastolic (congestive) heart failure: Secondary | ICD-10-CM | POA: Diagnosis not present

## 2019-05-17 DIAGNOSIS — R2681 Unsteadiness on feet: Secondary | ICD-10-CM | POA: Diagnosis not present

## 2019-05-18 DIAGNOSIS — I5032 Chronic diastolic (congestive) heart failure: Secondary | ICD-10-CM | POA: Diagnosis not present

## 2019-05-18 DIAGNOSIS — M6281 Muscle weakness (generalized): Secondary | ICD-10-CM | POA: Diagnosis not present

## 2019-05-18 DIAGNOSIS — R2681 Unsteadiness on feet: Secondary | ICD-10-CM | POA: Diagnosis not present

## 2019-05-19 ENCOUNTER — Other Ambulatory Visit: Payer: Self-pay

## 2019-05-19 DIAGNOSIS — K219 Gastro-esophageal reflux disease without esophagitis: Secondary | ICD-10-CM | POA: Diagnosis not present

## 2019-05-19 DIAGNOSIS — M6281 Muscle weakness (generalized): Secondary | ICD-10-CM | POA: Diagnosis not present

## 2019-05-19 DIAGNOSIS — I4811 Longstanding persistent atrial fibrillation: Secondary | ICD-10-CM | POA: Diagnosis not present

## 2019-05-19 DIAGNOSIS — I10 Essential (primary) hypertension: Secondary | ICD-10-CM | POA: Diagnosis not present

## 2019-05-19 DIAGNOSIS — R2681 Unsteadiness on feet: Secondary | ICD-10-CM | POA: Diagnosis not present

## 2019-05-19 DIAGNOSIS — F5101 Primary insomnia: Secondary | ICD-10-CM | POA: Diagnosis not present

## 2019-05-19 DIAGNOSIS — F0150 Vascular dementia without behavioral disturbance: Secondary | ICD-10-CM | POA: Diagnosis not present

## 2019-05-19 DIAGNOSIS — F331 Major depressive disorder, recurrent, moderate: Secondary | ICD-10-CM | POA: Diagnosis not present

## 2019-05-19 DIAGNOSIS — R001 Bradycardia, unspecified: Secondary | ICD-10-CM | POA: Diagnosis not present

## 2019-05-19 DIAGNOSIS — I5032 Chronic diastolic (congestive) heart failure: Secondary | ICD-10-CM | POA: Diagnosis not present

## 2019-05-20 DIAGNOSIS — R2681 Unsteadiness on feet: Secondary | ICD-10-CM | POA: Diagnosis not present

## 2019-05-20 DIAGNOSIS — M6281 Muscle weakness (generalized): Secondary | ICD-10-CM | POA: Diagnosis not present

## 2019-05-20 DIAGNOSIS — I5032 Chronic diastolic (congestive) heart failure: Secondary | ICD-10-CM | POA: Diagnosis not present

## 2019-05-23 DIAGNOSIS — I5032 Chronic diastolic (congestive) heart failure: Secondary | ICD-10-CM | POA: Diagnosis not present

## 2019-05-23 DIAGNOSIS — R2681 Unsteadiness on feet: Secondary | ICD-10-CM | POA: Diagnosis not present

## 2019-05-23 DIAGNOSIS — M6281 Muscle weakness (generalized): Secondary | ICD-10-CM | POA: Diagnosis not present

## 2019-05-24 DIAGNOSIS — M6281 Muscle weakness (generalized): Secondary | ICD-10-CM | POA: Diagnosis not present

## 2019-05-24 DIAGNOSIS — R2681 Unsteadiness on feet: Secondary | ICD-10-CM | POA: Diagnosis not present

## 2019-05-24 DIAGNOSIS — I5032 Chronic diastolic (congestive) heart failure: Secondary | ICD-10-CM | POA: Diagnosis not present

## 2019-05-25 DIAGNOSIS — I5032 Chronic diastolic (congestive) heart failure: Secondary | ICD-10-CM | POA: Diagnosis not present

## 2019-05-25 DIAGNOSIS — M6281 Muscle weakness (generalized): Secondary | ICD-10-CM | POA: Diagnosis not present

## 2019-05-25 DIAGNOSIS — R2681 Unsteadiness on feet: Secondary | ICD-10-CM | POA: Diagnosis not present

## 2019-05-26 DIAGNOSIS — M6281 Muscle weakness (generalized): Secondary | ICD-10-CM | POA: Diagnosis not present

## 2019-05-26 DIAGNOSIS — R2681 Unsteadiness on feet: Secondary | ICD-10-CM | POA: Diagnosis not present

## 2019-05-26 DIAGNOSIS — F339 Major depressive disorder, recurrent, unspecified: Secondary | ICD-10-CM | POA: Diagnosis not present

## 2019-05-26 DIAGNOSIS — I5032 Chronic diastolic (congestive) heart failure: Secondary | ICD-10-CM | POA: Diagnosis not present

## 2019-05-30 DIAGNOSIS — M6281 Muscle weakness (generalized): Secondary | ICD-10-CM | POA: Diagnosis not present

## 2019-05-30 DIAGNOSIS — R2681 Unsteadiness on feet: Secondary | ICD-10-CM | POA: Diagnosis not present

## 2019-05-30 DIAGNOSIS — I5032 Chronic diastolic (congestive) heart failure: Secondary | ICD-10-CM | POA: Diagnosis not present

## 2019-06-01 DIAGNOSIS — F339 Major depressive disorder, recurrent, unspecified: Secondary | ICD-10-CM | POA: Diagnosis not present

## 2019-06-03 DIAGNOSIS — I4811 Longstanding persistent atrial fibrillation: Secondary | ICD-10-CM | POA: Diagnosis not present

## 2019-06-03 DIAGNOSIS — I10 Essential (primary) hypertension: Secondary | ICD-10-CM | POA: Diagnosis not present

## 2019-06-03 DIAGNOSIS — R001 Bradycardia, unspecified: Secondary | ICD-10-CM | POA: Diagnosis not present

## 2019-06-03 DIAGNOSIS — K219 Gastro-esophageal reflux disease without esophagitis: Secondary | ICD-10-CM | POA: Diagnosis not present

## 2019-06-10 DIAGNOSIS — J449 Chronic obstructive pulmonary disease, unspecified: Secondary | ICD-10-CM | POA: Diagnosis not present

## 2019-06-10 DIAGNOSIS — I10 Essential (primary) hypertension: Secondary | ICD-10-CM | POA: Diagnosis not present

## 2019-06-11 DIAGNOSIS — E559 Vitamin D deficiency, unspecified: Secondary | ICD-10-CM | POA: Diagnosis not present

## 2019-06-11 DIAGNOSIS — D649 Anemia, unspecified: Secondary | ICD-10-CM | POA: Diagnosis not present

## 2019-06-11 DIAGNOSIS — I5032 Chronic diastolic (congestive) heart failure: Secondary | ICD-10-CM | POA: Diagnosis not present

## 2019-06-11 DIAGNOSIS — F339 Major depressive disorder, recurrent, unspecified: Secondary | ICD-10-CM | POA: Diagnosis not present

## 2019-06-11 DIAGNOSIS — E039 Hypothyroidism, unspecified: Secondary | ICD-10-CM | POA: Diagnosis not present

## 2019-06-11 DIAGNOSIS — E119 Type 2 diabetes mellitus without complications: Secondary | ICD-10-CM | POA: Diagnosis not present

## 2019-06-11 DIAGNOSIS — E785 Hyperlipidemia, unspecified: Secondary | ICD-10-CM | POA: Diagnosis not present

## 2019-06-16 DIAGNOSIS — F70 Mild intellectual disabilities: Secondary | ICD-10-CM | POA: Diagnosis not present

## 2019-06-16 DIAGNOSIS — F5101 Primary insomnia: Secondary | ICD-10-CM | POA: Diagnosis not present

## 2019-06-16 DIAGNOSIS — F330 Major depressive disorder, recurrent, mild: Secondary | ICD-10-CM | POA: Diagnosis not present

## 2019-06-16 DIAGNOSIS — F331 Major depressive disorder, recurrent, moderate: Secondary | ICD-10-CM | POA: Diagnosis not present

## 2019-06-16 DIAGNOSIS — I10 Essential (primary) hypertension: Secondary | ICD-10-CM | POA: Diagnosis not present

## 2019-06-16 DIAGNOSIS — F0150 Vascular dementia without behavioral disturbance: Secondary | ICD-10-CM | POA: Diagnosis not present

## 2019-06-17 DIAGNOSIS — F339 Major depressive disorder, recurrent, unspecified: Secondary | ICD-10-CM | POA: Diagnosis not present

## 2019-06-23 DIAGNOSIS — F339 Major depressive disorder, recurrent, unspecified: Secondary | ICD-10-CM | POA: Diagnosis not present

## 2019-06-30 DIAGNOSIS — F339 Major depressive disorder, recurrent, unspecified: Secondary | ICD-10-CM | POA: Diagnosis not present

## 2019-07-01 DIAGNOSIS — I5032 Chronic diastolic (congestive) heart failure: Secondary | ICD-10-CM | POA: Diagnosis not present

## 2019-07-01 DIAGNOSIS — I10 Essential (primary) hypertension: Secondary | ICD-10-CM | POA: Diagnosis not present

## 2019-07-01 DIAGNOSIS — I2510 Atherosclerotic heart disease of native coronary artery without angina pectoris: Secondary | ICD-10-CM | POA: Diagnosis not present

## 2019-07-01 DIAGNOSIS — I4811 Longstanding persistent atrial fibrillation: Secondary | ICD-10-CM | POA: Diagnosis not present

## 2019-07-07 DIAGNOSIS — F339 Major depressive disorder, recurrent, unspecified: Secondary | ICD-10-CM | POA: Diagnosis not present

## 2019-07-11 DIAGNOSIS — Z20828 Contact with and (suspected) exposure to other viral communicable diseases: Secondary | ICD-10-CM | POA: Diagnosis not present

## 2019-07-12 DIAGNOSIS — F339 Major depressive disorder, recurrent, unspecified: Secondary | ICD-10-CM | POA: Diagnosis not present

## 2019-07-14 DIAGNOSIS — I5032 Chronic diastolic (congestive) heart failure: Secondary | ICD-10-CM | POA: Diagnosis not present

## 2019-07-14 DIAGNOSIS — I4811 Longstanding persistent atrial fibrillation: Secondary | ICD-10-CM | POA: Diagnosis not present

## 2019-07-14 DIAGNOSIS — I2510 Atherosclerotic heart disease of native coronary artery without angina pectoris: Secondary | ICD-10-CM | POA: Diagnosis not present

## 2019-07-14 DIAGNOSIS — I10 Essential (primary) hypertension: Secondary | ICD-10-CM | POA: Diagnosis not present

## 2019-07-14 DIAGNOSIS — J449 Chronic obstructive pulmonary disease, unspecified: Secondary | ICD-10-CM | POA: Diagnosis not present

## 2019-07-21 DIAGNOSIS — F0150 Vascular dementia without behavioral disturbance: Secondary | ICD-10-CM | POA: Diagnosis not present

## 2019-07-21 DIAGNOSIS — F330 Major depressive disorder, recurrent, mild: Secondary | ICD-10-CM | POA: Diagnosis not present

## 2019-07-21 DIAGNOSIS — G4701 Insomnia due to medical condition: Secondary | ICD-10-CM | POA: Diagnosis not present

## 2019-07-23 DIAGNOSIS — R41841 Cognitive communication deficit: Secondary | ICD-10-CM | POA: Diagnosis not present

## 2019-07-23 DIAGNOSIS — R1311 Dysphagia, oral phase: Secondary | ICD-10-CM | POA: Diagnosis not present

## 2019-07-23 DIAGNOSIS — F0390 Unspecified dementia without behavioral disturbance: Secondary | ICD-10-CM | POA: Diagnosis not present

## 2019-07-23 DIAGNOSIS — K219 Gastro-esophageal reflux disease without esophagitis: Secondary | ICD-10-CM | POA: Diagnosis not present

## 2019-07-23 DIAGNOSIS — Z23 Encounter for immunization: Secondary | ICD-10-CM | POA: Diagnosis not present

## 2019-07-28 DIAGNOSIS — Z794 Long term (current) use of insulin: Secondary | ICD-10-CM | POA: Diagnosis not present

## 2019-07-28 DIAGNOSIS — Z961 Presence of intraocular lens: Secondary | ICD-10-CM | POA: Diagnosis not present

## 2019-07-28 DIAGNOSIS — E113293 Type 2 diabetes mellitus with mild nonproliferative diabetic retinopathy without macular edema, bilateral: Secondary | ICD-10-CM | POA: Diagnosis not present

## 2019-07-28 DIAGNOSIS — H04123 Dry eye syndrome of bilateral lacrimal glands: Secondary | ICD-10-CM | POA: Diagnosis not present

## 2019-08-01 DIAGNOSIS — R630 Anorexia: Secondary | ICD-10-CM | POA: Diagnosis not present

## 2019-08-01 DIAGNOSIS — I5032 Chronic diastolic (congestive) heart failure: Secondary | ICD-10-CM | POA: Diagnosis not present

## 2019-08-01 DIAGNOSIS — I10 Essential (primary) hypertension: Secondary | ICD-10-CM | POA: Diagnosis not present

## 2019-08-01 DIAGNOSIS — F339 Major depressive disorder, recurrent, unspecified: Secondary | ICD-10-CM | POA: Diagnosis not present

## 2019-08-01 DIAGNOSIS — I2510 Atherosclerotic heart disease of native coronary artery without angina pectoris: Secondary | ICD-10-CM | POA: Diagnosis not present

## 2019-08-08 DIAGNOSIS — F339 Major depressive disorder, recurrent, unspecified: Secondary | ICD-10-CM | POA: Diagnosis not present

## 2019-08-10 DIAGNOSIS — F0390 Unspecified dementia without behavioral disturbance: Secondary | ICD-10-CM | POA: Diagnosis not present

## 2019-08-10 DIAGNOSIS — R1311 Dysphagia, oral phase: Secondary | ICD-10-CM | POA: Diagnosis not present

## 2019-08-10 DIAGNOSIS — Z23 Encounter for immunization: Secondary | ICD-10-CM | POA: Diagnosis not present

## 2019-08-10 DIAGNOSIS — R41841 Cognitive communication deficit: Secondary | ICD-10-CM | POA: Diagnosis not present

## 2019-08-10 DIAGNOSIS — K219 Gastro-esophageal reflux disease without esophagitis: Secondary | ICD-10-CM | POA: Diagnosis not present

## 2019-08-11 DIAGNOSIS — R41841 Cognitive communication deficit: Secondary | ICD-10-CM | POA: Diagnosis not present

## 2019-08-11 DIAGNOSIS — K219 Gastro-esophageal reflux disease without esophagitis: Secondary | ICD-10-CM | POA: Diagnosis not present

## 2019-08-11 DIAGNOSIS — I10 Essential (primary) hypertension: Secondary | ICD-10-CM | POA: Diagnosis not present

## 2019-08-11 DIAGNOSIS — I2510 Atherosclerotic heart disease of native coronary artery without angina pectoris: Secondary | ICD-10-CM | POA: Diagnosis not present

## 2019-08-11 DIAGNOSIS — Z23 Encounter for immunization: Secondary | ICD-10-CM | POA: Diagnosis not present

## 2019-08-11 DIAGNOSIS — F0390 Unspecified dementia without behavioral disturbance: Secondary | ICD-10-CM | POA: Diagnosis not present

## 2019-08-11 DIAGNOSIS — J449 Chronic obstructive pulmonary disease, unspecified: Secondary | ICD-10-CM | POA: Diagnosis not present

## 2019-08-11 DIAGNOSIS — R1311 Dysphagia, oral phase: Secondary | ICD-10-CM | POA: Diagnosis not present

## 2019-08-11 DIAGNOSIS — R630 Anorexia: Secondary | ICD-10-CM | POA: Diagnosis not present

## 2019-08-12 DIAGNOSIS — R1311 Dysphagia, oral phase: Secondary | ICD-10-CM | POA: Diagnosis not present

## 2019-08-12 DIAGNOSIS — K219 Gastro-esophageal reflux disease without esophagitis: Secondary | ICD-10-CM | POA: Diagnosis not present

## 2019-08-12 DIAGNOSIS — F0390 Unspecified dementia without behavioral disturbance: Secondary | ICD-10-CM | POA: Diagnosis not present

## 2019-08-12 DIAGNOSIS — Z23 Encounter for immunization: Secondary | ICD-10-CM | POA: Diagnosis not present

## 2019-08-12 DIAGNOSIS — R41841 Cognitive communication deficit: Secondary | ICD-10-CM | POA: Diagnosis not present

## 2019-08-15 DIAGNOSIS — R41841 Cognitive communication deficit: Secondary | ICD-10-CM | POA: Diagnosis not present

## 2019-08-15 DIAGNOSIS — K219 Gastro-esophageal reflux disease without esophagitis: Secondary | ICD-10-CM | POA: Diagnosis not present

## 2019-08-15 DIAGNOSIS — Z20828 Contact with and (suspected) exposure to other viral communicable diseases: Secondary | ICD-10-CM | POA: Diagnosis not present

## 2019-08-15 DIAGNOSIS — R1311 Dysphagia, oral phase: Secondary | ICD-10-CM | POA: Diagnosis not present

## 2019-08-15 DIAGNOSIS — F0390 Unspecified dementia without behavioral disturbance: Secondary | ICD-10-CM | POA: Diagnosis not present

## 2019-08-15 DIAGNOSIS — Z23 Encounter for immunization: Secondary | ICD-10-CM | POA: Diagnosis not present

## 2019-08-17 DIAGNOSIS — Z23 Encounter for immunization: Secondary | ICD-10-CM | POA: Diagnosis not present

## 2019-08-17 DIAGNOSIS — F339 Major depressive disorder, recurrent, unspecified: Secondary | ICD-10-CM | POA: Diagnosis not present

## 2019-08-17 DIAGNOSIS — K219 Gastro-esophageal reflux disease without esophagitis: Secondary | ICD-10-CM | POA: Diagnosis not present

## 2019-08-17 DIAGNOSIS — J449 Chronic obstructive pulmonary disease, unspecified: Secondary | ICD-10-CM | POA: Diagnosis not present

## 2019-08-17 DIAGNOSIS — R1311 Dysphagia, oral phase: Secondary | ICD-10-CM | POA: Diagnosis not present

## 2019-08-17 DIAGNOSIS — I10 Essential (primary) hypertension: Secondary | ICD-10-CM | POA: Diagnosis not present

## 2019-08-17 DIAGNOSIS — F0390 Unspecified dementia without behavioral disturbance: Secondary | ICD-10-CM | POA: Diagnosis not present

## 2019-08-17 DIAGNOSIS — R41841 Cognitive communication deficit: Secondary | ICD-10-CM | POA: Diagnosis not present

## 2019-08-18 DIAGNOSIS — F0150 Vascular dementia without behavioral disturbance: Secondary | ICD-10-CM | POA: Diagnosis not present

## 2019-08-18 DIAGNOSIS — R634 Abnormal weight loss: Secondary | ICD-10-CM | POA: Diagnosis not present

## 2019-08-18 DIAGNOSIS — G4701 Insomnia due to medical condition: Secondary | ICD-10-CM | POA: Diagnosis not present

## 2019-08-18 DIAGNOSIS — F330 Major depressive disorder, recurrent, mild: Secondary | ICD-10-CM | POA: Diagnosis not present

## 2019-08-19 DIAGNOSIS — U071 COVID-19: Secondary | ICD-10-CM | POA: Diagnosis not present

## 2019-08-19 DIAGNOSIS — R197 Diarrhea, unspecified: Secondary | ICD-10-CM | POA: Diagnosis not present

## 2019-08-19 DIAGNOSIS — R1110 Vomiting, unspecified: Secondary | ICD-10-CM | POA: Diagnosis not present

## 2019-08-27 DIAGNOSIS — F339 Major depressive disorder, recurrent, unspecified: Secondary | ICD-10-CM | POA: Diagnosis not present

## 2019-09-01 DIAGNOSIS — F339 Major depressive disorder, recurrent, unspecified: Secondary | ICD-10-CM | POA: Diagnosis not present

## 2019-09-06 DIAGNOSIS — F339 Major depressive disorder, recurrent, unspecified: Secondary | ICD-10-CM | POA: Diagnosis not present

## 2019-09-10 DIAGNOSIS — E119 Type 2 diabetes mellitus without complications: Secondary | ICD-10-CM | POA: Diagnosis not present

## 2019-09-10 DIAGNOSIS — U071 COVID-19: Secondary | ICD-10-CM | POA: Diagnosis not present

## 2019-09-10 DIAGNOSIS — K219 Gastro-esophageal reflux disease without esophagitis: Secondary | ICD-10-CM | POA: Diagnosis not present

## 2019-09-10 DIAGNOSIS — N189 Chronic kidney disease, unspecified: Secondary | ICD-10-CM | POA: Diagnosis not present

## 2019-09-11 DIAGNOSIS — F339 Major depressive disorder, recurrent, unspecified: Secondary | ICD-10-CM | POA: Diagnosis not present

## 2019-09-12 DIAGNOSIS — I10 Essential (primary) hypertension: Secondary | ICD-10-CM | POA: Diagnosis not present

## 2019-09-12 DIAGNOSIS — J449 Chronic obstructive pulmonary disease, unspecified: Secondary | ICD-10-CM | POA: Diagnosis not present

## 2019-09-14 DIAGNOSIS — F0150 Vascular dementia without behavioral disturbance: Secondary | ICD-10-CM | POA: Diagnosis not present

## 2019-09-14 DIAGNOSIS — F330 Major depressive disorder, recurrent, mild: Secondary | ICD-10-CM | POA: Diagnosis not present

## 2019-09-14 DIAGNOSIS — G4701 Insomnia due to medical condition: Secondary | ICD-10-CM | POA: Diagnosis not present

## 2019-09-14 DIAGNOSIS — R634 Abnormal weight loss: Secondary | ICD-10-CM | POA: Diagnosis not present

## 2019-09-22 DIAGNOSIS — E1140 Type 2 diabetes mellitus with diabetic neuropathy, unspecified: Secondary | ICD-10-CM | POA: Diagnosis not present

## 2020-01-28 ENCOUNTER — Inpatient Hospital Stay (HOSPITAL_COMMUNITY): Payer: Medicare Other

## 2020-01-28 ENCOUNTER — Encounter (HOSPITAL_COMMUNITY): Payer: Self-pay | Admitting: Emergency Medicine

## 2020-01-28 ENCOUNTER — Emergency Department (HOSPITAL_COMMUNITY): Payer: Medicare Other

## 2020-01-28 ENCOUNTER — Inpatient Hospital Stay (HOSPITAL_COMMUNITY)
Admission: EM | Admit: 2020-01-28 | Discharge: 2020-02-02 | DRG: 871 | Disposition: A | Discharge status: Skilled Nursing Facility | Payer: Medicare Other | Source: Skilled Nursing Facility | Attending: Internal Medicine | Admitting: Internal Medicine

## 2020-01-28 DIAGNOSIS — E162 Hypoglycemia, unspecified: Secondary | ICD-10-CM | POA: Diagnosis present

## 2020-01-28 DIAGNOSIS — I342 Nonrheumatic mitral (valve) stenosis: Secondary | ICD-10-CM | POA: Diagnosis not present

## 2020-01-28 DIAGNOSIS — E785 Hyperlipidemia, unspecified: Secondary | ICD-10-CM | POA: Diagnosis present

## 2020-01-28 DIAGNOSIS — R6521 Severe sepsis with septic shock: Secondary | ICD-10-CM | POA: Diagnosis present

## 2020-01-28 DIAGNOSIS — N179 Acute kidney failure, unspecified: Secondary | ICD-10-CM | POA: Diagnosis present

## 2020-01-28 DIAGNOSIS — R1310 Dysphagia, unspecified: Secondary | ICD-10-CM | POA: Diagnosis not present

## 2020-01-28 DIAGNOSIS — L89621 Pressure ulcer of left heel, stage 1: Secondary | ICD-10-CM | POA: Diagnosis present

## 2020-01-28 DIAGNOSIS — R680 Hypothermia, not associated with low environmental temperature: Secondary | ICD-10-CM | POA: Diagnosis present

## 2020-01-28 DIAGNOSIS — F0390 Unspecified dementia without behavioral disturbance: Secondary | ICD-10-CM | POA: Diagnosis present

## 2020-01-28 DIAGNOSIS — R6520 Severe sepsis without septic shock: Secondary | ICD-10-CM | POA: Diagnosis present

## 2020-01-28 DIAGNOSIS — S3991XA Unspecified injury of abdomen, initial encounter: Secondary | ICD-10-CM | POA: Diagnosis present

## 2020-01-28 DIAGNOSIS — Z9049 Acquired absence of other specified parts of digestive tract: Secondary | ICD-10-CM

## 2020-01-28 DIAGNOSIS — I5032 Chronic diastolic (congestive) heart failure: Secondary | ICD-10-CM | POA: Diagnosis present

## 2020-01-28 DIAGNOSIS — Z66 Do not resuscitate: Secondary | ICD-10-CM | POA: Diagnosis present

## 2020-01-28 DIAGNOSIS — A419 Sepsis, unspecified organism: Secondary | ICD-10-CM | POA: Diagnosis present

## 2020-01-28 DIAGNOSIS — S0083XA Contusion of other part of head, initial encounter: Secondary | ICD-10-CM | POA: Diagnosis present

## 2020-01-28 DIAGNOSIS — F329 Major depressive disorder, single episode, unspecified: Secondary | ICD-10-CM | POA: Diagnosis present

## 2020-01-28 DIAGNOSIS — K838 Other specified diseases of biliary tract: Secondary | ICD-10-CM | POA: Diagnosis not present

## 2020-01-28 DIAGNOSIS — K219 Gastro-esophageal reflux disease without esophagitis: Secondary | ICD-10-CM | POA: Diagnosis present

## 2020-01-28 DIAGNOSIS — R932 Abnormal findings on diagnostic imaging of liver and biliary tract: Secondary | ICD-10-CM | POA: Diagnosis present

## 2020-01-28 DIAGNOSIS — Y92230 Patient room in hospital as the place of occurrence of the external cause: Secondary | ICD-10-CM | POA: Diagnosis not present

## 2020-01-28 DIAGNOSIS — M25512 Pain in left shoulder: Secondary | ICD-10-CM | POA: Diagnosis present

## 2020-01-28 DIAGNOSIS — K8050 Calculus of bile duct without cholangitis or cholecystitis without obstruction: Secondary | ICD-10-CM | POA: Diagnosis present

## 2020-01-28 DIAGNOSIS — W19XXXA Unspecified fall, initial encounter: Secondary | ICD-10-CM | POA: Diagnosis present

## 2020-01-28 DIAGNOSIS — W01198A Fall on same level from slipping, tripping and stumbling with subsequent striking against other object, initial encounter: Secondary | ICD-10-CM | POA: Diagnosis not present

## 2020-01-28 DIAGNOSIS — Z7951 Long term (current) use of inhaled steroids: Secondary | ICD-10-CM

## 2020-01-28 DIAGNOSIS — E1165 Type 2 diabetes mellitus with hyperglycemia: Secondary | ICD-10-CM | POA: Diagnosis present

## 2020-01-28 DIAGNOSIS — Z881 Allergy status to other antibiotic agents status: Secondary | ICD-10-CM

## 2020-01-28 DIAGNOSIS — Y92129 Unspecified place in nursing home as the place of occurrence of the external cause: Secondary | ICD-10-CM

## 2020-01-28 DIAGNOSIS — I714 Abdominal aortic aneurysm, without rupture: Secondary | ICD-10-CM | POA: Diagnosis present

## 2020-01-28 DIAGNOSIS — L89152 Pressure ulcer of sacral region, stage 2: Secondary | ICD-10-CM | POA: Diagnosis present

## 2020-01-28 DIAGNOSIS — Z794 Long term (current) use of insulin: Secondary | ICD-10-CM

## 2020-01-28 DIAGNOSIS — K8689 Other specified diseases of pancreas: Secondary | ICD-10-CM | POA: Diagnosis not present

## 2020-01-28 DIAGNOSIS — E11649 Type 2 diabetes mellitus with hypoglycemia without coma: Secondary | ICD-10-CM | POA: Diagnosis present

## 2020-01-28 DIAGNOSIS — L89100 Pressure ulcer of unspecified part of back, unstageable: Secondary | ICD-10-CM | POA: Diagnosis not present

## 2020-01-28 DIAGNOSIS — Z888 Allergy status to other drugs, medicaments and biological substances status: Secondary | ICD-10-CM

## 2020-01-28 DIAGNOSIS — E611 Iron deficiency: Secondary | ICD-10-CM | POA: Diagnosis present

## 2020-01-28 DIAGNOSIS — I4891 Unspecified atrial fibrillation: Secondary | ICD-10-CM | POA: Insufficient documentation

## 2020-01-28 DIAGNOSIS — L8990 Pressure ulcer of unspecified site, unspecified stage: Secondary | ICD-10-CM | POA: Diagnosis present

## 2020-01-28 DIAGNOSIS — R579 Shock, unspecified: Secondary | ICD-10-CM | POA: Diagnosis present

## 2020-01-28 DIAGNOSIS — E860 Dehydration: Secondary | ICD-10-CM | POA: Diagnosis present

## 2020-01-28 DIAGNOSIS — I110 Hypertensive heart disease with heart failure: Secondary | ICD-10-CM | POA: Diagnosis present

## 2020-01-28 DIAGNOSIS — I482 Chronic atrial fibrillation, unspecified: Secondary | ICD-10-CM | POA: Diagnosis present

## 2020-01-28 DIAGNOSIS — J449 Chronic obstructive pulmonary disease, unspecified: Secondary | ICD-10-CM | POA: Diagnosis present

## 2020-01-28 DIAGNOSIS — Z7901 Long term (current) use of anticoagulants: Secondary | ICD-10-CM

## 2020-01-28 DIAGNOSIS — Z23 Encounter for immunization: Secondary | ICD-10-CM

## 2020-01-28 DIAGNOSIS — Z20822 Contact with and (suspected) exposure to covid-19: Secondary | ICD-10-CM | POA: Diagnosis present

## 2020-01-28 DIAGNOSIS — Z8673 Personal history of transient ischemic attack (TIA), and cerebral infarction without residual deficits: Secondary | ICD-10-CM

## 2020-01-28 DIAGNOSIS — Z79899 Other long term (current) drug therapy: Secondary | ICD-10-CM

## 2020-01-28 DIAGNOSIS — R933 Abnormal findings on diagnostic imaging of other parts of digestive tract: Secondary | ICD-10-CM | POA: Diagnosis not present

## 2020-01-28 DIAGNOSIS — I959 Hypotension, unspecified: Secondary | ICD-10-CM

## 2020-01-28 HISTORY — DX: Unspecified dementia, unspecified severity, without behavioral disturbance, psychotic disturbance, mood disturbance, and anxiety: F03.90

## 2020-01-28 HISTORY — DX: Essential (primary) hypertension: I10

## 2020-01-28 HISTORY — DX: Heart failure, unspecified: I50.9

## 2020-01-28 HISTORY — DX: Iron deficiency: E61.1

## 2020-01-28 HISTORY — DX: Gastro-esophageal reflux disease without esophagitis: K21.9

## 2020-01-28 HISTORY — DX: Hyperlipidemia, unspecified: E78.5

## 2020-01-28 HISTORY — DX: Major depressive disorder, single episode, unspecified: F32.9

## 2020-01-28 LAB — BASIC METABOLIC PANEL
Anion gap: 9 (ref 5–15)
BUN: 35 mg/dL — ABNORMAL HIGH (ref 8–23)
CO2: 19 mmol/L — ABNORMAL LOW (ref 22–32)
Calcium: 8.4 mg/dL — ABNORMAL LOW (ref 8.9–10.3)
Chloride: 109 mmol/L (ref 98–111)
Creatinine, Ser: 1.45 mg/dL — ABNORMAL HIGH (ref 0.61–1.24)
GFR calc Af Amer: 48 mL/min — ABNORMAL LOW (ref >60)
GFR calc non Af Amer: 42 mL/min — ABNORMAL LOW (ref >60)
Glucose, Bld: 238 mg/dL — ABNORMAL HIGH (ref 70–99)
Potassium: 4.5 mmol/L (ref 3.5–5.1)
Sodium: 137 mmol/L (ref 135–145)

## 2020-01-28 LAB — HEPARIN LEVEL (UNFRACTIONATED)
Heparin Unfractionated: 0.87 IU/mL — ABNORMAL HIGH (ref 0.30–0.70)
Heparin Unfractionated: 0.99 IU/mL — ABNORMAL HIGH (ref 0.30–0.70)

## 2020-01-28 LAB — CBC
HCT: 37.3 % — ABNORMAL LOW (ref 39.0–52.0)
HCT: 37.7 % — ABNORMAL LOW (ref 39.0–52.0)
Hemoglobin: 11.9 g/dL — ABNORMAL LOW (ref 13.0–17.0)
Hemoglobin: 11.9 g/dL — ABNORMAL LOW (ref 13.0–17.0)
MCH: 31.2 pg (ref 26.0–34.0)
MCH: 31.3 pg (ref 26.0–34.0)
MCHC: 31.6 g/dL (ref 30.0–36.0)
MCHC: 31.9 g/dL (ref 30.0–36.0)
MCV: 97.6 fL (ref 80.0–100.0)
MCV: 99.2 fL (ref 80.0–100.0)
Platelets: 155 10*3/uL (ref 150–400)
Platelets: 159 10*3/uL (ref 150–400)
RBC: 3.8 MIL/uL — ABNORMAL LOW (ref 4.22–5.81)
RBC: 3.82 MIL/uL — ABNORMAL LOW (ref 4.22–5.81)
RDW: 13.6 % (ref 11.5–15.5)
RDW: 13.8 % (ref 11.5–15.5)
WBC: 12.4 10*3/uL — ABNORMAL HIGH (ref 4.0–10.5)
WBC: 9.2 10*3/uL (ref 4.0–10.5)
nRBC: 0 % (ref 0.0–0.2)
nRBC: 0 % (ref 0.0–0.2)

## 2020-01-28 LAB — I-STAT CHEM 8, ED
BUN: 40 mg/dL — ABNORMAL HIGH (ref 8–23)
Calcium, Ion: 1.19 mmol/L (ref 1.15–1.40)
Chloride: 107 mmol/L (ref 98–111)
Creatinine, Ser: 1.7 mg/dL — ABNORMAL HIGH (ref 0.61–1.24)
Glucose, Bld: 100 mg/dL — ABNORMAL HIGH (ref 70–99)
HCT: 35 % — ABNORMAL LOW (ref 39.0–52.0)
Hemoglobin: 11.9 g/dL — ABNORMAL LOW (ref 13.0–17.0)
Potassium: 4.1 mmol/L (ref 3.5–5.1)
Sodium: 141 mmol/L (ref 135–145)
TCO2: 25 mmol/L (ref 22–32)

## 2020-01-28 LAB — GLUCOSE, CAPILLARY
Glucose-Capillary: 220 mg/dL — ABNORMAL HIGH (ref 70–99)
Glucose-Capillary: 230 mg/dL — ABNORMAL HIGH (ref 70–99)
Glucose-Capillary: 206 mg/dL — ABNORMAL HIGH (ref 70–99)
Glucose-Capillary: 275 mg/dL — ABNORMAL HIGH (ref 70–99)
Glucose-Capillary: 57 mg/dL — ABNORMAL LOW (ref 70–99)
Glucose-Capillary: 50 mg/dL — ABNORMAL LOW (ref 70–99)
Glucose-Capillary: 71 mg/dL (ref 70–99)

## 2020-01-28 LAB — CBG MONITORING, ED
Glucose-Capillary: 110 mg/dL — ABNORMAL HIGH (ref 70–99)
Glucose-Capillary: 115 mg/dL — ABNORMAL HIGH (ref 70–99)
Glucose-Capillary: 182 mg/dL — ABNORMAL HIGH (ref 70–99)
Glucose-Capillary: 197 mg/dL — ABNORMAL HIGH (ref 70–99)
Glucose-Capillary: 46 mg/dL — ABNORMAL LOW (ref 70–99)
Glucose-Capillary: 59 mg/dL — ABNORMAL LOW (ref 70–99)

## 2020-01-28 LAB — COMPREHENSIVE METABOLIC PANEL
ALT: 14 U/L (ref 0–44)
AST: 26 U/L (ref 15–41)
Albumin: 3.6 g/dL (ref 3.5–5.0)
Alkaline Phosphatase: 58 U/L (ref 38–126)
Anion gap: 9 (ref 5–15)
BUN: 41 mg/dL — ABNORMAL HIGH (ref 8–23)
CO2: 23 mmol/L (ref 22–32)
Calcium: 9.3 mg/dL (ref 8.9–10.3)
Chloride: 109 mmol/L (ref 98–111)
Creatinine, Ser: 1.74 mg/dL — ABNORMAL HIGH (ref 0.61–1.24)
GFR calc Af Amer: 39 mL/min — ABNORMAL LOW (ref >60)
GFR calc non Af Amer: 34 mL/min — ABNORMAL LOW (ref >60)
Glucose, Bld: 105 mg/dL — ABNORMAL HIGH (ref 70–99)
Potassium: 4.4 mmol/L (ref 3.5–5.1)
Sodium: 141 mmol/L (ref 135–145)
Total Bilirubin: 0.6 mg/dL (ref 0.3–1.2)
Total Protein: 6.6 g/dL (ref 6.5–8.1)

## 2020-01-28 LAB — PROTIME-INR
INR: 1.1 (ref 0.8–1.2)
Prothrombin Time: 14.3 seconds (ref 11.4–15.2)

## 2020-01-28 LAB — APTT
aPTT: 31 seconds (ref 24–36)
aPTT: 96 seconds — ABNORMAL HIGH (ref 24–36)

## 2020-01-28 LAB — URINALYSIS, ROUTINE W REFLEX MICROSCOPIC
Bilirubin Urine: NEGATIVE
Glucose, UA: NEGATIVE mg/dL
Ketones, ur: NEGATIVE mg/dL
Leukocytes,Ua: NEGATIVE
Nitrite: NEGATIVE
Protein, ur: 30 mg/dL — AB
Specific Gravity, Urine: 1.02 (ref 1.005–1.030)
pH: 5 (ref 5.0–8.0)

## 2020-01-28 LAB — LACTIC ACID, PLASMA
Lactic Acid, Venous: 2.1 mmol/L (ref 0.5–1.9)
Lactic Acid, Venous: 1.2 mmol/L (ref 0.5–1.9)

## 2020-01-28 LAB — ECHOCARDIOGRAM COMPLETE
Height: 69 in
Weight: 2416.24 oz

## 2020-01-28 LAB — SARS CORONAVIRUS 2 (TAT 6-24 HRS): SARS Coronavirus 2: NEGATIVE

## 2020-01-28 LAB — MAGNESIUM: Magnesium: 1.7 mg/dL (ref 1.7–2.4)

## 2020-01-28 LAB — PHOSPHORUS: Phosphorus: 1.7 mg/dL — ABNORMAL LOW (ref 2.5–4.6)

## 2020-01-28 LAB — SAMPLE TO BLOOD BANK

## 2020-01-28 LAB — ETHANOL: Alcohol, Ethyl (B): <10 mg/dL (ref <10)

## 2020-01-28 LAB — BRAIN NATRIURETIC PEPTIDE: B Natriuretic Peptide: 240.4 pg/mL — ABNORMAL HIGH (ref 0.0–100.0)

## 2020-01-28 LAB — MRSA PCR SCREENING: MRSA by PCR: NEGATIVE

## 2020-01-28 MED ORDER — INSULIN ASPART 100 UNIT/ML ~~LOC~~ SOLN: 0.0000 [IU] | Freq: Three times a day (TID) | SUBCUTANEOUS | Status: DC

## 2020-01-28 MED ORDER — UMECLIDINIUM BROMIDE 62.5 MCG/INH IN AEPB
1.0000 | INHALATION_SPRAY | Freq: Every day | RESPIRATORY_TRACT | Status: DC
  Filled 2020-01-28: qty 7

## 2020-01-28 MED ORDER — POLYETHYLENE GLYCOL 3350 17 G PO PACK: 17.0000 g | PACK | Freq: Every day | ORAL | Status: DC | PRN

## 2020-01-28 MED ORDER — ACETAMINOPHEN 325 MG PO TABS: 650.0000 mg | ORAL_TABLET | Freq: Four times a day (QID) | ORAL | Status: DC | PRN

## 2020-01-28 MED ORDER — DEXTROSE 50 % IV SOLN
50.0000 mL | Freq: Once | INTRAVENOUS | Status: AC
  Administered 2020-01-28: 50 mL via INTRAVENOUS

## 2020-01-28 MED ORDER — SODIUM CHLORIDE 0.9 % IV SOLN: INTRAVENOUS | Status: DC | PRN

## 2020-01-28 MED ORDER — DEXTROSE 50 % IV SOLN: 50.0000 mL | Freq: Once | INTRAVENOUS | Status: AC

## 2020-01-28 MED ORDER — POTASSIUM & SODIUM PHOSPHATES 280-160-250 MG PO PACK
2.0000 | PACK | Freq: Three times a day (TID) | ORAL | Status: AC
  Administered 2020-01-28 – 2020-01-29 (×4): 2 via ORAL
  Filled 2020-01-28 (×4): qty 2

## 2020-01-28 MED ORDER — VANCOMYCIN HCL IN DEXTROSE 1-5 GM/200ML-% IV SOLN
1000.0000 mg | Freq: Once | INTRAVENOUS | Status: AC
  Administered 2020-01-28: 1000 mg via INTRAVENOUS
  Filled 2020-01-28: qty 200

## 2020-01-28 MED ORDER — LACTATED RINGERS IV SOLN: INTRAVENOUS | Status: DC

## 2020-01-28 MED ORDER — PERFLUTREN LIPID MICROSPHERE
1.0000 mL | INTRAVENOUS | Status: AC | PRN
  Administered 2020-01-28: 3 mL via INTRAVENOUS
  Filled 2020-01-28: qty 10

## 2020-01-28 MED ORDER — SODIUM CHLORIDE 0.9 % IV BOLUS
1000.0000 mL | Freq: Once | INTRAVENOUS | Status: AC
  Administered 2020-01-28: 1000 mL via INTRAVENOUS

## 2020-01-28 MED ORDER — APIXABAN 5 MG PO TABS: 5.0000 mg | ORAL_TABLET | Freq: Two times a day (BID) | ORAL | Status: DC

## 2020-01-28 MED ORDER — HEPARIN (PORCINE) 25000 UT/250ML-% IV SOLN
900.0000 [IU]/h | INTRAVENOUS | Status: DC
  Administered 2020-01-28: 950 [IU]/h via INTRAVENOUS
  Administered 2020-01-29 – 2020-02-01 (×3): 850 [IU]/h via INTRAVENOUS
  Filled 2020-01-28 (×4): qty 250

## 2020-01-28 MED ORDER — REVEFENACIN 175 MCG/3ML IN SOLN
175.0000 ug | Freq: Every day | RESPIRATORY_TRACT | Status: DC
  Administered 2020-01-28 – 2020-02-01 (×2): 175 ug via RESPIRATORY_TRACT
  Filled 2020-01-28 (×6): qty 3

## 2020-01-28 MED ORDER — TETANUS-DIPHTH-ACELL PERTUSSIS 5-2.5-18.5 LF-MCG/0.5 IM SUSP
0.5000 mL | Freq: Once | INTRAMUSCULAR | Status: AC
  Administered 2020-01-28: 0.5 mL via INTRAMUSCULAR
  Filled 2020-01-28: qty 0.5

## 2020-01-28 MED ORDER — VANCOMYCIN HCL 750 MG/150ML IV SOLN: 750.0000 mg | INTRAVENOUS | Status: DC

## 2020-01-28 MED ORDER — CHLORHEXIDINE GLUCONATE CLOTH 2 % EX PADS: 6.0000 | MEDICATED_PAD | Freq: Every day | CUTANEOUS | Status: DC

## 2020-01-28 MED ORDER — NOREPINEPHRINE 4 MG/250ML-% IV SOLN
0.0000 ug/min | INTRAVENOUS | Status: DC
  Administered 2020-01-28: 2 ug/min via INTRAVENOUS
  Filled 2020-01-28: qty 250

## 2020-01-28 MED ORDER — LACTATED RINGERS IV BOLUS (SEPSIS)
500.0000 mL | Freq: Once | INTRAVENOUS | Status: AC
  Administered 2020-01-28: 500 mL via INTRAVENOUS

## 2020-01-28 MED ORDER — INSULIN ASPART 100 UNIT/ML ~~LOC~~ SOLN
0.0000 [IU] | Freq: Three times a day (TID) | SUBCUTANEOUS | Status: DC
  Administered 2020-01-28: 8 [IU] via SUBCUTANEOUS
  Administered 2020-01-29: 2 [IU] via SUBCUTANEOUS
  Administered 2020-01-29: 3 [IU] via SUBCUTANEOUS
  Administered 2020-01-29 – 2020-02-01 (×6): 2 [IU] via SUBCUTANEOUS
  Administered 2020-02-01: 3 [IU] via SUBCUTANEOUS
  Administered 2020-02-02 (×2): 2 [IU] via SUBCUTANEOUS

## 2020-01-28 MED ORDER — LACTATED RINGERS IV BOLUS (SEPSIS): 1000.0000 mL | Freq: Once | INTRAVENOUS | Status: DC

## 2020-01-28 MED ORDER — DOCUSATE SODIUM 100 MG PO CAPS: 100.0000 mg | ORAL_CAPSULE | Freq: Two times a day (BID) | ORAL | Status: DC | PRN

## 2020-01-28 MED ORDER — DEXTROSE 50 % IV SOLN
1.0000 | Freq: Once | INTRAVENOUS | Status: DC
  Filled 2020-01-28: qty 50

## 2020-01-28 MED ORDER — SODIUM CHLORIDE 0.9 % IV SOLN
2.0000 g | Freq: Once | INTRAVENOUS | Status: AC
  Administered 2020-01-28: 2 g via INTRAVENOUS
  Filled 2020-01-28: qty 2

## 2020-01-28 MED ORDER — HYDROCORTISONE NA SUCCINATE PF 100 MG IJ SOLR
100.0000 mg | Freq: Once | INTRAMUSCULAR | Status: AC
  Administered 2020-01-28: 100 mg via INTRAVENOUS
  Filled 2020-01-28: qty 2

## 2020-01-28 MED ORDER — SODIUM CHLORIDE 0.9 % IV SOLN
INTRAVENOUS | Status: AC | PRN
  Administered 2020-01-28: 1000 mL via INTRAVENOUS

## 2020-01-28 MED ORDER — DEXTROSE 50 % IV SOLN
INTRAVENOUS | Status: AC
  Filled 2020-01-28: qty 50

## 2020-01-28 MED ORDER — ALBUTEROL SULFATE (2.5 MG/3ML) 0.083% IN NEBU: 2.5000 mg | INHALATION_SOLUTION | Freq: Four times a day (QID) | RESPIRATORY_TRACT | Status: DC | PRN

## 2020-01-28 MED ORDER — METRONIDAZOLE IN NACL 5-0.79 MG/ML-% IV SOLN
500.0000 mg | Freq: Once | INTRAVENOUS | Status: AC
  Administered 2020-01-28: 500 mg via INTRAVENOUS
  Filled 2020-01-28: qty 100

## 2020-01-28 MED ORDER — DEXTROSE 10 % IV SOLN: INTRAVENOUS | Status: DC

## 2020-01-28 MED ORDER — CHLORHEXIDINE GLUCONATE CLOTH 2 % EX PADS
6.0000 | MEDICATED_PAD | Freq: Every day | CUTANEOUS | Status: DC
  Administered 2020-01-29 – 2020-02-01 (×4): 6 via TOPICAL

## 2020-01-28 MED ORDER — LACTATED RINGERS IV BOLUS
500.0000 mL | Freq: Once | INTRAVENOUS | Status: AC
  Administered 2020-01-28: 500 mL via INTRAVENOUS

## 2020-01-28 MED ORDER — INSULIN ASPART 100 UNIT/ML ~~LOC~~ SOLN
0.0000 [IU] | Freq: Every day | SUBCUTANEOUS | Status: DC
  Administered 2020-01-30: 2 [IU] via SUBCUTANEOUS

## 2020-01-28 MED ORDER — SODIUM CHLORIDE 0.9 % IV SOLN
2.0000 g | INTRAVENOUS | Status: DC
  Administered 2020-01-28: 2 g via INTRAVENOUS
  Filled 2020-01-28: qty 2

## 2020-01-28 MED ORDER — SODIUM CHLORIDE 0.9 % IV BOLUS: 1000.0000 mL | Freq: Once | INTRAVENOUS | Status: DC

## 2020-01-28 NOTE — ED Triage Notes (Signed)
BIB EMS from [facility]. Unwitnessed fall. Found on floor by staff. Per EMS patient was altered, CBG 39. Given D10, repeat 157. History of dementia, unsure of baseline. Skin tear to bilateral arms and hands. Hematoma to head. VSS.

## 2020-01-28 NOTE — Evaluation (Addendum)
Clinical/Bedside Swallow Evaluation Patient Details  Name: Eric [name] MRN: [id number] Date of Birth: [birth year]  Today's Date: 01/28/2020 Time: SLP Start Time (ACUTE ONLY): 0830 SLP Stop Time (ACUTE ONLY): 0845 SLP Time Calculation (min) (ACUTE ONLY): 15 min  Past Medical History:  Past Medical History:  Diagnosis Date  . Atrial fibrillation ([place])   . CHF (congestive heart failure) ([place])   . COPD (chronic obstructive pulmonary disease) ([place])   . Dementia ([place])   . Diabetes mellitus without complication ([place])   . GERD (gastroesophageal reflux disease)   . Hyperlipemia   . Hypertension   . Iron deficiency   . Major depressive disorder    Past Surgical History: History reviewed. No pertinent surgical history. HPI:  88yM with history of COPD, AF on eliquis, CHF, IDDM who was admitted after unwitnessed fall at nursing home, initial glucose at the time 67. Here found to be hypothermic, hypotensive, hypoglycemic, concern for septic shock unclear source ; pt NPO; BSE generated  Assessment / Plan / Recommendation Clinical Impression  Skilled assessment with intake of thin, puree and regular consistencies without overt s/s of aspiration; pt is edentulous and mastication was slow but thorough during dry, regular consistency with liquid wash given; pt denied swallowing difficulty prior to hospitalization.  Decreased sustained attention and min-mod verbal cues to continue chewing/swallowing without verbalizing to reduce risk for aspiration; ST will f/u x1 for diet tolerance; recommend heart healthy regular consistency with thin liquids; thank you for this consult. SLP Visit Diagnosis: Dysphagia, unspecified (R13.10)    Aspiration Risk  Mild aspiration risk  D/t cognitive deficits only   Diet Recommendation   Heart healthy/thin liquids  Medication Administration: Whole meds with liquid    Other  Recommendations Oral Care Recommendations: Oral care BID   Follow up Recommendations Skilled  Nursing facility      Frequency and Duration min 1 x/week  1 week       Prognosis Prognosis for Safe Diet Advancement: Good Barriers to Reach Goals: Cognitive deficits Barriers/Prognosis Comment: (Pt does well with min-mod verbal cues to maintain attention)      Swallow Study   General Date of Onset: 01/28/20 HPI: 43yM with history of COPD, AF on eliquis, CHF, IDDM who was admitted after unwitnessed fall at nursing home, initial glucose at the time 61. Here found to be hypothermic, hypotensive, hypoglycemic, concern for septic shock unclear source Type of Study: Bedside Swallow Evaluation Previous Swallow Assessment: (n/a) Diet Prior to this Study: NPO Temperature Spikes Noted: No Respiratory Status: Room air History of Recent Intubation: No Behavior/Cognition: Alert;Cooperative;Confused Oral Cavity Assessment: Within Functional Limits Oral Care Completed by SLP: Yes Oral Cavity - Dentition: Edentulous Self-Feeding Abilities: Needs assist Patient Positioning: Upright in bed Baseline Vocal Quality: Normal Volitional Cough: Strong Volitional Swallow: Able to elicit    Oral/Motor/Sensory Function Overall Oral Motor/Sensory Function: Within functional limits   Ice Chips Ice chips: Within functional limits Presentation: Spoon   Thin Liquid Thin Liquid: Within functional limits Presentation: Cup    Nectar Thick Nectar Thick Liquid: Not tested   Honey Thick Honey Thick Liquid: Not tested   Puree Puree: Within functional limits Presentation: Spoon   Solid     Solid: Impaired Presentation: Spoon Oral Phase Impairments: Impaired mastication Oral Phase Functional Implications: Impaired mastication      Elvina Sidle, M.S., CCC-SLP 01/28/2020,9:24 AM

## 2020-01-28 NOTE — Progress Notes (Signed)
  Echocardiogram 2D Echocardiogram has been performed.  Johny Chess 01/28/2020, 12:15 PM

## 2020-01-28 NOTE — Progress Notes (Signed)
[place] for Heparin Indication: atrial fibrillation  Allergies  Allergen Reactions  . Vitamin B12 Swelling and Other (See Comments)    A "gummie" version caused swelling of the legs  . Ampicillin Rash    Patient Measurements: Height: 5\' 9"  (175.3 cm) Weight: 68.5 kg (151 lb 0.2 oz) IBW/kg (Calculated) : 70.7 Heparin Dosing Weight: 68.5 kg  Vital Signs: Temp: 98.5 F (36.9 C) (04/10 2001) Temp Source: Oral (04/10 2001) BP: 92/62 (04/10 1930) Pulse Rate: 86 (04/10 1930)  Labs: Recent Labs    01/28/20 0031 01/28/20 0031 01/28/20 0037 01/28/20 0705 01/28/20 1931  HGB 11.9*   < > 11.9* 11.9*  --   HCT 37.7*  --  35.0* 37.3*  --   PLT 155  --   --  159  --   APTT  --   --   --  31 96*  LABPROT 14.3  --   --   --   --   INR 1.1  --   --   --   --   HEPARINUNFRC  --   --   --  0.87* 0.99*  CREATININE 1.74*  --  1.70* 1.45*  --    < > = values in this interval not displayed.    Estimated Creatinine Clearance: 32.2 mL/min (A) (by C-G formula based on SCr of 1.45 mg/dL (H)).   Medical History: Past Medical History:  Diagnosis Date  . Atrial fibrillation ([place])   . CHF (congestive heart failure) ([place])   . COPD (chronic obstructive pulmonary disease) ([place])   . Dementia ([place])   . Diabetes mellitus without complication ([place])   . GERD (gastroesophageal reflux disease)   . Hyperlipemia   . Hypertension   . Iron deficiency   . Major depressive disorder     Medications:  Scheduled:  . [START ON 01/29/2020] Chlorhexidine Gluconate Cloth  6 each Topical Q0600  . dextrose  1 ampule Intravenous Once  . insulin aspart  0-15 Units Subcutaneous TID WC  . insulin aspart  0-5 Units Subcutaneous QHS  . potassium & sodium phosphates  2 packet Oral TID WC & HS  . revefenacin  175 mcg Nebulization Daily    Assessment: 86 year old on Eliquis prior to admission for atrial fibrillation (last dose 4/9 at 2000) now with concern for possible  choledocholithiasis. Pharmacy consulted to transition to IV Heparin while work-up ongoing.   No overt bleeding from any orifices. INR 1.1 baseline.   Heparin level is falsely elevated at 0.99, aPTT is therapeutic at 96, on 950 units/hr. No infusion issues. Patient has multiple skin tears oozing and extensive bruising - stable from earlier.   Goal of Therapy:  aPTT: 66-102 sec Heparin level 0.3-0.7 units/ml Monitor platelets by anticoagulation protocol: Yes   Plan:   Reduce heparin infusion to 900 units/hr given on higher end of goal range and has extensive skin tears that are oozing APTT and Heparin level in 8 hours.  Daily aPTT, Heparin level and CBC while on therapy.   Antonietta Jewel, PharmD, [place] Clinical Pharmacist  Phone: [phone] 01/28/2020 9:09 PM  Please check AMION for all [place] phone numbers After 10:00 PM, call [place] [phone]

## 2020-01-28 NOTE — Progress Notes (Signed)
PCCM:  Please see H&P from this AM  Follow up on CT ABD/Pelvis   IMPRESSION: 1. While the patient is post cholecystectomy, there are 2 discrete filling defects within the CBD worrisome for choledocholithiasis with dominant suspected stone measuring 1.2 cm in diameter with suspected upstream intrahepatic biliary ductal dilatation, incompletely evaluated on this noncontrast examination. Further evaluation could be performed with MRCP or ERCP as indicated.  I will call and discuss with GI colleagues.   Garner Nash, DO [facility] 01/28/2020 10:44 AM

## 2020-01-28 NOTE — Progress Notes (Signed)
ANTICOAGULATION CONSULT NOTE - Initial Consult  Pharmacy Consult for Heparin Indication: atrial fibrillation  Allergies  Allergen Reactions  . Vitamin B12 Swelling and Other (See Comments)    A "gummie" version caused swelling of the legs  . Ampicillin Rash    Patient Measurements: Height: 5\' 9"  (175.3 cm) Weight: 68.5 kg (151 lb 0.2 oz) IBW/kg (Calculated) : 70.7 Heparin Dosing Weight: 68.5 kg  Vital Signs: Temp: 98 F (36.7 C) (04/10 0800) Temp Source: Oral (04/10 0800) BP: 110/77 (04/10 0830) Pulse Rate: 94 (04/10 0830)  Labs: Recent Labs    01/28/20 0031 01/28/20 0031 01/28/20 0037 01/28/20 0705  HGB 11.9*   < > 11.9* 11.9*  HCT 37.7*  --  35.0* 37.3*  PLT 155  --   --  159  LABPROT 14.3  --   --   --   INR 1.1  --   --   --   CREATININE 1.74*  --  1.70* 1.45*   < > = values in this interval not displayed.    Estimated Creatinine Clearance: 32.2 mL/min (A) (by C-G formula based on SCr of 1.45 mg/dL (H)).   Medical History: Past Medical History:  Diagnosis Date  . Atrial fibrillation ([place])   . CHF (congestive heart failure) ([place])   . COPD (chronic obstructive pulmonary disease) ([place])   . Dementia ([place])   . Diabetes mellitus without complication ([place])   . GERD (gastroesophageal reflux disease)   . Hyperlipemia   . Hypertension   . Iron deficiency   . Major depressive disorder     Medications:  Scheduled:  . dextrose  1 ampule Intravenous Once  . umeclidinium bromide  1 puff Inhalation Daily    Assessment: 86 year old on Eliquis prior to admission for atrial fibrillation (last dose 4/9 at 2000) now with concern for possible choledocholithiasis. Pharmacy consulted to transition to IV Heparin while work-up ongoing.   Patient has multiple skin tears oozing and extensive bruising.  No overt bleeding from any orifices.  INR 1.1 baseline.  Baseline aPTT and Heparin level pending.  Will utilize aPTT for now as Heparin level falsely elevated in the  setting of recent Eliquis.   Goal of Therapy:  Heparin level 0.3-0.7 units/ml Monitor platelets by anticoagulation protocol: Yes   Plan:  Will obtain baseline Heparin level and aPTT.  Start IV Heparin at 950 units/hr. No bolus.  APTT and Heparin level in 8 hours.  Daily aPTT, Heparin level and CBC while on therapy.   Sloan Leiter, PharmD, BCPS, BCCCP Clinical Pharmacist Please refer to [facility] for [place] numbers 01/28/2020,10:50 AM

## 2020-01-28 NOTE — H&P (Addendum)
NAME:  Eric [name], MRN:  [id number], DOB:  1927-02-25, LOS: 0 ADMISSION DATE:  01/28/2020, CONSULTATION DATE:  01/28/20 REFERRING MD: [place] , CHIEF COMPLAINT:  fall   Brief History   86 year old male presenting from assisted living who was found on the ground by staff after unwitnessed falls.  He was altered and hypoglycemic on EMS arrival and found to meet sepsis criteria ED. He remained hypotensive despite 30cc/kg IV fluids so PCCM consulted for admission.   History of present illness   86 year old male with a past medical history significant for atrial fibrillation on Eliquis, heart failure, COPD, dementia, type 2 diabetes, hyperlipidemia, hypertension who resides at a nursing facility and had an unwitnessed fall.  Found on the ground by staff awake but confused.  He had initial glucose of 39 and was altered.  In the emergency department, he was initially hypotensive to 73/49.  He was treated with D50 and 2.5 L of IV fluids along with Vanco, flagyl and cefepime.  Work-up was significant for lactic acid of 2.1, mild  leukocytosis urinalysis 12.4, UA positive for rare bacteria and hemoglobin.  CT head and cervical spine had no acute findings and chest x-ray without infiltrate.  CT abdomen/pelvis is pending; peripheral Levophed was started and PCCM consulted for admission   Past Medical History   has a past medical history of Atrial fibrillation ([place]), CHF (congestive heart failure) ([place]), COPD (chronic obstructive pulmonary disease) ([place]), Dementia ([place]), Diabetes mellitus without complication ([place]), GERD (gastroesophageal reflux disease), Hyperlipemia, Hypertension, Iron deficiency, and Major depressive disorder.   Significant Hospital Events   01/28/20-admit to PCCM   Consults:    Procedures:    Significant Diagnostic Tests:  01/28/20 CT head>> chronic right occipital lobe infarct with generalized cerebral atrophy, no acute findings 01/28/20 CT C-spine>> no fractures, marked  severe degenerative changes    Micro Data:  01/28/20 blood cultures x2.>> 01/28/20 urine culture>> 01/28/20 SARS-Cov-2>>   Antimicrobials:  Vancomycin 4/10- Cefepime 4/10- Flagyl 4/10  Interim history/subjective:  Blood pressure stable on peripheral Levophed  Objective   Blood pressure 129/68, pulse (!) 59, temperature (!) 96.3 F (35.7 C), temperature source Rectal, resp. rate (!) 22, height 5\' 10"  (1.778 m), weight 81.6 kg, SpO2 95 %.        Intake/Output Summary (Last 24 hours) at 01/28/2020 0527 Last data filed at 01/28/2020 [id number] Gross per 24 hour  Intake 1100 ml  Output --  Net 1100 ml   Filed Weights   01/28/20 0012  Weight: 81.6 kg    General: Elderly, chronically ill-appearing male in no acute distress HEENT: MM pink/moist Neuro: Awake, conversational, oriented to person confused to situation, follows commands and moving all extremities, cranial nerves II through XII grossly intact, pupils equal and reactive to light CV: s1s2 RRR, no m/r/g PULM: Clear to auscultation bilaterally GI: soft, bsx4 active  Extremities: warm/dry, no edema  Skin: no rashes or multiple skin tears bilateral upper extremity  Resolved Hospital Problem list     Assessment & Plan:   Severe sepsis -Source unclear currently, CT scan abdomen pelvis is pending -Initial lactic acid 2.1 -Continued hypotension despite 2.5 L fluids in the ED P: -Continue broad-spectrum antibiotics with vancomycin and cefepime -Follow CT abd/pelvis results -Blood cultures are pending -Trend lactic acid -Continue peripheral Levophed to maintain MAP >60-65 -Received 100mg  hydrocortisone in the ED as well, as he is improving will hold off further stress dose steroids  Altered mental status and unwitnessed fall -Baseline dementia, encephalopathy has improved  with correction of hypoglycemia -CT head with old CVA but no acute findings P: -Likely secondary to hypoglycemia and sepsis attempted to reach  daughter to discuss what his baseline is, but was unable to reach her -c/o L shoulder pain, check xray -Encourage delirium precautions -speech eval   Renal insufficiency, possibly acute -Creatinine 1.7, no records in epic or care everywhere to establish a baseline P: -Monitor urine output, electrolytes and repeat renal function after fluid resuscitation and avoid nephrotoxins -hold Ace inhibitors   Diabetes -Hold Lantus, SSI and monitor for continued hypoglycemia   Atrial fibrillation, hypertension, heart failure -in atrial fibrillation with HR 80's P: -continue Eliquis -hold Lisinopril  -Obtain echocardiogram and baseline BNP  History of COPD -no wheezing on exam and no PFT's on record P: -continue home Spiriva with prn albuterol nebs  Best practice:  Diet: npo until speech eval Pain/Anxiety/Delirium protocol (if indicated): Tylenol VAP protocol (if indicated): n/a DVT prophylaxis: Eliquis GI prophylaxis: n/a Glucose control: SSI Mobility: bed rest Code Status: DNR Family Communication: attempted to call patient's daughter but no answer Disposition: ICU  Labs   CBC: Recent Labs  Lab 01/28/20 0031 01/28/20 0037  WBC 12.4*  --   HGB 11.9* 11.9*  HCT 37.7* 35.0*  MCV 99.2  --   PLT 155  --     Basic Metabolic Panel: Recent Labs  Lab 01/28/20 0031 01/28/20 0037  NA 141 141  K 4.4 4.1  CL 109 107  CO2 23  --   GLUCOSE 105* 100*  BUN 41* 40*  CREATININE 1.74* 1.70*  CALCIUM 9.3  --    GFR: Estimated Creatinine Clearance: 29.2 mL/min (A) (by C-G formula based on SCr of 1.7 mg/dL (H)). Recent Labs  Lab 01/28/20 0031 01/28/20 0144  WBC 12.4*  --   LATICACIDVEN  --  2.1*    Liver Function Tests: Recent Labs  Lab 01/28/20 0031  AST 26  ALT 14  ALKPHOS 58  BILITOT 0.6  PROT 6.6  ALBUMIN 3.6   No results for input(s): LIPASE, AMYLASE in the last 168 hours. No results for input(s): AMMONIA in the last 168 hours.  ABG    Component Value  Date/Time   TCO2 25 01/28/2020 0037     Coagulation Profile: Recent Labs  Lab 01/28/20 0031  INR 1.1    Cardiac Enzymes: No results for input(s): CKTOTAL, CKMB, CKMBINDEX, TROPONINI in the last 168 hours.  HbA1C: No results found for: HGBA1C  CBG: Recent Labs  Lab 01/28/20 0104 01/28/20 0139 01/28/20 0303 01/28/20 0350 01/28/20 0438  GLUCAP 59* 110* 46* 197* 182*    Review of Systems:   Negative except as noted in HPI  Past Medical History  He,  has a past medical history of Atrial fibrillation ([place]), CHF (congestive heart failure) ([place]), COPD (chronic obstructive pulmonary disease) ([place]), Dementia ([place]), Diabetes mellitus without complication ([place]), GERD (gastroesophageal reflux disease), Hyperlipemia, Hypertension, Iron deficiency, and Major depressive disorder.   Surgical History   History reviewed. No pertinent surgical history.   Social History   reports that he has never smoked. He has never used smokeless tobacco. He reports previous alcohol use. He reports previous drug use.   Family History   His family history is not on file.   Allergies Allergies  Allergen Reactions  . Vitamin B12 Swelling and Other (See Comments)    A "gummie" version caused swelling of the legs  . Ampicillin Rash     Home Medications  Prior to Admission  medications   Medication Sig Start Date End Date Taking? Authorizing Provider  acetaminophen (TYLENOL) 325 MG tablet Take 650 mg by mouth 3 (three) times daily.   Yes [provider]  apixaban (ELIQUIS) 2.5 MG TABS tablet Take 5 mg by mouth 2 (two) times daily.   Yes [provider]  Ensure (ENSURE) Take 120 mLs by mouth with breakfast, with lunch, and with evening meal.   Yes [provider]  FLUoxetine (PROZAC) 20 MG capsule Take 20 mg by mouth daily.   Yes [provider]  insulin glargine (LANTUS SOLOSTAR) 100 UNIT/ML Solostar Pen Inject 24 Units into the skin at bedtime.   Yes [provider]  lisinopril (ZESTRIL) 5 MG tablet Take 5 mg by mouth See admin instructions. Take 5 mg by mouth at bedtime and hold for a Systolic B/P less than [id number] or Diastolic B/P of less than 60   Yes [provider]  Multiple Vitamins-Minerals (SENIOR MULTIVITAMIN PLUS PO) Take 1 tablet by mouth daily with breakfast.   Yes [provider]  ondansetron (ZOFRAN) 4 MG tablet Take 4 mg by mouth every 6 (six) hours as needed for nausea or vomiting.   Yes [provider]  potassium chloride (KLOR-CON) 20 MEQ packet Take 20 mEq by mouth daily.   Yes [provider]  sennosides-docusate sodium (SENOKOT-S) 8.6-50 MG tablet Take 1 tablet by mouth See admin instructions. Take 1 tablet by mouth at bedtime and hold for loose or liquid stools   Yes [provider]  simethicone (MYLICON) 80 MG chewable tablet Chew 80 mg by mouth 4 (four) times daily -  before meals and at bedtime.   Yes [provider]  tiotropium (SPIRIVA) 18 MCG inhalation capsule Place 18 mcg into inhaler and inhale daily.   Yes [provider]     Critical care time: 58 minutes       CRITICAL CARE Performed by: Otilio Carpen Tyreak Reagle   Total critical care time: 58 minutes  Critical care time was exclusive of separately billable procedures and treating other patients.  Critical care was necessary to treat or prevent imminent or life-threatening deterioration.  Critical care was time spent personally by me on the following activities: development of treatment plan with patient and/or surrogate as well as nursing, discussions with consultants, evaluation of patient's response to treatment, examination of patient, obtaining history from patient or surrogate, ordering and performing treatments and interventions, ordering and review of laboratory studies, ordering and review of radiographic studies, pulse oximetry and re-evaluation of patient's condition.   Otilio Carpen Graison Leinberger,  PA-C

## 2020-01-28 NOTE — ED Notes (Signed)
Dr Wyvonnia Dusky aware BP 99991111 systolic, CBG 57.

## 2020-01-28 NOTE — Progress Notes (Signed)
Pharmacy Antibiotic Note  Eric [name] is a 86 y.o. male admitted on 01/28/2020 with sepsis.  Pharmacy has been consulted for Vancomycin/Cefepime dosing. WBC mildly elevated. Noted renal dysfunction.   Plan: Vancomycin 750 mg IV q24h >>Estimated AUC: 449 Cefepime 2g q24h Trend WBC, temp, renal function  F/U infectious work-up Drug levels as indicated   Height: 5\' 10"  (177.8 cm) Weight: 81.6 kg (180 lb) IBW/kg (Calculated) : 73  Temp (24hrs), Avg:96 F (35.6 C), Min:95.7 F (35.4 C), Max:96.3 F (35.7 C)  Recent Labs  Lab 01/28/20 0031 01/28/20 0037 01/28/20 0144  WBC 12.4*  --   --   CREATININE 1.74* 1.70*  --   LATICACIDVEN  --   --  2.1*    Estimated Creatinine Clearance: 29.2 mL/min (A) (by C-G formula based on SCr of 1.7 mg/dL (H)).    Allergies  Allergen Reactions  . Vitamin B12 Swelling and Other (See Comments)    A "gummie" version caused swelling of the legs  . Ampicillin Rash   Narda Bonds, PharmD, BCPS Clinical Pharmacist Phone: [phone]

## 2020-01-28 NOTE — ED Notes (Signed)
Critical care PA at bedside.  

## 2020-01-28 NOTE — ED Notes (Signed)
PA Gleason made aware of ongoing L shoulder pain, no xrays taken of shoulder initially.

## 2020-01-28 NOTE — Progress Notes (Signed)
eLink Physician-Brief Progress Note Patient Name: [name] DOB: May 10, 1927 MRN: [id number]   Date of Service  01/28/2020  HPI/Events of Note  Patient admitted with septic shock, hypothermia and hypoglycemia.  eICU Interventions  New Patient Evaluation completed.        Kerry Kass Cassady Turano 01/28/2020, 7:07 AM

## 2020-01-28 NOTE — Progress Notes (Signed)
  Notified bedside nurse of need to draw lactic acid and fluid bolus.  Called bedside to make aware repeat lactic needed to be drawn before the next 30 mins. Also I noticed that the last liter of NS was charted held per MD. Asked why it wasn't given due to pt meeting requirments for full fluid replacement. Bedside RN was unsure as to why ED rn held the fluid, suggested maybe because of risk for CHF

## 2020-01-28 NOTE — ED Provider Notes (Signed)
[facility] EMERGENCY DEPARTMENT Provider Note   CSN: [id number] Arrival date & time: 01/28/20  0008     History Chief Complaint  Patient presents with  . Fall    Eric [name] is a 86 y.o. male.  Level 5 caveat for dementia.  Patient brought in by EMS from nursing facility after unwitnessed fall.  He was found on the ground by staff.  Has a history of dementia and unclear baseline.  EMS reported he was "going in and out of it".  A CBG was 39.  He was given D10 and sugar improved to 157.  He has a history of atrial fibrillation on Eliquis, CHF, diabetes.  He was found to have a hematoma to his head and multiple skin tears to his bilateral arms and hands.  He is unable to give a history.  He complains of pain all over.  He is oriented to person only.  He denies any chest pain or abdominal pain.  Complains of pain to his neck, head, bilateral arms and hands.  The history is provided by the patient and the EMS personnel. The history is limited by the condition of the patient.  Fall       No past medical history on file.  There are no problems to display for this patient.   he histories are not reviewed yet. Please review them in the "History" navigator section and refresh this [place].     No family history on file.  Social History   Tobacco Use  . Smoking status: Not on file  Substance Use Topics  . Alcohol use: Not on file  . Drug use: Not on file    Home Medications Prior to Admission medications   Not on File    Allergies    Ampicillin  Review of Systems   Review of Systems  Unable to perform ROS: Dementia    Physical Exam Updated Vital Signs BP 106/60 (BP Location: Right Arm)   Pulse 84   Temp (!) 95.7 F (35.4 C) (Rectal)   Resp (!) 22   Ht 5\' 10"  (1.778 m)   Wt 81.6 kg   SpO2 97%   BMI 25.83 kg/m   Physical Exam Vitals and nursing note reviewed.  Constitutional:      General: He is in acute distress.     Appearance:  He is well-developed.     Comments: Uncomfortable  HENT:     Head: Atraumatic.     Comments: Abrasion hematoma to right forehead    Mouth/Throat:     Pharynx: No oropharyngeal exudate.  Eyes:     Conjunctiva/sclera: Conjunctivae normal.     Pupils: Pupils are equal, round, and reactive to light.  Neck:     Comments: C-collar in place, diffuse paraspinal tenderness, no midline tenderness Cardiovascular:     Rate and Rhythm: Normal rate. Rhythm irregular.     Heart sounds: Normal heart sounds. No murmur.  Pulmonary:     Effort: Pulmonary effort is normal. No respiratory distress.     Breath sounds: Normal breath sounds.  Chest:     Chest wall: No tenderness.  Abdominal:     Palpations: Abdomen is soft.     Tenderness: There is no abdominal tenderness. There is no guarding or rebound.  Musculoskeletal:        General: No tenderness. Normal range of motion.     Cervical back: Normal range of motion and neck supple.     Comments: Pelvis  stable  Multiple skin tears of bilateral dorsal hands and elbows.  No bony tenderness.  Moving all extremities equally.  Able to flex and extend hips without difficulty  Skin:    General: Skin is warm.  Neurological:     Mental Status: He is alert.     Cranial Nerves: No cranial nerve deficit.     Motor: No abnormal muscle tone.     Coordination: Coordination normal.     Comments: Oriented to person only.  No facial droop, moves all extremities, follows commands  Psychiatric:        Behavior: Behavior normal.     ED Results / Procedures / Treatments   Labs (all labs ordered are listed, but only abnormal results are displayed) Labs Reviewed  COMPREHENSIVE METABOLIC PANEL - Abnormal; Notable for the following components:      Result Value   Glucose, Bld 105 (*)    BUN 41 (*)    Creatinine, Ser 1.74 (*)    GFR calc non Af Amer 34 (*)    GFR calc Af Amer 39 (*)    All other components within normal limits  CBC - Abnormal; Notable for the  following components:   WBC 12.4 (*)    RBC 3.80 (*)    Hemoglobin 11.9 (*)    HCT 37.7 (*)    All other components within normal limits  URINALYSIS, ROUTINE W REFLEX MICROSCOPIC - Abnormal; Notable for the following components:   APPearance HAZY (*)    Hgb urine dipstick SMALL (*)    Protein, ur 30 (*)    Bacteria, UA RARE (*)    All other components within normal limits  LACTIC ACID, PLASMA - Abnormal; Notable for the following components:   Lactic Acid, Venous 2.1 (*)    All other components within normal limits  BASIC METABOLIC PANEL - Abnormal; Notable for the following components:   CO2 19 (*)    Glucose, Bld 238 (*)    BUN 35 (*)    Creatinine, Ser 1.45 (*)    Calcium 8.4 (*)    GFR calc non Af Amer 42 (*)    GFR calc Af Amer 48 (*)    All other components within normal limits  PHOSPHORUS - Abnormal; Notable for the following components:   Phosphorus 1.7 (*)    All other components within normal limits  GLUCOSE, CAPILLARY - Abnormal; Notable for the following components:   Glucose-Capillary 220 (*)    All other components within normal limits  CBC - Abnormal; Notable for the following components:   RBC 3.82 (*)    Hemoglobin 11.9 (*)    HCT 37.3 (*)    All other components within normal limits  GLUCOSE, CAPILLARY - Abnormal; Notable for the following components:   Glucose-Capillary 230 (*)    All other components within normal limits  I-STAT CHEM 8, ED - Abnormal; Notable for the following components:   BUN 40 (*)    Creatinine, Ser 1.70 (*)    Glucose, Bld 100 (*)    Hemoglobin 11.9 (*)    HCT 35.0 (*)    All other components within normal limits  CBG MONITORING, ED - Abnormal; Notable for the following components:   Glucose-Capillary 115 (*)    All other components within normal limits  CBG MONITORING, ED - Abnormal; Notable for the following components:   Glucose-Capillary 59 (*)    All other components within normal limits  CBG MONITORING, ED - Abnormal;  Notable  for the following components:   Glucose-Capillary 110 (*)    All other components within normal limits  CBG MONITORING, ED - Abnormal; Notable for the following components:   Glucose-Capillary 46 (*)    All other components within normal limits  CBG MONITORING, ED - Abnormal; Notable for the following components:   Glucose-Capillary 197 (*)    All other components within normal limits  CBG MONITORING, ED - Abnormal; Notable for the following components:   Glucose-Capillary 182 (*)    All other components within normal limits  SARS CORONAVIRUS 2 (TAT 6-24 HRS)  MRSA PCR SCREENING  CULTURE, BLOOD (ROUTINE X 2)  CULTURE, BLOOD (ROUTINE X 2)  URINE CULTURE  ETHANOL  PROTIME-INR  LACTIC ACID, PLASMA  MAGNESIUM  BRAIN NATRIURETIC PEPTIDE  HEMOGLOBIN A1C  SAMPLE TO BLOOD BANK    EKG EKG Interpretation  Date/Time:  Saturday January 28 2020 00:32:09 EDT Ventricular Rate:  82 PR Interval:    QRS Duration: 112 QT Interval:  422 QTC Calculation: 493 R Axis:   -71 Text Interpretation: Atrial fibrillation LAD, consider left anterior fascicular block Abnormal R-wave progression, late transition Borderline prolonged QT interval No previous ECGs available Confirmed by Ezequiel Essex [phone]) on 01/28/2020 12:39:10 AM   Radiology DG Elbow 2 Views Left  Result Date: 01/28/2020 CLINICAL DATA:  Fall EXAM: LEFT ELBOW - 2 VIEW COMPARISON:  None. FINDINGS: No fracture or malalignment.  No significant elbow effusion. IMPRESSION: No acute osseous abnormality Electronically Signed   By: Donavan Foil M.D.   On: 01/28/2020 01:45   DG Elbow 2 Views Right  Result Date: 01/28/2020 CLINICAL DATA:  Fall with skin tear EXAM: RIGHT ELBOW - 2 VIEW COMPARISON:  None. FINDINGS: No fracture or malalignment.  No definitive elbow effusion IMPRESSION: No acute osseous abnormality Electronically Signed   By: Donavan Foil M.D.   On: 01/28/2020 01:45   CT Head Wo Contrast  Result Date:  01/28/2020 CLINICAL DATA:  Status post fall. EXAM: CT HEAD WITHOUT CONTRAST TECHNIQUE: Contiguous axial images were obtained from the base of the skull through the vertex without intravenous contrast. COMPARISON:  October 25, 2017 FINDINGS: Brain: There is mild to moderate severity cerebral atrophy with widening of the extra-axial spaces and ventricular dilatation. There are areas of decreased attenuation within the white matter tracts of the supratentorial brain, consistent with microvascular disease changes. A small area of cortical encephalomalacia, with adjacent chronic white matter low attenuation is seen within the right occipital lobe. Vascular: No hyperdense vessel or unexpected calcification. Skull: Normal. Negative for fracture or focal lesion. Sinuses/Orbits: No acute finding. Other: Mild right frontal scalp soft tissue swelling is seen. IMPRESSION: 1. Generalized cerebral atrophy. 2. Chronic right occipital lobe infarct. 3. No acute intracranial abnormality. Electronically Signed   By: Virgina Norfolk M.D.   On: 01/28/2020 01:28   CT Cervical Spine Wo Contrast  Result Date: 01/28/2020 CLINICAL DATA:  Post fall. EXAM: CT HEAD WITHOUT CONTRAST CT CERVICAL SPINE WITHOUT CONTRAST TECHNIQUE: Multidetector CT imaging of the head and cervical spine was performed following the standard protocol without intravenous contrast. Multiplanar CT image reconstructions of the cervical spine were also generated. COMPARISON:  January 02, 2018 FINDINGS: CT HEAD FINDINGS Brain: There is mild cerebral atrophy with widening of the extra-axial spaces and ventricular dilatation. There are areas of decreased attenuation within the white matter tracts of the supratentorial brain, consistent with microvascular disease changes. A small area of cortical encephalomalacia, with adjacent chronic white matter low attenuation, is seen  within the right occipital lobe. Vascular: No hyperdense vessel or unexpected calcification. Skull:  Normal. Negative for fracture or focal lesion. Sinuses/Orbits: No acute finding. Other: None. CT CERVICAL SPINE FINDINGS Alignment: There is reversal of the normal cervical lordosis. Approximately 2 mm anterolisthesis of the C2 vertebral body is seen on C3. Skull base and vertebrae: No acute fracture. Marked severity degenerative changes seen along the anterior atlantoaxial joint without primary bone lesion or focal pathologic process. Soft tissues and spinal canal: No prevertebral fluid or swelling. No visible canal hematoma. Disc levels: Marked severity endplate sclerosis is seen at the levels of C4-C5 and C5-C6. Mild to moderate severity endplate sclerosis is noted at the level of C6-C7. Marked severity intervertebral disc space narrowing is seen at the levels of C4-C5 and C5-C6. Moderate severity multilevel bilateral facet joint hypertrophy is seen. Upper chest: Multiple sternal wires are present. Other: N/A IMPRESSION: 1. No acute intracranial abnormality. 2. Mild cerebral atrophy with widening of the extra-axial spaces and ventricular dilatation. 3. Small, old right occipital lobe infarct. 4. Marked severity degenerative changes of the cervical spine, most prominent at the levels of C4-C5 and C5-C6. 5. Approximately 2 mm anterolisthesis of the C2 vertebral body on C3. Electronically Signed   By: Virgina Norfolk M.D.   On: 01/28/2020 01:24   DG Pelvis Portable  Result Date: 01/28/2020 CLINICAL DATA:  Fall EXAM: PORTABLE PELVIS 1-2 VIEWS COMPARISON:  06/30/2014 FINDINGS: SI joints are non widened. Pubic symphysis and rami are intact. Status post left hip replacement with intact hardware and normal alignment. No acute displaced fracture or dislocation. Vascular calcifications IMPRESSION: Left hip replacement.  No definite acute osseous abnormality Electronically Signed   By: Donavan Foil M.D.   On: 01/28/2020 01:43   DG Hand 2 View Right  Result Date: 01/28/2020 CLINICAL DATA:  Fall skin tear EXAM:  RIGHT HAND - 2 VIEW COMPARISON:  None. FINDINGS: Mild motion degradation. Bones appear osteopenic. There is no fracture or subluxation. Intercarpal degenerative change. Joint space narrowing at the MCP and IP joints. IMPRESSION: No acute osseous abnormality Electronically Signed   By: Donavan Foil M.D.   On: 01/28/2020 01:44   DG Hand 2 View Left  Result Date: 01/28/2020 CLINICAL DATA:  Fall EXAM: LEFT HAND - 2 VIEW COMPARISON:  None. FINDINGS: No fracture or malalignment. Mild motion degradation. Degenerative changes at the IP joints. Degenerative changes at the STT interval and first [facility] joint. Cartilaginous calcifications. IMPRESSION: No acute osseous abnormality Electronically Signed   By: Donavan Foil M.D.   On: 01/28/2020 01:46   DG Chest Port 1 View  Result Date: 01/28/2020 CLINICAL DATA:  Level 2 trauma fall EXAM: PORTABLE CHEST 1 VIEW COMPARISON:  01/02/2018 FINDINGS: Post sternotomy changes. No focal opacity or pleural effusion. Stable cardiomediastinal silhouette with borderline cardiomegaly. Aortic atherosclerosis. No pneumothorax. IMPRESSION: Stable.  No significant interval change compared with prior. Electronically Signed   By: Donavan Foil M.D.   On: 01/28/2020 01:42   DG Shoulder Left  Result Date: 01/28/2020 CLINICAL DATA:  Fall 2 weeks ago with left shoulder pain. EXAM: LEFT SHOULDER - 2+ VIEW COMPARISON:  None. FINDINGS: Moderate degenerative changes of the [facility] joint and mild degenerate change of the glenohumeral joint. No acute fracture or dislocation. Subtle focus of increased density in the soft tissues just superior to the greater tuberosity which could represent a subtle chip fracture versus calcification over the region of the supraspinatus tendon. Degenerative changes of the spine. IMPRESSION: 1. Subtle density in  the soft tissues just superior to the greater tuberosity which may represent calcification over the region of the supraspinatus tendon and less likely acute rib  fracture. 2. Moderate degenerative changes of the [facility] joint mild degenerate change of the glenohumeral joint. Electronically Signed   By: Marin Olp M.D.   On: 01/28/2020 08:31    Procedures .Critical Care Performed by: Ezequiel Essex, MD Authorized by: Ezequiel Essex, MD   Critical care provider statement:    Critical care time (minutes):  60   Critical care was necessary to treat or prevent imminent or life-threatening deterioration of the following conditions:  Shock, sepsis and trauma   Critical care was time spent personally by me on the following activities:  Discussions with consultants, evaluation of patient's response to treatment, examination of patient, ordering and performing treatments and interventions, ordering and review of laboratory studies, ordering and review of radiographic studies, pulse oximetry, re-evaluation of patient's condition, obtaining history from patient or surrogate and review of old charts Ultrasound ED FAST  Date/Time: 01/28/2020 9:15 AM Performed by: Ezequiel Essex, MD Authorized by: Ezequiel Essex, MD  Procedure details:    Indications: blunt abdominal trauma      Assess for:  Intra-abdominal fluid and pericardial effusion    Technique:  Abdominal and cardiac    Images: archived      Abdominal findings:    L kidney:  Visualized   R kidney:  Visualized   Liver:  Visualized    Bladder:  Visualized, Foley catheter not visualized   Hepatorenal space visualized: identified     Splenorenal space: identified     Rectovesical free fluid: not identified     Splenorenal free fluid: not identified     Hepatorenal space free fluid: not identified   Cardiac findings:    Heart:  Visualized   Wall motion: identified     Pericardial effusion: not identified     (including critical care time)  Medications Ordered in ED Medications  Tdap (BOOSTRIX) injection 0.5 mL (has no administration in time range)  sodium chloride 0.9 % bolus 1,000 mL  (has no administration in time range)    ED Course  I have reviewed the triage vital signs and the nursing notes.  Pertinent labs & imaging results that were available during my care of the patient were reviewed by me and considered in my medical decision making (see chart for details).    MDM Rules/Calculators/A&P                     Dementia patient with unwitnessed fall found to be hypoglycemic which is improved.  GCS is 14.  ABCs are intact.  Is following commands.  CT head, C spine. Extremity Xrays, update tetanus.   Downtrending BPs to 80s and worsening hypoglycemia Patient with persistent hypoglycemia as well as hypotension in the ED.  Blood pressure did respond to fluids.  Bedside ultrasound shows no free fluid, pericardial effusion or obvious AAA.  Do not suspect hypotension due to fall but anticipate dehydration with possible sepsis. Lactate is normal.   Patient given broad-spectrum antibiotics and IV fluids per sepsis protocol.  He is hypothermic and hypoglycemic and hypotensive.  He is complaining of pain all over. Medication list does not include chronic steroids. Consider adrenal insufficiency or hypothyroidism and stress dose steroids given.  No obvious source of infection in urine or chest x-ray.  He is maintaining his mental status.  His blood pressure remains low in the 80s and  0000000 systolic after 30 milliliters per kilogram fluid resuscitation.  Patient does have DNR paperwork in place.  Does have a history of aortic stenosis as well as CHF with unknown ejection fraction. Attempted to contact emergency contact for patient without success.  Admission discussed with Dr. Marlowe Sax of  the hospitalist service.  With the persistent hypotension she is recommending ICU admission and will initiate vasopressors.  Peripheral levophed begun with persistent hypotension after fluid resuscitation. Unclear whether he has sepsis but is treated as such. d10 infusion for persistent  hypoglycemia. bair hugger for hypothermia.  ICU admission d/w Dr. Lucile Shutters and PCCM team. No answer atmultiple calls to emergency contact.  CT abdomen pelvis pending at time of admission. Final Clinical Impression(s) / ED Diagnoses Final diagnoses:  Fall  Fall, initial encounter  Hypoglycemia  Hypotension, unspecified hypotension type    Rx / DC Orders ED Discharge Orders    None       Kiffany Schelling, Annie Main, MD 01/28/20 [phone]

## 2020-01-28 NOTE — ED Notes (Signed)
cbg 182

## 2020-01-28 NOTE — ED Notes (Signed)
3 skin tears noted to pt's left arm/forearm/wrist. Wounds irrigated with saline and dressed with non-adherent dressing and tegaderm.

## 2020-01-28 NOTE — Consult Note (Signed)
Consultation  Referring Provider: Dr Verlee Monte Primary Care Physician:  Gelene Mink, FNP Primary Gastroenterologist:  none  Reason for Consultation: Sepsis of unclear source, choledocholithiasis on CT  HPI: Eric [name] is a 86 y.o. male, who was admitted from nursing home yesterday after a fall.  Glucose at that time found to be 39 and he was sent to the emergency room.  Found to be hypothermic, hypotensive with concerns for septic shock.  He was started on IV cefepime/Vanco and Flagyl. Initial labs with WBC of 12.4, hemoglobin 11.9 INR 1.1, lactate 2.1 and normal LFTs. Work-up with negative chest x-ray and urine, CT of the abdomen and pelvis without contrast showed bibasilar atelectasis, there are 2 filling defects noted in the common bile duct 1 measuring 1.2 x 1.0 cm, evidence for chronic calcific pancreatitis and large stool burden, he has diverticulosis without diverticulitis. Upper abdominal ultrasound now shows intrahepatic and extrahepatic ductal dilation with CBD of 13.3 patient is status post cholecystectomy  Critical care concerned about possible biliary source for sepsis.  Today, WBC down to 9.2, hemoglobin 11.9 2D echo done and pending  Patient is alert and talkative this afternoon, pleasantly a bit confused and only knows that he is here because he fell.  He has absolutely no complaints of abdominal discomfort or any recent abdominal discomfort, or nausea. He had remote cholecystectomy. He says he feels fine except his left shoulder is bothering him  History is pertinent for history of atrial fibrillation for which she is on Eliquis, COPD, congestive heart failure, insulin-dependent diabetes mellitus.   Past Medical History:  Diagnosis Date  . Atrial fibrillation ([place])   . CHF (congestive heart failure) ([place])   . COPD (chronic obstructive pulmonary disease) ([place])   . Dementia ([place])   . Diabetes mellitus without complication ([place])   . GERD (gastroesophageal  reflux disease)   . Hyperlipemia   . Hypertension   . Iron deficiency   . Major depressive disorder     History reviewed. No pertinent surgical history.  Prior to Admission medications   Medication Sig Start Date End Date Taking? Authorizing Provider  acetaminophen (TYLENOL) 325 MG tablet Take 650 mg by mouth 3 (three) times daily.   Yes [provider]  apixaban (ELIQUIS) 2.5 MG TABS tablet Take 5 mg by mouth 2 (two) times daily.   Yes [provider]  Ensure (ENSURE) Take 120 mLs by mouth with breakfast, with lunch, and with evening meal.   Yes [provider]  FLUoxetine (PROZAC) 20 MG capsule Take 20 mg by mouth daily.   Yes [provider]  insulin glargine (LANTUS SOLOSTAR) 100 UNIT/ML Solostar Pen Inject 24 Units into the skin at bedtime.   Yes [provider]  lisinopril (ZESTRIL) 5 MG tablet Take 5 mg by mouth See admin instructions. Take 5 mg by mouth at bedtime and hold for a Systolic B/P less than [id number] or Diastolic B/P of less than 60   Yes [provider]  Multiple Vitamins-Minerals (SENIOR MULTIVITAMIN PLUS PO) Take 1 tablet by mouth daily with breakfast.   Yes [provider]  ondansetron (ZOFRAN) 4 MG tablet Take 4 mg by mouth every 6 (six) hours as needed for nausea or vomiting.   Yes [provider]  potassium chloride (KLOR-CON) 20 MEQ packet Take 20 mEq by mouth daily.   Yes [provider]  sennosides-docusate sodium (SENOKOT-S) 8.6-50 MG tablet Take 1 tablet by mouth See admin instructions. Take 1 tablet by  mouth at bedtime and hold for loose or liquid stools   Yes [provider]  simethicone (MYLICON) 80 MG chewable tablet Chew 80 mg by mouth 4 (four) times daily -  before meals and at bedtime.   Yes [provider]  tiotropium (SPIRIVA) 18 MCG inhalation capsule Place 18 mcg into inhaler and inhale daily.   Yes [provider]    Current Facility-Administered  Medications  Medication Dose Route Frequency Provider Last Rate Last Admin  . acetaminophen (TYLENOL) tablet 650 mg  650 mg Oral Q6H PRN Gleason, Otilio Carpen, PA-C      . albuterol (PROVENTIL) (2.5 MG/3ML) 0.083% nebulizer solution 2.5 mg  2.5 mg Nebulization Q6H PRN Gleason, Otilio Carpen, PA-C      . ceFEPIme (MAXIPIME) 2 g in sodium chloride 0.9 % 100 mL IVPB  2 g Intravenous Q24H Gleason, Otilio Carpen, PA-C      . [START ON 01/29/2020] Chlorhexidine Gluconate Cloth 2 % PADS 6 each  6 each Topical Q0600 Maryjane Hurter, MD      . dextrose 50 % solution 50 mL  1 ampule Intravenous Once Gleason, Otilio Carpen, PA-C      . docusate sodium (COLACE) capsule 100 mg  100 mg Oral BID PRN Gleason, Otilio Carpen, PA-C      . heparin ADULT infusion 100 units/mL (25000 units/268mL sodium chloride 0.45%)  950 Units/hr Intravenous Continuous Sloan Leiter B, RPH 9.5 mL/hr at 01/28/20 1500 950 Units/hr at 01/28/20 1500  . lactated ringers infusion   Intravenous Continuous Gleason, Otilio Carpen, PA-C 75 mL/hr at 01/28/20 1500 Rate Verify at 01/28/20 1500  . norepinephrine (LEVOPHED) 4mg  in 261mL premix infusion  0-40 mcg/min Intravenous Continuous Gleason, Otilio Carpen, PA-C 7.5 mL/hr at 01/28/20 1500 2 mcg/min at 01/28/20 1500  . polyethylene glycol (MIRALAX / GLYCOLAX) packet 17 g  17 g Oral Daily PRN Gleason, Otilio Carpen, PA-C      . potassium & sodium phosphates (PHOS-NAK) 280-160-250 MG packet 2 packet  2 packet Oral TID WC & HS Icard, Bradley L, DO      . revefenacin (YUPELRI) nebulizer solution 175 mcg  175 mcg Nebulization Daily Icard, Bradley L, DO   175 mcg at 01/28/20 1210  . sodium chloride 0.9 % bolus 1,000 mL  1,000 mL Intravenous Once Gleason, Otilio Carpen, PA-C   Stopped at 01/28/20 0405    Allergies as of 01/27/2020  . (Not on File)    No family history on file.  Social History   Socioeconomic History  . Marital status: Widowed    Spouse name: Not on file  . Number of children: Not on file  . Years of education: Not on file    . Highest education level: Not on file  Occupational History  . Not on file  Tobacco Use  . Smoking status: Never Smoker  . Smokeless tobacco: Never Used  Substance and Sexual Activity  . Alcohol use: Not Currently  . Drug use: Not Currently  . Sexual activity: Not on file  Other Topics Concern  . Not on file  Social History Narrative  . Not on file   Social Determinants of Health   Financial Resource Strain:   . Difficulty of Paying Living Expenses:   Food Insecurity:   . Worried About Charity fundraiser in the Last Year:   . Arboriculturist in the Last Year:   Transportation Needs:   . Film/video editor (Medical):   Marland Kitchen Lack  of Transportation (Non-Medical):   Physical Activity:   . Days of Exercise per Week:   . Minutes of Exercise per Session:   Stress:   . Feeling of Stress :   Social Connections:   . Frequency of Communication with Friends and Family:   . Frequency of Social Gatherings with Friends and Family:   . Attends Religious Services:   . Active Member of Clubs or Organizations:   . Attends Archivist Meetings:   Marland Kitchen Marital Status:   Intimate Partner Violence:   . Fear of Current or Ex-Partner:   . Emotionally Abused:   Marland Kitchen Physically Abused:   . Sexually Abused:     Review of Systems: Pertinent positive and negative review of systems were noted in the above HPI section.  All other review of systems was otherwise negative.  Physical Exam: Vital signs in last 24 hours: Temp:  [95.7 F (35.4 C)-98.5 F (36.9 C)] 97.9 F (36.6 C) (04/10 1200) Pulse Rate:  [59-109] 71 (04/10 1500) Resp:  [13-30] 19 (04/10 1500) BP: (73-136)/(41-115) 96/60 (04/10 1500) SpO2:  [94 %-100 %] 99 % (04/10 1500) Weight:  [68.5 kg-81.6 kg] 68.5 kg (04/10 0659) Last BM Date: 01/27/20 General:   Alert,  Well-developed, well-nourished, thin very elderly white male pleasant and cooperative in NAD Head:  Normocephalic and atraumatic. Eyes:  Sclera clear, no  icterus.   Conjunctiva pink. Ears:  Normal auditory acuity. Nose:  No deformity, discharge,  or lesions. Mouth:  No deformity or lesions.   Neck:  Supple; no masses or thyromegaly. Lungs:  Clear throughout to auscultation.   No wheezes, crackles, or rhonchi. Heart:  irRegular rate and rhythm; no murmurs, clicks, rubs,  or gallops. Abdomen:  Soft,nontender, BS active,nonpalp mass or hsm.,  Cholecystectomy scar Rectal:  Deferred  Msk:  Symmetrical without gross deformities. . Pulses:  Normal pulses noted. Extremities:  Without clubbing or edema. Neurologic:  Alert and  oriented x4;  grossly normal neurologically. Skin: Thin skin with multiple scattered ecchymoses Psych:  Alert and cooperative. Normal mood and affect.  Intake/Output from previous day: 04/09 0701 - 04/10 0700 In: 3903.2 [I.V.:3803.2; IV Piggyback:100] Out: -  Intake/Output this shift: Total I/O In: 778.7 [I.V.:265.3; IV Piggyback:513.4] Out: 250 [Urine:250]  Lab Results: Recent Labs    01/28/20 0031 01/28/20 0037 01/28/20 0705  WBC 12.4*  --  9.2  HGB 11.9* 11.9* 11.9*  HCT 37.7* 35.0* 37.3*  PLT 155  --  159   BMET Recent Labs    01/28/20 0031 01/28/20 0037 01/28/20 0705  NA 141 141 137  K 4.4 4.1 4.5  CL 109 107 109  CO2 23  --  19*  GLUCOSE 105* 100* 238*  BUN 41* 40* 35*  CREATININE 1.74* 1.70* 1.45*  CALCIUM 9.3  --  8.4*   LFT Recent Labs    01/28/20 0031  PROT 6.6  ALBUMIN 3.6  AST 26  ALT 14  ALKPHOS 58  BILITOT 0.6   PT/INR Recent Labs    01/28/20 0031  LABPROT 14.3  INR 1.1   Hepatitis Panel No results for input(s): HEPBSAG, HCVAB, HEPAIGM, HEPBIGM in the last 72 hours.    IMPRESSION:  #58 86 year old white male admitted with suspected septic shock after a fall at nursing home. Source of sepsis not clear.  He is currently hemodynamically stable, and without complaints. CT and ultrasound imaging today show dilated CBD to 13 mm in patient status post cholecystectomy,  and 2 filling defects in the  common bile duct, largest measuring 1.2 x 1 cm consistent with choledocholithiasis  Patient has no complaints of abdominal pain or nausea and abdomen is quite benign. LFTs are normal.  It is not clear that the common bile duct stones are because of his current sepsis picture, and suspect they are chronic  #2 history of atrial fibrillation on chronic Eliquis #3 congestive heart failure 4.  COPD 5.  Insulin-dependent diabetes mellitus  Plan; serial LFTs Await blood cultures Continue broad-spectrum antibiotics are as per critical care Hold Eliquis, and event procedures indicated Will discuss role for MRCP, not sure will add much to CT findings. He may eventually need ERCP, as no other source for sepsis found and if blood cultures positive. We will follow with you     Jahson Emanuele PA-C  01/28/2020, 3:58 PM

## 2020-01-29 ENCOUNTER — Inpatient Hospital Stay (HOSPITAL_COMMUNITY): Payer: Medicare Other

## 2020-01-29 DIAGNOSIS — K8050 Calculus of bile duct without cholangitis or cholecystitis without obstruction: Secondary | ICD-10-CM | POA: Diagnosis present

## 2020-01-29 DIAGNOSIS — A419 Sepsis, unspecified organism: Principal | ICD-10-CM

## 2020-01-29 LAB — GLUCOSE, CAPILLARY
Glucose-Capillary: 122 mg/dL — ABNORMAL HIGH (ref 70–99)
Glucose-Capillary: 127 mg/dL — ABNORMAL HIGH (ref 70–99)
Glucose-Capillary: 127 mg/dL — ABNORMAL HIGH (ref 70–99)
Glucose-Capillary: 137 mg/dL — ABNORMAL HIGH (ref 70–99)
Glucose-Capillary: 184 mg/dL — ABNORMAL HIGH (ref 70–99)

## 2020-01-29 LAB — COMPREHENSIVE METABOLIC PANEL
ALT: 13 U/L (ref 0–44)
AST: 24 U/L (ref 15–41)
Albumin: 2.9 g/dL — ABNORMAL LOW (ref 3.5–5.0)
Alkaline Phosphatase: 49 U/L (ref 38–126)
Anion gap: 7 (ref 5–15)
BUN: 28 mg/dL — ABNORMAL HIGH (ref 8–23)
CO2: 22 mmol/L (ref 22–32)
Calcium: 8.2 mg/dL — ABNORMAL LOW (ref 8.9–10.3)
Chloride: 109 mmol/L (ref 98–111)
Creatinine, Ser: 1.43 mg/dL — ABNORMAL HIGH (ref 0.61–1.24)
GFR calc Af Amer: 49 mL/min — ABNORMAL LOW (ref >60)
GFR calc non Af Amer: 43 mL/min — ABNORMAL LOW (ref >60)
Glucose, Bld: 160 mg/dL — ABNORMAL HIGH (ref 70–99)
Potassium: 4.4 mmol/L (ref 3.5–5.1)
Sodium: 138 mmol/L (ref 135–145)
Total Bilirubin: 0.6 mg/dL (ref 0.3–1.2)
Total Protein: 5.5 g/dL — ABNORMAL LOW (ref 6.5–8.1)

## 2020-01-29 LAB — HEPARIN LEVEL (UNFRACTIONATED): Heparin Unfractionated: 0.75 IU/mL — ABNORMAL HIGH (ref 0.30–0.70)

## 2020-01-29 LAB — PHOSPHORUS: Phosphorus: 2.4 mg/dL — ABNORMAL LOW (ref 2.5–4.6)

## 2020-01-29 LAB — APTT: aPTT: 96 seconds — ABNORMAL HIGH (ref 24–36)

## 2020-01-29 LAB — MAGNESIUM: Magnesium: 1.5 mg/dL — ABNORMAL LOW (ref 1.7–2.4)

## 2020-01-29 MED ORDER — SODIUM CHLORIDE 0.9 % IV SOLN
2.0000 g | Freq: Two times a day (BID) | INTRAVENOUS | Status: AC
  Administered 2020-01-29 – 2020-02-01 (×8): 2 g via INTRAVENOUS
  Filled 2020-01-29 (×8): qty 2

## 2020-01-29 MED ORDER — SODIUM CHLORIDE 0.9 % IV SOLN: 2.0000 g | Freq: Two times a day (BID) | INTRAVENOUS | Status: DC

## 2020-01-29 NOTE — Progress Notes (Addendum)
[facility] NOTE   Pharmacy Consult for Heparin; Cefepime Indication: atrial fibrillation; Sepsis  Allergies  Allergen Reactions  . Vitamin B12 Swelling and Other (See Comments)    A "gummie" version caused swelling of the legs  . Ampicillin Rash    Patient Measurements: Height: 5\' 9"  (175.3 cm) Weight: 69.4 kg (153 lb) IBW/kg (Calculated) : 70.7 Heparin Dosing Weight: 68.5 kg  Vital Signs: Temp: 97.8 F (36.6 C) (04/11 0700) Temp Source: Oral (04/11 0700) BP: 110/66 (04/11 0800) Pulse Rate: 86 (04/11 0800)  Labs: Recent Labs    01/28/20 0031 01/28/20 0031 01/28/20 0037 01/28/20 0705 01/28/20 1931 01/29/20 0424  HGB 11.9*   < > 11.9* 11.9*  --   --   HCT 37.7*  --  35.0* 37.3*  --   --   PLT 155  --   --  159  --   --   APTT  --   --   --  31 96* 96*  LABPROT 14.3  --   --   --   --   --   INR 1.1  --   --   --   --   --   HEPARINUNFRC  --   --   --  0.87* 0.99* 0.75*  CREATININE 1.74*   < > 1.70* 1.45*  --  1.43*   < > = values in this interval not displayed.    Estimated Creatinine Clearance: 33 mL/min (A) (by C-G formula based on SCr of 1.43 mg/dL (H)).   Medical History: Past Medical History:  Diagnosis Date  . Atrial fibrillation ([place])   . CHF (congestive heart failure) ([place])   . COPD (chronic obstructive pulmonary disease) ([place])   . Dementia ([place])   . Diabetes mellitus without complication ([place])   . GERD (gastroesophageal reflux disease)   . Hyperlipemia   . Hypertension   . Iron deficiency   . Major depressive disorder     Medications:  Scheduled:  . Chlorhexidine Gluconate Cloth  6 each Topical Q0600  . dextrose  1 ampule Intravenous Once  . insulin aspart  0-15 Units Subcutaneous TID WC  . insulin aspart  0-5 Units Subcutaneous QHS  . potassium & sodium phosphates  2 packet Oral TID WC & HS  . revefenacin  175 mcg Nebulization Daily    Assessment: 86 year old on Eliquis prior to admission for atrial  fibrillation (last dose 4/9 at 2000) now with concern for possible choledocholithiasis. Pharmacy consulted to transition to IV Heparin while work-up ongoing. No overt bleeding from any orifices. INR 1.1 baseline.   Heparin level is falsely elevated at 0.75. aPTT remains therapeutic at 96 on 950 units/hr. Heparin infusion was paused for 1.5hr to go for MRI of Abdomen per RN but has been resumed and level was prior to infusion being paused. Patient has multiple skin tears oozing and extensive bruising - still with oozing on dressings today.   MRI Abdomen: consistent with choledocholithiasis; large distal stone.  ID: Day #2 of Cefepime for sepsis - possible choledocholithiasis. WBC is within normal limits. LA is down to 1.2. MRSA PCR negative. Afebrile. SCr improving with good UOP.   Goal of Therapy:  aPTT: 66-102 sec Heparin level 0.3-0.7 units/ml Monitor platelets by anticoagulation protocol: Yes   Plan:   Reduce heparin infusion to 850 units/hr given on higher end of goal range and has extensive skin tears that are oozing.  Daily aPTT, Heparin level and CBC while on  therapy.  Follow-up GI plans and if need to hold IV Heparin for any possible procedure  Increase Cefepime to 2g IV every 12 hours for improved renal function.  Monitor cultures, clinical status, and renal function.  Follow-up length of therapy.  Sloan Leiter, PharmD, BCPS, BCCCP Clinical Pharmacist Please refer to [facility] for [place] numbers 01/29/2020 10:03 AM

## 2020-01-29 NOTE — Progress Notes (Signed)
Progress Note   Subjective  Patient feeling well, no pain. No complains at this time.    Objective   Vital signs in last 24 hours: Temp:  [97.8 F (36.6 C)-98.9 F (37.2 C)] 98.9 F (37.2 C) (04/11 1200) Pulse Rate:  [58-137] 80 (04/11 1000) Resp:  [15-30] 23 (04/11 1200) BP: (76-150)/(35-115) 129/69 (04/11 1200) SpO2:  [95 %-100 %] 97 % (04/11 1200) Weight:  [69.4 kg] 69.4 kg (04/11 0600) Last BM Date: 01/29/20 General:    white male in NAD Abdomen:  Soft, nontender and nondistended.  Extremities:  Without edema. Psych:  Cooperative. Normal mood and affect.  Intake/Output from previous day: 04/10 0701 - 04/11 0700 In: 2725.4 [P.O.:480; I.V.:1631.9; IV Piggyback:613.5] Out: 1025 [Urine:1025] Intake/Output this shift: Total I/O In: 615.9 [I.V.:515.8; IV Piggyback:100.1] Out: -   Lab Results: Recent Labs    01/28/20 0031 01/28/20 0037 01/28/20 0705  WBC 12.4*  --  9.2  HGB 11.9* 11.9* 11.9*  HCT 37.7* 35.0* 37.3*  PLT 155  --  159   BMET Recent Labs    01/28/20 0031 01/28/20 0031 01/28/20 0037 01/28/20 0705 01/29/20 0424  NA 141   < > 141 137 138  K 4.4   < > 4.1 4.5 4.4  CL 109   < > 107 109 109  CO2 23  --   --  19* 22  GLUCOSE 105*   < > 100* 238* 160*  BUN 41*   < > 40* 35* 28*  CREATININE 1.74*   < > 1.70* 1.45* 1.43*  CALCIUM 9.3  --   --  8.4* 8.2*   < > = values in this interval not displayed.   LFT Recent Labs    01/29/20 0424  PROT 5.5*  ALBUMIN 2.9*  AST 24  ALT 13  ALKPHOS 49  BILITOT 0.6   PT/INR Recent Labs    01/28/20 0031  LABPROT 14.3  INR 1.1    Studies/Results: CT ABDOMEN PELVIS WO CONTRAST  Result Date: 01/28/2020 CLINICAL DATA:  Non localized fall, now with abdominal pain and hypotension. Patient is currently on anticoagulation for atrial fibrillation. EXAM: CT ABDOMEN AND PELVIS WITHOUT CONTRAST TECHNIQUE: Multidetector CT imaging of the abdomen and pelvis was performed following the standard protocol  without IV contrast. COMPARISON:  CT abdomen and pelvis-01/30/2016 FINDINGS: The lack of intravenous contrast limits the ability to evaluate solid abdominal organs. Lower chest: Limited visualization of the lower thorax demonstrates minimal bibasilar subsegmental atelectasis as well as an approximately 2.1 x 0.9 cm cyst within the right costophrenic angle (image 6, series 5). No discrete focal airspace opacities. Cardiomegaly. Exuberant calcifications about the mitral valve annulus. Suspected sequela of previous aortic valve repair and median sternotomy, incompletely evaluated. No pericardial effusion. Hepatobiliary: Mild atrophy of the left lobe of the liver. While the patient is post cholecystectomy, there are at least 2 discrete filling defects within the CBD worrisome for choledocholithiasis with dominant opacity measuring approximately 1.2 x 1.0 cm (axial image 25, series 4; coronal image 55, series 8). This finding Marlou Sa is associated with suspected upstream intrahepatic bili duct dilatation this is suboptimally evaluated on this noncontrast examination. No ascites. Pancreas: Scattered dystrophic calcifications within the pancreas could represent the sequela of chronic calcific pancreatitis. Currently there is no superimposed peripancreatic stranding. Spleen: Normal noncontrast appearance of the spleen. Adrenals/Urinary Tract: Normal noncontrast appearance of the bilateral kidneys for age. No renal stones. No renal stones are seen along the expected course of  either ureter or the urinary bladder. No urinary obstruction or perinephric stranding. Normal noncontrast appearance the bilateral adrenal glands. Normal appearance of the urinary bladder given degree distention. Stomach/Bowel: Large colonic stool burden without evidence of enteric obstruction. Scattered colonic diverticulosis without evidence of super imposed acute diverticulitis on this noncontrast examination. While there is a moderate amount of liquid  stool within the cecum and ascending colon there are no discrete areas of bowel wall thickening on this noncontrast examination. Normal noncontrast appearance of the terminal ileum and appendix. No pneumoperitoneum, pneumatosis or portal venous gas. Vascular/Lymphatic: Mild aneurysmal dilatation of the abdominal aorta measuring 3.3 x 3.2 x 3.4 cm and maximal oblique short axis axial (image 41, series 4), coronal (image 43, series 8 and sagittal (image 92, series 7), dimensions respectively previously, 3.3 x 3.3 x 3.4 cm when compared to the 01/2016 examination. Large amount of slightly irregular calcified atherosclerotic plaque throughout the abdominal aorta. No definitive intramural hematoma. No bulky retroperitoneal, mesenteric, pelvic or inguinal lymphadenopathy on this noncontrast examination. Reproductive: The prostate is borderline enlarged measuring 5.3 x 4.5 x 3.8 cm (axial image 77, series 4; sagittal image 89, series 7) with minimal mass effect on the undersurface of the urinary bladder. No free fluid the pelvic cul-de-sac. Other: Subcutaneous stranding about the caudal aspect of the ventral abdominal pannus likely represents the location of subcutaneous medication administration. Minimal amount of subcutaneous edema about the midline of the low back. Musculoskeletal: No acute or aggressive osseous abnormalities. Mild-to-moderate multilevel lumbar spine DDD, worse at L4-L5 with disc space height loss, endplate irregularity and sclerosis. Post left total hip replacement, the caudal aspect of which is not imaged though without evidence of hardware failure loosening. Moderate degenerative change the right hip with joint space loss, subchondral sclerosis and osteophytosis. IMPRESSION: 1. While the patient is post cholecystectomy, there are 2 discrete filling defects within the CBD worrisome for choledocholithiasis with dominant suspected stone measuring 1.2 cm in diameter with suspected upstream intrahepatic  biliary ductal dilatation, incompletely evaluated on this noncontrast examination. Further evaluation could be performed with MRCP or ERCP as indicated. 2. Otherwise, no explanation for patient's acute abdominal pain and hypotension. Specifically, no evidence of urinary / enteric obstruction or retroperitoneal hematoma. 3. Large colonic stool burden without evidence of enteric obstruction. 4. Colonic diverticulosis without evidence superimposed acute diverticulitis on this noncontrast examination. 5. Unchanged infrarenal abdominal aortic aneurysm measuring 3.4 cm in maximal diameter, stable since the 01/2016 examination. Abdominal Aortic Aneurysm (ICD10-I71.9). Recommend follow-up aortic ultrasound in 3 years. This recommendation follows ACR consensus guidelines: White Paper of the ACR Incidental Findings Committee II on Vascular Findings. J Am Coll Radiol 2013; 10:789-794. 6. Borderline prostatomegaly. Electronically Signed   By: Sandi Mariscal M.D.   On: 01/28/2020 09:58   DG Elbow 2 Views Left  Result Date: 01/28/2020 CLINICAL DATA:  Fall EXAM: LEFT ELBOW - 2 VIEW COMPARISON:  None. FINDINGS: No fracture or malalignment.  No significant elbow effusion. IMPRESSION: No acute osseous abnormality Electronically Signed   By: Donavan Foil M.D.   On: 01/28/2020 01:45   DG Elbow 2 Views Right  Result Date: 01/28/2020 CLINICAL DATA:  Fall with skin tear EXAM: RIGHT ELBOW - 2 VIEW COMPARISON:  None. FINDINGS: No fracture or malalignment.  No definitive elbow effusion IMPRESSION: No acute osseous abnormality Electronically Signed   By: Donavan Foil M.D.   On: 01/28/2020 01:45   CT Head Wo Contrast  Result Date: 01/28/2020 CLINICAL DATA:  Status post fall. EXAM: CT  HEAD WITHOUT CONTRAST TECHNIQUE: Contiguous axial images were obtained from the base of the skull through the vertex without intravenous contrast. COMPARISON:  October 25, 2017 FINDINGS: Brain: There is mild to moderate severity cerebral atrophy with  widening of the extra-axial spaces and ventricular dilatation. There are areas of decreased attenuation within the white matter tracts of the supratentorial brain, consistent with microvascular disease changes. A small area of cortical encephalomalacia, with adjacent chronic white matter low attenuation is seen within the right occipital lobe. Vascular: No hyperdense vessel or unexpected calcification. Skull: Normal. Negative for fracture or focal lesion. Sinuses/Orbits: No acute finding. Other: Mild right frontal scalp soft tissue swelling is seen. IMPRESSION: 1. Generalized cerebral atrophy. 2. Chronic right occipital lobe infarct. 3. No acute intracranial abnormality. Electronically Signed   By: Virgina Norfolk M.D.   On: 01/28/2020 01:28   CT Cervical Spine Wo Contrast  Result Date: 01/28/2020 CLINICAL DATA:  Post fall. EXAM: CT HEAD WITHOUT CONTRAST CT CERVICAL SPINE WITHOUT CONTRAST TECHNIQUE: Multidetector CT imaging of the head and cervical spine was performed following the standard protocol without intravenous contrast. Multiplanar CT image reconstructions of the cervical spine were also generated. COMPARISON:  January 02, 2018 FINDINGS: CT HEAD FINDINGS Brain: There is mild cerebral atrophy with widening of the extra-axial spaces and ventricular dilatation. There are areas of decreased attenuation within the white matter tracts of the supratentorial brain, consistent with microvascular disease changes. A small area of cortical encephalomalacia, with adjacent chronic white matter low attenuation, is seen within the right occipital lobe. Vascular: No hyperdense vessel or unexpected calcification. Skull: Normal. Negative for fracture or focal lesion. Sinuses/Orbits: No acute finding. Other: None. CT CERVICAL SPINE FINDINGS Alignment: There is reversal of the normal cervical lordosis. Approximately 2 mm anterolisthesis of the C2 vertebral body is seen on C3. Skull base and vertebrae: No acute fracture.  Marked severity degenerative changes seen along the anterior atlantoaxial joint without primary bone lesion or focal pathologic process. Soft tissues and spinal canal: No prevertebral fluid or swelling. No visible canal hematoma. Disc levels: Marked severity endplate sclerosis is seen at the levels of C4-C5 and C5-C6. Mild to moderate severity endplate sclerosis is noted at the level of C6-C7. Marked severity intervertebral disc space narrowing is seen at the levels of C4-C5 and C5-C6. Moderate severity multilevel bilateral facet joint hypertrophy is seen. Upper chest: Multiple sternal wires are present. Other: N/A IMPRESSION: 1. No acute intracranial abnormality. 2. Mild cerebral atrophy with widening of the extra-axial spaces and ventricular dilatation. 3. Small, old right occipital lobe infarct. 4. Marked severity degenerative changes of the cervical spine, most prominent at the levels of C4-C5 and C5-C6. 5. Approximately 2 mm anterolisthesis of the C2 vertebral body on C3. Electronically Signed   By: Virgina Norfolk M.D.   On: 01/28/2020 01:24   DG Pelvis Portable  Result Date: 01/28/2020 CLINICAL DATA:  Fall EXAM: PORTABLE PELVIS 1-2 VIEWS COMPARISON:  06/30/2014 FINDINGS: SI joints are non widened. Pubic symphysis and rami are intact. Status post left hip replacement with intact hardware and normal alignment. No acute displaced fracture or dislocation. Vascular calcifications IMPRESSION: Left hip replacement.  No definite acute osseous abnormality Electronically Signed   By: Donavan Foil M.D.   On: 01/28/2020 01:43   DG Hand 2 View Right  Result Date: 01/28/2020 CLINICAL DATA:  Fall skin tear EXAM: RIGHT HAND - 2 VIEW COMPARISON:  None. FINDINGS: Mild motion degradation. Bones appear osteopenic. There is no fracture or subluxation. Intercarpal degenerative  change. Joint space narrowing at the MCP and IP joints. IMPRESSION: No acute osseous abnormality Electronically Signed   By: Donavan Foil M.D.    On: 01/28/2020 01:44   DG Hand 2 View Left  Result Date: 01/28/2020 CLINICAL DATA:  Fall EXAM: LEFT HAND - 2 VIEW COMPARISON:  None. FINDINGS: No fracture or malalignment. Mild motion degradation. Degenerative changes at the IP joints. Degenerative changes at the STT interval and first [facility] joint. Cartilaginous calcifications. IMPRESSION: No acute osseous abnormality Electronically Signed   By: Donavan Foil M.D.   On: 01/28/2020 01:46   MR ABDOMEN MRCP WO CONTRAST  Result Date: 01/29/2020 CLINICAL DATA:  Choledocholithiasis suspected on CT exam. EXAM: MRI ABDOMEN WITHOUT CONTRAST  (INCLUDING MRCP) TECHNIQUE: Multiplanar multisequence MR imaging of the abdomen was performed. Heavily T2-weighted images of the biliary and pancreatic ducts were obtained, and three-dimensional MRCP images were rendered by post processing. COMPARISON:  CT 01/28/2020, ultrasound 01/28/2019 FINDINGS: Lower chest:  Lung bases are clear. Hepatobiliary: Mild intrahepatic biliary duct dilatation. Moderate extrahepatic biliary duct dilatation. Common hepatic duct measures 10 mm. The common bile duct measures 10 mm. Within the distal common bile duct there is a large filling defect measuring 7 mm in diameter (image 21/3). On coronal imaging this filling defect extends to the ampullary region and mildly expands the duct. The stone measures 14 mm in craniocaudad dimension. More proximally there is a second smaller filling defect within distal common hepatic duct (image 17/3). No focal hepatic lesion. Pancreas: The distal pancreatic duct is mildly dilated to 6 mm (image 17/3). The more proximal duct is normal caliber. The duct in the pancreatic tail is normal caliber. Tiny cystic lesion in the pancreas tail measuring 3 mm. No variant pancreatic ductal anatomy no evidence of pancreatic inflammation. Spleen: Normal spleen. Adrenals/urinary tract: Adrenal glands and kidneys are normal. Stomach/Bowel: Stomach and limited of the small bowel is  unremarkable Vascular/Lymphatic: Abdominal aortic normal caliber. No retroperitoneal periportal lymphadenopathy. Musculoskeletal: No aggressive osseous lesion IMPRESSION: 1. Two filling defects within the common bile duct consistent with choledocholithiasis. The distal stone is large and expands the duct. 2. Mild intrahepatic duct dilatation and moderate extrahepatic biliary duct dilatation. 3. Mild dilatation of the distal pancreatic duct through the pancreatic head likely relates to choledocholithiasis. No evidence of pancreatitis. 4. Postcholecystectomy. Electronically Signed   By: Suzy Bouchard M.D.   On: 01/29/2020 08:11   DG Chest Port 1 View  Result Date: 01/28/2020 CLINICAL DATA:  Level 2 trauma fall EXAM: PORTABLE CHEST 1 VIEW COMPARISON:  01/02/2018 FINDINGS: Post sternotomy changes. No focal opacity or pleural effusion. Stable cardiomediastinal silhouette with borderline cardiomegaly. Aortic atherosclerosis. No pneumothorax. IMPRESSION: Stable.  No significant interval change compared with prior. Electronically Signed   By: Donavan Foil M.D.   On: 01/28/2020 01:42   DG Shoulder Left  Result Date: 01/28/2020 CLINICAL DATA:  Fall 2 weeks ago with left shoulder pain. EXAM: LEFT SHOULDER - 2+ VIEW COMPARISON:  None. FINDINGS: Moderate degenerative changes of the [facility] joint and mild degenerate change of the glenohumeral joint. No acute fracture or dislocation. Subtle focus of increased density in the soft tissues just superior to the greater tuberosity which could represent a subtle chip fracture versus calcification over the region of the supraspinatus tendon. Degenerative changes of the spine. IMPRESSION: 1. Subtle density in the soft tissues just superior to the greater tuberosity which may represent calcification over the region of the supraspinatus tendon and less likely acute rib fracture. 2. Moderate  degenerative changes of the [facility] joint mild degenerate change of the glenohumeral joint.  Electronically Signed   By: Marin Olp M.D.   On: 01/28/2020 08:31   ECHOCARDIOGRAM COMPLETE  Result Date: 01/28/2020    ECHOCARDIOGRAM REPORT   Patient Name:   Eric [name] Date of Exam: 01/28/2020 Medical Rec #:  [id number]      Height:       69.0 in Accession #:    [id number]     Weight:       151.0 lb Date of Birth:  12/21/1926       BSA:          1.833 m Patient Age:    86 years       BP:           110/77 mmHg Patient Gender: M              HR:           97 bpm. Exam Location:  Inpatient Procedure: 2D Echo Indications:    Sepsis  History:        Patient has no prior history of Echocardiogram examinations.  Sonographer:    Johny Chess Referring Phys: [id number] LAURA R GLEASON  Sonographer Comments: Image acquisition challenging due to respiratory motion. IMPRESSIONS  1. Left ventricular ejection fraction, by estimation, is 60 to 65%. The left ventricle has normal function. Left ventricular endocardial border not optimally defined to evaluate regional wall motion. There is moderate concentric left ventricular hypertrophy. Cannot assess LV diastolic filling due to underlying atrial fibrillation.  2. Right ventricular systolic function is normal. The right ventricular size is normal.  3. The mitral valve is degenerative. No evidence of mitral valve regurgitation. Mild to moderate mitral stenosis. The mean mitral valve gradient is 5.0 mmHg. Severe mitral annular calcification.  4. The aortic valve is normal in structure. Aortic valve regurgitation is not visualized. No aortic stenosis is present.  5. The inferior vena cava is normal in size with greater than 50% respiratory variability, suggesting right atrial pressure of 3 mmHg.  6. Left atrial size was severely dilated. FINDINGS  Left Ventricle: Left ventricular ejection fraction, by estimation, is 60 to 65%. The left ventricle has normal function. Left ventricular endocardial border not optimally defined to evaluate regional wall motion. Definity  contrast agent was given IV to delineate the left ventricular endocardial borders. The left ventricular internal cavity size was normal in size. There is moderate concentric left ventricular hypertrophy. Cannot assess LV diastolic filling due to underlying atrial fibrillation. Right Ventricle: The right ventricular size is normal. No increase in right ventricular wall thickness. Right ventricular systolic function is normal. Left Atrium: Left atrial size was severely dilated. Right Atrium: Right atrial size was normal in size. Pericardium: There is no evidence of pericardial effusion. Mitral Valve: The mitral valve is degenerative in appearance. Severe mitral annular calcification. No evidence of mitral valve regurgitation. Mild to moderate mitral valve stenosis. MV peak gradient, 9.6 mmHg. The mean mitral valve gradient is 5.0 mmHg. Tricuspid Valve: The tricuspid valve is normal in structure. Tricuspid valve regurgitation is not demonstrated. No evidence of tricuspid stenosis. Aortic Valve: The aortic valve is normal in structure. Aortic valve regurgitation is not visualized. No aortic stenosis is present. Pulmonic Valve: The pulmonic valve was normal in structure. Pulmonic valve regurgitation is not visualized. No evidence of pulmonic stenosis. Aorta: The aortic root is normal in size and structure. Venous: The inferior vena cava is normal in size  with greater than 50% respiratory variability, suggesting right atrial pressure of 3 mmHg. IAS/Shunts: No atrial level shunt detected by color flow Doppler.  LEFT VENTRICLE PLAX 2D LVIDd:         4.10 cm LVIDs:         2.30 cm LV PW:         1.00 cm LV IVS:        0.90 cm LVOT diam:     2.20 cm LV SV:         53 LV SV Index:   29 LVOT Area:     3.80 cm  LEFT ATRIUM           Index LA diam:      5.20 cm 2.84 cm/m LA Vol (A4C): 89.9 ml 49.03 ml/m  AORTIC VALVE LVOT Vmax:   76.25 cm/s LVOT Vmean:  48.450 cm/s LVOT VTI:    0.138 m MITRAL VALVE MV Peak grad: 9.6 mmHg   SHUNTS MV Mean grad: 5.0 mmHg  Systemic VTI:  0.14 m MV Vmax:      1.55 m/s  Systemic Diam: 2.20 cm MV Vmean:     86.6 cm/s Fransico Him MD Electronically signed by Fransico Him MD Signature Date/Time: 01/28/2020/1:22:36 PM    Final    US Abdomen Limited RUQ  Result Date: 01/28/2020 CLINICAL DATA:  Choledocholithiasis. EXAM: ULTRASOUND ABDOMEN LIMITED RIGHT UPPER QUADRANT COMPARISON:  CT scan January 28, 2020 FINDINGS: Gallbladder: Surgically absent Common bile duct: Diameter: 13.3 mm Intrahepatic ductal dilatation is noted as well as extrahepatic ductal dilatation. No discrete filling defects are seen. Liver: Increased echogenicity with no focal mass. Portal vein is patent on color Doppler imaging with normal direction of blood flow towards the liver. Other: None. IMPRESSION: 1. Intra and extrahepatic biliary duct dilatation, as seen on the CT scan from earlier today. The discrete filling defects in the common bile duct seen on CT imaging today are not visualized on ultrasound. ERCP or MRCP could better evaluate as clinically warranted. Electronically Signed   By: Dorise Bullion III M.D   On: 01/28/2020 13:01       Assessment / Plan:    86 y/o male admitted from nursing home hypoglycemic, hypothermic, and hypotensive. WBC of 12.4 initially. There is concern for sepsis causing this presentation although no clear source found. Blood cultures negative. CT scan showed a dilated CBD to 1.2cm and concern for filling defects / choledocholithiasis. [place] did not show choledocholithiasis, however. MRCP performed which does confirm 2 stones in the distal CBD, largest 57mm in diameter. CBD is 48mm and with mild intrahepatic dilation. Pancreatic duct is also mildly dilated to 51mm.   The patient has never had any pain and his LFTs are normal. I think this may more likely be an incidental finding and not the cause of sepsis. It also remains unclear if the patient truly had sepsis or something else to cause this  presentation, as his cultures are all negative so far.  That being said, he does have significant choledocholithiasis and is at risk for complications from this without removal. On the other hand this stone is large and could be challenging to remove with ERCP and at risk for complications. I tried to speak with his daughter [name] who is his health are proxy and have not been able to get ahold of her to discuss the situation. I will discuss his case with Dr. Rush Landmark who is taking over the GI inpatient service tomorrow.  Allensworth Cellar,  MD [facility]

## 2020-01-29 NOTE — Progress Notes (Signed)
NAME:  Eric [name], MRN:  [id number], DOB:  [birth year], LOS: 1 ADMISSION DATE:  01/28/2020, CONSULTATION DATE:  01/29/20 REFERRING MD: [place] , CHIEF COMPLAINT:  fall   Brief History   86 year old male presenting from assisted living who was found on the ground by staff after unwitnessed falls.  He was altered and hypoglycemic on EMS arrival and found to meet sepsis criteria ED. He remained hypotensive despite 30cc/kg IV fluids so PCCM consulted for admission.  History of present illness   86 year old male with a past medical history significant for atrial fibrillation on Eliquis, heart failure, COPD, dementia, type 2 diabetes, hyperlipidemia, hypertension who resides at a nursing facility and had an unwitnessed fall.  Found on the ground by staff awake but confused.  He had initial glucose of 39 and was altered.  In the emergency department, he was initially hypotensive to 73/49.  He was treated with D50 and 2.5 L of IV fluids along with Vanco, flagyl and cefepime.  Work-up was significant for lactic acid of 2.1, mild  leukocytosis urinalysis 12.4, UA positive for rare bacteria and hemoglobin.  CT head and cervical spine had no acute findings and chest x-ray without infiltrate.  CT abdomen/pelvis is pending; peripheral Levophed was started and PCCM consulted for admission  Past Medical History   has a past medical history of Atrial fibrillation ([place]), CHF (congestive heart failure) ([place]), COPD (chronic obstructive pulmonary disease) ([place]), Dementia ([place]), Diabetes mellitus without complication ([place]), GERD (gastroesophageal reflux disease), Hyperlipemia, Hypertension, Iron deficiency, and Major depressive disorder.  Significant Hospital Events   01/29/20-admit to PCCM  Consults:   Procedures:   Significant Diagnostic Tests:  01/29/20 CT head>> chronic right occipital lobe infarct with generalized cerebral atrophy, no acute findings 01/29/20 CT C-spine>> no fractures, marked severe  degenerative changes   Micro Data:  01/29/20 blood cultures x2.>> 01/29/20 urine culture>> 01/29/20 SARS-Cov-2>>  Antimicrobials:  Vancomycin 4/10- Cefepime 4/10- Flagyl 4/10  Interim history/subjective:   No issues overnight.  Down for MRI this morning.  Objective   Blood pressure 115/87, pulse (!) 119, temperature 98.4 F (36.9 C), temperature source Oral, resp. rate (!) 30, height 5\' 9"  (1.753 m), weight 69.4 kg, SpO2 97 %.        Intake/Output Summary (Last 24 hours) at 01/29/2020 0745 Last data filed at 01/29/2020 0700 Gross per 24 hour  Intake 2721.13 ml  Output 1025 ml  Net 1696.13 ml   Filed Weights   01/28/20 0012 01/28/20 0659 01/29/20 0600  Weight: 81.6 kg 68.5 kg 69.4 kg    General: Elderly male resting in bed no distress HEENT: Tracking appropriately Neuro: Moves all 4 extremities, alert oriented CV: S1-S2, regular rate and rhythm PULM: Clear to auscultation bilaterally no crackles no wheeze GI: Nontender nondistended Extremities: Edema Skin: Multiple skin tears  Resolved Hospital Problem list     Assessment & Plan:   Severe sepsis, like syndrome  -Source unclear currently, CT scan abdomen pelvis is pending -Initial lactic acid 2.1 -Continued hypotension despite 2.5 L fluids in the ED - common for elderly to present hypoglycemic and hypothermic related to infection P: -Antibiotics deescalated to cefepime -CT of the abdomen pelvis with possible choledocholithiasis -Right upper quadrant ultrasound that was unclear and did not compare with CT imaging. -GI has seen and evaluated patient and ordered MRCP. -We appreciate their input  Altered mental status and unwitnessed fall -Baseline dementia, encephalopathy has improved with correction of hypoglycemia -CT head with old CVA but no acute findings P: -  Likely hypoglycemia related to sepsis picture -Resolved at this point -Continue p.o. intake  Renal insufficiency, possibly acute -Creatinine  1.7, no records in epic or care everywhere to establish a baseline P: -Continue to monitor urine output -Avoid nephrotoxic agents -Follow renal function  Diabetes -Holding Lantus -Continue CBGs with SSI  Atrial fibrillation, hypertension, heart failure -in atrial fibrillation with HR 80's P: -Eliquis held -Transition to heparin infusion  History of COPD -no wheezing on exam and no PFT's on record P: -Continue nebs -Yupelri, as needed albuterol  Best practice:  Diet: npo until speech eval Pain/Anxiety/Delirium protocol (if indicated): Tylenol VAP protocol (if indicated): n/a DVT prophylaxis: Eliquis GI prophylaxis: n/a Glucose control: SSI Mobility: bed rest Code Status: DNR Family Communication: attempted to call patient's daughter but no answer Disposition: ICU  Labs   CBC: Recent Labs  Lab 01/28/20 0031 01/28/20 0037 01/28/20 0705  WBC 12.4*  --  9.2  HGB 11.9* 11.9* 11.9*  HCT 37.7* 35.0* 37.3*  MCV 99.2  --  97.6  PLT 155  --  [id number]    Basic Metabolic Panel: Recent Labs  Lab 01/28/20 0031 01/28/20 0037 01/28/20 0705 01/29/20 0424  NA 141 141 137 138  K 4.4 4.1 4.5 4.4  CL 109 107 109 109  CO2 23  --  19* 22  GLUCOSE 105* 100* 238* 160*  BUN 41* 40* 35* 28*  CREATININE 1.74* 1.70* 1.45* 1.43*  CALCIUM 9.3  --  8.4* 8.2*  MG  --   --  1.7  --   PHOS  --   --  1.7*  --    GFR: Estimated Creatinine Clearance: 33 mL/min (A) (by C-G formula based on SCr of 1.43 mg/dL (H)). Recent Labs  Lab 01/28/20 0031 01/28/20 0144 01/28/20 0705  WBC 12.4*  --  9.2  LATICACIDVEN  --  2.1* 1.2    Liver Function Tests: Recent Labs  Lab 01/28/20 0031 01/29/20 0424  AST 26 24  ALT 14 13  ALKPHOS 58 49  BILITOT 0.6 0.6  PROT 6.6 5.5*  ALBUMIN 3.6 2.9*   No results for input(s): LIPASE, AMYLASE in the last 168 hours. No results for input(s): AMMONIA in the last 168 hours.  ABG    Component Value Date/Time   TCO2 25 01/28/2020 0037      Coagulation Profile: Recent Labs  Lab 01/28/20 0031  INR 1.1    Cardiac Enzymes: No results for input(s): CKTOTAL, CKMB, CKMBINDEX, TROPONINI in the last 168 hours.  HbA1C: No results found for: HGBA1C  CBG: Recent Labs  Lab 01/28/20 1635 01/28/20 2210 01/28/20 2243 01/28/20 2316 01/29/20 0001  GLUCAP 275* 57* 50* 71 127*    Review of Systems:   Negative except as noted in HPI  Past Medical History  He,  has a past medical history of Atrial fibrillation ([place]), CHF (congestive heart failure) ([place]), COPD (chronic obstructive pulmonary disease) ([place]), Dementia ([place]), Diabetes mellitus without complication ([place]), GERD (gastroesophageal reflux disease), Hyperlipemia, Hypertension, Iron deficiency, and Major depressive disorder.   Surgical History   History reviewed. No pertinent surgical history.   Social History   reports that he has never smoked. He has never used smokeless tobacco. He reports previous alcohol use. He reports previous drug use.   Family History   His family history is not on file.   Allergies Allergies  Allergen Reactions  . Vitamin B12 Swelling and Other (See Comments)    A "gummie" version caused swelling of the legs  .  Ampicillin Rash     Home Medications  Prior to Admission medications   Medication Sig Start Date End Date Taking? Authorizing Provider  acetaminophen (TYLENOL) 325 MG tablet Take 650 mg by mouth 3 (three) times daily.   Yes [provider]  apixaban (ELIQUIS) 2.5 MG TABS tablet Take 5 mg by mouth 2 (two) times daily.   Yes [provider]  Ensure (ENSURE) Take 120 mLs by mouth with breakfast, with lunch, and with evening meal.   Yes [provider]  FLUoxetine (PROZAC) 20 MG capsule Take 20 mg by mouth daily.   Yes [provider]  insulin glargine (LANTUS SOLOSTAR) 100 UNIT/ML Solostar Pen Inject 24 Units into the skin at bedtime.   Yes [provider]  lisinopril (ZESTRIL) 5 MG  tablet Take 5 mg by mouth See admin instructions. Take 5 mg by mouth at bedtime and hold for a Systolic B/P less than [id number] or Diastolic B/P of less than 60   Yes [provider]  Multiple Vitamins-Minerals (SENIOR MULTIVITAMIN PLUS PO) Take 1 tablet by mouth daily with breakfast.   Yes [provider]  ondansetron (ZOFRAN) 4 MG tablet Take 4 mg by mouth every 6 (six) hours as needed for nausea or vomiting.   Yes [provider]  potassium chloride (KLOR-CON) 20 MEQ packet Take 20 mEq by mouth daily.   Yes [provider]  sennosides-docusate sodium (SENOKOT-S) 8.6-50 MG tablet Take 1 tablet by mouth See admin instructions. Take 1 tablet by mouth at bedtime and hold for loose or liquid stools   Yes [provider]  simethicone (MYLICON) 80 MG chewable tablet Chew 80 mg by mouth 4 (four) times daily -  before meals and at bedtime.   Yes [provider]  tiotropium (SPIRIVA) 18 MCG inhalation capsule Place 18 mcg into inhaler and inhale daily.   Yes [provider]     Garner Nash, DO [facility] 01/29/2020 7:45 AM

## 2020-01-29 NOTE — Progress Notes (Signed)
This Probation officer called pts daughter [name] and informed her that Eric [name] would be moved to [address] room #4 later this afternoon.  Irven Baltimore, RN

## 2020-01-30 DIAGNOSIS — E162 Hypoglycemia, unspecified: Secondary | ICD-10-CM

## 2020-01-30 DIAGNOSIS — W19XXXA Unspecified fall, initial encounter: Secondary | ICD-10-CM | POA: Diagnosis not present

## 2020-01-30 DIAGNOSIS — L8990 Pressure ulcer of unspecified site, unspecified stage: Secondary | ICD-10-CM | POA: Diagnosis present

## 2020-01-30 DIAGNOSIS — K8689 Other specified diseases of pancreas: Secondary | ICD-10-CM

## 2020-01-30 DIAGNOSIS — K838 Other specified diseases of biliary tract: Secondary | ICD-10-CM

## 2020-01-30 DIAGNOSIS — R932 Abnormal findings on diagnostic imaging of liver and biliary tract: Secondary | ICD-10-CM | POA: Diagnosis not present

## 2020-01-30 DIAGNOSIS — R933 Abnormal findings on diagnostic imaging of other parts of digestive tract: Secondary | ICD-10-CM

## 2020-01-30 LAB — GLUCOSE, CAPILLARY
Glucose-Capillary: 127 mg/dL — ABNORMAL HIGH (ref 70–99)
Glucose-Capillary: 130 mg/dL — ABNORMAL HIGH (ref 70–99)
Glucose-Capillary: 138 mg/dL — ABNORMAL HIGH (ref 70–99)
Glucose-Capillary: 208 mg/dL — ABNORMAL HIGH (ref 70–99)

## 2020-01-30 LAB — HEMOGLOBIN A1C
Hgb A1c MFr Bld: 6.6 % — ABNORMAL HIGH (ref 4.8–5.6)
Mean Plasma Glucose: 143 mg/dL

## 2020-01-30 LAB — HEPARIN LEVEL (UNFRACTIONATED)
Heparin Unfractionated: 0.31 IU/mL (ref 0.30–0.70)
Heparin Unfractionated: 0.33 IU/mL (ref 0.30–0.70)

## 2020-01-30 LAB — APTT
aPTT: 50 seconds — ABNORMAL HIGH (ref 24–36)
aPTT: 73 seconds — ABNORMAL HIGH (ref 24–36)

## 2020-01-30 MED ORDER — MAGNESIUM SULFATE 2 GM/50ML IV SOLN
2.0000 g | Freq: Once | INTRAVENOUS | Status: AC
  Administered 2020-01-30: 2 g via INTRAVENOUS
  Filled 2020-01-30 (×2): qty 50

## 2020-01-30 NOTE — Progress Notes (Addendum)
ANTICOAGULATION CONSULT NOTE - Initial Consult  Pharmacy Consult for heparin Indication: atrial fibrillation  Patient Measurements: Height: 5\' 9"  (175.3 cm) Weight: 69.4 kg (153 lb) IBW/kg (Calculated) : 70.7 Heparin Dosing Weight: 68.5kg  Vital Signs: Temp: 97.6 F (36.4 C) (04/12 0249) Temp Source: Oral (04/12 0249) BP: 104/61 (04/12 0249) Pulse Rate: 84 (04/12 0249)  Labs: Recent Labs    01/28/20 0031 01/28/20 0031 01/28/20 0037 01/28/20 0705 01/28/20 1931 01/29/20 0424 01/30/20 0456 01/30/20 1047  HGB 11.9*   < > 11.9* 11.9*  --   --   --   --   HCT 37.7*  --  35.0* 37.3*  --   --   --   --   PLT 155  --   --  159  --   --   --   --   APTT  --   --   --  31   < > 96* 50* 73*  LABPROT 14.3  --   --   --   --   --   --   --   INR 1.1  --   --   --   --   --   --   --   HEPARINUNFRC  --   --   --  0.87*   < > 0.75* 0.31 0.33  CREATININE 1.74*   < > 1.70* 1.45*  --  1.43*  --   --    < > = values in this interval not displayed.   Estimated Creatinine Clearance: 33 mL/min (A) (by C-G formula based on SCr of 1.43 mg/dL (H)).   Assessment: 86 year old on Eliquis prior to admission for atrial fibrillation (CHADS2VASc = 5; last dose 4/9 at 2000) now with concern for possible choledocholithiasis. Pharmacy consulted to transition to IV Heparin while work-up ongoing.  Patient has multiple skin tears and bruises on bilateral upper arms wrapped with gauze, kerlix, abdominal pads and tape, will target lower goal level. aPTT is more closely correlating with heparin level but may still have some apixaban in system. HL is at goal but aPTT is subtherapeutic with larger than anticipated drop with rate decrease. Will hold rate change for now, repeat level, and f/u if GI plans for procedure.    Goal of Therapy:  Heparin Level 0.3- 0.5 units/ml  APTT 66-102 sec Monitor platelets by anticoagulation protocol: Yes   Plan:  Continue heparin at 850 units/hr F/u repeat HL and aPTT F/u  aPTT until correlates with heparin level  Monitor daily HL, CBC/plt Monitor for signs/symptoms of bleeding   ADDENDUM 12:30: repeat HL and aPTT both therapeutic. Will follow HL from now and continue lower goal range.  -Continue heparin at 850 units/hr  -F/u if GI procedure is planned    Benetta Spar, PharmD, BCPS, [place] Pharmacist  Please check AMION for all [place] phone numbers After 10:00 PM, call [place] [phone]

## 2020-01-30 NOTE — TOC Initial Note (Signed)
Transition of Care [facility]) - Initial/Assessment Note    Patient Details  Name: Eric [name] MRN: [id number] Date of Birth: 1927-06-12  Transition of [facility]) CM/SW Contact:    Sharin Mons, RN Phone Number: [phone] 01/30/2020, 11:32 AM  Clinical Narrative:     Pt presents from [facility] s/p unwitnessed fall, AMS/ Sepsis. Hx of dementia, COPD, AF on eliquis, CHF, IDDM. From [place] SNF/LTC.  NCM spoke with pt's daughter [name] and confirmed d/c plan. [name] stated family would like for pt to return to [facility] @ d/c.  [facility] admission liaison made aware. Pt will need updated COVID if d/c after 4/13.  [name] (Daughter)     [phone]      [facility] team monitoring for TOC needs ....  Expected Discharge Plan: [place] Barriers to Discharge: Continued Medical Work up   Patient Goals and CMS Choice        Expected Discharge Plan and Services Expected Discharge Plan: [place]                                              Prior Living Arrangements/Services                       Activities of Daily Living      Permission Sought/Granted                  Emotional Assessment              Admission diagnosis:  Hypoglycemia [E16.2] Fall [W19.XXXA] Fall, initial encounter [id number]] Sepsis ([place]) [A41.9] Hypotension, unspecified hypotension type [I95.9] Patient Active Problem List   Diagnosis Date Noted  . Choledocholithiasis   . Sepsis ([place]) 01/28/2020  . Fall   . Hypoglycemia   . Atrial fibrillation ([place])   . Shock ([place])   . Abnormal liver diagnostic imaging    PCP:  Gelene Mink, FNP Pharmacy:   [facility] [place] [address] [place] [address] Phone: [phone] Fax: [phone]     Social Determinants of Health ([place]) Interventions    Readmission Risk Interventions No flowsheet data found.

## 2020-01-30 NOTE — Progress Notes (Signed)
[facility] PROGRESS NOTE    Progress Note  [name]  [id number] DOB: January 06, 1927 DOA: 01/28/2020 PCP: Gelene Mink, FNP     Brief Narrative:   Eric [name] is an 86 y.o. male who resides in assisted living facility was found on the ground after an unwitnessed fall by staff, with past medical history of atrial fibrillation, Eliquis heart failure, COPD dementia, diabetes mellitus type 2 in the emergency room he was found to be hypertensive treated with IV fluids started empirically on IV vancomycin, Flagyl and cefepime.  With a mild ovation lactic acid white blood cell count of 12 and UA rare bacteria.  CT of the C-spine showed no acute findings chest x-ray shows no infiltrates. PCCM was consulted for admission as he was started on peripherally on Levophed.  Assessment/Plan:   Severe Sepsis   of unclear source:  MRCP was done that showed 2 filling defect within the common bile duct consistent with choledocholithiasis, distal stone larger and expanding the duct  GI was consulted and they are leaning towards an ERCP try to contact daughter.  Acute encephalopathy/unwitnessed fall: Cephalopathy did improve with correction of hypoglycemia, CT scan showed no acute findings.  Now resolved likely due to hypoglycemia.  Acute kidney injury: Baseline, on admission 1.7 with hydration now 1.4 we will check a basic metabolic panel tomorrow morning. Likely prerenal azotemia.  Uncontrolled diabetes mellitus type 2: Currently on sliding scale.  Chronic atrial fibrillation: Eliquis has been held heart rate is less than 100, he has been transitioned to IV heparin for the possibility of an ERCP.  Pressure injury of skin  RN Pressure Injury Documentation: Pressure Injury 01/28/20 Sacrum Posterior;Mid Stage 2 -  Partial thickness loss of dermis presenting as a shallow open injury with a red, pink wound bed without slough. 6cm x 5cm red, nonblanchable area w/ 1cm x 0.1cm open slit  (Active)  01/28/20 0645  Location: Sacrum  Location Orientation: Posterior;Mid  Staging: Stage 2 -  Partial thickness loss of dermis presenting as a shallow open injury with a red, pink wound bed without slough.  Wound Description (Comments): 6cm x 5cm red, nonblanchable area w/ 1cm x 0.1cm open slit  Present on Admission: Yes     Pressure Injury 01/28/20 Heel Left;Posterior Stage 1 -  Intact skin with non-blanchable redness of a localized area usually over a bony prominence. 3cm x 2cm red nonblanchable area (Active)  01/28/20 0645  Location: Heel  Location Orientation: Left;Posterior  Staging: Stage 1 -  Intact skin with non-blanchable redness of a localized area usually over a bony prominence.  Wound Description (Comments): 3cm x 2cm red nonblanchable area  Present on Admission: Yes    Estimated body mass index is 22.59 kg/m as calculated from the following:   Height as of this encounter: 5\' 9"  (1.753 m).   Weight as of this encounter: 69.4 kg.   DVT prophylaxis: Heparin Family Communication:Daughter Status is: Inpatient  Remains inpatient appropriate because:Hemodynamically unstable   Dispo: The patient is from: SNF              Anticipated d/c is to: SNF              Anticipated d/c date is: 3 days              Patient currently is not medically stable to d/c.         Code Status:     Code Status Orders  (From admission, onward)  Start     Ordered   01/28/20 0617  Do not attempt resuscitation (DNR)  Continuous    Question Answer Comment  In the event of cardiac or respiratory ARREST Do not call a "code blue"   In the event of cardiac or respiratory ARREST Do not perform Intubation, CPR, defibrillation or ACLS   In the event of cardiac or respiratory ARREST Use medication by any route, position, wound care, and other measures to relive pain and suffering. May use oxygen, suction and manual treatment of airway obstruction as needed for comfort.       01/28/20 0616        Code Status History    Date Active Date Inactive Code Status Order ID Comments User Context   01/28/2020 0518 01/28/2020 0616 Full Code [id number]  Gleason, [facility]   Advance Care Planning Activity        IV Access:    Peripheral IV   Procedures and diagnostic studies:   MR ABDOMEN MRCP WO CONTRAST  Result Date: 01/29/2020 CLINICAL DATA:  Choledocholithiasis suspected on CT exam. EXAM: MRI ABDOMEN WITHOUT CONTRAST  (INCLUDING MRCP) TECHNIQUE: Multiplanar multisequence MR imaging of the abdomen was performed. Heavily T2-weighted images of the biliary and pancreatic ducts were obtained, and three-dimensional MRCP images were rendered by post processing. COMPARISON:  CT 01/28/2020, ultrasound 01/28/2019 FINDINGS: Lower chest:  Lung bases are clear. Hepatobiliary: Mild intrahepatic biliary duct dilatation. Moderate extrahepatic biliary duct dilatation. Common hepatic duct measures 10 mm. The common bile duct measures 10 mm. Within the distal common bile duct there is a large filling defect measuring 7 mm in diameter (image 21/3). On coronal imaging this filling defect extends to the ampullary region and mildly expands the duct. The stone measures 14 mm in craniocaudad dimension. More proximally there is a second smaller filling defect within distal common hepatic duct (image 17/3). No focal hepatic lesion. Pancreas: The distal pancreatic duct is mildly dilated to 6 mm (image 17/3). The more proximal duct is normal caliber. The duct in the pancreatic tail is normal caliber. Tiny cystic lesion in the pancreas tail measuring 3 mm. No variant pancreatic ductal anatomy no evidence of pancreatic inflammation. Spleen: Normal spleen. Adrenals/urinary tract: Adrenal glands and kidneys are normal. Stomach/Bowel: Stomach and limited of the small bowel is unremarkable Vascular/Lymphatic: Abdominal aortic normal caliber. No retroperitoneal periportal lymphadenopathy. Musculoskeletal:  No aggressive osseous lesion IMPRESSION: 1. Two filling defects within the common bile duct consistent with choledocholithiasis. The distal stone is large and expands the duct. 2. Mild intrahepatic duct dilatation and moderate extrahepatic biliary duct dilatation. 3. Mild dilatation of the distal pancreatic duct through the pancreatic head likely relates to choledocholithiasis. No evidence of pancreatitis. 4. Postcholecystectomy. Electronically Signed   By: Suzy Bouchard M.D.   On: 01/29/2020 08:11     Medical Consultants:    None.  Anti-Infectives:   Cefepime  Subjective:    Kyro Thoroughman is nonverbal this morning.  Objective:    Vitals:   01/29/20 1527 01/29/20 2038 01/30/20 0249 01/30/20 0835  BP: 130/72 (!) 154/90 104/61 119/62  Pulse:  96 84 78  Resp: (!) 25 18 16 16   Temp: 98.6 F (37 C) 98.5 F (36.9 C) 97.6 F (36.4 C) 98.6 F (37 C)  TempSrc: Oral Oral Oral   SpO2: 97% 98% 97% 97%  Weight:      Height:       SpO2: 97 %   Intake/Output Summary (Last 24 hours) at 01/30/2020  [facility] filed at 01/30/2020 1100 Gross per 24 hour  Intake 375.31 ml  Output 800 ml  Net -424.69 ml   Filed Weights   01/28/20 0012 01/28/20 0659 01/29/20 0600  Weight: 81.6 kg 68.5 kg 69.4 kg    Exam: General exam: In no acute distress. Respiratory system: Good air movement and clear to auscultation. Cardiovascular system: S1 & S2 heard, RRR. No JVD. Gastrointestinal system: Abdomen is nondistended, soft and nontender.  Extremities: No pedal edema. Skin: No rashes, lesions or ulcers  Data Reviewed:    Labs: Basic Metabolic Panel: Recent Labs  Lab 01/28/20 0031 01/28/20 0031 01/28/20 0037 01/28/20 0037 01/28/20 0705 01/29/20 0424  NA 141  --  141  --  137 138  K 4.4   < > 4.1   < > 4.5 4.4  CL 109  --  107  --  109 109  CO2 23  --   --   --  19* 22  GLUCOSE 105*  --  100*  --  238* 160*  BUN 41*  --  40*  --  35* 28*  CREATININE 1.74*  --  1.70*  --   1.45* 1.43*  CALCIUM 9.3  --   --   --  8.4* 8.2*  MG  --   --   --   --  1.7 1.5*  PHOS  --   --   --   --  1.7* 2.4*   < > = values in this interval not displayed.   GFR Estimated Creatinine Clearance: 33 mL/min (A) (by C-G formula based on SCr of 1.43 mg/dL (H)). Liver Function Tests: Recent Labs  Lab 01/28/20 0031 01/29/20 0424  AST 26 24  ALT 14 13  ALKPHOS 58 49  BILITOT 0.6 0.6  PROT 6.6 5.5*  ALBUMIN 3.6 2.9*   No results for input(s): LIPASE, AMYLASE in the last 168 hours. No results for input(s): AMMONIA in the last 168 hours. Coagulation profile Recent Labs  Lab 01/28/20 0031  INR 1.1   COVID-19 Labs  No results for input(s): DDIMER, FERRITIN, LDH, CRP in the last 72 hours.  Lab Results  Component Value Date   [place] NEGATIVE 01/28/2020    CBC: Recent Labs  Lab 01/28/20 0031 01/28/20 0037 01/28/20 0705  WBC 12.4*  --  9.2  HGB 11.9* 11.9* 11.9*  HCT 37.7* 35.0* 37.3*  MCV 99.2  --  97.6  PLT 155  --  159   Cardiac Enzymes: No results for input(s): CKTOTAL, CKMB, CKMBINDEX, TROPONINI in the last 168 hours. BNP (last 3 results) No results for input(s): PROBNP in the last 8760 hours. CBG: Recent Labs  Lab 01/29/20 1217 01/29/20 1623 01/29/20 2130 01/30/20 0632 01/30/20 1150  GLUCAP 137* 184* 122* 127* 130*   D-Dimer: No results for input(s): DDIMER in the last 72 hours. Hgb A1c: Recent Labs    01/28/20 0830  HGBA1C 6.6*   Lipid Profile: No results for input(s): CHOL, HDL, LDLCALC, TRIG, CHOLHDL, LDLDIRECT in the last 72 hours. Thyroid function studies: No results for input(s): TSH, T4TOTAL, T3FREE, THYROIDAB in the last 72 hours.  Invalid input(s): FREET3 Anemia work up: No results for input(s): VITAMINB12, FOLATE, FERRITIN, TIBC, IRON, RETICCTPCT in the last 72 hours. Sepsis Labs: Recent Labs  Lab 01/28/20 0031 01/28/20 0144 01/28/20 0705  WBC 12.4*  --  9.2  LATICACIDVEN  --  2.1* 1.2   Microbiology Recent Results  (from the past 240 hour(s))  Blood culture (  routine x 2)     Status: None (Preliminary result)   Collection Time: 01/28/20 12:27 AM   Specimen: BLOOD  Result Value Ref Range Status   Specimen Description BLOOD LEFT ARM  Final   Special Requests   Final    BOTTLES DRAWN AEROBIC ONLY Blood Culture results may not be optimal due to an inadequate volume of blood received in culture bottles   Culture   Final    NO GROWTH 2 DAYS Performed at [facility], [place] [address], [place], [place] [address]    Report Status PENDING  Incomplete  Blood culture (routine x 2)     Status: None (Preliminary result)   Collection Time: 01/28/20 12:32 AM   Specimen: BLOOD  Result Value Ref Range Status   Specimen Description BLOOD RIGHT FOREARM  Final   Special Requests   Final    BOTTLES DRAWN AEROBIC AND ANAEROBIC Blood Culture results may not be optimal due to an inadequate volume of blood received in culture bottles   Culture   Final    NO GROWTH 2 DAYS Performed at [facility], [place] [address], [place], [place] [address]    Report Status PENDING  Incomplete  SARS CORONAVIRUS 2 (TAT 6-24 HRS) Nasopharyngeal Nasopharyngeal Swab     Status: None   Collection Time: 01/28/20  2:23 AM   Specimen: Nasopharyngeal Swab  Result Value Ref Range Status   SARS Coronavirus 2 NEGATIVE NEGATIVE Final    Comment: (NOTE) SARS-CoV-2 target nucleic acids are NOT DETECTED. The SARS-CoV-2 RNA is generally detectable in upper and lower respiratory specimens during the acute phase of infection. Negative results do not preclude SARS-CoV-2 infection, do not rule out co-infections with other pathogens, and should not be used as the sole basis for treatment or other patient management decisions. Negative results must be combined with clinical observations, patient history, and epidemiological information. The expected result is Negative. Fact Sheet for Patients: SugarRoll.be Fact  Sheet for Healthcare Providers: https://www.woods-mathews.com/ This test is not yet approved or cleared by the [place] FDA and  has been authorized for detection and/or diagnosis of SARS-CoV-2 by FDA under an Emergency Use Authorization (EUA). This EUA will remain  in effect (meaning this test can be used) for the duration of the COVID-19 declaration under Section 56 4(b)(1) of the Act, 21 U.S.C. section 360bbb-3(b)(1), unless the authorization is terminated or revoked sooner. Performed at [facility], [place] [address] [place], [place] [address]   MRSA PCR Screening     Status: None   Collection Time: 01/28/20  7:41 AM   Specimen: Nasal Mucosa; Nasopharyngeal  Result Value Ref Range Status   MRSA by PCR NEGATIVE NEGATIVE Final    Comment:        The GeneXpert MRSA Assay (FDA approved for NASAL specimens only), is one component of a comprehensive MRSA colonization surveillance program. It is not intended to diagnose MRSA infection nor to guide or monitor treatment for MRSA infections. Performed at [facility], [place] [address], [place],  [address]      Medications:   . Chlorhexidine Gluconate Cloth  6 each Topical Q0600  . dextrose  1 ampule Intravenous Once  . insulin aspart  0-15 Units Subcutaneous TID WC  . insulin aspart  0-5 Units Subcutaneous QHS  . revefenacin  175 mcg Nebulization Daily   Continuous Infusions: . sodium chloride Stopped (01/29/20 1214)  . ceFEPime (MAXIPIME) IV 2 g (01/30/20 0919)  .  heparin 850 Units/hr (01/29/20 1621)  . lactated ringers 75 mL/hr at 01/29/20 2150  . magnesium sulfate bolus IVPB    . norepinephrine (LEVOPHED) Adult infusion Stopped (01/29/20 0352)  . sodium chloride Stopped (01/28/20 0405)      LOS: 2 days   Charlynne Cousins  [facility]  01/30/2020, 12:42 PM

## 2020-01-30 NOTE — Progress Notes (Signed)
Patient was restless and tried to get out of the bed, wanting to go home. Patient pulled out his condom cath and IV on his left arm. This RN paged PCCM on call twice but was not able to get a response. Patient has multiple skin tears and bruises on bilateral upper arms wrapped with gauze, kerlix, abdominal pads and tape. Wound care consulted. Patient assisted to [facility] with 2 staffs. Patient currently resting comfortably on low bed. Will continue to monitor.

## 2020-01-30 NOTE — Plan of Care (Signed)
  Problem: Education: Goal: Knowledge of General Education information will improve Description Including pain rating scale, medication(s)/side effects and non-pharmacologic comfort measures Outcome: Progressing   

## 2020-01-30 NOTE — Consult Note (Signed)
WOC Nurse Consult Note: Patient receiving care in [facility].   Reason for Consult: multiple skin tears Wound type: trauma injuries to BUE Pressure Injury POA: Yes/No/NA Measurement: BUE have multiple dressings of multiple types covering the affected areas. Wound bed: deferred Drainage (amount, consistency, odor) no strike through on existing dressings Periwound: deferred Dressing procedure/placement/frequency:  For all skin tears on the arms:  Generously saturate existing dressing with saline to facilitate removal of adhered dressings.  Place a Mepitel dressing Kellie Simmering [phone]) over each skin tear area.  The mepitels can stay in place up to 5 days.  Then cover each area with a ABD pad, secure with kerlex. Change the ABD pads and kerlex daily. I have asked the Unit Secretary to order the Mepitel for dressing changes. Monitor the wound area(s) for worsening of condition such as: Signs/symptoms of infection,  Increase in size,  Development of or worsening of odor, Development of pain, or increased pain at the affected locations.  Notify the medical team if any of these develop.  Thank you for the consult.  Discussed plan of care with the patient and bedside nurse.  [facility] nurse will not follow at this time.  Please re-consult the [place] team if needed.  Val Riles, RN, MSN, CWOCN, CNS-BC, pager [phone]

## 2020-01-30 NOTE — Progress Notes (Signed)
Progress Note    ASSESSMENT AND PLAN:   86 yo male with pmh significant for AFIB, COPD, DM, HTN, dementia  # choledocholithiasis --biliary duct dilation but normal liver tests --Concern for sepsis on admission but BC negative at day 2. He is afebrie and wbc quickly normalized from 12.4 to 9.2. Choledocholithiasis probably incidental finding. Still needs to be addressed as it could lead to cholangitis at some point. I tried to reach daughter [name] this am to discuss if ERCP desired. I left her a voicemail and will try again later. Patient's DNI would need to be rescinded for the procedure.        SUBJECTIVE    No abdominal pain. Feels "worn out".   OBJECTIVE:     Vital signs in last 24 hours: Temp:  [97.6 F (36.4 C)-98.9 F (37.2 C)] 98.6 F (37 C) (04/12 0835) Pulse Rate:  [78-96] 78 (04/12 0835) Resp:  [16-25] 16 (04/12 0835) BP: (104-154)/(61-90) 119/62 (04/12 0835) SpO2:  [97 %-98 %] 97 % (04/12 0835) Last BM Date: 01/29/20 General:   Easily awoken in NAD Heart:  Regular rate and rhythm;  No lower extremity edema   Pulm: Normal respiratory effort   Abdomen:  Soft, nondistended, non-tender.  Normal bowel sounds.           Intake/Output from previous day: 04/11 0701 - 04/12 0700 In: 783 [I.V.:682.9; IV Piggyback:100.1] Out: 800 [Urine:800] Intake/Output this shift: No intake/output data recorded.  Lab Results: Recent Labs    01/28/20 0031 01/28/20 0037 01/28/20 0705  WBC 12.4*  --  9.2  HGB 11.9* 11.9* 11.9*  HCT 37.7* 35.0* 37.3*  PLT 155  --  159   BMET Recent Labs    01/28/20 0031 01/28/20 0031 01/28/20 0037 01/28/20 0705 01/29/20 0424  NA 141   < > 141 137 138  K 4.4   < > 4.1 4.5 4.4  CL 109   < > 107 109 109  CO2 23  --   --  19* 22  GLUCOSE 105*   < > 100* 238* 160*  BUN 41*   < > 40* 35* 28*  CREATININE 1.74*   < > 1.70* 1.45* 1.43*  CALCIUM 9.3  --   --  8.4* 8.2*   < > = values in this interval not displayed.   LFT Recent  Labs    01/29/20 0424  PROT 5.5*  ALBUMIN 2.9*  AST 24  ALT 13  ALKPHOS 49  BILITOT 0.6   PT/INR Recent Labs    01/28/20 0031  LABPROT 14.3  INR 1.1   Hepatitis Panel No results for input(s): HEPBSAG, HCVAB, HEPAIGM, HEPBIGM in the last 72 hours.  MR ABDOMEN MRCP WO CONTRAST  Result Date: 01/29/2020 CLINICAL DATA:  Choledocholithiasis suspected on CT exam. EXAM: MRI ABDOMEN WITHOUT CONTRAST  (INCLUDING MRCP) TECHNIQUE: Multiplanar multisequence MR imaging of the abdomen was performed. Heavily T2-weighted images of the biliary and pancreatic ducts were obtained, and three-dimensional MRCP images were rendered by post processing. COMPARISON:  CT 01/28/2020, ultrasound 01/28/2019 FINDINGS: Lower chest:  Lung bases are clear. Hepatobiliary: Mild intrahepatic biliary duct dilatation. Moderate extrahepatic biliary duct dilatation. Common hepatic duct measures 10 mm. The common bile duct measures 10 mm. Within the distal common bile duct there is a large filling defect measuring 7 mm in diameter (image 21/3). On coronal imaging this filling defect extends to the ampullary region and mildly expands the duct. The stone measures 14 mm in craniocaudad  dimension. More proximally there is a second smaller filling defect within distal common hepatic duct (image 17/3). No focal hepatic lesion. Pancreas: The distal pancreatic duct is mildly dilated to 6 mm (image 17/3). The more proximal duct is normal caliber. The duct in the pancreatic tail is normal caliber. Tiny cystic lesion in the pancreas tail measuring 3 mm. No variant pancreatic ductal anatomy no evidence of pancreatic inflammation. Spleen: Normal spleen. Adrenals/urinary tract: Adrenal glands and kidneys are normal. Stomach/Bowel: Stomach and limited of the small bowel is unremarkable Vascular/Lymphatic: Abdominal aortic normal caliber. No retroperitoneal periportal lymphadenopathy. Musculoskeletal: No aggressive osseous lesion IMPRESSION: 1. Two  filling defects within the common bile duct consistent with choledocholithiasis. The distal stone is large and expands the duct. 2. Mild intrahepatic duct dilatation and moderate extrahepatic biliary duct dilatation. 3. Mild dilatation of the distal pancreatic duct through the pancreatic head likely relates to choledocholithiasis. No evidence of pancreatitis. 4. Postcholecystectomy. Electronically Signed   By: Suzy Bouchard M.D.   On: 01/29/2020 08:11   ECHOCARDIOGRAM COMPLETE  Result Date: 01/28/2020    ECHOCARDIOGRAM REPORT   Patient Name:   Eric [name] Date of Exam: 01/28/2020 Medical Rec #:  [id number]      Height:       69.0 in Accession #:    [id number]     Weight:       151.0 lb Date of Birth:  Feb 21, 1927       BSA:          1.833 m Patient Age:    56 years       BP:           110/77 mmHg Patient Gender: M              HR:           97 bpm. Exam Location:  Inpatient Procedure: 2D Echo Indications:    Sepsis  History:        Patient has no prior history of Echocardiogram examinations.  Sonographer:    Johny Chess Referring Phys: [id number] LAURA R GLEASON  Sonographer Comments: Image acquisition challenging due to respiratory motion. IMPRESSIONS  1. Left ventricular ejection fraction, by estimation, is 60 to 65%. The left ventricle has normal function. Left ventricular endocardial border not optimally defined to evaluate regional wall motion. There is moderate concentric left ventricular hypertrophy. Cannot assess LV diastolic filling due to underlying atrial fibrillation.  2. Right ventricular systolic function is normal. The right ventricular size is normal.  3. The mitral valve is degenerative. No evidence of mitral valve regurgitation. Mild to moderate mitral stenosis. The mean mitral valve gradient is 5.0 mmHg. Severe mitral annular calcification.  4. The aortic valve is normal in structure. Aortic valve regurgitation is not visualized. No aortic stenosis is present.  5. The inferior vena cava  is normal in size with greater than 50% respiratory variability, suggesting right atrial pressure of 3 mmHg.  6. Left atrial size was severely dilated. FINDINGS  Left Ventricle: Left ventricular ejection fraction, by estimation, is 60 to 65%. The left ventricle has normal function. Left ventricular endocardial border not optimally defined to evaluate regional wall motion. Definity contrast agent was given IV to delineate the left ventricular endocardial borders. The left ventricular internal cavity size was normal in size. There is moderate concentric left ventricular hypertrophy. Cannot assess LV diastolic filling due to underlying atrial fibrillation. Right Ventricle: The right ventricular size is normal. No increase in right ventricular wall thickness. Right  ventricular systolic function is normal. Left Atrium: Left atrial size was severely dilated. Right Atrium: Right atrial size was normal in size. Pericardium: There is no evidence of pericardial effusion. Mitral Valve: The mitral valve is degenerative in appearance. Severe mitral annular calcification. No evidence of mitral valve regurgitation. Mild to moderate mitral valve stenosis. MV peak gradient, 9.6 mmHg. The mean mitral valve gradient is 5.0 mmHg. Tricuspid Valve: The tricuspid valve is normal in structure. Tricuspid valve regurgitation is not demonstrated. No evidence of tricuspid stenosis. Aortic Valve: The aortic valve is normal in structure. Aortic valve regurgitation is not visualized. No aortic stenosis is present. Pulmonic Valve: The pulmonic valve was normal in structure. Pulmonic valve regurgitation is not visualized. No evidence of pulmonic stenosis. Aorta: The aortic root is normal in size and structure. Venous: The inferior vena cava is normal in size with greater than 50% respiratory variability, suggesting right atrial pressure of 3 mmHg. IAS/Shunts: No atrial level shunt detected by color flow Doppler.  LEFT VENTRICLE PLAX 2D LVIDd:          4.10 cm LVIDs:         2.30 cm LV PW:         1.00 cm LV IVS:        0.90 cm LVOT diam:     2.20 cm LV SV:         53 LV SV Index:   29 LVOT Area:     3.80 cm  LEFT ATRIUM           Index LA diam:      5.20 cm 2.84 cm/m LA Vol (A4C): 89.9 ml 49.03 ml/m  AORTIC VALVE LVOT Vmax:   76.25 cm/s LVOT Vmean:  48.450 cm/s LVOT VTI:    0.138 m MITRAL VALVE MV Peak grad: 9.6 mmHg  SHUNTS MV Mean grad: 5.0 mmHg  Systemic VTI:  0.14 m MV Vmax:      1.55 m/s  Systemic Diam: 2.20 cm MV Vmean:     86.6 cm/s Fransico Him MD Electronically signed by Fransico Him MD Signature Date/Time: 01/28/2020/1:22:36 PM    Final    US Abdomen Limited RUQ  Result Date: 01/28/2020 CLINICAL DATA:  Choledocholithiasis. EXAM: ULTRASOUND ABDOMEN LIMITED RIGHT UPPER QUADRANT COMPARISON:  CT scan January 28, 2020 FINDINGS: Gallbladder: Surgically absent Common bile duct: Diameter: 13.3 mm Intrahepatic ductal dilatation is noted as well as extrahepatic ductal dilatation. No discrete filling defects are seen. Liver: Increased echogenicity with no focal mass. Portal vein is patent on color Doppler imaging with normal direction of blood flow towards the liver. Other: None. IMPRESSION: 1. Intra and extrahepatic biliary duct dilatation, as seen on the CT scan from earlier today. The discrete filling defects in the common bile duct seen on CT imaging today are not visualized on ultrasound. ERCP or MRCP could better evaluate as clinically warranted. Electronically Signed   By: Dorise Bullion III M.D   On: 01/28/2020 13:01     Active Problems:   Sepsis ([place])   Shock ([place])   Abnormal liver diagnostic imaging   Choledocholithiasis     LOS: 2 days   Tye Savoy ,NP 01/30/2020, 10:29 AM

## 2020-01-30 NOTE — Plan of Care (Signed)
  Problem: Education: Goal: Knowledge of General Education information will improve Description: Including pain rating scale, medication(s)/side effects and non-pharmacologic comfort measures Outcome: Progressing   Problem: Health Behavior/Discharge Planning: Goal: Ability to manage health-related needs will improve Outcome: Progressing   Problem: Coping: Goal: Level of anxiety will decrease Outcome: Progressing   Problem: Elimination: Goal: Will not experience complications related to bowel motility Outcome: Progressing   Problem: Pain Managment: Goal: General experience of comfort will improve Outcome: Progressing   Problem: Skin Integrity: Goal: Risk for impaired skin integrity will decrease Outcome: Progressing Multiple skin tears and bruises noted on patient's skin. Wound care consulted.

## 2020-01-31 DIAGNOSIS — R932 Abnormal findings on diagnostic imaging of liver and biliary tract: Secondary | ICD-10-CM | POA: Diagnosis not present

## 2020-01-31 DIAGNOSIS — E162 Hypoglycemia, unspecified: Secondary | ICD-10-CM | POA: Diagnosis not present

## 2020-01-31 DIAGNOSIS — W19XXXA Unspecified fall, initial encounter: Secondary | ICD-10-CM | POA: Diagnosis not present

## 2020-01-31 DIAGNOSIS — K8050 Calculus of bile duct without cholangitis or cholecystitis without obstruction: Secondary | ICD-10-CM | POA: Diagnosis not present

## 2020-01-31 LAB — CBC
HCT: 32 % — ABNORMAL LOW (ref 39.0–52.0)
Hemoglobin: 10.3 g/dL — ABNORMAL LOW (ref 13.0–17.0)
MCH: 30.9 pg (ref 26.0–34.0)
MCHC: 32.2 g/dL (ref 30.0–36.0)
MCV: 96.1 fL (ref 80.0–100.0)
Platelets: 139 10*3/uL — ABNORMAL LOW (ref 150–400)
RBC: 3.33 MIL/uL — ABNORMAL LOW (ref 4.22–5.81)
RDW: 13.8 % (ref 11.5–15.5)
WBC: 6.3 10*3/uL (ref 4.0–10.5)
nRBC: 0 % (ref 0.0–0.2)

## 2020-01-31 LAB — GLUCOSE, CAPILLARY
Glucose-Capillary: 94 mg/dL (ref 70–99)
Glucose-Capillary: 116 mg/dL — ABNORMAL HIGH (ref 70–99)
Glucose-Capillary: 120 mg/dL — ABNORMAL HIGH (ref 70–99)
Glucose-Capillary: 119 mg/dL — ABNORMAL HIGH (ref 70–99)

## 2020-01-31 LAB — COMPREHENSIVE METABOLIC PANEL
ALT: 14 U/L (ref 0–44)
AST: 23 U/L (ref 15–41)
Albumin: 2.7 g/dL — ABNORMAL LOW (ref 3.5–5.0)
Alkaline Phosphatase: 48 U/L (ref 38–126)
Anion gap: 8 (ref 5–15)
BUN: 15 mg/dL (ref 8–23)
CO2: 22 mmol/L (ref 22–32)
Calcium: 8.3 mg/dL — ABNORMAL LOW (ref 8.9–10.3)
Chloride: 110 mmol/L (ref 98–111)
Creatinine, Ser: 1.44 mg/dL — ABNORMAL HIGH (ref 0.61–1.24)
GFR calc Af Amer: 49 mL/min — ABNORMAL LOW (ref >60)
GFR calc non Af Amer: 42 mL/min — ABNORMAL LOW (ref >60)
Glucose, Bld: 107 mg/dL — ABNORMAL HIGH (ref 70–99)
Potassium: 4.2 mmol/L (ref 3.5–5.1)
Sodium: 140 mmol/L (ref 135–145)
Total Bilirubin: 1.5 mg/dL — ABNORMAL HIGH (ref 0.3–1.2)
Total Protein: 5.9 g/dL — ABNORMAL LOW (ref 6.5–8.1)

## 2020-01-31 LAB — HEPARIN LEVEL (UNFRACTIONATED): Heparin Unfractionated: 0.32 IU/mL (ref 0.30–0.70)

## 2020-01-31 NOTE — Progress Notes (Signed)
Patient refused morning labs.

## 2020-01-31 NOTE — Progress Notes (Signed)
  Speech Language Pathology Treatment: Dysphagia  Patient Details Name: Eric [name] MRN: [id number] DOB: 1927-07-31 Today's Date: 01/31/2020 Time: [id number] SLP Time Calculation (min) (ACUTE ONLY): 18 min  Assessment / Plan / Recommendation Clinical Impression  Pt upright in bed, observed with thin liquids and soft solids. Pt is very talkative and easily distracted while eating. He required Mod cues to sustain attention to PO trials throughout the session. He was also noted with immediate and delayed throat clearing suspecting that the pt was not fully masticating the bolus prior to swallowing due to the amount of talking. Pt remains appropriate for the current diet, with meds crushed in puree. Recommend set up (ensuring the pt is following strict aspiration precautions), and intermittent supervision for meals to reduce distractions. Will continue to follow for diet tolerance.    HPI HPI: 53yM with history of COPD, AF on eliquis, CHF, IDDM who was admitted after unwitnessed fall at nursing home, initial glucose at the time 20. Here found to be hypothermic, hypotensive, hypoglycemic, concern for septic shock unclear source      SLP Plan  Continue with current plan of care       Recommendations  Diet recommendations: Dysphagia 3 (mechanical soft);Thin liquid Liquids provided via: Straw;Cup Medication Administration: Crushed with puree Supervision: Intermittent supervision to cue for compensatory strategies Compensations: Minimize environmental distractions;Slow rate;Small sips/bites Postural Changes and/or Swallow Maneuvers: Seated upright 90 degrees                Oral Care Recommendations: Oral care BID Follow up Recommendations: Skilled Nursing facility SLP Visit Diagnosis: Dysphagia, unspecified (R13.10) Plan: Continue with current plan of care       GO               Aline August, Student SLP Office: [phone]  01/31/2020, 12:11 PM

## 2020-01-31 NOTE — Plan of Care (Signed)
  Problem: Clinical Measurements: Goal: Will remain free from infection Outcome: Progressing   Problem: Coping: Goal: Level of anxiety will decrease Outcome: Progressing   Problem: Pain Managment: Goal: General experience of comfort will improve Outcome: Progressing   

## 2020-01-31 NOTE — Plan of Care (Signed)

## 2020-01-31 NOTE — Progress Notes (Signed)
ANTICOAGULATION CONSULT NOTE  Pharmacy Consult for heparin Indication: atrial fibrillation  Patient Measurements: Height: 5\' 9"  (175.3 cm) Weight: 69.4 kg (153 lb) IBW/kg (Calculated) : 70.7 Heparin Dosing Weight: 68.5kg  Vital Signs: Temp: 98.1 F (36.7 C) (04/13 0426) Temp Source: Oral (04/13 0426) BP: 128/79 (04/13 0426) Pulse Rate: 72 (04/13 0426)  Labs: Recent Labs    01/28/20 0031 01/28/20 0031 01/28/20 0037 01/28/20 0705 01/28/20 1931 01/29/20 0424 01/30/20 0456 01/30/20 1047  HGB 11.9*   < > 11.9* 11.9*  --   --   --   --   HCT 37.7*  --  35.0* 37.3*  --   --   --   --   PLT 155  --   --  159  --   --   --   --   APTT  --   --   --  31   < > 96* 50* 73*  LABPROT 14.3  --   --   --   --   --   --   --   INR 1.1  --   --   --   --   --   --   --   HEPARINUNFRC  --   --   --  0.87*   < > 0.75* 0.31 0.33  CREATININE 1.74*   < > 1.70* 1.45*  --  1.43*  --   --    < > = values in this interval not displayed.   Estimated Creatinine Clearance: 33 mL/min (A) (by C-G formula based on SCr of 1.43 mg/dL (H)).   Assessment: 86 year old on Eliquis prior to admission for atrial fibrillation (CHADS2VASc = 5; last dose 4/9 at 2000) now with concern for possible choledocholithiasis. Pharmacy consulted to transition to IV Heparin while work-up ongoing.  Patient has multiple skin tears and bruises on bilateral upper arms wrapped with gauze, kerlix, abdominal pads and tape, will target lower goal level.   Heparin level this morning was delayed due to the patient refusing AM labs however when drawn resulted as therapeutic (HL 0.32 << 0.33, goal of 0.3-0.5). Hgb/Hct slight drop - no active bleeding noted, will monitor.    Goal of Therapy:  Heparin Level 0.3- 0.5 units/ml  APTT 66-102 sec Monitor platelets by anticoagulation protocol: Yes   Plan:  - Continue Heparin at 850 units/hr (8.5 ml/hr) - Will continue to monitor for any signs/symptoms of bleeding and will follow up  with heparin level in the a.m.   Thank you for allowing pharmacy to be a part of this patient's care.  Alycia Rossetti, PharmD, BCPS Clinical Pharmacist Clinical phone for 01/31/2020: [phone] 01/31/2020 10:15 AM   **Pharmacist phone directory can now be found on [email or web address] ([email or web address]).  Listed under [place].

## 2020-01-31 NOTE — Progress Notes (Signed)
[facility] PROGRESS NOTE    Progress Note  Eric [name]  [id number] DOB: June 29, 1927 DOA: 01/28/2020 PCP: Gelene Mink, FNP     Brief Narrative:   Eric [name] is an 86 y.o. male who resides in assisted living facility was found on the ground after an unwitnessed fall by staff, with past medical history of atrial fibrillation, Eliquis heart failure, COPD dementia, diabetes mellitus type 2 in the emergency room he was found to be hypertensive treated with IV fluids started empirically on IV vancomycin, Flagyl and cefepime.  With a mild ovation lactic acid white blood cell count of 12 and UA rare bacteria.  CT of the C-spine showed no acute findings chest x-ray shows no infiltrates. PCCM was consulted for admission as he was started on peripherally on Levophed.  Assessment/Plan:   Severe Sepsis of unclear source:  MRCP was done that showed 2 filling defect within the common bile duct consistent with choledocholithiasis, distal stone larger and expanding the duct  GI recommended an MRCP they have been able unable to contact the family, physician is scheduled for ERCP on 02/01/2020.  Will hold heparin 4 hours prior to procedure.  Acute encephalopathy/unwitnessed fall: Ddid improve with correction of hypoglycemia, CT scan showed no acute findings.  Now resolved likely due to hypoglycemia.  Acute kidney injury: Baseline, on admission 1.7. Likely prerenal azotemia. Metabolic panel is pending at the time of this dictation.  Uncontrolled diabetes mellitus type 2: Currently on sliding scale.  Chronic atrial fibrillation: Eliquis has been held heart rate is less than 100, he has been transitioned to IV heparin for the possibility of an ERCP. He did refuse labs this morning initially but now he has agreed to it, will continue IV heparin 4 hours until procedure.  Pressure injury of skin  RN Pressure Injury Documentation: Pressure Injury 01/28/20 Sacrum Posterior;Mid Stage 2  -  Partial thickness loss of dermis presenting as a shallow open injury with a red, pink wound bed without slough. 6cm x 5cm red, nonblanchable area w/ 1cm x 0.1cm open slit (Active)  01/28/20 0645  Location: Sacrum  Location Orientation: Posterior;Mid  Staging: Stage 2 -  Partial thickness loss of dermis presenting as a shallow open injury with a red, pink wound bed without slough.  Wound Description (Comments): 6cm x 5cm red, nonblanchable area w/ 1cm x 0.1cm open slit  Present on Admission: Yes     Pressure Injury 01/28/20 Heel Left;Posterior Stage 1 -  Intact skin with non-blanchable redness of a localized area usually over a bony prominence. 3cm x 2cm red nonblanchable area (Active)  01/28/20 0645  Location: Heel  Location Orientation: Left;Posterior  Staging: Stage 1 -  Intact skin with non-blanchable redness of a localized area usually over a bony prominence.  Wound Description (Comments): 3cm x 2cm red nonblanchable area  Present on Admission: Yes    Estimated body mass index is 22.59 kg/m as calculated from the following:   Height as of this encounter: 5\' 9"  (1.753 m).   Weight as of this encounter: 69.4 kg.   DVT prophylaxis: Heparin Family Communication:Daughter Status is: Inpatient  Remains inpatient appropriate because:Hemodynamically unstable   Dispo: The patient is from: SNF              Anticipated d/c is to: SNF              Anticipated d/c date is: 3 days              Patient  currently is not medically stable to d/c.         Code Status:     Code Status Orders  (From admission, onward)         Start     Ordered   01/28/20 0617  Do not attempt resuscitation (DNR)  Continuous    Question Answer Comment  In the event of cardiac or respiratory ARREST Do not call a "code blue"   In the event of cardiac or respiratory ARREST Do not perform Intubation, CPR, defibrillation or ACLS   In the event of cardiac or respiratory ARREST Use medication by any  route, position, wound care, and other measures to relive pain and suffering. May use oxygen, suction and manual treatment of airway obstruction as needed for comfort.      01/28/20 0616        Code Status History    Date Active Date Inactive Code Status Order ID Comments User Context   01/28/2020 0518 01/28/2020 0616 Full Code [id number]  Gleason, [facility]   Advance Care Planning Activity        IV Access:    Peripheral IV   Procedures and diagnostic studies:   No results found.   Medical Consultants:    None.  Anti-Infectives:   Cefepime  Subjective:    Eric [name] is nonverbal this morning.  Objective:    Vitals:   01/30/20 0835 01/30/20 1652 01/30/20 2030 01/31/20 0426  BP: 119/62 121/68 130/68 128/79  Pulse: 78 78 82 72  Resp: 16 16 16 16   Temp: 98.6 F (37 C) 98.3 F (36.8 C) 98.6 F (37 C) 98.1 F (36.7 C)  TempSrc:  Oral Oral Oral  SpO2: 97% 97% 98% 99%  Weight:      Height:       SpO2: 99 %   Intake/Output Summary (Last 24 hours) at 01/31/2020 1030 Last data filed at 01/31/2020 0426 Gross per 24 hour  Intake 562 ml  Output 2450 ml  Net -1888 ml   Filed Weights   01/28/20 0012 01/28/20 0659 01/29/20 0600  Weight: 81.6 kg 68.5 kg 69.4 kg    Exam: General exam: In no acute distress. Respiratory system: Good air movement and clear to auscultation. Cardiovascular system: S1 & S2 heard, RRR. No JVD. Gastrointestinal system: Abdomen is nondistended, soft and nontender.  Extremities: No pedal edema. Skin: No rashes, lesions or ulcers  Data Reviewed:    Labs: Basic Metabolic Panel: Recent Labs  Lab 01/28/20 0031 01/28/20 0031 01/28/20 0037 01/28/20 0037 01/28/20 0705 01/29/20 0424  NA 141  --  141  --  137 138  K 4.4   < > 4.1   < > 4.5 4.4  CL 109  --  107  --  109 109  CO2 23  --   --   --  19* 22  GLUCOSE 105*  --  100*  --  238* 160*  BUN 41*  --  40*  --  35* 28*  CREATININE 1.74*  --  1.70*  --  1.45*  1.43*  CALCIUM 9.3  --   --   --  8.4* 8.2*  MG  --   --   --   --  1.7 1.5*  PHOS  --   --   --   --  1.7* 2.4*   < > = values in this interval not displayed.   GFR Estimated Creatinine Clearance: 33 mL/min (A) (by C-G formula based  on SCr of 1.43 mg/dL (H)). Liver Function Tests: Recent Labs  Lab 01/28/20 0031 01/29/20 0424  AST 26 24  ALT 14 13  ALKPHOS 58 49  BILITOT 0.6 0.6  PROT 6.6 5.5*  ALBUMIN 3.6 2.9*   No results for input(s): LIPASE, AMYLASE in the last 168 hours. No results for input(s): AMMONIA in the last 168 hours. Coagulation profile Recent Labs  Lab 01/28/20 0031  INR 1.1   COVID-19 Labs  No results for input(s): DDIMER, FERRITIN, LDH, CRP in the last 72 hours.  Lab Results  Component Value Date   [place] NEGATIVE 01/28/2020    CBC: Recent Labs  Lab 01/28/20 0031 01/28/20 0037 01/28/20 0705  WBC 12.4*  --  9.2  HGB 11.9* 11.9* 11.9*  HCT 37.7* 35.0* 37.3*  MCV 99.2  --  97.6  PLT 155  --  159   Cardiac Enzymes: No results for input(s): CKTOTAL, CKMB, CKMBINDEX, TROPONINI in the last 168 hours. BNP (last 3 results) No results for input(s): PROBNP in the last 8760 hours. CBG: Recent Labs  Lab 01/30/20 0632 01/30/20 1150 01/30/20 1625 01/30/20 2100 01/31/20 0640  GLUCAP 127* 130* 138* 208* 94   D-Dimer: No results for input(s): DDIMER in the last 72 hours. Hgb A1c: No results for input(s): HGBA1C in the last 72 hours. Lipid Profile: No results for input(s): CHOL, HDL, LDLCALC, TRIG, CHOLHDL, LDLDIRECT in the last 72 hours. Thyroid function studies: No results for input(s): TSH, T4TOTAL, T3FREE, THYROIDAB in the last 72 hours.  Invalid input(s): FREET3 Anemia work up: No results for input(s): VITAMINB12, FOLATE, FERRITIN, TIBC, IRON, RETICCTPCT in the last 72 hours. Sepsis Labs: Recent Labs  Lab 01/28/20 0031 01/28/20 0144 01/28/20 0705  WBC 12.4*  --  9.2  LATICACIDVEN  --  2.1* 1.2   Microbiology Recent Results  (from the past 240 hour(s))  Blood culture (routine x 2)     Status: None (Preliminary result)   Collection Time: 01/28/20 12:27 AM   Specimen: BLOOD  Result Value Ref Range Status   Specimen Description BLOOD LEFT ARM  Final   Special Requests   Final    BOTTLES DRAWN AEROBIC ONLY Blood Culture results may not be optimal due to an inadequate volume of blood received in culture bottles   Culture   Final    NO GROWTH 3 DAYS Performed at [facility], [place] [address], [place], [place] [address]    Report Status PENDING  Incomplete  Blood culture (routine x 2)     Status: None (Preliminary result)   Collection Time: 01/28/20 12:32 AM   Specimen: BLOOD  Result Value Ref Range Status   Specimen Description BLOOD RIGHT FOREARM  Final   Special Requests   Final    BOTTLES DRAWN AEROBIC AND ANAEROBIC Blood Culture results may not be optimal due to an inadequate volume of blood received in culture bottles   Culture   Final    NO GROWTH 3 DAYS Performed at [facility], [place] [address] [place], [place] [address]    Report Status PENDING  Incomplete  SARS CORONAVIRUS 2 (TAT 6-24 HRS) Nasopharyngeal Nasopharyngeal Swab     Status: None   Collection Time: 01/28/20  2:23 AM   Specimen: Nasopharyngeal Swab  Result Value Ref Range Status   SARS Coronavirus 2 NEGATIVE NEGATIVE Final    Comment: (NOTE) SARS-CoV-2 target nucleic acids are NOT DETECTED. The SARS-CoV-2 RNA is generally detectable in upper and lower respiratory specimens  during the acute phase of infection. Negative results do not preclude SARS-CoV-2 infection, do not rule out co-infections with other pathogens, and should not be used as the sole basis for treatment or other patient management decisions. Negative results must be combined with clinical observations, patient history, and epidemiological information. The expected result is Negative. Fact Sheet for Patients: SugarRoll.be Fact  Sheet for Healthcare Providers: https://www.woods-mathews.com/ This test is not yet approved or cleared by the [place] FDA and  has been authorized for detection and/or diagnosis of SARS-CoV-2 by FDA under an Emergency Use Authorization (EUA). This EUA will remain  in effect (meaning this test can be used) for the duration of the COVID-19 declaration under Section 56 4(b)(1) of the Act, 21 U.S.C. section 360bbb-3(b)(1), unless the authorization is terminated or revoked sooner. Performed at [facility], [place] [address], [place], [place] [address]   MRSA PCR Screening     Status: None   Collection Time: 01/28/20  7:41 AM   Specimen: Nasal Mucosa; Nasopharyngeal  Result Value Ref Range Status   MRSA by PCR NEGATIVE NEGATIVE Final    Comment:        The GeneXpert MRSA Assay (FDA approved for NASAL specimens only), is one component of a comprehensive MRSA colonization surveillance program. It is not intended to diagnose MRSA infection nor to guide or monitor treatment for MRSA infections. Performed at [facility], [place] [address] [place], [place] [address]      Medications:   . Chlorhexidine Gluconate Cloth  6 each Topical Q0600  . insulin aspart  0-15 Units Subcutaneous TID WC  . insulin aspart  0-5 Units Subcutaneous QHS  . revefenacin  175 mcg Nebulization Daily   Continuous Infusions: . sodium chloride Stopped (01/29/20 1214)  . ceFEPime (MAXIPIME) IV 2 g (01/31/20 0900)  . heparin 850 Units/hr (01/30/20 2252)  . lactated ringers 75 mL/hr at 01/29/20 2150  . sodium chloride Stopped (01/28/20 0405)      LOS: 3 days   Charlynne Cousins  [facility]  01/31/2020, 10:30 AM

## 2020-01-31 NOTE — Progress Notes (Signed)
I spoke at length with daughter Guerry Minors regarding choledocholithiasis. We discussed the risks / benefits of proceeding with an ERCP. I did explain that source of sepsis is unclear but we are not convinced that it biliary in origin but rather cbd stones were incidental findings.  I explained that the gallstones could at some point lead to cholangitis.  He can also spontaneously passed the stones at some point or they get remain there without consequences.  Daughter has concerns about patient being intubated.  He is currently a DNI.  She feels he is frail and has concerns about proceeding with ERCP so at this point we will hold off.   Upon discharge, the nursing home could obtain hepatic function panel sometime in the next couple of weeks.  If abnormal then happy to see in the office for further evaluation if family desires

## 2020-02-01 ENCOUNTER — Inpatient Hospital Stay (HOSPITAL_COMMUNITY): Payer: Medicare Other

## 2020-02-01 ENCOUNTER — Encounter (HOSPITAL_COMMUNITY)
Admission: EM | Disposition: A | Discharge status: Skilled Nursing Facility | Payer: Self-pay | Source: Skilled Nursing Facility | Attending: Student

## 2020-02-01 DIAGNOSIS — A419 Sepsis, unspecified organism: Secondary | ICD-10-CM | POA: Diagnosis not present

## 2020-02-01 DIAGNOSIS — K8050 Calculus of bile duct without cholangitis or cholecystitis without obstruction: Secondary | ICD-10-CM | POA: Diagnosis not present

## 2020-02-01 DIAGNOSIS — R932 Abnormal findings on diagnostic imaging of liver and biliary tract: Secondary | ICD-10-CM | POA: Diagnosis not present

## 2020-02-01 DIAGNOSIS — L89100 Pressure ulcer of unspecified part of back, unstageable: Secondary | ICD-10-CM | POA: Diagnosis not present

## 2020-02-01 LAB — URINE CULTURE: Culture: NO GROWTH

## 2020-02-01 LAB — GLUCOSE, CAPILLARY
Glucose-Capillary: 130 mg/dL — ABNORMAL HIGH (ref 70–99)
Glucose-Capillary: 130 mg/dL — ABNORMAL HIGH (ref 70–99)
Glucose-Capillary: 155 mg/dL — ABNORMAL HIGH (ref 70–99)
Glucose-Capillary: 159 mg/dL — ABNORMAL HIGH (ref 70–99)

## 2020-02-01 LAB — HEPARIN LEVEL (UNFRACTIONATED): Heparin Unfractionated: 0.22 IU/mL — ABNORMAL LOW (ref 0.30–0.70)

## 2020-02-01 SURGERY — ERCP, WITH INTERVENTION IF INDICATED
Anesthesia: General

## 2020-02-01 MED ORDER — LORAZEPAM 2 MG/ML IJ SOLN
0.5000 mg | Freq: Once | INTRAMUSCULAR | Status: AC
  Administered 2020-02-01: 0.5 mg via INTRAVENOUS
  Filled 2020-02-01: qty 1

## 2020-02-01 NOTE — Progress Notes (Signed)
ANTICOAGULATION CONSULT NOTE  Pharmacy Consult for heparin Indication: atrial fibrillation  Patient Measurements: Height: 5\' 9"  (175.3 cm) Weight: 69.4 kg (153 lb) IBW/kg (Calculated) : 70.7 Heparin Dosing Weight: 68.5kg  Vital Signs: Temp: 98.2 F (36.8 C) (04/14 0732) Temp Source: Oral (04/14 0732) BP: 134/72 (04/14 0732) Pulse Rate: 88 (04/14 0732)  Labs: Recent Labs    01/28/20 0031 01/28/20 0031 01/28/20 0037 01/28/20 0705 01/28/20 1931 01/29/20 0424 01/30/20 0456 01/30/20 1047  HGB 11.9*   < > 11.9* 11.9*  --   --   --   --   HCT 37.7*  --  35.0* 37.3*  --   --   --   --   PLT 155  --   --  159  --   --   --   --   APTT  --   --   --  31   < > 96* 50* 73*  LABPROT 14.3  --   --   --   --   --   --   --   INR 1.1  --   --   --   --   --   --   --   HEPARINUNFRC  --   --   --  0.87*   < > 0.75* 0.31 0.33  CREATININE 1.74*   < > 1.70* 1.45*  --  1.43*  --   --    < > = values in this interval not displayed.   Estimated Creatinine Clearance: 32.8 mL/min (A) (by C-G formula based on SCr of 1.44 mg/dL (H)).   Assessment: 86 year old on Eliquis prior to admission for atrial fibrillation (CHADS2VASc = 5; last dose 4/9 at 2000) now with concern for possible choledocholithiasis. Pharmacy consulted to transition to IV Heparin while work-up ongoing.  Patient has multiple skin tears and bruises on bilateral upper arms wrapped with gauze, kerlix, abdominal pads and tape, will target lower goal level. No active bleeding.   Heparin level low this morning. No issues with infusion, heparin is running. Cancelled ERCP today after discussion with daughter.   Goal of Therapy:  Heparin Level 0.3- 0.5 units/ml  Monitor platelets by anticoagulation protocol: Yes   Plan:  Increase Heparin to 900 units/hr Monitor daily HL, CBC/plt Monitor for signs/symptoms of bleeding  F/u restart apixaban, consider dose reduction to 2.5mg  BID given extensive skin tears and bruising on both arms   HL in 8hr if apixaban is not restarted    Thank you for allowing pharmacy to be a part of this patient's care.  Benetta Spar, PharmD, BCPS, BCCP Clinical Pharmacist  Please check AMION for all [place] phone numbers After 10:00 PM, call [place] [phone]

## 2020-02-01 NOTE — Progress Notes (Signed)
Brief GI progress note  Was able to reach the patient's daughter and talk with her in detail about the patient's CT and MRI findings concerning for choledocholithiasis.  When liver tests were last checked there was no evidence of liver test abnormalities.  She is very concerned about the risks inherent with an ERCP that we went over.  She wants to minimize procedures as much as possible.  Although I do not normally let large bile duct stones remain in place, there is no doubt that the stones have been inside Eric [name] bile duct for a while.  If he begins to have LFT abnormalities we really need to strongly consider an ERCP.  The patient's daughter understands the risk of gallstone pancreatitis, cholangitis, significant morbidity that can occur in the setting of obstruction from bile duct stones but she wants to hold on it for now.  The patient's daughter is okay with the patient just being monitored through his facility and does not need or require GI follow-up unless there are changes in his liver test as an outpatient. We will respect her wishes. No ERCP at this time.  Eric Britain, MD [facility] Office # [phone]

## 2020-02-01 NOTE — Progress Notes (Signed)
Pharmacy Antibiotic Note  Eric [name] is a 86 y.o. male admitted on 01/28/2020 with sepsis and choledocholelithiasis Pharmacy has been consulted for cefepime dosing.  Holding off on GI procedure at this time. No new BMP, last Scr stable x3. ClCr is right above cut off for dose reduction. Patient tolerating well. Spoke with MD, ok to stop cefepime after today   Plan: Stop cefepime 2g Q12hr after dose tonight per MD   Height: 5\' 9"  (175.3 cm) Weight: 69.4 kg (153 lb) IBW/kg (Calculated) : 70.7  Temp (24hrs), Avg:98.4 F (36.9 C), Min:98.2 F (36.8 C), Max:98.7 F (37.1 C)  Recent Labs  Lab 01/28/20 0031 01/28/20 0037 01/28/20 0144 01/28/20 0705 01/29/20 0424 01/31/20 1044 01/31/20 1053  WBC 12.4*  --   --  9.2  --  6.3  --   CREATININE 1.74* 1.70*  --  1.45* 1.43*  --  1.44*  LATICACIDVEN  --   --  2.1* 1.2  --   --   --     Estimated Creatinine Clearance: 32.8 mL/min (A) (by C-G formula based on SCr of 1.44 mg/dL (H)).     Antimicrobials this admission: 4/9 Vanc >> 4/10 4/9 Cefepime >> 4/14 4/9 Flagyl x1  Microbiology results: 4/10 COVID >> neg 4/10 MRSA pcr negative 4/10 UCx >> sent 4/10 BCx >> ngtd x1d  Thank you for allowing pharmacy to be a part of this patient's care.  Benetta Spar, PharmD, BCPS, BCCP Clinical Pharmacist  Please check AMION for all [place] phone numbers After 10:00 PM, call [place] [phone]

## 2020-02-01 NOTE — Progress Notes (Signed)
PROGRESS NOTE    Eric [name]  [id number] DOB: 1927/01/11 DOA: 01/28/2020 PCP: Gelene Mink, FNP    Brief Narrative:  Eric [name] is an 86 y.o. male with past medical history of atrial fibrillation on Eliquis, chronic diastolic congestive heart failure, COPD, dementia, diabetes mellitus type 2 who resides at [facility] assisted living facility was found on the ground after an unwitnessed fall by staff.  In the emergency room he was found to be hypertensive treated with IV fluids started empirically on IV vancomycin, Flagyl and cefepime.  With a mild elevation in lactic acid, white blood cell count of 12 and UA rare bacteria.  CT of the C-spine showed no acute findings chest x-ray shows no infiltrates. PCCM was consulted for admission as he was started on peripherally on Levophed.   Assessment & Plan:   Active Problems:   Sepsis ([place])   Shock ([place])   Abnormal liver diagnostic imaging   Choledocholithiasis   Pressure injury of skin   Severe Sepsis of unclear source; present on admission Patient presenting from assisted living after being found down with underlying hypertension.  No acute cardiopulmonary disease process.  Urinalysis unremarkable.  CT abdomen/pelvis notable for 2 discrete filling defects within CBD worrisome for choledocholithiasis, large colonic stool burden, unchanged infrarenal abdominal aortic aneurysm, otherwise unremarkable.  Initially started on broad-spectrum antibiotics with vancomycin, Flagyl, cefepime; which was deescalated to cefepime alone.  Initially requiring ICU level of care with initiation of vasopressors with Levophed, which was slowly weaned off. --Blood cultures x2: No growth x3 days --Continue cefepime, day #5 --Repeat CBC in a.m.  Choledocholithiasis CT abdomen/pelvis notable for status post cholecystectomy but 2 discrete filling defects within the common bile duct worrisome for choledocholithiasis.  Right upper quadrant notable for  intra and extrahepatic biliary duct dilation.  MRCP with 2 filling defects within the common bile duct consistent with choledocholithiasis, distal stone large and expands the duct, also mild dilation of the distal pancreatic duct, no evidence of pancreatitis. --[facility] following, appreciate assistance --Plan was for possible ERCP, daughter hesitant given patient's advanced age and improvement with medical treatment, GI appears to be deferring ERCP at this time with recommendations of outpatient follow-up with repeat CMP in 2 weeks.  Acute encephalopathy/unwitnessed fall: Did improve with correction of hypoglycemia, CT scan showed no acute findings.  Now resolved likely due to hypoglycemia. --New fall/dementia precautions  Acute kidney injury: Randon 1.74 on admission, likely prerenal azotemia from dehydration.  Improved to 1.44 with IV fluid hydration. --Repeat CMP in the a.m.  Type 2 diabetes mellitus: Hemoglobin A1c 6.6, well controlled. On Lantus 24 units subcutaneously daily at home.  Was hypoglycemic on arrival, likely etiology of his fall. --Continue insulin sliding scale for coverage while inpatient; did not receive any coverage yesterday, 4 units today so far --Likely to discontinue Lantus on discharge  COPD Not oxygen dependent.  On Spiriva at home, hospital substitution with Incruse Ellipta while inpatient.  Chronic atrial fibrillation: On apixaban 2.5 mg p.o. twice daily at home.  Daughter reports several falls recently.  May need to consider discontinuation of anticoagulation given his recurrent falls, as risk may outweigh benefit at this point. --Continue to monitor on telemetry  Infrarenal abdominal aortic aneurysm CT abdomen/pelvis notable for unchanged infrarenal AAA measuring 3.4 cm.  Stable since last imaging noted on 01/2016. --Recommend follow-up aortic ultrasound in 3 years  Dysphagia Evaluated by speech therapy, recommended dysphagia 3 diet, mechanical soft  with thin liquids --Aspiration precautions  Pressure  injury of skin; present on admission Pressure Injury 01/28/20 Sacrum Posterior;Mid Stage 2 -  Partial thickness loss of dermis presenting as a shallow open injury with a red, pink wound bed without slough. 6cm x 5cm red, nonblanchable area w/ 1cm x 0.1cm open slit (Active)  01/28/20 0645  Location: Sacrum  Location Orientation: Posterior;Mid  Staging: Stage 2 -  Partial thickness loss of dermis presenting as a shallow open injury with a red, pink wound bed without slough.  Wound Description (Comments): 6cm x 5cm red, nonblanchable area w/ 1cm x 0.1cm open slit  Present on Admission: Yes     Pressure Injury 01/28/20 Heel Left;Posterior Stage 1 -  Intact skin with non-blanchable redness of a localized area usually over a bony prominence. 3cm x 2cm red nonblanchable area (Active)  01/28/20 0645  Location: Heel  Location Orientation: Left;Posterior  Staging: Stage 1 -  Intact skin with non-blanchable redness of a localized area usually over a bony prominence.  Wound Description (Comments): 3cm x 2cm red nonblanchable area  Present on Admission: Yes     DVT prophylaxis: Heparin drip Code Status: DNR Family Communication: Daughter present at bedside  Status is: Inpatient  Remains inpatient appropriate because:Ongoing diagnostic testing needed not appropriate for outpatient work up and Inpatient level of care appropriate due to severity of illness   Dispo: The patient is from: [facility]              Anticipated d/c is to: [facility]              Anticipated d/c date is: 1 day              Patient currently is not medically stable to d/c.  Awaiting sign off from gastroenterology with final recommendations.    Consultants:   [facility]  Procedures:  TTE 01/28/2020: IMPRESSIONS    1. Left ventricular ejection fraction, by estimation, is 60 to 65%. The  left ventricle has normal function.  Left ventricular endocardial border  not optimally defined to evaluate regional wall motion. There is moderate  concentric left ventricular  hypertrophy. Cannot assess LV diastolic filling due to underlying atrial  fibrillation.  2. Right ventricular systolic function is normal. The right ventricular  size is normal.  3. The mitral valve is degenerative. No evidence of mitral valve  regurgitation. Mild to moderate mitral stenosis. The mean mitral valve  gradient is 5.0 mmHg. Severe mitral annular calcification.  4. The aortic valve is normal in structure. Aortic valve regurgitation is  not visualized. No aortic stenosis is present.  5. The inferior vena cava is normal in size with greater than 50%  respiratory variability, suggesting right atrial pressure of 3 mmHg.  6. Left atrial size was severely dilated.   Antimicrobials:   Cefepime 4/10>>  Vancomycin 4/10 - 4/10  Flagyl 4/10 - 4/10   Subjective: Patient seen and examined at bedside, resting comfortably.  Daughter present.  Patient pleasantly confused, no specific complaints this morning.  Specifically denies abdominal pain.  Concerned regarding multiple skin tears, which is complicated by his anticoagulation.  Daughter also on wary of proceeding with ERCP given his advanced age and frailty.  No other questions or concerns at this time.  Patient denies headache, no chest pain, no palpitations, no shortness of breath, no abdominal pain.  No acute events overnight per nursing staff.  Objective: Vitals:   01/31/20 2005 02/01/20 0309 02/01/20 0732 02/01/20 0815  BP: 131/62 Marland Kitchen)  113/59 134/72   Pulse: 85 83 88   Resp:  17 18   Temp:  98.4 F (36.9 C) 98.2 F (36.8 C)   TempSrc:  Oral Oral   SpO2:  96% 96% 99%  Weight:      Height:        Intake/Output Summary (Last 24 hours) at 02/01/2020 1230 Last data filed at 02/01/2020 1100 Gross per 24 hour  Intake --  Output 1400 ml  Net -1400 ml   Filed Weights   01/28/20  0012 01/28/20 0659 01/29/20 0600  Weight: 81.6 kg 68.5 kg 69.4 kg    Examination:  General exam: Appears calm and comfortable, pleasantly confused Respiratory system: Clear to auscultation. Respiratory effort normal.  Oxygenating well on room air Cardiovascular system: S1 & S2 heard, RRR. No JVD, murmurs, rubs, gallops or clicks. No pedal edema. Gastrointestinal system: Abdomen is nondistended, soft and nontender. No organomegaly or masses felt. Normal bowel sounds heard. Central nervous system: Alert. No focal neurological deficits. Extremities: Symmetric 5 x 5 power. Skin: Multiple areas of ecchymosis, arms wrapped with gauze, no other concerning lesions/rashes appreciated Psychiatry: Judgement and insight appear poor. Mood & affect appropriate.     Data Reviewed: I have personally reviewed following labs and imaging studies  CBC: Recent Labs  Lab 01/28/20 0031 01/28/20 0037 01/28/20 0705 01/31/20 1044  WBC 12.4*  --  9.2 6.3  HGB 11.9* 11.9* 11.9* 10.3*  HCT 37.7* 35.0* 37.3* 32.0*  MCV 99.2  --  97.6 96.1  PLT 155  --  159 [id number]*   Basic Metabolic Panel: Recent Labs  Lab 01/28/20 0031 01/28/20 0037 01/28/20 0705 01/29/20 0424 01/31/20 1053  NA 141 141 137 138 140  K 4.4 4.1 4.5 4.4 4.2  CL 109 107 109 109 110  CO2 23  --  19* 22 22  GLUCOSE 105* 100* 238* 160* 107*  BUN 41* 40* 35* 28* 15  CREATININE 1.74* 1.70* 1.45* 1.43* 1.44*  CALCIUM 9.3  --  8.4* 8.2* 8.3*  MG  --   --  1.7 1.5*  --   PHOS  --   --  1.7* 2.4*  --    GFR: Estimated Creatinine Clearance: 32.8 mL/min (A) (by C-G formula based on SCr of 1.44 mg/dL (H)). Liver Function Tests: Recent Labs  Lab 01/28/20 0031 01/29/20 0424 01/31/20 1053  AST 26 24 23   ALT 14 13 14   ALKPHOS 58 49 48  BILITOT 0.6 0.6 1.5*  PROT 6.6 5.5* 5.9*  ALBUMIN 3.6 2.9* 2.7*   No results for input(s): LIPASE, AMYLASE in the last 168 hours. No results for input(s): AMMONIA in the last 168 hours. Coagulation  Profile: Recent Labs  Lab 01/28/20 0031  INR 1.1   Cardiac Enzymes: No results for input(s): CKTOTAL, CKMB, CKMBINDEX, TROPONINI in the last 168 hours. BNP (last 3 results) No results for input(s): PROBNP in the last 8760 hours. HbA1C: No results for input(s): HGBA1C in the last 72 hours. CBG: Recent Labs  Lab 01/31/20 1202 01/31/20 1604 01/31/20 2002 02/01/20 0647 02/01/20 1146  GLUCAP 116* 120* 119* 130* 130*   Lipid Profile: No results for input(s): CHOL, HDL, LDLCALC, TRIG, CHOLHDL, LDLDIRECT in the last 72 hours. Thyroid Function Tests: No results for input(s): TSH, T4TOTAL, FREET4, T3FREE, THYROIDAB in the last 72 hours. Anemia Panel: No results for input(s): VITAMINB12, FOLATE, FERRITIN, TIBC, IRON, RETICCTPCT in the last 72 hours. Sepsis Labs: Recent Labs  Lab 01/28/20 0144 01/28/20 0705  LATICACIDVEN 2.1*  1.2    Recent Results (from the past 240 hour(s))  Blood culture (routine x 2)     Status: None (Preliminary result)   Collection Time: 01/28/20 12:27 AM   Specimen: BLOOD  Result Value Ref Range Status   Specimen Description BLOOD LEFT ARM  Final   Special Requests   Final    BOTTLES DRAWN AEROBIC ONLY Blood Culture results may not be optimal due to an inadequate volume of blood received in culture bottles   Culture   Final    NO GROWTH 3 DAYS Performed at [facility], [place] [address], [place], [place] [address]    Report Status PENDING  Incomplete  Blood culture (routine x 2)     Status: None (Preliminary result)   Collection Time: 01/28/20 12:32 AM   Specimen: BLOOD  Result Value Ref Range Status   Specimen Description BLOOD RIGHT FOREARM  Final   Special Requests   Final    BOTTLES DRAWN AEROBIC AND ANAEROBIC Blood Culture results may not be optimal due to an inadequate volume of blood received in culture bottles   Culture   Final    NO GROWTH 3 DAYS Performed at [facility], [place] [address], [place], [place] [address]    Report  Status PENDING  Incomplete  Culture, Urine     Status: None   Collection Time: 01/28/20  1:13 AM   Specimen: Urine, Catheterized  Result Value Ref Range Status   Specimen Description URINE, CATHETERIZED  Final   Special Requests NONE  Final   Culture   Final    NO GROWTH Performed at [facility], [address] [place], [place] [address]    Report Status 02/01/2020 FINAL  Final  SARS CORONAVIRUS 2 (TAT 6-24 HRS) Nasopharyngeal Nasopharyngeal Swab     Status: None   Collection Time: 01/28/20  2:23 AM   Specimen: Nasopharyngeal Swab  Result Value Ref Range Status   SARS Coronavirus 2 NEGATIVE NEGATIVE Final    Comment: (NOTE) SARS-CoV-2 target nucleic acids are NOT DETECTED. The SARS-CoV-2 RNA is generally detectable in upper and lower respiratory specimens during the acute phase of infection. Negative results do not preclude SARS-CoV-2 infection, do not rule out co-infections with other pathogens, and should not be used as the sole basis for treatment or other patient management decisions. Negative results must be combined with clinical observations, patient history, and epidemiological information. The expected result is Negative. Fact Sheet for Patients: SugarRoll.be Fact Sheet for Healthcare Providers: https://www.woods-mathews.com/ This test is not yet approved or cleared by the [place] FDA and  has been authorized for detection and/or diagnosis of SARS-CoV-2 by FDA under an Emergency Use Authorization (EUA). This EUA will remain  in effect (meaning this test can be used) for the duration of the COVID-19 declaration under Section 56 4(b)(1) of the Act, 21 U.S.C. section 360bbb-3(b)(1), unless the authorization is terminated or revoked sooner. Performed at [facility], [place] [address], [place], [place] [address]   MRSA PCR Screening     Status: None   Collection Time: 01/28/20  7:41 AM   Specimen: Nasal Mucosa;  Nasopharyngeal  Result Value Ref Range Status   MRSA by PCR NEGATIVE NEGATIVE Final    Comment:        The GeneXpert MRSA Assay (FDA approved for NASAL specimens only), is one component of a comprehensive MRSA colonization surveillance program. It is not intended to diagnose MRSA infection nor to guide  or monitor treatment for MRSA infections. Performed at [facility], [place] [address] [place], [place] [address]          Radiology Studies: No results found.      Scheduled Meds: . Chlorhexidine Gluconate Cloth  6 each Topical Q0600  . insulin aspart  0-15 Units Subcutaneous TID WC  . insulin aspart  0-5 Units Subcutaneous QHS  . revefenacin  175 mcg Nebulization Daily   Continuous Infusions: . sodium chloride Stopped (01/29/20 1214)  . ceFEPime (MAXIPIME) IV 2 g (02/01/20 1023)  . heparin 900 Units/hr (02/01/20 [id number])  . lactated ringers 75 mL/hr at 01/29/20 2150  . sodium chloride Stopped (01/28/20 0405)     LOS: 4 days    Time spent: 36 minutes spent on chart review, discussion with nursing staff, consultants, updating family and interview/physical exam; more than 50% of that time was spent in counseling and/or coordination of care.    Elic Vencill J [place] DO [facility] Available via Epic secure chat 7am-7pm After these hours, please refer to coverage provider listed on [email or web address] 02/01/2020, 12:30 PM

## 2020-02-01 NOTE — Evaluation (Signed)
Physical Therapy Evaluation Patient Details Name: Eric [name] MRN: [id number] DOB: November 14, 1926 Today's Date: 02/01/2020   History of Present Illness  Pt is a 86 y/o male admitted secondary to sepsis from unclear source. Pt also with choledocholithiasis. PMH includes dementia, DM, COPD, dCHF, and a fib.   Clinical Impression  Pt admitted secondary to problem above with deficits below. Pt very tangential and required redirection to task multiple times. Was only agreeable to sitting up on the side of the bed today. Required mod A for bed mobility tasks. Feel pt is at increased risk for falls and will require increased assist at d/c. However, if ALF able to provide necessary assist, feel pt would be ok to return to ALF with [facility] services. Will continue to follow acutely to maximize functional mobility independence and safety.     Follow Up Recommendations SNF;Supervision/Assistance - 24 hour(unless ALF able to provide necessary assist)    Equipment Recommendations  Other (comment)(TBD)    Recommendations for Other Services       Precautions / Restrictions Precautions Precautions: Fall Precaution Comments: Had fall at [facility] prior to admission.  Restrictions Weight Bearing Restrictions: No      Mobility  Bed Mobility Overal bed mobility: Needs Assistance Bed Mobility: Supine to Sit;Sit to Supine     Supine to sit: Mod assist Sit to supine: Min assist   General bed mobility comments: Mod A for trunk assist and LE assist to come to sitting. Maintained sitting for ~5 mins before requesting to return to supine. Required min guard to min A to maintain balance. Min A for LE assist for return to supine.   Transfers                 General transfer comment: Pt refusing.   Ambulation/Gait                Stairs            Wheelchair Mobility    Modified Rankin (Stroke Patients Only)       Balance Overall balance assessment: Needs  assistance Sitting-balance support: Bilateral upper extremity supported;Feet supported Sitting balance-Leahy Scale: Poor Sitting balance - Comments: Reliant on UE and min to min guard A to maintain sitting balance.                                      Pertinent Vitals/Pain Pain Assessment: No/denies pain    Home Living Family/patient expects to be discharged to:: Assisted living                 Additional Comments: Per notes, pt from [place]. Pt unable to state; when asked where he is from, he states "here"     Prior Function           Comments: Unsure of PLOF as no family present and pt unable to report.      Hand Dominance        Extremity/Trunk Assessment   Upper Extremity Assessment Upper Extremity Assessment: Defer to OT evaluation(BUE bruised and wrapped)    Lower Extremity Assessment Lower Extremity Assessment: Generalized weakness    Cervical / Trunk Assessment Cervical / Trunk Assessment: Kyphotic  Communication   Communication: No difficulties  Cognition Arousal/Alertness: Awake/alert Behavior During Therapy: Restless Overall Cognitive Status: No family/caregiver present to determine baseline cognitive functioning  General Comments: Dementia at baseline. Pt very tangential and required constant redirection to task.       General Comments      Exercises     Assessment/Plan    PT Assessment Patient needs continued PT services  PT Problem List Decreased strength;Decreased activity tolerance;Decreased mobility;Decreased balance;Decreased cognition;Decreased knowledge of use of DME;Decreased safety awareness;Decreased knowledge of precautions       PT Treatment Interventions Gait training;DME instruction;Functional mobility training;Therapeutic activities;Balance training;Neuromuscular re-education;Therapeutic exercise;Cognitive remediation;Patient/family education    PT  Goals (Current goals can be found in the Care Plan section)  Acute Rehab PT Goals PT Goal Formulation: Patient unable to participate in goal setting Time For Goal Achievement: 02/15/20 Potential to Achieve Goals: Fair    Frequency Min 3X/week   Barriers to discharge        Co-evaluation               AM-PAC PT "6 Clicks" Mobility  Outcome Measure Help needed turning from your back to your side while in a flat bed without using bedrails?: A Little Help needed moving from lying on your back to sitting on the side of a flat bed without using bedrails?: A Lot Help needed moving to and from a bed to a chair (including a wheelchair)?: A Lot Help needed standing up from a chair using your arms (e.g., wheelchair or bedside chair)?: A Lot Help needed to walk in hospital room?: A Lot Help needed climbing 3-5 steps with a railing? : A Lot 6 Click Score: 13    End of Session   Activity Tolerance: Patient tolerated treatment well Patient left: in bed;with call bell/phone within reach;with bed alarm set Nurse Communication: Mobility status PT Visit Diagnosis: Muscle weakness (generalized) (M62.81);History of falling (Z91.81);Difficulty in walking, not elsewhere classified (R26.2)    Time: [id number] PT Time Calculation (min) (ACUTE ONLY): 13 min   Charges:   PT Evaluation $PT Eval Moderate Complexity: 1 Mod          Reuel Derby, PT, DPT  Acute Rehabilitation Services  Pager: [phone] Office: [phone]   Rudean Hitt 02/01/2020, 6:17 PM

## 2020-02-01 NOTE — Progress Notes (Signed)
RN paged NP to relate pt is confused and was up trying to get dressed because he thought he was going home. Fell and hit head on bedrail.  Difficult for RN to assess neuro status due to previous confusion.  No bleeding or bruising noted, no complaints of headache at this time.  Pt was on heparin, which was discontinued today.  CT ordered and paged colleague at [facility] to see patient for evaluation.

## 2020-02-01 NOTE — Progress Notes (Signed)
Pt unable to tolerate Covid test (nasal swab) ; will ask nightshift staff  to try again this evening.

## 2020-02-02 DIAGNOSIS — A419 Sepsis, unspecified organism: Secondary | ICD-10-CM | POA: Diagnosis not present

## 2020-02-02 DIAGNOSIS — K8050 Calculus of bile duct without cholangitis or cholecystitis without obstruction: Secondary | ICD-10-CM | POA: Diagnosis not present

## 2020-02-02 DIAGNOSIS — L89100 Pressure ulcer of unspecified part of back, unstageable: Secondary | ICD-10-CM | POA: Diagnosis not present

## 2020-02-02 LAB — GLUCOSE, CAPILLARY
Glucose-Capillary: 132 mg/dL — ABNORMAL HIGH (ref 70–99)
Glucose-Capillary: 138 mg/dL — ABNORMAL HIGH (ref 70–99)

## 2020-02-02 LAB — CULTURE, BLOOD (ROUTINE X 2)
Culture: NO GROWTH
Culture: NO GROWTH

## 2020-02-02 LAB — SARS CORONAVIRUS 2 BY RT PCR (DIASORIN): SARS Coronavirus 2: NEGATIVE

## 2020-02-02 MED ORDER — LANTUS SOLOSTAR 100 UNIT/ML ~~LOC~~ SOPN: 8.0000 [IU] | PEN_INJECTOR | Freq: Every day | SUBCUTANEOUS | 11 refills | Status: DC

## 2020-02-02 NOTE — NC FL2 (Signed)
[place] LEVEL OF CARE SCREENING TOOL     IDENTIFICATION  Patient Name: Eric [name] Birthdate: October 27, 1926 Sex: male Admission Date (Current Location): 01/28/2020  [facility] and [place] Number:  Herbalist and Address:  [facility] [place], [place] [address] [place], [place] [address]      Provider Number: [id number]  Attending Physician Name and Address:  [place] Donnamarie Poag, DO  Relative Name and Phone Number:  [name] ([phone]    Current Level of Care: Hospital Recommended Level of Care: [place] Prior Approval Number:    Date Approved/Denied:   PASRR Number: [id number]  Discharge Plan: SNF    Current Diagnoses: Patient Active Problem List   Diagnosis Date Noted  . Pressure injury of skin 01/30/2020  . Choledocholithiasis   . Sepsis ([place]) 01/28/2020  . Fall   . Hypoglycemia   . Atrial fibrillation ([place])   . Shock ([place])   . Abnormal liver diagnostic imaging     Orientation RESPIRATION BLADDER Height & Weight     Self  Normal Incontinent, External catheter Weight: 69.4 kg Height:  5\' 9"  (175.3 cm)  BEHAVIORAL SYMPTOMS/MOOD NEUROLOGICAL BOWEL NUTRITION STATUS      Continent Diet(refer to d/c summary)  AMBULATORY STATUS COMMUNICATION OF NEEDS Skin   Extensive Assist Verbally Other (Comment)(sacral wound, stage1 and L heel wound stage 1both with dry drsgs,  skin tears noted on L arm)                       Personal Care Assistance Level of Assistance  Feeding, Bathing, Dressing Bathing Assistance: Maximum assistance Feeding assistance: Limited assistance Dressing Assistance: Maximum assistance     Functional Limitations Info  Sight, Hearing, Speech Sight Info: Adequate Hearing Info: Adequate Speech Info: Adequate    SPECIAL CARE FACTORS FREQUENCY  PT (By licensed PT), OT (By licensed OT)     PT Frequency: 5x/week, evaluate and treat OT Frequency: 5x/week, evaluate and  treat            Contractures Contractures Info: Not present    Additional Factors Info  Code Status, Allergies Code Status Info: DNR Allergies Info: Vitamin B12, Ampicillin           Current Medications (02/02/2020):  This is the current hospital active medication list Current Facility-Administered Medications  Medication Dose Route Frequency Provider Last Rate Last Admin  . 0.9 %  sodium chloride infusion   Intravenous PRN Maryjane Hurter, MD   Stopped at 01/29/20 1214  . acetaminophen (TYLENOL) tablet 650 mg  650 mg Oral Q6H PRN Gleason, Otilio Carpen, PA-C      . albuterol (PROVENTIL) (2.5 MG/3ML) 0.083% nebulizer solution 2.5 mg  2.5 mg Nebulization Q6H PRN Gleason, Otilio Carpen, PA-C      . Chlorhexidine Gluconate Cloth 2 % PADS 6 each  6 each Topical Q0600 Frederik Pear, MD   6 each at 02/01/20 1117  . docusate sodium (COLACE) capsule 100 mg  100 mg Oral BID PRN Gleason, Otilio Carpen, PA-C      . insulin aspart (novoLOG) injection 0-15 Units  0-15 Units Subcutaneous TID WC Icard, Bradley L, DO   2 Units at 02/02/20 0756  . insulin aspart (novoLOG) injection 0-5 Units  0-5 Units Subcutaneous QHS Icard, Bradley L, DO   2 Units at 01/30/20 2245  . polyethylene glycol (MIRALAX / GLYCOLAX) packet 17 g  17 g Oral Daily PRN Gleason, Otilio Carpen, PA-C      .  revefenacin (YUPELRI) nebulizer solution 175 mcg  175 mcg Nebulization Daily Icard, Bradley L, DO   175 mcg at 02/01/20 [id number]     Discharge Medications: Please see discharge summary for a list of discharge medications.  Relevant Imaging Results:  Relevant Lab Results:   Additional Information SS# [id number]  Sharin Mons, RN

## 2020-02-02 NOTE — Progress Notes (Signed)
Pt report given to [facility] receiving RN.

## 2020-02-02 NOTE — Evaluation (Signed)
Occupational Therapy Evaluation Patient Details Name: Eric [name] MRN: [id number] DOB: 01/07/1927 Today's Date: 02/02/2020    History of Present Illness Pt is a 86 y/o male admitted secondary to sepsis from unclear source. Pt also with choledocholithiasis. PMH includes dementia, DM, COPD, dCHF, and a fib.    Clinical Impression   Pt admitted with the above diagnosis and has the deficits listed below. Pt would benefit from a trial of OT to attempt to increase independence with basic adls and adl transfers so he can reach the level of functioning pt was at in ALF.  Pt confused and lethargic this am, possibly due to meds, therefore participation was limited.  Feel pt may be able to return to ALF with HHOT if they offer significant assist with adls. If they do not, recommend SNF.     Follow Up Recommendations  SNF;Supervision/Assistance - 24 hour    Equipment Recommendations  Other (comment)(unsure what pt has at assisted living)    Recommendations for Other Services       Precautions / Restrictions Precautions Precautions: Fall Precaution Comments: Had fall at [facility] prior to admission.  Restrictions Weight Bearing Restrictions: No      Mobility Bed Mobility Overal bed mobility: Needs Assistance Bed Mobility: Supine to Sit;Sit to Supine     Supine to sit: Max assist Sit to supine: Mod assist   General bed mobility comments: Pt required assist to get legs off bed and back onto bed.  Pt required assist to get truck to full sitting position.  Transfers Overall transfer level: Needs assistance               General transfer comment: Pt not agreeable to standing.    Balance Overall balance assessment: Needs assistance Sitting-balance support: Bilateral upper extremity supported;Feet supported Sitting balance-Leahy Scale: Poor Sitting balance - Comments: Reliant on UE and min to min guard A to maintain sitting balance.                                     ADL either performed or assessed with clinical judgement   ADL Overall ADL's : Needs assistance/impaired Eating/Feeding: Maximal assistance;Sitting   Grooming: Maximal assistance;Bed level   Upper Body Bathing: Total assistance   Lower Body Bathing: Total assistance;Sit to/from stand   Upper Body Dressing : Maximal assistance;Bed level   Lower Body Dressing: Maximal assistance;Bed level     Toilet Transfer Details (indicate cue type and reason): Pt declined standing by pushing self back down into supine.  Would not attempt standing. Toileting- Clothing Manipulation and Hygiene: Total assistance;Bed level       Functional mobility during ADLs: Maximal assistance General ADL Comments: Pt max assist for most adls at this time. Nursing stated that pt had a very long night and very restless. Pt given ativan at midnight and now is very difficult to keep awake but when pt is awake he is restless and not cooperative.     Vision Patient Visual Report: Other (comment)(unable to assess.) Vision Assessment?: Vision impaired- to be further tested in functional context     Perception Perception Perception Tested?: No   Praxis Praxis Praxis tested?: Not tested    Pertinent Vitals/Pain Pain Assessment: Faces Faces Pain Scale: Hurts little more Pain Location: back Pain Descriptors / Indicators: Grimacing;Moaning Pain Intervention(s): Limited activity within patient's tolerance;Monitored during session;Repositioned     Hand Dominance     Extremity/Trunk  Assessment Upper Extremity Assessment Upper Extremity Assessment: Overall WFL for tasks assessed   Lower Extremity Assessment Lower Extremity Assessment: Defer to PT evaluation   Cervical / Trunk Assessment Cervical / Trunk Assessment: Kyphotic   Communication Communication Communication: Other (comment)(difficult to understand this am but had a long night)   Cognition Arousal/Alertness: Lethargic Behavior During  Therapy: Restless Overall Cognitive Status: No family/caregiver present to determine baseline cognitive functioning                                 General Comments: Dementia at baseline. Pt very tangential and required constant redirection to task.    General Comments  Pt with skin tears on B arms.  Pt currently max to total assist with most adls but this could be medication related. Unsure how much assist pt gets at assisted living at this time.     Exercises     Shoulder Instructions      Home Living Family/patient expects to be discharged to:: Assisted living                                 Additional Comments: Pt from [facility] per chart.  Pt confused and lethartic this am.      Prior Functioning/Environment Level of Independence: Needs assistance  Gait / Transfers Assistance Needed: unsure ADL's / Homemaking Assistance Needed: unsure   Comments: Unsure of PLOF as no family present and pt unable to report.         OT Problem List: Decreased activity tolerance;Impaired balance (sitting and/or standing);Decreased cognition;Decreased safety awareness;Decreased knowledge of use of DME or AE;Decreased knowledge of precautions;Pain      OT Treatment/Interventions: Self-care/ADL training;Therapeutic activities    OT Goals(Current goals can be found in the care plan section) Acute Rehab OT Goals Patient Stated Goal: none states OT Goal Formulation: Patient unable to participate in goal setting Time For Goal Achievement: 02/16/20 Potential to Achieve Goals: Fair ADL Goals Pt Will Perform Eating: with set-up;sitting Pt Will Perform Grooming: with set-up;sitting Pt Will Transfer to Toilet: with min assist;stand pivot transfer;grab bars Additional ADL Goal #1: Pt will sit at EOB with supervision in prep for adls on EOB.  OT Frequency: Min 1X/week   Barriers to D/C:    Pt in assisted living. Unsure level of support they offer.        Co-evaluation              AM-PAC OT "6 Clicks" Daily Activity     Outcome Measure Help from another person eating meals?: A Lot Help from another person taking care of personal grooming?: A Lot Help from another person toileting, which includes using toliet, bedpan, or urinal?: Total Help from another person bathing (including washing, rinsing, drying)?: A Lot Help from another person to put on and taking off regular upper body clothing?: A Lot Help from another person to put on and taking off regular lower body clothing?: A Lot 6 Click Score: 11   End of Session Nurse Communication: Mobility status  Activity Tolerance: Patient limited by lethargy Patient left: in bed;with bed alarm set;with call bell/phone within reach  OT Visit Diagnosis: Unsteadiness on feet (R26.81)                Time: [id number] OT Time Calculation (min): 23 min Charges:  OT Evaluation $OT Eval Moderate Complexity: 1  Mod  Jinger Neighbors, OTR/L  Glenford Peers 02/02/2020, 9:22 AM

## 2020-02-02 NOTE — Progress Notes (Signed)
[facility] here to transport pt to [facility]; unable to change sacral foam dressing due to pt refusal (guarding)

## 2020-02-02 NOTE — Discharge Summary (Signed)
Physician Discharge Summary  Konnor Bolitho [id number] DOB: 10-29-1926 DOA: 01/28/2020  PCP: Gelene Mink, FNP  Admit date: 01/28/2020 Discharge date: 02/02/2020  Admitted From: [facility]F Disposition:  [facility]  Recommendations for Outpatient Follow-up:  1. Follow up with PCP in 1-2 weeks 2. Follow-up with gastroenterology, Dr. Rush Landmark as needed 3. Please obtain CMP in two weeks 4. Discontinue lisinopril secondary to borderline hypotension 5. Decreased Lantus dose to 8 units of tensely daily, recommend continue to monitor blood sugars closely and adjust accordingly 6. Discontinued Eliquis secondary to recurrent falls and high bleeding risk  Home Health: No Equipment/Devices: None  Discharge Condition: Stable CODE STATUS: DNR Diet recommendation: Consistent carbohydrate diet  History of present illness:  Eric Brannockis an 86 y.o.malewith past medical history of atrial fibrillation on Eliquis, chronic diastolic congestive heart failure, COPD, dementia, diabetes mellitus type 2 who resides at [facility] assisted living facility was found on the ground after an unwitnessed fall by staff.  In the emergency room he was found to be hypertensive treated with IV fluids started empirically on IV vancomycin, Flagyl and cefepime. With a mild elevation in lactic acid, white blood cell count of 12 and UA rare bacteria. CT of the C-spine showed no acute findings chest x-ray shows no infiltrates. PCCM was consulted for admission as he was started on peripherally on Levophed.  Hospital course:  Severe Sepsis of unclear source; present on admission Patient presenting from assisted living after being found down with underlying hypertension.  No acute cardiopulmonary disease process.  Urinalysis unremarkable.  CT abdomen/pelvis notable for 2 discrete filling defects within CBD worrisome for choledocholithiasis, large colonic stool burden, unchanged infrarenal  abdominal aortic aneurysm, otherwise unremarkable.  Initially started on broad-spectrum antibiotics with vancomycin, Flagyl, cefepime; which was deescalated to cefepime alone.  Initially requiring ICU level of care with initiation of vasopressors with Levophed, which was slowly weaned off.  Blood culture showed no growth x4 days.  Completed 6-day course of cefepime.  Choledocholithiasis CT abdomen/pelvis notable for status post cholecystectomy but 2 discrete filling defects within the common bile duct worrisome for choledocholithiasis.  Right upper quadrant notable for intra and extrahepatic biliary duct dilation.  MRCP with 2 filling defects within the common bile duct consistent with choledocholithiasis, distal stone large and expands the duct, also mild dilation of the distal pancreatic duct, no evidence of pancreatitis. [facility], Dr. Rush Landmark followed during hospital course.  Plan was for possible ERCP, daughter was hesitant and declined procedure given his advanced age and concern for anesthesia.  GI with no further recommendations at this time, recommends repeat CMP in 2 weeks and can follow-up with Dr. Rush Landmark outpatient if desired.  Acute encephalopathy/unwitnessed fall: Did improve with correction of hypoglycemia, CT scan showed no acute findings. Now resolved likely due to hypoglycemia.  Fall dementia precautions on discharge.  Acute kidney injury: Creatinine 1.74 on admission, likely prerenal azotemia from dehydration.  Improved to 1.44 with IV fluid hydration.  Lisinopril discontinued for borderline hypotension as above.  Repeat renal function in 2 weeks.  Type 2 diabetes mellitus: Hemoglobin A1c 6.6, well controlled. On Lantus 24 units subcutaneously daily at home.  Was hypoglycemic on arrival, likely etiology of his fall.  Reduce Lantus dose to 8 units subcutaneously daily.  Continue consistent carbohydrate diet.  COPD Not oxygen dependent.    Continue home Spiriva.  Chronic  atrial fibrillation: On apixaban 2.5 mg p.o. twice daily at home.  Daughter reports several falls recently.  Will discontinue home anticoagulation given his recurrent falls, as risk outweigh benefit at this point.  Infrarenal abdominal aortic aneurysm CT abdomen/pelvis notable for unchanged infrarenal AAA measuring 3.4 cm.  Stable since last imaging noted on 01/2016. Recommend follow-up aortic ultrasound in 3 years  Dysphagia Evaluated by speech therapy, recommended dysphagia 3 diet, mechanical soft with thin liquids.  Continue aspiration precautions  Pressure injury of skin; present on admission Pressure Injury 01/28/20 Sacrum Posterior;Mid Stage 2 -  Partial thickness loss of dermis presenting as a shallow open injury with a red, pink wound bed without slough. 6cm x 5cm red, nonblanchable area w/ 1cm x 0.1cm open slit (Active)  01/28/20 0645  Location: Sacrum  Location Orientation: Posterior;Mid  Staging: Stage 2 -  Partial thickness loss of dermis presenting as a shallow open injury with a red, pink wound bed without slough.  Wound Description (Comments): 6cm x 5cm red, nonblanchable area w/ 1cm x 0.1cm open slit  Present on Admission: Yes     Pressure Injury 01/28/20 Heel Left;Posterior Stage 1 -  Intact skin with non-blanchable redness of a localized area usually over a bony prominence. 3cm x 2cm red nonblanchable area (Active)  01/28/20 0645  Location: Heel  Location Orientation: Left;Posterior  Staging: Stage 1 -  Intact skin with non-blanchable redness of a localized area usually over a bony prominence.  Wound Description (Comments): 3cm x 2cm red nonblanchable area  Present on Admission: Yes     Discharge Diagnoses:  Active Problems:   Abnormal liver diagnostic imaging   Choledocholithiasis   Pressure injury of skin    Discharge Instructions  Discharge Instructions    Call MD for:  difficulty breathing, headache or visual disturbances   Complete by: As directed     Call MD for:  extreme fatigue   Complete by: As directed    Call MD for:  persistant dizziness or light-headedness   Complete by: As directed    Call MD for:  persistant nausea and vomiting   Complete by: As directed    Call MD for:  severe uncontrolled pain   Complete by: As directed    Call MD for:  temperature >100.4   Complete by: As directed    Diet - low sodium heart healthy   Complete by: As directed    Increase activity slowly   Complete by: As directed      Allergies as of 02/02/2020      Reactions   Vitamin B12 Swelling, Other (See Comments)   A "gummie" version caused swelling of the legs   Ampicillin Rash      Medication List    STOP taking these medications   Eliquis 2.5 MG Tabs tablet Generic drug: apixaban   lisinopril 5 MG tablet Commonly known as: ZESTRIL   potassium chloride 20 MEQ packet Commonly known as: KLOR-CON     TAKE these medications   acetaminophen 325 MG tablet Commonly known as: TYLENOL Take 650 mg by mouth 3 (three) times daily.   Ensure Take 120 mLs by mouth with breakfast, with lunch, and with evening meal.   FLUoxetine 20 MG capsule Commonly known as: PROZAC Take 20 mg by mouth daily.   Lantus SoloStar 100 UNIT/ML Solostar Pen Generic drug: insulin glargine Inject 8 Units into the skin at bedtime. What changed: how much to take   ondansetron 4 MG tablet Commonly known as: ZOFRAN Take 4 mg by mouth every 6 (six) hours as needed for nausea or vomiting.  SENIOR MULTIVITAMIN PLUS PO Take 1 tablet by mouth daily with breakfast.   sennosides-docusate sodium 8.6-50 MG tablet Commonly known as: SENOKOT-S Take 1 tablet by mouth See admin instructions. Take 1 tablet by mouth at bedtime and hold for loose or liquid stools   simethicone 80 MG chewable tablet Commonly known as: MYLICON Chew 80 mg by mouth 4 (four) times daily -  before meals and at bedtime.   tiotropium 18 MCG inhalation capsule Commonly known as:  SPIRIVA Place 18 mcg into inhaler and inhale daily.      Follow-up Information    Gelene Mink, FNP. Schedule an appointment as soon as possible for a visit in 1 week(s).   Specialty: Family Medicine Contact information: [address] [place] [address] [phone]        Mansouraty, Telford Nab., MD. Call in 2 week(s).   Specialties: Gastroenterology, Internal Medicine Contact information: [place] [address] [phone]          Allergies  Allergen Reactions  . Vitamin B12 Swelling and Other (See Comments)    A "gummie" version caused swelling of the legs  . Ampicillin Rash    Consultations:  [facility] -Dr. Rush Landmark   Procedures/Studies: CT ABDOMEN PELVIS WO CONTRAST  Result Date: 01/28/2020 CLINICAL DATA:  Non localized fall, now with abdominal pain and hypotension. Patient is currently on anticoagulation for atrial fibrillation. EXAM: CT ABDOMEN AND PELVIS WITHOUT CONTRAST TECHNIQUE: Multidetector CT imaging of the abdomen and pelvis was performed following the standard protocol without IV contrast. COMPARISON:  CT abdomen and pelvis-01/30/2016 FINDINGS: The lack of intravenous contrast limits the ability to evaluate solid abdominal organs. Lower chest: Limited visualization of the lower thorax demonstrates minimal bibasilar subsegmental atelectasis as well as an approximately 2.1 x 0.9 cm cyst within the right costophrenic angle (image 6, series 5). No discrete focal airspace opacities. Cardiomegaly. Exuberant calcifications about the mitral valve annulus. Suspected sequela of previous aortic valve repair and median sternotomy, incompletely evaluated. No pericardial effusion. Hepatobiliary: Mild atrophy of the left lobe of the liver. While the patient is post cholecystectomy, there are at least 2 discrete filling defects within the CBD worrisome for choledocholithiasis with dominant opacity measuring approximately 1.2 x 1.0 cm  (axial image 25, series 4; coronal image 55, series 8). This finding Marlou Sa is associated with suspected upstream intrahepatic bili duct dilatation this is suboptimally evaluated on this noncontrast examination. No ascites. Pancreas: Scattered dystrophic calcifications within the pancreas could represent the sequela of chronic calcific pancreatitis. Currently there is no superimposed peripancreatic stranding. Spleen: Normal noncontrast appearance of the spleen. Adrenals/Urinary Tract: Normal noncontrast appearance of the bilateral kidneys for age. No renal stones. No renal stones are seen along the expected course of either ureter or the urinary bladder. No urinary obstruction or perinephric stranding. Normal noncontrast appearance the bilateral adrenal glands. Normal appearance of the urinary bladder given degree distention. Stomach/Bowel: Large colonic stool burden without evidence of enteric obstruction. Scattered colonic diverticulosis without evidence of super imposed acute diverticulitis on this noncontrast examination. While there is a moderate amount of liquid stool within the cecum and ascending colon there are no discrete areas of bowel wall thickening on this noncontrast examination. Normal noncontrast appearance of the terminal ileum and appendix. No pneumoperitoneum, pneumatosis or portal venous gas. Vascular/Lymphatic: Mild aneurysmal dilatation of the abdominal aorta measuring 3.3 x 3.2 x 3.4 cm and maximal oblique short axis axial (image 41, series 4), coronal (image 43, series 8 and sagittal (  image 92, series 7), dimensions respectively previously, 3.3 x 3.3 x 3.4 cm when compared to the 01/2016 examination. Large amount of slightly irregular calcified atherosclerotic plaque throughout the abdominal aorta. No definitive intramural hematoma. No bulky retroperitoneal, mesenteric, pelvic or inguinal lymphadenopathy on this noncontrast examination. Reproductive: The prostate is borderline enlarged  measuring 5.3 x 4.5 x 3.8 cm (axial image 77, series 4; sagittal image 89, series 7) with minimal mass effect on the undersurface of the urinary bladder. No free fluid the pelvic cul-de-sac. Other: Subcutaneous stranding about the caudal aspect of the ventral abdominal pannus likely represents the location of subcutaneous medication administration. Minimal amount of subcutaneous edema about the midline of the low back. Musculoskeletal: No acute or aggressive osseous abnormalities. Mild-to-moderate multilevel lumbar spine DDD, worse at L4-L5 with disc space height loss, endplate irregularity and sclerosis. Post left total hip replacement, the caudal aspect of which is not imaged though without evidence of hardware failure loosening. Moderate degenerative change the right hip with joint space loss, subchondral sclerosis and osteophytosis. IMPRESSION: 1. While the patient is post cholecystectomy, there are 2 discrete filling defects within the CBD worrisome for choledocholithiasis with dominant suspected stone measuring 1.2 cm in diameter with suspected upstream intrahepatic biliary ductal dilatation, incompletely evaluated on this noncontrast examination. Further evaluation could be performed with MRCP or ERCP as indicated. 2. Otherwise, no explanation for patient's acute abdominal pain and hypotension. Specifically, no evidence of urinary / enteric obstruction or retroperitoneal hematoma. 3. Large colonic stool burden without evidence of enteric obstruction. 4. Colonic diverticulosis without evidence superimposed acute diverticulitis on this noncontrast examination. 5. Unchanged infrarenal abdominal aortic aneurysm measuring 3.4 cm in maximal diameter, stable since the 01/2016 examination. Abdominal Aortic Aneurysm (ICD10-I71.9). Recommend follow-up aortic ultrasound in 3 years. This recommendation follows ACR consensus guidelines: White Paper of the ACR Incidental Findings Committee II on Vascular Findings. J Am  Coll Radiol 2013; 10:789-794. 6. Borderline prostatomegaly. Electronically Signed   By: Sandi Mariscal M.D.   On: 01/28/2020 09:58   DG Elbow 2 Views Left  Result Date: 01/28/2020 CLINICAL DATA:  Fall EXAM: LEFT ELBOW - 2 VIEW COMPARISON:  None. FINDINGS: No fracture or malalignment.  No significant elbow effusion. IMPRESSION: No acute osseous abnormality Electronically Signed   By: Donavan Foil M.D.   On: 01/28/2020 01:45   DG Elbow 2 Views Right  Result Date: 01/28/2020 CLINICAL DATA:  Fall with skin tear EXAM: RIGHT ELBOW - 2 VIEW COMPARISON:  None. FINDINGS: No fracture or malalignment.  No definitive elbow effusion IMPRESSION: No acute osseous abnormality Electronically Signed   By: Donavan Foil M.D.   On: 01/28/2020 01:45   CT HEAD WO CONTRAST  Result Date: 02/01/2020 CLINICAL DATA:  Recent fall with head injury, initial encounter EXAM: CT HEAD WITHOUT CONTRAST TECHNIQUE: Contiguous axial images were obtained from the base of the skull through the vertex without intravenous contrast. COMPARISON:  01/28/2020 FINDINGS: Brain: Atrophic changes and chronic white matter ischemic changes are seen. Findings of prior right occipital infarct are noted. No findings to suggest acute hemorrhage, acute infarction or space-occupying mass lesion are seen. Vascular: No hyperdense vessel or unexpected calcification. Skull: Normal. Negative for fracture or focal lesion. Sinuses/Orbits: No acute finding. Other: None. IMPRESSION: Chronic atrophic and ischemic changes similar to the prior exam. No acute abnormality noted. Electronically Signed   By: Inez Catalina M.D.   On: 02/01/2020 21:51   CT Head Wo Contrast  Result Date: 01/28/2020 CLINICAL DATA:  Status post fall.  EXAM: CT HEAD WITHOUT CONTRAST TECHNIQUE: Contiguous axial images were obtained from the base of the skull through the vertex without intravenous contrast. COMPARISON:  October 25, 2017 FINDINGS: Brain: There is mild to moderate severity cerebral  atrophy with widening of the extra-axial spaces and ventricular dilatation. There are areas of decreased attenuation within the white matter tracts of the supratentorial brain, consistent with microvascular disease changes. A small area of cortical encephalomalacia, with adjacent chronic white matter low attenuation is seen within the right occipital lobe. Vascular: No hyperdense vessel or unexpected calcification. Skull: Normal. Negative for fracture or focal lesion. Sinuses/Orbits: No acute finding. Other: Mild right frontal scalp soft tissue swelling is seen. IMPRESSION: 1. Generalized cerebral atrophy. 2. Chronic right occipital lobe infarct. 3. No acute intracranial abnormality. Electronically Signed   By: Virgina Norfolk M.D.   On: 01/28/2020 01:28   CT Cervical Spine Wo Contrast  Result Date: 01/28/2020 CLINICAL DATA:  Post fall. EXAM: CT HEAD WITHOUT CONTRAST CT CERVICAL SPINE WITHOUT CONTRAST TECHNIQUE: Multidetector CT imaging of the head and cervical spine was performed following the standard protocol without intravenous contrast. Multiplanar CT image reconstructions of the cervical spine were also generated. COMPARISON:  January 02, 2018 FINDINGS: CT HEAD FINDINGS Brain: There is mild cerebral atrophy with widening of the extra-axial spaces and ventricular dilatation. There are areas of decreased attenuation within the white matter tracts of the supratentorial brain, consistent with microvascular disease changes. A small area of cortical encephalomalacia, with adjacent chronic white matter low attenuation, is seen within the right occipital lobe. Vascular: No hyperdense vessel or unexpected calcification. Skull: Normal. Negative for fracture or focal lesion. Sinuses/Orbits: No acute finding. Other: None. CT CERVICAL SPINE FINDINGS Alignment: There is reversal of the normal cervical lordosis. Approximately 2 mm anterolisthesis of the C2 vertebral body is seen on C3. Skull base and vertebrae: No acute  fracture. Marked severity degenerative changes seen along the anterior atlantoaxial joint without primary bone lesion or focal pathologic process. Soft tissues and spinal canal: No prevertebral fluid or swelling. No visible canal hematoma. Disc levels: Marked severity endplate sclerosis is seen at the levels of C4-C5 and C5-C6. Mild to moderate severity endplate sclerosis is noted at the level of C6-C7. Marked severity intervertebral disc space narrowing is seen at the levels of C4-C5 and C5-C6. Moderate severity multilevel bilateral facet joint hypertrophy is seen. Upper chest: Multiple sternal wires are present. Other: N/A IMPRESSION: 1. No acute intracranial abnormality. 2. Mild cerebral atrophy with widening of the extra-axial spaces and ventricular dilatation. 3. Small, old right occipital lobe infarct. 4. Marked severity degenerative changes of the cervical spine, most prominent at the levels of C4-C5 and C5-C6. 5. Approximately 2 mm anterolisthesis of the C2 vertebral body on C3. Electronically Signed   By: Virgina Norfolk M.D.   On: 01/28/2020 01:24   DG Pelvis Portable  Result Date: 01/28/2020 CLINICAL DATA:  Fall EXAM: PORTABLE PELVIS 1-2 VIEWS COMPARISON:  06/30/2014 FINDINGS: SI joints are non widened. Pubic symphysis and rami are intact. Status post left hip replacement with intact hardware and normal alignment. No acute displaced fracture or dislocation. Vascular calcifications IMPRESSION: Left hip replacement.  No definite acute osseous abnormality Electronically Signed   By: Donavan Foil M.D.   On: 01/28/2020 01:43   DG Hand 2 View Right  Result Date: 01/28/2020 CLINICAL DATA:  Fall skin tear EXAM: RIGHT HAND - 2 VIEW COMPARISON:  None. FINDINGS: Mild motion degradation. Bones appear osteopenic. There is no fracture or subluxation.  Intercarpal degenerative change. Joint space narrowing at the MCP and IP joints. IMPRESSION: No acute osseous abnormality Electronically Signed   By: Donavan Foil M.D.   On: 01/28/2020 01:44   DG Hand 2 View Left  Result Date: 01/28/2020 CLINICAL DATA:  Fall EXAM: LEFT HAND - 2 VIEW COMPARISON:  None. FINDINGS: No fracture or malalignment. Mild motion degradation. Degenerative changes at the IP joints. Degenerative changes at the STT interval and first [facility] joint. Cartilaginous calcifications. IMPRESSION: No acute osseous abnormality Electronically Signed   By: Donavan Foil M.D.   On: 01/28/2020 01:46   MR ABDOMEN MRCP WO CONTRAST  Result Date: 01/29/2020 CLINICAL DATA:  Choledocholithiasis suspected on CT exam. EXAM: MRI ABDOMEN WITHOUT CONTRAST  (INCLUDING MRCP) TECHNIQUE: Multiplanar multisequence MR imaging of the abdomen was performed. Heavily T2-weighted images of the biliary and pancreatic ducts were obtained, and three-dimensional MRCP images were rendered by post processing. COMPARISON:  CT 01/28/2020, ultrasound 01/28/2019 FINDINGS: Lower chest:  Lung bases are clear. Hepatobiliary: Mild intrahepatic biliary duct dilatation. Moderate extrahepatic biliary duct dilatation. Common hepatic duct measures 10 mm. The common bile duct measures 10 mm. Within the distal common bile duct there is a large filling defect measuring 7 mm in diameter (image 21/3). On coronal imaging this filling defect extends to the ampullary region and mildly expands the duct. The stone measures 14 mm in craniocaudad dimension. More proximally there is a second smaller filling defect within distal common hepatic duct (image 17/3). No focal hepatic lesion. Pancreas: The distal pancreatic duct is mildly dilated to 6 mm (image 17/3). The more proximal duct is normal caliber. The duct in the pancreatic tail is normal caliber. Tiny cystic lesion in the pancreas tail measuring 3 mm. No variant pancreatic ductal anatomy no evidence of pancreatic inflammation. Spleen: Normal spleen. Adrenals/urinary tract: Adrenal glands and kidneys are normal. Stomach/Bowel: Stomach and limited of the  small bowel is unremarkable Vascular/Lymphatic: Abdominal aortic normal caliber. No retroperitoneal periportal lymphadenopathy. Musculoskeletal: No aggressive osseous lesion IMPRESSION: 1. Two filling defects within the common bile duct consistent with choledocholithiasis. The distal stone is large and expands the duct. 2. Mild intrahepatic duct dilatation and moderate extrahepatic biliary duct dilatation. 3. Mild dilatation of the distal pancreatic duct through the pancreatic head likely relates to choledocholithiasis. No evidence of pancreatitis. 4. Postcholecystectomy. Electronically Signed   By: Suzy Bouchard M.D.   On: 01/29/2020 08:11   DG Chest Port 1 View  Result Date: 01/28/2020 CLINICAL DATA:  Level 2 trauma fall EXAM: PORTABLE CHEST 1 VIEW COMPARISON:  01/02/2018 FINDINGS: Post sternotomy changes. No focal opacity or pleural effusion. Stable cardiomediastinal silhouette with borderline cardiomegaly. Aortic atherosclerosis. No pneumothorax. IMPRESSION: Stable.  No significant interval change compared with prior. Electronically Signed   By: Donavan Foil M.D.   On: 01/28/2020 01:42   DG Shoulder Left  Result Date: 01/28/2020 CLINICAL DATA:  Fall 2 weeks ago with left shoulder pain. EXAM: LEFT SHOULDER - 2+ VIEW COMPARISON:  None. FINDINGS: Moderate degenerative changes of the [facility] joint and mild degenerate change of the glenohumeral joint. No acute fracture or dislocation. Subtle focus of increased density in the soft tissues just superior to the greater tuberosity which could represent a subtle chip fracture versus calcification over the region of the supraspinatus tendon. Degenerative changes of the spine. IMPRESSION: 1. Subtle density in the soft tissues just superior to the greater tuberosity which may represent calcification over the region of the supraspinatus tendon and less likely acute rib fracture.  2. Moderate degenerative changes of the [facility] joint mild degenerate change of the glenohumeral  joint. Electronically Signed   By: Marin Olp M.D.   On: 01/28/2020 08:31   ECHOCARDIOGRAM COMPLETE  Result Date: 01/28/2020    ECHOCARDIOGRAM REPORT   Patient Name:   Eric [name] Date of Exam: 01/28/2020 Medical Rec #:  [id number]      Height:       69.0 in Accession #:    [id number]     Weight:       151.0 lb Date of Birth:  1927/06/05       BSA:          1.833 m Patient Age:    86 years       BP:           110/77 mmHg Patient Gender: M              HR:           97 bpm. Exam Location:  Inpatient Procedure: 2D Echo Indications:    Sepsis  History:        Patient has no prior history of Echocardiogram examinations.  Sonographer:    Johny Chess Referring Phys: [id number] LAURA R GLEASON  Sonographer Comments: Image acquisition challenging due to respiratory motion. IMPRESSIONS  1. Left ventricular ejection fraction, by estimation, is 60 to 65%. The left ventricle has normal function. Left ventricular endocardial border not optimally defined to evaluate regional wall motion. There is moderate concentric left ventricular hypertrophy. Cannot assess LV diastolic filling due to underlying atrial fibrillation.  2. Right ventricular systolic function is normal. The right ventricular size is normal.  3. The mitral valve is degenerative. No evidence of mitral valve regurgitation. Mild to moderate mitral stenosis. The mean mitral valve gradient is 5.0 mmHg. Severe mitral annular calcification.  4. The aortic valve is normal in structure. Aortic valve regurgitation is not visualized. No aortic stenosis is present.  5. The inferior vena cava is normal in size with greater than 50% respiratory variability, suggesting right atrial pressure of 3 mmHg.  6. Left atrial size was severely dilated. FINDINGS  Left Ventricle: Left ventricular ejection fraction, by estimation, is 60 to 65%. The left ventricle has normal function. Left ventricular endocardial border not optimally defined to evaluate regional wall motion. Definity  contrast agent was given IV to delineate the left ventricular endocardial borders. The left ventricular internal cavity size was normal in size. There is moderate concentric left ventricular hypertrophy. Cannot assess LV diastolic filling due to underlying atrial fibrillation. Right Ventricle: The right ventricular size is normal. No increase in right ventricular wall thickness. Right ventricular systolic function is normal. Left Atrium: Left atrial size was severely dilated. Right Atrium: Right atrial size was normal in size. Pericardium: There is no evidence of pericardial effusion. Mitral Valve: The mitral valve is degenerative in appearance. Severe mitral annular calcification. No evidence of mitral valve regurgitation. Mild to moderate mitral valve stenosis. MV peak gradient, 9.6 mmHg. The mean mitral valve gradient is 5.0 mmHg. Tricuspid Valve: The tricuspid valve is normal in structure. Tricuspid valve regurgitation is not demonstrated. No evidence of tricuspid stenosis. Aortic Valve: The aortic valve is normal in structure. Aortic valve regurgitation is not visualized. No aortic stenosis is present. Pulmonic Valve: The pulmonic valve was normal in structure. Pulmonic valve regurgitation is not visualized. No evidence of pulmonic stenosis. Aorta: The aortic root is normal in size and structure. Venous: The inferior vena cava is normal  in size with greater than 50% respiratory variability, suggesting right atrial pressure of 3 mmHg. IAS/Shunts: No atrial level shunt detected by color flow Doppler.  LEFT VENTRICLE PLAX 2D LVIDd:         4.10 cm LVIDs:         2.30 cm LV PW:         1.00 cm LV IVS:        0.90 cm LVOT diam:     2.20 cm LV SV:         53 LV SV Index:   29 LVOT Area:     3.80 cm  LEFT ATRIUM           Index LA diam:      5.20 cm 2.84 cm/m LA Vol (A4C): 89.9 ml 49.03 ml/m  AORTIC VALVE LVOT Vmax:   76.25 cm/s LVOT Vmean:  48.450 cm/s LVOT VTI:    0.138 m MITRAL VALVE MV Peak grad: 9.6 mmHg   SHUNTS MV Mean grad: 5.0 mmHg  Systemic VTI:  0.14 m MV Vmax:      1.55 m/s  Systemic Diam: 2.20 cm MV Vmean:     86.6 cm/s Fransico Him MD Electronically signed by Fransico Him MD Signature Date/Time: 01/28/2020/1:22:36 PM    Final    US Abdomen Limited RUQ  Result Date: 01/28/2020 CLINICAL DATA:  Choledocholithiasis. EXAM: ULTRASOUND ABDOMEN LIMITED RIGHT UPPER QUADRANT COMPARISON:  CT scan January 28, 2020 FINDINGS: Gallbladder: Surgically absent Common bile duct: Diameter: 13.3 mm Intrahepatic ductal dilatation is noted as well as extrahepatic ductal dilatation. No discrete filling defects are seen. Liver: Increased echogenicity with no focal mass. Portal vein is patent on color Doppler imaging with normal direction of blood flow towards the liver. Other: None. IMPRESSION: 1. Intra and extrahepatic biliary duct dilatation, as seen on the CT scan from earlier today. The discrete filling defects in the common bile duct seen on CT imaging today are not visualized on ultrasound. ERCP or MRCP could better evaluate as clinically warranted. Electronically Signed   By: Dorise Bullion III M.D   On: 01/28/2020 13:01      Subjective: Patient seen and examined at bedside, sleeping.  Received Ativan overnight for confusion.  Discharging back to SNF today as family has declined ERCP for choledocholithiasis.  No other acute concerns overnight per nursing staff.  Discharge Exam: Vitals:   02/02/20 0900 02/02/20 0911  BP: 128/78 139/70  Pulse:    Resp:    Temp:    SpO2:     Vitals:   02/01/20 2041 02/02/20 0420 02/02/20 0900 02/02/20 0911  BP: 110/64 98/60 128/78 139/70  Pulse: 77 86    Resp: 15 17    Temp: 98.2 F (36.8 C) 98.4 F (36.9 C)    TempSrc:  Oral    SpO2:  97%    Weight:      Height:        General: Pt is alert, awake, not in acute distress, pleasantly confused Cardiovascular: RRR, S1/S2 +, no rubs, no gallops Respiratory: CTA bilaterally, no wheezing, no rhonchi Abdominal: Soft,  NT, ND, bowel sounds + Extremities: no edema, no cyanosis Skin: Multiple areas of ecchymosis, arms wrapped with gauze, no other concerning lesions/rashes appreciated    The results of significant diagnostics from this hospitalization (including imaging, microbiology, ancillary and laboratory) are listed below for reference.     Microbiology: Recent Results (from the past 240 hour(s))  Blood culture (routine x 2)  Status: None (Preliminary result)   Collection Time: 01/28/20 12:27 AM   Specimen: BLOOD  Result Value Ref Range Status   Specimen Description BLOOD LEFT ARM  Final   Special Requests   Final    BOTTLES DRAWN AEROBIC ONLY Blood Culture results may not be optimal due to an inadequate volume of blood received in culture bottles Performed at [facility] [address], [place], [place] [address]    Culture NO GROWTH 4 DAYS  Final   Report Status PENDING  Incomplete  Blood culture (routine x 2)     Status: None (Preliminary result)   Collection Time: 01/28/20 12:32 AM   Specimen: BLOOD  Result Value Ref Range Status   Specimen Description BLOOD RIGHT FOREARM  Final   Special Requests   Final    BOTTLES DRAWN AEROBIC AND ANAEROBIC Blood Culture results may not be optimal due to an inadequate volume of blood received in culture bottles Performed at [facility], [place] [address] [place], [place] [address]    Culture NO GROWTH 4 DAYS  Final   Report Status PENDING  Incomplete  Culture, Urine     Status: None   Collection Time: 01/28/20  1:13 AM   Specimen: Urine, Catheterized  Result Value Ref Range Status   Specimen Description URINE, CATHETERIZED  Final   Special Requests NONE  Final   Culture   Final    NO GROWTH Performed at [facility], [place] [address] [place] [address], [place], [place] [address]    Report Status 02/01/2020 FINAL  Final  SARS CORONAVIRUS 2 (TAT 6-24 HRS) Nasopharyngeal Nasopharyngeal Swab     Status: None   Collection Time: 01/28/20  2:23 AM    Specimen: Nasopharyngeal Swab  Result Value Ref Range Status   SARS Coronavirus 2 NEGATIVE NEGATIVE Final    Comment: (NOTE) SARS-CoV-2 target nucleic acids are NOT DETECTED. The SARS-CoV-2 RNA is generally detectable in upper and lower respiratory specimens during the acute phase of infection. Negative results do not preclude SARS-CoV-2 infection, do not rule out co-infections with other pathogens, and should not be used as the sole basis for treatment or other patient management decisions. Negative results must be combined with clinical observations, patient history, and epidemiological information. The expected result is Negative. Fact Sheet for Patients: SugarRoll.be Fact Sheet for Healthcare Providers: https://www.woods-mathews.com/ This test is not yet approved or cleared by the [place] FDA and  has been authorized for detection and/or diagnosis of SARS-CoV-2 by FDA under an Emergency Use Authorization (EUA). This EUA will remain  in effect (meaning this test can be used) for the duration of the COVID-19 declaration under Section 56 4(b)(1) of the Act, 21 U.S.C. section 360bbb-3(b)(1), unless the authorization is terminated or revoked sooner. Performed at [facility], [place] [address], [place], [place] [address]   MRSA PCR Screening     Status: None   Collection Time: 01/28/20  7:41 AM   Specimen: Nasal Mucosa; Nasopharyngeal  Result Value Ref Range Status   MRSA by PCR NEGATIVE NEGATIVE Final    Comment:        The GeneXpert MRSA Assay (FDA approved for NASAL specimens only), is one component of a comprehensive MRSA colonization surveillance program. It is not intended to diagnose MRSA infection nor to guide or monitor treatment for MRSA infections. Performed at [facility], [place] [address] [place], [place] [address]   SARS Coronavirus 2 by RT PCR  Status: None   Collection Time: 02/02/20  2:55 AM   Result Value Ref Range Status   SARS Coronavirus 2 NEGATIVE NEGATIVE Final    Comment: (NOTE) Result indicates the ABSENCE of SARS-CoV-2 RNA in the patient specimen.  The lowest concentration of SARS-CoV-2 viral copies this assay can detect in nasopharyngeal swab specimens is 500 copies / mL.  A negative result does not preclude SARS-CoV-2 infection and should not be used as the sole basis for patient management decisions. A negative result may occur with improper specimen collection / handling, submission of a specimen other than nasopharyngeal swab, presence of viral mutation(s) within the areas targeted by this assay, and inadequate number of viral copies (<500 copies / mL) present.  Negative results must be combined with clinical observations, patient history, and epidemiological information.  The expected result is NEGATIVE.  Patient Fact Sheet:  BlogSelections.co.uk   Provider Fact Sheet:  https://lucas.com/   This test is not yet approved or cleared by the [place] FDA and  has been authorized for  detection and/or diagnosis of SARS-CoV-2 by FDA under an Emergency Use Authorization (EUA).  This EUA will remain in effect (meaning this test can be used) for the duration of  the COVID-19 declaration under Section 564(b)(1) of the Act, 21 U.S.C. section 360bbb-3(b)(1), unless the authorization is terminated or revoked sooner Performed at [facility], [place] [address] [place] [address] [place], [place] [address]      Labs: BNP (last 3 results) Recent Labs    01/28/20 0705  BNP 0000000*   Basic Metabolic Panel: Recent Labs  Lab 01/28/20 0031 01/28/20 0037 01/28/20 0705 01/29/20 0424 01/31/20 1053  NA 141 141 137 138 140  K 4.4 4.1 4.5 4.4 4.2  CL 109 107 109 109 110  CO2 23  --  19* 22 22  GLUCOSE 105* 100* 238* 160* 107*  BUN 41* 40* 35* 28* 15  CREATININE 1.74* 1.70* 1.45* 1.43* 1.44*  CALCIUM 9.3  --  8.4* 8.2* 8.3*  MG   --   --  1.7 1.5*  --   PHOS  --   --  1.7* 2.4*  --    Liver Function Tests: Recent Labs  Lab 01/28/20 0031 01/29/20 0424 01/31/20 1053  AST 26 24 23   ALT 14 13 14   ALKPHOS 58 49 48  BILITOT 0.6 0.6 1.5*  PROT 6.6 5.5* 5.9*  ALBUMIN 3.6 2.9* 2.7*   No results for input(s): LIPASE, AMYLASE in the last 168 hours. No results for input(s): AMMONIA in the last 168 hours. CBC: Recent Labs  Lab 01/28/20 0031 01/28/20 0037 01/28/20 0705 01/31/20 1044  WBC 12.4*  --  9.2 6.3  HGB 11.9* 11.9* 11.9* 10.3*  HCT 37.7* 35.0* 37.3* 32.0*  MCV 99.2  --  97.6 96.1  PLT 155  --  159 139*   Cardiac Enzymes: No results for input(s): CKTOTAL, CKMB, CKMBINDEX, TROPONINI in the last 168 hours. BNP: Invalid input(s): POCBNP CBG: Recent Labs  Lab 02/01/20 0647 02/01/20 1146 02/01/20 1640 02/01/20 2213 02/02/20 0654  GLUCAP 130* 130* 155* 159* 132*   D-Dimer No results for input(s): DDIMER in the last 72 hours. Hgb A1c No results for input(s): HGBA1C in the last 72 hours. Lipid Profile No results for input(s): CHOL, HDL, LDLCALC, TRIG, CHOLHDL, LDLDIRECT in the last 72 hours. Thyroid function studies No results for input(s): TSH, T4TOTAL, T3FREE, THYROIDAB in the last 72 hours.  Invalid input(s): FREET3 Anemia work up No results for input(s):  VITAMINB12, FOLATE, FERRITIN, TIBC, IRON, RETICCTPCT in the last 72 hours. Urinalysis    Component Value Date/Time   COLORURINE YELLOW 01/28/2020 0113   APPEARANCEUR HAZY (A) 01/28/2020 0113   LABSPEC 1.020 01/28/2020 0113   PHURINE 5.0 01/28/2020 0113   GLUCOSEU NEGATIVE 01/28/2020 0113   HGBUR SMALL (A) 01/28/2020 0113   BILIRUBINUR NEGATIVE 01/28/2020 0113   KETONESUR NEGATIVE 01/28/2020 0113   PROTEINUR 30 (A) 01/28/2020 0113   NITRITE NEGATIVE 01/28/2020 0113   LEUKOCYTESUR NEGATIVE 01/28/2020 0113   Sepsis Labs Invalid input(s): PROCALCITONIN,  WBC,  LACTICIDVEN Microbiology Recent Results (from the past 240 hour(s))   Blood culture (routine x 2)     Status: None (Preliminary result)   Collection Time: 01/28/20 12:27 AM   Specimen: BLOOD  Result Value Ref Range Status   Specimen Description BLOOD LEFT ARM  Final   Special Requests   Final    BOTTLES DRAWN AEROBIC ONLY Blood Culture results may not be optimal due to an inadequate volume of blood received in culture bottles Performed at [facility] [address] [place], [place] [address]    Culture NO GROWTH 4 DAYS  Final   Report Status PENDING  Incomplete  Blood culture (routine x 2)     Status: None (Preliminary result)   Collection Time: 01/28/20 12:32 AM   Specimen: BLOOD  Result Value Ref Range Status   Specimen Description BLOOD RIGHT FOREARM  Final   Special Requests   Final    BOTTLES DRAWN AEROBIC AND ANAEROBIC Blood Culture results may not be optimal due to an inadequate volume of blood received in culture bottles Performed at [facility], [place] [address] [place], [place] [address]    Culture NO GROWTH 4 DAYS  Final   Report Status PENDING  Incomplete  Culture, Urine     Status: None   Collection Time: 01/28/20  1:13 AM   Specimen: Urine, Catheterized  Result Value Ref Range Status   Specimen Description URINE, CATHETERIZED  Final   Special Requests NONE  Final   Culture   Final    NO GROWTH Performed at [facility], [place] [address], [place], [place] [address]    Report Status 02/01/2020 FINAL  Final  SARS CORONAVIRUS 2 (TAT 6-24 HRS) Nasopharyngeal Nasopharyngeal Swab     Status: None   Collection Time: 01/28/20  2:23 AM   Specimen: Nasopharyngeal Swab  Result Value Ref Range Status   SARS Coronavirus 2 NEGATIVE NEGATIVE Final    Comment: (NOTE) SARS-CoV-2 target nucleic acids are NOT DETECTED. The SARS-CoV-2 RNA is generally detectable in upper and lower respiratory specimens during the acute phase of infection. Negative results do not preclude SARS-CoV-2 infection, do not rule out co-infections with  other pathogens, and should not be used as the sole basis for treatment or other patient management decisions. Negative results must be combined with clinical observations, patient history, and epidemiological information. The expected result is Negative. Fact Sheet for Patients: SugarRoll.be Fact Sheet for Healthcare Providers: https://www.woods-mathews.com/ This test is not yet approved or cleared by the [place] FDA and  has been authorized for detection and/or diagnosis of SARS-CoV-2 by FDA under an Emergency Use Authorization (EUA). This EUA will remain  in effect (meaning this test can be used) for the duration of the COVID-19 declaration under Section 56 4(b)(1) of the Act, 21 U.S.C. section 360bbb-3(b)(1), unless the authorization is terminated or revoked sooner. Performed at [facility], [address] [place], [place] [address]   MRSA PCR Screening     Status: None   Collection Time: 01/28/20  7:41 AM   Specimen: Nasal Mucosa; Nasopharyngeal  Result Value Ref Range Status   MRSA by PCR NEGATIVE NEGATIVE Final    Comment:        The GeneXpert MRSA Assay (FDA approved for NASAL specimens only), is one component of a comprehensive MRSA colonization surveillance program. It is not intended to diagnose MRSA infection nor to guide or monitor treatment for MRSA infections. Performed at [facility], [place] [address], [place], [place] [address]   SARS Coronavirus 2 by RT PCR     Status: None   Collection Time: 02/02/20  2:55 AM  Result Value Ref Range Status   SARS Coronavirus 2 NEGATIVE NEGATIVE Final    Comment: (NOTE) Result indicates the ABSENCE of SARS-CoV-2 RNA in the patient specimen.  The lowest concentration of SARS-CoV-2 viral copies this assay can detect in nasopharyngeal swab specimens is 500 copies / mL.  A negative result does not preclude SARS-CoV-2 infection and should not be used as the sole  basis for patient management decisions. A negative result may occur with improper specimen collection / handling, submission of a specimen other than nasopharyngeal swab, presence of viral mutation(s) within the areas targeted by this assay, and inadequate number of viral copies (<500 copies / mL) present.  Negative results must be combined with clinical observations, patient history, and epidemiological information.  The expected result is NEGATIVE.  Patient Fact Sheet:  BlogSelections.co.uk   Provider Fact Sheet:  https://lucas.com/   This test is not yet approved or cleared by the [place] FDA and  has been authorized for  detection and/or diagnosis of SARS-CoV-2 by FDA under an Emergency Use Authorization (EUA).  This EUA will remain in effect (meaning this test can be used) for the duration of  the COVID-19 declaration under Section 564(b)(1) of the Act, 21 U.S.C. section 360bbb-3(b)(1), unless the authorization is terminated or revoked sooner Performed at [facility], [place] [address], [place], [place] [address]      Time coordinating discharge: Over 30 minutes  SIGNED:   Taeler Winning J [place] DO  [facility] 02/02/2020, 10:16 AM

## 2020-02-02 NOTE — Progress Notes (Addendum)
SLP Cancellation Note  Patient Details Name: Eric [name] MRN: [id number] DOB: December 03, 1926   Cancelled treatment:       Reason Eval/Treat Not Completed: Fatigue/lethargy limiting ability to participate. Pt given ativan late last night, and is currently insufficiently arousable for safe po intake. RN reports pt has been tolerating current (soft) diet without difficulty, but requires assist with feeding. DC to [place] planned for today.    Ettore Trebilcock B. Quentin Ore, [facility], [place] Speech Language Pathologist Office: [phone]   Shonna Chock 02/02/2020, 10:00 AM

## 2020-02-02 NOTE — TOC Transition Note (Signed)
Transition of Care [facility]) - CM/SW Discharge Note   Patient Details  Name: Eric [name] MRN: [id number] Date of Birth: 1927/08/06  [facility] of [facility] CM/SW Contact:  Sharin Mons, RN Phone Number: [phone] 02/02/2020, 9:10 AM   Clinical Narrative:     Patient will DC to: [place] Anticipated DC date: 02/02/2020 Family notified: [name] ( daughter) Transport by: Corey Harold   Per MD patient ready for DC to . RN, patient, patient's family, and facility notified of DC. Discharge Summary and FL2 sent to facility. RN to call report prior to discharge ([phone]). Rm# [place] packet on chart. Ambulance transport requested for patient.   [name] (Daughter)     [phone]       RNCM will sign off for now as intervention is no longer needed. Please consult [place] again if new needs arise.   Final next level of care: Skilled Nursing Facility Barriers to Discharge: No Barriers Identified   Patient Goals and CMS Choice        Discharge Placement   Discharge Plan and Services     Social Determinants of Health (SDOH) Interventions     Readmission Risk Interventions No flowsheet data found.

## 2020-02-03 ENCOUNTER — Encounter: Payer: Self-pay | Admitting: Adult Health

## 2020-02-03 ENCOUNTER — Encounter: Payer: Self-pay | Admitting: Gastroenterology

## 2020-03-08 ENCOUNTER — Ambulatory Visit: Payer: 59 | Admitting: Gastroenterology

## 2020-05-17 ENCOUNTER — Ambulatory Visit (INDEPENDENT_AMBULATORY_CARE_PROVIDER_SITE_OTHER): Payer: Medicare Other | Admitting: Gastroenterology

## 2020-05-17 ENCOUNTER — Encounter: Payer: Self-pay | Admitting: Gastroenterology

## 2020-05-17 ENCOUNTER — Other Ambulatory Visit (INDEPENDENT_AMBULATORY_CARE_PROVIDER_SITE_OTHER): Payer: Medicare Other

## 2020-05-17 VITALS — BP 102/78 | HR 71 | Ht 69.0 in | Wt 139.0 lb

## 2020-05-17 DIAGNOSIS — R932 Abnormal findings on diagnostic imaging of liver and biliary tract: Secondary | ICD-10-CM | POA: Diagnosis not present

## 2020-05-17 DIAGNOSIS — R935 Abnormal findings on diagnostic imaging of other abdominal regions, including retroperitoneum: Secondary | ICD-10-CM | POA: Diagnosis not present

## 2020-05-17 DIAGNOSIS — K8050 Calculus of bile duct without cholangitis or cholecystitis without obstruction: Secondary | ICD-10-CM

## 2020-05-17 DIAGNOSIS — K838 Other specified diseases of biliary tract: Secondary | ICD-10-CM

## 2020-05-17 LAB — HEPATIC FUNCTION PANEL
ALT: 9 U/L (ref 0–53)
AST: 15 U/L (ref 0–37)
Albumin: 3.7 g/dL (ref 3.5–5.2)
Alkaline Phosphatase: 68 U/L (ref 39–117)
Bilirubin, Direct: 0.2 mg/dL (ref 0.0–0.3)
Total Bilirubin: 0.7 mg/dL (ref 0.2–1.2)
Total Protein: 6.6 g/dL (ref 6.0–8.3)

## 2020-05-17 LAB — LIPASE: Lipase: 31 U/L (ref 11.0–59.0)

## 2020-05-17 LAB — AMYLASE: Amylase: 50 U/L (ref 27–131)

## 2020-05-17 NOTE — Patient Instructions (Signed)
Your provider has requested that you go to the basement level for lab work before leaving today. Press "B" on the elevator. The lab is located at the first door on the left as you exit the elevator.  If you are age 84 or older, your body mass index should be between 23-30. Your Body mass index is 20.53 kg/m. If this is out of the aforementioned range listed, please consider follow up with your Primary Care Provider.  If you are age 80 or younger, your body mass index should be between 19-25. Your Body mass index is 20.53 kg/m. If this is out of the aformentioned range listed, please consider follow up with your Primary Care Provider.    Thank you for choosing me and [facility].  Dr. Rush Landmark

## 2020-05-19 ENCOUNTER — Encounter: Payer: Self-pay | Admitting: Gastroenterology

## 2020-05-19 DIAGNOSIS — R935 Abnormal findings on diagnostic imaging of other abdominal regions, including retroperitoneum: Secondary | ICD-10-CM | POA: Insufficient documentation

## 2020-05-19 DIAGNOSIS — K838 Other specified diseases of biliary tract: Secondary | ICD-10-CM | POA: Insufficient documentation

## 2020-05-19 DIAGNOSIS — R932 Abnormal findings on diagnostic imaging of liver and biliary tract: Secondary | ICD-10-CM | POA: Insufficient documentation

## 2020-05-19 NOTE — Progress Notes (Signed)
[place] VISIT   Primary Care Provider Gelene Mink, [place] [address] [phone]  Patient Profile: Eric [name] is a 86 y.o. male with a pmh significant for atrial fibrillation (on Eliquis), CHF, COPD, diabetes, dementia, choledocholithiasis (family preference to not have removed).  The patient presents to the [facility] for an evaluation and management of problem(s) noted below:  Problem List 1. Choledocholithiasis   2. Dilated bile duct   3. Abnormal CT of the abdomen   4. Abnormal MRI, liver     History of Present Illness This is a patient who was evaluated by my partner Dr. Havery Moros initially in the hospital after being admitted with question sepsis of unclear etiology.  Imaging findings were concerning for potential choledocholithiasis.  An MRI/MRCP was performed showing definitive choledocholithiasis.  Patient did not have any LFT abnormalities at any time point during that hospitalization.  After multiple attempts at touching base with family I was able to finally talk with the patient's daughter.  We had an extensive discussion and the patient's daughter wanted to minimize risks of procedures for her father.  Thus there was no plan for an ERCP to be performed during his hospitalization and no further GI follow-up unless issues were to arise per her desires.  The patient comes in today for follow-up from his facility.  He is unable to give an extensive history due to his medical comorbidities.  He states he is doing well.  He is not having any abdominal pain.  He denies any jaundice or dark urine.  GI Review of Systems Positive as above Negative for pyrosis, abdominal pain, nausea, vomiting, blood in stools  Review of Systems General: Denies fevers/chills/weight loss HEENT: Denies oral lesions Cardiovascular: Denies chest pain Pulmonary: Denies shortness of breath Gastroenterological: See  HPI Genitourinary: Denies darkened urine Hematological: Denies easy bruising/bleeding Dermatological: Dealing with wounds on both of his forearms that are covered currently; denies jaundice Psychological: Mood is stable per his report   Medications Current Outpatient Medications  Medication Sig Dispense Refill  . acetaminophen (TYLENOL) 325 MG tablet Take 650 mg by mouth 3 (three) times daily.    Marland Kitchen amLODipine (NORVASC) 5 MG tablet Take 5 mg by mouth daily.     . diclofenac sodium (VOLTAREN) 1 % GEL Apply 2 g topically every 6 (six) hours as needed. Apply to left wrist     . diltiazem (CARDIZEM) 30 MG tablet Take 1 tablet (30 mg total) by mouth 2 (two) times daily. 180 tablet 3  . ELIQUIS 2.5 MG TABS tablet TAKE 1 TABLET TWICE A DAY 180 tablet 1  . Ensure (ENSURE) Take 120 mLs by mouth with breakfast, with lunch, and with evening meal.    . famotidine (PEPCID) 40 MG tablet Take 40 mg by mouth daily.    Marland Kitchen FLUoxetine (PROZAC) 20 MG tablet Take 20 mg by mouth daily.     . fluticasone (FLONASE) 50 MCG/ACT nasal spray Place 1 spray into both nostrils daily as needed.     Marland Kitchen GLUCERNA (GLUCERNA) LIQD Give 120 ml by mouth 4 times daily    . insulin glargine (LANTUS SOLOSTAR) 100 UNIT/ML Solostar Pen Inject 8 Units into the skin at bedtime. 15 mL 11  . ipratropium (ATROVENT) 0.02 % nebulizer solution Take 2.5 mLs (0.5 mg total) by nebulization every 2 (two) hours as needed for wheezing or shortness of breath (give with Xopenex). 75 mL 0  . levalbuterol (XOPENEX) 0.63 MG/3ML  nebulizer solution Take 3 mLs (0.63 mg total) by nebulization every 2 (two) hours as needed for wheezing or shortness of breath (give with atrovent). 3 mL 0  . Multiple Vitamin (MULTIVITAMIN) tablet Take 1 tablet by mouth daily.    . nitroGLYCERIN (NITROSTAT) 0.4 MG SL tablet Place 1 tablet (0.4 mg total) under the tongue every 5 (five) minutes as needed. Call MD/NP if pain unrelieved. 25 tablet 3  . Nutritional Supplements  (NUTRITIONAL SUPPLEMENT PO) Regular Diet - mech soft texture    . Nutritional Supplements (PROMOD) LIQD Take 30 mLs by mouth 2 (two) times daily.    . ondansetron (ZOFRAN) 4 MG tablet Take 4 mg by mouth every 6 (six) hours as needed for nausea or vomiting.    . OXYGEN Give 2 liters via nasal cannula as needed for sats below 90%    . potassium chloride SA (K-DUR,KLOR-CON) 20 MEQ tablet Take 1 tablet (20 mEq total) by mouth daily. 3 tablet 0  . sennosides-docusate sodium (SENOKOT-S) 8.6-50 MG tablet Take 1 tablet by mouth at bedtime. Hold for loose or liquid stools    . simethicone (MYLICON) 80 MG chewable tablet Chew 80 mg by mouth 4 (four) times daily -  before meals and at bedtime.    Marland Kitchen tetrahydrozoline-zinc (VISINE-AC) 0.05-0.25 % ophthalmic solution Place 2 drops into both eyes as needed (dry eyes).     Marland Kitchen tiotropium (SPIRIVA) 18 MCG inhalation capsule Place 18 mcg into inhaler and inhale daily.    . traZODone (DESYREL) 50 MG tablet Take 50 mg by mouth at bedtime.    . mirtazapine (REMERON) 7.5 MG tablet Take one tablet by mouth every other night for 2 weeks then D/C     No current facility-administered medications for this visit.    Allergies Allergies  Allergen Reactions  . Cyanocobalamin [Vitamin B12] Other (See Comments)    Leg swelling to gummy B12 vitamin  . Vitamin B12 Swelling and Other (See Comments)    A "gummie" version caused swelling of the legs  . Ampicillin Rash    Skin rash  . Ampicillin Rash    Histories Past Medical History:  Diagnosis Date  . Acute endocarditis   . Atrial fibrillation ([place])   . Bilateral hydrocele 3/11   [facility]  . Cerebrovascular disease, unspecified   . Cervicalgia   . CHF (congestive heart failure) ([place])   . CKD (chronic kidney disease) stage 3, GFR 30-59 ml/min   . Colon polyp   . COPD (chronic obstructive pulmonary disease) ([place]) 2017   dx during hospitalization  . COPD (chronic obstructive pulmonary disease) ([place])   . Coronary  atherosclerosis of unspecified type of vessel, native or graft    s/p CABG 2010  . Dementia ([place])   . Diabetes mellitus without complication ([place])   . Esophageal reflux   . Essential hypertension, benign   . Foraminal stenosis of cervical region    bilateral-C4-C5, C5-C6  . GERD (gastroesophageal reflux disease)   . History of aortic stenosis    s/p valve replacment 2010  . Hyperlipemia   . Hypertension   . Iron deficiency   . Major depressive disorder   . Pure hypercholesterolemia   . Shingles 07/27/2012  . Stroke ([place])   . Type II or unspecified type diabetes mellitus with neurological manifestations, not stated as uncontrolled(250.60)   . 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  AORTIC VALVE REPLACEMENT  12/2008   with pericardial tissue valve  . CARDIAC SURGERY    . Carotid US  10/2009   B ICA stenosis, stable disease, rec rpt 2 years  . CATARACT EXTRACTION  1990's   bilateral  . CHOLECYSTECTOMY  1995  . COLONOSCOPY  [facility]  . CORONARY ARTERY BYPASS GRAFT  3/10   x3-using a left internal mammary artery to the left anterior descending coronary artery, saphenous vein graft to circumflex marginal branch, spahenous vein graft  to posterior descendingcoronary artery. Endoscopic saphenous vein harvest from bilateral thighs was done.   Marland Kitchen HYDROCELE EXCISION     bilateral ([facility])  . L leg trauma  1957   truck over left leg  . PTCA  12/99   with stent  . sphincterectomy  09/20/03   for jaundice  . TEE WITHOUT CARDIOVERSION N/A 01/09/2016   Procedure: TRANSESOPHAGEAL ECHOCARDIOGRAM (TEE);  Surgeon: Dorothy Spark, MD;  Location: [facility] [facility];  Service: Cardiovascular;  Laterality: N/A;  . TEE WITHOUT CARDIOVERSION N/A 01/15/2016   Procedure: TRANSESOPHAGEAL ECHOCARDIOGRAM (TEE);  Surgeon: Larey Dresser, MD;  Location: [facility];   Service: Cardiovascular;  Laterality: N/A;  . TOTAL HIP ARTHROPLASTY Left 06/30/2014   Procedure: LEFT TOTAL HIP ARTHROPLASTY ANTERIOR APPROACH;  Surgeon: Mcarthur Rossetti, MD;  Location: [facility];  Service: Orthopedics;  Laterality: Left;   Social History   Socioeconomic History  . Marital status: Widowed    Spouse name: Not on file  . Number of children: 3  . Years of education: Not on file  . Highest education level: Not on file  Occupational History  . Occupation: retired-truck Geophysicist/field seismologist  Tobacco Use  . Smoking status: Never Smoker  . Smokeless tobacco: Never Used  Vaping Use  . Vaping Use: Never used  Substance and Sexual Activity  . Alcohol use: Not Currently    Comment: occasional  . Drug use: Not Currently  . Sexual activity: Never  Other Topics Concern  . Not on file  Social History Narrative   ** Merged History Encounter **       Social Determinants of Health   Financial Resource Strain:   . Difficulty of Paying Living Expenses:   Food Insecurity:   . Worried About Charity fundraiser in the Last Year:   . Arboriculturist in the Last Year:   Transportation Needs:   . Film/video editor (Medical):   Marland Kitchen Lack of Transportation (Non-Medical):   Physical Activity:   . Days of Exercise per Week:   . Minutes of Exercise per Session:   Stress:   . Feeling of Stress :   Social Connections:   . Frequency of Communication with Friends and Family:   . Frequency of Social Gatherings with Friends and Family:   . Attends Religious Services:   . Active Member of Clubs or Organizations:   . Attends Archivist Meetings:   Marland Kitchen Marital Status:   Intimate Partner Violence:   . Fear of Current or Ex-Partner:   . Emotionally Abused:   Marland Kitchen Physically Abused:   . Sexually Abused:    Family History  Problem Relation Age of Onset  . Heart attack Father 17  . Breast cancer Mother 56  . Brain cancer Sister   . Prostate cancer Brother   . Diabetes Neg Hx   .  Stroke Neg Hx   . Colon cancer Neg Hx   . Liver disease  Neg Hx   . Pancreatic cancer Neg Hx   . Rectal cancer Neg Hx   . Stomach cancer Neg Hx    I have reviewed his medical, social, and family history in detail and updated the electronic medical record as necessary.    PHYSICAL EXAMINATION  BP 102/78   Pulse 71   Ht 5\' 9"  (1.753 m)   Wt 139 lb (63 kg)   BMI 20.53 kg/m  Wt Readings from Last 3 Encounters:  05/17/20 139 lb (63 kg)  01/29/20 153 lb (69.4 kg)  08/18/18 147 lb 6.4 oz (66.9 kg)  GEN: NAD, appears older than stated age, appears chronically ill but nontoxic PSYCH: Cooperative, without pressured speech EYE: Conjunctivae pink, sclerae anicteric ENT: MMM CV: Nontachycardic RESP: No audible wheezing today GI: NABS, soft, NT/ND, without rebound or guarding MSK/EXT: Upper extremities covered due to forearm abrasions per his report, lower extremity edema present bilaterally SKIN: No jaundice NEURO:  Alert & Oriented x 2 (person/place), no focal deficits   REVIEW OF DATA  I reviewed the following data at the time of this encounter:  GI Procedures and Studies  No relevant studies to review  Laboratory Studies  Reviewed those in epic  Imaging Studies  April 2021 [facility]P IMPRESSION: 1. Two filling defects within the common bile duct consistent with choledocholithiasis. The distal stone is large and expands the duct. 2. Mild intrahepatic duct dilatation and moderate extrahepatic biliary duct dilatation. 3. Mild dilatation of the distal pancreatic duct through the pancreatic head likely relates to choledocholithiasis. No evidence of pancreatitis. 4. Postcholecystectomy.   ASSESSMENT  Eric [name] is a 86 y.o. male with a pmh significant for atrial fibrillation (on Eliquis), CHF, COPD, diabetes, dementia, choledocholithiasis (family preference to not have removed).  The patient is seen today for evaluation and management of:  1. Choledocholithiasis   2. Dilated  bile duct   3. Abnormal CT of the abdomen   4. Abnormal MRI, liver    The patient seems to be hemodynamically and clinically stable.  It is not clear that he has any evidence of symptomatic choledocholithiasis.  I had significant concerns about anyone who does have choledocholithiasis and the risk of developing symptoms.  His stones are relatively large.  Certainly if he becomes obstructed and becomes cholangitic the patient's underlying medical comorbidities may suggest a worsening outcome.  After previous extensive discussion with the patient's daughter during the patient's hospitalization in April she had deferred any further invasive procedures.  I have tried to reach her today but been unsuccessful.  We will have my staff try to reach out to her to ensure that there is nothing else that has changed such that they would now consider the role of ERCP.  The patient was not clear why he was here for today's visit so is not a great resource unfortunately.  We will check pancreatic enzymes and a hepatic function panel today to see if there are any abnormalities but his exam does not suggest symptomatic choledocholithiasis currently.  If the patient's daughter or family is agreeable to proceeding with an ERCP we certainly will need to get permission for patient to come off any blood thinner but at this time we will hold on any endoscopic evaluation as had been the previous discussion with the patient's family few months ago.  Happy to have further discussions.  No significant changes to patient's medical regimen at this time.   PLAN  Obtain hepatic function panel and pancreatic enzyme evaluation Attempted  call to discuss with patient's daughter patient's recent history but unsuccessful We will have my staff reach out to patient's family next week to see if they have other concerns as to the reasoning for follow-up since they had previously not wanted a follow-up Obviously, if patient's liver tests are  concerning for symptomatic choledocholithiasis will need to be more aggressive with potential need for ERCP   Orders Placed This Encounter  Procedures  . Amylase  . Lipase  . Hepatic function panel    New Prescriptions   No medications on file   Modified Medications   No medications on file    Planned Follow Up No follow-ups on file.   Total Time in Face-to-Face and in Coordination of Care for patient including independent/personal interpretation/review of prior testing, medical history, examination, medication adjustment, communicating results with the patient directly, and documentation with the EHR is 25 minutes.   Justice Britain, MD [facility] Office # [phone]

## 2020-05-21 ENCOUNTER — Telehealth: Payer: Self-pay

## 2020-05-21 NOTE — Telephone Encounter (Signed)
See results note 8/2

## 2020-05-21 NOTE — Telephone Encounter (Signed)
-----   Message from Irving Copas., MD sent at 05/19/2020  7:09 AM EDT ----- Regarding: Follow-up with family Eric [name],I tried to reach the patient's daughter whom I had spoken with during his hospitalization in April but was not successful.The patient still has choledocholithiasis and I normally recommend that we remove that.She and family had not wanted to have invasive procedures done.You can let the family know that the liver tests are normal so I am not pressed immediately to do any procedure certainly we will understand and accept what they feel is necessary for their father.If they want to consider further discussions about an ERCP I would be happy to be available, please let me know.Thanks.GM

## 2020-08-27 ENCOUNTER — Emergency Department (HOSPITAL_COMMUNITY)
Admission: EM | Admit: 2020-08-27 | Discharge: 2020-08-27 | Disposition: A | Discharge status: Home / Self Care | Payer: Medicare Other | Attending: Emergency Medicine | Admitting: Emergency Medicine

## 2020-08-27 ENCOUNTER — Other Ambulatory Visit: Payer: Self-pay

## 2020-08-27 ENCOUNTER — Emergency Department (HOSPITAL_COMMUNITY): Payer: Medicare Other

## 2020-08-27 ENCOUNTER — Encounter (HOSPITAL_COMMUNITY): Payer: Self-pay | Admitting: Student

## 2020-08-27 DIAGNOSIS — Y92122 Bedroom in nursing home as the place of occurrence of the external cause: Secondary | ICD-10-CM | POA: Diagnosis not present

## 2020-08-27 DIAGNOSIS — S0101XA Laceration without foreign body of scalp, initial encounter: Secondary | ICD-10-CM

## 2020-08-27 DIAGNOSIS — N183 Chronic kidney disease, stage 3 unspecified: Secondary | ICD-10-CM | POA: Insufficient documentation

## 2020-08-27 DIAGNOSIS — M9702XA Periprosthetic fracture around internal prosthetic left hip joint, initial encounter: Secondary | ICD-10-CM | POA: Diagnosis not present

## 2020-08-27 DIAGNOSIS — Y9389 Activity, other specified: Secondary | ICD-10-CM | POA: Diagnosis not present

## 2020-08-27 DIAGNOSIS — Y999 Unspecified external cause status: Secondary | ICD-10-CM | POA: Diagnosis not present

## 2020-08-27 DIAGNOSIS — S0181XA Laceration without foreign body of other part of head, initial encounter: Secondary | ICD-10-CM | POA: Diagnosis not present

## 2020-08-27 DIAGNOSIS — I5032 Chronic diastolic (congestive) heart failure: Secondary | ICD-10-CM | POA: Diagnosis not present

## 2020-08-27 DIAGNOSIS — J449 Chronic obstructive pulmonary disease, unspecified: Secondary | ICD-10-CM | POA: Diagnosis not present

## 2020-08-27 DIAGNOSIS — W19XXXA Unspecified fall, initial encounter: Secondary | ICD-10-CM | POA: Insufficient documentation

## 2020-08-27 DIAGNOSIS — S0990XA Unspecified injury of head, initial encounter: Secondary | ICD-10-CM | POA: Diagnosis present

## 2020-08-27 DIAGNOSIS — E119 Type 2 diabetes mellitus without complications: Secondary | ICD-10-CM | POA: Diagnosis not present

## 2020-08-27 DIAGNOSIS — I130 Hypertensive heart and chronic kidney disease with heart failure and stage 1 through stage 4 chronic kidney disease, or unspecified chronic kidney disease: Secondary | ICD-10-CM | POA: Diagnosis not present

## 2020-08-27 DIAGNOSIS — S72002A Fracture of unspecified part of neck of left femur, initial encounter for closed fracture: Secondary | ICD-10-CM

## 2020-08-27 LAB — URINALYSIS, ROUTINE W REFLEX MICROSCOPIC
Bilirubin Urine: NEGATIVE
Glucose, UA: >=500 mg/dL — AB
Hgb urine dipstick: NEGATIVE
Ketones, ur: NEGATIVE mg/dL
Leukocytes,Ua: NEGATIVE
Nitrite: NEGATIVE
Protein, ur: NEGATIVE mg/dL
Specific Gravity, Urine: 1.022 (ref 1.005–1.030)
pH: 5 (ref 5.0–8.0)

## 2020-08-27 LAB — CBG MONITORING, ED: Glucose-Capillary: 341 mg/dL — ABNORMAL HIGH (ref 70–99)

## 2020-08-27 MED ORDER — LIDOCAINE-EPINEPHRINE (PF) 2 %-1:200000 IJ SOLN
10.0000 mL | Freq: Once | INTRAMUSCULAR | Status: AC
  Administered 2020-08-27: 10 mL
  Filled 2020-08-27: qty 20

## 2020-08-27 MED ORDER — LIDOCAINE 5 % EX PTCH
1.0000 | MEDICATED_PATCH | CUTANEOUS | Status: DC
  Administered 2020-08-27: 1 via TRANSDERMAL
  Filled 2020-08-27: qty 1

## 2020-08-27 NOTE — ED Notes (Signed)
Falls alarm placed under patient.

## 2020-08-27 NOTE — ED Triage Notes (Signed)
Patient BIB [facility] c/o fall at [facility] [place].  When EMS arrived to facility the staff was cleaning blood from the floor and patient's clothing had been changed.  Patient has a laceration to the left temporal area.  Bleeding is controlled. Patient was trying to change battery in his clock on the wall with a nail file.  Patient has afib but not on blood thinners.  Patient has diabetes but not on medications for it.  Per EMS, patient takes no meds at all per facility. Patient is a DNR but the facility did not send yellow paper per EMS, but it is on facilty paperwork that he is a DNR. Vitals were 414 CBG 130/76 92-HR irregular 96% RA

## 2020-08-27 NOTE — ED Notes (Signed)
Pt placed on primofit at 55mmHg.

## 2020-08-27 NOTE — ED Notes (Signed)
Patient back from xray and CT.
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
## 2020-08-27 NOTE — Discharge Instructions (Addendum)
Use Tylenol or ibuprofen as needed for pain.  You may also use Lidoderm patches to help with pain of the hip. Wash daily with soap and water. The stitches are dissolvable, they do not need to be taken out. The x-ray did show a hip fracture, I discussed this with the orthopedic doctor who reviewed the images.  It is stable and does not need surgery at this time.  He is allowed to weight-bear as tolerated, but no active hip abduction. Follow-up with your PCP as needed for further evaluation. Return to the emergency room with any new or worsening, concerning symptoms.

## 2020-08-27 NOTE — ED Notes (Signed)
Patient transported to CT 

## 2020-09-05 ENCOUNTER — Inpatient Hospital Stay (HOSPITAL_COMMUNITY)
Admission: EM | Admit: 2020-09-05 | Discharge: 2020-09-07 | DRG: 637 | Disposition: A | Discharge status: Skilled Nursing Facility | Payer: Medicare Other | Source: Skilled Nursing Facility | Attending: Internal Medicine | Admitting: Internal Medicine

## 2020-09-05 ENCOUNTER — Encounter (HOSPITAL_COMMUNITY): Payer: Self-pay

## 2020-09-05 DIAGNOSIS — I480 Paroxysmal atrial fibrillation: Secondary | ICD-10-CM | POA: Diagnosis present

## 2020-09-05 DIAGNOSIS — Z8719 Personal history of other diseases of the digestive system: Secondary | ICD-10-CM | POA: Diagnosis not present

## 2020-09-05 DIAGNOSIS — Z20822 Contact with and (suspected) exposure to covid-19: Secondary | ICD-10-CM | POA: Diagnosis present

## 2020-09-05 DIAGNOSIS — E1122 Type 2 diabetes mellitus with diabetic chronic kidney disease: Secondary | ICD-10-CM | POA: Diagnosis present

## 2020-09-05 DIAGNOSIS — Z96642 Presence of left artificial hip joint: Secondary | ICD-10-CM | POA: Diagnosis present

## 2020-09-05 DIAGNOSIS — E119 Type 2 diabetes mellitus without complications: Secondary | ICD-10-CM

## 2020-09-05 DIAGNOSIS — E785 Hyperlipidemia, unspecified: Secondary | ICD-10-CM | POA: Diagnosis present

## 2020-09-05 DIAGNOSIS — Z953 Presence of xenogenic heart valve: Secondary | ICD-10-CM

## 2020-09-05 DIAGNOSIS — F329 Major depressive disorder, single episode, unspecified: Secondary | ICD-10-CM | POA: Diagnosis present

## 2020-09-05 DIAGNOSIS — Z9181 History of falling: Secondary | ICD-10-CM

## 2020-09-05 DIAGNOSIS — Z8673 Personal history of transient ischemic attack (TIA), and cerebral infarction without residual deficits: Secondary | ICD-10-CM

## 2020-09-05 DIAGNOSIS — L859 Epidermal thickening, unspecified: Secondary | ICD-10-CM | POA: Diagnosis present

## 2020-09-05 DIAGNOSIS — Z8249 Family history of ischemic heart disease and other diseases of the circulatory system: Secondary | ICD-10-CM | POA: Diagnosis not present

## 2020-09-05 DIAGNOSIS — E7800 Pure hypercholesterolemia, unspecified: Secondary | ICD-10-CM | POA: Diagnosis present

## 2020-09-05 DIAGNOSIS — E1100 Type 2 diabetes mellitus with hyperosmolarity without nonketotic hyperglycemic-hyperosmolar coma (NKHHC): Secondary | ICD-10-CM | POA: Diagnosis present

## 2020-09-05 DIAGNOSIS — Z9842 Cataract extraction status, left eye: Secondary | ICD-10-CM

## 2020-09-05 DIAGNOSIS — Z66 Do not resuscitate: Secondary | ICD-10-CM | POA: Diagnosis present

## 2020-09-05 DIAGNOSIS — Z888 Allergy status to other drugs, medicaments and biological substances status: Secondary | ICD-10-CM

## 2020-09-05 DIAGNOSIS — N1832 Chronic kidney disease, stage 3b: Secondary | ICD-10-CM | POA: Diagnosis present

## 2020-09-05 DIAGNOSIS — R1310 Dysphagia, unspecified: Secondary | ICD-10-CM | POA: Diagnosis present

## 2020-09-05 DIAGNOSIS — I2510 Atherosclerotic heart disease of native coronary artery without angina pectoris: Secondary | ICD-10-CM | POA: Diagnosis present

## 2020-09-05 DIAGNOSIS — E1165 Type 2 diabetes mellitus with hyperglycemia: Principal | ICD-10-CM | POA: Diagnosis present

## 2020-09-05 DIAGNOSIS — N179 Acute kidney failure, unspecified: Secondary | ICD-10-CM | POA: Diagnosis present

## 2020-09-05 DIAGNOSIS — I5032 Chronic diastolic (congestive) heart failure: Secondary | ICD-10-CM | POA: Diagnosis present

## 2020-09-05 DIAGNOSIS — Z951 Presence of aortocoronary bypass graft: Secondary | ICD-10-CM | POA: Diagnosis not present

## 2020-09-05 DIAGNOSIS — J449 Chronic obstructive pulmonary disease, unspecified: Secondary | ICD-10-CM | POA: Diagnosis present

## 2020-09-05 DIAGNOSIS — E1149 Type 2 diabetes mellitus with other diabetic neurological complication: Secondary | ICD-10-CM | POA: Diagnosis present

## 2020-09-05 DIAGNOSIS — R739 Hyperglycemia, unspecified: Secondary | ICD-10-CM

## 2020-09-05 DIAGNOSIS — Z79899 Other long term (current) drug therapy: Secondary | ICD-10-CM

## 2020-09-05 DIAGNOSIS — Z9841 Cataract extraction status, right eye: Secondary | ICD-10-CM

## 2020-09-05 DIAGNOSIS — E860 Dehydration: Secondary | ICD-10-CM | POA: Diagnosis present

## 2020-09-05 DIAGNOSIS — K219 Gastro-esophageal reflux disease without esophagitis: Secondary | ICD-10-CM | POA: Diagnosis present

## 2020-09-05 DIAGNOSIS — I130 Hypertensive heart and chronic kidney disease with heart failure and stage 1 through stage 4 chronic kidney disease, or unspecified chronic kidney disease: Secondary | ICD-10-CM | POA: Diagnosis present

## 2020-09-05 DIAGNOSIS — F0150 Vascular dementia without behavioral disturbance: Secondary | ICD-10-CM | POA: Diagnosis present

## 2020-09-05 LAB — COMPREHENSIVE METABOLIC PANEL
ALT: 11 U/L (ref 0–44)
AST: 13 U/L — ABNORMAL LOW (ref 15–41)
Albumin: 3.4 g/dL — ABNORMAL LOW (ref 3.5–5.0)
Alkaline Phosphatase: 80 U/L (ref 38–126)
Anion gap: 9 (ref 5–15)
BUN: 66 mg/dL — ABNORMAL HIGH (ref 8–23)
CO2: 23 mmol/L (ref 22–32)
Calcium: 8.7 mg/dL — ABNORMAL LOW (ref 8.9–10.3)
Chloride: 103 mmol/L (ref 98–111)
Creatinine, Ser: 2.41 mg/dL — ABNORMAL HIGH (ref 0.61–1.24)
GFR, Estimated: 25 mL/min — ABNORMAL LOW (ref >60)
Glucose, Bld: 757 mg/dL (ref 70–99)
Potassium: 4.6 mmol/L (ref 3.5–5.1)
Sodium: 135 mmol/L (ref 135–145)
Total Bilirubin: 1 mg/dL (ref 0.3–1.2)
Total Protein: 6.7 g/dL (ref 6.5–8.1)

## 2020-09-05 LAB — BLOOD GAS, VENOUS
Acid-base deficit: 0.5 mmol/L (ref 0.0–2.0)
Bicarbonate: 24.5 mmol/L (ref 20.0–28.0)
O2 Saturation: 75.6 %
Patient temperature: 98.6
pCO2, Ven: 44.2 mmHg (ref 44.0–60.0)
pH, Ven: 7.363 (ref 7.250–7.430)
pO2, Ven: 42.3 mmHg (ref 32.0–45.0)

## 2020-09-05 LAB — CBC WITH DIFFERENTIAL/PLATELET
Abs Immature Granulocytes: 0.04 10*3/uL (ref 0.00–0.07)
Basophils Absolute: 0 10*3/uL (ref 0.0–0.1)
Basophils Relative: 0 %
Eosinophils Absolute: 0.1 10*3/uL (ref 0.0–0.5)
Eosinophils Relative: 1 %
HCT: 35.6 % — ABNORMAL LOW (ref 39.0–52.0)
Hemoglobin: 11.6 g/dL — ABNORMAL LOW (ref 13.0–17.0)
Immature Granulocytes: 1 %
Lymphocytes Relative: 8 %
Lymphs Abs: 0.7 10*3/uL (ref 0.7–4.0)
MCH: 31.1 pg (ref 26.0–34.0)
MCHC: 32.6 g/dL (ref 30.0–36.0)
MCV: 95.4 fL (ref 80.0–100.0)
Monocytes Absolute: 0.5 10*3/uL (ref 0.1–1.0)
Monocytes Relative: 7 %
Neutro Abs: 6.4 10*3/uL (ref 1.7–7.7)
Neutrophils Relative %: 83 %
Platelets: 191 10*3/uL (ref 150–400)
RBC: 3.73 MIL/uL — ABNORMAL LOW (ref 4.22–5.81)
RDW: 13.2 % (ref 11.5–15.5)
WBC: 7.7 10*3/uL (ref 4.0–10.5)
nRBC: 0 % (ref 0.0–0.2)

## 2020-09-05 LAB — CBG MONITORING, ED
Glucose-Capillary: >600 mg/dL (ref 70–99)
Glucose-Capillary: >600 mg/dL (ref 70–99)
Glucose-Capillary: 417 mg/dL — ABNORMAL HIGH (ref 70–99)
Glucose-Capillary: 465 mg/dL — ABNORMAL HIGH (ref 70–99)
Glucose-Capillary: 375 mg/dL — ABNORMAL HIGH (ref 70–99)
Glucose-Capillary: 417 mg/dL — ABNORMAL HIGH (ref 70–99)
Glucose-Capillary: 283 mg/dL — ABNORMAL HIGH (ref 70–99)
Glucose-Capillary: 411 mg/dL — ABNORMAL HIGH (ref 70–99)
Glucose-Capillary: 288 mg/dL — ABNORMAL HIGH (ref 70–99)
Glucose-Capillary: 269 mg/dL — ABNORMAL HIGH (ref 70–99)
Glucose-Capillary: 204 mg/dL — ABNORMAL HIGH (ref 70–99)

## 2020-09-05 LAB — URINALYSIS, ROUTINE W REFLEX MICROSCOPIC
Bilirubin Urine: NEGATIVE
Glucose, UA: >=500 mg/dL — AB
Hgb urine dipstick: NEGATIVE
Ketones, ur: 5 mg/dL — AB
Leukocytes,Ua: NEGATIVE
Nitrite: NEGATIVE
Protein, ur: NEGATIVE mg/dL
Specific Gravity, Urine: 1.026 (ref 1.005–1.030)
pH: 5 (ref 5.0–8.0)

## 2020-09-05 LAB — BASIC METABOLIC PANEL
Anion gap: 12 (ref 5–15)
BUN: 49 mg/dL — ABNORMAL HIGH (ref 8–23)
CO2: 21 mmol/L — ABNORMAL LOW (ref 22–32)
Calcium: 8.8 mg/dL — ABNORMAL LOW (ref 8.9–10.3)
Chloride: 113 mmol/L — ABNORMAL HIGH (ref 98–111)
Creatinine, Ser: 1.64 mg/dL — ABNORMAL HIGH (ref 0.61–1.24)
GFR, Estimated: 39 mL/min — ABNORMAL LOW (ref >60)
Glucose, Bld: 227 mg/dL — ABNORMAL HIGH (ref 70–99)
Potassium: 3.4 mmol/L — ABNORMAL LOW (ref 3.5–5.1)
Sodium: 146 mmol/L — ABNORMAL HIGH (ref 135–145)

## 2020-09-05 LAB — RESPIRATORY PANEL BY RT PCR (FLU A&B, COVID)
Influenza A by PCR: NEGATIVE
Influenza B by PCR: NEGATIVE
SARS Coronavirus 2 by RT PCR: NEGATIVE

## 2020-09-05 LAB — GLUCOSE, CAPILLARY
Glucose-Capillary: 236 mg/dL — ABNORMAL HIGH (ref 70–99)
Glucose-Capillary: 183 mg/dL — ABNORMAL HIGH (ref 70–99)
Glucose-Capillary: 167 mg/dL — ABNORMAL HIGH (ref 70–99)

## 2020-09-05 LAB — MRSA PCR SCREENING: MRSA by PCR: NEGATIVE

## 2020-09-05 MED ORDER — DEXTROSE 50 % IV SOLN: 0.0000 mL | INTRAVENOUS | Status: DC | PRN

## 2020-09-05 MED ORDER — CHLORHEXIDINE GLUCONATE CLOTH 2 % EX PADS
6.0000 | MEDICATED_PAD | Freq: Every day | CUTANEOUS | Status: DC
  Administered 2020-09-06 – 2020-09-07 (×2): 6 via TOPICAL

## 2020-09-05 MED ORDER — INSULIN REGULAR(HUMAN) IN NACL 100-0.9 UT/100ML-% IV SOLN
INTRAVENOUS | Status: DC
  Administered 2020-09-05: 5 [IU]/h via INTRAVENOUS
  Administered 2020-09-05: 5.5 [IU]/h via INTRAVENOUS
  Administered 2020-09-05: 2.6 [IU]/h via INTRAVENOUS
  Administered 2020-09-05: 3.2 [IU]/h via INTRAVENOUS
  Filled 2020-09-05: qty 100

## 2020-09-05 MED ORDER — SODIUM CHLORIDE 0.9 % IV BOLUS
1000.0000 mL | Freq: Once | INTRAVENOUS | Status: AC
  Administered 2020-09-05: 1000 mL via INTRAVENOUS

## 2020-09-05 MED ORDER — LACTATED RINGERS IV SOLN: INTRAVENOUS | Status: DC

## 2020-09-05 MED ORDER — DEXTROSE IN LACTATED RINGERS 5 % IV SOLN: INTRAVENOUS | Status: DC
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
## 2020-09-05 NOTE — ED Notes (Signed)
Attempted to call and give report

## 2020-09-05 NOTE — ED Triage Notes (Addendum)
Bib ems for altered mental and hyperglycemia. Ems glucometer read "high". Pt is diet controled diabetic
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
## 2020-09-05 NOTE — ED Notes (Addendum)
Glucometer not crossing over.  Took pts blood sugar just now and it read "HI" still.

## 2020-09-06 DIAGNOSIS — E1165 Type 2 diabetes mellitus with hyperglycemia: Secondary | ICD-10-CM | POA: Diagnosis not present

## 2020-09-06 DIAGNOSIS — E1100 Type 2 diabetes mellitus with hyperosmolarity without nonketotic hyperglycemic-hyperosmolar coma (NKHHC): Secondary | ICD-10-CM | POA: Diagnosis not present

## 2020-09-06 LAB — GLUCOSE, CAPILLARY
Glucose-Capillary: 133 mg/dL — ABNORMAL HIGH (ref 70–99)
Glucose-Capillary: 153 mg/dL — ABNORMAL HIGH (ref 70–99)
Glucose-Capillary: 161 mg/dL — ABNORMAL HIGH (ref 70–99)
Glucose-Capillary: 163 mg/dL — ABNORMAL HIGH (ref 70–99)
Glucose-Capillary: 166 mg/dL — ABNORMAL HIGH (ref 70–99)
Glucose-Capillary: 169 mg/dL — ABNORMAL HIGH (ref 70–99)
Glucose-Capillary: 186 mg/dL — ABNORMAL HIGH (ref 70–99)
Glucose-Capillary: 205 mg/dL — ABNORMAL HIGH (ref 70–99)
Glucose-Capillary: 268 mg/dL — ABNORMAL HIGH (ref 70–99)
Glucose-Capillary: 272 mg/dL — ABNORMAL HIGH (ref 70–99)
Glucose-Capillary: 431 mg/dL — ABNORMAL HIGH (ref 70–99)

## 2020-09-06 LAB — CBC
HCT: 38.2 % — ABNORMAL LOW (ref 39.0–52.0)
Hemoglobin: 12.1 g/dL — ABNORMAL LOW (ref 13.0–17.0)
MCH: 31.3 pg (ref 26.0–34.0)
MCHC: 31.7 g/dL (ref 30.0–36.0)
MCV: 99 fL (ref 80.0–100.0)
Platelets: 145 10*3/uL — ABNORMAL LOW (ref 150–400)
RBC: 3.86 MIL/uL — ABNORMAL LOW (ref 4.22–5.81)
RDW: 13.2 % (ref 11.5–15.5)
WBC: 9 10*3/uL (ref 4.0–10.5)
nRBC: 0 % (ref 0.0–0.2)

## 2020-09-06 LAB — HEMOGLOBIN A1C
Hgb A1c MFr Bld: 12.1 % — ABNORMAL HIGH (ref 4.8–5.6)
Mean Plasma Glucose: 300.57 mg/dL

## 2020-09-06 LAB — BASIC METABOLIC PANEL
Anion gap: 11 (ref 5–15)
BUN: 39 mg/dL — ABNORMAL HIGH (ref 8–23)
CO2: 24 mmol/L (ref 22–32)
Calcium: 8.8 mg/dL — ABNORMAL LOW (ref 8.9–10.3)
Chloride: 110 mmol/L (ref 98–111)
Creatinine, Ser: 1.48 mg/dL — ABNORMAL HIGH (ref 0.61–1.24)
GFR, Estimated: 44 mL/min — ABNORMAL LOW (ref >60)
Glucose, Bld: 212 mg/dL — ABNORMAL HIGH (ref 70–99)
Potassium: 3.9 mmol/L (ref 3.5–5.1)
Sodium: 145 mmol/L (ref 135–145)

## 2020-09-06 MED ORDER — INSULIN ASPART 100 UNIT/ML ~~LOC~~ SOLN
0.0000 [IU] | Freq: Three times a day (TID) | SUBCUTANEOUS | Status: DC
  Administered 2020-09-06 (×2): 2 [IU] via SUBCUTANEOUS
  Administered 2020-09-07 (×2): 1 [IU] via SUBCUTANEOUS

## 2020-09-06 MED ORDER — ONDANSETRON HCL 4 MG PO TABS: 4.0000 mg | ORAL_TABLET | Freq: Four times a day (QID) | ORAL | Status: DC | PRN

## 2020-09-06 MED ORDER — INSULIN GLARGINE 100 UNIT/ML ~~LOC~~ SOLN
20.0000 [IU] | Freq: Every day | SUBCUTANEOUS | Status: DC
  Administered 2020-09-06: 20 [IU] via SUBCUTANEOUS
  Filled 2020-09-06: qty 0.2

## 2020-09-06 MED ORDER — FLUOXETINE HCL 20 MG PO CAPS
20.0000 mg | ORAL_CAPSULE | Freq: Every day | ORAL | Status: DC
  Administered 2020-09-06 – 2020-09-07 (×2): 20 mg via ORAL
  Filled 2020-09-06 (×2): qty 1

## 2020-09-06 MED ORDER — INSULIN GLARGINE 100 UNIT/ML ~~LOC~~ SOLN: 8.0000 [IU] | Freq: Every day | SUBCUTANEOUS | Status: DC

## 2020-09-06 MED ORDER — INSULIN GLARGINE 100 UNIT/ML ~~LOC~~ SOLN
8.0000 [IU] | Freq: Every day | SUBCUTANEOUS | Status: DC
  Administered 2020-09-06: 8 [IU] via SUBCUTANEOUS
  Filled 2020-09-06: qty 0.08

## 2020-09-06 MED ORDER — UMECLIDINIUM BROMIDE 62.5 MCG/INH IN AEPB
1.0000 | INHALATION_SPRAY | Freq: Every day | RESPIRATORY_TRACT | Status: DC
  Administered 2020-09-06 – 2020-09-07 (×2): 1 via RESPIRATORY_TRACT
  Filled 2020-09-06: qty 7

## 2020-09-06 MED ORDER — INSULIN GLARGINE 100 UNIT/ML ~~LOC~~ SOLN
10.0000 [IU] | Freq: Every day | SUBCUTANEOUS | Status: DC
  Filled 2020-09-06: qty 0.1

## 2020-09-06 MED ORDER — ACETAMINOPHEN 325 MG PO TABS: 650.0000 mg | ORAL_TABLET | Freq: Four times a day (QID) | ORAL | Status: DC | PRN

## 2020-09-06 MED ORDER — SIMETHICONE 80 MG PO CHEW
80.0000 mg | CHEWABLE_TABLET | Freq: Three times a day (TID) | ORAL | Status: DC
  Administered 2020-09-06 – 2020-09-07 (×6): 80 mg via ORAL
  Filled 2020-09-06 (×6): qty 1

## 2020-09-06 MED ORDER — TIOTROPIUM BROMIDE MONOHYDRATE 18 MCG IN CAPS: 18.0000 ug | ORAL_CAPSULE | Freq: Every day | RESPIRATORY_TRACT | Status: DC

## 2020-09-06 MED ORDER — INSULIN ASPART 100 UNIT/ML IV SOLN
10.0000 [IU] | Freq: Once | INTRAVENOUS | Status: AC
  Administered 2020-09-06: 10 [IU] via INTRAVENOUS

## 2020-09-06 MED ORDER — SODIUM CHLORIDE 0.9 % IV SOLN: INTRAVENOUS | Status: DC

## 2020-09-06 MED ORDER — MIRTAZAPINE 7.5 MG PO TABS
7.5000 mg | ORAL_TABLET | Freq: Every day | ORAL | Status: DC
  Administered 2020-09-06: 7.5 mg via ORAL
  Filled 2020-09-06: qty 1

## 2020-09-06 MED ORDER — INSULIN ASPART 100 UNIT/ML ~~LOC~~ SOLN
0.0000 [IU] | Freq: Every day | SUBCUTANEOUS | Status: DC
  Administered 2020-09-06: 3 [IU] via SUBCUTANEOUS

## 2020-09-06 MED ORDER — HEPARIN SODIUM (PORCINE) 5000 UNIT/ML IJ SOLN
5000.0000 [IU] | Freq: Three times a day (TID) | INTRAMUSCULAR | Status: DC
  Administered 2020-09-06 – 2020-09-07 (×4): 5000 [IU] via SUBCUTANEOUS
  Filled 2020-09-06 (×3): qty 1

## 2020-09-06 NOTE — NC FL2 (Signed)
[place] LEVEL OF CARE SCREENING TOOL     IDENTIFICATION  Patient Name: Eric [name] Birthdate: 05-Dec-1926 Sex: male Admission Date (Current Location): 09/05/2020  [facility] and [place] Number:  Herbalist and Address:  [facility],  [place] [address], [place]      Provider Number: [id number]  Attending Physician Name and Address:  Shelly Coss, MD  Relative Name and Phone Number:       Current Level of Care: Hospital Recommended Level of Care: [place] Prior Approval Number:    Date Approved/Denied:   PASRR Number: [id number]  Discharge Plan: SNF    Current Diagnoses: Patient Active Problem List   Diagnosis Date Noted  . Hyperosmolar hyperglycemic state (HHS) ([place]) 09/05/2020  . Abnormal CT of the abdomen 05/19/2020  . Dilated bile duct 05/19/2020  . Abnormal MRI, liver 05/19/2020  . Pressure injury of skin 01/30/2020  . Choledocholithiasis   . Fall   . Hypoglycemia   . Atrial fibrillation ([place])   . Abnormal liver diagnostic imaging   . Severe protein-calorie malnutrition ([place]) 01/06/2018  . Fall at nursing home 01/05/2018  . Arthritis of left wrist 12/28/2017  . Hypokalemia 11/26/2017  . Weight loss, non-intentional 11/24/2017  . Acute pain of right shoulder 11/24/2017  . CAD (coronary artery disease), native coronary artery 11/02/2017  . Vascular dementia with behavior disturbance ([place]) 11/02/2017  . Chronic constipation 11/02/2017  . Chronic diastolic CHF (congestive heart failure) ([place]) 10/28/2017  . Tachycardia 10/25/2017  . Major depressive disorder, single episode, severe without psychotic features ([place]) 10/09/2017  . Stroke ([place])   . History of aortic stenosis   . Foraminal stenosis of cervical region   . Essential hypertension, benign   . Colon polyp   . Cervicalgia   . Cerebrovascular disease, unspecified   . Vitamin B12 deficiency 06/02/2016  . Anemia, iron deficiency  02/28/2016  . Dyslipidemia associated with type 2 diabetes mellitus ([place])   . Sepsis ([place])   . S/P CABG (coronary artery bypass graft) 12/25/2015  . S/P AVR (aortic valve replacement) 12/25/2015  . Atrial fibrillation with RVR ([place]) 12/25/2015  . COPD (chronic obstructive pulmonary disease) ([place]) 10/21/2015  . GERD (gastroesophageal reflux disease) 07/08/2014  . Status post total replacement of left hip 06/30/2014  . Medicare annual wellness visit, subsequent 03/08/2012  . CKD (chronic kidney disease) stage 3, GFR 30-59 ml/min ([name] 09/24/2010  . BPH (benign prostatic hypertrophy) with urinary obstruction 01/10/2010  . Bilateral hydrocele 12/18/2009  . History of CVA (cerebrovascular accident) 02/28/2009  . Insulin dependent diabetes mellitus with complications ([place]) 46/27/0350  . Diabetic neuropathy associated with type 2 diabetes mellitus ([place]) 12/17/2006    Orientation RESPIRATION BLADDER Height & Weight     Time, Self, Situation, Place  Normal Continent Weight: 61.4 kg Height:  5\' 9"  (175.3 cm)  BEHAVIORAL SYMPTOMS/MOOD NEUROLOGICAL BOWEL NUTRITION STATUS      Continent Diet (regular)  AMBULATORY STATUS COMMUNICATION OF NEEDS Skin   Extensive Assist Verbally Normal                       Personal Care Assistance Level of Assistance  Bathing, Feeding, Dressing Bathing Assistance: Limited assistance Feeding assistance: Limited assistance Dressing Assistance: Limited assistance     Functional Limitations Info  Sight, Hearing, Speech Sight Info: Adequate Hearing Info: Adequate Speech Info: Adequate    SPECIAL CARE FACTORS FREQUENCY  PT (By licensed PT), OT (By licensed OT)  PT Frequency: 5 x weekly OT Frequency: 5 x weekly            Contractures Contractures Info: Not present    Additional Factors Info  Code Status Code Status Info: DNR             Current Medications (09/06/2020):  This is the current hospital active medication list Current  Facility-Administered Medications  Medication Dose Route Frequency Provider Last Rate Last Admin  . 0.9 %  sodium chloride infusion   Intravenous Continuous Shelly Coss, MD 75 mL/hr at 09/06/20 1000 Rate Verify at 09/06/20 1000  . acetaminophen (TYLENOL) tablet 650 mg  650 mg Oral Q6H PRN Shelly Coss, MD      . Chlorhexidine Gluconate Cloth 2 % PADS 6 each  6 each Topical Daily Kyle, Tyrone A, DO      . dextrose 50 % solution 0-50 mL  0-50 mL Intravenous PRN Marylyn Ishihara, Tyrone A, DO      . FLUoxetine (PROZAC) capsule 20 mg  20 mg Oral Daily Kyle, Tyrone A, DO   20 mg at 09/06/20 0811  . heparin injection 5,000 Units  5,000 Units Subcutaneous Q8H Kyle, Tyrone A, DO   5,000 Units at 09/06/20 0819  . insulin aspart (novoLOG) injection 0-5 Units  0-5 Units Subcutaneous QHS Lang Snow, FNP      . insulin aspart (novoLOG) injection 0-9 Units  0-9 Units Subcutaneous TID WC Lang Snow, FNP   2 Units at 09/06/20 [phone]  . insulin glargine (LANTUS) injection 10 Units  10 Units Subcutaneous QHS Adhikari, Amrit, MD      . mirtazapine (REMERON) tablet 7.5 mg  7.5 mg Oral QHS Kyle, Tyrone A, DO      . ondansetron (ZOFRAN) tablet 4 mg  4 mg Oral Q6H PRN Marylyn Ishihara, Tyrone A, DO      . simethicone (MYLICON) chewable tablet 80 mg  80 mg Oral TID AC & HS Kyle, Tyrone A, DO   80 mg at 09/06/20 0811  . umeclidinium bromide (INCRUSE ELLIPTA) 62.5 MCG/INH 1 puff  1 puff Inhalation Daily Shelly Coss, MD   1 puff at 09/06/20 0820     Discharge Medications: Please see discharge summary for a list of discharge medications.  Relevant Imaging Results:  Relevant Lab Results:   Additional Information SS# [id number]  Leeroy Cha, RN

## 2020-09-06 NOTE — Progress Notes (Signed)
PROGRESS NOTE    Eric [name]  VEL:[id number] DOB: 01-18-1927 DOA: 09/05/2020 PCP: Jodi Marble, MD   Chief Complain: Fatigue  Brief Narrative: Patient is a 86 year old male with history of type diabetes mellitus type 2,dementia, paroxysmal A. fib who was brought to the emergency department from [place] facility for the evaluation of hyperglycemia. He had increased urinary frequency for last few days.On presentation glucose was 757. He was started on insulin drip. Hemoglobin A1c noted to be in the range of 12.  Assessment & Plan:   Active Problems:   Hyperosmolar hyperglycemic state (HHS) ([place])   Uncontrolled diabetes mellitus with hyperglycemia:bood glucose of 757 on presentation. Started on insulin drip on admission. Anion gap is normal. Will change the insulin to Lantus and sliding scale. Diabetic coordinator consulted, hemoglobin A1c of 12. Needs to be on insulin on discharge. As per the daughter, he was on insulin for years but it was recently stopped at nursing facility. Monitor blood sugars  AKI on CKD stage IIIb: Baseline creatinine about 1.4. AKI on presentation most likely secondary to dehydration from polyuria. Kidney function currently near baseline.  Continue gentle IV fluids  Paroxysmal A. fib: Currently not on anticoagulation. Previously taking Eliquis. Stopped due to history of falls. Not on rate controlling agents either. Currently heart rate is stable and he is in normal sinus rhythm  Dementia/depression: Has history of vascular dementia. Continue supportive care. Continue Prozac, Remeron.  Hypertension: Home antihypertensives on hold due to soft blood pressure. Continue to monitor blood pressure  COPD: Continue home inhalers Spiriva. Continue bronchodilators as needed.  History of dysphagia: On mechanical soft diet with thin liquid.  Debility/deconditioning: Skilled nursing facility resident. Will request a PT/OT evaluation.         DVT prophylaxis:Heparin [place] Code Status: DNR Family Communication: Called and discussed with daughter on phone on 09/06/2020 Status is: Inpatient  Remains inpatient appropriate because:Inpatient level of care appropriate due to severity of illness   Dispo: The patient is from: SNF              Anticipated d/c is to: SNF              Anticipated d/c date is: 1 day              Patient currently is not medically stable to d/c.   Consultants: None  Procedures:None  Antimicrobials:  Anti-infectives (From admission, onward)   None      Subjective: Patient seen and examined at the bedside this morning.  Looks very comfortable.  Hemodynamically stable.  Blood sugars are much better.  He is alert, awake and communicates but not oriented.  Objective: Vitals:   09/06/20 0400 09/06/20 0518 09/06/20 0600 09/06/20 0621  BP: (!) 100/57 (!) 118/48 (!) 89/51 (!) 117/56  Pulse: (!) 56 63 60 60  Resp: 16 15 13 17   Temp:  98.2 F (36.8 C)    TempSrc:  Oral    SpO2: 99% 100% 97% 98%  Weight:      Height:        Intake/Output Summary (Last 24 hours) at 09/06/2020 0750 Last data filed at 09/06/2020 0500 Gross per 24 hour  Intake 2744.47 ml  Output 450 ml  Net 2294.47 ml   Filed Weights   09/06/20 0000  Weight: 61.4 kg    Examination:  General exam: Pleasantly confused elderly gentleman, comfortable HEENT:PERRL,Oral mucosa moist, Ear/Nose normal on gross exam Respiratory system: Bilateral equal air entry, normal vesicular  breath sounds, no wheezes or crackles  Cardiovascular system: S1 & S2 heard, RRR. No JVD, murmurs, rubs, gallops or clicks. No pedal edema. Gastrointestinal system: Abdomen is nondistended, soft and nontender. No organomegaly or masses felt. Normal bowel sounds heard. Central nervous system: Alert and awake but not oriented Extremities: No edema, no clubbing ,no cyanosis Skin: No rashes, lesions or ulcers,no icterus ,no pallor  Data Reviewed: I have  personally reviewed following labs and imaging studies  CBC: Recent Labs  Lab 09/05/20 1030  WBC 7.7  NEUTROABS 6.4  HGB 11.6*  HCT 35.6*  MCV 95.4  PLT 283   Basic Metabolic Panel: Recent Labs  Lab 09/05/20 1030 09/05/20 2256  NA 135 146*  K 4.6 3.4*  CL 103 113*  CO2 23 21*  GLUCOSE 757* 227*  BUN 66* 49*  CREATININE 2.41* 1.64*  CALCIUM 8.7* 8.8*   GFR: Estimated Creatinine Clearance: 25.5 mL/min (A) (by C-G formula based on SCr of 1.64 mg/dL (H)). Liver Function Tests: Recent Labs  Lab 09/05/20 1030  AST 13*  ALT 11  ALKPHOS 80  BILITOT 1.0  PROT 6.7  ALBUMIN 3.4*   No results for input(s): LIPASE, AMYLASE in the last 168 hours. No results for input(s): AMMONIA in the last 168 hours. Coagulation Profile: No results for input(s): INR, PROTIME in the last 168 hours. Cardiac Enzymes: No results for input(s): CKTOTAL, CKMB, CKMBINDEX, TROPONINI in the last 168 hours. BNP (last 3 results) No results for input(s): PROBNP in the last 8760 hours. HbA1C: Recent Labs    09/06/20 0221  HGBA1C 12.1*   CBG: Recent Labs  Lab 09/06/20 0127 09/06/20 0241 09/06/20 0348 09/06/20 0512 09/06/20 0733  GLUCAP 133* 153* 163* 169* 186*   Lipid Profile: No results for input(s): CHOL, HDL, LDLCALC, TRIG, CHOLHDL, LDLDIRECT in the last 72 hours. Thyroid Function Tests: No results for input(s): TSH, T4TOTAL, FREET4, T3FREE, THYROIDAB in the last 72 hours. Anemia Panel: No results for input(s): VITAMINB12, FOLATE, FERRITIN, TIBC, IRON, RETICCTPCT in the last 72 hours. Sepsis Labs: No results for input(s): PROCALCITON, LATICACIDVEN in the last 168 hours.  Recent Results (from the past 240 hour(s))  Respiratory Panel by RT PCR (Flu A&B, Covid) - Nasopharyngeal Swab     Status: None   Collection Time: 09/05/20 10:44 AM   Specimen: Nasopharyngeal Swab  Result Value Ref Range Status   SARS Coronavirus 2 by RT PCR NEGATIVE NEGATIVE Final    Comment: (NOTE) SARS-CoV-2  target nucleic acids are NOT DETECTED.  The SARS-CoV-2 RNA is generally detectable in upper respiratoy specimens during the acute phase of infection. The lowest concentration of SARS-CoV-2 viral copies this assay can detect is 131 copies/mL. A negative result does not preclude SARS-Cov-2 infection and should not be used as the sole basis for treatment or other patient management decisions. A negative result may occur with  improper specimen collection/handling, submission of specimen other than nasopharyngeal swab, presence of viral mutation(s) within the areas targeted by this assay, and inadequate number of viral copies (<131 copies/mL). A negative result must be combined with clinical observations, patient history, and epidemiological information. The expected result is Negative.  Fact Sheet for Patients:  PinkCheek.be  Fact Sheet for Healthcare Providers:  GravelBags.it  This test is no t yet approved or cleared by the [place] FDA and  has been authorized for detection and/or diagnosis of SARS-CoV-2 by FDA under an Emergency Use Authorization (EUA). This EUA will remain  in effect (meaning this test can  be used) for the duration of the COVID-19 declaration under Section 564(b)(1) of the Act, 21 U.S.C. section 360bbb-3(b)(1), unless the authorization is terminated or revoked sooner.     Influenza A by PCR NEGATIVE NEGATIVE Final   Influenza B by PCR NEGATIVE NEGATIVE Final    Comment: (NOTE) The Xpert Xpress SARS-CoV-2/FLU/RSV assay is intended as an aid in  the diagnosis of influenza from Nasopharyngeal swab specimens and  should not be used as a sole basis for treatment. Nasal washings and  aspirates are unacceptable for Xpert Xpress SARS-CoV-2/FLU/RSV  testing.  Fact Sheet for Patients: PinkCheek.be  Fact Sheet for Healthcare  Providers: GravelBags.it  This test is not yet approved or cleared by the [place] FDA and  has been authorized for detection and/or diagnosis of SARS-CoV-2 by  FDA under an Emergency Use Authorization (EUA). This EUA will remain  in effect (meaning this test can be used) for the duration of the  Covid-19 declaration under Section 564(b)(1) of the Act, 21  U.S.C. section 360bbb-3(b)(1), unless the authorization is  terminated or revoked. Performed at [facility], [place] [address], [place], [place] [address]   MRSA PCR Screening     Status: None   Collection Time: 09/05/20  9:36 PM   Specimen: Nasal Mucosa; Nasopharyngeal  Result Value Ref Range Status   MRSA by PCR NEGATIVE NEGATIVE Final    Comment:        The GeneXpert MRSA Assay (FDA approved for NASAL specimens only), is one component of a comprehensive MRSA colonization surveillance program. It is not intended to diagnose MRSA infection nor to guide or monitor treatment for MRSA infections. Performed at [facility], [place] [address] [place],  [address]          Radiology Studies: No results found.      Scheduled Meds: . Chlorhexidine Gluconate Cloth  6 each Topical Daily  . FLUoxetine  20 mg Oral Daily  . heparin  5,000 Units Subcutaneous Q8H  . insulin aspart  0-5 Units Subcutaneous QHS  . insulin aspart  0-9 Units Subcutaneous TID WC  . insulin glargine  8 Units Subcutaneous QHS  . mirtazapine  7.5 mg Oral QHS  . simethicone  80 mg Oral TID AC & HS  . umeclidinium bromide  1 puff Inhalation Daily   Continuous Infusions:   LOS: 1 day    Time spent: 35 mins.More than 50% of that time was spent in counseling and/or coordination of care.      Shelly Coss, MD [facility] P11/18/2021, 7:50 AM

## 2020-09-06 NOTE — TOC Initial Note (Addendum)
Transition of Care [facility]) - Initial/Assessment Note    Patient Details  Name: Eric [name] MRN: [id number] Date of Birth: 09-28-1927  [facility]) CM/SW Contact:    Leeroy Cha, RN Phone Number: 09/06/2020, 7:35 AM  Clinical Narrative:                  86 y.o. male with medical history significant of DM2   He is not sure why he is in the hospital. Hx is mainly from chart review. Apparently he was sent from [facility] with high blood sugars. His testing at the center resulted in "high". No specific numbers given. They do report that he has had increase urinary frequency over the last several days. He has been prescribed insulin in the past, but currently is diet controlled. [place] [facility] Ramus was concerned with his glucose and sent him to the ED.    ED Course: Glucose was found to be 757. He was started on an insulin gtt. Plan is to return to [facility] when stable Following for progression HGBA1c-12.1, bld glucose 227 Transitioned to sub insulin .  fl2 sent to [facility] for return to snf on [id number] tct-Tina Charles at C.P.=message left on recorder that patient should be ready to return on [id number]. Expected Discharge Plan: Skilled Nursing Facility Barriers to Discharge: No Barriers Identified   Patient Goals and CMS Choice Patient states their goals for this hospitalization and ongoing recovery are:: I want to go back to the snf CMS Medicare.gov Compare Post Acute Care list provided to:: Patient    Expected Discharge Plan and Services Expected Discharge Plan: [place]   Discharge Planning Services: CM Consult Post Acute Care Choice: [place] Living arrangements for the past 2 months: [place]                                      Prior Living Arrangements/Services Living arrangements for the past 2 months: [place] Lives with:: Facility Resident Patient language and need  for interpreter reviewed:: Yes Do you feel safe going back to the place where you live?: Yes      Need for Family Participation in Patient Care: Yes (Comment) Care giver support system in place?: Yes (comment)   Criminal Activity/Legal Involvement Pertinent to Current Situation/Hospitalization: No - Comment as needed  Activities of Daily Living Home Assistive Devices/Equipment: Blood pressure cuff, Eyeglasses, Grab bars around toilet, Grab bars in shower, Hand-held shower hose, Hospital bed, Oxygen, Scales, Walker (specify type), Wheelchair, CBG Meter ADL Screening (condition at time of admission) Patient's cognitive ability adequate to safely complete daily activities?: No Is the patient deaf or have difficulty hearing?: Yes Does the patient have difficulty seeing, even when wearing glasses/contacts?: No Does the patient have difficulty concentrating, remembering, or making decisions?: Yes Patient able to express need for assistance with ADLs?: No Does the patient have difficulty dressing or bathing?: Yes Independently performs ADLs?: No Communication: Independent Dressing (OT): Dependent Is this a change from baseline?: Change from baseline, expected to last >3 days Grooming: Needs assistance Is this a change from baseline?: Change from baseline, expected to last >3 days Feeding: Needs assistance Is this a change from baseline?: Change from baseline, expected to last >3 days Bathing: Needs assistance Is this a change from baseline?: Change from baseline, expected to last >3 days Toileting: Needs assistance Is this a change from  baseline?: Change from baseline, expected to last >3days In/Out Bed: Dependent Is this a change from baseline?: Change from baseline, expected to last >3 days Walks in Home: Dependent Is this a change from baseline?: Change from baseline, expected to last >3 days Does the patient have difficulty walking or climbing stairs?: Yes (secondary to  weakness) Weakness of Legs: Both Weakness of Arms/Hands: Both  Permission Sought/Granted                  Emotional Assessment Appearance:: Appears stated age Attitude/Demeanor/Rapport: Engaged Affect (typically observed): Calm Orientation: : Oriented to Self, Oriented to Place, Oriented to  Time, Oriented to Situation Alcohol / Substance Use: Not Applicable Psych Involvement: No (comment)  Admission diagnosis:  Hyperglycemia [R73.9] Hyperosmolar hyperglycemic state (HHS) ([place]) [E11.00, E11.65] Patient Active Problem List   Diagnosis Date Noted  . Hyperosmolar hyperglycemic state (HHS) ([place]) 09/05/2020  . Abnormal CT of the abdomen 05/19/2020  . Dilated bile duct 05/19/2020  . Abnormal MRI, liver 05/19/2020  . Pressure injury of skin 01/30/2020  . Choledocholithiasis   . Fall   . Hypoglycemia   . Atrial fibrillation ([place])   . Abnormal liver diagnostic imaging   . Severe protein-calorie malnutrition ([place]) 01/06/2018  . Fall at nursing home 01/05/2018  . Arthritis of left wrist 12/28/2017  . Hypokalemia 11/26/2017  . Weight loss, non-intentional 11/24/2017  . Acute pain of right shoulder 11/24/2017  . CAD (coronary artery disease), native coronary artery 11/02/2017  . Vascular dementia with behavior disturbance ([place]) 11/02/2017  . Chronic constipation 11/02/2017  . Chronic diastolic CHF (congestive heart failure) ([place]) 10/28/2017  . Tachycardia 10/25/2017  . Major depressive disorder, single episode, severe without psychotic features ([place]) 10/09/2017  . Stroke ([place])   . History of aortic stenosis   . Foraminal stenosis of cervical region   . Essential hypertension, benign   . Colon polyp   . Cervicalgia   . Cerebrovascular disease, unspecified   . Vitamin B12 deficiency 06/02/2016  . Anemia, iron deficiency 02/28/2016  . Dyslipidemia associated with type 2 diabetes mellitus ([place])   . Sepsis ([place])   . S/P CABG (coronary artery bypass graft) 12/25/2015  . S/P AVR  (aortic valve replacement) 12/25/2015  . Atrial fibrillation with RVR ([place]) 12/25/2015  . COPD (chronic obstructive pulmonary disease) ([place]) 10/21/2015  . GERD (gastroesophageal reflux disease) 07/08/2014  . Status post total replacement of left hip 06/30/2014  . Medicare annual wellness visit, subsequent 03/08/2012  . CKD (chronic kidney disease) stage 3, GFR 30-59 ml/min ([name] 09/24/2010  . BPH (benign prostatic hypertrophy) with urinary obstruction 01/10/2010  . Bilateral hydrocele 12/18/2009  . History of CVA (cerebrovascular accident) 02/28/2009  . Insulin dependent diabetes mellitus with complications ([place]) 89/37/3428  . Diabetic neuropathy associated with type 2 diabetes mellitus ([place]) 12/17/2006   PCP:  Jodi Marble, MD Pharmacy:   [facility] - [address] [place] [address] Phone: [phone] Fax: [phone]  [place], [place], [place] [address], [place] [address] Phone: [phone] Fax: [phone]  [facility] [place], [place] [address] [place] [address] Phone: [phone] Fax: [place], [place] [address]  Phone: [phone] Fax: [phone]  [place], [place] [address] Phone: [phone] Fax: [phone]  [place], [place] [address] [place] [address] Phone: [phone] Fax: [phone]     Social Determinants of Health ([place]) Interventions    Readmission Risk Interventions No flowsheet data found.

## 2020-09-06 NOTE — Evaluation (Signed)
Physical Therapy Evaluation Patient Details Name: Eric [name] MRN: [id number] DOB: [birth year] Today's Date: 09/06/2020   History of Present Illness  Patient is a 86 year old male with history of type diabetes mellitus type 2,dementia, paroxysmal A. fib who was brought to the emergency department from [facility] facility for the evaluation of hyperglycemia  Clinical Impression  Patient found sitting forward in recliner at raised legrest. Assisted   To stand with 2 armhold and pivot  To bed. Decreased weight bearing. Patient indicates L hip is painful. Patient had  A recent fall, R/O any left hip fxs per RN. Patient comes form SNF and  Plans to return. Patient appears to be WC dependent. Pt admitted with above diagnosis.  Pt currently with functional limitations due to the deficits listed below (see PT Problem List). Pt will benefit from skilled PT to increase their independence and safety with mobility to allow discharge to the venue listed below.      Follow Up Recommendations No PT follow up (return to [facility] SNF)    Equipment Recommendations  None recommended by PT    Recommendations for Other Services       Precautions / Restrictions Precautions Precautions: Fall      Mobility  Bed Mobility Overal bed mobility: Needs Assistance Bed Mobility: Sit to Supine       Sit to supine: Max assist   General bed mobility comments: assist with legs and trunk    Transfers Overall transfer level: Needs assistance Equipment used: 2 person hand held assist Transfers: Sit to/from Stand;Stand Pivot Transfers Sit to Stand: Max assist;+2 physical assistance;+2 safety/equipment Stand pivot transfers: Max assist;+2 physical assistance;+2 safety/equipment       General transfer comment: assisted to stand from recliner and pivot to the bed, decreased  weight through legs  Ambulation/Gait             General Gait Details: NT  Stairs            Wheelchair  Mobility    Modified Rankin (Stroke Patients Only)       Balance Overall balance assessment: Needs assistance;History of Falls Sitting-balance support: Bilateral upper extremity supported;Feet supported Sitting balance-Leahy Scale: Fair Sitting balance - Comments: sitting upright in recliner   Standing balance support: During functional activity;Bilateral upper extremity supported Standing balance-Leahy Scale: Poor Standing balance comment: requires support                             Pertinent Vitals/Pain Pain Assessment: Faces Faces Pain Scale: Hurts whole lot Pain Location: L hip Pain Descriptors / Indicators: Grimacing;Guarding;Jabbing Pain Intervention(s): Monitored during session;Repositioned    Home Living Family/patient expects to be discharged to:: Skilled nursing facility                 Additional Comments: Pt from [place] per chart.    Prior Function           Comments: appears more WC dependent at facility     Hand Dominance        Extremity/Trunk Assessment   Upper Extremity Assessment Upper Extremity Assessment: Generalized weakness    Lower Extremity Assessment Lower Extremity Assessment: LLE deficits/detail LLE Deficits / Details: indicates discomfort to move the hip through ROM    Cervical / Trunk Assessment Cervical / Trunk Assessment: Kyphotic  Communication   Communication: No difficulties  Cognition Arousal/Alertness: Awake/alert Behavior During Therapy: WFL for tasks assessed/performed Overall Cognitive Status: No  family/caregiver present to determine baseline cognitive functioning Area of Impairment: Orientation                 Orientation Level: Situation             General Comments: knows its nov, in [facility],      General Comments      Exercises     Assessment/Plan    PT Assessment Patient needs continued PT services  PT Problem List Decreased strength;Decreased activity  tolerance;Decreased mobility;Decreased knowledge of precautions;Decreased safety awareness       PT Treatment Interventions Functional mobility training;Therapeutic activities;Therapeutic exercise;Patient/family education    PT Goals (Current goals can be found in the Care Plan section)  Acute Rehab PT Goals Patient Stated Goal: to get back into bed PT Goal Formulation: With patient Time For Goal Achievement: 09/20/20 Potential to Achieve Goals: Fair    Frequency Min 2X/week   Barriers to discharge        Co-evaluation               AM-PAC PT "6 Clicks" Mobility  Outcome Measure Help needed turning from your back to your side while in a flat bed without using bedrails?: A Lot Help needed moving from lying on your back to sitting on the side of a flat bed without using bedrails?: A Lot Help needed moving to and from a bed to a chair (including a wheelchair)?: A Lot Help needed standing up from a chair using your arms (e.g., wheelchair or bedside chair)?: Total Help needed to walk in hospital room?: Total Help needed climbing 3-5 steps with a railing? : Total 6 Click Score: 9    End of Session   Activity Tolerance: Patient tolerated treatment well Patient left: in bed;with call bell/phone within reach;with bed alarm set Nurse Communication: Mobility status PT Visit Diagnosis: Unsteadiness on feet (R26.81)    Time: 8675-4492 PT Time Calculation (min) (ACUTE ONLY): 18 min   Charges:   PT Evaluation $PT Eval Low Complexity: [place] Pager [phone] Office [phone]   Claretha Cooper 09/06/2020, 2:16 PM

## 2020-09-06 NOTE — Progress Notes (Signed)
Chaplain Danise Dehne engaged in initial visit with Sonia Side.  Chaplain introduced herself and offered support.  Eric [name] explained the incident that brought him into the hospital and pointed to the various places he hurt himself in his fall.  He also expressed that he was hungry and jokingly discussed his distaste for the hospital food.  He stated his daughter would be bringing him so food.  Chaplain offered presence and listening.  Chaplain available to follow-up as needed.    09/06/20 1100  Clinical Encounter Type  Visited With Patient  Visit Type Initial

## 2020-09-06 NOTE — Progress Notes (Signed)
OT Cancellation Note  Patient Details Name: Eric [name] MRN: [id number] DOB: 05/24/1927   Cancelled Treatment:    Reason Eval/Treat Not Completed: OT screened, no needs identified, will sign off. Patient from [facility] facility and is w/c level requiring assist for ADLs/transfers at baseline.   Delbert Phenix OT OT pager: [phone]  Rosemary Holms 09/06/2020, 12:42 PM

## 2020-09-06 NOTE — Evaluation (Signed)
SLP Cancellation Note  Patient Details Name: Eric [name] MRN: [id number] DOB: [birth year]   Cancelled treatment:       Reason Eval/Treat Not Completed: Other (comment) (apologize for delay in services, RN advised SlP to order, states pt doing well and she would advance diet. Will follow up next date)   Macario Golds 09/06/2020, 3:36 PM

## 2020-09-07 ENCOUNTER — Other Ambulatory Visit: Payer: Self-pay

## 2020-09-07 DIAGNOSIS — E1165 Type 2 diabetes mellitus with hyperglycemia: Secondary | ICD-10-CM | POA: Diagnosis not present

## 2020-09-07 DIAGNOSIS — E1100 Type 2 diabetes mellitus with hyperosmolarity without nonketotic hyperglycemic-hyperosmolar coma (NKHHC): Secondary | ICD-10-CM | POA: Diagnosis not present

## 2020-09-07 LAB — GLUCOSE, CAPILLARY
Glucose-Capillary: 124 mg/dL — ABNORMAL HIGH (ref 70–99)
Glucose-Capillary: 140 mg/dL — ABNORMAL HIGH (ref 70–99)

## 2020-09-07 LAB — BASIC METABOLIC PANEL
Anion gap: 9 (ref 5–15)
BUN: 25 mg/dL — ABNORMAL HIGH (ref 8–23)
CO2: 24 mmol/L (ref 22–32)
Calcium: 8.7 mg/dL — ABNORMAL LOW (ref 8.9–10.3)
Chloride: 110 mmol/L (ref 98–111)
Creatinine, Ser: 1.26 mg/dL — ABNORMAL HIGH (ref 0.61–1.24)
GFR, Estimated: 54 mL/min — ABNORMAL LOW (ref >60)
Glucose, Bld: 120 mg/dL — ABNORMAL HIGH (ref 70–99)
Potassium: 3.4 mmol/L — ABNORMAL LOW (ref 3.5–5.1)
Sodium: 143 mmol/L (ref 135–145)

## 2020-09-07 MED ORDER — INSULIN LISPRO 100 UNIT/ML ~~LOC~~ SOLN: 3.0000 [IU] | Freq: Three times a day (TID) | SUBCUTANEOUS | 11 refills | Status: DC

## 2020-09-07 MED ORDER — POTASSIUM CHLORIDE CRYS ER 20 MEQ PO TBCR
40.0000 meq | EXTENDED_RELEASE_TABLET | Freq: Once | ORAL | Status: AC
  Administered 2020-09-07: 40 meq via ORAL
  Filled 2020-09-07: qty 2

## 2020-09-07 MED ORDER — INSULIN GLARGINE 100 UNIT/ML ~~LOC~~ SOLN
20.0000 [IU] | Freq: Every day | SUBCUTANEOUS | 11 refills | Status: DC
Start: 2020-09-07 — End: 2022-11-11

## 2020-09-07 NOTE — Progress Notes (Signed)
SLP Note  Patient Details Name: Eric [name] MRN: [id number] DOB: 1926/12/07   Cancelled treatment:       Reason Eval/Treat Not Completed: SLP screened, no needs identified, will sign off. Pt being discharged today. RN reports tolerance of all PO and medications. Consulted with MD - will defer BSE at this time. Please reconsult if needs arise.  Kveon Casanas B. Quentin Ore, [facility], [place] Speech Language Pathologist Office: [phone] Pager: [phone]  Shonna Chock 09/07/2020, 11:29 AM

## 2020-09-07 NOTE — Progress Notes (Signed)
Have attempted to call report x2. No response from 2nd floor nurse. Left voicemail with return call number.

## 2020-09-07 NOTE — Discharge Summary (Signed)
Physician Discharge Summary  NATHANAL HERMIZ NAT:[id number] DOB: April 17, 1927 DOA: 09/05/2020  PCP: Jodi Marble, MD  Admit date: 09/05/2020 Discharge date: 09/07/2020  Admitted From: Home Disposition:  Home  Discharge Condition:Stable CODE STATUS: DNR Diet recommendation:Carb Modified  Brief/Interim Summary: Patient is a 86 year old male with history of type diabetes mellitus type 2,dementia, paroxysmal A. fib who was brought to the emergency department from [place] facility for the evaluation of hyperglycemia. He had increased urinary frequency for last few days.On presentation glucose was 757. He was started on insulin drip. Hemoglobin A1c noted to be in the range of 12.  His blood sugars have been stabilized now.  Insulin drip is stopped.  Kidney function has improved with IV fluids.  He will be discharged on Lantus and NovoLog.  He can go back to the nursing facility today.  Following problems were addressed during his hospitalization:   Uncontrolled diabetes mellitus with hyperglycemia:bood glucose of 757 on presentation. Started on insulin drip on admission. Anion gap is normal. Hemoglobin A1c of 12. Needs to be on insulin on discharge. As per the daughter, he was on insulin for years but it was recently stopped at nursing facility. Monitor blood sugars  AKI on CKD stage IIIb: Baseline creatinine about 1.4. AKI on presentation most likely secondary to dehydration from polyuria. Kidney function currently near baseline.   Paroxysmal A. fib: Currently not on anticoagulation. Previously taking Eliquis. Stopped due to history of falls. Not on rate controlling agents either. Currently heart rate is stable and he is in normal sinus rhythm  Dementia/depression: Has history of vascular dementia. Continue supportive care. Continue Prozac, Remeron.  Hypertension: Blood pressure soft.  Continue to monitor.  Not on any medications  COPD: Continue home inhalers  Spiriva. Continue bronchodilators as needed.  History of dysphagia: Tolerating carb consistent diet.  Resume previous diet at nursing facility  Debility/deconditioning: Skilled nursing facility resident.     Discharge Diagnoses:  Active Problems:   Hyperosmolar hyperglycemic state (HHS) [facility]    Discharge Instructions  Discharge Instructions    Diet - low sodium heart healthy   Complete by: As directed    Discharge instructions   Complete by: As directed    1) Continue insulin as instructed 2) Monitor your blood sugars 3) Do a BMP test in a week   Increase activity slowly   Complete by: As directed    No wound care   Complete by: As directed      Allergies as of 09/07/2020      Reactions   Cyanocobalamin [vitamin B12] Other (See Comments)   Leg swelling to gummy B12 vitamin   Vitamin B12 Swelling, Other (See Comments)   A "gummie" version caused swelling of the legs   Ampicillin Rash   Skin rash   Ampicillin Rash      Medication List    STOP taking these medications   diltiazem 30 MG tablet Commonly known as: CARDIZEM   Eliquis 2.5 MG Tabs tablet Generic drug: apixaban   ipratropium 0.02 % nebulizer solution Commonly known as: ATROVENT   Lantus SoloStar 100 UNIT/ML Solostar Pen Generic drug: insulin glargine Replaced by: insulin glargine 100 UNIT/ML injection   levalbuterol 0.63 MG/3ML nebulizer solution Commonly known as: XOPENEX   nitroGLYCERIN 0.4 MG SL tablet Commonly known as: NITROSTAT     TAKE these medications   acetaminophen 325 MG tablet Commonly known as: TYLENOL Take 650 mg by mouth 3 (three) times daily.   Ensure Take 237  mLs by mouth 3 (three) times daily between meals.   FLUoxetine 20 MG capsule Commonly known as: PROZAC Take 20 mg by mouth daily.   insulin glargine 100 UNIT/ML injection Commonly known as: LANTUS Inject 0.2 mLs (20 Units total) into the skin at bedtime. Replaces: Lantus SoloStar 100 UNIT/ML Solostar  Pen   insulin lispro 100 UNIT/ML injection Commonly known as: HumaLOG Inject 0.03 mLs (3 Units total) into the skin 3 (three) times daily before meals. If eats more than 50% of the meals   mirtazapine 7.5 MG tablet Commonly known as: REMERON Take 7.5 mg by mouth at bedtime.   ondansetron 4 MG tablet Commonly known as: ZOFRAN Take 4 mg by mouth every 6 (six) hours as needed for nausea or vomiting.   OXYGEN Give 2 liters via nasal cannula as needed for sats below 90%   sennosides-docusate sodium 8.6-50 MG tablet Commonly known as: SENOKOT-S Take 1 tablet by mouth at bedtime. Hold for loose or liquid stools   simethicone 80 MG chewable tablet Commonly known as: MYLICON Chew 80 mg by mouth 4 (four) times daily -  before meals and at bedtime.   tiotropium 18 MCG inhalation capsule Commonly known as: SPIRIVA Place 18 mcg into inhaler and inhale daily.       Follow-up Information    Jodi Marble, MD. Schedule an appointment as soon as possible for a visit in 1 week(s).   Specialty: Internal Medicine Contact information: [place] [address] [phone]              Allergies  Allergen Reactions  . Cyanocobalamin [Vitamin B12] Other (See Comments)    Leg swelling to gummy B12 vitamin  . Vitamin B12 Swelling and Other (See Comments)    A "gummie" version caused swelling of the legs  . Ampicillin Rash    Skin rash  . Ampicillin Rash    Consultations:  None   Procedures/Studies: CT Head Wo Contrast  Result Date: 08/27/2020 CLINICAL DATA:  Fall EXAM: CT HEAD WITHOUT CONTRAST TECHNIQUE: Contiguous axial images were obtained from the base of the skull through the vertex without intravenous contrast. COMPARISON:  None. FINDINGS: Brain: No evidence of acute territorial infarction, hemorrhage, hydrocephalus,extra-axial collection or mass lesion/mass effect. There is dilatation the ventricles and sulci consistent with age-related atrophy.  Low-attenuation changes in the deep white matter consistent with small vessel ischemia. Vascular: No hyperdense vessel or unexpected calcification. Skull: The skull is intact. No fracture or focal lesion identified. Sinuses/Orbits: The visualized paranasal sinuses and mastoid air cells are clear. The orbits and globes intact. Other: None Cervical spine: Alignment: There is a minimal anterolisthesis C2 on C3 and C3 on C4. Skull base and vertebrae: Visualized skull base is intact. No atlanto-occipital dissociation. The vertebral body heights are well maintained. No fracture or pathologic osseous lesion seen. Soft tissues and spinal canal: The visualized paraspinal soft tissues are unremarkable. No prevertebral soft tissue swelling is seen. The spinal canal is grossly unremarkable, no large epidural collection or significant canal narrowing. Disc levels: Multilevel cervical spine spondylosis seen with anterior osteophytes disc osteophyte complex osteophytes most notable C4-C5 and C5-C6 with severe bilateral neural foraminal narrowing and mild central stenosis. Upper chest: The lung apices are clear. Thoracic inlet is within normal limits. Other: None IMPRESSION: No acute intracranial abnormality. Findings consistent with age related atrophy and chronic small vessel ischemia No acute fracture or malalignment of the spine. Electronically Signed   By: Ebony Cargo.D.  On: 08/27/2020 18:57   CT Cervical Spine Wo Contrast  Result Date: 08/27/2020 CLINICAL DATA:  Fall EXAM: CT HEAD WITHOUT CONTRAST TECHNIQUE: Contiguous axial images were obtained from the base of the skull through the vertex without intravenous contrast. COMPARISON:  None. FINDINGS: Brain: No evidence of acute territorial infarction, hemorrhage, hydrocephalus,extra-axial collection or mass lesion/mass effect. There is dilatation the ventricles and sulci consistent with age-related atrophy. Low-attenuation changes in the deep white matter consistent  with small vessel ischemia. Vascular: No hyperdense vessel or unexpected calcification. Skull: The skull is intact. No fracture or focal lesion identified. Sinuses/Orbits: The visualized paranasal sinuses and mastoid air cells are clear. The orbits and globes intact. Other: None Cervical spine: Alignment: There is a minimal anterolisthesis C2 on C3 and C3 on C4. Skull base and vertebrae: Visualized skull base is intact. No atlanto-occipital dissociation. The vertebral body heights are well maintained. No fracture or pathologic osseous lesion seen. Soft tissues and spinal canal: The visualized paraspinal soft tissues are unremarkable. No prevertebral soft tissue swelling is seen. The spinal canal is grossly unremarkable, no large epidural collection or significant canal narrowing. Disc levels: Multilevel cervical spine spondylosis seen with anterior osteophytes disc osteophyte complex osteophytes most notable C4-C5 and C5-C6 with severe bilateral neural foraminal narrowing and mild central stenosis. Upper chest: The lung apices are clear. Thoracic inlet is within normal limits. Other: None IMPRESSION: No acute intracranial abnormality. Findings consistent with age related atrophy and chronic small vessel ischemia No acute fracture or malalignment of the spine. Electronically Signed   By: Prudencio Pair M.D.   On: 08/27/2020 18:57   DG Chest Portable 1 View  Result Date: 08/27/2020 CLINICAL DATA:  Fall EXAM: PORTABLE CHEST 1 VIEW COMPARISON:  Radiograph 01/28/2020, CT 10/25/2017 FINDINGS: Low lung volumes and atelectasis some background some more chronically coarsened interstitial and bronchitic features. There is asymmetric opacity in the right lung apex which is similar to prior and likely reflects a combination of overlapping sternoclavicular degenerative change, first costal cartilage, and tortuous brachiocephalic vasculature when compared to prior cross-sectional imaging. No new opacity is seen. 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 Calcified and tortuous aorta. No acute cardiomediastinal contour abnormality is seen. No pneumothorax. No effusion. No acute osseous or soft tissue abnormality. IMPRESSION: 1. No acute cardiopulmonary abnormality. 2. Asymmetric opacity in the right lung apex is similar to prior and likely reflects a combination of overlapping sternoclavicular degenerative change, first costal cartilage, and tortuous brachiocephalic vasculature when compared to prior cross-sectional imaging. Electronically Signed   By: Lovena Le M.D.   On: 08/27/2020 19:10   DG Hips Bilat W or Wo Pelvis 3-4 Views  Result Date: 08/27/2020 CLINICAL DATA:  Fall.  Hip pain. EXAM: DG HIP (WITH OR WITHOUT PELVIS) 3-4V BILAT COMPARISON:  No comparison studies available. FINDINGS: Bones are diffusely demineralized. No evidence for right femoral neck fracture. Patient is status post left total hip replacement. Linear lucency in the greater trochanter of the proximal left femur raises concern for nondisplaced greater trochanter fracture. Femoral component appears located on the cross-table lateral film. IMPRESSION: Linear lucency in the left greater trochanter adjacent to the femoral prosthesis raises concern for nondisplaced fracture. CT may prove helpful to confirm. Electronically Signed   By: Misty Stanley M.D.   On: 08/27/2020 19:08       Subjective: Patient seen and examined at the bedside this mrng.  Hemodynamically stable for discharge today  Discharge Exam: Vitals:   09/06/20 2348  09/07/20 0300  BP: 90/66 (!) 107/52  Pulse: 77 71  Resp: 16 16  Temp: 97.7 F (36.5 C) 97.6 F (36.4 C)  SpO2:  93%   Vitals:   09/06/20 1507 09/06/20 1929 09/06/20 2348 09/07/20 0300  BP: 115/61 (!) 108/59 90/66 (!) 107/52  Pulse: 80 73 77 71  Resp: 18 15 16 16   Temp: 98.2 F (36.8 C) 98 F (36.7 C) 97.7 F (36.5 C) 97.6 F (36.4 C)  TempSrc: Oral Oral Oral Oral   SpO2: 100% 97% 99% 93%  Weight: 66.8 kg     Height:        General: Pt is alert, awake, not in acute distress Cardiovascular: RRR, S1/S2 +, no rubs, no gallops Respiratory: CTA bilaterally, no wheezing, no rhonchi Abdominal: Soft, NT, ND, bowel sounds + Extremities: no edema, no cyanosis    The results of significant diagnostics from this hospitalization (including imaging, microbiology, ancillary and laboratory) are listed below for reference.     Microbiology: Recent Results (from the past 240 hour(s))  Respiratory Panel by RT PCR (Flu A&B, Covid) - Nasopharyngeal Swab     Status: None   Collection Time: 09/05/20 10:44 AM   Specimen: Nasopharyngeal Swab  Result Value Ref Range Status   SARS Coronavirus 2 by RT PCR NEGATIVE NEGATIVE Final    Comment: (NOTE) SARS-CoV-2 target nucleic acids are NOT DETECTED.  The SARS-CoV-2 RNA is generally detectable in upper respiratoy specimens during the acute phase of infection. The lowest concentration of SARS-CoV-2 viral copies this assay can detect is 131 copies/mL. A negative result does not preclude SARS-Cov-2 infection and should not be used as the sole basis for treatment or other patient management decisions. A negative result may occur with  improper specimen collection/handling, submission of specimen other than nasopharyngeal swab, presence of viral mutation(s) within the areas targeted by this assay, and inadequate number of viral copies (<131 copies/mL). A negative result must be combined with clinical observations, patient history, and epidemiological information. The expected result is Negative.  Fact Sheet for Patients:  PinkCheek.be  Fact Sheet for Healthcare Providers:  GravelBags.it  This test is no t yet approved or cleared by the [place] FDA and  has been authorized for detection and/or diagnosis of SARS-CoV-2 by FDA under an Emergency Use  Authorization (EUA). This EUA will remain  in effect (meaning this test can be used) for the duration of the COVID-19 declaration under Section 564(b)(1) of the Act, 21 U.S.C. section 360bbb-3(b)(1), unless the authorization is terminated or revoked sooner.     Influenza A by PCR NEGATIVE NEGATIVE Final   Influenza B by PCR NEGATIVE NEGATIVE Final    Comment: (NOTE) The Xpert Xpress SARS-CoV-2/FLU/RSV assay is intended as an aid in  the diagnosis of influenza from Nasopharyngeal swab specimens and  should not be used as a sole basis for treatment. Nasal washings and  aspirates are unacceptable for Xpert Xpress SARS-CoV-2/FLU/RSV  testing.  Fact Sheet for Patients: PinkCheek.be  Fact Sheet for Healthcare Providers: GravelBags.it  This test is not yet approved or cleared by the [place] FDA and  has been authorized for detection and/or diagnosis of SARS-CoV-2 by  FDA under an Emergency Use Authorization (EUA). This EUA will remain  in effect (meaning this test can be used) for the duration of the  Covid-19 declaration under Section 564(b)(1) of the Act, 21  U.S.C. section 360bbb-3(b)(1), unless the authorization is  terminated or revoked. Performed at [facility], [address] [place], [place] [address]   MRSA PCR Screening     Status: None   Collection Time: 09/05/20  9:36 PM   Specimen: Nasal Mucosa; Nasopharyngeal  Result Value Ref Range Status   MRSA by PCR NEGATIVE NEGATIVE Final    Comment:        The GeneXpert MRSA Assay (FDA approved for NASAL specimens only), is one component of a comprehensive MRSA colonization surveillance program. It is not intended to diagnose MRSA infection nor to guide or monitor treatment for MRSA infections. Performed at [facility], [place] [address] [place],  [address]      Labs: BNP (last 3 results) Recent Labs     01/28/20 0705  BNP 194.1*   Basic Metabolic Panel: Recent Labs  Lab 09/05/20 1030 09/05/20 2256 09/06/20 0800 09/07/20 0737  NA 135 146* 145 143  K 4.6 3.4* 3.9 3.4*  CL 103 113* 110 110  CO2 23 21* 24 24  GLUCOSE 757* 227* 212* 120*  BUN 66* 49* 39* 25*  CREATININE 2.41* 1.64* 1.48* 1.26*  CALCIUM 8.7* 8.8* 8.8* 8.7*   Liver Function Tests: Recent Labs  Lab 09/05/20 1030  AST 13*  ALT 11  ALKPHOS 80  BILITOT 1.0  PROT 6.7  ALBUMIN 3.4*   No results for input(s): LIPASE, AMYLASE in the last 168 hours. No results for input(s): AMMONIA in the last 168 hours. CBC: Recent Labs  Lab 09/05/20 1030 09/06/20 0800  WBC 7.7 9.0  NEUTROABS 6.4  --   HGB 11.6* 12.1*  HCT 35.6* 38.2*  MCV 95.4 99.0  PLT 191 145*   Cardiac Enzymes: No results for input(s): CKTOTAL, CKMB, CKMBINDEX, TROPONINI in the last 168 hours. BNP: Invalid input(s): POCBNP CBG: Recent Labs  Lab 09/06/20 1249 09/06/20 1658 09/06/20 2209 09/06/20 2228 09/07/20 0712  GLUCAP 166* 431* 268* 272* 124*   D-Dimer No results for input(s): DDIMER in the last 72 hours. Hgb A1c Recent Labs    09/06/20 0221  HGBA1C 12.1*   Lipid Profile No results for input(s): CHOL, HDL, LDLCALC, TRIG, CHOLHDL, LDLDIRECT in the last 72 hours. Thyroid function studies No results for input(s): TSH, T4TOTAL, T3FREE, THYROIDAB in the last 72 hours.  Invalid input(s): FREET3 Anemia work up No results for input(s): VITAMINB12, FOLATE, FERRITIN, TIBC, IRON, RETICCTPCT in the last 72 hours. Urinalysis    Component Value Date/Time   COLORURINE YELLOW 09/05/2020 1204   APPEARANCEUR CLEAR 09/05/2020 1204   LABSPEC 1.026 09/05/2020 1204   PHURINE 5.0 09/05/2020 1204   GLUCOSEU >=500 (A) 09/05/2020 1204   HGBUR NEGATIVE 09/05/2020 1204   BILIRUBINUR NEGATIVE 09/05/2020 1204   KETONESUR 5 (A) 09/05/2020 1204   PROTEINUR NEGATIVE 09/05/2020 1204   UROBILINOGEN 0.2 07/01/2014 0332   NITRITE NEGATIVE 09/05/2020 1204    LEUKOCYTESUR NEGATIVE 09/05/2020 1204   Sepsis Labs Invalid input(s): PROCALCITONIN,  WBC,  LACTICIDVEN Microbiology Recent Results (from the past 240 hour(s))  Respiratory Panel by RT PCR (Flu A&B, Covid) - Nasopharyngeal Swab     Status: None   Collection Time: 09/05/20 10:44 AM   Specimen: Nasopharyngeal Swab  Result Value Ref Range Status   SARS Coronavirus 2 by RT PCR NEGATIVE NEGATIVE Final    Comment: (NOTE) SARS-CoV-2 target nucleic acids are NOT DETECTED.  The SARS-CoV-2 RNA is generally detectable in upper respiratoy specimens during the acute phase of infection. The lowest concentration of SARS-CoV-2 viral copies this assay can detect is 131 copies/mL. A negative result does not  preclude SARS-Cov-2 infection and should not be used as the sole basis for treatment or other patient management decisions. A negative result may occur with  improper specimen collection/handling, submission of specimen other than nasopharyngeal swab, presence of viral mutation(s) within the areas targeted by this assay, and inadequate number of viral copies (<131 copies/mL). A negative result must be combined with clinical observations, patient history, and epidemiological information. The expected result is Negative.  Fact Sheet for Patients:  PinkCheek.be  Fact Sheet for Healthcare Providers:  GravelBags.it  This test is no t yet approved or cleared by the [place] FDA and  has been authorized for detection and/or diagnosis of SARS-CoV-2 by FDA under an Emergency Use Authorization (EUA). This EUA will remain  in effect (meaning this test can be used) for the duration of the COVID-19 declaration under Section 564(b)(1) of the Act, 21 U.S.C. section 360bbb-3(b)(1), unless the authorization is terminated or revoked sooner.     Influenza A by PCR NEGATIVE NEGATIVE Final   Influenza B by PCR NEGATIVE NEGATIVE Final     Comment: (NOTE) The Xpert Xpress SARS-CoV-2/FLU/RSV assay is intended as an aid in  the diagnosis of influenza from Nasopharyngeal swab specimens and  should not be used as a sole basis for treatment. Nasal washings and  aspirates are unacceptable for Xpert Xpress SARS-CoV-2/FLU/RSV  testing.  Fact Sheet for Patients: PinkCheek.be  Fact Sheet for Healthcare Providers: GravelBags.it  This test is not yet approved or cleared by the [place] FDA and  has been authorized for detection and/or diagnosis of SARS-CoV-2 by  FDA under an Emergency Use Authorization (EUA). This EUA will remain  in effect (meaning this test can be used) for the duration of the  Covid-19 declaration under Section 564(b)(1) of the Act, 21  U.S.C. section 360bbb-3(b)(1), unless the authorization is  terminated or revoked. Performed at [facility], [place] [address], [place], [place] [address]   MRSA PCR Screening     Status: None   Collection Time: 09/05/20  9:36 PM   Specimen: Nasal Mucosa; Nasopharyngeal  Result Value Ref Range Status   MRSA by PCR NEGATIVE NEGATIVE Final    Comment:        The GeneXpert MRSA Assay (FDA approved for NASAL specimens only), is one component of a comprehensive MRSA colonization surveillance program. It is not intended to diagnose MRSA infection nor to guide or monitor treatment for MRSA infections. Performed at [facility], [place] [address], [place], [place] [address]     Please note: You were cared for by a hospitalist during your hospital stay. Once you are discharged, your primary care physician will handle any further medical issues. Please note that NO REFILLS for any discharge medications will be authorized once you are discharged, as it is imperative that you return to your primary care physician (or establish a relationship with a primary care physician if you do not  have one) for your post hospital discharge needs so that they can reassess your need for medications and monitor your lab values.    Time coordinating discharge: 40 minutes  SIGNED:   Shelly Coss, MD  [facility] 09/07/2020, 10:57 AM Pager [phone]  If 7PM-7AM, please contact night-coverage [email or web address] Password [email or web address]

## 2020-09-07 NOTE — Care Management Important Message (Signed)
Important Message  Patient Details IM Letter given to the Patient. Name: [name] MRN: [id number] Date of Birth: 05/11/1927   Medicare Important Message Given:  Yes     Kerin Salen 09/07/2020, 11:12 AM

## 2020-09-07 NOTE — TOC Transition Note (Signed)
Transition of Care [facility]) - CM/SW Discharge Note   Patient Details  Name: Eric [name] MRN: [id number] Date of Birth: 07-23-1927  [facility] CM/SW Contact:  Lynnell Catalan, RN Phone Number: 09/07/2020, 12:00 PM   Clinical Narrative:    Pt to dc back to [facility] today. Left message on daughter's voicemail to inform her of DC. Yellow DNR on chart for transport. Liaison for [facility] contacted and confirmed that pt can come back today. [facility] called for transport. RN to call report to [phone].     Barriers to Discharge: No Barriers Identified   Patient Goals and CMS Choice Patient states their goals for this hospitalization and ongoing recovery are:: I want to go back to the snf CMS Medicare.gov Compare Post Acute Care list provided to:: Patient   Discharge Plan and Services   Discharge Planning Services: CM Consult Post Acute Care Choice: [place]               Readmission Risk Interventions No flowsheet data found.

## 2022-08-27 ENCOUNTER — Non-Acute Institutional Stay: Payer: Medicare Other | Admitting: Internal Medicine

## 2022-08-27 DIAGNOSIS — E43 Unspecified severe protein-calorie malnutrition: Secondary | ICD-10-CM

## 2022-08-27 DIAGNOSIS — I69391 Dysphagia following cerebral infarction: Secondary | ICD-10-CM

## 2022-08-27 DIAGNOSIS — Z9181 History of falling: Secondary | ICD-10-CM

## 2022-08-27 DIAGNOSIS — Z515 Encounter for palliative care: Secondary | ICD-10-CM

## 2022-08-27 DIAGNOSIS — N1832 Chronic kidney disease, stage 3b: Secondary | ICD-10-CM

## 2022-08-27 DIAGNOSIS — F01518 Vascular dementia, unspecified severity, with other behavioral disturbance: Secondary | ICD-10-CM

## 2022-08-27 DIAGNOSIS — M25551 Pain in right hip: Secondary | ICD-10-CM

## 2022-08-27 NOTE — Progress Notes (Signed)
[facility] Consult Note Telephone: [phone]  Fax: [phone]   Date of encounter: 08/27/22 8:59 AM PATIENT NAME: Eric [name]  [place] [address]   [phone] (home)  DOB: 1927/06/12 MRN: [id number] PRIMARY CARE PROVIDER:    Jodi Marble, MD,  [place] [address] [phone]  REFERRING PROVIDER:   Jodi Marble, MD [address] [place],  [place] [address] [phone]  RESPONSIBLE PARTY:    Contact Information     Name Relation Home Work Mobile   [name] Daughter   [phone]   Samar, Dass [phone]  [phone]   Claris, Guymon   [phone]        I met face to face with patient and family in [facility] skilled nursing facility. Palliative Care was asked to follow this patient by consultation request of  Jodi Marble, MD to address advance care planning and complex medical decision making. This is the initial visit.                                     ASSESSMENT AND PLAN / RECOMMENDATIONS:   Advance Care Planning/Goals of Care: Goals include to maximize quality of life and symptom management. Patient/health care surrogate gave his/her permission to discuss.Our advance care planning conversation included a discussion about:    The value and importance of advance care planning  Experiences with loved ones who have been seriously ill or have died  Exploration of personal, cultural or spiritual beliefs that might influence medical decisions  Exploration of goals of care in the event of a sudden injury or illness  Identification  of a healthcare agent --[name] (Daughter) [phone] [facility])  Review and updating or creation of an  advance directive document . Decision not to resuscitate or to de-escalate disease focused treatments due to poor prognosis. CODE STATUS:  DNR  Symptom Management/Plan: 1.  Vascular dementia with behavior disturbance ([place]) -pt now unable to transfer from bed--watched CNA attempt this and he declined due to pain in hip -also lacks postural stability to sit up on his own--too weak so bedbound -losing weight, has dysphagia, has had fall "a while ago" that led to hip pain, but no fracture found per staff -appears hospice eligible  2. Stage 3b chronic kidney disease ([name] -intake poor and requires feeding so suspect renal function worsening with minimal intake -is making urine as his bed was soaked  3. Dysphagia as late effect of stroke -eating less than 1/2 of meals and seems to have a lot of weight loss (see where he weighed 147 lbs and now is extremely cachectic with ribs showing), is edentulous with buccal wasting -again recommend hospice  4. History of fall -sounds like several weeks ago and still has hip pain though not fractured  5. Right hip pain -continue scheduled tylenol for pain, may need tramadol or norco for pain   6. Severe protein-calorie malnutrition ([place]) -requires feeding at meals, monitor for hypoglycemia in view of insulin  7. Palliative care encounter -recommend [facility] hospice evaluation  Follow up Palliative Care Visit: Palliative care will continue to follow for complex medical decision making, advance care planning, and clarification of goals.   This visit was coded based on medical decision making (MDM).  PPS: 30%  HOSPICE ELIGIBILITY/DIAGNOSIS: yes/think abnormal weight loss  Chief Complaint: Initial  palliative consult at [facility]LLNESS:  Eric [name] is a 88 y.o. year old male  with vascular dementia with dysphagia and depression, DMII previously uncontrolled with hba1c of 12 in 2021, CKD3b, paroxysmal afib seen for initial palliative consult at [facility].   CNA reports he has been eating less than 1/2 of meals when fed.  He is edentulous.  He is not able to feed himself at this point.  He  requires full assist with adls.  He is weak and cannot sit up on his own today.  He cannot transfer.  He's incontinent of bowel and bladder.  He's clearly lost weight from 2021 weight in chart as he is now cachectic with ribs exposed.    She also reports he had a fall "a while ago" where he hurt his right hip and he's not been as mobile since.  Reportedly imaging for fx was negative.    He was started on remeron for appetite previously.    History obtained from review of EMR, discussion with primary team, and interview with family, facility staff/caregiver and/or Mr. Staley.   I reviewed available labs, medications, imaging, studies and related documents from the EMR.  Records reviewed and summarized above.   ROS Review of Systemssee hpi  Physical Exam:  Wt Readings from Last 500 Encounters:  09/06/20 147 lb 4.3 oz (66.8 kg)  08/27/20 141 lb 1.5 oz (64 kg)  05/17/20 139 lb (63 kg)  01/29/20 153 lb (69.4 kg)  08/18/18 147 lb 6.4 oz (66.9 kg)  07/15/18 149 lb 3.2 oz (67.7 kg)  06/30/18 149 lb 3.2 oz (67.7 kg)  06/18/18 146 lb 6.4 oz (66.4 kg)  06/10/18 144 lb 9.6 oz (65.6 kg)  05/17/18 147 lb 14.4 oz (67.1 kg)  04/16/18 149 lb 4.8 oz (67.7 kg)  03/18/18 149 lb (67.6 kg)  03/17/18 149 lb (67.6 kg)  03/12/18 151 lb 1.6 oz (68.5 kg)  02/16/18 152 lb 4.8 oz (69.1 kg)  02/10/18 152 lb 4.8 oz (69.1 kg)  02/03/18 152 lb 4.8 oz (69.1 kg)  01/19/18 149 lb 6.4 oz (67.8 kg)  01/05/18 166 lb 6.4 oz (75.5 kg)  01/01/18 166 lb 6.4 oz (75.5 kg)  12/28/17 166 lb 6.4 oz (75.5 kg)  12/11/17 164 lb (74.4 kg)  12/03/17 169 lb (76.7 kg)  12/01/17 177 lb 4.8 oz (80.4 kg)  11/26/17 179 lb (81.2 kg)  11/24/17 179 lb (81.2 kg)  11/19/17 177 lb 4.8 oz (80.4 kg)  11/12/17 177 lb 4.8 oz (80.4 kg)  11/11/17 177 lb 4.8 oz (80.4 kg)  11/05/17 180 lb (81.6 kg)  11/02/17 180 lb (81.6 kg)  10/30/17 192 lb 14.4 oz (87.5 kg)  08/31/17 230 lb (104.3 kg)  05/21/17 220 lb (99.8 kg)  04/13/17 214 lb (97.1  kg)  02/19/17 204 lb 12.8 oz (92.9 kg)  02/12/17 206 lb 12 oz (93.8 kg)  01/12/17 207 lb 3.2 oz (94 kg)  12/07/16 198 lb (89.8 kg)  08/12/16 197 lb 8 oz (89.6 kg)  06/26/16 196 lb (88.9 kg)  05/09/16 194 lb 4 oz (88.1 kg)  04/30/16 193 lb (87.5 kg)  03/25/16 190 lb 12.8 oz (86.5 kg)  03/24/16 190 lb 12.8 oz (86.5 kg)  02/28/16 188 lb (85.3 kg)  02/26/16 187 lb (84.8 kg)  02/19/16 182 lb (82.6 kg)  02/15/16 179 lb 6.4 oz (81.4 kg)  02/13/16 213 lb 3.2 oz (96.7 kg)  02/05/16 185 lb (83.9  kg)  01/22/16 193 lb (87.5 kg)  01/17/16 193 lb (87.5 kg)  04/24/15 216 lb (98 kg)  03/29/15 214 lb 8 oz (97.3 kg)  03/05/15 216 lb 8 oz (98.2 kg)  07/11/14 209 lb (94.8 kg)  07/07/14 209 lb (94.8 kg)  07/03/14 239 lb 13.8 oz (108.8 kg)  02/14/14 205 lb (93 kg)  11/03/13 211 lb 8 oz (95.9 kg)  05/03/13 209 lb 8 oz (95 kg)  01/11/13 206 lb (93.4 kg)  07/26/12 206 lb (93.4 kg)  03/08/12 213 lb 8 oz (96.8 kg)  12/01/11 214 lb 8 oz (97.3 kg)  09/26/11 210 lb (95.3 kg)  12/05/10 211 lb (95.7 kg)  10/31/10 206 lb 4 oz (93.6 kg)  01/08/09 203 lb (92.1 kg)   Physical Exam Constitutional:      Appearance: He is ill-appearing.     Comments: Frail and cachectic  HENT:     Head: Normocephalic and atraumatic.     Mouth/Throat:     Comments: edentulous Eyes:     Extraocular Movements: Extraocular movements intact.     Pupils: Pupils are equal, round, and reactive to light.  Cardiovascular:     Rate and Rhythm: Rhythm irregular.     Heart sounds: Murmur heard.  Pulmonary:     Effort: Pulmonary effort is normal.  Abdominal:     General: Bowel sounds are normal.     Tenderness: There is no abdominal tenderness.  Genitourinary:    Comments: Has had urinary incontinence previously--brief dry but bed wet Musculoskeletal:     Right lower leg: No edema.     Left lower leg: No edema.     Comments: Appears to be developing contracture, tender over right lateral hip, no bruising seen, has visible  ribs, deformity of toes   Skin:    General: Skin is warm and dry.     Coloration: Skin is pale.  Neurological:     Mental Status: He is alert.     Motor: Weakness present.     Comments: Not able to stand and pivot, lacks postural stability even to stay sitting up today, dysarthric speech, oriented to self and some familiar staff  Psychiatric:        Mood and Affect: Mood normal.     CURRENT PROBLEM LIST:  Patient Active Problem List   Diagnosis Date Noted   Hyperosmolar hyperglycemic state (HHS) ([place]) 09/05/2020   Abnormal CT of the abdomen 05/19/2020   Dilated bile duct 05/19/2020   Abnormal MRI, liver 05/19/2020   Pressure injury of skin 01/30/2020   Choledocholithiasis    Fall    Hypoglycemia    Atrial fibrillation ([place])    Abnormal liver diagnostic imaging    Severe protein-calorie malnutrition ([place]) 01/06/2018   Fall at nursing home 01/05/2018   Arthritis of left wrist 12/28/2017   Hypokalemia 11/26/2017   Weight loss, non-intentional 11/24/2017   Acute pain of right shoulder 11/24/2017   CAD (coronary artery disease), native coronary artery 11/02/2017   Vascular dementia with behavior disturbance ([place]) 11/02/2017   Chronic constipation 11/02/2017   Chronic diastolic CHF (congestive heart failure) ([place]) 10/28/2017   Tachycardia 10/25/2017   Major depressive disorder, single episode, severe without psychotic features ([place]) 10/09/2017   Stroke ([place])    History of aortic stenosis    Foraminal stenosis of cervical region    Essential hypertension, benign    Colon polyp    Cervicalgia    Cerebrovascular disease, unspecified    Vitamin B12  deficiency 06/02/2016   Anemia, iron deficiency 02/28/2016   Dyslipidemia associated with type 2 diabetes mellitus ([place])    Sepsis ([place])    S/P CABG (coronary artery bypass graft) 12/25/2015   S/P AVR (aortic valve replacement) 12/25/2015   Atrial fibrillation with RVR ([place]) 12/25/2015   COPD (chronic obstructive pulmonary  disease) ([place]) 10/21/2015   GERD (gastroesophageal reflux disease) 07/08/2014   Status post total replacement of left hip 06/30/2014   Medicare annual wellness visit, subsequent 03/08/2012   CKD (chronic kidney disease) stage 3, GFR 30-59 ml/min ([name] 09/24/2010   BPH (benign prostatic hypertrophy) with urinary obstruction 01/10/2010   Bilateral hydrocele 12/18/2009   History of CVA (cerebrovascular accident) 02/28/2009   Insulin dependent diabetes mellitus with complications ([place]) 67/89/3810   Diabetic neuropathy associated with type 2 diabetes mellitus ([place]) 12/17/2006   PAST MEDICAL HISTORY:  Active Ambulatory Problems    Diagnosis Date Noted   Insulin dependent diabetes mellitus with complications ([place]) 17/51/0258   Diabetic neuropathy associated with type 2 diabetes mellitus ([place]) 12/17/2006   History of CVA (cerebrovascular accident) 02/28/2009   CKD (chronic kidney disease) stage 3, GFR 30-59 ml/min ([name] 09/24/2010   BPH (benign prostatic hypertrophy) with urinary obstruction 01/10/2010   Medicare annual wellness visit, subsequent 03/08/2012   Status post total replacement of left hip 06/30/2014   GERD (gastroesophageal reflux disease) 07/08/2014   S/P CABG (coronary artery bypass graft) 12/25/2015   S/P AVR (aortic valve replacement) 12/25/2015   Atrial fibrillation with RVR ([place]) 12/25/2015   Sepsis ([place])    Dyslipidemia associated with type 2 diabetes mellitus ([place])    Anemia, iron deficiency 02/28/2016   Vitamin B12 deficiency 06/02/2016   COPD (chronic obstructive pulmonary disease) ([place]) 10/21/2015   Stroke ([place])    History of aortic stenosis    Foraminal stenosis of cervical region    Essential hypertension, benign    Colon polyp    Cervicalgia    Cerebrovascular disease, unspecified    Bilateral hydrocele 12/18/2009   Major depressive disorder, single episode, severe without psychotic features ([place]) 10/09/2017   Tachycardia 10/25/2017   Chronic diastolic CHF  (congestive heart failure) ([place]) 10/28/2017   CAD (coronary artery disease), native coronary artery 11/02/2017   Vascular dementia with behavior disturbance ([place]) 11/02/2017   Chronic constipation 11/02/2017   Weight loss, non-intentional 11/24/2017   Acute pain of right shoulder 11/24/2017   Hypokalemia 11/26/2017   Arthritis of left wrist 12/28/2017   Fall at nursing home 01/05/2018   Severe protein-calorie malnutrition ([place]) 01/06/2018   Fall    Hypoglycemia    Atrial fibrillation ([place])    Abnormal liver diagnostic imaging    Choledocholithiasis    Pressure injury of skin 01/30/2020   Abnormal CT of the abdomen 05/19/2020   Dilated bile duct 05/19/2020   Abnormal MRI, liver 05/19/2020   Hyperosmolar hyperglycemic state (HHS) ([place]) 09/05/2020   Resolved Ambulatory Problems    Diagnosis Date Noted   HYPERCHOLESTEROLEMIA 12/17/2006   CERUMEN IMPACTION 09/24/2010   HYPERTENSION, BENIGN SYSTEMIC 12/17/2006   Skin lesion 12/01/2011   Cerumen impaction 03/08/2012   Shingles 07/27/2012   Left hip pain 11/03/2013   Elbow laceration 11/03/2013   Falls 11/03/2013   Delirium, drug-induced 07/02/2014   Paroxysmal atrial fibrillation ([place]) 07/02/2014   Advanced care planning/counseling discussion 03/29/2015   Bronchitis 12/25/2015   Acute respiratory distress 12/25/2015   AKI (acute kidney injury) ([place]) 12/25/2015   Dehydration    COPD exacerbation ([name]    Metabolic acidosis  Dilated cardiomyopathy ([place])    Pulmonary hypertension ([name]    H/O aortic valve replacement    Essential hypertension    Uncontrolled type 2 diabetes mellitus with complication    Acute on chronic renal failure ([name]    Hyponatremia    Hypotension    Delirium    Acute renal failure (ARF) ([place])    Pressure ulcer 01/07/2016   Slurred speech    Bacteremia    Acute endocarditis    Carotid stenosis 02/28/2016   Lesion of right external ear 09/32/3557   Acute diastolic (congestive) heart failure ([place])  04/13/2017   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   Shingles 07/27/2012   Esophageal reflux    Diabetes mellitus without complication ([place])    Acute endocarditis    Pneumonia 10/25/2017   Anemia 10/25/2017   HCAP (healthcare-associated pneumonia) 10/25/2017   Acute kidney injury superimposed on CKD ([place]) 10/28/2017   Dementia ([place]) 32/20/2542   Acute metabolic encephalopathy 70/62/3762   Sepsis ([place]) 01/28/2020   Shock ([place])    Past Medical History:  Diagnosis Date   CHF (congestive heart failure) ([name]    Coronary atherosclerosis of unspecified type of vessel, native or graft    Hyperlipemia    Hypertension    Iron deficiency    Major depressive disorder    Type II or unspecified type diabetes mellitus with neurological manifestations, not stated as uncontrolled(250.60)    SOCIAL HX:  Social History   Tobacco Use   Smoking status: Never   Smokeless tobacco: Never  Substance Use Topics   Alcohol use: Not Currently    Comment: occasional   FAMILY HX:  Family History  Problem Relation Age of Onset   Heart attack Father 74   Breast cancer Mother 12   Brain cancer Sister    Prostate cancer Brother    Diabetes Neg Hx    Stroke Neg Hx    Colon cancer Neg Hx    Liver disease Neg Hx    Pancreatic cancer Neg Hx    Rectal cancer Neg Hx    Stomach cancer Neg Hx       ALLERGIES:  Allergies  Allergen Reactions   Cyanocobalamin [Vitamin B12] Other (See Comments)    Leg swelling to gummy B12 vitamin   Vitamin B12 Swelling and Other (See Comments)    A "gummie" version caused swelling of the legs   Ampicillin Rash    Skin rash   Ampicillin Rash      PERTINENT MEDICATIONS:  Outpatient Encounter Medications as of 08/27/2022  Medication Sig   acetaminophen (TYLENOL) 325 MG tablet Take 650 mg by mouth 3 (three) times daily.   Ensure (ENSURE) Take 237 mLs by mouth 3 (three) times daily between meals.   FLUoxetine (PROZAC) 20 MG capsule Take 20 mg by mouth  daily.   insulin glargine (LANTUS) 100 UNIT/ML injection Inject 0.2 mLs (20 Units total) into the skin at bedtime.   insulin lispro (HUMALOG) 100 UNIT/ML injection Inject 0.03 mLs (3 Units total) into the skin 3 (three) times daily before meals. If eats more than 50% of the meals   mirtazapine (REMERON) 7.5 MG tablet Take 7.5 mg by mouth at bedtime.    ondansetron (ZOFRAN) 4 MG tablet Take 4 mg by mouth every 6 (six) hours as needed for nausea or vomiting.   OXYGEN Give 2 liters via nasal cannula as needed for sats below 90%   sennosides-docusate sodium (SENOKOT-S) 8.6-50 MG tablet Take 1 tablet by  mouth at bedtime. Hold for loose or liquid stools   simethicone (MYLICON) 80 MG chewable tablet Chew 80 mg by mouth 4 (four) times daily -  before meals and at bedtime.   tiotropium (SPIRIVA) 18 MCG inhalation capsule Place 18 mcg into inhaler and inhale daily.   No facility-administered encounter medications on file as of 08/27/2022.    Thank you for the opportunity to participate in the care of Mr. Keesling.  The palliative care team will continue to follow. Please call our office at [phone] if we can be of additional assistance.   Hollace Kinnier, DO  COVID-19 PATIENT SCREENING TOOL Asked and negative response unless otherwise noted:  Have you had symptoms of covid, tested positive or been in contact with someone with symptoms/positive test in the past 5-10 days?  NO

## 2022-08-31 ENCOUNTER — Encounter: Payer: Self-pay | Admitting: Internal Medicine

## 2022-11-11 ENCOUNTER — Non-Acute Institutional Stay: Payer: Medicare Other | Admitting: Family Medicine

## 2022-11-11 ENCOUNTER — Telehealth: Payer: Self-pay | Admitting: Family Medicine

## 2022-11-11 ENCOUNTER — Encounter: Payer: Self-pay | Admitting: Family Medicine

## 2022-11-11 VITALS — BP 104/62 | HR 79 | Temp 98.0°F | Resp 16 | Wt 122.0 lb

## 2022-11-11 DIAGNOSIS — Z515 Encounter for palliative care: Secondary | ICD-10-CM | POA: Insufficient documentation

## 2022-11-11 DIAGNOSIS — F01518 Vascular dementia, unspecified severity, with other behavioral disturbance: Secondary | ICD-10-CM

## 2022-11-11 NOTE — Progress Notes (Signed)
Eric [name] Consult Note Telephone: [phone]  Fax: [phone]    Date of encounter: 11/11/22 10:53 AM PATIENT NAME: Eric [name]  [place] [address]   [phone] (home)  DOB: 01/26/1927 MRN: [id number] PRIMARY CARE PROVIDER:    Jodi Marble, MD,  [place] [address] [phone]  REFERRING PROVIDER:   Jodi Marble, MD [address] [place],  [place] [address] [phone]  RESPONSIBLE PARTY:    Contact Information     Name Relation Home Work Mobile   Eric [name] Daughter   [phone]   Eric [name], Eric [name] [phone]  [phone]   Eric [name], Eric [name]   [phone]        I met face to face with patient in [facility] facility. Palliative Care was asked to follow this patient by consultation request of  Eric Marble, MD to address advance care planning and complex medical decision making. This is a follow up visit   ASSESSMENT , SYMPTOM MANAGEMENT AND PLAN / RECOMMENDATIONS:   Vascular dementia FAST 7 score 6e Advised pt's daughter on hygiene neglect common in more advanced stages of dementia. Sent electronic booklet per her agreement for [facility] Dementia Care Patient and Caregiver Guide.   Palliative Care Encounter Discussed difference between DNR and MOST and when they are applied. Sent sample of [facility] MOST form  Advance Care Planning/Goals of Care: Goals include to maximize quality of life and symptom management. Health care surrogate gave her permission to discuss. Our advance care planning conversation included a discussion about:    Exploration of personal, cultural or spiritual beliefs that might influence medical decisions  Exploration of goals of care in the event of a sudden injury or illness-has DNR Identification of a healthcare agent -daughter Eric [name] Review of an advance directive document-MOST  and emailed a sample to her for review.  She has not completed a MOST form yet and was agreeable to my forwarding that form by email for review.  Advised it was a sample form and that if she wanted to complete it that we could arrange to meet to complete it.  Decision not to resuscitate or to de-escalate disease focused treatments due to poor prognosis. CODE STATUS: DNR    Follow up Palliative Care Visit: Palliative care will continue to follow for complex medical decision making, advance care planning, and clarification of goals. Return 4 weeks or prn.    This visit was coded based on medical decision making (MDM).  PPS: 50%  HOSPICE ELIGIBILITY/DIAGNOSIS: TBD  Chief Complaint:  Palliative Care is continuing to follow patient for chronic medical management in setting of dementia and to assist with advanced care planning, refining and defining goals of care.  HISTORY OF PRESENT ILLNESS:  Eric [name] is a 89 y.o. year old male with vascular dementia, afib, HTN, hx of stroke, CAD, COPD, GERD, IDDM with neuropathy, chronic constipation, hx of hyperosmolar hyperglycemic state, cervical foraminal stenosis, dyslipidemia, OA, CKD, BPH, S/P Aortic valve replacement with pericardial tissue valve, s/p left total hip replacement, IDA and Vitamin B 12 deficiency, Severe protein calorie malnutrition and unintended weight loss.  On mechanical soft regular diet, thin liquids. Facility staff indicates that pt can help with bathing and dressing but has to have assistance, that he is incontinent of bowel and bladder, is not eating much but weight has been stable as last weight on 10/22/22 was also 122 lbs.  He was observed bending over in his WC opening and tightening the velcro on his shoes. Says he doesn't have a lot of pain but a little in his right hip. He states last fall was about 6 weeks ago when he fell in the bathroom. Fasting blood sugar today 171, before lunch 85. Followed up with pt's daughter  after visit and advised that pt was teasing, was up OOB and I had heard some of the staff talking about him being resistive to being shaved.  Advised that I heard the unit manager talk to him about trying to shave him tomorrow.  She said she had heard this before but she couldn't understand him having an issue with being shaved.  Advised in later stages of dementia that personal hygiene often was neglected and not uncommon to see.  Advised that pt was up and mobile on the unit in his wc today, his weight and vital signs were stable but he is malnourished and any infection or type of insult could be enough to start a significant decline where he would be appropriate for Hospice and that could happen at any time.  She reports being glad that Palliative will continue to follow up with her Dad and keeping her informed.  History obtained from review of EMR, interview with daughter, facility staff and/or Eric [name].    Facility had labs done in 09/2022 including HGB A1c but results were not available to me. I reviewed EMR for available labs, medications, imaging, studies and related documents.     ROS General: NAD ENMT: denies dysphagia Cardiovascular: denies chest pain, denies DOE Pulmonary: denies cough, denies SOB Abdomen: endorses fair appetite but states not liking food, denies constipation GU: denies dysuria MSK:  leg weakness, no falls reported in last 6 weeks (actually fall was in November) Skin: states skin tears easily so he wears gerisleeves, has multiple small scabbed abrasions of bilateral arms Neurological: denies pain, denies insomnia Psych: Endorses some frustration with having to wait to get OOB Heme/lymph/immuno: denies bruises, abnormal bleeding  Physical Exam: sitting in WC Current and past weights: 122 lbs as of 11/05/22 Constitutional: NAD General: thin/mildly disheveled  ENMT: intact hearing, oral mucous membranes moist, edentulous CV: S1S2, IRIR, rate controlled, no LE  edema Pulmonary: CTAB, no increased work of breathing, occasional NP cough, room air Abdomen: normo-active BS + 4 quadrants, soft and non tender, no ascites GU: deferred MSK: noted sarcopenia of limbs, moves all extremities, mobile in WC with ability to self propel Skin: warm and dry, Healing dried scabbed laceration to lower left shin, Wearing gerisleeves on bilateral arms. Generalized loss of body fat and muscle mass Neuro:  BLE generalized weakness,  noted cognitive impairment Psych: non-anxious affect, A and O x 2 Hem/lymph/immuno: no widespread bruising   Thank you for the opportunity to participate in the care of Eric [name].  The palliative care team will continue to follow. Please call our office at [phone] if we can be of additional assistance.   Marijo Conception, FNP -C  COVID-19 PATIENT SCREENING TOOL Asked and negative response unless otherwise noted:   Have you had symptoms of covid, tested positive or been in contact with someone with symptoms/positive test in the past 5-10 days?  Unknown

## 2022-11-11 NOTE — Telephone Encounter (Signed)
TCT pt's daughter [name].  Introduced self as provider with Palliative Care.  She states that the facility did not seem to be aware of prior referral to Palliative when they made the referral to Hospice for the patient.  She states that her understanding is that pt still gets up to [facility] and self propels, he has lost a great deal of weight since Covid and she has more difficulty understanding him.  She is no averse to Hospice if he is appropriate but just stated she was unaware that he was being followed by Palliative.  Reviewed Dr Cyndi Lennert assessment portion of her note and plan with pt's daughter.  Advised that I am at the facility today and would be happy to meet her to see her father and discuss if she would like or I can see him and call her back.  She states she would like a 2nd opinion on whether or not he seems hospice appropriate.  She states he has been eating less but when she brings his favorite foods/snacks when she comes about every 1-2 weekends that his food is gone.  Advised will see pt and follow back up with her and she expressed appreciation.  Damaris Hippo FNP-C

## 2023-02-25 ENCOUNTER — Encounter: Payer: Self-pay | Admitting: Family Medicine

## 2023-02-25 ENCOUNTER — Non-Acute Institutional Stay: Payer: Medicare Other | Admitting: Family Medicine

## 2023-02-25 VITALS — BP 128/64 | HR 69 | Temp 97.7°F | Resp 14 | Wt 125.0 lb

## 2023-02-25 DIAGNOSIS — E43 Unspecified severe protein-calorie malnutrition: Secondary | ICD-10-CM

## 2023-02-25 DIAGNOSIS — I482 Chronic atrial fibrillation, unspecified: Secondary | ICD-10-CM

## 2023-02-25 DIAGNOSIS — F01518 Vascular dementia, unspecified severity, with other behavioral disturbance: Secondary | ICD-10-CM

## 2023-02-25 NOTE — Progress Notes (Unsigned)
Therapist, nutritional Palliative Care Consult Note Telephone: [phone]  Fax: [phone]   Date of encounter: 02/25/23 2:06 PM PATIENT NAME: Eric [name] [facility]  [address] [place] [address]   [phone] (home)  DOB: May 27, 1927 MRN: [id number] PRIMARY CARE PROVIDER:    Sherron Monday, MD,  [address] [place] [address] [phone]  REFERRING PROVIDER:   Sherron Monday, MD [address] [place],  [place] [address] [phone]  Emergency Contact:    Contact Information     Name Relation Home Work [address]   Beatriz Stallion Daughter   [phone]   Jaleek, Leavins [phone]  [phone]   Howell, Bendon   [phone]       Health Care POA/Health Care Agent   I met face to face with patient in *** facility. Palliative Care was asked to follow this patient by consultation request of Sherron Monday, MD to address advance care planning and complex medical decision making. This is an initial/follow up visit.  ADVANCE CARE PLANNING/GOALS OF CARE: Patient/health care surrogate gave his/her permission to discuss.   PRESENT FOR DISCUSSION: GOALS: BARRIERS:   Our advance care planning conversation included a discussion about:    The value and importance of advance care planning  Experiences with loved ones who have been seriously ill or have died  Exploration of personal, cultural or spiritual beliefs that might influence medical decisions  Review, updating or creation of an advance directive document.  CODE STATUS:   I spent ***   minutes providing this consultation with more than 50% of the time spent on counseling patient and coordinating communication with referring provider, staff/family, chart review and  documentation. --------------------------------------------------------------------------------------------------------------------------------------------------------------------------------------------  ASSESSMENT AND / RECOMMENDATIONS:  PPS:    Follow up Palliative Care Visit:  Palliative Care continuing to follow up by monitoring for changes in appetite, weight, functional and cognitive status for chronic disease progression and management in agreement with patient's stated goals of care. Next visit in *** weeks or prn.  This visit was coded based on medical decision making (MDM).  Chief Complaint  Palliative Care is following for chronic medical management in setting of dementia and assistance with advance care planning and goals of care.  HISTORY OF PRESENT ILLNESS: Eric [name] is a 89 y.o. year old male with dementia seen sleeping in bed having brief periods of apnea, breathing with his mouth open. He does not arouse to having his name called or touching his shoulder.  He does make some effort to withdraw his arm when attempting to check his BP.  Facility staff state at times he doesn't sleep at night and will sleep all day.  He was noted up yesterday all day in the facility.  He eats well normally and current weight is stable. He is unable to provide any hx currently. Does not appear to have facial grimace or other indication of pain.   ACTIVITIES OF DAILY LIVING: CONTINENT OF BLADDER/ BOWEL? Incontinent at times  BATHING/DRESSING/FEEDING: Independent  MOBILITY:   WHEELCHAIR-transfers self from chair to [facility]  APPETITE? good WEIGHT: 125 lbs as of 02/19/23, weight in Jan 2024 was 122 lbs  CURRENT PROBLEM LIST:  Patient Active Problem List   Diagnosis Date Noted   Palliative care encounter 11/11/2022   Hyperosmolar hyperglycemic state (HHS) ([name] 09/05/2020   Abnormal CT of the abdomen 05/19/2020   Dilated bile duct 05/19/2020   Abnormal MRI, liver 05/19/2020   Pressure  injury of skin  01/30/2020   Fall    Hypoglycemia    Atrial fibrillation ([name]    Abnormal liver diagnostic imaging    Severe protein-calorie malnutrition ([name] 01/06/2018   Fall at nursing home 01/05/2018   Arthritis of left wrist 12/28/2017   Hypokalemia 11/26/2017   Weight loss, non-intentional 11/24/2017   Acute pain of right shoulder 11/24/2017   CAD (coronary artery disease), native coronary artery 11/02/2017   Vascular dementia with behavior disturbance ([name] 11/02/2017   Chronic constipation 11/02/2017   Chronic diastolic CHF (congestive heart failure) ([name] 10/28/2017   Tachycardia 10/25/2017   Major depressive disorder, single episode, severe without psychotic features ([name] 10/09/2017   Stroke ([name]    History of aortic stenosis    Foraminal stenosis of cervical region    Essential hypertension, benign    Colon polyp    Cervicalgia    Cerebrovascular disease, unspecified    Vitamin B12 deficiency 06/02/2016   Anemia, iron deficiency 02/28/2016   Dyslipidemia associated with type 2 diabetes mellitus ([name]    S/P CABG (coronary artery bypass graft) 12/25/2015   S/P AVR (aortic valve replacement) 12/25/2015   COPD (chronic obstructive pulmonary disease) ([name] 10/21/2015   GERD (gastroesophageal reflux disease) 07/08/2014   Status post total replacement of left hip 06/30/2014   Medicare annual wellness visit, subsequent 03/08/2012   CKD (chronic kidney disease) stage 3, GFR 30-59 ml/min ([name] 09/24/2010   BPH (benign prostatic hypertrophy) with urinary obstruction 01/10/2010   History of CVA (cerebrovascular accident) 02/28/2009   Insulin dependent diabetes mellitus with complications ([name] 12/17/2006   Diabetic neuropathy associated with type 2 diabetes mellitus ([name] 12/17/2006   PAST MEDICAL HISTORY:  Active Ambulatory Problems    Diagnosis Date Noted   Insulin dependent diabetes mellitus with complications ([name] 12/17/2006   Diabetic neuropathy associated with  type 2 diabetes mellitus ([name] 12/17/2006   History of CVA (cerebrovascular accident) 02/28/2009   CKD (chronic kidney disease) stage 3, GFR 30-59 ml/min ([name] 09/24/2010   BPH (benign prostatic hypertrophy) with urinary obstruction 01/10/2010   Medicare annual wellness visit, subsequent 03/08/2012   Status post total replacement of left hip 06/30/2014   GERD (gastroesophageal reflux disease) 07/08/2014   S/P CABG (coronary artery bypass graft) 12/25/2015   S/P AVR (aortic valve replacement) 12/25/2015   Dyslipidemia associated with type 2 diabetes mellitus ([name]    Anemia, iron deficiency 02/28/2016   Vitamin B12 deficiency 06/02/2016   COPD (chronic obstructive pulmonary disease) ([name] 10/21/2015   Stroke ([name]    History of aortic stenosis    Foraminal stenosis of cervical region    Essential hypertension, benign    Colon polyp    Cervicalgia    Cerebrovascular disease, unspecified    Major depressive disorder, single episode, severe without psychotic features ([name] 10/09/2017   Tachycardia 10/25/2017   Chronic diastolic CHF (congestive heart failure) ([name] 10/28/2017   CAD (coronary artery disease), native coronary artery 11/02/2017   Vascular dementia with behavior disturbance ([name] 11/02/2017   Chronic constipation 11/02/2017   Weight loss, non-intentional 11/24/2017   Acute pain of right shoulder 11/24/2017   Hypokalemia 11/26/2017   Arthritis of left wrist 12/28/2017   Fall at nursing home 01/05/2018   Severe protein-calorie malnutrition ([name] 01/06/2018   Fall    Hypoglycemia    Atrial fibrillation ([name]    Abnormal liver diagnostic imaging    Pressure injury of skin 01/30/2020   Abnormal CT of the abdomen 05/19/2020   Dilated bile duct 05/19/2020   Abnormal MRI,  liver 05/19/2020   Hyperosmolar hyperglycemic state (HHS) ([name] 09/05/2020   Palliative care encounter 11/11/2022   Resolved Ambulatory Problems    Diagnosis Date Noted   HYPERCHOLESTEROLEMIA 12/17/2006    CERUMEN IMPACTION 09/24/2010   HYPERTENSION, BENIGN SYSTEMIC 12/17/2006   Skin lesion 12/01/2011   Cerumen impaction 03/08/2012   Shingles 07/27/2012   Left hip pain 11/03/2013   Elbow laceration 11/03/2013   Falls 11/03/2013   Delirium, drug-induced 07/02/2014   Paroxysmal atrial fibrillation ([name] 07/02/2014   Advanced care planning/counseling discussion 03/29/2015   Bronchitis 12/25/2015   Acute respiratory distress 12/25/2015   AKI (acute kidney injury) ([name] 12/25/2015   Dehydration    Atrial fibrillation with RVR ([name] 12/25/2015   Sepsis ([name]    COPD exacerbation ([name]    Metabolic acidosis    Dilated cardiomyopathy ([name]    Pulmonary hypertension ([name]    H/O aortic valve replacement    Essential hypertension    Uncontrolled type 2 diabetes mellitus with complication    Acute on chronic renal failure ([name]    Hyponatremia    Hypotension    Delirium    Acute renal failure (ARF) ([name]    Pressure ulcer 01/07/2016   Slurred speech    Bacteremia    Acute endocarditis    Carotid stenosis 02/28/2016   Lesion of right external ear 08/12/2016   Acute diastolic (congestive) heart failure ([name] 04/13/2017   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   Shingles 07/27/2012   Esophageal reflux    Diabetes mellitus without complication ([name]    Bilateral hydrocele 12/18/2009   Acute endocarditis    Pneumonia 10/25/2017   Anemia 10/25/2017   HCAP (healthcare-associated pneumonia) 10/25/2017   Acute kidney injury superimposed on CKD ([name] 10/28/2017   Dementia ([name] 10/28/2017   Acute metabolic encephalopathy 10/28/2017   Sepsis ([name] 01/28/2020   Shock ([name]    Choledocholithiasis    Past Medical History:  Diagnosis Date   CHF (congestive heart failure) ([name]    Coronary atherosclerosis of unspecified type of vessel, native or graft    Hyperlipemia    Hypertension    Iron deficiency    Major depressive disorder    Type II or unspecified type diabetes  mellitus with neurological manifestations, not stated as uncontrolled(250.60)    SOCIAL HX:  Social History   Tobacco Use   Smoking status: Never   Smokeless tobacco: Never  Substance Use Topics   Alcohol use: Not Currently    Comment: occasional   FAMILY HX:  Family History  Problem Relation Age of Onset   Heart attack Father 28   Breast cancer Mother 4   Brain cancer Sister    Prostate cancer Brother    Diabetes Neg Hx    Stroke Neg Hx    Colon cancer Neg Hx    Liver disease Neg Hx    Pancreatic cancer Neg Hx    Rectal cancer Neg Hx    Stomach cancer Neg Hx       Preferred Pharmacy: ALLERGIES:  Allergies  Allergen Reactions   Cyanocobalamin [Vitamin B12] Other (See Comments)    Leg swelling to gummy B12 vitamin   Vitamin B12 Swelling and Other (See Comments)    A "gummie" version caused swelling of the legs   Ampicillin Rash    Skin rash   Ampicillin Rash     PERTINENT MEDICATIONS:  Outpatient Encounter Medications as of 02/25/2023  Medication Sig   acetaminophen (TYLENOL) 325 MG tablet Take 650 mg  by mouth 3 (three) times daily.   FLUoxetine (PROZAC) 20 MG capsule Take 20 mg by mouth daily.   glucagon (GLUCAGEN) 1 MG SOLR injection Inject 1 mg into the muscle once as needed for low blood sugar.   insulin glargine-yfgn (SEMGLEE) 100 UNIT/ML Pen Inject 10 Units into the skin daily.   insulin lispro (ADMELOG SOLOSTAR) 100 UNIT/ML KwikPen Inject into the skin. INJECT PER SLIDING SCALE TID   ipratropium (ATROVENT HFA) 17 MCG/ACT inhaler Inhale 2 puffs into the lungs QID.   mirtazapine (REMERON) 7.5 MG tablet Take 7.5 mg by mouth at bedtime.    nitroGLYCERIN (NITROSTAT) 0.4 MG SL tablet Place 0.4 mg under the tongue every 5 (five) minutes as needed for chest pain.   Nutritional Supplements (FEEDING SUPPLEMENT, GLUCERNA 1.2 CAL,) LIQD Take 237 mLs by mouth 3 (three) times daily after meals.   Nutritional Supplements (NUTRITIONAL SUPPLEMENT PO) Take 1 each by mouth in  the morning and at bedtime. AHR frozen nutritional treat BID   ondansetron (ZOFRAN) 4 MG tablet Take 4 mg by mouth every 8 (eight) hours as needed for nausea or vomiting.   OXYGEN Give 2 liters via nasal cannula as needed for sats below 90%   sennosides-docusate sodium (SENOKOT-S) 8.6-50 MG tablet Take 1 tablet by mouth at bedtime. Hold for loose or liquid stools   simethicone (MYLICON) 80 MG chewable tablet Chew 80 mg by mouth 4 (four) times daily -  before meals and at bedtime.   Tiotropium Bromide Monohydrate (SPIRIVA RESPIMAT) 2.5 MCG/ACT AERS Inhale 2 each into the lungs daily.   No facility-administered encounter medications on file as of 02/25/2023.    History obtained from review of EMR, discussion facility staff/caregiver, unable to obtain from patient.     I reviewed available labs, medications, imaging, studies and related documents from the EMR.  There were no new records/imaging since last visit.   Physical Exam: GENERAL: NAD LUNGS: CTAB, no increased work of breathing, room air CARDIAC:  S1S2, IRRR with no MRG, N0 edema/cyanosis ABD:  Normo-active BS x 4 quads, soft, non-tender EXTREMITIES:  Yes muscle atrophy/subcutaneous fat loss in hands NEURO/psych:  Somnolent, unable to assess   Thank you for the opportunity to participate in the care of [facility]. Please call our main office at [phone] if we can be of additional assistance.    Joycelyn Man FNP-C  [email or web address] .[facility]  Phone:  [phone]

## 2023-02-26 ENCOUNTER — Encounter: Payer: Self-pay | Admitting: Family Medicine

## 2023-04-09 ENCOUNTER — Encounter: Payer: Self-pay | Admitting: Family Medicine

## 2023-04-09 ENCOUNTER — Non-Acute Institutional Stay: Payer: Medicare Other | Admitting: Family Medicine

## 2023-04-09 VITALS — BP 128/68 | HR 61 | Temp 97.6°F | Resp 16

## 2023-04-09 DIAGNOSIS — F01518 Vascular dementia, unspecified severity, with other behavioral disturbance: Secondary | ICD-10-CM

## 2023-04-09 DIAGNOSIS — I480 Paroxysmal atrial fibrillation: Secondary | ICD-10-CM

## 2023-04-09 DIAGNOSIS — E43 Unspecified severe protein-calorie malnutrition: Secondary | ICD-10-CM

## 2023-04-09 NOTE — Progress Notes (Signed)
Therapist, nutritional Palliative Care Consult Note Telephone: [phone]  Fax: [phone]   Date of encounter: 04/09/23 11:31 PM PATIENT NAME: Eric [name] [facility]  [address] [place] [address]   [phone] (home)  DOB: [birth year] MRN: [id number] PRIMARY CARE PROVIDER:    Sherron Monday, MD,  [address] [place] [address] [phone]  REFERRING PROVIDER:   Sherron Monday, MD [address] [place],  [place] [address] [phone]  Emergency Contact:    Contact Information     Name Relation Home Work [address]   Beatriz Stallion Daughter   [phone]   Maahir, [place] [phone]  [phone]   Kailee, [place]meier   [phone]       Health Care Agent-daughter Beatriz Stallion   I met face to face with patient in [facility]. Palliative Care was asked to follow this patient by consultation request of Sherron Monday, MD to address advance care planning and complex medical decision making. This is a follow up visit.   Review of an existing advance directive document.  CODE STATUS: DNR  Chief C/O: Palliative Care is continuing to follow pt for chronic medical management in setting of Dementia.  ASSESSMENT AND / RECOMMENDATIONS:  PPS: 50%  Vascular Dementia with Behavior Disturbance FAST 7 score 5 Continues on Prozac and Remeron for mood stabilization and depression Not currently on any specific meds for dementia.   Severe protein calorie malnutrition Current weight is stable. Continue Remeron for appetite stimulation, and house diabetic protein supplement TID.  3.    Chronic atrial fibrillation Stable, rate controlled without medication.   Given age and fall risk is not on anticoagulation.   Follow up Palliative Care Visit:  Palliative Care continuing to follow up by monitoring for changes in appetite, weight, functional and cognitive status for chronic disease progression and  management in agreement with patient's stated goals of care. Next visit in 4 weeks or prn.  This visit was coded based on medical decision making (MDM).  Chief Complaint  Palliative Care is following for chronic medical management in setting of dementia and assistance with advance care planning and goals of care.  HISTORY OF PRESENT ILLNESS: Eric [name] is a 89 y.o. year old male with dementia seen wheeling himself in the hall in his wheelchair. He eats well normally. He is unable to provide reliable hx but denies pain, nausea and vomiting.  Does not appear to have facial grimace or other indication of pain.   ACTIVITIES OF DAILY LIVING: CONTINENT OF BLADDER/ BOWEL? Incontinent at times  BATHING/DRESSING/FEEDING: Independent  MOBILITY:   WHEELCHAIR-transfers self from chair to [facility]  APPETITE? good WEIGHT: 125 lbs as of 02/19/23, weight in Jan 2024 was 122 lbs  CURRENT PROBLEM LIST:  Patient Active Problem List   Diagnosis Date Noted   Palliative care encounter 11/11/2022   Hyperosmolar hyperglycemic state (HHS) ([name] 09/05/2020   Abnormal CT of the abdomen 05/19/2020   Dilated bile duct 05/19/2020   Abnormal MRI, liver 05/19/2020   Pressure injury of skin 01/30/2020   Fall    Hypoglycemia    Atrial fibrillation ([name]    Abnormal liver diagnostic imaging    Severe protein-calorie malnutrition ([name] 01/06/2018   Fall at nursing home 01/05/2018   Arthritis of left wrist 12/28/2017   Hypokalemia 11/26/2017   Weight loss, non-intentional 11/24/2017   Acute pain of right shoulder 11/24/2017   CAD (coronary artery disease), native coronary artery 11/02/2017   Vascular  dementia with behavior disturbance ([name] 11/02/2017   Chronic constipation 11/02/2017   Chronic diastolic CHF (congestive heart failure) ([name] 10/28/2017   Tachycardia 10/25/2017   Major depressive disorder, single episode, severe without psychotic features ([name] 10/09/2017   Stroke ([name]    History of aortic  stenosis    Foraminal stenosis of cervical region    Essential hypertension, benign    Colon polyp    Cervicalgia    Cerebrovascular disease, unspecified    Vitamin B12 deficiency 06/02/2016   Anemia, iron deficiency 02/28/2016   Dyslipidemia associated with type 2 diabetes mellitus ([name]    S/P CABG (coronary artery bypass graft) 12/25/2015   S/P AVR (aortic valve replacement) 12/25/2015   COPD (chronic obstructive pulmonary disease) ([name] 10/21/2015   GERD (gastroesophageal reflux disease) 07/08/2014   Status post total replacement of left hip 06/30/2014   Medicare annual wellness visit, subsequent 03/08/2012   CKD (chronic kidney disease) stage 3, GFR 30-59 ml/min ([name] 09/24/2010   BPH (benign prostatic hypertrophy) with urinary obstruction 01/10/2010   History of CVA (cerebrovascular accident) 02/28/2009   Insulin dependent diabetes mellitus with complications ([name] 12/17/2006   Diabetic neuropathy associated with type 2 diabetes mellitus ([name] 12/17/2006   PAST MEDICAL HISTORY:  Active Ambulatory Problems    Diagnosis Date Noted   Insulin dependent diabetes mellitus with complications ([name] 12/17/2006   Diabetic neuropathy associated with type 2 diabetes mellitus ([name] 12/17/2006   History of CVA (cerebrovascular accident) 02/28/2009   CKD (chronic kidney disease) stage 3, GFR 30-59 ml/min ([name] 09/24/2010   BPH (benign prostatic hypertrophy) with urinary obstruction 01/10/2010   Medicare annual wellness visit, subsequent 03/08/2012   Status post total replacement of left hip 06/30/2014   GERD (gastroesophageal reflux disease) 07/08/2014   S/P CABG (coronary artery bypass graft) 12/25/2015   S/P AVR (aortic valve replacement) 12/25/2015   Dyslipidemia associated with type 2 diabetes mellitus ([name]    Anemia, iron deficiency 02/28/2016   Vitamin B12 deficiency 06/02/2016   COPD (chronic obstructive pulmonary disease) ([name] 10/21/2015   Stroke ([name]    History of aortic  stenosis    Foraminal stenosis of cervical region    Essential hypertension, benign    Colon polyp    Cervicalgia    Cerebrovascular disease, unspecified    Major depressive disorder, single episode, severe without psychotic features ([name] 10/09/2017   Tachycardia 10/25/2017   Chronic diastolic CHF (congestive heart failure) ([name] 10/28/2017   CAD (coronary artery disease), native coronary artery 11/02/2017   Vascular dementia with behavior disturbance ([name] 11/02/2017   Chronic constipation 11/02/2017   Weight loss, non-intentional 11/24/2017   Acute pain of right shoulder 11/24/2017   Hypokalemia 11/26/2017   Arthritis of left wrist 12/28/2017   Fall at nursing home 01/05/2018   Severe protein-calorie malnutrition ([name] 01/06/2018   Fall    Hypoglycemia    Atrial fibrillation ([name]    Abnormal liver diagnostic imaging    Pressure injury of skin 01/30/2020   Abnormal CT of the abdomen 05/19/2020   Dilated bile duct 05/19/2020   Abnormal MRI, liver 05/19/2020   Hyperosmolar hyperglycemic state (HHS) ([name] 09/05/2020   Palliative care encounter 11/11/2022   Resolved Ambulatory Problems    Diagnosis Date Noted   HYPERCHOLESTEROLEMIA 12/17/2006   CERUMEN IMPACTION 09/24/2010   HYPERTENSION, BENIGN SYSTEMIC 12/17/2006   Skin lesion 12/01/2011   Cerumen impaction 03/08/2012   Shingles 07/27/2012   Left hip pain 11/03/2013   Elbow laceration 11/03/2013   Falls 11/03/2013  Delirium, drug-induced 07/02/2014   Paroxysmal atrial fibrillation ([name] 07/02/2014   Advanced care planning/counseling discussion 03/29/2015   Bronchitis 12/25/2015   Acute respiratory distress 12/25/2015   AKI (acute kidney injury) ([name] 12/25/2015   Dehydration    Atrial fibrillation with RVR ([name] 12/25/2015   Sepsis ([name]    COPD exacerbation ([name]    Metabolic acidosis    Dilated cardiomyopathy ([name]    Pulmonary hypertension ([name]    H/O aortic valve replacement    Essential hypertension     Uncontrolled type 2 diabetes mellitus with complication    Acute on chronic renal failure ([name]    Hyponatremia    Hypotension    Delirium    Acute renal failure (ARF) ([name]    Pressure ulcer 01/07/2016   Slurred speech    Bacteremia    Acute endocarditis    Carotid stenosis 02/28/2016   Lesion of right external ear 08/12/2016   Acute diastolic (congestive) heart failure ([name] 04/13/2017   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   Shingles 07/27/2012   Esophageal reflux    Diabetes mellitus without complication ([name]    Bilateral hydrocele 12/18/2009   Acute endocarditis    Pneumonia 10/25/2017   Anemia 10/25/2017   HCAP (healthcare-associated pneumonia) 10/25/2017   Acute kidney injury superimposed on CKD ([name] 10/28/2017   Dementia ([name] 10/28/2017   Acute metabolic encephalopathy 10/28/2017   Sepsis ([name] 01/28/2020   Shock ([name]    Choledocholithiasis    Past Medical History:  Diagnosis Date   CHF (congestive heart failure) ([name]    Coronary atherosclerosis of unspecified type of vessel, native or graft    Hyperlipemia    Hypertension    Iron deficiency    Major depressive disorder    Type II or unspecified type diabetes mellitus with neurological manifestations, not stated as uncontrolled(250.60)    SOCIAL HX:  Social History   Tobacco Use   Smoking status: Never   Smokeless tobacco: Never  Substance Use Topics   Alcohol use: Not Currently    Comment: occasional   FAMILY HX:  Family History  Problem Relation Age of Onset   Heart attack Father 72   Breast cancer Mother 53   Brain cancer Sister    Prostate cancer Brother    Diabetes Neg Hx    Stroke Neg Hx    Colon cancer Neg Hx    Liver disease Neg Hx    Pancreatic cancer Neg Hx    Rectal cancer Neg Hx    Stomach cancer Neg Hx       Preferred Pharmacy: ALLERGIES:  Allergies  Allergen Reactions   Cyanocobalamin [Vitamin B12] Other (See Comments)    Leg swelling to gummy B12 vitamin    Vitamin B12 Swelling and Other (See Comments)    A "gummie" version caused swelling of the legs   Ampicillin Rash    Skin rash   Ampicillin Rash     PERTINENT MEDICATIONS:  Outpatient Encounter Medications as of 04/09/2023  Medication Sig   acetaminophen (TYLENOL) 325 MG tablet Take 650 mg by mouth 3 (three) times daily.   FLUoxetine (PROZAC) 20 MG capsule Take 20 mg by mouth daily.   glucagon (GLUCAGEN) 1 MG SOLR injection Inject 1 mg into the muscle once as needed for low blood sugar.   insulin glargine-yfgn (SEMGLEE) 100 UNIT/ML Pen Inject 10 Units into the skin daily.   insulin lispro (ADMELOG SOLOSTAR) 100 UNIT/ML KwikPen Inject into the skin. INJECT PER SLIDING SCALE TID  ipratropium (ATROVENT HFA) 17 MCG/ACT inhaler Inhale 2 puffs into the lungs QID.   mirtazapine (REMERON) 7.5 MG tablet Take 7.5 mg by mouth at bedtime.    nitroGLYCERIN (NITROSTAT) 0.4 MG SL tablet Place 0.4 mg under the tongue every 5 (five) minutes as needed for chest pain.   Nutritional Supplements (FEEDING SUPPLEMENT, GLUCERNA 1.2 CAL,) LIQD Take 237 mLs by mouth 3 (three) times daily after meals.   Nutritional Supplements (NUTRITIONAL SUPPLEMENT PO) Take 1 each by mouth in the morning and at bedtime. AHR frozen nutritional treat BID   ondansetron (ZOFRAN) 4 MG tablet Take 4 mg by mouth every 8 (eight) hours as needed for nausea or vomiting.   OXYGEN Give 2 liters via nasal cannula as needed for sats below 90%   sennosides-docusate sodium (SENOKOT-S) 8.6-50 MG tablet Take 1 tablet by mouth at bedtime. Hold for loose or liquid stools   simethicone (MYLICON) 80 MG chewable tablet Chew 80 mg by mouth 4 (four) times daily -  before meals and at bedtime.   Tiotropium Bromide Monohydrate (SPIRIVA RESPIMAT) 2.5 MCG/ACT AERS Inhale 2 each into the lungs daily.   No facility-administered encounter medications on file as of 04/09/2023.    History obtained from review of EMR, discussion facility staff/caregiver, unable  to obtain from patient.     I reviewed available labs, medications, imaging, studies and related documents from the EMR.  There were no new records/imaging since last visit.   Physical Exam: GENERAL: NAD EENT:  very HOH LUNGS: CTAB but diminished, no increased work of breathing, room air CARDIAC:  S1S2, IRRR with no MRG, No edema/cyanosis ABD:  Normo-active BS x 4 quads, soft, non-tender EXTREMITIES:  Yes muscle atrophy/subcutaneous fat loss in hands NEURO/psych:  Awake, alert, smiling and pleasant   Thank you for the opportunity to participate in the care of [facility]. Please call our main office at [phone] if we can be of additional assistance.    Joycelyn Man FNP-C  [name] .[facility]  Phone:  [phone]

## 2023-07-07 ENCOUNTER — Emergency Department (HOSPITAL_COMMUNITY)
Admission: EM | Admit: 2023-07-07 | Discharge: 2023-07-08 | Disposition: A | Discharge status: Home / Self Care | Payer: Medicare Other | Attending: Emergency Medicine | Admitting: Emergency Medicine

## 2023-07-07 ENCOUNTER — Emergency Department (HOSPITAL_COMMUNITY): Payer: Medicare Other

## 2023-07-07 DIAGNOSIS — Y92129 Unspecified place in nursing home as the place of occurrence of the external cause: Secondary | ICD-10-CM | POA: Diagnosis not present

## 2023-07-07 DIAGNOSIS — I509 Heart failure, unspecified: Secondary | ICD-10-CM | POA: Insufficient documentation

## 2023-07-07 DIAGNOSIS — F0390 Unspecified dementia without behavioral disturbance: Secondary | ICD-10-CM | POA: Insufficient documentation

## 2023-07-07 DIAGNOSIS — I110 Hypertensive heart disease with heart failure: Secondary | ICD-10-CM | POA: Diagnosis not present

## 2023-07-07 DIAGNOSIS — Z794 Long term (current) use of insulin: Secondary | ICD-10-CM | POA: Diagnosis not present

## 2023-07-07 DIAGNOSIS — W19XXXA Unspecified fall, initial encounter: Secondary | ICD-10-CM

## 2023-07-07 DIAGNOSIS — W0110XA Fall on same level from slipping, tripping and stumbling with subsequent striking against unspecified object, initial encounter: Secondary | ICD-10-CM | POA: Diagnosis not present

## 2023-07-07 DIAGNOSIS — Z043 Encounter for examination and observation following other accident: Secondary | ICD-10-CM | POA: Diagnosis present

## 2023-07-07 NOTE — ED Triage Notes (Signed)
Pt from [facility], c/o fall, hx of fall, HTN , afib, diabetes , chf, - thinners per EMS , no lac per EMS, pt hit back of head from standing position, hx of dementia

## 2023-07-08 NOTE — ED Provider Notes (Signed)
[place] EMERGENCY DEPARTMENT AT [facility] Provider Note   CSN: [id number] Arrival date & time: 07/07/23  2120     History  Chief Complaint  Patient presents with   Eric [name] is a 89 y.o. male.  HPI    89 year old male comes in with chief complaint of fall.  Patient has history of hypertension, A-fib, CHF, but patient allegedly not on any blood thinners.  He lives at a nursing home because of his dementia.  Patient had a witnessed fall earlier today from standing position.  Patient struck the back of his head in the process.  Patient has no complaints from his side.  Specifically denies any headache, chest pain, abdominal pain or back pain.  Home Medications Prior to Admission medications   Medication Sig Start Date End Date Taking? Authorizing Provider  acetaminophen (TYLENOL) 325 MG tablet Take 650 mg by mouth 3 (three) times daily.    [provider]  FLUoxetine (PROZAC) 20 MG capsule Take 20 mg by mouth daily. 07/24/20   [provider]  glucagon (GLUCAGEN) 1 MG SOLR injection Inject 1 mg into the muscle once as needed for low blood sugar.    [provider]  insulin glargine-yfgn (SEMGLEE) 100 UNIT/ML Pen Inject 10 Units into the skin daily.    [provider]  insulin lispro (ADMELOG SOLOSTAR) 100 UNIT/ML KwikPen Inject into the skin. INJECT PER SLIDING SCALE TID    [provider]  ipratropium (ATROVENT HFA) 17 MCG/ACT inhaler Inhale 2 puffs into the lungs QID.    [provider]  mirtazapine (REMERON) 7.5 MG tablet Take 7.5 mg by mouth at bedtime.  11/25/17 09/05/20  [provider]  nitroGLYCERIN (NITROSTAT) 0.4 MG SL tablet Place 0.4 mg under the tongue every 5 (five) minutes as needed for chest pain.    [provider]  Nutritional Supplements (FEEDING SUPPLEMENT, GLUCERNA 1.2 CAL,) LIQD Take 237 mLs by mouth 3 (three) times daily after meals.    [provider]   Nutritional Supplements (NUTRITIONAL SUPPLEMENT PO) Take 1 each by mouth in the morning and at bedtime. AHR frozen nutritional treat BID    [provider]  ondansetron (ZOFRAN) 4 MG tablet Take 4 mg by mouth every 8 (eight) hours as needed for nausea or vomiting.    [provider]  OXYGEN Give 2 liters via nasal cannula as needed for sats below 90%    [provider]  sennosides-docusate sodium (SENOKOT-S) 8.6-50 MG tablet Take 1 tablet by mouth at bedtime. Hold for loose or liquid stools 01/01/18   [provider]  simethicone (MYLICON) 80 MG chewable tablet Chew 80 mg by mouth 4 (four) times daily -  before meals and at bedtime.    [provider]  Tiotropium Bromide Monohydrate (SPIRIVA RESPIMAT) 2.5 MCG/ACT AERS Inhale 2 each into the lungs daily.    [provider]      Allergies    Cyanocobalamin [vitamin b12], Vitamin b12, Ampicillin, and Ampicillin    Review of Systems   Review of Systems  Physical Exam Updated Vital Signs BP 118/67 (BP Location: Right Arm)   Pulse 63   Temp 98.8 F (37.1 C) (Oral)   Resp 19   SpO2 96%  Physical Exam Vitals and nursing note reviewed.  Constitutional:      Appearance: He is well-developed.  HENT:     Head: Atraumatic.  Eyes:     Extraocular Movements: Extraocular movements  intact.     Pupils: Pupils are equal, round, and reactive to light.  Cardiovascular:     Rate and Rhythm: Normal rate.  Pulmonary:     Effort: Pulmonary effort is normal.  Musculoskeletal:     Cervical back: Neck supple.     Comments: Head to toe evaluation shows no hematoma, bleeding of the scalp, no facial abrasions, no spine step offs, crepitus of the chest or neck, no tenderness to palpation of the bilateral upper and lower extremities, no gross deformities, no chest tenderness, no pelvic pain.   Skin:    General: Skin is warm.  Neurological:     Mental Status: He is alert and oriented to person, place,  and time.     ED Results / Procedures / Treatments   Labs (all labs ordered are listed, but only abnormal results are displayed) Labs Reviewed - No data to display  EKG None  Radiology DG HIP UNILAT WITH PELVIS 2-3 VIEWS RIGHT  Result Date: 07/07/2023 CLINICAL DATA:  Recent fall with right hip pain, initial encounter EXAM: DG HIP (WITH OR WITHOUT PELVIS) 3V RIGHT COMPARISON:  08/27/2020 FINDINGS: Left hip prosthesis is noted. Pelvic ring is intact. There is suggestion of deformity of the proximal right femur incompletely evaluated on this exam. IMPRESSION: Irregularity of the proximal right femur suspicious for fracture although incompletely evaluated on this exam. CT is recommended for further evaluation. Electronically Signed   By: Alcide Clever M.D.   On: 07/07/2023 22:52   DG Chest Port 1 View  Result Date: 07/07/2023 CLINICAL DATA:  Recent fall with shortness of breath, initial encounter EXAM: PORTABLE CHEST 1 VIEW COMPARISON:  08/27/2020 FINDINGS: Cardiac shadow is stable. Postsurgical changes are again seen. The lungs are well aerated bilaterally. No effusion or pneumothorax is seen. No bony abnormality is noted. Aortic calcifications are again seen and stable. IMPRESSION: No active disease. Electronically Signed   By: Alcide Clever M.D.   On: 07/07/2023 22:50   CT Head Wo Contrast  Result Date: 07/07/2023 CLINICAL DATA:  89 year old male fell and hit back of his head from the standing position. EXAM: CT HEAD WITHOUT CONTRAST CT CERVICAL SPINE WITHOUT CONTRAST TECHNIQUE: Multidetector CT imaging of the head and cervical spine was performed following the standard protocol without intravenous contrast. Multiplanar CT image reconstructions of the cervical spine were also generated. RADIATION DOSE REDUCTION: This exam was performed according to the departmental dose-optimization program which includes automated exposure control, adjustment of the mA and/or kV according to patient size and/or  use of iterative reconstruction technique. COMPARISON:  CT head and cervical spine 08/27/2020 FINDINGS: CT HEAD FINDINGS Brain: No intracranial hemorrhage, mass effect, or evidence of acute infarct. No hydrocephalus. No extra-axial fluid collection. Age related cerebral atrophy and chronic small vessel ischemic disease. Chronic bilateral cerebellar infarcts. Chronic right parietal and occipital infarcts. Vascular: No hyperdense vessel. Intracranial arterial calcification. Skull: No fracture or focal lesion. Sinuses/Orbits: No acute finding. Other: None. CT CERVICAL SPINE FINDINGS Alignment: No change from 08/27/2020. No evidence of traumatic malalignment. Skull base and vertebrae: No acute fracture. No primary bone lesion or focal pathologic process. Soft tissues and spinal canal: No prevertebral fluid or swelling. No visible canal hematoma. Disc levels: Multilevel age-related degenerative spondylosis greatest at C4-C7, slightly progressed from 08/27/2020. Facet arthropathy with ankylosis of the left C3 and C4 facets. Upper chest: No acute abnormality. Other: Carotid calcification. IMPRESSION: 1. No acute intracranial abnormality. Age related cerebral atrophy and chronic small vessel ischemic disease with chronic  infarcts as described. 2. No acute fracture in the cervical spine. Multilevel degenerative spondylosis. Electronically Signed   By: Minerva Fester M.D.   On: 07/07/2023 22:42   CT Cervical Spine Wo Contrast  Result Date: 07/07/2023 CLINICAL DATA:  89 year old male fell and hit back of his head from the standing position. EXAM: CT HEAD WITHOUT CONTRAST CT CERVICAL SPINE WITHOUT CONTRAST TECHNIQUE: Multidetector CT imaging of the head and cervical spine was performed following the standard protocol without intravenous contrast. Multiplanar CT image reconstructions of the cervical spine were also generated. RADIATION DOSE REDUCTION: This exam was performed according to the departmental dose-optimization  program which includes automated exposure control, adjustment of the mA and/or kV according to patient size and/or use of iterative reconstruction technique. COMPARISON:  CT head and cervical spine 08/27/2020 FINDINGS: CT HEAD FINDINGS Brain: No intracranial hemorrhage, mass effect, or evidence of acute infarct. No hydrocephalus. No extra-axial fluid collection. Age related cerebral atrophy and chronic small vessel ischemic disease. Chronic bilateral cerebellar infarcts. Chronic right parietal and occipital infarcts. Vascular: No hyperdense vessel. Intracranial arterial calcification. Skull: No fracture or focal lesion. Sinuses/Orbits: No acute finding. Other: None. CT CERVICAL SPINE FINDINGS Alignment: No change from 08/27/2020. No evidence of traumatic malalignment. Skull base and vertebrae: No acute fracture. No primary bone lesion or focal pathologic process. Soft tissues and spinal canal: No prevertebral fluid or swelling. No visible canal hematoma. Disc levels: Multilevel age-related degenerative spondylosis greatest at C4-C7, slightly progressed from 08/27/2020. Facet arthropathy with ankylosis of the left C3 and C4 facets. Upper chest: No acute abnormality. Other: Carotid calcification. IMPRESSION: 1. No acute intracranial abnormality. Age related cerebral atrophy and chronic small vessel ischemic disease with chronic infarcts as described. 2. No acute fracture in the cervical spine. Multilevel degenerative spondylosis. Electronically Signed   By: Minerva Fester M.D.   On: 07/07/2023 22:42    Procedures Procedures    Medications Ordered in ED Medications - No data to display  ED Course/ Medical Decision Making/ A&P                                 Medical Decision Making Amount and/or Complexity of Data Reviewed Radiology: ordered.  89 year old male with history of dementia, A-fib, hypertension comes in with chief complaint of fall.  DDx considered includes: - Mechanical falls - ICH -  Fractures - Contusions - Soft tissue injury  X-ray of the hip, CT scan of the head, C-spine and x-ray of the chest ordered.  I independently interpreted patient's x-ray of the hip, CT scan of the brain.  There is no evidence of brain bleed.  X-ray of the hip concerning for questionable fracture.  I reassessed the patient, his initial exam was reassuring.  Patient able to   flex his hip without any difficulty on both sides.  He is also able to raise his leg and also pushed down with both of his legs without any difficulty.  Clinically patient does not have a hip fracture.  Final Clinical Impression(s) / ED Diagnoses Final diagnoses:  Fall, initial encounter    Rx / DC Orders ED Discharge Orders     None         Derwood Kaplan, MD 07/08/23 0010

## 2023-07-08 NOTE — Discharge Instructions (Signed)
We saw you in the ER after you had a fall. All the imaging results are normal, no fractures seen. No evidence of brain bleed. Please be very careful with walking, and do everything possible to prevent falls.

## 2023-07-08 NOTE — ED Notes (Signed)
RN attempted to call report twice, facility is not answering the phone
